# Patient Record
Sex: Male | Born: 1960 | Race: White | Hispanic: No | Smoking: Former smoker
Health system: Southern US, Community
[De-identification: ages and names within clinical notes are randomized; demographics above are authoritative.]

## PROBLEM LIST (undated history)

## (undated) DIAGNOSIS — I509 Heart failure, unspecified: Secondary | ICD-10-CM

## (undated) DIAGNOSIS — G459 Transient cerebral ischemic attack, unspecified: Secondary | ICD-10-CM

## (undated) DIAGNOSIS — I219 Acute myocardial infarction, unspecified: Secondary | ICD-10-CM

## (undated) DIAGNOSIS — G629 Polyneuropathy, unspecified: Secondary | ICD-10-CM

## (undated) DIAGNOSIS — J449 Chronic obstructive pulmonary disease, unspecified: Secondary | ICD-10-CM

## (undated) DIAGNOSIS — E1351 Other specified diabetes mellitus with diabetic peripheral angiopathy without gangrene: Secondary | ICD-10-CM

## (undated) DIAGNOSIS — G4733 Obstructive sleep apnea (adult) (pediatric): Secondary | ICD-10-CM

## (undated) DIAGNOSIS — R161 Splenomegaly, not elsewhere classified: Secondary | ICD-10-CM

## (undated) DIAGNOSIS — F329 Major depressive disorder, single episode, unspecified: Secondary | ICD-10-CM

## (undated) DIAGNOSIS — F32A Depression, unspecified: Secondary | ICD-10-CM

## (undated) DIAGNOSIS — M316 Other giant cell arteritis: Secondary | ICD-10-CM

## (undated) DIAGNOSIS — E1140 Type 2 diabetes mellitus with diabetic neuropathy, unspecified: Secondary | ICD-10-CM

## (undated) DIAGNOSIS — K7460 Unspecified cirrhosis of liver: Secondary | ICD-10-CM

## (undated) HISTORY — PX: AORTA - FEMORAL ARTERY BYPASS GRAFT: SUR173

## (undated) HISTORY — PX: CARDIAC CATHETERIZATION: SHX172

## (undated) SURGERY — Surgical Case
Anesthesia: *Unknown

---

## 1998-09-25 ENCOUNTER — Observation Stay: Admission: RE | Admit: 1998-09-25 | Discharge: 1998-09-26 | Payer: Self-pay | Admitting: *Deleted

## 2004-10-19 ENCOUNTER — Emergency Department: Payer: Self-pay | Admitting: Emergency Medicine

## 2005-07-06 ENCOUNTER — Other Ambulatory Visit: Payer: Self-pay

## 2005-07-06 ENCOUNTER — Inpatient Hospital Stay: Payer: Self-pay | Admitting: Internal Medicine

## 2005-10-26 ENCOUNTER — Encounter: Payer: Self-pay | Admitting: Internal Medicine

## 2005-11-10 ENCOUNTER — Encounter: Payer: Self-pay | Admitting: Internal Medicine

## 2006-01-26 ENCOUNTER — Emergency Department: Payer: Self-pay

## 2006-01-26 ENCOUNTER — Other Ambulatory Visit: Payer: Self-pay

## 2006-02-10 ENCOUNTER — Emergency Department: Payer: Self-pay | Admitting: Internal Medicine

## 2006-03-05 ENCOUNTER — Emergency Department: Payer: Self-pay | Admitting: Emergency Medicine

## 2006-03-18 ENCOUNTER — Ambulatory Visit: Payer: Self-pay | Admitting: Emergency Medicine

## 2006-09-15 ENCOUNTER — Emergency Department: Payer: Self-pay | Admitting: Emergency Medicine

## 2006-09-27 ENCOUNTER — Ambulatory Visit: Payer: Self-pay | Admitting: Family Medicine

## 2006-10-10 ENCOUNTER — Ambulatory Visit: Payer: Self-pay | Admitting: Family Medicine

## 2007-09-27 ENCOUNTER — Emergency Department: Payer: Self-pay | Admitting: Emergency Medicine

## 2007-11-05 ENCOUNTER — Other Ambulatory Visit: Payer: Self-pay

## 2007-11-06 ENCOUNTER — Inpatient Hospital Stay: Payer: Self-pay | Admitting: Internal Medicine

## 2008-03-27 ENCOUNTER — Ambulatory Visit: Payer: Self-pay | Admitting: Internal Medicine

## 2008-06-10 ENCOUNTER — Emergency Department: Payer: Self-pay

## 2008-10-14 ENCOUNTER — Emergency Department: Payer: Self-pay | Admitting: Emergency Medicine

## 2008-11-01 ENCOUNTER — Encounter: Payer: Self-pay | Admitting: Orthopedic Surgery

## 2008-11-10 ENCOUNTER — Encounter: Payer: Self-pay | Admitting: Orthopedic Surgery

## 2009-09-15 ENCOUNTER — Emergency Department: Payer: Self-pay | Admitting: Emergency Medicine

## 2009-10-17 ENCOUNTER — Ambulatory Visit: Payer: Self-pay | Admitting: Internal Medicine

## 2009-12-20 ENCOUNTER — Inpatient Hospital Stay: Payer: Self-pay | Admitting: Internal Medicine

## 2010-03-12 ENCOUNTER — Observation Stay: Payer: Self-pay | Admitting: Internal Medicine

## 2010-11-26 ENCOUNTER — Emergency Department: Payer: Self-pay | Admitting: Emergency Medicine

## 2010-12-02 ENCOUNTER — Emergency Department: Payer: Self-pay | Admitting: Emergency Medicine

## 2010-12-04 ENCOUNTER — Emergency Department: Payer: Self-pay | Admitting: Emergency Medicine

## 2010-12-25 ENCOUNTER — Emergency Department: Payer: Self-pay | Admitting: Unknown Physician Specialty

## 2011-01-30 ENCOUNTER — Emergency Department: Payer: Self-pay | Admitting: Emergency Medicine

## 2011-05-07 ENCOUNTER — Ambulatory Visit: Payer: Self-pay | Admitting: "Endocrinology

## 2011-05-21 ENCOUNTER — Emergency Department: Payer: Self-pay | Admitting: Emergency Medicine

## 2011-07-03 ENCOUNTER — Emergency Department: Payer: Self-pay | Admitting: Emergency Medicine

## 2011-08-12 ENCOUNTER — Observation Stay: Payer: Self-pay | Admitting: Internal Medicine

## 2011-08-12 LAB — CBC
HCT: 43.7 % (ref 40.0–52.0)
HGB: 15.3 g/dL (ref 13.0–18.0)
MCH: 32.8 pg (ref 26.0–34.0)
MCHC: 35 g/dL (ref 32.0–36.0)
MCV: 94 fL (ref 80–100)
RDW: 14.5 % (ref 11.5–14.5)

## 2011-08-12 LAB — CK TOTAL AND CKMB (NOT AT ARMC)
CK, Total: 44 U/L (ref 35–232)
CK-MB: <0.5 ng/mL — ABNORMAL LOW (ref 0.5–3.6)
CK-MB: <0.5 ng/mL — ABNORMAL LOW (ref 0.5–3.6)

## 2011-08-12 LAB — COMPREHENSIVE METABOLIC PANEL
Albumin: 4 g/dL (ref 3.4–5.0)
Alkaline Phosphatase: 84 U/L (ref 50–136)
Anion Gap: 10 (ref 7–16)
BUN: 20 mg/dL — ABNORMAL HIGH (ref 7–18)
Calcium, Total: 9.7 mg/dL (ref 8.5–10.1)
EGFR (Non-African Amer.): >60
Glucose: 289 mg/dL — ABNORMAL HIGH (ref 65–99)
Osmolality: 285 (ref 275–301)
Potassium: 4.1 mmol/L (ref 3.5–5.1)
SGOT(AST): 98 U/L — ABNORMAL HIGH (ref 15–37)
Sodium: 136 mmol/L (ref 136–145)
Total Protein: 8.3 g/dL — ABNORMAL HIGH (ref 6.4–8.2)

## 2011-08-12 LAB — URINALYSIS, COMPLETE
Bacteria: NONE SEEN
Bilirubin,UR: NEGATIVE
Blood: NEGATIVE
Glucose,UR: >=500 mg/dL (ref 0–75)
Hyaline Cast: 1
Ketone: NEGATIVE
Leukocyte Esterase: NEGATIVE
Specific Gravity: 1.028 (ref 1.003–1.030)
Squamous Epithelial: <1
WBC UR: 1 /HPF (ref 0–5)

## 2011-08-12 LAB — TROPONIN I: Troponin-I: <0.02 ng/mL

## 2011-08-13 LAB — LIPID PANEL
Cholesterol: 137 mg/dL (ref 0–200)
HDL Cholesterol: 21 mg/dL — ABNORMAL LOW (ref 40–60)
Triglycerides: 362 mg/dL — ABNORMAL HIGH (ref 0–200)

## 2011-08-13 LAB — CBC WITH DIFFERENTIAL/PLATELET
Basophil %: 0.7 %
Eosinophil #: 0.5 10*3/uL (ref 0.0–0.7)
Eosinophil %: 7.4 %
Lymphocyte %: 23.7 %
MCV: 95 fL (ref 80–100)
Monocyte %: 8.8 %
Neutrophil #: 4.2 10*3/uL (ref 1.4–6.5)
Neutrophil %: 59.4 %
RBC: 4.34 10*6/uL — ABNORMAL LOW (ref 4.40–5.90)
RDW: 14.8 % — ABNORMAL HIGH (ref 11.5–14.5)
WBC: 7 10*3/uL (ref 3.8–10.6)

## 2011-08-13 LAB — BASIC METABOLIC PANEL
Anion Gap: 10 (ref 7–16)
BUN: 21 mg/dL — ABNORMAL HIGH (ref 7–18)
Calcium, Total: 9 mg/dL (ref 8.5–10.1)
Chloride: 103 mmol/L (ref 98–107)
Creatinine: 1.27 mg/dL (ref 0.60–1.30)
Glucose: 142 mg/dL — ABNORMAL HIGH (ref 65–99)
Osmolality: 283 (ref 275–301)
Potassium: 4.1 mmol/L (ref 3.5–5.1)

## 2011-08-13 LAB — TROPONIN I: Troponin-I: <0.02 ng/mL

## 2011-08-13 LAB — MAGNESIUM: Magnesium: 1.8 mg/dL

## 2011-08-13 LAB — HEMOGLOBIN A1C: Hemoglobin A1C: 7.2 % — ABNORMAL HIGH (ref 4.2–6.3)

## 2011-11-06 ENCOUNTER — Emergency Department: Payer: Self-pay | Admitting: Emergency Medicine

## 2011-11-06 LAB — COMPREHENSIVE METABOLIC PANEL
Albumin: 3.9 g/dL (ref 3.4–5.0)
Alkaline Phosphatase: 81 U/L (ref 50–136)
Anion Gap: 13 (ref 7–16)
BUN: 28 mg/dL — ABNORMAL HIGH (ref 7–18)
Bilirubin,Total: 0.6 mg/dL (ref 0.2–1.0)
Chloride: 103 mmol/L (ref 98–107)
Co2: 22 mmol/L (ref 21–32)
Creatinine: 1.38 mg/dL — ABNORMAL HIGH (ref 0.60–1.30)
EGFR (African American): >60
Glucose: 246 mg/dL — ABNORMAL HIGH (ref 65–99)
Osmolality: 289 (ref 275–301)
SGOT(AST): 65 U/L — ABNORMAL HIGH (ref 15–37)
SGPT (ALT): 87 U/L — ABNORMAL HIGH
Sodium: 138 mmol/L (ref 136–145)

## 2011-11-06 LAB — CK TOTAL AND CKMB (NOT AT ARMC)
CK, Total: 31 U/L — ABNORMAL LOW (ref 35–232)
CK-MB: <0.5 ng/mL — ABNORMAL LOW (ref 0.5–3.6)

## 2011-11-06 LAB — CBC
HCT: 41.3 % (ref 40.0–52.0)
HGB: 14.2 g/dL (ref 13.0–18.0)
MCH: 32.7 pg (ref 26.0–34.0)
MCV: 95 fL (ref 80–100)
RBC: 4.34 10*6/uL — ABNORMAL LOW (ref 4.40–5.90)
RDW: 15 % — ABNORMAL HIGH (ref 11.5–14.5)
WBC: 4.7 10*3/uL (ref 3.8–10.6)

## 2011-11-06 LAB — TROPONIN I
Troponin-I: <0.02 ng/mL
Troponin-I: <0.02 ng/mL

## 2011-11-19 ENCOUNTER — Emergency Department: Payer: Self-pay | Admitting: Emergency Medicine

## 2011-11-19 LAB — BASIC METABOLIC PANEL
Anion Gap: 9 (ref 7–16)
BUN: 21 mg/dL — ABNORMAL HIGH (ref 7–18)
Chloride: 104 mmol/L (ref 98–107)
EGFR (African American): >60
EGFR (Non-African Amer.): >60
Glucose: 290 mg/dL — ABNORMAL HIGH (ref 65–99)
Osmolality: 289 (ref 275–301)
Potassium: 4.4 mmol/L (ref 3.5–5.1)
Sodium: 138 mmol/L (ref 136–145)

## 2011-11-19 LAB — CBC
HCT: 39.2 % — ABNORMAL LOW (ref 40.0–52.0)
HGB: 13.8 g/dL (ref 13.0–18.0)
MCH: 33.2 pg (ref 26.0–34.0)
MCHC: 35.3 g/dL (ref 32.0–36.0)
MCV: 94 fL (ref 80–100)
Platelet: 133 10*3/uL — ABNORMAL LOW (ref 150–440)
WBC: 6.8 10*3/uL (ref 3.8–10.6)

## 2011-11-19 LAB — TROPONIN I: Troponin-I: <0.02 ng/mL

## 2012-01-22 ENCOUNTER — Emergency Department: Payer: Self-pay | Admitting: Emergency Medicine

## 2012-01-22 LAB — BASIC METABOLIC PANEL
Anion Gap: 10 (ref 7–16)
BUN: 20 mg/dL — ABNORMAL HIGH (ref 7–18)
Calcium, Total: 9.5 mg/dL (ref 8.5–10.1)
Chloride: 103 mmol/L (ref 98–107)
Co2: 26 mmol/L (ref 21–32)
EGFR (African American): >60
EGFR (Non-African Amer.): >60
Glucose: 204 mg/dL — ABNORMAL HIGH (ref 65–99)
Osmolality: 286 (ref 275–301)
Sodium: 139 mmol/L (ref 136–145)

## 2012-01-22 LAB — CBC
HCT: 40.9 % (ref 40.0–52.0)
MCHC: 35.3 g/dL (ref 32.0–36.0)
MCV: 93 fL (ref 80–100)
Platelet: 147 10*3/uL — ABNORMAL LOW (ref 150–440)
RBC: 4.39 10*6/uL — ABNORMAL LOW (ref 4.40–5.90)
RDW: 14.7 % — ABNORMAL HIGH (ref 11.5–14.5)
WBC: 6.5 10*3/uL (ref 3.8–10.6)

## 2012-01-22 LAB — CK TOTAL AND CKMB (NOT AT ARMC): CK, Total: 41 U/L (ref 35–232)

## 2012-08-03 ENCOUNTER — Emergency Department: Payer: Self-pay | Admitting: Unknown Physician Specialty

## 2012-08-03 LAB — BASIC METABOLIC PANEL
Anion Gap: 11 (ref 7–16)
BUN: 23 mg/dL — ABNORMAL HIGH (ref 7–18)
Calcium, Total: 9.3 mg/dL (ref 8.5–10.1)
Co2: 23 mmol/L (ref 21–32)
Creatinine: 1.31 mg/dL — ABNORMAL HIGH (ref 0.60–1.30)
EGFR (African American): >60
EGFR (Non-African Amer.): >60
Osmolality: 286 (ref 275–301)
Sodium: 139 mmol/L (ref 136–145)

## 2012-08-03 LAB — CBC
HGB: 14.1 g/dL (ref 13.0–18.0)
MCH: 32.5 pg (ref 26.0–34.0)
MCV: 93 fL (ref 80–100)
Platelet: 136 10*3/uL — ABNORMAL LOW (ref 150–440)
RBC: 4.33 10*6/uL — ABNORMAL LOW (ref 4.40–5.90)
RDW: 14.9 % — ABNORMAL HIGH (ref 11.5–14.5)

## 2012-08-03 LAB — TROPONIN I: Troponin-I: <0.02 ng/mL

## 2012-08-03 LAB — CK TOTAL AND CKMB (NOT AT ARMC)
CK, Total: 58 U/L (ref 35–232)
CK-MB: <0.5 ng/mL — ABNORMAL LOW (ref 0.5–3.6)

## 2012-08-04 LAB — HEPATIC FUNCTION PANEL A (ARMC)
Albumin: 4.2 g/dL (ref 3.4–5.0)
Alkaline Phosphatase: 100 U/L (ref 50–136)
Bilirubin, Direct: 0.2 mg/dL (ref 0.00–0.20)
Bilirubin,Total: 0.8 mg/dL (ref 0.2–1.0)
SGPT (ALT): 74 U/L (ref 12–78)

## 2012-08-04 LAB — MAGNESIUM: Magnesium: 1.6 mg/dL — ABNORMAL LOW

## 2012-08-04 LAB — LIPASE, BLOOD: Lipase: 438 U/L — ABNORMAL HIGH (ref 73–393)

## 2013-04-12 ENCOUNTER — Emergency Department: Payer: Self-pay | Admitting: Emergency Medicine

## 2013-04-12 LAB — PROTIME-INR: Prothrombin Time: 13.5 secs (ref 11.5–14.7)

## 2013-04-12 LAB — TROPONIN I: Troponin-I: <0.02 ng/mL

## 2013-04-12 LAB — BASIC METABOLIC PANEL
BUN: 16 mg/dL (ref 7–18)
Chloride: 102 mmol/L (ref 98–107)
Co2: 24 mmol/L (ref 21–32)
EGFR (African American): >60
EGFR (Non-African Amer.): >60
Glucose: 293 mg/dL — ABNORMAL HIGH (ref 65–99)
Osmolality: 280 (ref 275–301)

## 2013-04-12 LAB — CBC
HCT: 41.7 % (ref 40.0–52.0)
HGB: 14.7 g/dL (ref 13.0–18.0)
MCH: 32.5 pg (ref 26.0–34.0)
MCHC: 35.2 g/dL (ref 32.0–36.0)
Platelet: 135 10*3/uL — ABNORMAL LOW (ref 150–440)
RBC: 4.51 10*6/uL (ref 4.40–5.90)
RDW: 14.9 % — ABNORMAL HIGH (ref 11.5–14.5)
WBC: 5.9 10*3/uL (ref 3.8–10.6)

## 2013-04-12 LAB — CK TOTAL AND CKMB (NOT AT ARMC)
CK, Total: 45 U/L (ref 35–232)
CK-MB: <0.5 ng/mL — ABNORMAL LOW (ref 0.5–3.6)

## 2013-04-12 LAB — PRO B NATRIURETIC PEPTIDE: B-Type Natriuretic Peptide: 64 pg/mL (ref 0–125)

## 2013-05-11 ENCOUNTER — Emergency Department: Payer: Self-pay | Admitting: Emergency Medicine

## 2013-05-11 LAB — COMPREHENSIVE METABOLIC PANEL
Alkaline Phosphatase: 143 U/L — ABNORMAL HIGH (ref 50–136)
BUN: 14 mg/dL (ref 7–18)
Bilirubin,Total: 1.2 mg/dL — ABNORMAL HIGH (ref 0.2–1.0)
Calcium, Total: 8.8 mg/dL (ref 8.5–10.1)
Co2: 24 mmol/L (ref 21–32)
EGFR (African American): >60
Glucose: 540 mg/dL (ref 65–99)
Potassium: 4 mmol/L (ref 3.5–5.1)
SGOT(AST): 65 U/L — ABNORMAL HIGH (ref 15–37)
SGPT (ALT): 96 U/L — ABNORMAL HIGH (ref 12–78)
Total Protein: 6.9 g/dL (ref 6.4–8.2)

## 2013-05-11 LAB — LIPASE, BLOOD: Lipase: 489 U/L — ABNORMAL HIGH (ref 73–393)

## 2013-05-11 LAB — CBC
HCT: 40 % (ref 40.0–52.0)
HGB: 14.5 g/dL (ref 13.0–18.0)
MCH: 33.6 pg (ref 26.0–34.0)
MCV: 93 fL (ref 80–100)
Platelet: 117 10*3/uL — ABNORMAL LOW (ref 150–440)
RBC: 4.31 10*6/uL — ABNORMAL LOW (ref 4.40–5.90)
RDW: 14.6 % — ABNORMAL HIGH (ref 11.5–14.5)

## 2013-05-11 LAB — URINALYSIS, COMPLETE
Blood: NEGATIVE
Glucose,UR: >=500 mg/dL (ref 0–75)
Ketone: NEGATIVE
Leukocyte Esterase: NEGATIVE
Protein: 30
RBC,UR: <1 /HPF (ref 0–5)
Squamous Epithelial: NONE SEEN
WBC UR: <1 /HPF (ref 0–5)

## 2013-07-23 ENCOUNTER — Emergency Department: Payer: Self-pay | Admitting: Emergency Medicine

## 2013-07-23 LAB — CBC
HCT: 41.7 % (ref 40.0–52.0)
HGB: 15 g/dL (ref 13.0–18.0)
MCH: 33.9 pg (ref 26.0–34.0)
MCHC: 36 g/dL (ref 32.0–36.0)
MCV: 94 fL (ref 80–100)
PLATELETS: 132 10*3/uL — AB (ref 150–440)
RBC: 4.43 10*6/uL (ref 4.40–5.90)
RDW: 15 % — ABNORMAL HIGH (ref 11.5–14.5)
WBC: 6.1 10*3/uL (ref 3.8–10.6)

## 2013-07-23 LAB — BASIC METABOLIC PANEL
Anion Gap: 4 — ABNORMAL LOW (ref 7–16)
BUN: 21 mg/dL — AB (ref 7–18)
Calcium, Total: 8.8 mg/dL (ref 8.5–10.1)
Chloride: 96 mmol/L — ABNORMAL LOW (ref 98–107)
Co2: 29 mmol/L (ref 21–32)
Creatinine: 1.3 mg/dL (ref 0.60–1.30)
EGFR (African American): >60
EGFR (Non-African Amer.): >60
Glucose: 444 mg/dL — ABNORMAL HIGH (ref 65–99)
OSMOLALITY: 281 (ref 275–301)
Potassium: 4.6 mmol/L (ref 3.5–5.1)
Sodium: 129 mmol/L — ABNORMAL LOW (ref 136–145)

## 2013-07-23 LAB — TROPONIN I

## 2013-10-02 LAB — BASIC METABOLIC PANEL
ANION GAP: 9 (ref 7–16)
BUN: 17 mg/dL (ref 7–18)
CHLORIDE: 98 mmol/L (ref 98–107)
CO2: 26 mmol/L (ref 21–32)
Calcium, Total: 9.1 mg/dL (ref 8.5–10.1)
Creatinine: 1.47 mg/dL — ABNORMAL HIGH (ref 0.60–1.30)
EGFR (African American): >60
EGFR (Non-African Amer.): 53 — ABNORMAL LOW
Glucose: 428 mg/dL — ABNORMAL HIGH (ref 65–99)
Osmolality: 286 (ref 275–301)
Potassium: 4.1 mmol/L (ref 3.5–5.1)
Sodium: 133 mmol/L — ABNORMAL LOW (ref 136–145)

## 2013-10-02 LAB — CBC
HCT: 43.2 % (ref 40.0–52.0)
HGB: 15 g/dL (ref 13.0–18.0)
MCH: 33.5 pg (ref 26.0–34.0)
MCHC: 34.7 g/dL (ref 32.0–36.0)
MCV: 96 fL (ref 80–100)
Platelet: 146 10*3/uL — ABNORMAL LOW (ref 150–440)
RBC: 4.48 10*6/uL (ref 4.40–5.90)
RDW: 15.8 % — ABNORMAL HIGH (ref 11.5–14.5)
WBC: 5.9 10*3/uL (ref 3.8–10.6)

## 2013-10-02 LAB — TROPONIN I: Troponin-I: <0.02 ng/mL

## 2013-10-03 ENCOUNTER — Observation Stay: Payer: Self-pay | Admitting: Internal Medicine

## 2013-10-03 LAB — CBC WITH DIFFERENTIAL/PLATELET
Basophil #: 0.1 10*3/uL (ref 0.0–0.1)
Basophil %: 0.8 %
Eosinophil #: 0.2 10*3/uL (ref 0.0–0.7)
Eosinophil %: 3.4 %
HCT: 41.1 % (ref 40.0–52.0)
HGB: 14.6 g/dL (ref 13.0–18.0)
Lymphocyte #: 1.7 10*3/uL (ref 1.0–3.6)
Lymphocyte %: 25.3 %
MCH: 33.8 pg (ref 26.0–34.0)
MCHC: 35.4 g/dL (ref 32.0–36.0)
MCV: 95 fL (ref 80–100)
MONO ABS: 0.5 x10 3/mm (ref 0.2–1.0)
MONOS PCT: 7.5 %
Neutrophil #: 4.2 10*3/uL (ref 1.4–6.5)
Neutrophil %: 63 %
PLATELETS: 141 10*3/uL — AB (ref 150–440)
RBC: 4.31 10*6/uL — AB (ref 4.40–5.90)
RDW: 15.4 % — AB (ref 11.5–14.5)
WBC: 6.6 10*3/uL (ref 3.8–10.6)

## 2013-10-03 LAB — CK-MB
CK-MB: <0.5 ng/mL — ABNORMAL LOW (ref 0.5–3.6)
CK-MB: <0.5 ng/mL — ABNORMAL LOW (ref 0.5–3.6)
CK-MB: <0.5 ng/mL — ABNORMAL LOW (ref 0.5–3.6)

## 2013-10-03 LAB — TSH: Thyroid Stimulating Horm: 1.24 u[IU]/mL

## 2013-10-03 LAB — MAGNESIUM
MAGNESIUM: 1.6 mg/dL — AB
Magnesium: 1.5 mg/dL — ABNORMAL LOW

## 2013-10-03 LAB — HEMOGLOBIN A1C: Hemoglobin A1C: 7.5 % — ABNORMAL HIGH (ref 4.2–6.3)

## 2013-10-03 LAB — TROPONIN I

## 2013-11-05 ENCOUNTER — Emergency Department: Payer: Self-pay | Admitting: Emergency Medicine

## 2013-11-05 LAB — SEDIMENTATION RATE: ERYTHROCYTE SED RATE: 45 mm/h — AB (ref 0–20)

## 2013-12-14 ENCOUNTER — Inpatient Hospital Stay: Payer: Self-pay | Admitting: Internal Medicine

## 2013-12-14 LAB — CBC WITH DIFFERENTIAL/PLATELET
BASOS ABS: 0.1 10*3/uL (ref 0.0–0.1)
Basophil %: 0.5 %
EOS ABS: 0.3 10*3/uL (ref 0.0–0.7)
EOS PCT: 2.9 %
HCT: 43.7 % (ref 40.0–52.0)
HGB: 15 g/dL (ref 13.0–18.0)
LYMPHS ABS: 1.7 10*3/uL (ref 1.0–3.6)
Lymphocyte %: 15.9 %
MCH: 33 pg (ref 26.0–34.0)
MCHC: 34.4 g/dL (ref 32.0–36.0)
MCV: 96 fL (ref 80–100)
Monocyte #: 0.5 x10 3/mm (ref 0.2–1.0)
Monocyte %: 5 %
Neutrophil #: 8.3 10*3/uL — ABNORMAL HIGH (ref 1.4–6.5)
Neutrophil %: 75.7 %
PLATELETS: 132 10*3/uL — AB (ref 150–440)
RBC: 4.56 10*6/uL (ref 4.40–5.90)
RDW: 16.1 % — ABNORMAL HIGH (ref 11.5–14.5)
WBC: 11 10*3/uL — ABNORMAL HIGH (ref 3.8–10.6)

## 2013-12-14 LAB — BASIC METABOLIC PANEL
ANION GAP: 6 — AB (ref 7–16)
BUN: 30 mg/dL — ABNORMAL HIGH (ref 7–18)
CO2: 31 mmol/L (ref 21–32)
CREATININE: 1.21 mg/dL (ref 0.60–1.30)
Calcium, Total: 9.6 mg/dL (ref 8.5–10.1)
Chloride: 105 mmol/L (ref 98–107)
EGFR (Non-African Amer.): >60
Glucose: 106 mg/dL — ABNORMAL HIGH (ref 65–99)
Osmolality: 290 (ref 275–301)
Potassium: 4 mmol/L (ref 3.5–5.1)
Sodium: 142 mmol/L (ref 136–145)

## 2013-12-14 LAB — TROPONIN I
Troponin-I: <0.02 ng/mL
Troponin-I: <0.02 ng/mL

## 2013-12-14 LAB — PRO B NATRIURETIC PEPTIDE: B-Type Natriuretic Peptide: 212 pg/mL — ABNORMAL HIGH (ref 0–125)

## 2013-12-14 LAB — MAGNESIUM: Magnesium: 1.8 mg/dL

## 2013-12-15 LAB — BASIC METABOLIC PANEL
Anion Gap: 8 (ref 7–16)
BUN: 27 mg/dL — ABNORMAL HIGH (ref 7–18)
CHLORIDE: 100 mmol/L (ref 98–107)
CO2: 27 mmol/L (ref 21–32)
Calcium, Total: 9.1 mg/dL (ref 8.5–10.1)
Creatinine: 1.1 mg/dL (ref 0.60–1.30)
EGFR (African American): >60
Glucose: 255 mg/dL — ABNORMAL HIGH (ref 65–99)
Osmolality: 284 (ref 275–301)
POTASSIUM: 4.4 mmol/L (ref 3.5–5.1)
SODIUM: 135 mmol/L — AB (ref 136–145)

## 2013-12-15 LAB — T4, FREE: Free Thyroxine: 0.95 ng/dL (ref 0.76–1.46)

## 2013-12-15 LAB — HEMOGLOBIN A1C: Hemoglobin A1C: 8.3 % — ABNORMAL HIGH (ref 4.2–6.3)

## 2013-12-15 LAB — TSH: Thyroid Stimulating Horm: 0.176 u[IU]/mL — ABNORMAL LOW

## 2013-12-19 LAB — CULTURE, BLOOD (SINGLE)

## 2014-01-10 HISTORY — PX: OTHER SURGICAL HISTORY: SHX169

## 2014-02-06 ENCOUNTER — Emergency Department: Payer: Self-pay | Admitting: Emergency Medicine

## 2014-02-06 LAB — PRO B NATRIURETIC PEPTIDE: B-Type Natriuretic Peptide: 634 pg/mL — ABNORMAL HIGH (ref 0–125)

## 2014-02-06 LAB — URINALYSIS, COMPLETE
BILIRUBIN, UR: NEGATIVE
Bacteria: NONE SEEN
Blood: NEGATIVE
Glucose,UR: NEGATIVE mg/dL (ref 0–75)
Ketone: NEGATIVE
Leukocyte Esterase: NEGATIVE
Nitrite: NEGATIVE
PH: 6 (ref 4.5–8.0)
RBC,UR: NONE SEEN /HPF (ref 0–5)
SQUAMOUS EPITHELIAL: NONE SEEN
Specific Gravity: 1.016 (ref 1.003–1.030)
WBC UR: <1 /HPF (ref 0–5)

## 2014-02-06 LAB — BASIC METABOLIC PANEL
Anion Gap: 10 (ref 7–16)
BUN: 28 mg/dL — ABNORMAL HIGH (ref 7–18)
CALCIUM: 9.1 mg/dL (ref 8.5–10.1)
CHLORIDE: 101 mmol/L (ref 98–107)
CO2: 26 mmol/L (ref 21–32)
Creatinine: 1.01 mg/dL (ref 0.60–1.30)
EGFR (Non-African Amer.): >60
Glucose: 78 mg/dL (ref 65–99)
Osmolality: 278 (ref 275–301)
POTASSIUM: 4.2 mmol/L (ref 3.5–5.1)
Sodium: 137 mmol/L (ref 136–145)

## 2014-02-06 LAB — CBC
HCT: 37.4 % — AB (ref 40.0–52.0)
HGB: 12.9 g/dL — ABNORMAL LOW (ref 13.0–18.0)
MCH: 33.5 pg (ref 26.0–34.0)
MCHC: 34.6 g/dL (ref 32.0–36.0)
MCV: 97 fL (ref 80–100)
Platelet: 136 10*3/uL — ABNORMAL LOW (ref 150–440)
RBC: 3.87 10*6/uL — ABNORMAL LOW (ref 4.40–5.90)
RDW: 17.2 % — AB (ref 11.5–14.5)
WBC: 10.8 10*3/uL — ABNORMAL HIGH (ref 3.8–10.6)

## 2014-02-06 LAB — TROPONIN I

## 2014-02-17 ENCOUNTER — Observation Stay: Payer: Self-pay | Admitting: Internal Medicine

## 2014-02-17 LAB — BASIC METABOLIC PANEL
Anion Gap: 11 (ref 7–16)
BUN: 28 mg/dL — AB (ref 7–18)
CHLORIDE: 105 mmol/L (ref 98–107)
Calcium, Total: 9.3 mg/dL (ref 8.5–10.1)
Co2: 24 mmol/L (ref 21–32)
Creatinine: 1.22 mg/dL (ref 0.60–1.30)
EGFR (Non-African Amer.): >60
Glucose: 137 mg/dL — ABNORMAL HIGH (ref 65–99)
OSMOLALITY: 287 (ref 275–301)
Potassium: 3.6 mmol/L (ref 3.5–5.1)
SODIUM: 140 mmol/L (ref 136–145)

## 2014-02-17 LAB — HEMOGLOBIN
HGB: 9.8 g/dL — ABNORMAL LOW (ref 13.0–18.0)
HGB: 10.2 g/dL — ABNORMAL LOW (ref 13.0–18.0)

## 2014-02-17 LAB — CBC
HCT: 31.6 % — ABNORMAL LOW (ref 40.0–52.0)
HGB: 10.4 g/dL — ABNORMAL LOW (ref 13.0–18.0)
MCH: 32.4 pg (ref 26.0–34.0)
MCHC: 33 g/dL (ref 32.0–36.0)
MCV: 98 fL (ref 80–100)
PLATELETS: 134 10*3/uL — AB (ref 150–440)
RBC: 3.22 10*6/uL — AB (ref 4.40–5.90)
RDW: 18.3 % — AB (ref 11.5–14.5)
WBC: 7.4 10*3/uL (ref 3.8–10.6)

## 2014-02-17 LAB — TROPONIN I: Troponin-I: <0.02 ng/mL

## 2014-02-18 LAB — CBC WITH DIFFERENTIAL/PLATELET
BASOS ABS: 0 10*3/uL (ref 0.0–0.1)
BASOS PCT: 0.1 %
Eosinophil #: 0 10*3/uL (ref 0.0–0.7)
Eosinophil %: 0.1 %
HCT: 30.8 % — AB (ref 40.0–52.0)
HGB: 10.5 g/dL — ABNORMAL LOW (ref 13.0–18.0)
LYMPHS ABS: 0.5 10*3/uL — AB (ref 1.0–3.6)
Lymphocyte %: 6.7 %
MCH: 33 pg (ref 26.0–34.0)
MCHC: 34.1 g/dL (ref 32.0–36.0)
MCV: 97 fL (ref 80–100)
Monocyte #: 0.3 x10 3/mm (ref 0.2–1.0)
Monocyte %: 4.8 %
NEUTROS PCT: 88.3 %
Neutrophil #: 6.3 10*3/uL (ref 1.4–6.5)
PLATELETS: 116 10*3/uL — AB (ref 150–440)
RBC: 3.18 10*6/uL — ABNORMAL LOW (ref 4.40–5.90)
RDW: 18.4 % — ABNORMAL HIGH (ref 11.5–14.5)
WBC: 7.1 10*3/uL (ref 3.8–10.6)

## 2014-02-18 LAB — BASIC METABOLIC PANEL
Anion Gap: 11 (ref 7–16)
BUN: 21 mg/dL — ABNORMAL HIGH (ref 7–18)
CALCIUM: 9.3 mg/dL (ref 8.5–10.1)
Chloride: 102 mmol/L (ref 98–107)
Co2: 27 mmol/L (ref 21–32)
Creatinine: 1.12 mg/dL (ref 0.60–1.30)
EGFR (African American): >60
Glucose: 135 mg/dL — ABNORMAL HIGH (ref 65–99)
OSMOLALITY: 284 (ref 275–301)
Potassium: 4.3 mmol/L (ref 3.5–5.1)
SODIUM: 140 mmol/L (ref 136–145)

## 2014-03-15 ENCOUNTER — Emergency Department: Payer: Self-pay | Admitting: Emergency Medicine

## 2014-03-15 LAB — BASIC METABOLIC PANEL
ANION GAP: 10 (ref 7–16)
BUN: 13 mg/dL (ref 7–18)
CALCIUM: 9 mg/dL (ref 8.5–10.1)
CO2: 23 mmol/L (ref 21–32)
CREATININE: 1.36 mg/dL — AB (ref 0.60–1.30)
Chloride: 105 mmol/L (ref 98–107)
EGFR (Non-African Amer.): 58 — ABNORMAL LOW
GLUCOSE: 269 mg/dL — AB (ref 65–99)
Osmolality: 285 (ref 275–301)
Potassium: 4.5 mmol/L (ref 3.5–5.1)
Sodium: 138 mmol/L (ref 136–145)

## 2014-03-15 LAB — CBC
HCT: 37.8 % — AB (ref 40.0–52.0)
HGB: 12.4 g/dL — ABNORMAL LOW (ref 13.0–18.0)
MCH: 31.2 pg (ref 26.0–34.0)
MCHC: 32.9 g/dL (ref 32.0–36.0)
MCV: 95 fL (ref 80–100)
PLATELETS: 166 10*3/uL (ref 150–440)
RBC: 3.98 10*6/uL — ABNORMAL LOW (ref 4.40–5.90)
RDW: 17.1 % — ABNORMAL HIGH (ref 11.5–14.5)
WBC: 9 10*3/uL (ref 3.8–10.6)

## 2014-03-15 LAB — PRO B NATRIURETIC PEPTIDE: B-Type Natriuretic Peptide: 530 pg/mL — ABNORMAL HIGH (ref 0–125)

## 2014-03-15 LAB — TROPONIN I: Troponin-I: 0.02 ng/mL

## 2014-04-21 ENCOUNTER — Emergency Department: Payer: Self-pay | Admitting: Emergency Medicine

## 2014-04-29 ENCOUNTER — Emergency Department: Payer: Self-pay | Admitting: Emergency Medicine

## 2014-04-29 LAB — CBC
HCT: 36.9 % — ABNORMAL LOW (ref 40.0–52.0)
HGB: 12.5 g/dL — AB (ref 13.0–18.0)
MCH: 31.1 pg (ref 26.0–34.0)
MCHC: 33.9 g/dL (ref 32.0–36.0)
MCV: 92 fL (ref 80–100)
PLATELETS: 163 10*3/uL (ref 150–440)
RBC: 4.01 10*6/uL — ABNORMAL LOW (ref 4.40–5.90)
RDW: 16 % — ABNORMAL HIGH (ref 11.5–14.5)
WBC: 11 10*3/uL — ABNORMAL HIGH (ref 3.8–10.6)

## 2014-04-29 LAB — COMPREHENSIVE METABOLIC PANEL
ALBUMIN: 3.3 g/dL — AB (ref 3.4–5.0)
ALK PHOS: 73 U/L
Anion Gap: 11 (ref 7–16)
BUN: 22 mg/dL — ABNORMAL HIGH (ref 7–18)
Bilirubin,Total: 1.2 mg/dL — ABNORMAL HIGH (ref 0.2–1.0)
Calcium, Total: 8.7 mg/dL (ref 8.5–10.1)
Chloride: 103 mmol/L (ref 98–107)
Co2: 24 mmol/L (ref 21–32)
Creatinine: 1.39 mg/dL — ABNORMAL HIGH (ref 0.60–1.30)
EGFR (Non-African Amer.): 56 — ABNORMAL LOW
Glucose: 299 mg/dL — ABNORMAL HIGH (ref 65–99)
Osmolality: 290 (ref 275–301)
Potassium: 4.1 mmol/L (ref 3.5–5.1)
SGOT(AST): 24 U/L (ref 15–37)
SGPT (ALT): 45 U/L
Sodium: 138 mmol/L (ref 136–145)
Total Protein: 6.7 g/dL (ref 6.4–8.2)

## 2014-04-29 LAB — TROPONIN I

## 2014-04-30 LAB — URINALYSIS, COMPLETE
BACTERIA: NONE SEEN
BILIRUBIN, UR: NEGATIVE
BLOOD: NEGATIVE
Glucose,UR: >=500 mg/dL (ref 0–75)
Ketone: NEGATIVE
Leukocyte Esterase: NEGATIVE
Nitrite: NEGATIVE
Ph: 6 (ref 4.5–8.0)
RBC,UR: NONE SEEN /HPF (ref 0–5)
SQUAMOUS EPITHELIAL: NONE SEEN
Specific Gravity: 1.016 (ref 1.003–1.030)

## 2014-04-30 LAB — ETHANOL: Ethanol: <3 mg/dL (ref 0–80)

## 2014-06-23 ENCOUNTER — Emergency Department: Payer: Self-pay | Admitting: Emergency Medicine

## 2014-06-23 LAB — BASIC METABOLIC PANEL
Anion Gap: 9 (ref 7–16)
BUN: 15 mg/dL (ref 7–18)
CALCIUM: 9.4 mg/dL (ref 8.5–10.1)
CHLORIDE: 103 mmol/L (ref 98–107)
Co2: 26 mmol/L (ref 21–32)
Creatinine: 1.31 mg/dL — ABNORMAL HIGH (ref 0.60–1.30)
EGFR (African American): >60
EGFR (Non-African Amer.): >60
Glucose: 350 mg/dL — ABNORMAL HIGH (ref 65–99)
OSMOLALITY: 290 (ref 275–301)
Potassium: 4.2 mmol/L (ref 3.5–5.1)
SODIUM: 138 mmol/L (ref 136–145)

## 2014-06-23 LAB — CBC WITH DIFFERENTIAL/PLATELET
Basophil #: 0.1 10*3/uL (ref 0.0–0.1)
Basophil %: 0.6 %
Eosinophil #: 0.2 10*3/uL (ref 0.0–0.7)
Eosinophil %: 1.8 %
HCT: 41.7 % (ref 40.0–52.0)
HGB: 13.9 g/dL (ref 13.0–18.0)
LYMPHS PCT: 12.7 %
Lymphocyte #: 1.2 10*3/uL (ref 1.0–3.6)
MCH: 31.6 pg (ref 26.0–34.0)
MCHC: 33.3 g/dL (ref 32.0–36.0)
MCV: 95 fL (ref 80–100)
MONO ABS: 0.6 x10 3/mm (ref 0.2–1.0)
MONOS PCT: 6.7 %
Neutrophil #: 7.3 10*3/uL — ABNORMAL HIGH (ref 1.4–6.5)
Neutrophil %: 78.2 %
Platelet: 167 10*3/uL (ref 150–440)
RBC: 4.39 10*6/uL — AB (ref 4.40–5.90)
RDW: 16.5 % — ABNORMAL HIGH (ref 11.5–14.5)
WBC: 9.3 10*3/uL (ref 3.8–10.6)

## 2014-06-23 LAB — URINALYSIS, COMPLETE
BACTERIA: NONE SEEN
Bilirubin,UR: NEGATIVE
Blood: NEGATIVE
LEUKOCYTE ESTERASE: NEGATIVE
Nitrite: NEGATIVE
Ph: 5 (ref 4.5–8.0)
Protein: 100
RBC,UR: <1 /HPF (ref 0–5)
Specific Gravity: 1.032 (ref 1.003–1.030)
Squamous Epithelial: NONE SEEN
WBC UR: <1 /HPF (ref 0–5)

## 2014-06-23 LAB — TROPONIN I: Troponin-I: <0.02 ng/mL

## 2014-09-20 ENCOUNTER — Emergency Department: Payer: Self-pay | Admitting: Emergency Medicine

## 2014-10-12 DIAGNOSIS — G459 Transient cerebral ischemic attack, unspecified: Secondary | ICD-10-CM

## 2014-10-12 HISTORY — DX: Transient cerebral ischemic attack, unspecified: G45.9

## 2014-11-01 ENCOUNTER — Emergency Department
Admit: 2014-11-01 | Disposition: A | Discharge status: Home / Self Care | Payer: Self-pay | Admitting: Emergency Medicine

## 2014-11-01 LAB — URINALYSIS, COMPLETE
BILIRUBIN, UR: NEGATIVE
BLOOD: NEGATIVE
Bacteria: NONE SEEN
KETONE: NEGATIVE
Leukocyte Esterase: NEGATIVE
Nitrite: NEGATIVE
Ph: 5 (ref 4.5–8.0)
SPECIFIC GRAVITY: 1.022 (ref 1.003–1.030)
SQUAMOUS EPITHELIAL: NONE SEEN

## 2014-11-01 LAB — CBC WITH DIFFERENTIAL/PLATELET
Basophil #: 0.1 10*3/uL (ref 0.0–0.1)
Basophil %: 0.5 %
EOS PCT: 2.1 %
Eosinophil #: 0.3 10*3/uL (ref 0.0–0.7)
HCT: 43.8 % (ref 40.0–52.0)
HGB: 14.9 g/dL (ref 13.0–18.0)
LYMPHS ABS: 1.4 10*3/uL (ref 1.0–3.6)
Lymphocyte %: 11.4 %
MCH: 31.3 pg (ref 26.0–34.0)
MCHC: 34.1 g/dL (ref 32.0–36.0)
MCV: 92 fL (ref 80–100)
MONOS PCT: 7 %
Monocyte #: 0.8 x10 3/mm (ref 0.2–1.0)
NEUTROS PCT: 79 %
Neutrophil #: 9.4 10*3/uL — ABNORMAL HIGH (ref 1.4–6.5)
Platelet: 206 10*3/uL (ref 150–440)
RBC: 4.76 10*6/uL (ref 4.40–5.90)
RDW: 15.4 % — ABNORMAL HIGH (ref 11.5–14.5)
WBC: 11.9 10*3/uL — ABNORMAL HIGH (ref 3.8–10.6)

## 2014-11-01 LAB — BASIC METABOLIC PANEL
ANION GAP: 11 (ref 7–16)
BUN: 14 mg/dL
CALCIUM: 9.6 mg/dL
Chloride: 100 mmol/L — ABNORMAL LOW
Co2: 26 mmol/L
Creatinine: 1.09 mg/dL
EGFR (African American): >60
EGFR (Non-African Amer.): >60
Glucose: 250 mg/dL — ABNORMAL HIGH
Potassium: 3.7 mmol/L
SODIUM: 137 mmol/L

## 2014-11-02 NOTE — Consult Note (Signed)
PATIENT NAME:  Larry Terrell, Larry Terrell MR#:  [id number] DATE OF BIRTH:  03/26/1961  DATE OF CONSULTATION:  04/12/2013  CONSULTING PHYSICIAN:  Kyro Joswick A. Posey Pronto, MD  PRIMARY CARE PHYSICIAN:  Dr. Lovie Macadamia, [facility], and the patient also has [facility] internal medicine.      CARDIOLOGY:  [facility] cardiology.   CHIEF COMPLAINT: Chest pain and left shoulder pain for 1 week.   Larry Terrell is a 54 year old obese Caucasian gentleman with history of CAD, status post stent a few times at [facility], most last recent one was about a year ago; hypertension, diabetes and obesity.   He comes to the Emergency Room after he started noticing chest pain, along with some left shoulder pain on and off for the past 1 week. He has been taking sublingual nitro along with all of his cardiac and diabetic meds. The patient states he did have some left shoulder constant pain. Came to the Emergency Room where he was evaluated for chest pain. He remained hemodynamically stable. His cardiac enzymes x 2 were negative. His EKG also was done, along with repeat EKG, which remained essentially negative for acute MI. The patient was then given IV morphine, which eased him off his pain, and he also received an additional dose of IV Toradol 30 mg  x 1. The patient is requesting to go home.   PAST MEDICAL HISTORY: 1.  Coronary artery disease, status post stent in the past.  2.  Cirrhosis of liver.  3.  Hypertension.  4.  Diabetes.  5.  Depression.  6.  CAD, status post stent in the past.  7.  Peripheral vascular disease with fem-pop bypass.   ALLERGIES: No known drug allergies.   HOME MEDICATIONS: 1.  Zoloft 100 mg b.i.d.  2.  Metoprolol 50 mg b.i.d.  3.  Hydrochlorothiazide 12.5 mg daily.  4.  Glipizide 5 mg daily.  5.  Gabapentin 300 mg b.i.d.  6.  Flovent 110 mcg per inhalation 1 puff b.i.d.  7.  Effient 10 mg daily.  8.  Doxazosin 4 mg daily.  9.  Crestor 5 mg daily at bedtime.  10.  Aspirin 81 mg daily.   FAMILY HISTORY: Positive for CAD in  mother.   SOCIAL HISTORY: He is an ex-smoker, on disability, and lives at home with his wife.   REVIEW OF SYSTEMS:  CONSTITUTIONAL: No fever, fatigue, weakness. Positive for left shoulder pain.  EYES: No blurred or double vision, redness or inflammation.  EARS, NOSE, THROAT: No tinnitus, ear pain, hearing loss or postnasal drip.  RESPIRATORY: No cough, wheeze, hemoptysis or COPD.  CARDIOVASCULAR: Positive for some chest pain, however, resolved at this time. No syncope, edema or arrhythmia.  GASTROINTESTINAL: No nausea, vomiting, diarrhea, abdominal pain. No GERD. GENITOURINARY:  No dysuria, hematuria, renal calculus or frequency.  ENDOCRINE: No polyuria, nocturia or thyroid problems.  HEMATOLOGY: No anemia, easy bruising or bleeding.  SKIN: No acne, rash or any lesions.  MUSCULOSKELETAL: Positive for shoulder pain. No swelling or gout.  NEUROLOGIC: No tremor, vertigo, CVA or TIA.  PSYCHIATRIC: Positive for depression. No anxiety, insomnia or bipolar disorder. All other systems reviewed and are negative.   PHYSICAL EXAMINATION: GENERAL: The patient is awake, alert, oriented x 3, not in acute distress.  VITAL SIGNS: Afebrile. Pulse is 84. Blood pressure is 163/79. Sats are 94% on room air.  HEENT: Atraumatic, normocephalic. Pupils: PERRLA. EOM intact. Oral mucosa is moist.  NECK: Supple. No JVD. No carotid bruit.  LUNGS: Clear to auscultation bilaterally. No  rales, rhonchi, respiratory distress or labored breathing.  HEART: Both the heart sounds are normal. Rate, rhythm is regular. PMI not lateralized. Chest nontender.  EXTREMITIES: Good pedal pulses, good femoral pulses. No lower extremity edema.  ABDOMEN: Soft, benign, nontender. No organomegaly. Positive bowel sounds.  NEUROLOGIC: Grossly intact cranial nerves II through XII. No motor or sensory deficit.  PSYCHIATRIC:  The patient is awake, alert, oriented x 3.   EKG shows normal sinus rhythm with Q waves in inferior lead, which  appears to be old.   Troponin x 2 is less than 0.02.   CHEST X-RAY: No acute cardiopulmonary abnormality.   CBC within normal limits with platelet count of 135.   Comprehensive metabolic panel within normal limits except glucose of 293 and sodium of 134. B-type natriuretic peptide is 64.   ASSESSMENT AND PLAN: A 54 year old Larry Terrell with history of coronary artery disease, hypertension, diabetes, comes in with:  1.  Chest pain on and off with left shoulder pain for 1 week. The patient has been taking sublingual nitro with intermittent relief. He is currently chest pain-free in the Emergency Room, however, does have some left shoulder discomfort, which is not relieved with nitroglycerin. I gave him morphine. Did help him relieve some pain. The patient is okay to try a dose of IV Toradol. Blood pressure has been stable. EKG x 2 no changes.  ER M.D. spoke with the patient's cardiologist at [facility]. He recommends to follow up as outpatient. The patient has been requesting to go home. We will prescribe him some p.o. Ultram which he will take a couple of times a day to see if that relieves his left shoulder pain. This could be DJD versus muscular. The patient is also asked to return to the Emergency Room if his symptoms worsen or continues to have severe chest pain. The patient and wife did voice understanding.  2.  Coronary artery disease status post stent in the past, done last year at [facility], 1 stent was placed.  3.  Hypertension. Continue home meds.  4.  Type 2 diabetes. Sugars are uncontrolled. The patient is recommended diet and exercise, along with p.o. meds.  5.  History of tobacco abuse in the past.  6.  Peripheral vascular disease. Continue home meds.  ER stay otherwise was stable. The patient is discharged to home.    TIME SPENT: 50 minutes.       ____________________________ Hart Rochester Posey Pronto, MD sap:dmm D: 04/12/2013 18:50:19 ET T: 04/12/2013 20:01:03 ET JOB#: [id number]  cc: Marzetta Lanza A. Posey Pronto,  MD, <Dictator> Ilda Basset MD ELECTRONICALLY SIGNED 05/05/2013 14:57

## 2014-11-03 NOTE — H&P (Signed)
PATIENT NAME:  Larry Terrell, Larry Terrell MR#:  [id number] DATE OF BIRTH:  1960-10-10  DATE OF ADMISSION:  02/17/2014  PRIMARY CARE PHYSICIAN: Dr. Boyd Kerbs at [facility].   CARDIOLOGIST: Dr. Saunders Revel at [facility].   CHIEF COMPLAINT: Leg pain.   HISTORY OF PRESENT ILLNESS: This is a 54 year old man who was just seen by Dr. Boyd Kerbs yesterday, found to be orthostatic. They cut back on his metoprolol. Today he got up, felt lightheaded, his knees collapse, and ended up on the ground. He does have a Holter monitor on him as outpatient, and his blood pressure has been dropping down when he has been getting up and walking. In the ER, he was found to have a hairline fracture of the fibula, and he was found to have a drop in hemoglobin. His hemoglobin a couple of months back was up at 15 in March, in July it was 12.9, and today 10.4. The patient was also found to be guaiac-positive in the ER. Hospitalist services were contacted for further evaluation for a drop in hemoglobin, suspected GI bleed.   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   PAST SURGICAL HISTORY: He had a MI with stents in the heart in July, bypass on the legs, cornea transplant.   ALLERGIES: No known drug allergies.   MEDICATIONS: Include albuterol CFC 2 puffs every 4 to 6 hours as needed for shortness of breath, aspirin 81 mg daily, atorvastatin 20 mg at bedtime, Celexa 40 mg daily, gabapentin 600 mg twice a day, losartan 25 mg 1/2 tablet daily, magnesium oxide 400 mg daily, metoprolol tartrate 25 mg twice a day, nitroglycerin 0.4 mg as needed for chest pain, 70/30 insulin 20 units subcutaneous injection twice a day, Protonix 40 mg daily, Effient 10 mg daily, prednisone 60 mg daily, recently cut back to 50, vitamin D3 at 1000 international units daily.   SOCIAL HISTORY: Quit smoking 10  years ago. No alcohol. No drug use. Is on disability.   FAMILY HISTORY: Father died of Alzheimer's and an MI. Mother died of stroke.   REVIEW OF SYSTEMS: CONSTITUTIONAL: Positive for weight gain, 8 pounds then lost 2 pounds. Positive for weakness on the left side. No fever, chills, or sweats.  EYES: Legally blind one eye.  EARS, NOSE, AND THROAT: Runny nose. Positive for a knot in the throat when swallowing.  CARDIOVASCULAR: No chest pain. No palpitations.  RESPIRATORY: Positive for shortness of breath with exertion. No cough. No sputum. No hemoptysis.  GASTROINTESTINAL: Positive for abdominal pain. No nausea. No vomiting. No diarrhea. No constipation. No bright red blood per rectum that is seen.  GENITOURINARY: No burning on urination or hematuria.  MUSCULOSKELETAL: Positive for low back pain.  INTEGUMENT: No rashes or eruptions.  NEUROLOGIC: No fainting or blackouts.  PSYCHIATRIC: No anxiety or depression.  HEMATOLOGIC: No anemia prior to this.   PHYSICAL EXAMINATION: VITAL SIGNS: On presentation to the Emergency Room, included a temperature of 97.9, pulse 94, respirations 20, blood pressure 135/75, pulse oximetry 96% on room air.  GENERAL: No respiratory distress.  EYES: Conjunctivae and lids normal. Pupils equal, round, and reactive to Mattila. Extraocular muscles intact. No nystagmus.  EARS, NOSE, MOUTH AND THROAT: Tympanic membranes: No erythema. Nasal mucosa: No erythema. Throat: No erythema, no exudate seen. Lips and gums: No lesions.  NECK: No JVD. No bruits. No lymphadenopathy. No thyromegaly. No thyroid nodules palpated.  RESPIRATORY: Lungs clear to auscultation. No use of accessory muscles to breathe. No rhonchi, rales, or wheeze heard.  CARDIOVASCULAR SYSTEM: S1, S2 normal. No gallops, rubs, or murmurs heard. Carotid upstroke 2+ bilaterally. No bruits.  EXTREMITIES: Dorsalis pedis pulses 1+ bilaterally, 2+ edema bilateral lower extremities.  ABDOMEN: Soft, slight tenderness in  the epigastric area. No organomegaly/splenomegaly. Normoactive bowel sounds. No masses felt.  LYMPHATIC: No lymph nodes in the neck.  MUSCULOSKELETAL: With 2+ edema. No clubbing. No cyanosis.  SKIN: No ulcers or lesions.  NEUROLOGIC: Cranial nerves II through XII grossly intact. Deep tendon reflexes 1+ bilateral lower extremities. Power 4/5 left extremity, 4+/5 left upper and lower extremity, 5/5 right upper and lower extremity.  PSYCHIATRIC: The patient is oriented to person, place, and time.   LABORATORY AND RADIOLOGICAL DATA: Hemoglobin 10.4, white blood cell count 7.4, platelet count 134. Glucose 137, BUN 28, creatinine 1.22, sodium 140, potassium 3.6, chloride 105, CO2 of 24, calcium 9.3. Troponin negative. Tibia and fibula on the right showed a nondisplaced fracture of the proximal fibular shaft. Chest x-ray showed stable cardiomegaly and chronic lung disease. EKG: Normal sinus rhythm, low voltage, Q waves inferiorly.   ASSESSMENT AND PLAN: 1.  Suspected gastrointestinal bleed with drop in hemoglobin and guaiac-positive stool. Will hold aspirin and Effient today. Likely, will have to continue tomorrow. Serial hemoglobins, will give IV Protonix stat and b.i.d. Case was discussed with Dr. Gustavo Lah to see the patient. Will put the patient on a full liquid diet at this point.  2.  Orthostatic hypotension and syncope. Will cut back on metoprolol. We will give it once a day starting tomorrow, Toprol-XL, and hold Lasix today.  3.  Recent myocardial infarction and stent. Will hold aspirin and Effient today. Watch with serial hemoglobins.  4.  Fibula fracture. Will get an orthopedic consultation.  5.  Weakness on the left side. His medical doctor thought he may have had a stroke. He is already on Effient and aspirin. I will get an ultrasound of the carotids and CT scan of the head.  6.  Temporal arteritis, on lower dose prednisone 50 mg.  7.  Diabetes. Will put on sliding scale since on liquid diet.   8.  History of congestive heart failure. No current signs now. We will hold off on IV fluids and hold off on the Lasix for right now.  9.  Nonalcoholic steatohepatitis.   TIME SPENT ON PATIENT CARE: 55 minutes.    ____________________________ Tana Conch. Leslye Peer, MD rjw:at D: 02/17/2014 15:12:37 ET T: 02/17/2014 15:35:03 ET JOB#: [id number]  cc: Tana Conch. Leslye Peer, MD, <Dictator> Dr. Saunders Revel Dr. Raelene Bott MD ELECTRONICALLY SIGNED 02/19/2014 11:49

## 2014-11-03 NOTE — H&P (Signed)
PATIENT NAME:  Larry Terrell, Larry Terrell MR#:  [id number] DATE OF BIRTH:  1960/08/25  DATE OF ADMISSION:  12/14/2013  PRIMARY CARE PHYSICIAN: Juluis Pitch, MD  REFERRING PHYSICIAN: Delman Kitten, MD  CHIEF COMPLAINT: Weakness while walking, fell today.   HISTORY OF PRESENT ILLNESS: This is a 54 year old Caucasian male with a history of temporal arteritis, OSA, CAD, hypertension, and diabetes who presented to the ED with the above chief complaint. The patient is alert, awake, and oriented, in no acute distress. The patient felt weak while walking this morning. He fell on his buttocks so he came to the ED for further evaluation. Actually, the patient has had a cough for the past 2 days with mild shortness of breath. The patient was diagnosed with bronchitis and sent back home. The patient was noted to have O2 saturation in the 80s, is being treated with oxygen, nebulizer and Solu-Medrol in the ED. The patient denies any fever or chills. Has mild headache and dizziness and generalized weakness. The patient denies any chest pain, palpitation, orthopnea, and nocturnal dyspnea. No leg edema. The patient appears to have OSA and is using 2 pillows to sleep. The patient is unable to lie flat.   PAST MEDICAL HISTORY:  1.  Temporal arteritis diagnosed 4 weeks ago, on prednisone 60 mg p.o. daily.  2.  OSA. 3.  CAD status post stent. 4.  Liver cirrhosis. 5.  Hypertension. 6.  Diabetes. 7.  Depression. 8.  PVD with femoral popliteal bypass.    SOCIAL HISTORY: No smoking or drinking or illicit drugs.   FAMILY HISTORY: Father has diabetes.   PAST SURGICAL HISTORY: Femoral popliteal bypass, cardiac stent and angioplasty.   ALLERGIES: No known drug allergies.  HOME MEDICATIONS:  1.  Zoloft 100 mg p.o. 2 tablets at bedtime. 2.  Tramadol 50 mg p.o. b.i.d.  3.  Rosuvastatin 5 mg p.o. daily.  4.  Prednisone 20 mg p.o. daily.  5.  Nitroglycerin 0.4 mg sublingual every 5 minutes p.r.n. for chest pain. 6.  Lopressor  100 mg p.o. b.i.d.  7.  Imdur 60 mg p.o. daily. 8.  Ibuprofen 600 mg every 8 hours p.r.n. 9.  Glipizide XL 10 mg p.o. daily. 10.  Gabapentin 300 mg 2 tabs b.i.d. 11.  Flovent HFA 110 mcg 2 puffs b.i.d.  12.  Cyclobenzaprine 10 mg p.o. t.i.d. p.r.n. for muscle spasm.  13.  Aspirin 81 mg p.o. daily. 14.  Albuterol CFC free 90 mcg inhalation 2 puffs every 4 to 6 hours p.r.n.   REVIEW OF SYSTEMS:  CONSTITUTIONAL: The patient denies any fever or chills, but has headache, dizziness and generalized weakness.  EYES: No double vision or blurry vision.  ENT: No postnasal drip, slurred speech or dysphagia.  CARDIOVASCULAR: No chest pain, palpitation, orthopnea, nocturnal dyspnea. No leg edema.  PULMONARY: Positive for cough, sputum, and shortness of breath, but no hemoptysis.  GASTROINTESTINAL: No abdominal pain, nausea, vomiting, diarrhea. No melena or bloody stool.  GENITOURINARY: No dysuria, hematuria, or incontinence.  SKIN: No rash or jaundice.  NEUROLOGY: No syncope, loss of consciousness, or seizure.  HEMATOLOGY: No easy bruising or bleeding.  ENDOCRINE: No polyuria, polydipsia, heat or cold intolerance.   PHYSICAL EXAMINATION: VITAL SIGNS: Blood pressure 140/65, pulse 78, O2 saturation 96% on oxygen.  GENERAL: The patient is alert, awake, and oriented, in no acute distress, obese.  HEENT: Pupils round, equal and reactive to Solinger and accommodation. Moist oral mucosa. Clear oropharynx.  NECK: Supple. No JVD or carotid bruit. No lymphadenopathy.  No thyromegaly.  CARDIOVASCULAR: S1, S2 regular rate and rhythm. No murmurs or gallops.  PULMONARY: Bilateral air entry. No wheezing but has some coarse breath sounds. No use of accessory muscles to breathe.  ABDOMEN: Soft, obese. Bowel sounds present. No distention or tenderness. No organomegaly. Bowel sounds present.  EXTREMITIES: No edema, clubbing or cyanosis. No calf tenderness. Bilateral pedal pulses present.  SKIN: No rash or jaundice.   NEUROLOGIC: Alert and oriented x3. No focal deficit. Power 5/5. Sensory intact.   DIAGNOSTIC DATA: Chest x-ray showed low volume chest congestion with mild cardiomegaly.  WBC 11, hemoglobin 15.3, platelets 132,000. Glucose 106, BUN 30, creatinine 1.21. Electrolytes are normal. Troponin less than 0.02. BNP 212.  EKG showed normal sinus rhythm at 75 beats per minute.   IMPRESSIONS: 1.  Chronic obstructive pulmonary disease exacerbation.  2.  Acute respiratory failure with hypoxia.  3.  Acute bronchitis.  4.  Dehydration.  5.  Hypertension.  6.  Diabetes.  7.  Obstructive sleep apnea.  8.  Obesity.  9.  Thrombocytopenia.  10.  Liver cirrhosis.  11.  Temporal arteritis.  PLAN OF TREATMENT: 1.  The patient will be admitted to medical floor. We will continue Solu-Medrol, DuoNeb, and Advair. Continue oxygen by nasal cannula.  2.  For dehydration, we will give normal saline. Follow BMP and magnesium level.  3.  Continue Lopressor and Imdur. 4.  For diabetes, we will start sliding scale but hold glipizide.   Discussed the patient's condition and plan of treatment with the patient and the patient's brother. The patient wants FULL code.   TIME SPENT: About 52 minutes.  ____________________________ Demetrios Loll, MD qc:sb D: 12/14/2013 13:40:18 ET T: 12/14/2013 14:04:34 ET JOB#: [id number]  cc: Demetrios Loll, MD, <Dictator> Demetrios Loll MD ELECTRONICALLY SIGNED 12/14/2013 16:05

## 2014-11-03 NOTE — Consult Note (Signed)
PATIENT NAME:  Larry Terrell, Larry Terrell MR#:  [id number] DATE OF BIRTH:  [birth year]  DATE OF CONSULTATION:  02/17/2014  REFERRING PHYSICIAN:   CONSULTING PHYSICIAN:  Lollie Sails, MD  REASON FOR CONSULTATION: Hemoccult-positive stool and drop of hemoglobin.   HISTORY OF PRESENT ILLNESS: Mr. Risdon is a 54 year old Caucasian male who came to the Emergency Room this morning after a fall at home. He fell at about 1:00 a.m. and he states that he went "straight down." He has been having shaking episodes at home since his myocardial infarction approximately 12/31/2013. He will become unsteady and then fall. When he fell at 1:00 a.m. this morning, he found he could not get back up. Subsequently, he was brought to the Emergency Room. He was found to have a fibular fracture of the right leg. He has that in an immobilizer currently. He was actually here at the hospital with an exacerbation of CHF about a week or so ago. He has a history of a myocardial infarction around 12/31/2013 and underwent placement of cardiac stent and angioplasty at [facility]. Subsequent to that, he was placed on Effient and aspirin combination. He states that currently he "feels okay." He states that he will occasionally get some nausea but this predates his myocardial infarction by at least several months. He does take a proton pump inhibitor daily over the past at least a year. He states that he does not have any vomiting with the nausea. He denies any heartburn or dysphagia. There has been no emesis or hematemesis. He states he does have some abdominal pain on the left side of the abdomen. This has been more so since his fall. He states that he has a bowel movement daily. He sees some small trace amount of blood on the toilet paper, this occurring "once in a blue moon." He has not seen any rectal bleeding or black bowel movements. He has no previous history of peptic ulcer disease. He did have a colonoscopy in about February or March 2015, that showing  several colon polyps that were removed at that time. He does not remember whether he was told there were any internal hemorrhoids.   GASTROINTESTINAL FAMILY HISTORY: Negative for colorectal cancer, liver disease, or ulcers. He has been hemodynamically stable. He has had no bowel movement since his admission.   PAST MEDICAL HISTORY:  1.  History of gastroesophageal reflux.  2.  History of diabetic neuropathy/nerve disorder.  3.  BPH.  4.  Chronic low back pain.  5.  Hypercholesterolemia and hypertriglyceridemia.  6.  History of temporal arteritis, currently taking prednisone.  7.  Obstructive sleep apnea. He does not use his CPAP at home as he should.  8.  Arthritis. 9.  CHF.  10.  Myocardial infarction and coronary artery stenting as above.  11.  History of cirrhosis/nonalcoholic fatty liver disease, followed at [facility] by Dr. Rayvon Char. He states that he is due to have re-evaluation/outpatient ultrasound for that soon. He has been followed for this problem at least a year at [facility].  12.  History of vascular disease with a femoropopliteal bypass several years ago.  13.  Diabetes mellitus.   OUTPATIENT MEDICATIONS: Include: Albuterol 90 mcg inhalation 2 puffs q. 4 to 6 hours as needed, 81 mg aspirin daily, atorvastatin 20 mg once a day, Citalopram 40 mg once a day, gabapentin 300 mg 2 capsules twice a day, losartan 25 mg 12.5 mg once a day, magnesium oxide 400 mg oral tablet once a day,  metoprolol 25 mg twice a day, nitroglycerin 0.4 mg sublingual tablet as directed p.r.n., Novolin 70/30 with 20 units subcutaneous twice a day, pantoprazole 40 mg once a day, prasugrel 10 mg once a day, prednisone 20 mg 3 tablets once a day, vitamin D3 at 1000 international units once a day.   ALLERGIES: He has no known drug allergies.   PHYSICAL EXAMINATION: VITAL SIGNS: Temperature is 97.9, pulse 82, respirations 16, blood pressure 129/83. He was not orthostatic on check. He is on 2 liters oxygen with  a pulse oximetry 95%.  GENERAL: He is an obese, 54 year old Caucasian male, no acute distress.  HEENT: Normocephalic, atraumatic.  EYES: Anicteric.  NOSE: Septum midline.  OROPHARYNX: No lesions.  NECK: No JVD.  HEART: Regular rate and rhythm.  LUNGS: Clear.  ABDOMEN: Soft, nontender, nondistended, obese. Bowel sounds are positive. He does show several bruises over the surface of the abdomen that he states is related to his insulin. However, he also shows several bruises over the lower back. These are bruises actually on both sides of his back, more so surface ecchymosis on the left side.  EXTREMITIES: He is able to move his left lower extremity without difficulty. The right lower extremity is in an immobilizer. The distal right leg does not show any evidence of ecchymosis. RECTAL: Anorectal exam deferred. He was apparently Hemoccult-positive in the ER.   LABORATORIES: Include the following: Early this morning he had a BUN of 28, glucose of 137, creatinine 1.22, sodium 140, potassium 3.6, chloride 105, bicarbonate 24, calcium 9.3. Troponin I x 1 less than 0.02. Hemogram showing a white count of 7.4, hemoglobin and hematocrit 10.4/31.6, platelet count 134. This was at 6:58 this morning. He did have a hemoglobin drawn at 1:15 this morning of 9.8. He has been typed and crossed. Again, he was not orthostatic. He had a CT scan of the head without contrast. This showed no acute intracranial abnormality. He had a chest PA and lateral film showing stable cardiomegaly and chronic lung disease. He had an image of the right lower leg showing a nondisplaced fracture of the proximal fibular shaft. He had carotid Dopplers for complaint of weakness on his left side, these showing atherosclerotic plaque in the carotid arteries bilaterally, right side greater than left. Estimated narrowing on the right internal carotid 50% to 69%, estimated on the left likely less than 50%, patent vertebrals, possible external carotid  arterial stenosis bilaterally.   ASSESSMENT: Hemoccult-positive stool and drop of hemoglobin as noted above. Baseline several weeks ago was 15 hemoglobin, this morning he was 10.4. Although he was Hemoccult positive on testing, he has not had melena. He has not had any gross rectal bleeding with the exception of a small flash on the toilet paper which he describes as "once in a blue moon." There has been no hematemesis. He did have a fall early this morning. There is not an overtly gross Damian Leavell sign, although he does show some bruising on his flanks bilaterally. There is no Cullen's sign. There does not appear to be any large amount of blood into the right leg. There is no distal ecchymosis. Clinically, the patient would be at high risk for stent closure/complication due to his recent cardiac stent. He is on both Effient, as well as mini dose aspirin. Anticoagulation would need to be stopped to do luminal evaluation. It is unlikely that he has developed problems in his stomach in terms of ulceration or gastritis, in that he has been on a  chronic/daily dose of proton pump inhibitor for quite some time. My concern would be more so alternative sources of blood loss such as retroperitoneal bleeding. He does have also a baseline problem of  nonalcoholic fatty liver disease/cirrhosis of the liver, as well as splenomegaly, which he is under the care of [facility] gastroenterology.   RECOMMENDATION: 1.  Serial hemoglobins.  2.  Increase the proton pump inhibitor to b.i.d.  3.  CT scan of the abdomen and pelvis, rule out retroperitoneal bleed.   Case discussed with Dr. Bobbye Charleston.    ____________________________ Lollie Sails, MD mus:at D: 02/17/2014 14:47:34 ET T: 02/17/2014 15:01:29 ET JOB#: [id number]  cc: Lollie Sails, MD, <Dictator> Lollie Sails MD ELECTRONICALLY SIGNED 03/15/2014 14:40

## 2014-11-03 NOTE — Discharge Summary (Signed)
PATIENT NAME:  Larry Terrell, Larry Terrell MR#:  [id number] DATE OF BIRTH:  [birth year]  DATE OF ADMISSION:  12/14/2013 DATE OF DISCHARGE:  12/15/2013  ADMISSION DIAGNOSIS: Acute respiratory failure from probable chronic obstructive pulmonary disease exacerbation.   DISCHARGE DIAGNOSES:  1.  Acute chronic obstructive pulmonary disease exacerbation from acute bronchitis with reactive airway disease.  2.  Acute bronchitis with reactive airway disease.  3.  Abnormal TSH with a normal T4, likely sick euthyroid.  4.  Recent diagnosis of temporal arteritis.  5.  Hypoxia.   CONSULTATIONS: None.   LABORATORIES AT DISCHARGE: Sodium 135, potassium 4.4, chloride 100, bicarb 27, BUN 27, creatinine 1.10, glucose 255.   HOSPITAL COURSE: A very pleasant 54 year old male with a history of diabetes and temporal arteritis who presented with acute respiratory failure. For detailed history, please refer to the H and P. 1.  Acute respiratory failure. Likely this is from acute bronchitis with reactive airway disease. I do not see in the patient's medical chart or from his history that he has a history of COPD. He was treated as outlined below.  2.  Acute bronchitis with reactive airway disease. The patient was placed on IV steroids, transitioned to p.o. steroids. He had some very mild rhonchi on discharge, but was not hypoxic and was speaking full sentences and doing well. He had a recent diagnosis of temporal arteritis, is already on p.o. steroids, which we will continue, and he can follow up with his PCP regarding tapering dose of this. He was also on azithromycin for his acute bronchitis.  3.  Recent diagnosis of temporal arteritis. The patient will continue prednisone. 4.  Abnormal TSH, but a normal T4, likely sick euthyroid. 5.  Hypoxia. Improved.  6.  Uncontrolled diabetes. The patient's A1c is 8.3. He needs close follow-up with his primary care physician and adjustment of his medications for his diabetes.   DISCHARGE  MEDICATIONS: 1.  Flovent 2 puffs b.i.d.  2.  Gabapentin 300 mg 2 tablets b.i.d.  3.  Albuterol 2 puffs q. 4 to 6 hours p.r.n.  4.  Ibuprofen 600 mg q. 8 hours p.r.n.  5.  Nitroglycerin sublingual p.r.n. chest pain.  6.  Aspirin 81 mg daily.  7.  Glipizide 10 mg daily.  8.  Imdur 60 mg daily.  9.  Metoprolol 100 mg b.i.d.  10.  Prednisone 60 mg daily.  11. Citalopram 40 mg daily.  12.  Tussionex 5 mL q. 12 hours.  13.  Atorvastatin 20 mg at bedtime.  14.  Pantoprazole 40 mg daily.  15.  Vitamin D3 1,000 international units daily.  16.  Azithromycin 500 mg daily for 5 days.   DISCHARGE DIET: Low sodium, ADA diet.   DISCHARGE ACTIVITY: As tolerated.   DISCHARGE FOLLOWUP: The patient will follow up with Dr. Lovie Macadamia, at [facility], in 1 week.   The patient is stable for discharge.   TIME 35 minutes  ____________________________ Shamila Lerch P. Benjie Karvonen, MD spm:sb D: 12/15/2013 11:43:09 ET T: 12/15/2013 12:08:47 ET JOB#: [id number]  cc: Rida Loudin P. Benjie Karvonen, MD, <Dictator> Donell Beers Keiji Melland MD ELECTRONICALLY SIGNED 12/15/2013 12:36

## 2014-11-03 NOTE — Consult Note (Signed)
Brief Consult Note: Diagnosis: Proximal right fibular fracture.   Patient was seen by consultant.   Recommend further assessment or treatment.   Orders entered.   Comments: 54 year old male had syncopal episode last night and injured the right leg.  Came to Emergency Room where exam and X-rays show a hairline fracture of proximal right fibula according to radiologist.  Very minimal if present at all.  He is very tender there.  Admitted for observation of hemoglobin level and gastrointestinal bleeding, recent myocardial infarction with stent.    Exam:  Alert and comfortable. circulation/sensation/motor function good right leg.  Very tender over upper fibula.  Skin intact.  minimal swelling.    X-rays:  as above  Rx:  continue knee immobilizer and ice        Physical Therapy for protected ambulation '      return to clinic 12-14 days for X-rays.  Electronic Signatures: Park Breed (MD)  (Signed 08-Aug-15 12:20)  Authored: Brief Consult Note   Last Updated: 08-Aug-15 12:20 by Park Breed (MD)

## 2014-11-03 NOTE — Consult Note (Signed)
Brief Consult Note: Diagnosis: drop of hemoglobin, heme positive stool in the setting of recent MI and anticoagulation for cardiac stent.   Patient was seen by consultant.   Consult note dictated.   Recommend further assessment or treatment.   Discussed with Attending MD.   Comments: Please see full GI consult [phone].  Patient presenting with heme positive stool and drop of hemoglobin after a fall with broken bone at home early this am.   No gross rectal bleeding hematemesis, melena to account for a 5 unit loss of blood.  Recommend serial hgb, ct abd and pelvis r/o retroperitoneal bleeding, increase ppi to bid.  Patietn is at high risk for cardiac complications with interruption of  anticoagulation needed for his recent cardiac stent, should there be the need to do sedated luminal evaluation.  Following.  discussed with Dr Earleen Newport.  Electronic Signatures: Loistine Simas (MD)  (Signed 08-Aug-15 14:54)  Authored: Brief Consult Note   Last Updated: 08-Aug-15 14:54 by Loistine Simas (MD)

## 2014-11-03 NOTE — H&P (Signed)
PATIENT NAME:  Larry Terrell, FLY MR#:  [id number] DATE OF BIRTH:  04-24-1961  DATE OF ADMISSION:  10/02/2013  REFERRING PHYSICIAN: Eula Listen, MD  PRIMARY CARE PHYSICIAN: Youlanda Roys. Lovie Macadamia, MD, [facility]. Also, patient has been followed at [facility].   CARDIOLOGY:  [facility]  CHIEF COMPLAINT: Asked by PCP at [facility] to come to the ED for uncontrolled blood pressure.   HISTORY OF PRESENT ILLNESS: This is a 54 year old obese male with known history of coronary artery disease, status post multiple stents at [facility], history of multiple admissions in the past for atypical chest pain, as well as history of hypertension, diabetes, and obesity. The patient was called by his PCP to come to the ED for  abnormal lab at the office today where he had controlled blood sugars. Upon presentation, patient's blood sugar was found to be elevated in the 400 range. As well, patient mentioned having some palpitation, as well as mentioned having chest pain, as well. When asked about his chest pain, reports his chest pain has been chronic going on for a month right now. He  has been admitted in the past for this chest pain with negative work-up as he reports. He reports some palpitations. At one point, patient was sinus tachycardic in the low 100s. The patient had negative troponin x 1. No EKG changes. He was given 324 mg of aspirin in the ED. Was given IV insulin in the ED. Hospitalists were requested to admit the patient for further evaluation and workup. As well, patient mentioned some mild shortness of breath. Reports his chest pain relieved by rest. The patient reports been compliant with his medications, his diet. He is only on glipizide.   PAST MEDICAL HISTORY:  1.  Coronary artery disease, status post stent in the past.  2.  Cirrhosis of the liver.  3.  Hypertension.  4.  Diabetes. 5.  Depression. 6.  Coronary artery disease. 7.  Peripheral vascular disease with femoropopliteal bypass.    ALLERGIES: No known drug allergies.   HOME MEDICATIONS:  2.  Metoprolol 50 mg oral 2 times a day.  3.  Hydrochlorothiazide 12.5 mg daily.  4.  Glipizide 5 mg daily.  5.  Gabapentin 300 mg oral 2 times a day.  6.  Flovent 110 mcg inhalational 1 puff b.i.d.  7.  Doxazosin 4 mg oral daily.  8.  Crestor 5 mg oral daily at bedtime.  9.  Aspirin 81 mg daily.   FAMILY HISTORY: Positive for coronary artery disease in his mother.   SOCIAL HISTORY: The patient used to smoke in the past. He is on disability. Lives at home.   REVIEW OF SYSTEMS:  CONSTITUTIONAL: No fever. No fatigue. No weakness. No weight gain. No weight loss.  EYES: Denies blurry vision, double vision, inflammation, or glaucoma.  ENT: Denies ear pain, hearing loss, epistaxis, or discharge.  RESPIRATORY: Denies cough, wheezing, hemoptysis, COPD.  CARDIOVASCULAR: Reports chest pain, but which appears to be chronic of pleuritic quality. Denies any edema, arrhythmia, syncope. Reports some palpitation.  GASTROINTESTINAL: Denies nausea, vomiting, diarrhea, abdominal pain, hematemesis, melena.  GENITOURINARY: Denies dysuria, hematuria, or renal colic.  ENDOCRINE: Denies polyuria, polydipsia, heat or cold intolerance.  HEMATOLOGY: Denies anemia, easy bruising, dizziness.  DERMATOLOGY: Denies acne, rash, or skin lesion.  MUSCULOSKELETAL: Denies any swelling, gout, arthritis, or cramps.  NEUROLOGIC: Denies CVA, TIA,  ataxia, vertigo, tremor. Denies anxiety or insomnia. Reports history of depression in the past. No substance. No alcohol abuse.  PHYSICAL EXAMINATION:  VITAL SIGNS: Temperature 97.4, pulse 103, respiratory rate 20, blood pressure 151/80, saturating 92% on room air.  GENERAL: An obese male who looks comfortable in no apparent distress.  HEENT: Head atraumatic, normocephalic. Pupils equal, reactive to [place]. Pink conjunctivae. Anicteric sclerae. Moist oral mucosa.  NECK: Supple. No thyromegaly. No JVD.  CHEST: Good air  entry bilaterally. No wheezing, rales, or rhonchi.  CARDIOVASCULAR: S1, S2 heard. No rubs, murmurs, or gallops. No reproducible chest pain on palpation.  ABDOMEN: Soft, nontender, nondistended. Bowel sounds present.  EXTREMITIES: No edema. No clubbing. No cyanosis. Pedal and radial pulses felt bilaterally.  PSYCHIATRIC: Appropriate affect. Awake, alert x 3. Intact judgment and insight.  NEUROLOGIC: Cranial nerves grossly intact. Motor 5 out of 5. No focal deficits.  SKIN: Normal skin turgor. Warm and dry.  LYMPHATIC: No cervical lymphadenopathy could be appreciated. No carotid bruits. MUSCULOSKELETAL: No joint effusion or erythema.   PERTINENT LABORATORY DATA: Glucose 428, BUN 17, creatinine 1.47, sodium 133, potassium 4.1, chloride 98, CO2 26. Troponin less than 0.02. White blood cells 5.9, hemoglobin 15, hematocrit 43.2, platelets 146,000.   EKG showing no change when compared to EKG done in January of 2015 with heart rate of 86.   IMAGING STUDIES: Chest X-Ray: No active cardiopulmonary disease, chronic mild interstitial prominence.   ASSESSMENT AND PLAN:  1.  Chest pain. Seems to be atypical. Patient has this chest pain going on for a few months with negative workup in the past, but given his multiple risk factors he will be admitted to telemetry unit. Will be monitored on telemetry. He was already given 324 mg of aspirin in the Emergency Department. Has no electrocardiogram changes . Already negative troponins x 1. Will cycle his cardiac enzymes. We will continue him on aspirin, metoprolol, p.r.n. nitroglycerin, and statin. 2.  Hypertension, acceptable. Continue with home medication. Hold hydrochlorothiazide until he is appropriately hydrated.  3.  Diabetes mellitus, uncontrolled. Will continue some glipizide. We will have him on insulin sliding scale, as well. We will give him some fluids, as well.  4.  Hyperlipidemia. Continue with statin.  5.  Acute renal failure, mild. Will hydrate.  Patient will repeat level in 24 hours. 6.  Deep vein thrombosis prophylaxis. Subcutaneous heparin. 7.  Gastrointestinal prophylaxis. On Protonix.   CODE STATUS: The patient is full code.   TOTAL TIME SPENT ON ADMISSION AND PATIENT CARE: Fifty minutes.   ____________________________ Albertine Patricia, MD dse:am D: 10/03/2013 00:03:55 ET T: 10/03/2013 00:58:56 ET JOB#: [id number]  cc: Albertine Patricia, MD, <Dictator> Erina Hamme Graciela Husbands MD ELECTRONICALLY SIGNED 10/05/2013 0:28

## 2014-11-03 NOTE — Discharge Summary (Signed)
PATIENT NAME:  Larry Terrell, RABURN MR#:  [id number] DATE OF BIRTH:  04/04/1961  DATE OF ADMISSION:  02/17/2014 DATE OF DISCHARGE:  02/18/2014  FINAL DIAGNOSES: 1.  Drop in hemoglobin with guaiac positive stool.  2.  Recent myocardial infarction with stent.  3.  Orthostatic hypotension.  4.  Fibular fracture.  5.  Diabetes.  6.  Nonalcoholic steatohepatitis.  7.  History of congestive heart failure, no signs on this day.  8.  Temporal arteritis.  9.  Left-sided weakness, possible recent transient ischemic attack with right carotid stenosis.   MEDICATIONS:  On discharge include:  1.  Albuterol CFC 2 puffs every 4 to 6 hours as needed for shortness of breath.  2.  Nitroglycerin 0.4 mg every 5 minutes as needed for chest pain.  3.  Aspirin 81 mg daily.  4.  Celexa 40 mg daily.  5.  Atorvastatin 20 mg at bedtime.  6.  Vitamin D 1000 international units daily.  7.  Gabapentin 300 mg 2 tablets twice a day.  8.  Magnesium oxide 400 mg once a day.  9.  Novolin 70/30 of 20 units subcutaneous injection twice a day.  10.  Effient 10 mg daily.  11.  Prednisone 50 mg daily.  12.  Metoprolol 25 mg extended release 1 tablet daily.  13.  Protonix 40 mg twice a day.  14.  Percocet 5/325 one tablet every 4 hours as needed for moderate pain.  15.  Ferrous sulfate 325 mg twice a day.   Stop taking losartan.   HOME HEALTH: Yes, physical therapy, nurse, nurse aide, help with medications, and strength, and assess for orthostatic hypotension.   DIET: Low sodium diet, carbohydrate-controlled diet, regular consistency.   ACTIVITY: As tolerated.   FOLLOW-UP: With Dr. Gustavo Lah, gastroenterology, if needed; Dr. Earnestine Leys, 1 to 2 weeks; Dr. Boyd Kerbs at [facility].   HOSPITAL COURSE: The patient was admitted 02/17/2014, as an observation; discharged 02/18/2014. Came in with leg pain and was found to have a fibular fracture but the reason for observation was suspected GI bleed with drop in hemoglobin from 15 grams  down to 10 grams with guaiac-positive stool, aspirin and Effient withheld and patient was given IV Protonix; a GI consultation was ordered. The patient was on a liquid diet.   LABORATORY AND RADIOLOGICAL DATA DURING THE HOSPITAL COURSE: Included a hemoglobin of 9.8, initially, then repeat 10.4; creatinine was 1.2; tibia and fibula on the right showed nondisplaced fracture involving the proximal fibular shaft; chest x-ray stable cardiomegaly and chronic lung disease. CT scan of the head showed no acute intracranial abnormality. Ultrasound of the carotids showed narrowing on the right internal carotid, 50% to 69%, on the left less than 50%. CT scan of the abdomen and pelvis to rule out retroperitoneal hematoma showed no evidence of acute abnormality, moderate splenomegaly. Repeat hemoglobin 10.2, another repeat hemoglobin 10.5.   HOSPITAL COURSE PER PROBLEM LIST:  1.  For the patient's drop in hemoglobin and guaiac stool; this drops over months; hemoglobin was stable though in this hospital course. I doubt this is a GI bleed. I think he does have guaiac stool. I did increase the proton pump inhibitor to twice a day, started ferrous sulfate to try to keep up with blood losses since the patient had a recent stent and MI, need to keep the patient on aspirin and Effient at this point. I do recommend a follow-up hemoglobin at follow-up appointment with PMD, and can follow up with Dr. Gustavo Lah if  needed if further drop in hemoglobin. Patient is hemodynamically stable.  2.  Recent MI and stent, restarted aspirin and Effient; I decreased the patient's metoprolol to Toprol XL 25 mg daily. I stopped the losartan.  3.  Orthostatic hypotension. Blood pressure at home was dropping down, and that is probably the reason why he fell and broke his fibula. I stopped the losartan, and decreased his metoprolol to  Toprol XL 25 mg daily.  4.  Fibular fracture. The patient was put in a leg brace, seen in consultation by Dr. Earnestine Leys, recommended a follow-up in 2 weeks. PT recommended home with home health which the patient already had, can resume.  5.  Diabetes. Was put on sliding scale while here, can go back on his usual schedule at home.  6.  NASH.  7.  History of CHF. No signs on this hospital stay. The patient takes Lasix only as needed at home.  8.  Temporal arteritis. He is on 50 mg of prednisone.  9.  Left-sided weakness, possible recent TIA.  He does have some right carotid stenosis, can get a CT angiogram of the carotids for further evaluation as outpatient.   TIME SPENT ON DISCHARGE:  Was 40 minutes.     ____________________________ Tana Conch. Leslye Peer, MD rjw:nt D: 02/18/2014 14:29:53 ET T: 02/18/2014 15:56:08 ET JOB#: [id number]  cc: Tana Conch. Leslye Peer, MD, <Dictator> Bryson Ha, MD Lollie Sails, MD    Marisue Brooklyn MD ELECTRONICALLY SIGNED 02/19/2014 11:51

## 2014-11-03 NOTE — Discharge Summary (Signed)
PATIENT NAME:  Larry Terrell, Larry Terrell MR#:  [id number] DATE OF BIRTH:  05/25/1961  DATE OF ADMISSION:  10/03/2013 DATE OF DISCHARGE:  10/03/2013  ADMITTING PHYSICIAN: Phillips Climes, MD DISCHARGING PHYSICIAN: Gladstone Lighter, MD PRIMARY CARE PHYSICIAN: At [facility].   DISCHARGE DIAGNOSES:  1.  Noncardiac pleuritic chest pain which resolved.  2.  Palpitations. Halter monitor to be placed by [facility] cardiology as an outpatient.  3.  Uncontrolled diabetes mellitus.  4.  Coronary artery disease, status post stents.  5.  Hypertension.  6.  Depression.  7.  Nonalcoholic liver cirrhosis.  8.  Peripheral vascular disease.   DISCHARGE HOME MEDICATIONS:  1.  Flovent 110 mcg inhaler 2 puffs twice a day.  2.  Gabapentin 600 mg p.o. b.i.d.  3.  Zoloft 200 mg p.o. at bedtime.  4.  Albuterol inhaler 2 puffs q.4-6 hours p.r.n. for wheezing.  5.  Ibuprofen 600 mg q.8 hours p.r.n. for pain.  6.  Tramadol 50 mg p.o. b.i.d. as needed for pain.  7.  Aspirin 81 mg p.o. daily.  8.  Glipizide 10 mg p.o. b.i.d.  9.  Metoprolol 50 mg p.o. b.i.d.  10.  Imdur 30 mg p.o. daily.  11.  Sublingual nitroglycerin 0.4 mg q.5 minutes p.r.n. for chest pain.  12.  Crestor 5 mg p.o. daily.   DISCHARGE DIET: Low-sodium and ADA 1800-calorie diet.   DISCHARGE ACTIVITY: As tolerated.    FOLLOWUP INSTRUCTIONS:  1.  Follow up with PCP in one week, give her diabetes management.  2.  Follow up with cardiologist at [facility] as priorly scheduled for Holter monitor.   LABORATORIES AND IMAGING STUDIES PRIOR TO DISCHARGE: Troponins x3 remained negative. HbA1c elevated at 7.5.   WBC 6.6, hemoglobin 14.6, hematocrit 41.1, platelet count 141. Magnesium 1.5.   Chest x-ray showing no acute cardiopulmonary disease, chronic mild interstitial prominence.   Sodium 133, potassium 4.1, chloride 98, bicarb 26, BUN 17, creatinine 1.47, glucose 428 and calcium of 9.1.   BRIEF HOSPITAL COURSE: Larry Terrell is a 54 year old male with past medical  history significant for coronary artery disease status post stents; hypertension; diabetes; nonalcoholic liver cirrhosis who presents to the hospital secondary to chest pain and also elevated blood sugars sent in by PCP.  1.  Noncardiac pleuritic chest pain. The patient denies any chest pain currently. He had actually palpitations for which he was in the process of getting a Holter monitor. He was monitored and was in normal sinus rhythm while here, and historyof CAD but troponins were negative, so he is advised to follow up with his cardiologist as an outpatient.  2.  Uncontrolled diabetes mellitus. Blood sugars in the 400-range as an outpatient. HbA1c  is only at 7.5. His glipizide is increased to 10 mg b.i.d. and he is advised to follow up with PCP next week.  3.  Hypertension. Actually on the lower side, so his metoprolol and Imdur are being continued. Imdur dose is reduced and Diovan hydrochlorothiazide is held at this time at discharge. He is supposed to follow up with PCP regarding blood pressure medication management as well.  4.  Coronary artery disease, status post previous stents, appears stable. Troponins negative. No active chest pain. Continue all cardiac medications.   His course has been otherwise uneventful in the hospital.   DISCHARGE CONDITION: Stable.   DISCHARGE DISPOSITION: Home.   TIME SPENT ON DISCHARGE: 40 minutes.   ____________________________ Gladstone Lighter, MD rk:np D: 10/03/2013 14:24:05 ET T: 10/03/2013 15:55:00 ET JOB#: [id number]  cc: Gladstone Lighter, MD, <Dictator> [facility] Gladstone Lighter MD ELECTRONICALLY SIGNED 10/05/2013 16:07

## 2014-11-04 NOTE — Consult Note (Signed)
Brief Consult Note: Diagnosis: 1. Chest pain in patient with CAD, will need to r/o ischemia verses musculoskeletal etiology.   Consult note dictated.   Comments: TNI neg. x 3 with no acute ST/T wave changes Stress test done this am-awaiting results Continue ASA, beta-blocker, statin, Diovan, nitro prn.  Electronic Signatures: Angelica Ran (MD)   (Signed 31-Jan-13 11:57)  Co-Signer: Brief Consult Note Gabriel Rung A (PA-C)   (Signed 31-Jan-13 09:30)  Authored: Brief Consult Note  Last Updated: 31-Jan-13 11:57 by Angelica Ran (MD)

## 2014-11-04 NOTE — Consult Note (Signed)
PATIENT NAME:  Larry Terrell, HEREFORD MR#:  [id number] DATE OF BIRTH:  [birth year]  DATE OF CONSULTATION:  08/13/2011  REFERRING PHYSICIAN:  Gladstone Lighter, MD  CONSULTING PHYSICIAN:  Merla Riches, PA-C   PRIMARY CARE PHYSICIAN: [facility]   HISTORY OF PRESENT ILLNESS: Larry Terrell is a 54 year old white male who looks older than his stated age who has a past medical history significant for coronary artery disease, hypertension, peripheral vascular disease, hyperlipidemia, diabetes mellitus, fatty liver disease, and splenomegaly. Larry Terrell was at home yesterday when he started to experience substernal and left-sided chest pain. Pain is described as a pulling type sensation that radiated into his left arm and he had some associated shortness of breath but no diaphoresis. He noticed the pain lasted about five minutes. He is normally able to exercise and walk his dog without any problems, however, over the last recent months has had some increased shortness of breath. He denies any wheezing and orthopnea but has some occasional nonproductive cough. He does complain of pain especially in the left leg that seems to be worse when he walks.   Upon further questioning, the patient states that he has been compliant with medications and has been seen at the [facility].   PAST MEDICAL HISTORY:  1. Coronary artery disease status post RCA and LAD stents. He had in-stent restenosis of the RCA stent and bare metal stent was placed in 2011.  2. Hypertension.  3. Peripheral vascular disease requiring bypass.  4. Hyperlipidemia.  5. Diabetes mellitus, non-insulin-dependent.  6. History of acute renal failure after cardiac catheterization in the past, which resolved with therapy.  7. Fatty liver disease with elevated transaminases.  8. Splenomegaly.   PAST SURGICAL HISTORY:  1. Left eye partial corneal replacement.  2. Vascular bypass surgery to the lower extremities (exact details are unknown).    ALLERGIES: No known drug allergies.   HOME MEDICATIONS:  1. Aspirin 325 mg 1 tablet p.o. daily.  2. Diovan/HCTZ 160/12 .5 one tablet p.o. daily.  3. Glipizide 5 mg p.o. daily.  4. Metformin 1000 mg p.o. twice daily.  5. Metoprolol 50 mg p.o. twice daily.  6. Zoloft 100 mg p.o. daily.  7. Pravachol 20 mg p.o. daily.   SOCIAL HISTORY: The patient lives at home with his wife. He is a former smoker and stopped smoking more than 10 years ago. He rarely drinks any alcohol. Denies any street drugs.   FAMILY HISTORY: Mom with coronary artery disease and CVA in her 14's.   REVIEW OF SYSTEMS: The patient has fatigue, decrease in vision in the left eye, shortness of breath, coughing, chest pain as mentioned in history of present illness, pain in the left leg.   PHYSICAL EXAMINATION:   GENERAL: This is an overweight male who is not in any acute distress. He is alert and oriented x3.   VITAL SIGNS: Temperature 97.7 degrees Fahrenheit, heart rate 74, respiratory rate 20, blood pressure 123/89, oxygen saturation 94% on room air.   HEENT: Head atraumatic, normocephalic.   EYES: Pupils round, equal, bilaterally reactive to [place]arkin. There is no scleral icterus. Conjunctivae are pale pink. Ears and nose are normal to external inspection.   MOUTH: Moist mucous membranes. Good dentition.   NECK: Neck is supple. Trachea is midline. Thyroid is smooth and mobile.   LUNGS: Clear to auscultation bilaterally both anteriorly and posteriorly. No adventitious breath sounds are appreciated. No use of accessory muscle use.   CARDIOVASCULAR: The patient has regular  rate and rhythm. No murmurs, rubs, or gallops appreciated. He has some mild chest wall tenderness to palpation.   ABDOMEN: Obese. Bowel sounds are present in all four quadrants. He has some mild left lower quadrant tenderness on exam. Due to obesity, difficult to evaluate for hepatosplenomegaly.   EXTREMITIES: No cyanosis, clubbing, or edema.    ANCILLARY DATA: EKG with normal sinus rhythm, heart rate of 77. Chest x-ray no acute cardiopulmonary disease. Left shoulder x-ray no acute bony abnormality.   Glucose 142, BUN 21, creatinine 1.27, sodium 139, potassium 4.1, chloride 103, estimated GFR is greater than 60. LDL 44, VLDL 72, HDL 21, total cholesterol 137. Calcium 9.0. Magnesium 1.8. Hemoglobin A1c 7.2. Total protein 8.3. Total bilirubin 0.8. Alkaline phosphatase 84. AST 98. ALT 114. Total CK 45. CK-MB is less than 0.5. Troponin-I is less than 0.02. White blood cell count 7.0, hemoglobin 13.9, hematocrit 41.3, platelet count 124,000.   ASSESSMENT/PLAN:  1. Chest pain in a patient with history of coronary artery disease. The patient had some short-lived substernal left-sided chest pain radiating to the left arm with increased shortness of breath. There are no acute ST or T wave changes on EKG and troponin-I has been negative. The patient is currently chest pain free. Will continue the patient on aspirin, metoprolol, Diovan, statin, and nitro as needed. Stress Myoview has been ordered and further recommendations and treatment will depend on results.  2. Hypertension, controlled. Continue current medications.  3. Diabetes mellitus. Continue glipizide, metformin, and sliding scale insulin per the hospitalist.   Larry Terrell's case was discussed with Dr. Neoma Laming who agreed with the evaluation and management of the patient as mentioned above. Thank you very much for this consultation and allowing [place] to participate in this patient's care.   ____________________________ Merla Riches, PA-C for Neoma Laming, MD mam:drc D: 08/13/2011 09:24:07 ET T: 08/13/2011 10:18:03 ET JOB#: [id number]  cc: Merla Riches, PA-C, <Dictator>, [facility] Twylia [facility]ka [facility] PA ELECTRONICALLY SIGNED 08/13/2011 13:19

## 2014-11-04 NOTE — Discharge Summary (Signed)
PATIENT NAME:  Larry Terrell, Larry Terrell MR#:  [id number] DATE OF BIRTH:  [birth year]  DATE OF ADMISSION:  08/12/2011 DATE OF DISCHARGE:  08/13/2011  ADMITTING PHYSICIAN: Gladstone Lighter, MD  DISCHARGING PHYSICIAN: Deanne Coffer, MD  PRIMARY CARE PHYSICIAN: [facility].  ADMITTING DIAGNOSIS: Chest pain.   DISCHARGE DIAGNOSES:  1. Chest pain, most likely pleuritic nature. 2. Hypertension. 3. Diabetes. 4. Fatty liver disease.  5. Body aches. 6. Possible viral illness.   CONSULTANT:  Neoma Laming, MD.  TESTS DURING HOSPITALIZATION: Chest x-ray on 08/12/2011: No acute cardiopulmonary disease.   X-ray of left shoulder, on 08/12/2011, showed no acute bony abnormality of the left shoulder.  Myoview stress test, on 08/13/2011, showed ejection fraction of 73%. No perfusion defect. No evidence of ischemia. Normal ventricular systolic function.   HOSPITAL COURSE: Initial history and physical were done by Dr. Gladstone Lighter. Please refer to her note dated 08/12/2011 for complete details. In brief, this is a 54 year old white male with past medical history significant for coronary artery disease status post LAD and RCA stents in 2000 followed by in-stent restenosis who presented with chest pain. The patient was admitted to the hospitalist service. Chest pain was thought to be pleuritic in nature and not coronary syndrome.  1. For his chest pain, he was followed on telemetry. He had three sets of cardiac enzymes. He was continued on aspirin, metoprolol, Diovan, and nitroglycerin. He was seen by Dr. Neoma Laming who recommended Myoview. He had no troponin leak and he was recommended for discharge home. His chest pain is felt to be pleuritic in nature. 2. Hypertension: He was continued on home medications. His blood pressure is fairly well controlled.  3. Diabetes: A1c was found to be 7.2. He was continued on sliding scale insulin, glipizide, and metformin. 4. Fatty liver disease: The patient is to  followup with [facility] next month. His LFTs have improved since previous one. 5. Body aches, sore throat: This was most likely viral illness. His Influenza test was negative.  6. GI and DVT prophylaxis: He was maintained with Lovenox and aspirin.  CODE STATUS: FULL CODE.   PHYSICAL EXAMINATION:   VITALS: Temperature 97.4, heart rate 76, respiration 20, blood pressure 141/81, and saturating 98% on room air.   LUNGS: Clear to auscultation.   CARDIOVASCULAR: Regular rate and rhythm.   ABDOMEN: Benign.   DISCHARGE MEDICATIONS:  1. Aspirin 325 mg once daily. 2. Diovan/hydrochlorothiazide 160/12.5 mg one tablet daily.  3. Sertraline 100 mg daily.  4. Pravastatin 20 mg daily at bedtime. 5. Glipizide 5 mg daily. 6. Metformin 1000 mg twice a day. 7. Metoprolol 50 mg three times daily. 8. Percocet 5/325 mg one tablet p.o. every six hours p.r.n. pain, #30 tablets. 9. Nitroglycerin 0.4 mg sublingual every five minutes x3 p.r.n. chest pain, #30.   HOME OXYGEN: None.   DIET: Low sodium, ADA, low fat diet.   ACTIVITY: As tolerated.   DISCHARGE FOLLOWUP: The patient is to see the [facility] in 7 to 10 days.   TOTAL TIME SPENT ON DISCHARGE: 40 minutes.  ____________________________ Judeth Horn Royden Purl, MD aaf:slb D: 08/14/2011 11:59:00 ET T: 08/16/2011 12:57:18 ET JOB#: [id number]  cc: Mike Craze A. Royden Purl, MD, <Dictator> [facility] Dionisio David, MD Mike Craze Cassell Clement MD ELECTRONICALLY SIGNED 08/17/2011 11:21

## 2014-11-04 NOTE — H&P (Signed)
PATIENT NAME:  Larry Terrell, PINGREE MR#:  [id number] DATE OF BIRTH:  December 30, 1960  DATE OF ADMISSION:  08/12/2011  ADMITTING PHYSICIAN: Gladstone Lighter, MD   PRIMARY MD: [facility]    CHIEF COMPLAINT: Chest pain.   HISTORY OF PRESENT ILLNESS: Mr. Larry Terrell is a 54 year old male with past medical history significant for coronary artery disease status post prior LAD and RCA stents in the year 2000 followed by an in-stent stenosis of RCA in June 2011 requiring stent at the time, hypertension, diabetes, and peripheral vascular disease who comes to the hospital complaining of chest pain. The patient has had 2 to 3 ER visits in the last six months for chest pain and his last admission in August 2011 was secondary to chest pain probably from costochondritis from discharge summary. He says he has pleuritic chest pain once in a while, but over the last couple of days he has been having a heavy pain in his chest, about 6/10 in intensity associated with diaphoresis, nausea, and difficulty taking deep breaths. Denies any fevers or chills but has a nonproductive cough over the last couple of days too. He states his intermittent chest pain from costochondritis comes and goes and has been persistent over the last couple of days too. Since he was not getting any better, he was worried and came to the ER. His last cardiac catheterization was from June 2011 showing in-stent stenosis of the RCA and non-drug-eluting bare metal stent was placed.   PAST MEDICAL HISTORY:  1. Coronary artery disease status post RCA and LAD stents, most recent was bare metal stent in mid RCA for an in-stent stenosis.  2. Hypertension.  3. Peripheral vascular disease requiring bypass surgery to leg.  4. Hyperlipidemia.  5. Diabetes mellitus, non-insulin-dependent.  6. History of acute renal failure after cardiac catheterization, improved.  7. Fatty liver disease with elevated transaminases. 8. Splenomegaly.  PAST SURGICAL HISTORY:  1. Left  eye partial corneal replacement.  2. Bypass surgery to his lower extremities.  3. Cardiac stents and also lower extremity stent placement.   ALLERGIES TO MEDICATIONS: No known drug allergies.   CURRENT MEDICATIONS AT HOME:  1. Aspirin 325 mg p.o. daily.  2. Diovan/HCTZ 160 mg/12.5 mg 1 tablet p.o. daily.  3. Glipizide 5 mg p.o. daily.  4. Metformin 1000 mg p.o. b.i.d.  5. Metoprolol 50 mg p.o. b.i.d.  6. Zoloft 100 mg p.o. daily.  7. Pravachol 20 mg p.o. daily.    SOCIAL HISTORY: Lives at home with wife. Stopped smoking more than 10 years ago. Drinks occasional alcohol. He states he has had two beers in the last seven months now.   FAMILY HISTORY: Mom with CAD and stroke in 56's. Dad with Alzheimer's dementia.  REVIEW OF SYSTEMS: CONSTITUTIONAL: No fever. Positive for fatigue. 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 No glaucoma. ENT: No tinnitus, ear pain, hearing loss, epistaxis. Positive for sore throat. RESPIRATORY: Positive for cough. No wheeze, hemoptysis, or COPD. CARDIOVASCULAR: Positive for chest pain. No orthopnea, edema, arrhythmia, palpitations, or syncope. GI: No nausea, vomiting, abdominal pain, hematemesis, or melena. GU: No dysuria, hematuria, renal calculus, frequency, or incontinence. ENDOCRINE: No polyuria, nocturia, thyroid problems, heat or cold intolerance. HEMATOLOGY: No anemia, easy bruising or bleeding. SKIN: No acne, rash, or lesions. MUSCULOSKELETAL: Positive for low back pain and also left shoulder pain. NEUROLOGIC: No numbness, weakness, CVA, TIA, or seizures. PSYCHOLOGICAL: The patient is awake, alert, oriented x3.   PHYSICAL EXAMINATION:   VITAL  SIGNS: Temperature afebrile, pulse 79, respirations 18, blood pressure 130/78, pulse oximetry 98% on room air.   GENERAL: Heavily built, well nourished male lying in bed not in any acute distress.   HEENT: Normocephalic, atraumatic. Pupils equal, round, reacting to [place]. Anicteric sclerae.  Extraocular movements intact. Oropharynx clear without erythema, mass, or exudates.   NECK: Supple. No thyromegaly, JVD, or carotid bruits. No lymphadenopathy.   LUNGS: Clear to auscultation bilaterally. No wheeze or crackles. No use of accessory muscles for breathing.   CARDIOVASCULAR: S1, S2 regular rate and rhythm. No murmurs, rubs, or gallops. Mild chest wall tenderness.   ABDOMEN: Obese, soft, nontender, nondistended. Unable to palpate liver or spleen enlargement. Normal bowel sounds.   EXTREMITIES: No pedal edema. No clubbing or cyanosis. 2+ dorsalis pedis pulses palpable bilaterally.   SKIN: No acne, rash, or lesions.   LYMPH: No cervical lymphadenopathy.   NEUROLOGIC: Cranial nerves intact. No focal motor or sensory deficits.   PSYCHOLOGICAL: The patient is awake, alert, oriented x3.   LABORATORY, DIAGNOSTIC, AND RADIOLOGICAL DATA: WBC 8.0, hemoglobin 15.3, hematocrit 43.7, platelet count 165, sodium 136, potassium 4.1, chloride 100, bicarb 26, BUN 20, creatinine 1.27, glucose 289, calcium 9.7, ALT 114, AST 98, albumin 4.0, alkaline phosphatase 84, total bilirubin 0.8. Troponin less than 0.02. CK 54. CK-MB less than 0.5. Urinalysis negative for any infection. EKG showing normal sinus rhythm, heart rate of 77.   ASSESSMENT AND PLAN: This is a 54 year old male with history of coronary artery disease status post LAD and RCA stents, hypertension, fatty liver, and diabetes who comes to the hospital for chest pain. 1. Atypical chest pain, could be unstable angina versus musculoskeletal pain. He has history of costochondritis and pleuritic chest pain though he describes this pain as different from them. Will admit him under observation with his high risk and cardiac history. Get a Myoview in the morning. Recycle cardiac enzymes and get a Cardiology consultation by Dr. Neoma Laming. Continue aspirin, metoprolol, Diovan, statin, and nitro p.r.n.  2. Hypertension. Continue home medications.   3. Diabetes mellitus. Check HbA1c. Continue glipizide, metformin, and sliding scale insulin.  4. Fatty liver disease. His transaminases are much improved when compared to the past. No hepatitis profile here done but he has an appointment at [facility] for his liver disease so will avoid unnecessary testing at this time.  5. Generalized body aches, left shoulder pain, sore throat. Will get flu test, probably from viral illness. Left shoulder x-ray, possibly arthritis.  6. GI and DVT prophylaxis. Protonix and Lovenox.   CODE STATUS: FULL CODE.   TIME SPENT ON ADMISSION: 50 minutes.   ____________________________ Gladstone Lighter, MD rk:drc D: 08/12/2011 19:12:28 ET T: 08/13/2011 06:34:52 ET JOB#: [id number]  cc: Gladstone Lighter, MD, <Dictator> [facility] Gladstone Lighter MD ELECTRONICALLY SIGNED 08/18/2011 13:39

## 2014-11-06 ENCOUNTER — Observation Stay
Admit: 2014-11-06 | Disposition: A | Discharge status: Home / Self Care | Payer: Self-pay | Attending: Internal Medicine | Admitting: Internal Medicine

## 2014-11-06 DIAGNOSIS — I639 Cerebral infarction, unspecified: Secondary | ICD-10-CM | POA: Diagnosis not present

## 2014-11-06 LAB — CBC
HCT: 39.3 % — AB (ref 40.0–52.0)
HGB: 13.6 g/dL (ref 13.0–18.0)
MCH: 31.4 pg (ref 26.0–34.0)
MCHC: 34.6 g/dL (ref 32.0–36.0)
MCV: 91 fL (ref 80–100)
Platelet: 188 10*3/uL (ref 150–440)
RBC: 4.33 10*6/uL — ABNORMAL LOW (ref 4.40–5.90)
RDW: 14.6 % — ABNORMAL HIGH (ref 11.5–14.5)
WBC: 6.8 10*3/uL (ref 3.8–10.6)

## 2014-11-06 LAB — COMPREHENSIVE METABOLIC PANEL
ALK PHOS: 97 U/L
ALT: 39 U/L
AST: 36 U/L
Albumin: 3.9 g/dL
Anion Gap: 8 (ref 7–16)
BILIRUBIN TOTAL: 1.3 mg/dL — AB
BUN: 20 mg/dL
CO2: 25 mmol/L
Calcium, Total: 9.4 mg/dL
Chloride: 104 mmol/L
Creatinine: 1.04 mg/dL
EGFR (African American): >60
EGFR (Non-African Amer.): >60
GLUCOSE: 214 mg/dL — AB
Potassium: 3.5 mmol/L
SODIUM: 137 mmol/L
Total Protein: 7.2 g/dL

## 2014-11-06 LAB — TROPONIN I: Troponin-I: <0.03 ng/mL

## 2014-11-06 LAB — URINALYSIS, COMPLETE
BLOOD: NEGATIVE
Bacteria: NONE SEEN
Bilirubin,UR: NEGATIVE
Glucose,UR: NEGATIVE mg/dL (ref 0–75)
Ketone: NEGATIVE
LEUKOCYTE ESTERASE: NEGATIVE
NITRITE: NEGATIVE
Ph: 6 (ref 4.5–8.0)
Protein: 100
RBC,UR: NONE SEEN /HPF (ref 0–5)
Specific Gravity: 1.008 (ref 1.003–1.030)
Squamous Epithelial: NONE SEEN

## 2014-11-06 LAB — CK TOTAL AND CKMB (NOT AT ARMC)
CK, Total: 21 U/L — ABNORMAL LOW
CK-MB: 0.9 ng/mL

## 2014-11-06 LAB — PROTIME-INR
INR: 1
Prothrombin Time: 13.4 secs

## 2014-11-07 LAB — LIPID PANEL
CHOLESTEROL: 172 mg/dL
HDL Cholesterol: 25 mg/dL — ABNORMAL LOW
LDL CHOLESTEROL, CALC: 87 mg/dL
Triglycerides: 298 mg/dL — ABNORMAL HIGH
VLDL Cholesterol, Calc: 60 mg/dL — ABNORMAL HIGH

## 2014-11-11 NOTE — H&P (Signed)
PATIENT NAME:  Larry Terrell, Larry Terrell MR#:  [id number] DATE OF BIRTH:  1960/11/01  DATE OF ADMISSION:  11/06/2014  PRIMARY CARE PHYSICIAN: [facility]   REQUESTING PHYSICIAN: Dr. Boyd Kerbs. Cardiology, Dr. Saunders Revel at [facility]. Dr. Harvest Dark.   CHIEF COMPLAINT: Slurred speech and weakness.   HISTORY OF PRESENT ILLNESS: The patient is a 54 year old male with a known history of heart failure, coronary artery disease, diabetes. He is being admitted for TIA. The patient started in sudden onset of slurred/garbled speech with weakness around 12:30 this afternoon. He was also having difficulty walking and could not balance himself. He actually fell yesterday. Did not hit his head, but did have some left chest wall contusion. He was able to manage overnight and this morning, but this afternoon, again, he started having ataxia and this garbled speech for which he decided to come to the Emergency Department. He was not able to stand up. While in the ED, his speech is almost quite back to baseline. Especially now that I am seeing him he is saying that he is almost 95% to 99% back to normal. He is being admitted for further evaluation and management.   PAST MEDICAL HISTORY:  1.  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  2.  Congestive heart failure.  3.  Coronary artery disease.  4.  Diabetes.  5.  Temporal arteritis.  6.  NASH.  7.  Enlarged spleen.  8.  Swelling of the lower extremities.  9.  Peripheral vascular disease.   PAST SURGICAL HISTORY: MI with stents in the heart in July. Bypass in the legs. Coronary transplant.   ALLERGIES: No known drug allergies.   SOCIAL HISTORY: Quit smoking 10 years ago. No alcohol. No drug use. He is on disability.   FAMILY HISTORY: Father died of Alzheimer's, also had MI. Mother died of stroke.   MEDICATIONS AT HOME:  1.  Aspirin 81 mg p.o. daily.  2.  Atorvastatin 20 mg p.o. at bedtime.  3.  Citalopram 40 mg p.o. daily.  4.  Colace 100 mg p.o. b.i.d. 5.  Flovent 2  puffs inhaled twice a day.  6.  Flurbiprofen ophthalmic drops to each eye every 30 minutes.  7.  Gabapentin 300 mg 2 capsules p.o. b.i.d.  8.  Magnesium oxide 400 mg p.o. daily.  9.  Metoprolol 50 mg p.o. b.i.d.  10.   Insulin Novolin 70/30 with 22 units every morning and 20 units every evening.  11.   Polymyxin/trimethoprim ophthalmic solution one drop to each affected eye every 3 hours.  12.   Prednisone 5 mg p.o. daily.  13.   Tramadol 50 mg 2 tablets p.o. q. 6 hours as needed.  14.   Tylenol 500 mg 2 tablets p.o. every 6 hours.  15.   Zinc 50 mg once daily.   REVIEW OF SYSTEMS:  CONSTITUTIONAL: No fever, fatigue, weakness.  EYES: No blurred or double vision.  ENT: No tinnitus or ear pain.  RESPIRATORY: No cough, wheeze, hemoptysis.  CARDIOVASCULAR: No chest pain, orthopnea, or edema.  GASTROINTESTINAL: No nausea, vomiting, diarrhea.  GENITOURINARY: No dysuria, hematuria.  ENDOCRINE: No polyuria or nocturia.  HEMATOLOGY: No anemia or easy bruising.  SKIN: No obvious rash, lesion or ulcer.  MUSCULOSKELETAL: No arthritis or muscle cramps.  NEUROLOGICAL: Positive for some slurred speech/garbled speech earlier in the day, which is resolved. He also has some balance issues. No numbness.  PSYCHIATRIC: No anxiety or depression.   PHYSICAL EXAMINATION:  VITAL SIGNS: Temperature 97.7,  heart rate 95 per minute, respirations 22 per minute, blood pressure 142/89. He is saturating 100% room air.  GENERAL: The patient is a 54 year old male lying in the bed comfortably without any acute distress.  EYES: Pupils equal, round, and reactive to [place] and accommodation. No scleral icterus. Extraocular muscles intact.  HENT: Head atraumatic, normocephalic. Oropharynx and nasopharynx clear.  NECK: Supple. No jugular venous distention. No thyromegaly. No tenderness. LUNGS: Clear to auscultation bilaterally. No wheezing, rales, rhonchi or crepitation.  CARDIOVASCULAR: S1, S2 normal. No murmur, rales.   ABDOMEN: Soft, nontender, nondistended. Bowel sounds present. No organomegaly, mass.  EXTREMITIES: No pedal edema., cyanosis or clubbing.  NEUROLOGICAL: Cranial II  through XII intact. Muscle strength 5/5 in extremities. Sensation intact.  PSYCHIATRIC: The patient is alert and oriented x 3.  SKIN: No obvious rash, lesion or ulcer.  MUSCULOSKELETAL: No joint effusion or tenderness.   LABORATORY PANEL: Normal BMP. Normal liver function tests. Normal CBC. Normal first set of troponins. Normal coagulation panel. Negative UA.   CT scan of the head without contrast in the Emergency Department showed no intracranial hemorrhage, prominent vascular calcification, exophthalmos.    EKG showed no acute ST-T changes.   IMPRESSION AND PLAN: 1.  Transient ischemic attack with symptoms of slurred speech, back to his baseline. He is still not comfortable walking. Will get physical and occupational therapy consultation, obtain an MRI of the brain, carotid Dopplers, 2-D echocardiogram and depending on his therapist evaluation, he may need rehabilitation placement, although I doubt it.  2.  Temporal arteritis. Will continue his prednisone at home dose.  3.  Diabetes. Will continue his home dose of insulin and adjust as needed. Add sliding scale insulin.  4.  Hypertension. We will continue home medication.   CODE STATUS: FULL CODE.   TOTAL TIME TAKING CARE OF THIS PATIENT: 45 minutes.    ____________________________ Lucina Mellow. Manuella Ghazi, MD vss:at D: 11/06/2014 15:59:00 ET T: 11/06/2014 16:22:29 ET JOB#: [id number]  cc: Alaja Goldinger S. Manuella Ghazi, MD, <Dictator> Lucina Mellow [facility] MD ELECTRONICALLY SIGNED 11/08/2014 11:18

## 2014-11-11 NOTE — Discharge Summary (Signed)
PATIENT NAME:  Larry Terrell, Larry Terrell MR#:  [id number] DATE OF BIRTH:  August 28, 1960  DATE OF ADMISSION:  11/06/2014 DATE OF DISCHARGE:  11/07/2014  REASON FOR ADMISSION:  For a detailed note, please see the history and physical done on admission by Dr. Manuella Ghazi.     DIAGNOSES AT DISCHARGE: Transient ischemic attack, diabetes, diabetic neuropathy,   hypertension, history of temporal arteritis.   DIET:  Patient is being discharged on a low-sodium, low-fat, carb-controlled diet.   ACTIVITY:  As tolerated.   FOLLOWUP:  With his primary care physician in [place] in the next 1-2 weeks.   DISCHARGE MEDICATIONS:  Aspirin 81 mg daily; Celexa 40 mg daily; atorvastatin 20 mg at bedtime; gabapentin 300 mg 2 tabs b.i.d.; magnesium oxide 400 mg daily; Novolin 70/30, 22 units in the morning, 20 units in the evening; tramadol 50 mg 2 tabs every 6 hours as needed; metoprolol tartrate 50 mg b.i.d.; prednisone 5 mg daily; zinc 50 mg daily; Flovent 2 puffs b.i.d.; Tylenol 500 mg 2 tabs q. 6 hours as needed; Colace 100 mg b.i.d.; polymyxin/trimethoprim ophthalmic solution 1 drop to affected eye every 3 hours; flurbiprofen ophthalmic solution 0.03% 1 drop to each affected eye every 30 minutes as needed.   HOSPITAL COURSE:  This is a 54 year old male with medical problems as mentioned above, presented to the hospital with slurred speech and suspected to have a possible TIA.   1. Slurred speech.  The patient's acute neurological symptoms were thought to be secondary to a TIA/CVA.  The patient was therefore admitted to the hospital under observation.  Had extensive workup including a CT head and MRI brain, a carotid duplex, and an echocardiogram; all of which did not show any acute abnormalities.  His slurred speech has now resolved.  He has no focal neurological symptoms.  He is therefore being discharged on a baby aspirin and statin as stated.  He was not eligible for any rehabilitation services as he ambulated well and did not  have any residual deficits.  2. History of temporal arteritis.  The patient is on prednisone.  He will continue to follow up with his rheumatologist as an outpatient.  3. Hypertension.  The patient remained hemodynamically stable.  He will continue his metoprolol.  4. Diabetes.  The patient was maintained on his insulin 70/30.  He will continue that.  5. Diabetic neuropathy.  The patient was maintained on his Neurontin.  He will continue that.  6. Depression.  The patient was maintained on his Celexa, and he will resume that upon discharge.   CODE STATUS:  The patient is a full code.   TIME SPENT ON DISCHARGE:  Thirty-five minutes.    ____________________________ Belia Heman. Verdell Carmine, MD vjs:kc D: 11/07/2014 16:08:31 ET T: 11/07/2014 22:41:04 ET JOB#: [id number]  cc: Belia Heman. Verdell Carmine, MD, <Dictator> Primary Care Physician in [place] SIGNED 11/09/2014 14:42

## 2015-04-08 ENCOUNTER — Emergency Department
Admission: EM | Admit: 2015-04-08 | Discharge: 2015-04-08 | Disposition: A | Discharge status: Home / Self Care | Payer: Medicare Other | Attending: Emergency Medicine | Admitting: Emergency Medicine

## 2015-04-08 ENCOUNTER — Emergency Department: Payer: Medicare Other

## 2015-04-08 ENCOUNTER — Encounter: Payer: Self-pay | Admitting: Emergency Medicine

## 2015-04-08 DIAGNOSIS — Z87891 Personal history of nicotine dependence: Secondary | ICD-10-CM | POA: Diagnosis not present

## 2015-04-08 DIAGNOSIS — G629 Polyneuropathy, unspecified: Secondary | ICD-10-CM

## 2015-04-08 DIAGNOSIS — M79606 Pain in leg, unspecified: Secondary | ICD-10-CM | POA: Diagnosis present

## 2015-04-08 DIAGNOSIS — G8929 Other chronic pain: Secondary | ICD-10-CM | POA: Insufficient documentation

## 2015-04-08 DIAGNOSIS — E1140 Type 2 diabetes mellitus with diabetic neuropathy, unspecified: Secondary | ICD-10-CM | POA: Diagnosis not present

## 2015-04-08 HISTORY — DX: Acute myocardial infarction, unspecified: I21.9

## 2015-04-08 HISTORY — DX: Chronic obstructive pulmonary disease, unspecified: J44.9

## 2015-04-08 HISTORY — DX: Polyneuropathy, unspecified: G62.9

## 2015-04-08 HISTORY — DX: Heart failure, unspecified: I50.9

## 2015-04-08 LAB — CBC WITH DIFFERENTIAL/PLATELET
BASOS ABS: 0.1 10*3/uL (ref 0–0.1)
Basophils Relative: 1 %
Eosinophils Absolute: 0.5 10*3/uL (ref 0–0.7)
Eosinophils Relative: 6 %
HCT: 44.6 % (ref 40.0–52.0)
HEMOGLOBIN: 15.5 g/dL (ref 13.0–18.0)
Lymphocytes Relative: 17 %
Lymphs Abs: 1.4 10*3/uL (ref 1.0–3.6)
MCH: 32 pg (ref 26.0–34.0)
MCHC: 34.8 g/dL (ref 32.0–36.0)
MCV: 92.1 fL (ref 80.0–100.0)
Monocytes Absolute: 0.6 10*3/uL (ref 0.2–1.0)
Monocytes Relative: 7 %
Neutro Abs: 5.9 10*3/uL (ref 1.4–6.5)
Neutrophils Relative %: 69 %
PLATELETS: 167 10*3/uL (ref 150–440)
RBC: 4.84 MIL/uL (ref 4.40–5.90)
RDW: 14.9 % — ABNORMAL HIGH (ref 11.5–14.5)
WBC: 8.6 10*3/uL (ref 3.8–10.6)

## 2015-04-08 LAB — COMPREHENSIVE METABOLIC PANEL
ALK PHOS: 97 U/L (ref 38–126)
ALT: 36 U/L (ref 17–63)
AST: 36 U/L (ref 15–41)
Albumin: 4.5 g/dL (ref 3.5–5.0)
Anion gap: 9 (ref 5–15)
BUN: 21 mg/dL — ABNORMAL HIGH (ref 6–20)
CALCIUM: 9.8 mg/dL (ref 8.9–10.3)
CO2: 25 mmol/L (ref 22–32)
CREATININE: 1.13 mg/dL (ref 0.61–1.24)
Chloride: 103 mmol/L (ref 101–111)
GFR calc non Af Amer: >60 mL/min (ref >60)
Glucose, Bld: 202 mg/dL — ABNORMAL HIGH (ref 65–99)
Potassium: 3.8 mmol/L (ref 3.5–5.1)
SODIUM: 137 mmol/L (ref 135–145)
Total Bilirubin: 0.9 mg/dL (ref 0.3–1.2)
Total Protein: 7.9 g/dL (ref 6.5–8.1)

## 2015-04-08 MED ORDER — TRAMADOL HCL 50 MG PO TABS
50.0000 mg | ORAL_TABLET | ORAL | Status: AC
  Administered 2015-04-08: 50 mg via ORAL
  Filled 2015-04-08: qty 1

## 2015-04-08 MED ORDER — TRAMADOL HCL 50 MG PO TABS: 50.0000 mg | ORAL_TABLET | Freq: Four times a day (QID) | ORAL | Status: DC | PRN

## 2015-04-08 NOTE — ED Provider Notes (Signed)
[facility] Emergency Department Provider Note  ____________________________________________  Time seen: 12:15 PM  I have reviewed the triage vital signs and the nursing notes.   HISTORY  Chief Complaint Leg Pain    HPI Larry Terrell is a 54 y.o. male who complains of chronic pain and bilateral lower extremities. He has a history of neuropathy due to diabetes and has chronic burning pain in both hands legs and feet. His doctor recently increased his gabapentin dose to 300 mg 3 times a day, but he has inadequate relief with that. He is limited in the medications he can take at home due to diabetes history of GI bleed and liver disease, so he is only been able to take regular doses of Tylenol to help with his pain in addition to the gabapentin. He reports that with the recent weather change has is been raining the last 2 days, his pain in the legs is worse. No swelling. No recent travel trauma hospitalizations or surgeries. No history of DVT or PE.     Past Medical History  Diagnosis Date  . Diabetes mellitus without complication   . Heart attack   . CHF (congestive heart failure)   . Neuropathy   . COPD (chronic obstructive pulmonary disease)      There are no active problems to display for this patient.    Past Surgical History  Procedure Laterality Date  . Cardiac stents    . Cardiac catheterization       Current Outpatient Rx  Name  Route  Sig  Dispense  Refill  . traMADol (ULTRAM) 50 MG tablet   Oral   Take 1 tablet (50 mg total) by mouth every 6 (six) hours as needed.   8 tablet   0    Gabapentin  Allergies Ace inhibitors   No family history on file.  Social History Social History  Substance Use Topics  . Smoking status: Former Research scientist (life sciences)  . Smokeless tobacco: Not on file  . Alcohol Use: No    Review of Systems  Constitutional:   No fever or chills. No weight changes Eyes:   No blurry vision or double vision.  ENT:   No  sore throat. Cardiovascular:   No chest pain. Respiratory:   No dyspnea or cough. Gastrointestinal:   Negative for abdominal pain, vomiting and diarrhea.  No BRBPR or melena. Genitourinary:   Negative for dysuria, urinary retention, bloody urine, or difficulty urinating. Musculoskeletal:   Negative for back pain. No joint swelling or pain. Chronic neuropathic pain in both hands and lower legs Skin:   Negative for rash. Neurological:   Negative for headaches, focal weakness or numbness. Psychiatric:  No anxiety or depression.   Endocrine:  No hot/cold intolerance, changes in energy, or sleep difficulty.  10-point ROS otherwise negative.  ____________________________________________   PHYSICAL EXAM:  VITAL SIGNS: ED Triage Vitals  Enc Vitals Group     BP 04/08/15 0916 153/82 mmHg     Pulse Rate 04/08/15 0916 83     Resp 04/08/15 0916 20     Temp 04/08/15 0916 97.9 F (36.6 C)     Temp Source 04/08/15 0916 Oral     SpO2 04/08/15 0916 100 %     Weight 04/08/15 0916 216 lb (97.977 kg)     Height 04/08/15 0916 5' 7"  (1.702 m)     Head Cir --      Peak Flow --      Pain Score 04/08/15 0917  8     Pain Loc --      Pain Edu? --      Excl. in [place]? --      Constitutional:   Alert and oriented. Well appearing and in no distress. Eyes:   No scleral icterus. No conjunctival pallor. PERRL. EOMI ENT   Head:   Normocephalic and atraumatic.   Nose:   No congestion/rhinnorhea. No septal hematoma   Mouth/Throat:   MMM, no pharyngeal erythema. No peritonsillar mass. No uvula shift.   Neck:   No stridor. No SubQ emphysema. No meningismus. Hematological/Lymphatic/Immunilogical:   No cervical lymphadenopathy. Cardiovascular:   RRR. Normal and symmetric distal pulses are present in all extremities. No murmurs, rubs, or gallops. Respiratory:   Normal respiratory effort without tachypnea nor retractions. Breath sounds are clear and equal bilaterally. No  wheezes/rales/rhonchi. Gastrointestinal:   Soft and nontender. No distention. There is no CVA tenderness.  No rebound, rigidity, or guarding. Genitourinary:   deferred Musculoskeletal:   Mild diffuse tenderness in both hands feet and calves. There is no swelling or edema, no erythema or warmth or other discoloration. normal range of motion in all extremities. No joint effusions.    No edema. Neurologic:   Normal speech and language.  CN 2-10 normal. Motor grossly intact. No pronator drift.  Normal gait. No gross focal neurologic deficits are appreciated.  Skin:    Skin is warm, dry and intact. No rash noted.  No petechiae, purpura, or bullae. Psychiatric:   Mood and affect are normal. Speech and behavior are normal. Patient exhibits appropriate insight and judgment.  ____________________________________________    LABS (pertinent positives/negatives) (all labs ordered are listed, but only abnormal results are displayed) Labs Reviewed  CBC WITH DIFFERENTIAL/PLATELET - Abnormal; Notable for the following:    RDW 14.9 (*)    All other components within normal limits  COMPREHENSIVE METABOLIC PANEL - Abnormal; Notable for the following:    Glucose, Bld 202 (*)    BUN 21 (*)    All other components within normal limits   ____________________________________________   EKG    ____________________________________________    RADIOLOGY  Chest x-ray unremarkable  ____________________________________________   PROCEDURES   ____________________________________________   INITIAL IMPRESSION / ASSESSMENT AND PLAN / ED COURSE  Pertinent labs & imaging results that were available during my care of the patient were reviewed by me and considered in my medical decision making (see chart for details).  Patient presents with acute exacerbation of chronic neuropathic pain. No other changes. Low suspicion for soft tissue infections such as cellulitis abscess or necrotizing fasciitis. No  evidence of osteomyelitis fracture or dislocation. Very low suspicion for DVT or vascular occlusion. We'll check labs and chest x-ray, give tramadol for the acute pain, and also recommend a trial of turmeric and yellow mustard. Additionally, the patient reports that he can follow up with his doctors at [place] with endocrinology through a urgent appointment.  ----------------------------------------- 1:52 PM on 04/08/2015 -----------------------------------------  Labs unremarkable. Chest x-ray unremarkable. Hemodynamically stable, symptoms controlled, will follow up with primary care or endocrinology tomorrow.   ____________________________________________   FINAL CLINICAL IMPRESSION(S) / ED DIAGNOSES  Final diagnoses:  Neuropathy      Carrie Mew, MD 04/08/15 1352

## 2015-04-08 NOTE — ED Notes (Signed)
Pt reports pain in his legs at night. Reports can't sleep due to the constant feeling. Pt reports seen here for the same and was told it may be restless leg but has not followed up. Pt reports was given a medication last time that helped.

## 2015-04-08 NOTE — Discharge Instructions (Signed)
Diabetic Neuropathy Diabetic neuropathy is a nerve disease or nerve damage that is caused by diabetes mellitus. About half of all people with diabetes mellitus have some form of nerve damage. Nerve damage is more common in those who have had diabetes mellitus for many years and who generally have not had good control of their blood sugar (glucose) level. Diabetic neuropathy is a common complication of diabetes mellitus. There are three more common types of diabetic neuropathy and a fourth type that is less common and less understood:   Peripheral neuropathy--This is the most common type of diabetic neuropathy. It causes damage to the nerves of the feet and legs first and then eventually the hands and arms.The damage affects the ability to sense touch.  Autonomic neuropathy--This type causes damage to the autonomic nervous system, which controls the following functions:  Heartbeat.  Body temperature.  Blood pressure.  Urination.  Digestion.  Sweating.  Sexual function.  Focal neuropathy--Focal neuropathy can be painful and unpredictable and occurs most often in older adults with diabetes mellitus. It involves a specific nerve or one area and often comes on suddenly. It usually does not cause long-term problems.  Radiculoplexus neuropathy-- Sometimes called lumbosacral radiculoplexus neuropathy, radiculoplexus neuropathy affects the nerves of the thighs, hips, buttocks, or legs. It is more common in people with type 2 diabetes mellitus and in older men. It is characterized by debilitating pain, weakness, and atrophy, usually in the thigh muscles. CAUSES  The cause of peripheral, autonomic, and focal neuropathies is diabetes mellitus that is uncontrolled and high glucose levels. The cause of radiculoplexus neuropathy is unknown. However, it is thought to be caused by inflammation related to uncontrolled glucose levels. SIGNS AND SYMPTOMS  Peripheral Neuropathy Peripheral neuropathy develops  slowly over time. When the nerves of the feet and legs no longer work there may be:   Burning, stabbing, or aching pain in the legs or feet.  Inability to feel pressure or pain in your feet. This can lead to:  Thick calluses over pressure areas.  Pressure sores.  Ulcers.  Foot deformities.  Reduced ability to feel temperature changes.  Muscle weakness. Autonomic Neuropathy The symptoms of autonomic neuropathy vary depending on which nerves are affected. Symptoms may include:  Problems with digestion, such as:  Feeling sick to your stomach (nausea).  Vomiting.  Bloating.  Constipation.  Diarrhea.  Abdominal pain.  Difficulty with urination. This occurs if you lose your ability to sense when your bladder is full. Problems include:  Urine leakage (incontinence).  Inability to empty your bladder completely (retention).  Rapid or irregular heartbeat (palpitations).  Blood pressure drops when you stand up (orthostatic hypotension). When you stand up you may feel:  Dizzy.  Weak.  Faint.  In men, inability to attain and maintain an erection.  In women, vaginal dryness and problems with decreased sexual desire and arousal.  Problems with body temperature regulation.  Increased or decreased sweating. Focal Neuropathy  Abnormal eye movements or abnormal alignment of both eyes.  Weakness in the wrist.  Foot drop. This results in an inability to lift the foot properly and abnormal walking or foot movement.  Paralysis on one side of your face (Bell palsy).  Chest or abdominal pain. Radiculoplexus Neuropathy  Sudden, severe pain in your hip, thigh, or buttocks.  Weakness and wasting of thigh muscles.  Difficulty rising from a seated position.  Abdominal swelling.  Unexplained weight loss (usually more than 10 lb [4.5 kg]). DIAGNOSIS  Peripheral Neuropathy Your senses may  be tested. Sensory function testing can be done with:  A Keep touch using a  monofilament.  A vibration with tuning fork.  A sharp sensation with a pin prick. Other tests that can help diagnose neuropathy are:  Nerve conduction velocity. This test checks the transmission of an electrical current through a nerve.  Electromyography. This shows how muscles respond to electrical signals transmitted by nearby nerves.  Quantitative sensory testing. This is used to assess how your nerves respond to vibrations and changes in temperature. Autonomic Neuropathy Diagnosis is often based on reported symptoms. Tell your health care provider if you experience:   Dizziness.   Constipation.   Diarrhea.   Inappropriate urination or inability to urinate.   Inability to get or maintain an erection.  Tests that may be done include:   Electrocardiography or Holter monitor. These are tests that can help show problems with the heart rate or heart rhythm.   An X-ray exam may be done. Focal Neuropathy Diagnosis is made based on your symptoms and what your health care provider finds during your exam. Other tests may be done. They may include:  Nerve conduction velocities. This checks the transmission of electrical current through a nerve.  Electromyography. This shows how muscles respond to electrical signals transmitted by nearby nerves.  Quantitative sensory testing. This test is used to assess how your nerves respond to vibration and changes in temperature. Radiculoplexus Neuropathy  Often the first thing is to eliminate any other issue or problems that might be the cause, as there is no stick test for diagnosis.  X-ray exam of your spine and lumbar region.  Spinal tap to rule out cancer.  MRI to rule out other lesions. TREATMENT  Once nerve damage occurs, it cannot be reversed. The goal of treatment is to keep the disease or nerve damage from getting worse and affecting more nerve fibers. Controlling your blood glucose level is the key. Most people with  radiculoplexus neuropathy see at least a partial improvement over time. You will need to keep your blood glucose and HbA1c levels in the target range determined by your health care provider. Things that help control blood glucose levels include:   Blood glucose monitoring.   Meal planning.   Physical activity.   Diabetes medicine.  Over time, maintaining lower blood glucose levels helps lessen symptoms. Sometimes, prescription pain medicine is needed. HOME CARE INSTRUCTIONS:  Do not smoke.  Keep your blood glucose level in the range that you and your health care provider have determined acceptable for you.  Keep your blood pressure level in the range that you and your health care provider have determined acceptable for you.  Eat a well-balanced diet.  Be active every day.  Check your feet every day. SEEK MEDICAL CARE IF:   You have burning, stabbing, or aching pain in the legs or feet.  You are unable to feel pressure or pain in your feet.  You develop problems with digestion such as:  Nausea.  Vomiting.  Bloating.  Constipation.  Diarrhea.  Abdominal pain.  You have difficulty with urination, such as:  Incontinence.  Retention.  You have palpitations.  You develop orthostatic hypotension. When you stand up you may feel:  Dizzy.  Weak.  Faint.  You cannot attain and maintain an erection (in men).  You have vaginal dryness and problems with decreased sexual desire and arousal (in women).  You have severe pain in your thighs, legs, or buttocks.  You have unexplained weight loss.  Document Released: 09/07/2001 Document Revised: 04/19/2013 Document Reviewed: 12/08/2012 [facility] Patient Information 2015 [place], [place]. This information is not intended to replace advice given to you by your health care provider. Make sure you discuss any questions you have with your health care provider.

## 2015-05-14 ENCOUNTER — Emergency Department
Admission: EM | Admit: 2015-05-14 | Discharge: 2015-05-14 | Disposition: A | Discharge status: Home / Self Care | Payer: Medicare Other | Attending: Emergency Medicine | Admitting: Emergency Medicine

## 2015-05-14 DIAGNOSIS — Z87891 Personal history of nicotine dependence: Secondary | ICD-10-CM | POA: Insufficient documentation

## 2015-05-14 DIAGNOSIS — E1142 Type 2 diabetes mellitus with diabetic polyneuropathy: Secondary | ICD-10-CM | POA: Diagnosis not present

## 2015-05-14 DIAGNOSIS — M79642 Pain in left hand: Secondary | ICD-10-CM | POA: Insufficient documentation

## 2015-05-14 DIAGNOSIS — M79641 Pain in right hand: Secondary | ICD-10-CM | POA: Diagnosis not present

## 2015-05-14 DIAGNOSIS — M79643 Pain in unspecified hand: Secondary | ICD-10-CM

## 2015-05-14 MED ORDER — TRAMADOL HCL 50 MG PO TABS: 50.0000 mg | ORAL_TABLET | Freq: Four times a day (QID) | ORAL | Status: DC | PRN

## 2015-05-14 MED ORDER — TRAMADOL HCL 50 MG PO TABS
50.0000 mg | ORAL_TABLET | Freq: Once | ORAL | Status: AC
  Administered 2015-05-14: 50 mg via ORAL
  Filled 2015-05-14: qty 1

## 2015-05-14 MED ORDER — IBUPROFEN 600 MG PO TABS
600.0000 mg | ORAL_TABLET | Freq: Once | ORAL | Status: AC
  Administered 2015-05-14: 600 mg via ORAL
  Filled 2015-05-14: qty 1

## 2015-05-14 NOTE — ED Provider Notes (Signed)
[name] Emergency Department Provider Note  ____________________________________________  Time seen: Approximately 1:05 AM  I have reviewed the triage vital signs and the nursing notes.   HISTORY  Chief Complaint Hand Pain    HPI Larry Terrell is a 54 y.o. male with a history of diabetic neuropathy who comes into the hospital with pain in his bilateral hands consistent with his neuropathy. The patient reports that he has been taking gabapentin but it has not been helping. The patient's physician had given him some tramadol which had been helping but he ran out of pills a few days ago. The patient reports that he is unable to see his doctor until thursday because he does not have the money to do so. The patient rates his pain as a 9/10 in intensity. He was rolling around in bed and unable to get comfortable so he decided to come in for treatment. The patient has neuropathy in his hands and in his feet. The pain is shooting down his bilateral hands on the left hands.    Past Medical History  Diagnosis Date  . Diabetes mellitus without complication   . Heart attack   . CHF (congestive heart failure)   . Neuropathy   . COPD (chronic obstructive pulmonary disease)     There are no active problems to display for this patient.   Past Surgical History  Procedure Laterality Date  . Cardiac stents    . Cardiac catheterization      Current Outpatient Rx  Name  Route  Sig  Dispense  Refill  . traMADol (ULTRAM) 50 MG tablet   Oral   Take 1 tablet (50 mg total) by mouth every 6 (six) hours as needed.   8 tablet   0   . traMADol (ULTRAM) 50 MG tablet   Oral   Take 1 tablet (50 mg total) by mouth every 6 (six) hours as needed.   12 tablet   0     Allergies Ace inhibitors  No family history on file.  Social History Social History  Substance Use Topics  . Smoking status: Former Research scientist (life sciences)  . Smokeless tobacco: Not on file  . Alcohol Use: No     Review of Systems Constitutional: No fever/chills Eyes: No visual changes. ENT: No sore throat. Cardiovascular: Denies chest pain. Respiratory: Denies shortness of breath. Gastrointestinal: No abdominal pain.  No nausea, no vomiting.  No diarrhea.  No constipation. Genitourinary: Negative for dysuria. Musculoskeletal: bilateral hand pain  Skin: Negative for rash. Neurological: Negative for headaches, focal weakness or numbness.  10-point ROS otherwise negative.  ____________________________________________   PHYSICAL EXAM:  VITAL SIGNS: ED Triage Vitals  Enc Vitals Group     BP 05/14/15 0018 185/76 mmHg     Pulse Rate 05/14/15 0018 79     Resp 05/14/15 0018 18     Temp 05/14/15 0018 97.6 F (36.4 C)     Temp Source 05/14/15 0018 Oral     SpO2 05/14/15 0018 97 %     Weight 05/14/15 0018 216 lb (97.977 kg)     Height 05/14/15 0018 5' 7"  (1.702 m)     Head Cir --      Peak Flow --      Pain Score 05/14/15 0018 9     Pain Loc --      Pain Edu? --      Excl. in [place]? --     Constitutional: Alert and oriented. Well appearing and in  mild distress. Eyes: Conjunctivae are normal. PERRL. EOMI. Head: Atraumatic. Nose: No congestion/rhinnorhea. Mouth/Throat: Mucous membranes are moist.  Oropharynx non-erythematous. Cardiovascular: Normal rate, regular rhythm. Grossly normal heart sounds.  Good peripheral circulation. Respiratory: Normal respiratory effort.  No retractions. Lungs CTAB. Gastrointestinal: Soft and nontender. No distention. Positive bowel sounds Musculoskeletal: No lower extremity tenderness nor edema.   Neurologic:  Normal speech and language. No gross focal neurologic deficits are appreciated.  Skin:  Skin is warm, dry and intact.  Psychiatric: Mood and affect are normal. Speech and behavior are normal.  ____________________________________________   LABS (all labs ordered are listed, but only abnormal results are displayed)  Labs Reviewed - No data to  display ____________________________________________  EKG  none ____________________________________________  RADIOLOGY  none ____________________________________________   PROCEDURES  Procedure(s) performed: None  Critical Care performed: No  ____________________________________________   INITIAL IMPRESSION / ASSESSMENT AND PLAN / ED COURSE  Pertinent labs & imaging results that were available during my care of the patient were reviewed by me and considered in my medical decision making (see chart for details).  This is a 54 year old male who comes in today with some bilateral hand pain. The patient reports that she's had diabetic neuropathy for a long time and this feels very similar to his diabetic neuropathy. I did give the patient a dose of ibuprofen as well as tramadol. The patient has chronic pain and reports that this is very similar to his chronic pain. The patient was watching television in no acute distress I will discharge him to home and have him follow-up with his primary care physician. The patient has no further complaints at this time will be discharged home. ____________________________________________   FINAL CLINICAL IMPRESSION(S) / ED DIAGNOSES  Final diagnoses:  Diabetic polyneuropathy associated with type 2 diabetes mellitus ([place])  Pain of hand, unspecified laterality      Loney Hering, MD 05/14/15 [phone]

## 2015-05-14 NOTE — ED Notes (Signed)
Reviewed d/c instructions and prescriptions with pt.  Pt verbalized understanding.

## 2015-05-14 NOTE — Discharge Instructions (Signed)
Diabetic Neuropathy Diabetic neuropathy is a nerve disease or nerve damage that is caused by diabetes mellitus. About half of all people with diabetes mellitus have some form of nerve damage. Nerve damage is more common in those who have had diabetes mellitus for many years and who generally have not had good control of their blood sugar (glucose) level. Diabetic neuropathy is a common complication of diabetes mellitus. There are three common types of diabetic neuropathy and a fourth type that is less common and less understood:   Peripheral neuropathy--This is the most common type of diabetic neuropathy. It causes damage to the nerves of the feet and legs first and then eventually the hands and arms.The damage affects the ability to sense touch.  Autonomic neuropathy--This type causes damage to the autonomic nervous system, which controls the following functions:  Heartbeat.  Body temperature.  Blood pressure.  Urination.  Digestion.  Sweating.  Sexual function.  Focal neuropathy--Focal neuropathy can be painful and unpredictable and occurs most often in older adults with diabetes mellitus. It involves a specific nerve or one area and often comes on suddenly. It usually does not cause long-term problems.  Radiculoplexus neuropathy-- Sometimes called lumbosacral radiculoplexus neuropathy, radiculoplexus neuropathy affects the nerves of the thighs, hips, buttocks, or legs. It is more common in people with type 2 diabetes mellitus and in older men. It is characterized by debilitating pain, weakness, and atrophy, usually in the thigh muscles. CAUSES  The cause of peripheral, autonomic, and focal neuropathies is diabetes mellitus that is uncontrolled and high glucose levels. The cause of radiculoplexus neuropathy is unknown. However, it is thought to be caused by inflammation related to uncontrolled glucose levels. SIGNS AND SYMPTOMS  Peripheral Neuropathy Peripheral neuropathy develops  slowly over time. When the nerves of the feet and legs no longer work there may be:   Burning, stabbing, or aching pain in the legs or feet.  Inability to feel pressure or pain in your feet. This can lead to:  Thick calluses over pressure areas.  Pressure sores.  Ulcers.  Foot deformities.  Reduced ability to feel temperature changes.  Muscle weakness. Autonomic Neuropathy The symptoms of autonomic neuropathy vary depending on which nerves are affected. Symptoms may include:  Problems with digestion, such as:  Feeling sick to your stomach (nausea).  Vomiting.  Bloating.  Constipation.  Diarrhea.  Abdominal pain.  Difficulty with urination. This occurs if you lose your ability to sense when your bladder is full. Problems include:  Urine leakage (incontinence).  Inability to empty your bladder completely (retention).  Rapid or irregular heartbeat (palpitations).  Blood pressure drops when you stand up (orthostatic hypotension). When you stand up you may feel:  Dizzy.  Weak.  Faint.  In men, inability to attain and maintain an erection.  In women, vaginal dryness and problems with decreased sexual desire and arousal.  Problems with body temperature regulation.  Increased or decreased sweating. Focal Neuropathy  Abnormal eye movements or abnormal alignment of both eyes.  Weakness in the wrist.  Foot drop. This results in an inability to lift the foot properly and abnormal walking or foot movement.  Paralysis on one side of your face (Bell palsy).  Chest or abdominal pain. Radiculoplexus Neuropathy  Sudden, severe pain in your hip, thigh, or buttocks.  Weakness and wasting of thigh muscles.  Difficulty rising from a seated position.  Abdominal swelling.  Unexplained weight loss (usually more than 10 lb [4.5 kg]). DIAGNOSIS  Peripheral Neuropathy Your senses may be  tested. Sensory function testing can be done with:  A Barney touch using a  monofilament.  A vibration with tuning fork.  A sharp sensation with a pin prick. Other tests that can help diagnose neuropathy are:  Nerve conduction velocity. This test checks the transmission of an electrical current through a nerve.  Electromyography. This shows how muscles respond to electrical signals transmitted by nearby nerves.  Quantitative sensory testing. This is used to assess how your nerves respond to vibrations and changes in temperature. Autonomic Neuropathy Diagnosis is often based on reported symptoms. Tell your health care provider if you experience:   Dizziness.   Constipation.   Diarrhea.   Inappropriate urination or inability to urinate.   Inability to get or maintain an erection.  Tests that may be done include:   Electrocardiography or Holter monitor. These are tests that can help show problems with the heart rate or heart rhythm.   An X-ray exam may be done. Focal Neuropathy Diagnosis is made based on your symptoms and what your health care provider finds during your exam. Other tests may be done. They may include:  Nerve conduction velocities. This checks the transmission of electrical current through a nerve.  Electromyography. This shows how muscles respond to electrical signals transmitted by nearby nerves.  Quantitative sensory testing. This test is used to assess how your nerves respond to vibration and changes in temperature. Radiculoplexus Neuropathy  Often the first thing is to eliminate any other issue or problems that might be the cause, as there is no stick test for diagnosis.  X-ray exam of your spine and lumbar region.  Spinal tap to rule out cancer.  MRI to rule out other lesions. TREATMENT  Once nerve damage occurs, it cannot be reversed. The goal of treatment is to keep the disease or nerve damage from getting worse and affecting more nerve fibers. Controlling your blood glucose level is the key. Most people with  radiculoplexus neuropathy see at least a partial improvement over time. You will need to keep your blood glucose and HbA1c levels in the target range determined by your health care provider. Things that help control blood glucose levels include:   Blood glucose monitoring.   Meal planning.   Physical activity.   Diabetes medicine.  Over time, maintaining lower blood glucose levels helps lessen symptoms. Sometimes, prescription pain medicine is needed. HOME CARE INSTRUCTIONS:  Do not smoke.  Keep your blood glucose level in the range that you and your health care provider have determined acceptable for you.  Keep your blood pressure level in the range that you and your health care provider have determined acceptable for you.  Eat a well-balanced diet.  Be physically active every day. Include strength training and balance exercises.  Protect your feet.  Check your feet every day for sores, cuts, blisters, or signs of infection.  Wear padded socks and supportive shoes. Use orthotic inserts, if necessary.  Regularly check the insides of your shoes for worn spots. Make sure there are no rocks or other items inside your shoes before you put them on. SEEK MEDICAL CARE IF:   You have burning, stabbing, or aching pain in the legs or feet.  You are unable to feel pressure or pain in your feet.  You develop problems with digestion such as:  Nausea.  Vomiting.  Bloating.  Constipation.  Diarrhea.  Abdominal pain.  You have difficulty with urination, such as:  Incontinence.  Retention.  You have palpitations.  You  develop orthostatic hypotension. When you stand up you may feel:  Dizzy.  Weak.  Faint.  You cannot attain and maintain an erection (in men).  You have vaginal dryness and problems with decreased sexual desire and arousal (in women).  You have severe pain in your thighs, legs, or buttocks.  You have unexplained weight loss.   This information  is not intended to replace advice given to you by your health care provider. Make sure you discuss any questions you have with your health care provider.   Document Released: 09/07/2001 Document Revised: 07/20/2014 Document Reviewed: 12/08/2012 Elsevier Interactive Patient Education 2016 [employer]  Peripheral Neuropathy Peripheral neuropathy is a type of nerve damage. It affects nerves that carry signals between the spinal cord and other parts of the body. These are called peripheral nerves. With peripheral neuropathy, one nerve or a group of nerves may be damaged.  CAUSES  Many things can damage peripheral nerves. For some people with peripheral neuropathy, the cause is unknown. Some causes include:  Diabetes. This is the most common cause of peripheral neuropathy.  Injury to a nerve.  Pressure or stress on a nerve that lasts a long time.  Too little vitamin B. Alcoholism can lead to this.  Infections.  Autoimmune diseases, such as multiple sclerosis and systemic lupus erythematosus.  Inherited nerve diseases.  Some medicines, such as cancer drugs.  Toxic substances, such as lead and mercury.  Too little blood flowing to the legs.  Kidney disease.  Thyroid disease. SIGNS AND SYMPTOMS  Different people have different symptoms. The symptoms you have will depend on which of your nerves is damaged. Common symptoms include:  Loss of feeling (numbness) in the feet and hands.  Tingling in the feet and hands.  Pain that burns.  Very sensitive skin.  Weakness.  Not being able to move a part of the body (paralysis).  Muscle twitching.  Clumsiness or poor coordination.  Loss of balance.  Not being able to control your bladder.  Feeling dizzy.  Sexual problems. DIAGNOSIS  Peripheral neuropathy is a symptom, not a disease. Finding the cause of peripheral neuropathy can be hard. To figure that out, your health care provider will take a medical history and do a  physical exam. A neurological exam will also be done. This involves checking things affected by your brain, spinal cord, and nerves (nervous system). For example, your health care provider will check your reflexes, how you move, and what you can feel.  Other types of tests may also be ordered, such as:  Blood tests.  A test of the fluid in your spinal cord.  Imaging tests, such as CT scans or an MRI.  Electromyography (EMG). This test checks the nerves that control muscles.  Nerve conduction velocity tests. These tests check how fast messages pass through your nerves.  Nerve biopsy. A small piece of nerve is removed. It is then checked under a microscope. TREATMENT   Medicine is often used to treat peripheral neuropathy. Medicines may include:  Pain-relieving medicines. Prescription or over-the-counter medicine may be suggested.  Antiseizure medicine. This may be used for pain.  Antidepressants. These also may help ease pain from neuropathy.  Lidocaine. This is a numbing medicine. You might wear a patch or be given a shot.  Mexiletine. This medicine is typically used to help control irregular heart rhythms.  Surgery. Surgery may be needed to relieve pressure on a nerve or to destroy a nerve that is causing pain.  Physical therapy  to help movement.  Assistive devices to help movement. HOME CARE INSTRUCTIONS   Only take over-the-counter or prescription medicines as directed by your health care provider. Follow the instructions carefully for any given medicines. Do not take any other medicines without first getting approval from your health care provider.  If you have diabetes, work closely with your health care provider to keep your blood sugar under control.  If you have numbness in your feet:  Check every day for signs of injury or infection. Watch for redness, warmth, and swelling.  Wear padded socks and comfortable shoes. These help protect your feet.  Do not do things  that put pressure on your damaged nerve.  Do not smoke. Smoking keeps blood from getting to damaged nerves.  Avoid or limit alcohol. Too much alcohol can cause a lack of B vitamins. These vitamins are needed for healthy nerves.  Develop a good support system. Coping with peripheral neuropathy can be stressful. Talk to a mental health specialist or join a support group if you are struggling.  Follow up with your health care provider as directed. SEEK MEDICAL CARE IF:   You have new signs or symptoms of peripheral neuropathy.  You are struggling emotionally from dealing with peripheral neuropathy.  You have a fever. SEEK IMMEDIATE MEDICAL CARE IF:   You have an injury or infection that is not healing.  You feel very dizzy or begin vomiting.  You have chest pain.  You have trouble breathing.   This information is not intended to replace advice given to you by your health care provider. Make sure you discuss any questions you have with your health care provider.   Document Released: 06/19/2002 Document Revised: 03/11/2011 Document Reviewed: 03/06/2013 Elsevier Interactive Patient Education [employer].

## 2015-05-14 NOTE — ED Notes (Addendum)
Pt reports chronic pain along dorsal aspect of both hands and feet.  Pt reports this pain to be the same as what he normally experiences.  Pt rates pain at 9 out of 10.  Pt denies any injuries to hands or feet.   Pt report history of CHF, and MI X 2.  Pt reports he had the two MIs on two consecutive days approximately 3 years ago; he was treated at [facility].  Pt reports he takes lasix 79m daily.

## 2015-05-14 NOTE — ED Notes (Signed)
Patient ambulatory to triage with steady gait, without difficulty or distress noted; pt reports pain to hands and feet since yesterday; st hx of same with neuropathy but unable to get appointment with his PCP since Thursday; took 3 ibuprofen at 6pm without relief

## 2015-07-09 ENCOUNTER — Encounter (HOSPITAL_COMMUNITY): Payer: Self-pay | Admitting: Emergency Medicine

## 2015-07-09 DIAGNOSIS — E872 Acidosis: Secondary | ICD-10-CM | POA: Diagnosis present

## 2015-07-09 DIAGNOSIS — K660 Peritoneal adhesions (postprocedural) (postinfection): Secondary | ICD-10-CM | POA: Diagnosis present

## 2015-07-09 DIAGNOSIS — Z888 Allergy status to other drugs, medicaments and biological substances status: Secondary | ICD-10-CM

## 2015-07-09 DIAGNOSIS — Z8673 Personal history of transient ischemic attack (TIA), and cerebral infarction without residual deficits: Secondary | ICD-10-CM

## 2015-07-09 DIAGNOSIS — I5032 Chronic diastolic (congestive) heart failure: Secondary | ICD-10-CM | POA: Diagnosis present

## 2015-07-09 DIAGNOSIS — K7581 Nonalcoholic steatohepatitis (NASH): Secondary | ICD-10-CM | POA: Diagnosis present

## 2015-07-09 DIAGNOSIS — I110 Hypertensive heart disease with heart failure: Secondary | ICD-10-CM | POA: Diagnosis present

## 2015-07-09 DIAGNOSIS — E669 Obesity, unspecified: Secondary | ICD-10-CM | POA: Diagnosis present

## 2015-07-09 DIAGNOSIS — M316 Other giant cell arteritis: Secondary | ICD-10-CM | POA: Diagnosis present

## 2015-07-09 DIAGNOSIS — J449 Chronic obstructive pulmonary disease, unspecified: Secondary | ICD-10-CM | POA: Diagnosis present

## 2015-07-09 DIAGNOSIS — F329 Major depressive disorder, single episode, unspecified: Secondary | ICD-10-CM | POA: Diagnosis present

## 2015-07-09 DIAGNOSIS — J9601 Acute respiratory failure with hypoxia: Secondary | ICD-10-CM | POA: Diagnosis not present

## 2015-07-09 DIAGNOSIS — Z955 Presence of coronary angioplasty implant and graft: Secondary | ICD-10-CM

## 2015-07-09 DIAGNOSIS — E1140 Type 2 diabetes mellitus with diabetic neuropathy, unspecified: Secondary | ICD-10-CM | POA: Diagnosis present

## 2015-07-09 DIAGNOSIS — G4733 Obstructive sleep apnea (adult) (pediatric): Secondary | ICD-10-CM | POA: Diagnosis present

## 2015-07-09 DIAGNOSIS — Z6831 Body mass index (BMI) 31.0-31.9, adult: Secondary | ICD-10-CM

## 2015-07-09 DIAGNOSIS — K7460 Unspecified cirrhosis of liver: Secondary | ICD-10-CM | POA: Diagnosis present

## 2015-07-09 DIAGNOSIS — Z823 Family history of stroke: Secondary | ICD-10-CM

## 2015-07-09 DIAGNOSIS — E876 Hypokalemia: Secondary | ICD-10-CM | POA: Diagnosis not present

## 2015-07-09 DIAGNOSIS — K3580 Unspecified acute appendicitis: Secondary | ICD-10-CM | POA: Diagnosis not present

## 2015-07-09 DIAGNOSIS — Z8249 Family history of ischemic heart disease and other diseases of the circulatory system: Secondary | ICD-10-CM

## 2015-07-09 DIAGNOSIS — I2510 Atherosclerotic heart disease of native coronary artery without angina pectoris: Secondary | ICD-10-CM | POA: Diagnosis present

## 2015-07-09 DIAGNOSIS — K219 Gastro-esophageal reflux disease without esophagitis: Secondary | ICD-10-CM | POA: Diagnosis present

## 2015-07-09 DIAGNOSIS — Z87891 Personal history of nicotine dependence: Secondary | ICD-10-CM

## 2015-07-09 DIAGNOSIS — E1152 Type 2 diabetes mellitus with diabetic peripheral angiopathy with gangrene: Secondary | ICD-10-CM | POA: Diagnosis present

## 2015-07-09 DIAGNOSIS — E1165 Type 2 diabetes mellitus with hyperglycemia: Secondary | ICD-10-CM | POA: Diagnosis present

## 2015-07-09 DIAGNOSIS — Z794 Long term (current) use of insulin: Secondary | ICD-10-CM

## 2015-07-09 DIAGNOSIS — Z820 Family history of epilepsy and other diseases of the nervous system: Secondary | ICD-10-CM

## 2015-07-09 DIAGNOSIS — I252 Old myocardial infarction: Secondary | ICD-10-CM

## 2015-07-09 NOTE — ED Notes (Signed)
Pt. reports right lateral abdominal pain for 4 days with mild nausea , no emesis or diarrhea , denies fever or chills.

## 2015-07-09 NOTE — ED Notes (Signed)
Pt is aware of high blood pressure. States he forgot to take his blood pressure meds today.

## 2015-07-10 ENCOUNTER — Encounter (HOSPITAL_COMMUNITY): Admission: EM | Disposition: A | Discharge status: Home / Self Care | Payer: Self-pay | Source: Home / Self Care

## 2015-07-10 ENCOUNTER — Inpatient Hospital Stay (HOSPITAL_COMMUNITY): Payer: Medicare Other

## 2015-07-10 ENCOUNTER — Encounter (HOSPITAL_COMMUNITY): Payer: Self-pay

## 2015-07-10 ENCOUNTER — Emergency Department (HOSPITAL_COMMUNITY): Payer: Medicare Other

## 2015-07-10 ENCOUNTER — Observation Stay (HOSPITAL_COMMUNITY): Payer: Medicare Other | Admitting: Anesthesiology

## 2015-07-10 ENCOUNTER — Inpatient Hospital Stay (HOSPITAL_COMMUNITY)
Admission: EM | Admit: 2015-07-10 | Discharge: 2015-07-14 | DRG: 341 | Disposition: A | Discharge status: Home / Self Care | Payer: Medicare Other | Attending: General Surgery | Admitting: General Surgery

## 2015-07-10 DIAGNOSIS — I110 Hypertensive heart disease with heart failure: Secondary | ICD-10-CM | POA: Diagnosis present

## 2015-07-10 DIAGNOSIS — J9601 Acute respiratory failure with hypoxia: Secondary | ICD-10-CM | POA: Insufficient documentation

## 2015-07-10 DIAGNOSIS — E876 Hypokalemia: Secondary | ICD-10-CM | POA: Diagnosis not present

## 2015-07-10 DIAGNOSIS — J449 Chronic obstructive pulmonary disease, unspecified: Secondary | ICD-10-CM | POA: Diagnosis present

## 2015-07-10 DIAGNOSIS — Z87891 Personal history of nicotine dependence: Secondary | ICD-10-CM | POA: Diagnosis not present

## 2015-07-10 DIAGNOSIS — J9621 Acute and chronic respiratory failure with hypoxia: Secondary | ICD-10-CM | POA: Insufficient documentation

## 2015-07-10 DIAGNOSIS — Z01818 Encounter for other preprocedural examination: Secondary | ICD-10-CM

## 2015-07-10 DIAGNOSIS — E669 Obesity, unspecified: Secondary | ICD-10-CM | POA: Diagnosis present

## 2015-07-10 DIAGNOSIS — G4733 Obstructive sleep apnea (adult) (pediatric): Secondary | ICD-10-CM | POA: Diagnosis present

## 2015-07-10 DIAGNOSIS — I5032 Chronic diastolic (congestive) heart failure: Secondary | ICD-10-CM | POA: Insufficient documentation

## 2015-07-10 DIAGNOSIS — Z9049 Acquired absence of other specified parts of digestive tract: Secondary | ICD-10-CM

## 2015-07-10 DIAGNOSIS — M316 Other giant cell arteritis: Secondary | ICD-10-CM | POA: Diagnosis present

## 2015-07-10 DIAGNOSIS — Z823 Family history of stroke: Secondary | ICD-10-CM | POA: Diagnosis not present

## 2015-07-10 DIAGNOSIS — Z888 Allergy status to other drugs, medicaments and biological substances status: Secondary | ICD-10-CM | POA: Diagnosis not present

## 2015-07-10 DIAGNOSIS — E872 Acidosis, unspecified: Secondary | ICD-10-CM | POA: Insufficient documentation

## 2015-07-10 DIAGNOSIS — Z794 Long term (current) use of insulin: Secondary | ICD-10-CM | POA: Diagnosis not present

## 2015-07-10 DIAGNOSIS — K660 Peritoneal adhesions (postprocedural) (postinfection): Secondary | ICD-10-CM | POA: Diagnosis present

## 2015-07-10 DIAGNOSIS — K3589 Other acute appendicitis: Secondary | ICD-10-CM

## 2015-07-10 DIAGNOSIS — I2510 Atherosclerotic heart disease of native coronary artery without angina pectoris: Secondary | ICD-10-CM | POA: Diagnosis present

## 2015-07-10 DIAGNOSIS — K3580 Unspecified acute appendicitis: Secondary | ICD-10-CM | POA: Diagnosis present

## 2015-07-10 DIAGNOSIS — E118 Type 2 diabetes mellitus with unspecified complications: Secondary | ICD-10-CM | POA: Diagnosis not present

## 2015-07-10 DIAGNOSIS — K219 Gastro-esophageal reflux disease without esophagitis: Secondary | ICD-10-CM | POA: Diagnosis present

## 2015-07-10 DIAGNOSIS — F329 Major depressive disorder, single episode, unspecified: Secondary | ICD-10-CM | POA: Diagnosis present

## 2015-07-10 DIAGNOSIS — Z6831 Body mass index (BMI) 31.0-31.9, adult: Secondary | ICD-10-CM | POA: Diagnosis not present

## 2015-07-10 DIAGNOSIS — Z820 Family history of epilepsy and other diseases of the nervous system: Secondary | ICD-10-CM | POA: Diagnosis not present

## 2015-07-10 DIAGNOSIS — I252 Old myocardial infarction: Secondary | ICD-10-CM | POA: Diagnosis not present

## 2015-07-10 DIAGNOSIS — J9622 Acute and chronic respiratory failure with hypercapnia: Secondary | ICD-10-CM | POA: Insufficient documentation

## 2015-07-10 DIAGNOSIS — R0602 Shortness of breath: Secondary | ICD-10-CM

## 2015-07-10 DIAGNOSIS — E1140 Type 2 diabetes mellitus with diabetic neuropathy, unspecified: Secondary | ICD-10-CM | POA: Diagnosis present

## 2015-07-10 DIAGNOSIS — Z8673 Personal history of transient ischemic attack (TIA), and cerebral infarction without residual deficits: Secondary | ICD-10-CM | POA: Diagnosis not present

## 2015-07-10 DIAGNOSIS — Z955 Presence of coronary angioplasty implant and graft: Secondary | ICD-10-CM | POA: Diagnosis not present

## 2015-07-10 DIAGNOSIS — E1152 Type 2 diabetes mellitus with diabetic peripheral angiopathy with gangrene: Secondary | ICD-10-CM | POA: Diagnosis present

## 2015-07-10 DIAGNOSIS — E1165 Type 2 diabetes mellitus with hyperglycemia: Secondary | ICD-10-CM | POA: Diagnosis present

## 2015-07-10 DIAGNOSIS — K7581 Nonalcoholic steatohepatitis (NASH): Secondary | ICD-10-CM | POA: Diagnosis present

## 2015-07-10 DIAGNOSIS — K7460 Unspecified cirrhosis of liver: Secondary | ICD-10-CM | POA: Diagnosis present

## 2015-07-10 DIAGNOSIS — Z8249 Family history of ischemic heart disease and other diseases of the circulatory system: Secondary | ICD-10-CM | POA: Diagnosis not present

## 2015-07-10 DIAGNOSIS — J811 Chronic pulmonary edema: Secondary | ICD-10-CM

## 2015-07-10 HISTORY — DX: Obstructive sleep apnea (adult) (pediatric): G47.33

## 2015-07-10 HISTORY — DX: Other giant cell arteritis: M31.6

## 2015-07-10 HISTORY — DX: Depression, unspecified: F32.A

## 2015-07-10 HISTORY — DX: Transient cerebral ischemic attack, unspecified: G45.9

## 2015-07-10 HISTORY — DX: Major depressive disorder, single episode, unspecified: F32.9

## 2015-07-10 HISTORY — PX: LAPAROSCOPIC APPENDECTOMY: SHX408

## 2015-07-10 HISTORY — DX: Type 2 diabetes mellitus with diabetic neuropathy, unspecified: E11.40

## 2015-07-10 HISTORY — DX: Other specified diabetes mellitus with diabetic peripheral angiopathy without gangrene: E13.51

## 2015-07-10 HISTORY — DX: Splenomegaly, not elsewhere classified: R16.1

## 2015-07-10 HISTORY — DX: Unspecified cirrhosis of liver: K74.60

## 2015-07-10 LAB — BLOOD GAS, ARTERIAL
ACID-BASE DEFICIT: 2.3 mmol/L — AB (ref 0.0–2.0)
Acid-base deficit: 2.8 mmol/L — ABNORMAL HIGH (ref 0.0–2.0)
BICARBONATE: 22.3 meq/L (ref 20.0–24.0)
Bicarbonate: 22.9 mEq/L (ref 20.0–24.0)
DELIVERY SYSTEMS: POSITIVE
Drawn by: 331761
Drawn by: 27022
EXPIRATORY PAP: 18
FIO2: 1
LHR: 16 {breaths}/min
MECHVT: 560 mL
O2 Content: 15 L/min
O2 SAT: 99.9 %
O2 Saturation: 87 %
PATIENT TEMPERATURE: 98.6
PCO2 ART: 46.1 mmHg — AB (ref 35.0–45.0)
PEEP/CPAP: 10 cmH2O
PH ART: 7.332 — AB (ref 7.350–7.450)
PH ART: 7.317 — AB (ref 7.350–7.450)
PO2 ART: 55.3 mmHg — AB (ref 80.0–100.0)
PO2 ART: 253 mmHg — AB (ref 80.0–100.0)
Patient temperature: 97.4
TCO2: 23.6 mmol/L (ref 0–100)
TCO2: 24.3 mmol/L (ref 0–100)
pCO2 arterial: 42.8 mmHg (ref 35.0–45.0)

## 2015-07-10 LAB — COMPREHENSIVE METABOLIC PANEL
ALBUMIN: 4.1 g/dL (ref 3.5–5.0)
ALK PHOS: 105 U/L (ref 38–126)
ALT: 47 U/L (ref 17–63)
ANION GAP: 14 (ref 5–15)
AST: 31 U/L (ref 15–41)
BUN: 19 mg/dL (ref 6–20)
CALCIUM: 10 mg/dL (ref 8.9–10.3)
CO2: 23 mmol/L (ref 22–32)
CREATININE: 1.19 mg/dL (ref 0.61–1.24)
Chloride: 98 mmol/L — ABNORMAL LOW (ref 101–111)
Glucose, Bld: 311 mg/dL — ABNORMAL HIGH (ref 65–99)
Potassium: 4.2 mmol/L (ref 3.5–5.1)
SODIUM: 135 mmol/L (ref 135–145)
TOTAL PROTEIN: 7.6 g/dL (ref 6.5–8.1)
Total Bilirubin: 1.6 mg/dL — ABNORMAL HIGH (ref 0.3–1.2)

## 2015-07-10 LAB — GLUCOSE, CAPILLARY
GLUCOSE-CAPILLARY: 208 mg/dL — AB (ref 65–99)
GLUCOSE-CAPILLARY: 275 mg/dL — AB (ref 65–99)
Glucose-Capillary: 222 mg/dL — ABNORMAL HIGH (ref 65–99)
Glucose-Capillary: 273 mg/dL — ABNORMAL HIGH (ref 65–99)
Glucose-Capillary: 292 mg/dL — ABNORMAL HIGH (ref 65–99)

## 2015-07-10 LAB — URINE MICROSCOPIC-ADD ON
RBC / HPF: NONE SEEN RBC/hpf (ref 0–5)
WBC UA: NONE SEEN WBC/hpf (ref 0–5)

## 2015-07-10 LAB — PROTIME-INR
INR: 1.18 (ref 0.00–1.49)
Prothrombin Time: 15.1 seconds (ref 11.6–15.2)

## 2015-07-10 LAB — CBC
HCT: 45 % (ref 39.0–52.0)
Hemoglobin: 16.1 g/dL (ref 13.0–17.0)
MCH: 32.7 pg (ref 26.0–34.0)
MCHC: 35.8 g/dL (ref 30.0–36.0)
MCV: 91.5 fL (ref 78.0–100.0)
Platelets: 167 10*3/uL (ref 150–400)
RBC: 4.92 MIL/uL (ref 4.22–5.81)
RDW: 14.9 % (ref 11.5–15.5)
WBC: 12.1 10*3/uL — AB (ref 4.0–10.5)

## 2015-07-10 LAB — I-STAT CG4 LACTIC ACID, ED
Lactic Acid, Venous: 2.18 mmol/L (ref 0.5–2.0)
Lactic Acid, Venous: 2.16 mmol/L (ref 0.5–2.0)

## 2015-07-10 LAB — URINALYSIS, ROUTINE W REFLEX MICROSCOPIC
Bilirubin Urine: NEGATIVE
Glucose, UA: >1000 mg/dL — AB
Ketones, ur: 15 mg/dL — AB
Leukocytes, UA: NEGATIVE
NITRITE: NEGATIVE
Specific Gravity, Urine: 1.033 — ABNORMAL HIGH (ref 1.005–1.030)
pH: 5 (ref 5.0–8.0)

## 2015-07-10 LAB — PROCALCITONIN: Procalcitonin: 0.25 ng/mL

## 2015-07-10 LAB — MRSA PCR SCREENING: MRSA by PCR: NEGATIVE

## 2015-07-10 LAB — LACTIC ACID, PLASMA: Lactic Acid, Venous: 1.7 mmol/L (ref 0.5–2.0)

## 2015-07-10 LAB — LIPASE, BLOOD: Lipase: 55 U/L — ABNORMAL HIGH (ref 11–51)

## 2015-07-10 LAB — BRAIN NATRIURETIC PEPTIDE: B Natriuretic Peptide: 436.5 pg/mL — ABNORMAL HIGH (ref 0.0–100.0)

## 2015-07-10 SURGERY — APPENDECTOMY, LAPAROSCOPIC
Anesthesia: General | Site: Abdomen

## 2015-07-10 MED ORDER — HYDRALAZINE HCL 20 MG/ML IJ SOLN: 10.0000 mg | INTRAMUSCULAR | Status: DC | PRN

## 2015-07-10 MED ORDER — SODIUM CHLORIDE 0.9 % IV SOLN
Freq: Once | INTRAVENOUS | Status: AC
  Administered 2015-07-10: 03:00:00 via INTRAVENOUS

## 2015-07-10 MED ORDER — ONDANSETRON HCL 4 MG/2ML IJ SOLN
INTRAMUSCULAR | Status: AC
  Filled 2015-07-10: qty 2

## 2015-07-10 MED ORDER — METOPROLOL TARTRATE 1 MG/ML IV SOLN: 5.0000 mg | Freq: Three times a day (TID) | INTRAVENOUS | Status: DC

## 2015-07-10 MED ORDER — KCL IN DEXTROSE-NACL 20-5-0.45 MEQ/L-%-% IV SOLN: INTRAVENOUS | Status: DC

## 2015-07-10 MED ORDER — METHOCARBAMOL 500 MG PO TABS: 500.0000 mg | ORAL_TABLET | Freq: Four times a day (QID) | ORAL | Status: DC | PRN

## 2015-07-10 MED ORDER — PROPOFOL 10 MG/ML IV BOLUS
INTRAVENOUS | Status: AC
  Filled 2015-07-10: qty 20

## 2015-07-10 MED ORDER — DOCUSATE SODIUM 100 MG PO CAPS: 100.0000 mg | ORAL_CAPSULE | Freq: Two times a day (BID) | ORAL | Status: DC

## 2015-07-10 MED ORDER — METOPROLOL TARTRATE 50 MG PO TABS
ORAL_TABLET | ORAL | Status: AC
Start: 2015-07-10 — End: 2015-07-10
  Administered 2015-07-10: 50 mg via ORAL
  Filled 2015-07-10: qty 1

## 2015-07-10 MED ORDER — ACETAMINOPHEN 325 MG PO TABS: 650.0000 mg | ORAL_TABLET | Freq: Four times a day (QID) | ORAL | Status: DC | PRN

## 2015-07-10 MED ORDER — CHLORHEXIDINE GLUCONATE 0.12% ORAL RINSE (MEDLINE KIT)
15.0000 mL | Freq: Two times a day (BID) | OROMUCOSAL | Status: DC
  Administered 2015-07-10 – 2015-07-11 (×2): 15 mL via OROMUCOSAL

## 2015-07-10 MED ORDER — FENTANYL CITRATE (PF) 100 MCG/2ML IJ SOLN
25.0000 ug | INTRAMUSCULAR | Status: DC | PRN
  Administered 2015-07-10 – 2015-07-11 (×3): 50 ug via INTRAVENOUS
  Administered 2015-07-11: 25 ug via INTRAVENOUS
  Administered 2015-07-11 (×2): 50 ug via INTRAVENOUS
  Filled 2015-07-10 (×5): qty 2

## 2015-07-10 MED ORDER — GLYCOPYRROLATE 0.2 MG/ML IJ SOLN
INTRAMUSCULAR | Status: DC | PRN
  Administered 2015-07-10: 0.4 mg via INTRAVENOUS

## 2015-07-10 MED ORDER — SODIUM CHLORIDE 0.9 % IV BOLUS (SEPSIS)
1000.0000 mL | Freq: Once | INTRAVENOUS | Status: AC
  Administered 2015-07-10: 1000 mL via INTRAVENOUS

## 2015-07-10 MED ORDER — FUROSEMIDE 10 MG/ML IJ SOLN
INTRAMUSCULAR | Status: AC
  Administered 2015-07-10: 20 mg via INTRAVENOUS
  Filled 2015-07-10: qty 4

## 2015-07-10 MED ORDER — ONDANSETRON HCL 4 MG/2ML IJ SOLN
4.0000 mg | Freq: Once | INTRAMUSCULAR | Status: AC
  Administered 2015-07-10: 4 mg via INTRAVENOUS
  Filled 2015-07-10: qty 2

## 2015-07-10 MED ORDER — SUGAMMADEX SODIUM 200 MG/2ML IV SOLN
INTRAVENOUS | Status: DC | PRN
  Administered 2015-07-10: 200 mg via INTRAVENOUS

## 2015-07-10 MED ORDER — METOPROLOL TARTRATE 50 MG PO TABS
50.0000 mg | ORAL_TABLET | Freq: Once | ORAL | Status: AC
  Administered 2015-07-10: 50 mg via ORAL

## 2015-07-10 MED ORDER — FENTANYL CITRATE (PF) 100 MCG/2ML IJ SOLN
50.0000 ug | Freq: Once | INTRAMUSCULAR | Status: AC
  Administered 2015-07-10: 50 ug via INTRAVENOUS

## 2015-07-10 MED ORDER — OXYCODONE HCL 5 MG PO TABS: 5.0000 mg | ORAL_TABLET | ORAL | Status: DC | PRN

## 2015-07-10 MED ORDER — METRONIDAZOLE IN NACL 5-0.79 MG/ML-% IV SOLN
500.0000 mg | Freq: Once | INTRAVENOUS | Status: AC
  Administered 2015-07-10: 500 mg via INTRAVENOUS
  Filled 2015-07-10: qty 100

## 2015-07-10 MED ORDER — ONDANSETRON 4 MG PO TBDP: 4.0000 mg | ORAL_TABLET | Freq: Four times a day (QID) | ORAL | Status: DC | PRN

## 2015-07-10 MED ORDER — SUCCINYLCHOLINE CHLORIDE 20 MG/ML IJ SOLN
INTRAMUSCULAR | Status: DC | PRN
  Administered 2015-07-10: 100 mg via INTRAVENOUS
  Administered 2015-07-10: 140 mg via INTRAVENOUS

## 2015-07-10 MED ORDER — MIDAZOLAM HCL 5 MG/5ML IJ SOLN
INTRAMUSCULAR | Status: DC | PRN
  Administered 2015-07-10: 2 mg via INTRAVENOUS

## 2015-07-10 MED ORDER — PROPOFOL 10 MG/ML IV BOLUS
INTRAVENOUS | Status: DC | PRN
  Administered 2015-07-10: 170 mg via INTRAVENOUS
  Administered 2015-07-10: 100 mg via INTRAVENOUS

## 2015-07-10 MED ORDER — METRONIDAZOLE IN NACL 5-0.79 MG/ML-% IV SOLN
500.0000 mg | Freq: Three times a day (TID) | INTRAVENOUS | Status: DC
  Administered 2015-07-10: 500 mg via INTRAVENOUS
  Filled 2015-07-10: qty 100

## 2015-07-10 MED ORDER — OXYCODONE HCL 5 MG PO TABS: 5.0000 mg | ORAL_TABLET | Freq: Once | ORAL | Status: DC | PRN

## 2015-07-10 MED ORDER — SODIUM CHLORIDE 0.9 % IV SOLN
INTRAVENOUS | Status: DC
  Administered 2015-07-10 – 2015-07-13 (×2): via INTRAVENOUS

## 2015-07-10 MED ORDER — FENTANYL CITRATE (PF) 100 MCG/2ML IJ SOLN
INTRAMUSCULAR | Status: AC
  Filled 2015-07-10: qty 2

## 2015-07-10 MED ORDER — INSULIN ASPART 100 UNIT/ML ~~LOC~~ SOLN: 0.0000 [IU] | Freq: Three times a day (TID) | SUBCUTANEOUS | Status: DC

## 2015-07-10 MED ORDER — ONDANSETRON HCL 4 MG/2ML IJ SOLN
INTRAMUSCULAR | Status: DC | PRN
  Administered 2015-07-10: 4 mg via INTRAVENOUS

## 2015-07-10 MED ORDER — INSULIN ASPART 100 UNIT/ML ~~LOC~~ SOLN
0.0000 [IU] | SUBCUTANEOUS | Status: DC
  Administered 2015-07-10: 5 [IU] via SUBCUTANEOUS
  Administered 2015-07-10: 3 [IU] via SUBCUTANEOUS
  Administered 2015-07-10: 5 [IU] via SUBCUTANEOUS
  Administered 2015-07-11 (×2): 3 [IU] via SUBCUTANEOUS

## 2015-07-10 MED ORDER — FUROSEMIDE 20 MG PO TABS: 20.0000 mg | ORAL_TABLET | Freq: Every day | ORAL | Status: DC

## 2015-07-10 MED ORDER — LIDOCAINE HCL (CARDIAC) 20 MG/ML IV SOLN
INTRAVENOUS | Status: DC | PRN
  Administered 2015-07-10: 60 mg via INTRAVENOUS

## 2015-07-10 MED ORDER — ANTISEPTIC ORAL RINSE SOLUTION (CORINZ)
7.0000 mL | Freq: Four times a day (QID) | OROMUCOSAL | Status: DC
  Administered 2015-07-10 – 2015-07-11 (×2): 7 mL via OROMUCOSAL

## 2015-07-10 MED ORDER — NEOSTIGMINE METHYLSULFATE 10 MG/10ML IV SOLN
INTRAVENOUS | Status: DC | PRN
  Administered 2015-07-10: 3 mg via INTRAVENOUS

## 2015-07-10 MED ORDER — DIPHENHYDRAMINE HCL 25 MG PO CAPS: 25.0000 mg | ORAL_CAPSULE | Freq: Four times a day (QID) | ORAL | Status: DC | PRN

## 2015-07-10 MED ORDER — SUGAMMADEX SODIUM 200 MG/2ML IV SOLN
INTRAVENOUS | Status: AC
  Filled 2015-07-10: qty 2

## 2015-07-10 MED ORDER — ROCURONIUM BROMIDE 50 MG/5ML IV SOLN
INTRAVENOUS | Status: AC
  Filled 2015-07-10: qty 1

## 2015-07-10 MED ORDER — PROMETHAZINE HCL 25 MG/ML IJ SOLN: 6.2500 mg | INTRAMUSCULAR | Status: DC | PRN

## 2015-07-10 MED ORDER — ROCURONIUM BROMIDE 100 MG/10ML IV SOLN
INTRAVENOUS | Status: DC | PRN
  Administered 2015-07-10: 30 mg via INTRAVENOUS

## 2015-07-10 MED ORDER — METOPROLOL TARTRATE 50 MG PO TABS
50.0000 mg | ORAL_TABLET | Freq: Two times a day (BID) | ORAL | Status: DC
  Filled 2015-07-10 (×2): qty 1

## 2015-07-10 MED ORDER — FUROSEMIDE 10 MG/ML IJ SOLN
20.0000 mg | Freq: Once | INTRAMUSCULAR | Status: AC
  Administered 2015-07-10: 20 mg via INTRAVENOUS

## 2015-07-10 MED ORDER — HYDROMORPHONE HCL 1 MG/ML IJ SOLN
0.2500 mg | INTRAMUSCULAR | Status: DC | PRN
  Administered 2015-07-10 (×2): 0.5 mg via INTRAVENOUS

## 2015-07-10 MED ORDER — LIDOCAINE HCL (CARDIAC) 20 MG/ML IV SOLN
INTRAVENOUS | Status: AC
  Filled 2015-07-10: qty 5

## 2015-07-10 MED ORDER — CITALOPRAM HYDROBROMIDE 20 MG PO TABS
40.0000 mg | ORAL_TABLET | Freq: Every day | ORAL | Status: DC
  Administered 2015-07-12 – 2015-07-14 (×3): 40 mg via ORAL
  Filled 2015-07-10: qty 2
  Filled 2015-07-10 (×2): qty 1
  Filled 2015-07-10: qty 2
  Filled 2015-07-10: qty 1

## 2015-07-10 MED ORDER — FENTANYL CITRATE (PF) 250 MCG/5ML IJ SOLN
INTRAMUSCULAR | Status: DC | PRN
  Administered 2015-07-10: 50 ug via INTRAVENOUS
  Administered 2015-07-10: 150 ug via INTRAVENOUS

## 2015-07-10 MED ORDER — PROPOFOL 1000 MG/100ML IV EMUL
0.0000 ug/kg/min | INTRAVENOUS | Status: DC
  Administered 2015-07-10: 50 ug/kg/min via INTRAVENOUS
  Administered 2015-07-10: 45 ug/kg/min via INTRAVENOUS
  Administered 2015-07-10 – 2015-07-11 (×2): 50 ug/kg/min via INTRAVENOUS
  Administered 2015-07-11 (×2): 45 ug/kg/min via INTRAVENOUS
  Filled 2015-07-10 (×5): qty 100

## 2015-07-10 MED ORDER — IOHEXOL 300 MG/ML  SOLN
100.0000 mL | Freq: Once | INTRAMUSCULAR | Status: AC | PRN
  Administered 2015-07-10: 100 mL via INTRAVENOUS

## 2015-07-10 MED ORDER — BISACODYL 10 MG RE SUPP: 10.0000 mg | Freq: Every day | RECTAL | Status: DC | PRN

## 2015-07-10 MED ORDER — NITROGLYCERIN 0.4 MG SL SUBL: 0.4000 mg | SUBLINGUAL_TABLET | SUBLINGUAL | Status: DC | PRN

## 2015-07-10 MED ORDER — MIDAZOLAM HCL 2 MG/2ML IJ SOLN
INTRAMUSCULAR | Status: AC
Start: 2015-07-10 — End: 2015-07-10
  Filled 2015-07-10: qty 2

## 2015-07-10 MED ORDER — DOCUSATE SODIUM 50 MG/5ML PO LIQD: 100.0000 mg | Freq: Two times a day (BID) | ORAL | Status: DC | PRN

## 2015-07-10 MED ORDER — METRONIDAZOLE IN NACL 5-0.79 MG/ML-% IV SOLN
500.0000 mg | Freq: Three times a day (TID) | INTRAVENOUS | Status: DC
  Administered 2015-07-11 – 2015-07-13 (×7): 500 mg via INTRAVENOUS
  Filled 2015-07-10 (×8): qty 100

## 2015-07-10 MED ORDER — ONDANSETRON HCL 4 MG/2ML IJ SOLN: 4.0000 mg | Freq: Four times a day (QID) | INTRAMUSCULAR | Status: DC | PRN

## 2015-07-10 MED ORDER — DEXTROSE 5 % IV SOLN
2.0000 g | Freq: Once | INTRAVENOUS | Status: AC
  Administered 2015-07-10: 2 g via INTRAVENOUS
  Filled 2015-07-10: qty 2

## 2015-07-10 MED ORDER — PANTOPRAZOLE SODIUM 40 MG IV SOLR
40.0000 mg | Freq: Every day | INTRAVENOUS | Status: DC
  Administered 2015-07-10 – 2015-07-12 (×3): 40 mg via INTRAVENOUS
  Filled 2015-07-10 (×3): qty 40

## 2015-07-10 MED ORDER — LACTATED RINGERS IV SOLN
INTRAVENOUS | Status: DC
  Administered 2015-07-10: 10:00:00 via INTRAVENOUS

## 2015-07-10 MED ORDER — METRONIDAZOLE IN NACL 5-0.79 MG/ML-% IV SOLN: 500.0000 mg | Freq: Three times a day (TID) | INTRAVENOUS | Status: DC

## 2015-07-10 MED ORDER — FUROSEMIDE 10 MG/ML IJ SOLN
INTRAMUSCULAR | Status: DC | PRN
  Administered 2015-07-10: 10 mg via INTRAMUSCULAR

## 2015-07-10 MED ORDER — HYDROMORPHONE HCL 1 MG/ML IJ SOLN
1.0000 mg | Freq: Once | INTRAMUSCULAR | Status: AC
  Administered 2015-07-10: 1 mg via INTRAVENOUS
  Filled 2015-07-10: qty 1

## 2015-07-10 MED ORDER — GABAPENTIN 300 MG PO CAPS: 900.0000 mg | ORAL_CAPSULE | Freq: Three times a day (TID) | ORAL | Status: DC

## 2015-07-10 MED ORDER — NEOSTIGMINE METHYLSULFATE 10 MG/10ML IV SOLN
INTRAVENOUS | Status: AC
  Filled 2015-07-10: qty 1

## 2015-07-10 MED ORDER — BUPIVACAINE-EPINEPHRINE (PF) 0.25% -1:200000 IJ SOLN
INTRAMUSCULAR | Status: AC
  Filled 2015-07-10: qty 30

## 2015-07-10 MED ORDER — ANTISEPTIC ORAL RINSE SOLUTION (CORINZ)
7.0000 mL | Freq: Four times a day (QID) | OROMUCOSAL | Status: DC
Start: 2015-07-11 — End: 2015-07-10

## 2015-07-10 MED ORDER — BUPIVACAINE-EPINEPHRINE 0.25% -1:200000 IJ SOLN
INTRAMUSCULAR | Status: DC | PRN
  Administered 2015-07-10: 30 mL

## 2015-07-10 MED ORDER — MORPHINE SULFATE (PF) 2 MG/ML IV SOLN: 1.0000 mg | INTRAVENOUS | Status: DC | PRN

## 2015-07-10 MED ORDER — PHENYLEPHRINE HCL 10 MG/ML IJ SOLN
INTRAMUSCULAR | Status: DC | PRN
  Administered 2015-07-10 (×3): 80 ug via INTRAVENOUS

## 2015-07-10 MED ORDER — FENTANYL CITRATE (PF) 250 MCG/5ML IJ SOLN
INTRAMUSCULAR | Status: AC
  Filled 2015-07-10: qty 5

## 2015-07-10 MED ORDER — SIMETHICONE 80 MG PO CHEW: 40.0000 mg | CHEWABLE_TABLET | Freq: Four times a day (QID) | ORAL | Status: DC | PRN

## 2015-07-10 MED ORDER — CHLORHEXIDINE GLUCONATE 0.12% ORAL RINSE (MEDLINE KIT): 15.0000 mL | Freq: Two times a day (BID) | OROMUCOSAL | Status: DC

## 2015-07-10 MED ORDER — SODIUM CHLORIDE 0.9 % IR SOLN
Status: DC | PRN
  Administered 2015-07-10: 1000 mL

## 2015-07-10 MED ORDER — FUROSEMIDE 10 MG/ML IJ SOLN
INTRAMUSCULAR | Status: AC
  Filled 2015-07-10: qty 2

## 2015-07-10 MED ORDER — GLYCOPYRROLATE 0.2 MG/ML IJ SOLN
INTRAMUSCULAR | Status: AC
  Filled 2015-07-10: qty 2

## 2015-07-10 MED ORDER — CEFTRIAXONE SODIUM 2 G IJ SOLR
2.0000 g | INTRAMUSCULAR | Status: DC
  Administered 2015-07-11 – 2015-07-14 (×4): 2 g via INTRAVENOUS
  Filled 2015-07-10 (×5): qty 2

## 2015-07-10 MED ORDER — INSULIN ASPART 100 UNIT/ML ~~LOC~~ SOLN: 0.0000 [IU] | SUBCUTANEOUS | Status: DC

## 2015-07-10 MED ORDER — DEXTROSE 5 % IV SOLN: 2.0000 g | INTRAVENOUS | Status: DC

## 2015-07-10 MED ORDER — INSULIN ASPART 100 UNIT/ML ~~LOC~~ SOLN: 0.0000 [IU] | Freq: Every day | SUBCUTANEOUS | Status: DC

## 2015-07-10 MED ORDER — HYDROMORPHONE HCL 1 MG/ML IJ SOLN
INTRAMUSCULAR | Status: AC
  Administered 2015-07-10: 0.5 mg via INTRAVENOUS
  Filled 2015-07-10: qty 1

## 2015-07-10 MED ORDER — OXYCODONE HCL 5 MG/5ML PO SOLN: 5.0000 mg | Freq: Once | ORAL | Status: DC | PRN

## 2015-07-10 MED ORDER — ACETAMINOPHEN 650 MG RE SUPP: 650.0000 mg | Freq: Four times a day (QID) | RECTAL | Status: DC | PRN

## 2015-07-10 MED ORDER — 0.9 % SODIUM CHLORIDE (POUR BTL) OPTIME
TOPICAL | Status: DC | PRN
  Administered 2015-07-10: 1000 mL

## 2015-07-10 MED ORDER — DIPHENHYDRAMINE HCL 50 MG/ML IJ SOLN: 25.0000 mg | Freq: Four times a day (QID) | INTRAMUSCULAR | Status: DC | PRN

## 2015-07-10 SURGICAL SUPPLY — 43 items
APPLIER CLIP ROT 10 11.4 M/L (STAPLE) ×4
CANISTER SUCTION 2500CC (MISCELLANEOUS) ×4 IMPLANT
CHLORAPREP W/TINT 26ML (MISCELLANEOUS) ×4 IMPLANT
CLIP APPLIE ROT 10 11.4 M/L (STAPLE) ×2 IMPLANT
CLOSURE WOUND 1/2 X4 (GAUZE/BANDAGES/DRESSINGS) ×1
COVER SURGICAL LIGHT HANDLE (MISCELLANEOUS) ×4 IMPLANT
CUTTER FLEX LINEAR 45M (STAPLE) ×4 IMPLANT
DEVICE TROCAR PUNCTURE CLOSURE (ENDOMECHANICALS) ×4 IMPLANT
ELECT REM PT RETURN 9FT ADLT (ELECTROSURGICAL) ×4
ELECTRODE REM PT RTRN 9FT ADLT (ELECTROSURGICAL) ×2 IMPLANT
GAUZE SPONGE 4X4 12PLY STRL (GAUZE/BANDAGES/DRESSINGS) ×4 IMPLANT
GLOVE BIO SURGEON STRL SZ7 (GLOVE) ×8 IMPLANT
GLOVE BIOGEL PI IND STRL 7.0 (GLOVE) ×2 IMPLANT
GLOVE BIOGEL PI IND STRL 7.5 (GLOVE) ×2 IMPLANT
GLOVE BIOGEL PI INDICATOR 7.0 (GLOVE) ×2
GLOVE BIOGEL PI INDICATOR 7.5 (GLOVE) ×2
GLOVE SURG SS PI 6.5 STRL IVOR (GLOVE) ×4 IMPLANT
GOWN STRL REUS W/ TWL LRG LVL3 (GOWN DISPOSABLE) ×6 IMPLANT
GOWN STRL REUS W/TWL LRG LVL3 (GOWN DISPOSABLE) ×6
GOWN SURG REUSE MED LVL3 (GOWN DISPOSABLE) ×4 IMPLANT
KIT BASIN OR (CUSTOM PROCEDURE TRAY) ×4 IMPLANT
KIT ROOM TURNOVER OR (KITS) ×4 IMPLANT
LIQUID BAND (GAUZE/BANDAGES/DRESSINGS) ×4 IMPLANT
NS IRRIG 1000ML POUR BTL (IV SOLUTION) ×4 IMPLANT
PAD ARMBOARD 7.5X6 YLW CONV (MISCELLANEOUS) ×8 IMPLANT
POUCH RETRIEVAL ECOSAC 10 (ENDOMECHANICALS) ×2 IMPLANT
POUCH RETRIEVAL ECOSAC 10MM (ENDOMECHANICALS) ×2
RELOAD 45 VASCULAR/THIN (ENDOMECHANICALS) ×4 IMPLANT
RELOAD STAPLE TA45 3.5 REG BLU (ENDOMECHANICALS) ×8 IMPLANT
SCALPEL HARMONIC ACE (MISCELLANEOUS) ×4 IMPLANT
SET IRRIG TUBING LAPAROSCOPIC (IRRIGATION / IRRIGATOR) ×4 IMPLANT
SLEEVE ENDOPATH XCEL 5M (ENDOMECHANICALS) ×4 IMPLANT
SPECIMEN JAR SMALL (MISCELLANEOUS) ×4 IMPLANT
STRIP CLOSURE SKIN 1/2X4 (GAUZE/BANDAGES/DRESSINGS) ×3 IMPLANT
SUT MNCRL AB 4-0 PS2 18 (SUTURE) ×4 IMPLANT
SUT VICRYL 0 UR6 27IN ABS (SUTURE) ×8 IMPLANT
TOWEL OR 17X24 6PK STRL BLUE (TOWEL DISPOSABLE) ×4 IMPLANT
TOWEL OR 17X26 10 PK STRL BLUE (TOWEL DISPOSABLE) ×4 IMPLANT
TRAY FOLEY CATH 16FR SILVER (SET/KITS/TRAYS/PACK) ×4 IMPLANT
TRAY LAPAROSCOPIC MC (CUSTOM PROCEDURE TRAY) ×4 IMPLANT
TROCAR XCEL BLUNT TIP 100MML (ENDOMECHANICALS) ×4 IMPLANT
TROCAR XCEL NON-BLD 5MMX100MML (ENDOMECHANICALS) ×4 IMPLANT
TUBING INSUFFLATION (TUBING) ×4 IMPLANT

## 2015-07-10 NOTE — Consult Note (Addendum)
PULMONARY / CRITICAL CARE MEDICINE   Name: Larry Terrell MRN: [id number] DOB: Jul 18, 1960    ADMISSION DATE:  07/10/2015 CONSULTATION DATE:  07/10/2015  REFERRING MD:  Rolm Bookbinder, M.D. / Surgery Service  CHIEF COMPLAINT:  Acute Hypoxic Respiratory Failure/Medical Management  STUDIES:  TTE (02/07/14 @ [facility]):  LV normal in size w/ EF 55-60%. Diastolic dysfunction. LA dilated. RA appeared normal in size. RV normal in size & function. No pericardial effusion. CT Abd/Pelvis W/ Contrast 12/28:  Diffuse hepatic steatosis. Gallbladder unremarkable w/ diffuse wall thickening. Splenomegaly to 17cm greatest length. Sigmoid diverticulosis without inflammation. Mild prostate enlargement. Enlarged & inflamed appendix without fluid collection or abscess. Aortobifemoral bypass noted. Port CXR 12/28:  Personally reviewed by me. Low lung volumes patchy alveolar opacities with more confluent opacity in right mid-lung zone. No obvious pleural effusion but suggestion of fluid in fissure on right. Port CXR 12/28:  Personally reviewed by me. Low lung volumes. Patchy bilateral alveolar opacities persists as well as more confluent opacity in right lower lung zone. ETT appears to be about 2.5cm from carina.  CULTURES: Blood x2 12/28>>>  ANTIBIOTICS: Rocephin 12/28>>> Flagyl 12/28>>>  SIGNIFICANT EVENTS: 12/28 - Admit s/p Lap Appendectomy with Respiratory Failure & Intubation  LINES/TUBES: OETT 7.5 12/28>>> Foley Catheter 12/28>>> PIV x1>>>  HISTORY OF PRESENT ILLNESS:  History obtained from Dr. Donne Hazel as well as the patient's electronic medical record as he is currently intubated and unable to provide history. Patient with known history of multiple medical problems presented with a three-day history of abdominal pain progressively worsening. Patient also endorses nausea without vomiting. He denied any fever or chills. The patient denied any chest pain or shortness of breath. Patient had been taking  tramadol as an outpatient but this did not relieve his pain. On presentation to the emergency department he was found to have acute appendicitis and was taken to the operating room for laparoscopic appendectomy. This was completed successfully and the patient was subsequently extubated. He was hypoxic per records before leaving the OR and was given Lasix 52m IV x1 and placed on CPAP at 18cm H2O. However he developed worsening acute hypoxic respiratory failure requiring repeat intubation in the PACU by anesthesia.  PAST MEDICAL HISTORY :  Past Medical History  Diagnosis Date  . Heart attack ([place]   . CHF (congestive heart failure) (HCC)     [id number] 55-60%  . Neuropathy ([facility]   . COPD (chronic obstructive pulmonary disease) ([facility]   . Diabetes mellitus with neuropathy ([place]   . Temporal giant cell arteritis ([place]   . Depression   . OSA (obstructive sleep apnea)   . Peripheral vascular disease due to secondary diabetes mellitus ([place]   . Cirrhosis ([place]     NASH per records  . TIA (transient ischemic attack) April 2016  . Splenomegaly     PAST SURGICAL HISTORY: Past Surgical History  Procedure Laterality Date  . Cardiac stents  July 2015   . Cardiac catheterization    . Aorta - femoral artery bypass graft Bilateral     Allergies  Allergen Reactions  . Ace Inhibitors Other (See Comments)    Pt reports ace inhibitors make him cough    No current facility-administered medications on file prior to encounter.   Current Outpatient Prescriptions on File Prior to Encounter  Medication Sig  . traMADol (ULTRAM) 50 MG tablet Take 1 tablet (50 mg total) by mouth every 6 (six) hours as needed. (Patient taking differently:  Take 50 mg by mouth every 6 (six) hours as needed for moderate pain. )    FAMILY HISTORY:  Family History  Problem Relation Age of Onset  . Heart attack Mother 47  . Stroke Mother   . Heart attack Father   . Alzheimer's disease Father      SOCIAL HISTORY: Social History  Substance Use Topics  . Smoking status: Former Research scientist (life sciences)  . Smokeless tobacco: None     Comment: Quit [id number] per records  . Alcohol Use: No    REVIEW OF SYSTEMS:  Unable to obtain review of systems given intubated status.  SUBJECTIVE:   VITAL SIGNS: BP 90/58 mmHg  Pulse 83  Temp(Src) 97.7 F (36.5 C) (Oral)  Resp 16  Ht 5' 9"  (1.753 m)  Wt 216 lb (97.977 kg)  BMI 31.88 kg/m2  SpO2 98%  HEMODYNAMICS:    VENTILATOR SETTINGS: Vent Mode:  [-] PRVC FiO2 (%):  [100 %] 100 % Set Rate:  [18 bmp] 18 bmp Vt Set:  [560 mL] 560 mL PEEP:  [10 cmH20] 10 cmH20 Plateau Pressure:  [23 cmH20] 23 cmH20  INTAKE / OUTPUT:    PHYSICAL EXAMINATION: General:  Sedated. No distress. No family at bedside.  Integument:  Warm & dry. No rash on exposed skin.  Lymphatics:  No appreciated cervical or supraclavicular lymphadenoapthy. HEENT:  No scleral injection or icterus. Endotracheal tube in place. Cardiovascular:  Regular rate. No edema. No appreciable JVD given body habitus.  Pulmonary:  Coarse breath sounds bilaterally. Symmetric chest wall rise on ventilator. Abdomen: Soft. Protuberant. Musculoskeletal:  No joint deformity or effusion appreciated. Neurological:  Sedated. No meningismus.  Psychiatric:  Unable to assess given intubation & sedation.  LABS:  BMET  Recent Labs Lab 07/09/15 2354  NA 135  K 4.2  CL 98*  CO2 23  BUN 19  CREATININE 1.19  GLUCOSE 311*    Electrolytes  Recent Labs Lab 07/09/15 2354  CALCIUM 10.0    CBC  Recent Labs Lab 07/09/15 2354  WBC 12.1*  HGB 16.1  HCT 45.0  PLT 167    Coag's  Recent Labs Lab 07/10/15 0937  INR 1.18    Sepsis Markers  Recent Labs Lab 07/10/15 0253 07/10/15 0527 07/10/15 0800  LATICACIDVEN 2.18* 2.16* 1.7    ABG  Recent Labs Lab 07/10/15 1422  PHART 7.332*  PCO2ART 42.8  PO2ART 55.3*    Liver Enzymes  Recent Labs Lab 07/09/15 2354  AST 31  ALT 47   ALKPHOS 105  BILITOT 1.6*  ALBUMIN 4.1    Cardiac Enzymes No results for input(s): TROPONINI, PROBNP in the last 168 hours.  Glucose  Recent Labs Lab 07/10/15 0940 07/10/15 1239  GLUCAP 208* 275*    Imaging Ct Abdomen Pelvis W Contrast  07/10/2015  CLINICAL DATA:  54 year old male with right lower quadrant abdominal pain EXAM: CT ABDOMEN AND PELVIS WITH CONTRAST TECHNIQUE: Multidetector CT imaging of the abdomen and pelvis was performed using the standard protocol following bolus administration of intravenous contrast. CONTRAST:  180m OMNIPAQUE IOHEXOL 300 MG/ML  SOLN COMPARISON:  CT dated 04/29/2014 FINDINGS: The visualized lung bases are clear. There is coronary vascular calcification. No intra-abdominal free air or free fluid. Apparent diffuse hepatic steatosis. The gallbladder, pancreas appear unremarkable. Stable Splenomegaly measuring 17 cm in greatest length. The adrenal glands, kidneys, visualized ureters appear unremarkable. The urinary bladder is only partially distended. There is apparent diffuse thickening of the bladder wall which may be partly related to underdistention. Cystitis  is not excluded. Correlation with urinalysis recommended. Caps there is mild enlargement of the prostate gland measuring 5.2 cm in transverse axial dimension. There is sigmoid diverticulosis without active inflammation. There is no evidence of bowel obstruction. The appendix is enlarged and inflamed. He is located in the right lower quadrant inferior and medial to the cecum. No drainable fluid collection/abscess identified. 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 An aortobifemoral bypass graft is noted and appears patent. Caps there is atherosclerotic calcifications of the origins of the celiac axis, SMA. This vessels however remain patent. No portal venous gas identified. The splenic vein is patent. There is no  adenopathy. Midline vertical anterior pelvic wall incisional scar. Degenerative changes of the spine. No acute fracture. IMPRESSION: Acute appendicitis.  No abscess. Electronically Signed   By: Anner Crete M.D.   On: 07/10/2015 02:52   Dg Chest Port 1 View  07/10/2015  CLINICAL DATA:  Status post intubation EXAM: PORTABLE CHEST - 1 VIEW COMPARISON:  07/10/2015 FINDINGS: Cardiac shadow is stable. Pulmonary edema is again identified. An endotracheal tube is been placed at 2.3 cm above the carina. No focal confluent infiltrate is seen. The overall inspiratory effort is poor. IMPRESSION: Stable pulmonary edema. Poor inspiratory effort. Endotracheal tube in satisfactory position. Electronically Signed   By: Inez Catalina M.D.   On: 07/10/2015 15:27   Dg Chest Port 1 View  07/10/2015  CLINICAL DATA:  Pulmonary edema.  Acute appendicitis. EXAM: PORTABLE CHEST 1 VIEW COMPARISON:  04/08/2015 FINDINGS: The patient has developed diffuse interstitial accentuation consistent with pulmonary edema. Heart size and pulmonary vascularity are normal. No acute osseous abnormality. IMPRESSION: Diffuse slight interstitial pulmonary edema. Electronically Signed   By: Lorriane Shire M.D.   On: 07/10/2015 13:11    ASSESSMENT / PLAN:  PULMONARY A: Acute Hypoxic Respiratory Failure - Secondary to Pulmonary Edema & Possible Aspiration on CXR H/O COPD H/O OSA  P:   Full Ventilator Support SBT and Sedation Vacation in AM  INFECTIOUS A:   Sepsis  Acute Appendicitis - S/P Lap Appy 12/28  P:   Blood cultures pending Day #1 Rocephin & Azithromycin Checking Procalcitonin per algorithm Tracheal Aspirate Culture  CARDIOVASCULAR A:  H/O Diastolic CHF based on [facility] TTE 2015 H/O CAD s/p MI - S/P Stent July 2015 per records H/O PAD - S/P Aorto-Bifemoral Bypass H/O HTN  P:  Monitor in telemetry Checking TTE Strick I/O Intermittent Lasix IV for diuresis  GASTROINTESTINAL A:   Acute Appendicitis - S/P Lap  Appy 12/28 by Rolm Bookbinder Liver Cirrhosis - NASH H/O Splenomegaly  P:   Management per Surgery Service NPO Protonix IV daily  RENAL A:   No acute issues.  P:   Trending UOP with foley Monitoring renal function daily with BUN/Creatinine Repeat Electrolyte panel tomorrow morning  HEMATOLOGIC A:   Leukocytosis - Likely due to sepsis from acute appendicitis.  P:  Trending cell counts daily with CBC SCDs  ENDOCRINE A:   DM Type 2 - BG uncontrolled.    P:   Accu-Checks q4hr while NPO Low dose SSI per algorithm Checking Hgb A1c  NEUROLOGIC A:   Sedation on Ventilator H/O Diabetic Neuropathy H/O Depression  P:   RASS goal: 0 to -1 Propofol gtt Fentanyl IV prn   FAMILY  - Updates:  No family at bedside. RN reports Dr. Donne Hazel already updated patient's family.  - Inter-disciplinary family meet or Palliative Care meeting due  by:  07/17/15   DISCUSSION:  54 year old male with prior history of multiple medical problems including COPD, OSA, CAD, PAD, & CHF. Underwent laparoscopic appendectomy today by general surgery. Patient was extubated in the operating room with hypoxia upon extubation and ultimately required reintubation in the postanesthesia care unit. Continuing broad-spectrum antibiotics with Flagyl & Rocephin. Checking hemoglobin A1c. Monitoring volume status status post Lasix 1. May require additional doses of Lasix.  I have spent a total of 43 minutes of critical care time today caring for the patient and reviewing the patient's electronic medical record.  Sonia Baller Ashok Cordia, M.D. [facility] Pulmonary & Critical Care Pager:  [phone] After 3pm or if no response, call [phone] 07/10/2015, 3:53 PM

## 2015-07-10 NOTE — Op Note (Signed)
Preoperative diagnosis: acute appendicitis Postoperative diagnosis: same as above Procedure: laparoscopic appendectomy Surgeon: Dr Serita Grammes Anesthesia: general EBL: minimal Drains none Specimen appendix to pathology Complications: none Sponge count correct at completion Disposition to recovery stable  Indications: This is a 4 yom with rlq pain and ct with appendicitis. We discussed laparoscopic appendectomy.  Procedure: After informed consent was obtained the patient was taken to the operating room. He was already given antibiotics. Sequential compression devices were on his legs. He was placed under general anesthesia without complication. His abdomen was prepped and draped in the standard sterile surgical fashion. A surgical timeout was then performed. A foley catheter was placed.   I infiltrated marcaine below the umbilicus. I made an incision and then entered the fascia sharply. I then entered the peritoneum bluntly. There were some omental adhesions from prior surgery that were taken down bluntly.  I placed a 0 vicryl pursestring suture and inserted a hasson trocar.I then inserted 2 further 5 mm trocars in the suprapubic region and the right upper abdomen abdomen. His appendix was gangrenous and there was a lot of inflammation present. I rolled the right colon up and was able to free the appendix from the sidewall.  There was no abscess and it did not appear perforated I saw the TI into the right colon. I then dissected it free from the cecum. I divided the mesoappendix with the harmonic scalpel. I then divided the appendix with the gia stapler including a small cuff of cecum as teh base was very inflamed.  I then placed this in a bag and removed it from the abdomen.  I then obtained hemostasis and irrigated. I inspected the area near the umbilical incision and there was no evidence of entry injury. I then removed the umbilical trocar and closed with 0 vicryl and the endoclose  device after tying down the pursestring.  I then desufflated the abdomen and removed all my remaining trocars. I then closed these with 4-0 Monocryl and Dermabond. He tolerated this well was extubated and transferred to the recovery room in stable condition

## 2015-07-10 NOTE — Progress Notes (Signed)
Pt placed on 18cmH20 per patient statement of home cpap settings. Pt wearing full face mask as worn at home. 15lpm 02 titrated into mask. Vitals as noted RN is aware. X-ray is being done at this time.

## 2015-07-10 NOTE — ED Notes (Signed)
Patient transported to CT 

## 2015-07-10 NOTE — Anesthesia Preprocedure Evaluation (Signed)
Anesthesia Evaluation  Patient identified by MRN, date of birth, ID band Patient awake    Reviewed: Allergy & Precautions, NPO status , Patient's Chart, lab work & pertinent test results  Airway Mallampati: II  TM Distance: >3 FB Neck ROM: Full    Dental  (+) Dental Advisory Given   Pulmonary sleep apnea , COPD, former smoker,    breath sounds clear to auscultation       Cardiovascular + Past MI, + Cardiac Stents, + Peripheral Vascular Disease and +CHF   Rhythm:Regular Rate:Normal  01/2014. EF 55%   Neuro/Psych negative neurological ROS     GI/Hepatic negative GI ROS, (+) Cirrhosis  (NASH- LFT's nl)      ,   Endo/Other  diabetes, Type 2, Insulin Dependent  Renal/GU Renal InsufficiencyRenal disease     Musculoskeletal   Abdominal   Peds  Hematology negative hematology ROS (+)   Anesthesia Other Findings   Reproductive/Obstetrics                                 Lab Results  Component Value Date   WBC 12.1* 07/09/2015   HGB 16.1 07/09/2015   HCT 45.0 07/09/2015   MCV 91.5 07/09/2015   PLT 167 07/09/2015   Lab Results  Component Value Date   CREATININE 1.19 07/09/2015   BUN 19 07/09/2015   NA 135 07/09/2015   K 4.2 07/09/2015   CL 98* 07/09/2015   CO2 23 07/09/2015   Lab Results  Component Value Date   INR 1.18 07/10/2015   INR 1.0 11/06/2014   INR 1.0 05/11/2013    Anesthesia Physical Anesthesia Plan  ASA: III  Anesthesia Plan: General   Post-op Pain Management:    Induction: Intravenous  Airway Management Planned: Oral ETT  Additional Equipment:   Intra-op Plan:   Post-operative Plan: Extubation in OR  Informed Consent: I have reviewed the patients History and Physical, chart, labs and discussed the procedure including the risks, benefits and alternatives for the proposed anesthesia with the patient or authorized representative who has indicated his/her  understanding and acceptance.   Dental advisory given  Plan Discussed with: CRNA  Anesthesia Plan Comments:         Anesthesia Quick Evaluation

## 2015-07-10 NOTE — ED Provider Notes (Signed)
CSN: [id number]     Arrival date & time 07/09/15  2316 History   By signing my name below, I, Larry Terrell, attest that this documentation has been prepared under the direction and in the presence of Harvel Quale, MD.  Electronically Signed: Forrestine Terrell, ED Scribe. 07/10/2015. 2:17 AM.   Chief Complaint  Patient presents with  . Abdominal Pain   The history is provided by the patient. No language interpreter was used.    HPI Comments: Larry Terrell is a 54 y.o. male with a PMHx of DM and CHF who presents to the Emergency Department complaining of constant, ongoing R sided abdominal pain x 4 days. Discomfort is exacerbated with deep palpation and with movement. No alleviating factors at this time. Pt also reports mild nausea. No OTC medications or home remedies attempted prior to arrival. No recent fever, vomiting, or diarrhea. No recent long distance travel. Pt denies any recent antibiotic use.  PCP: No primary care provider on file.    Past Medical History  Diagnosis Date  . Heart attack ([place])   . CHF (congestive heart failure) ([place])   . Neuropathy ([place])   . COPD (chronic obstructive pulmonary disease) ([place])   . Diabetes mellitus with neuropathy ([place])   . 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 Depression   . OSA (obstructive sleep apnea)   . Peripheral vascular disease due to secondary diabetes mellitus ([place])   . Cirrhosis [facility])    Past Surgical History  Procedure Laterality Date  . Cardiac stents    . Cardiac catheterization     No family history on file. Social History  Substance Use Topics  . Smoking status: Former Research scientist (life sciences)  . Smokeless tobacco: None  . Alcohol Use: No    Review of Systems  Constitutional: Positive for appetite change. Negative for fever, chills and fatigue.  HENT: Negative for congestion, rhinorrhea and sinus pressure.   Eyes: Negative for pain and redness.  Respiratory: Negative for cough, chest tightness and shortness of breath.    Cardiovascular: Negative for chest pain and palpitations.  Gastrointestinal: Positive for abdominal pain. Negative for nausea, vomiting, diarrhea and constipation.  Genitourinary: Negative for dysuria, urgency and hematuria.  Musculoskeletal: Negative for myalgias and back pain.  Skin: Negative for rash.  Neurological: Negative for dizziness, weakness, [place]-headedness and headaches.  Hematological: Does not bruise/bleed easily.    A complete 10 system review of systems was obtained and all systems are negative except as noted in the HPI and PMH.    Allergies  Ace inhibitors  Home Medications   Prior to Admission medications   Medication Sig Start Date End Date Taking? Authorizing Provider  atorvastatin (LIPITOR) 20 MG tablet Take 20 mg by mouth daily.   Yes Historical Provider, MD  citalopram (CELEXA) 40 MG tablet Take 40 mg by mouth daily.   Yes Historical Provider, MD  furosemide (LASIX) 20 MG tablet Take 20 mg by mouth daily.   Yes Historical Provider, MD  gabapentin (NEURONTIN) 300 MG capsule Take 900 mg by mouth 3 (three) times daily.   Yes Historical Provider, MD  hydrochlorothiazide (HYDRODIURIL) 25 MG tablet Take 25 mg by mouth daily as needed (only when taking tramadol).   Yes Historical Provider, MD  insulin NPH-regular Human (NOVOLIN 70/30) (70-30) 100 UNIT/ML injection Inject 20-24 Units into the skin 2 (two) times daily. Take 24units in the morning and 20units in the evening.   Yes Historical Provider, MD  metoprolol (LOPRESSOR) 50 MG tablet  Take 50 mg by mouth 2 (two) times daily.   Yes Historical Provider, MD  nitroGLYCERIN (NITROSTAT) 0.4 MG SL tablet Place 0.4 mg under the tongue every 5 (five) minutes as needed for chest pain.   Yes Historical Provider, MD  prednisoLONE acetate (PRED FORTE) 1 % ophthalmic suspension Place 1 drop into the left eye 4 (four) times daily.   Yes Historical Provider, MD  traMADol (ULTRAM) 50 MG tablet Take 1 tablet (50 mg total) by mouth  every 6 (six) hours as needed. Patient taking differently: Take 50 mg by mouth every 6 (six) hours as needed for moderate pain.  05/14/15  Yes Loney Hering, MD   Triage Vitals: BP 137/81 mmHg  Pulse 89  Temp(Src) 97.7 F (36.5 C) (Oral)  Ht 5' 9"  (1.753 m)  Wt 216 lb (97.977 kg)  BMI 31.88 kg/m2  SpO2 87%   Physical Exam  Constitutional: He is oriented to person, place, and time. He appears well-developed and well-nourished. No distress.  HENT:  Head: Normocephalic and atraumatic.  Right Ear: External ear normal.  Left Ear: External ear normal.  Mouth/Throat: Oropharynx is clear and moist. No oropharyngeal exudate.  Eyes: EOM are normal. Pupils are equal, round, and reactive to [place].  Neck: Normal range of motion. Neck supple.  Cardiovascular: Normal rate, regular rhythm, normal heart sounds and intact distal pulses.   No murmur heard. Pulmonary/Chest: Effort normal. No respiratory distress. He has no wheezes. He has no rales.  Abdominal: Soft. He exhibits no distension. There is tenderness (most significant in the right lower quadrant, pain exacerbated by movement of his leg).  Musculoskeletal: He exhibits no edema.  Neurological: He is alert and oriented to person, place, and time.  Skin: Skin is warm and dry. No rash noted. He is not diaphoretic.  Vitals reviewed.   ED Course  Procedures (including critical care time)  DIAGNOSTIC STUDIES: Oxygen Saturation is 96% on RA, adequate by my interpretation.    COORDINATION OF CARE: 2:12 AM- Will give fluids and Dilaudid. Will order CT abdomen pelvis with contrast, Lipase, CMP, CBC, and urinalysis. Discussed treatment plan with pt at bedside and pt agreed to plan.     Labs Review Labs Reviewed  LIPASE, BLOOD - Abnormal; Notable for the following:    Lipase 55 (*)    All other components within normal limits  COMPREHENSIVE METABOLIC PANEL - Abnormal; Notable for the following:    Chloride 98 (*)    Glucose, Bld 311 (*)     Total Bilirubin 1.6 (*)    All other components within normal limits  CBC - Abnormal; Notable for the following:    WBC 12.1 (*)    All other components within normal limits  URINALYSIS, ROUTINE W REFLEX MICROSCOPIC (NOT AT [facility]) - Abnormal; Notable for the following:    Specific Gravity, Urine 1.033 (*)    Glucose, UA >1000 (*)    Hgb urine dipstick TRACE (*)    Ketones, ur 15 (*)    Protein, ur >300 (*)    All other components within normal limits  URINE MICROSCOPIC-ADD ON - Abnormal; Notable for the following:    Squamous Epithelial / LPF 0-5 (*)    Bacteria, UA RARE (*)    All other components within normal limits  I-STAT CG4 LACTIC ACID, ED - Abnormal; Notable for the following:    Lactic Acid, Venous 2.18 (*)    All other components within normal limits  I-STAT CG4 LACTIC ACID, ED -  Abnormal; Notable for the following:    Lactic Acid, Venous 2.16 (*)    All other components within normal limits  CULTURE, BLOOD (ROUTINE X 2)  CULTURE, BLOOD (ROUTINE X 2)  LACTIC ACID, PLASMA  LACTIC ACID, PLASMA    Imaging Review Ct Abdomen Pelvis W Contrast  07/10/2015  CLINICAL DATA:  54 year old male with right lower quadrant abdominal pain EXAM: CT ABDOMEN AND PELVIS WITH CONTRAST TECHNIQUE: Multidetector CT imaging of the abdomen and pelvis was performed using the standard protocol following bolus administration of intravenous contrast. CONTRAST:  147m OMNIPAQUE IOHEXOL 300 MG/ML  SOLN COMPARISON:  CT dated 04/29/2014 FINDINGS: The visualized lung bases are clear. There is coronary vascular calcification. No intra-abdominal free air or free fluid. Apparent diffuse hepatic steatosis. The gallbladder, pancreas appear unremarkable. Stable Splenomegaly measuring 17 cm in greatest length. The adrenal glands, kidneys, visualized ureters appear unremarkable. The urinary bladder is only partially distended. There is apparent diffuse thickening of the bladder wall which may be partly related to  underdistention. Cystitis is not excluded. Correlation with urinalysis recommended. Caps there is mild enlargement of the prostate gland measuring 5.2 cm in transverse axial dimension. There is sigmoid diverticulosis without active inflammation. There is no evidence of bowel obstruction. The appendix is enlarged and inflamed. He is located in the right lower quadrant inferior and medial to the cecum. No drainable fluid collection/abscess identified. 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 An aortobifemoral bypass graft is noted and appears patent. Caps there is atherosclerotic calcifications of the origins of the celiac axis, SMA. This vessels however remain patent. No portal venous gas identified. The splenic vein is patent. There is no adenopathy. Midline vertical anterior pelvic wall incisional scar. Degenerative changes of the spine. No acute fracture. IMPRESSION: Acute appendicitis.  No abscess. Electronically Signed   By: AAnner CreteM.D.   On: 07/10/2015 02:52   I have personally reviewed and evaluated these images and lab results as part of my medical decision-making.   EKG Interpretation None      MDM  Patient was seen and evaluated in stable condition. Patient afebrile. Mild leukocytosis. CT consistent with acute appendicitis. Patient was given IV fluids and pain medicine. Case was discussed with Dr. BBarry Dieneson call for general surgery who said that they would come and evaluate and take care of the patient. Patient was admitted under the care of general surgery for definitive treatment. Final diagnoses:  Acute appendicitis, unspecified acute appendicitis type    1. Acute appendicitis  I personally performed the services described in this documentation, which was scribed in my presence. The recorded information has been reviewed and is accurate.  EHarvel Quale MD 07/10/15 [phone]

## 2015-07-10 NOTE — ED Notes (Signed)
Phlebotomy at the bedside  

## 2015-07-10 NOTE — Anesthesia Procedure Notes (Addendum)
Procedure Name: Intubation Date/Time: 07/10/2015 11:14 AM Performed by: Julian Reil Pre-anesthesia Checklist: Patient identified, Emergency Drugs available, Suction available and Patient being monitored Patient Re-evaluated:Patient Re-evaluated prior to inductionOxygen Delivery Method: Circle system utilized Preoxygenation: Pre-oxygenation with 100% oxygen Intubation Type: IV induction Ventilation: Mask ventilation without difficulty and Oral airway inserted - appropriate to patient size Laryngoscope Size: Mac and 4 Grade View: Grade I Tube type: Oral Tube size: 7.5 mm Number of attempts: 1 Airway Equipment and Method: Stylet Placement Confirmation: ETT inserted through vocal cords under direct vision,  positive ETCO2 and breath sounds checked- equal and bilateral Secured at: 22 cm Tube secured with: Tape Dental Injury: Teeth and Oropharynx as per pre-operative assessment

## 2015-07-10 NOTE — ED Notes (Signed)
Blood cultures drawn before ABX started.

## 2015-07-10 NOTE — Interval H&P Note (Signed)
History and Physical Interval Note:  07/10/2015 9:37 AM  Larry Terrell  has presented today for surgery, with the diagnosis of Appedicitis  The various methods of treatment have been discussed with the patient and family. After consideration of risks, benefits and other options for treatment, the patient has consented to  Procedure(s): APPENDECTOMY LAPAROSCOPIC (N/A) APPENDECTOMY (N/A) as a surgical intervention .  The patient's history has been reviewed, patient examined, no change in status, stable for surgery.  I have reviewed the patient's chart and labs.  Questions were answered to the patient's satisfaction.    Discussed lap appy possibly open given prior surgery. Risks discussed.  Needs ekg  Lynita Groseclose

## 2015-07-10 NOTE — Progress Notes (Signed)
Patient ID: Larry Terrell, male   DOB: 1961/01/08, 54 y.o.   MRN: [id number] Alert and responsive, cxr with pulm edema, has received several doses lasix with good response, not maintaining sats on cpap right now, po2 55 on abg, may very well need intubation and will contact ccm for assistance in icu care

## 2015-07-10 NOTE — Progress Notes (Signed)
Pt. Was transported to 2S01 without any complications.

## 2015-07-10 NOTE — Anesthesia Postprocedure Evaluation (Addendum)
Anesthesia Post Note  Patient: Larry Terrell  Procedure(s) Performed: Procedure(s) (LRB): APPENDECTOMY LAPAROSCOPIC (N/A)  Patient location during evaluation: PACU Anesthesia Type: General Level of consciousness: awake and alert and patient cooperative Pain management: pain level controlled Vital Signs Assessment: vitals unstable Respiratory status: patient re-intubated and respiratory function unstable Cardiovascular status: stable Postop Assessment: no signs of nausea or vomiting Anesthetic complications: yes Anesthetic complication details: required intubationComments: See chart, pulmonary edema with hypoxia despite intervention, reintubation in controlled setting and transfer to the ICU    Last Vitals:  Filed Vitals:   07/10/15 1430 07/10/15 1503  BP: 132/84 161/86  Pulse:  82  Temp:    Resp:  18    Last Pain:  Filed Vitals:   07/10/15 1505  PainSc: 6                  Reiner Loewen

## 2015-07-10 NOTE — Transfer of Care (Signed)
Immediate Anesthesia Transfer of Care Note  Patient: Larry Terrell  Procedure(s) Performed: Procedure(s): APPENDECTOMY LAPAROSCOPIC (N/A)  Patient Location: PACU  Anesthesia Type:General  Level of Consciousness: awake, alert , oriented and patient cooperative  Airway & Oxygen Therapy: Patient Spontanous Breathing and Patient connected to face mask oxygen  Post-op Assessment: Report given to RN, Post -op Vital signs reviewed and stable and Patient moving all extremities  Post vital signs: Reviewed and stable  Last Vitals:  Filed Vitals:   07/10/15 0830 07/10/15 0900  BP: 156/71 137/81  Pulse: 96 89  Temp:      Complications: No apparent anesthesia complications

## 2015-07-10 NOTE — H&P (Signed)
Larry Terrell is an 54 y.o. male.   Chief Complaint: abdominal pain HPI:  Patient is a 54 year old male who started having abdominal pain approximately 3 days prior to presentation to the hospital. He states that it continued to worsen over time. He has had nausea but no vomiting. He denies any dysuria or change in bowel habits. He has not had fevers or chills.  He is a diabetic with a history of peripheral vascular disease and a history of an MI.  He denies any chest pain or shortness of breath. He has not had any difficulty breathing. He has tramadol and tried tramadol for his abdominal pain. This helped very little bit, but not significantly. He states that the pain was much worse when he would move. Once the pain became unbearable, he came to the emergency department.  Past Medical History  Diagnosis Date  . Diabetes mellitus without complication ([place])   . Heart attack ([place])   . CHF (congestive heart failure) ([place])   . Neuropathy ([place])   . COPD (chronic obstructive pulmonary disease) ([facility])   Peripheral vascular disease Temporal arteritis Depression CAD History of perianal abscess OSA Cirrhosis Carpal tunnel syndrome   Past Surgical History  Procedure Laterality Date  . Cardiac stents    . Cardiac catheterization    Open abdominal surgery for "bypass surgery to legs"  (sounds like ischemic leg), at least had fem pop Corneal transplant Carpal tunnel release.  FH HTN mother CAD and dementia - father  Social History:  reports that he has quit smoking. He does not have any smokeless tobacco history on file. He reports that he does not drink alcohol or use illicit drugs.  Allergies:  Allergies  Allergen Reactions  . Ace Inhibitors Other (See Comments)    Pt reports ace inhibitors make him cough    Medications Medications Outpatient Medications (10) Hospital Medications (17) Clinic-Administered Medications (0)   New medications from outside sources are available for  reconciliation   atorvastatin (LIPITOR) 20 MG tablet    citalopram (CELEXA) 40 MG tablet    furosemide (LASIX) 20 MG tablet    gabapentin (NEURONTIN) 300 MG capsule    hydrochlorothiazide (HYDRODIURIL) 25 MG tablet    insulin NPH-regular Human (NOVOLIN 70/30) (70-30) 100 UNIT/ML injection    metoprolol (LOPRESSOR) 50 MG tablet    nitroGLYCERIN (NITROSTAT) 0.4 MG SL tablet    prednisoLONE acetate (PRED FORTE) 1 % ophthalmic suspension    traMADol (ULTRAM) 50 MG tablet          Results for orders placed or performed during the hospital encounter of 07/10/15 (from the past 48 hour(s))  Urinalysis, Routine w reflex microscopic (not at [facility])     Status: Abnormal   Collection Time: 07/09/15 11:40 PM  Result Value Ref Range   Color, Urine YELLOW YELLOW   APPearance CLEAR CLEAR   Specific Gravity, Urine 1.033 (H) 1.005 - 1.030   pH 5.0 5.0 - 8.0   Glucose, UA >1000 (A) NEGATIVE mg/dL   Hgb urine dipstick TRACE (A) NEGATIVE   Bilirubin Urine NEGATIVE NEGATIVE   Ketones, ur 15 (A) NEGATIVE mg/dL   Protein, ur >300 (A) NEGATIVE mg/dL   Nitrite NEGATIVE NEGATIVE   Leukocytes, UA NEGATIVE NEGATIVE  Urine microscopic-add on     Status: Abnormal   Collection Time: 07/09/15 11:40 PM  Result Value Ref Range   Squamous Epithelial / LPF 0-5 (A) NONE SEEN   WBC, UA NONE SEEN 0 - 5 WBC/hpf  RBC / HPF NONE SEEN 0 - 5 RBC/hpf   Bacteria, UA RARE (A) NONE SEEN  Lipase, blood     Status: Abnormal   Collection Time: 07/09/15 11:54 PM  Result Value Ref Range   Lipase 55 (H) 11 - 51 U/L  Comprehensive metabolic panel     Status: Abnormal   Collection Time: 07/09/15 11:54 PM  Result Value Ref Range   Sodium 135 135 - 145 mmol/L   Potassium 4.2 3.5 - 5.1 mmol/L   Chloride 98 (L) 101 - 111 mmol/L   CO2 23 22 - 32 mmol/L   Glucose, Bld 311 (H) 65 - 99 mg/dL   BUN 19 6 - 20 mg/dL   Creatinine, Ser 1.19 0.61 - 1.24 mg/dL   Calcium 10.0 8.9 - 10.3 mg/dL   Total Protein 7.6 6.5 - 8.1 g/dL    Albumin 4.1 3.5 - 5.0 g/dL   AST 31 15 - 41 U/L   ALT 47 17 - 63 U/L   Alkaline Phosphatase 105 38 - 126 U/L   Total Bilirubin 1.6 (H) 0.3 - 1.2 mg/dL   GFR calc non Af Amer >60 >60 mL/min   GFR calc Af Amer >60 >60 mL/min    Comment: (NOTE) The eGFR has been calculated using the CKD EPI equation. This calculation has not been validated in all clinical situations. eGFR's persistently <60 mL/min signify possible Chronic Kidney Disease.    Anion gap 14 5 - 15  CBC     Status: Abnormal   Collection Time: 07/09/15 11:54 PM  Result Value Ref Range   WBC 12.1 (H) 4.0 - 10.5 K/uL   RBC 4.92 4.22 - 5.81 MIL/uL   Hemoglobin 16.1 13.0 - 17.0 g/dL   HCT 45.0 39.0 - 52.0 %   MCV 91.5 78.0 - 100.0 fL   MCH 32.7 26.0 - 34.0 pg   MCHC 35.8 30.0 - 36.0 g/dL   RDW 14.9 11.5 - 15.5 %   Platelets 167 150 - 400 K/uL  I-Stat CG4 Lactic Acid, ED     Status: Abnormal   Collection Time: 07/10/15  2:53 AM  Result Value Ref Range   Lactic Acid, Venous 2.18 (HH) 0.5 - 2.0 mmol/L   Comment NOTIFIED PHYSICIAN   I-Stat CG4 Lactic Acid, ED     Status: Abnormal   Collection Time: 07/10/15  5:27 AM  Result Value Ref Range   Lactic Acid, Venous 2.16 (HH) 0.5 - 2.0 mmol/L   Comment NOTIFIED PHYSICIAN    Ct Abdomen Pelvis W Contrast  07/10/2015  CLINICAL DATA:  54 year old male with right lower quadrant abdominal pain EXAM: CT ABDOMEN AND PELVIS WITH CONTRAST TECHNIQUE: Multidetector CT imaging of the abdomen and pelvis was performed using the standard protocol following bolus administration of intravenous contrast. CONTRAST:  170m OMNIPAQUE IOHEXOL 300 MG/ML  SOLN COMPARISON:  CT dated 04/29/2014 FINDINGS: The visualized lung bases are clear. There is coronary vascular calcification. No intra-abdominal free air or free fluid. Apparent diffuse hepatic steatosis. The gallbladder, pancreas appear unremarkable. Stable Splenomegaly measuring 17 cm in greatest length. The adrenal glands, kidneys, visualized ureters  appear unremarkable. The urinary bladder is only partially distended. There is apparent diffuse thickening of the bladder wall which may be partly related to underdistention. Cystitis is not excluded. Correlation with urinalysis recommended. Caps there is mild enlargement of the prostate gland measuring 5.2 cm in transverse axial dimension. There is sigmoid diverticulosis without active inflammation. There is no evidence of bowel obstruction. The appendix  is enlarged and inflamed. He is located in the right lower quadrant inferior and medial to the cecum. No drainable fluid collection/abscess identified. 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 An aortobifemoral bypass graft is noted and appears patent. Caps there is atherosclerotic calcifications of the origins of the celiac axis, SMA. This vessels however remain patent. No portal venous gas identified. The splenic vein is patent. There is no adenopathy. Midline vertical anterior pelvic wall incisional scar. Degenerative changes of the spine. No acute fracture. IMPRESSION: Acute appendicitis.  No abscess. Electronically Signed   By: Anner Crete M.D.   On: 07/10/2015 02:52    Review of Systems  All other systems reviewed and are negative.   Blood pressure 157/89, pulse 52, temperature 97.7 F (36.5 C), temperature source Oral, height _0  (1.753 m), weight 97.977 kg (216 lb), SpO2 95 %. Physical Exam  Constitutional: He is oriented to person, place, and time. He appears well-developed and well-nourished. No distress.  HENT:  Head: Normocephalic and atraumatic.  Right Ear: External ear normal.  Left Ear: External ear normal.  Eyes: Conjunctivae are normal. Pupils are equal, round, and reactive to [place]. Right eye exhibits no discharge. Left eye exhibits no discharge. No scleral icterus.  Neck: Neck supple. No tracheal deviation present. No thyromegaly present.    Cardiovascular: Normal rate, regular rhythm and intact distal pulses.   Respiratory: Effort normal. No respiratory distress. He exhibits no tenderness.  GI: Soft. He exhibits distension (mildly). He exhibits no mass. There is tenderness (RLQ). There is guarding (voluntary guarding). There is no rebound.  Neurological: He is alert and oriented to person, place, and time. Coordination normal.  Skin: Skin is warm and dry. No rash noted. He is not diaphoretic. No erythema. No pallor.  Psychiatric: He has a normal mood and affect. His behavior is normal. Judgment and thought content normal.     Assessment/Plan Acute appendicitis NPO IV Fluid IV antibiotics  To OR for laparoscopic appendectomy Appendectomy was described to the patient.  The incisions and surgical technique were explained.  The patient was advised that some of the hair on the abdomen would be clipped, and that a foley catheter would be placed.  I advised the patient of the risks of surgery including, but not limited to, bleeding, infection, damage to other structures, risk of an open operation, risk of abscess, and risk of blood clot.  The recovery was also described to the patient.  He was advised that he will have lifting restrictions for 2 weeks.    Uncontrolled diabetes with complications of neuropathy, PVD, CAD.  - Add sliding scale insulin.  Will need medicine consult.  Glucose will need to be lower for surgery.  CAD - Get EKG.  Given lack of symptoms post stent, will likely not need cards clearance.    Teana Lindahl 07/10/2015, 8:26 AM

## 2015-07-10 NOTE — OR Nursing (Signed)
Pt arrived to pacu from OR at 1234.  Tachypneic, on simple face mask, sats 83% with mild respiratory distress.  Pt denies SOB.  When asked if he was getting his breath okay he nodded yes.  Color is pink, HR 80s, bp 140s/80s.  RR in the low 20s.  Minimal accessory muscle use.  Lasix 57m IV given prior to leaving OR.  Pt. With diffuse rales and diminshed BS bilaterally.  Pt. Denies pain.  Pt. Is alert enough to answer questions and is oriented to person, place and situation.  Order received to initiate CPAP per resp therapy.  Cpap initiated at 1255 at 18 cm H20.  After CPAP initiated, Dr. MErmalene Postinnotified of CXR, and current patient condition.  Dr. MErmalene Postinand Dr. GLissa Hoardat bedside.  Lasix 20 mg IV ordered and given.

## 2015-07-10 NOTE — Consult Note (Signed)
[facility] Medical Consultation  Calder Oblinger Rodriquez UUV:[id number] DOB: 09-15-1960 DOA: 07/10/2015 PCP: No primary care provider on file.   Requesting physician: Dr Barry Dienes - Surgery Date of consultation: 07/10/15 Reason for consultation: DM/Glucose mgt  Impression/Recommendations Active Problems:   Acute appendicitis   Appendicitis: Pt being treated by general surgery adn scheduled for surgery at 11:30. Lactic acid down trending and now nml at 1.54 - per primary team  Diabetes: compliant w/ 70/30 regimen. Home glucose avg 200. No insulin this am. Glucose downtrending after IVF. No insulin at this time as pt is scheduled for surgery in less than 2 hrs and glucose at 200.  - A1c - SSI - resume 70/30 when taking PO  Peripheral neuropathy: at baseline - continue neurontin once taking PO postop.   CHF: review of [facility] records show pt w/ diastolic dysfunction w/ EF 55-60%. No evidence of acute decompensation - resume Lasix in am    I will followup again this evening. Please contact me if I can be of assistance in the meanwhile or with any other chronic medical conditions. Thank you for this consultation.   Chief Complaint: Abd pain  HPI:  Abd pain. Ongoing for 3 days. RLQ. Getting worse. Nausea w/o vomiting. Little to no PO for past day. Tramadol w/ minimal improvement. Worse w/ certain movements.   Review of Systems:  Deneis SOB, CP, change in bowel habits, fevers, chills, palpitations, LOC, HA, neck stiffness, dysuria, back/flank pain.   Per HPI with all other pertinent systems negative.    Past Medical History  Diagnosis Date  . Heart attack ([place])   . CHF (congestive heart failure) ([place])   . Neuropathy ([place])   . COPD (chronic obstructive pulmonary disease) ([place])   . Diabetes mellitus with neuropathy ([place])   . 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 Depression   . OSA (obstructive sleep apnea)   . Peripheral vascular disease due to secondary diabetes mellitus ([place])    . Cirrhosis [facility]    Past Surgical History  Procedure Laterality Date  . Cardiac stents    . Cardiac catheterization     Social History:  reports that he has quit smoking. He does not have any smokeless tobacco history on file. He reports that he does not drink alcohol or use illicit drugs.  Allergies  Allergen Reactions  . Ace Inhibitors Other (See Comments)    Pt reports ace inhibitors make him cough   No family history on file.  Prior to Admission medications   Medication Sig Start Date End Date Taking? Authorizing Provider  atorvastatin (LIPITOR) 20 MG tablet Take 20 mg by mouth daily.   Yes Historical Provider, MD  citalopram (CELEXA) 40 MG tablet Take 40 mg by mouth daily.   Yes Historical Provider, MD  furosemide (LASIX) 20 MG tablet Take 20 mg by mouth daily.   Yes Historical Provider, MD  gabapentin (NEURONTIN) 300 MG capsule Take 900 mg by mouth 3 (three) times daily.   Yes Historical Provider, MD  hydrochlorothiazide (HYDRODIURIL) 25 MG tablet Take 25 mg by mouth daily as needed (only when taking tramadol).   Yes Historical Provider, MD  insulin NPH-regular Human (NOVOLIN 70/30) (70-30) 100 UNIT/ML injection Inject 20-24 Units into the skin 2 (two) times daily. Take 24units in the morning and 20units in the evening.   Yes Historical Provider, MD  metoprolol (LOPRESSOR) 50 MG tablet Take 50 mg by mouth 2 (two) times daily.   Yes Historical Provider, MD  nitroGLYCERIN (  NITROSTAT) 0.4 MG SL tablet Place 0.4 mg under the tongue every 5 (five) minutes as needed for chest pain.   Yes Historical Provider, MD  prednisoLONE acetate (PRED FORTE) 1 % ophthalmic suspension Place 1 drop into the left eye 4 (four) times daily.   Yes Historical Provider, MD  traMADol (ULTRAM) 50 MG tablet Take 1 tablet (50 mg total) by mouth every 6 (six) hours as needed. Patient taking differently: Take 50 mg by mouth every 6 (six) hours as needed for moderate pain.  05/14/15  Yes Loney Hering, MD    Physical Exam: Blood pressure 137/81, pulse 89, temperature 97.7 F (36.5 C), temperature source Oral, height 5' 9"  (1.753 m), weight 97.977 kg (216 lb), SpO2 87 %. Filed Vitals:   07/10/15 0830 07/10/15 0900  BP: 156/71 137/81  Pulse: 96 89  Temp:       General:  NAD, WNWD  Eyes: EOMI, PERRL  ENT: dry mm  Neck: FROM, no thyromegaly  Cardiovascular: RRR, no m/r/g, 2+ pulses, no LE edema  Respiratory: CTAB, nml effort. On RA  Abdomen: NABS, nondistended, point tener to mild palpation at McBurny's point  Skin: no rash/ecchymosis  Musculoskeletal: strength symmetrical, no effusions  Psychiatric: nml affect and mood. Answers questions appropriately  Neurologic: CN2-12 grossly intact, moves all extremities in coordinated fashion  Labs on Admission:  Basic Metabolic Panel:  Recent Labs Lab 07/09/15 2354  NA 135  K 4.2  CL 98*  CO2 23  GLUCOSE 311*  BUN 19  CREATININE 1.19  CALCIUM 10.0   Liver Function Tests:  Recent Labs Lab 07/09/15 2354  AST 31  ALT 47  ALKPHOS 105  BILITOT 1.6*  PROT 7.6  ALBUMIN 4.1    Recent Labs Lab 07/09/15 2354  LIPASE 55*   No results for input(s): AMMONIA in the last 168 hours. CBC:  Recent Labs Lab 07/09/15 2354  WBC 12.1*  HGB 16.1  HCT 45.0  MCV 91.5  PLT 167   Cardiac Enzymes: No results for input(s): CKTOTAL, CKMB, CKMBINDEX, TROPONINI in the last 168 hours. BNP: Invalid input(s): POCBNP CBG: No results for input(s): GLUCAP in the last 168 hours.  Radiological Exams on Admission: Ct Abdomen Pelvis W Contrast  07/10/2015  CLINICAL DATA:  54 year old male with right lower quadrant abdominal pain EXAM: CT ABDOMEN AND PELVIS WITH CONTRAST TECHNIQUE: Multidetector CT imaging of the abdomen and pelvis was performed using the standard protocol following bolus administration of intravenous contrast. CONTRAST:  163m OMNIPAQUE IOHEXOL 300 MG/ML  SOLN COMPARISON:  CT dated 04/29/2014 FINDINGS: The visualized  lung bases are clear. There is coronary vascular calcification. No intra-abdominal free air or free fluid. Apparent diffuse hepatic steatosis. The gallbladder, pancreas appear unremarkable. Stable Splenomegaly measuring 17 cm in greatest length. The adrenal glands, kidneys, visualized ureters appear unremarkable. The urinary bladder is only partially distended. There is apparent diffuse thickening of the bladder wall which may be partly related to underdistention. Cystitis is not excluded. Correlation with urinalysis recommended. Caps there is mild enlargement of the prostate gland measuring 5.2 cm in transverse axial dimension. There is sigmoid diverticulosis without active inflammation. There is no evidence of bowel obstruction. The appendix is enlarged and inflamed. He is located in the right lower quadrant inferior and medial to the cecum. No drainable fluid collection/abscess identified. 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 An aortobifemoral bypass graft is noted and appears patent. Caps there is atherosclerotic  calcifications of the origins of the celiac axis, SMA. This vessels however remain patent. No portal venous gas identified. The splenic vein is patent. There is no adenopathy. Midline vertical anterior pelvic wall incisional scar. Degenerative changes of the spine. No acute fracture. IMPRESSION: Acute appendicitis.  No abscess. Electronically Signed   By: Anner Crete M.D.   On: 07/10/2015 02:52     Zayden Hahne J [facility]   If 7PM-7AM, please contact night-coverage [email or web address] Password [email or web address] 07/10/2015, 9:20 AM

## 2015-07-11 ENCOUNTER — Encounter (HOSPITAL_COMMUNITY): Payer: Self-pay | Admitting: General Surgery

## 2015-07-11 ENCOUNTER — Inpatient Hospital Stay (HOSPITAL_COMMUNITY): Payer: Medicare Other

## 2015-07-11 DIAGNOSIS — E872 Acidosis: Secondary | ICD-10-CM

## 2015-07-11 DIAGNOSIS — K3580 Unspecified acute appendicitis: Principal | ICD-10-CM

## 2015-07-11 LAB — CBC WITH DIFFERENTIAL/PLATELET
BASOS ABS: 0 10*3/uL (ref 0.0–0.1)
Basophils Relative: 0 %
EOS PCT: 3 %
Eosinophils Absolute: 0.3 10*3/uL (ref 0.0–0.7)
HEMATOCRIT: 39.8 % (ref 39.0–52.0)
Hemoglobin: 13.8 g/dL (ref 13.0–17.0)
LYMPHS ABS: 1 10*3/uL (ref 0.7–4.0)
LYMPHS PCT: 11 %
MCH: 31.9 pg (ref 26.0–34.0)
MCHC: 34.7 g/dL (ref 30.0–36.0)
MCV: 92.1 fL (ref 78.0–100.0)
MONOS PCT: 9 %
Monocytes Absolute: 0.8 10*3/uL (ref 0.1–1.0)
NEUTROS ABS: 6.9 10*3/uL (ref 1.7–7.7)
Neutrophils Relative %: 77 %
PLATELETS: UNDETERMINED 10*3/uL (ref 150–400)
RBC: 4.32 MIL/uL (ref 4.22–5.81)
RDW: 15.3 % (ref 11.5–15.5)
WBC: 9 10*3/uL (ref 4.0–10.5)

## 2015-07-11 LAB — BLOOD GAS, ARTERIAL
ACID-BASE DEFICIT: 0.7 mmol/L (ref 0.0–2.0)
BICARBONATE: 23.7 meq/L (ref 20.0–24.0)
Drawn by: 437071
FIO2: 0.6
MECHVT: 560 mL
O2 SAT: 99 %
PATIENT TEMPERATURE: 99.4
PEEP: 8 cmH2O
PO2 ART: 128 mmHg — AB (ref 80.0–100.0)
RATE: 16 resp/min
TCO2: 24.9 mmol/L (ref 0–100)
pCO2 arterial: 41.4 mmHg (ref 35.0–45.0)
pH, Arterial: 7.378 (ref 7.350–7.450)

## 2015-07-11 LAB — GLUCOSE, CAPILLARY
GLUCOSE-CAPILLARY: 173 mg/dL — AB (ref 65–99)
GLUCOSE-CAPILLARY: 203 mg/dL — AB (ref 65–99)
GLUCOSE-CAPILLARY: 215 mg/dL — AB (ref 65–99)
GLUCOSE-CAPILLARY: 243 mg/dL — AB (ref 65–99)
GLUCOSE-CAPILLARY: 257 mg/dL — AB (ref 65–99)
Glucose-Capillary: 225 mg/dL — ABNORMAL HIGH (ref 65–99)

## 2015-07-11 LAB — RENAL FUNCTION PANEL
ANION GAP: 8 (ref 5–15)
Albumin: 3.1 g/dL — ABNORMAL LOW (ref 3.5–5.0)
BUN: 18 mg/dL (ref 6–20)
CHLORIDE: 105 mmol/L (ref 101–111)
CO2: 24 mmol/L (ref 22–32)
Calcium: 8.6 mg/dL — ABNORMAL LOW (ref 8.9–10.3)
Creatinine, Ser: 1.23 mg/dL (ref 0.61–1.24)
GFR calc Af Amer: >60 mL/min (ref >60)
GFR calc non Af Amer: >60 mL/min (ref >60)
GLUCOSE: 196 mg/dL — AB (ref 65–99)
POTASSIUM: 3.9 mmol/L (ref 3.5–5.1)
Phosphorus: 3.5 mg/dL (ref 2.5–4.6)
Sodium: 137 mmol/L (ref 135–145)

## 2015-07-11 LAB — PROCALCITONIN: Procalcitonin: 0.27 ng/mL

## 2015-07-11 LAB — MAGNESIUM: Magnesium: 1.7 mg/dL (ref 1.7–2.4)

## 2015-07-11 LAB — HEMOGLOBIN A1C
HEMOGLOBIN A1C: 7.1 % — AB (ref 4.8–5.6)
Mean Plasma Glucose: 157 mg/dL

## 2015-07-11 LAB — TRIGLYCERIDES: Triglycerides: 385 mg/dL — ABNORMAL HIGH (ref <150)

## 2015-07-11 LAB — TROPONIN I

## 2015-07-11 MED ORDER — FENTANYL CITRATE (PF) 100 MCG/2ML IJ SOLN
12.5000 ug | INTRAMUSCULAR | Status: DC | PRN
  Administered 2015-07-11: 25 ug via INTRAVENOUS
  Filled 2015-07-11: qty 2

## 2015-07-11 MED ORDER — HYDRALAZINE HCL 20 MG/ML IJ SOLN: 10.0000 mg | INTRAMUSCULAR | Status: DC | PRN

## 2015-07-11 MED ORDER — INSULIN ASPART 100 UNIT/ML ~~LOC~~ SOLN
0.0000 [IU] | SUBCUTANEOUS | Status: DC
  Administered 2015-07-11: 5 [IU] via SUBCUTANEOUS
  Administered 2015-07-11: 8 [IU] via SUBCUTANEOUS
  Administered 2015-07-11 – 2015-07-12 (×2): 5 [IU] via SUBCUTANEOUS
  Administered 2015-07-12 (×5): 3 [IU] via SUBCUTANEOUS
  Administered 2015-07-12: 5 [IU] via SUBCUTANEOUS
  Administered 2015-07-13: 3 [IU] via SUBCUTANEOUS
  Administered 2015-07-13: 8 [IU] via SUBCUTANEOUS
  Administered 2015-07-13: 3 [IU] via SUBCUTANEOUS
  Administered 2015-07-13: 2 [IU] via SUBCUTANEOUS

## 2015-07-11 MED ORDER — FUROSEMIDE 10 MG/ML IJ SOLN
40.0000 mg | Freq: Four times a day (QID) | INTRAMUSCULAR | Status: AC
  Administered 2015-07-11 (×2): 40 mg via INTRAVENOUS
  Filled 2015-07-11 (×2): qty 4

## 2015-07-11 MED ORDER — INSULIN GLARGINE 100 UNIT/ML ~~LOC~~ SOLN
20.0000 [IU] | Freq: Every day | SUBCUTANEOUS | Status: DC
  Administered 2015-07-11 – 2015-07-13 (×3): 20 [IU] via SUBCUTANEOUS
  Filled 2015-07-11 (×4): qty 0.2

## 2015-07-11 MED ORDER — METOPROLOL TARTRATE 1 MG/ML IV SOLN
5.0000 mg | Freq: Four times a day (QID) | INTRAVENOUS | Status: DC
  Administered 2015-07-11 – 2015-07-12 (×4): 5 mg via INTRAVENOUS
  Filled 2015-07-11 (×4): qty 5

## 2015-07-11 MED ORDER — ENOXAPARIN SODIUM 40 MG/0.4ML ~~LOC~~ SOLN
40.0000 mg | SUBCUTANEOUS | Status: DC
  Administered 2015-07-11 – 2015-07-14 (×4): 40 mg via SUBCUTANEOUS
  Filled 2015-07-11 (×4): qty 0.4

## 2015-07-11 MED ORDER — FENTANYL CITRATE (PF) 100 MCG/2ML IJ SOLN
50.0000 ug | INTRAMUSCULAR | Status: DC | PRN
  Administered 2015-07-11 (×2): 75 ug via INTRAVENOUS
  Administered 2015-07-11: 50 ug via INTRAVENOUS
  Administered 2015-07-12: 75 ug via INTRAVENOUS
  Administered 2015-07-12 (×2): 50 ug via INTRAVENOUS
  Filled 2015-07-11 (×7): qty 2

## 2015-07-11 NOTE — Plan of Care (Signed)
Problem: Activity: Goal: Ability to tolerate increased activity will improve Outcome: Not Progressing Pt remains on bedrest will intubated Goal: Mobility will improve Outcome: Not Progressing See previous  Problem: Bowel/Gastric: Goal: Gastrointestinal status for postoperative course will improve Outcome: Not Progressing Minimal bowel sounds at this time.  Problem: Education: Goal: Will regain or maintain usual level of consciousness Outcome: Not Progressing Pt sedated on vent

## 2015-07-11 NOTE — Progress Notes (Signed)
PULMONARY / CRITICAL CARE MEDICINE   Name: Larry Terrell MRN: [id number] DOB: August 22, 1960    ADMISSION DATE:  07/10/2015 CONSULTATION DATE:  07/10/2015  REFERRING MD:  Rolm Bookbinder, M.D. / Surgery Service  CHIEF COMPLAINT:  Acute Hypoxic Respiratory Failure/Medical Management  STUDIES:  TTE (02/07/14 @ [facility]):  LV normal in size w/ EF 55-60%. Diastolic dysfunction. LA dilated. RA appeared normal in size. RV normal in size & function. No pericardial effusion. CT Abd/Pelvis W/ Contrast 12/28:  Diffuse hepatic steatosis. Gallbladder unremarkable w/ diffuse wall thickening. Splenomegaly to 17cm greatest length. Sigmoid diverticulosis without inflammation. Mild prostate enlargement. Enlarged & inflamed appendix without fluid collection or abscess. Aortobifemoral bypass noted.   CULTURES: Blood x2 12/28>>>  ANTIBIOTICS: Rocephin 12/28>>> Flagyl 12/28>>>  SIGNIFICANT EVENTS: 12/28 - Admit s/p Lap Appendectomy with Respiratory Failure & Intubation  LINES/TUBES: OETT 7.5 12/28>>>12/29 Foley Catheter 12/28>>> PIV x1>>>  SUBJECTIVE: resting comfortably on pressure support ventilation, glucose elevated, had lap appy yesterday  VITAL SIGNS: BP 134/73 mmHg  Pulse 95  Temp(Src) 101 F (38.3 C) (Axillary)  Resp 15  Ht 5' 9"  (1.753 m)  Wt 97.977 kg (216 lb)  BMI 31.88 kg/m2  SpO2 96%  HEMODYNAMICS:    VENTILATOR SETTINGS: Vent Mode:  [-] PSV;CPAP FiO2 (%):  [40 %-100 %] 40 % Set Rate:  [16 bmp-18 bmp] 16 bmp Vt Set:  [560 mL] 560 mL PEEP:  [5 cmH20-10 cmH20] 5 cmH20 Plateau Pressure:  [10 cmH20-23 cmH20] 20 cmH20  INTAKE / OUTPUT: I/O last 3 completed shifts: In: 7035 [I.V.:2009; IV Piggyback:3250] Out: 2325 [Urine:2005; Emesis/NG output:300; Blood:20]  PHYSICAL EXAMINATION: General:  Awake on vent HENT: NCAT ETT in place PULM: CTA B CV: RRR, no mgr GI: BS infrequent, surgical scar OK, no drainage or redness MSK: normal bulk and tone Derm: no rash or skin  breakdown Neuro: Awake on vent, follows commands  LABS:  BMET  Recent Labs Lab 07/09/15 2354 07/11/15 0520  NA 135 137  K 4.2 3.9  CL 98* 105  CO2 23 24  BUN 19 18  CREATININE 1.19 1.23  GLUCOSE 311* 196*    Electrolytes  Recent Labs Lab 07/09/15 2354 07/11/15 0520  CALCIUM 10.0 8.6*  MG  --  1.7  PHOS  --  3.5    CBC  Recent Labs Lab 07/09/15 2354 07/11/15 0520  WBC 12.1* 9.0  HGB 16.1 13.8  HCT 45.0 39.8  PLT 167 PLATELET CLUMPS NOTED ON SMEAR, UNABLE TO ESTIMATE    Coag's  Recent Labs Lab 07/10/15 0937  INR 1.18    Sepsis Markers  Recent Labs Lab 07/10/15 0253 07/10/15 0527 07/10/15 0800 07/10/15 1305 07/11/15 0520  LATICACIDVEN 2.18* 2.16* 1.7  --   --   PROCALCITON  --   --   --  0.25 0.27    ABG  Recent Labs Lab 07/10/15 1422 07/10/15 1840 07/11/15 0350  PHART 7.332* 7.317* 7.378  PCO2ART 42.8 46.1* 41.4  PO2ART 55.3* 253* 128*    Liver Enzymes  Recent Labs Lab 07/09/15 2354 07/11/15 0520  AST 31  --   ALT 47  --   ALKPHOS 105  --   BILITOT 1.6*  --   ALBUMIN 4.1 3.1*    Cardiac Enzymes No results for input(s): TROPONINI, PROBNP in the last 168 hours.  Glucose  Recent Labs Lab 07/10/15 1239 07/10/15 1622 07/10/15 1916 07/10/15 2334 07/11/15 0323 07/11/15 0736  GLUCAP 275* 292* 273* 222* 215* 225*    Imaging  12/29  CXR images personally reviewed> diffuse mild pulmonary edema, ETT in place  ASSESSMENT / PLAN:  PULMONARY A: Acute Hypoxic Respiratory Failure - improved H/O COPD H/O OSA Acute pulmonary edema > due to volume resuscitation, doubt decompensated CHF  P:   Extubate O2 as needed for O2 > 90%  INFECTIOUS A:   Sepsis  Acute Appendicitis - S/P Lap Appy 12/28  P:   F/u cultures Day #2 Rocephin & Flagyl   CARDIOVASCULAR A:  H/O Diastolic CHF based on [facility] TTE 2015 H/O CAD s/p MI - S/P Stent July 2015 per records H/O PAD - S/P Aorto-Bifemoral Bypass H/O HTN  P:  Monitor in  telemetry Change metoprolol to IV until taking PO F/U TTE Check 12 lead EKG Add prn hydralazine Lasix again today Strick I/O   GASTROINTESTINAL A:   Acute Appendicitis - S/P Lap Appy 12/28 by Rolm Bookbinder Liver Cirrhosis - NASH H/O Splenomegaly  P:   Management per Surgery Service Diet per surgery Protonix IV daily  RENAL A:   No acute issues  P:   Monitor BMET and UOP Replace electrolytes as needed   HEMATOLOGIC A:   No acute issues  P:  Monitor for bleeding  ENDOCRINE A:   DM Type 2 - BG uncontrolled    P:   Accu-Checks q4hr while NPO SSI per algorithm F/u A1c Add glargine  NEUROLOGIC A:   Sedation on Ventilator > resolved Post op pain H/O Diabetic Neuropathy H/O Depression  P:   D/c sedation Fentanyl IV prn pain   FAMILY  - Updates:  No family at bedside.   - Inter-disciplinary family meet or Palliative Care meeting due by:  07/17/15  My cc time 29 minutes  Roselie Awkward, MD [place] [facility] Pager: [phone] Cell: [phone] After 3pm or if no response, call [phone]

## 2015-07-11 NOTE — Progress Notes (Signed)
  Echocardiogram 2D Echocardiogram has been performed.  Donata Clay 07/11/2015, 9:45 AM

## 2015-07-11 NOTE — Progress Notes (Signed)
Utilization Review Completed.Deepa Barthel T12/29/2016  

## 2015-07-11 NOTE — Procedures (Signed)
Extubation Procedure Note  Patient Details:   Name: Larry Terrell DOB: June 06, 1961 MRN: [id number]   Airway Documentation:     Evaluation  O2 sats: stable throughout Complications: No apparent complications Patient did tolerate procedure well. Bilateral Breath Sounds: Rhonchi   Yes  Placed n 4l/min [name] IS instructed and 1250 ml's achieved.  Revonda Standard 07/11/2015, 10:57 AM

## 2015-07-11 NOTE — Progress Notes (Signed)
eLink Physician-Brief Progress Note Patient Name: Larry Terrell DOB: [birth year] MRN: [id number]   Date of Service  07/11/2015  HPI/Events of Note  Pain - not controlled with 25-50 mcg Fentanyl IV Q 1 hour.   eICU Interventions  Will increase Fentanyl to 50 - 100 mcg Fentanyl IV Q 1 hour.      Intervention Category Intermediate Interventions: Pain - evaluation and management  Sommer,Steven Eugene 07/11/2015, 4:06 PM

## 2015-07-11 NOTE — Progress Notes (Signed)
Inpatient Diabetes Program Recommendations  AACE/ADA: New Consensus Statement on Inpatient Glycemic Control (2015)  Target Ranges:  Prepandial:   less than 140 mg/dL      Peak postprandial:   less than 180 mg/dL (1-2 hours)      Critically ill patients:  140 - 180 mg/dL   Results for SAMARION, EHLE (MRN [id number]) as of 07/11/2015 10:47  Ref. Range 07/10/2015 09:40 07/10/2015 12:39 07/10/2015 16:22 07/10/2015 19:16 07/10/2015 23:34 07/11/2015 03:23 07/11/2015 07:36  Glucose-Capillary Latest Ref Range: 65-99 mg/dL 208 (H) 275 (H) 292 (H) 273 (H) 222 (H) 215 (H) 225 (H)   Review of Glycemic Control  Diabetes history: DM 2 Outpatient Diabetes medications: 70/30 24 units QAM, 20 units QPM Current orders for Inpatient glycemic control: Novolog Sensitive Q4hrs  Inpatient Diabetes Program Recommendations:  Insulin - Basal: Glucose consistently in the 200's. Patient takes 70/30 at home with basal insulin component (total amount of basal per home dose 30.8 units/total amount of short acting 13.2 units). Please consider starting Lantus 20 units Q24hrs.  Thanks,  Tama Headings RN, MSN, [facility] Inpatient Diabetes Coordinator Team Pager [phone] (8a-5p)

## 2015-07-11 NOTE — Progress Notes (Signed)
Patient ID: Larry Terrell, male   DOB: 11/12/1960, 54 y.o.   MRN: [id number]     [address] [place], [place] [address]    Phone: [phone] FAX: [phone]     Subjective: CBGs high 200+,  T max 100.5.  BP stable.  Awake on the vent Diuresed with lasix. WBC normalized.  Lactic acid 1.7.   Objective:  Vital signs:  Filed Vitals:   07/11/15 0400 07/11/15 0500 07/11/15 0600 07/11/15 0700  BP: 105/67 102/72 117/73 151/81  Pulse: 94 95 92 95  Temp:      TempSrc:      Resp: 15 15 16 20   Height:      Weight:      SpO2: 97% 98% 100% 100%       Intake/Output   Yesterday:  12/28 0701 - 12/29 0700 In: 9528 [I.V.:2009; IV Piggyback:3250] Out: 2325 [Urine:2005; Emesis/NG output:300; Blood:20] This shift: I/O last 3 completed shifts: In: 4132 [I.V.:2009; IV Piggyback:3250] Out: 2325 [Urine:2005; Emesis/NG output:300; Blood:20]    Physical Exam: General: Pt awake on the vent following commands.  Chest: cta.  CV:  Pulses intact.  Regular rhythm Abdomen: +Bs, abdomen is soft, incisions with steri strips, no erythema or drainage.  Ext:  SCDs BLE.  No mjr edema.  No cyanosis Skin: No petechiae / purpura   Problem List:   Active Problems:   Acute appendicitis   Appendicitis, acute   Diabetes mellitus with complication (HCC)   Chronic congestive heart failure with left ventricular diastolic dysfunction (HCC)   Lactic acidosis   S/P laparoscopic appendectomy   Acute respiratory failure with hypoxia ([place])    Results:   Labs: Results for orders placed or performed during the hospital encounter of 07/10/15 (from the past 48 hour(s))  Urinalysis, Routine w reflex microscopic (not at [facility])     Status: Abnormal   Collection Time: 07/09/15 11:40 PM  Result Value Ref Range   Color, Urine YELLOW YELLOW   APPearance CLEAR CLEAR   Specific Gravity, Urine 1.033 (H) 1.005 - 1.030   pH 5.0 5.0 - 8.0    Glucose, UA >1000 (A) NEGATIVE mg/dL   Hgb urine dipstick TRACE (A) NEGATIVE   Bilirubin Urine NEGATIVE NEGATIVE   Ketones, ur 15 (A) NEGATIVE mg/dL   Protein, ur >300 (A) NEGATIVE mg/dL   Nitrite NEGATIVE NEGATIVE   Leukocytes, UA NEGATIVE NEGATIVE  Urine microscopic-add on     Status: Abnormal   Collection Time: 07/09/15 11:40 PM  Result Value Ref Range   Squamous Epithelial / LPF 0-5 (A) NONE SEEN   WBC, UA NONE SEEN 0 - 5 WBC/hpf   RBC / HPF NONE SEEN 0 - 5 RBC/hpf   Bacteria, UA RARE (A) NONE SEEN  Lipase, blood     Status: Abnormal   Collection Time: 07/09/15 11:54 PM  Result Value Ref Range   Lipase 55 (H) 11 - 51 U/L  Comprehensive metabolic panel     Status: Abnormal   Collection Time: 07/09/15 11:54 PM  Result Value Ref Range   Sodium 135 135 - 145 mmol/L   Potassium 4.2 3.5 - 5.1 mmol/L   Chloride 98 (L) 101 - 111 mmol/L   CO2 23 22 - 32 mmol/L   Glucose, Bld 311 (H) 65 - 99 mg/dL   BUN 19 6 - 20 mg/dL   Creatinine, Ser 1.19 0.61 - 1.24 mg/dL   Calcium 10.0  8.9 - 10.3 mg/dL   Total Protein 7.6 6.5 - 8.1 g/dL   Albumin 4.1 3.5 - 5.0 g/dL   AST 31 15 - 41 U/L   ALT 47 17 - 63 U/L   Alkaline Phosphatase 105 38 - 126 U/L   Total Bilirubin 1.6 (H) 0.3 - 1.2 mg/dL   GFR calc non Af Amer >60 >60 mL/min   GFR calc Af Amer >60 >60 mL/min    Comment: (NOTE) The eGFR has been calculated using the CKD EPI equation. This calculation has not been validated in all clinical situations. eGFR's persistently <60 mL/min signify possible Chronic Kidney Disease.    Anion gap 14 5 - 15  CBC     Status: Abnormal   Collection Time: 07/09/15 11:54 PM  Result Value Ref Range   WBC 12.1 (H) 4.0 - 10.5 K/uL   RBC 4.92 4.22 - 5.81 MIL/uL   Hemoglobin 16.1 13.0 - 17.0 g/dL   HCT 45.0 39.0 - 52.0 %   MCV 91.5 78.0 - 100.0 fL   MCH 32.7 26.0 - 34.0 pg   MCHC 35.8 30.0 - 36.0 g/dL   RDW 14.9 11.5 - 15.5 %   Platelets 167 150 - 400 K/uL  I-Stat CG4 Lactic Acid, ED     Status: Abnormal    Collection Time: 07/10/15  2:53 AM  Result Value Ref Range   Lactic Acid, Venous 2.18 (HH) 0.5 - 2.0 mmol/L   Comment NOTIFIED PHYSICIAN   I-Stat CG4 Lactic Acid, ED     Status: Abnormal   Collection Time: 07/10/15  5:27 AM  Result Value Ref Range   Lactic Acid, Venous 2.16 (HH) 0.5 - 2.0 mmol/L   Comment NOTIFIED PHYSICIAN   Lactic acid, plasma     Status: None   Collection Time: 07/10/15  8:00 AM  Result Value Ref Range   Lactic Acid, Venous 1.7 0.5 - 2.0 mmol/L  Protime-INR     Status: None   Collection Time: 07/10/15  9:37 AM  Result Value Ref Range   Prothrombin Time 15.1 11.6 - 15.2 seconds   INR 1.18 0.00 - 1.49  Glucose, capillary     Status: Abnormal   Collection Time: 07/10/15  9:40 AM  Result Value Ref Range   Glucose-Capillary 208 (H) 65 - 99 mg/dL  Hemoglobin A1c     Status: Abnormal   Collection Time: 07/10/15 10:04 AM  Result Value Ref Range   Hgb A1c MFr Bld 7.1 (H) 4.8 - 5.6 %    Comment: (NOTE)         Pre-diabetes: 5.7 - 6.4         Diabetes: >6.4         Glycemic control for adults with diabetes: <7.0    Mean Plasma Glucose 157 mg/dL    Comment: (NOTE) Performed At: [facility] [address] [place], [place] [id number] Lindon Romp MD [id number]   Glucose, capillary     Status: Abnormal   Collection Time: 07/10/15 12:39 PM  Result Value Ref Range   Glucose-Capillary 275 (H) 65 - 99 mg/dL   Comment 1 Notify RN    Comment 2 Document in Chart   Brain natriuretic peptide     Status: Abnormal   Collection Time: 07/10/15  1:05 PM  Result Value Ref Range   B Natriuretic Peptide 436.5 (H) 0.0 - 100.0 pg/mL  Procalcitonin - Baseline     Status: None   Collection Time: 07/10/15  1:05 PM  Result Value Ref Range   Procalcitonin 0.25 ng/mL    Comment:        Interpretation: PCT (Procalcitonin) <= 0.5 ng/mL: Systemic infection (sepsis) is not likely. Local bacterial infection is possible. (NOTE)         ICU PCT Algorithm                Non ICU PCT Algorithm    ----------------------------     ------------------------------         PCT < 0.25 ng/mL                 PCT < 0.1 ng/mL     Stopping of antibiotics            Stopping of antibiotics       strongly encouraged.               strongly encouraged.    ----------------------------     ------------------------------       PCT level decrease by               PCT < 0.25 ng/mL       >= 80% from peak PCT       OR PCT 0.25 - 0.5 ng/mL          Stopping of antibiotics                                             encouraged.     Stopping of antibiotics           encouraged.    ----------------------------     ------------------------------       PCT level decrease by              PCT >= 0.25 ng/mL       < 80% from peak PCT        AND PCT >= 0.5 ng/mL            Continuin g antibiotics                                              encouraged.       Continuing antibiotics            encouraged.    ----------------------------     ------------------------------     PCT level increase compared          PCT > 0.5 ng/mL         with peak PCT AND          PCT >= 0.5 ng/mL             Escalation of antibiotics                                          strongly encouraged.      Escalation of antibiotics        strongly encouraged.   Blood gas, arterial     Status: Abnormal   Collection Time: 07/10/15  2:22 PM  Result Value Ref Range   O2 Content 15.0 L/min   Delivery systems CONTINUOUS POSITIVE AIRWAY PRESSURE    Expiratory  PAP 18.0     Comment: CORRECTED ON 12/28 AT 1448: PREVIOUSLY REPORTED AS 15.0   pH, Arterial 7.332 (L) 7.350 - 7.450   pCO2 arterial 42.8 35.0 - 45.0 mmHg   pO2, Arterial 55.3 (L) 80.0 - 100.0 mmHg   Bicarbonate 22.3 20.0 - 24.0 mEq/L   TCO2 23.6 0 - 100 mmol/L   Acid-base deficit 2.8 (H) 0.0 - 2.0 mmol/L   O2 Saturation 87.0 %   Patient temperature 97.4    Collection site LEFT RADIAL    Drawn by [id number]    Sample type ARTERIAL DRAW    Allens test  (pass/fail) PASS PASS  MRSA PCR Screening     Status: None   Collection Time: 07/10/15  4:20 PM  Result Value Ref Range   MRSA by PCR NEGATIVE NEGATIVE    Comment:        The GeneXpert MRSA Assay (FDA approved for NASAL specimens only), is one component of a comprehensive MRSA colonization surveillance program. It is not intended to diagnose MRSA infection nor to guide or monitor treatment for MRSA infections.   Glucose, capillary     Status: Abnormal   Collection Time: 07/10/15  4:22 PM  Result Value Ref Range   Glucose-Capillary 292 (H) 65 - 99 mg/dL   Comment 1 Capillary Specimen    Comment 2 Notify RN   Blood gas, arterial     Status: Abnormal   Collection Time: 07/10/15  6:40 PM  Result Value Ref Range   FIO2 1.00    Delivery systems VENTILATOR    Mode PRESSURE REGULATED VOLUME CONTROL    VT 560.0 mL   LHR 16.0 resp/min   Peep/cpap 10.0 cm H20   pH, Arterial 7.317 (L) 7.350 - 7.450   pCO2 arterial 46.1 (H) 35.0 - 45.0 mmHg   pO2, Arterial 253 (H) 80.0 - 100.0 mmHg   Bicarbonate 22.9 20.0 - 24.0 mEq/L   TCO2 24.3 0 - 100 mmol/L   Acid-base deficit 2.3 (H) 0.0 - 2.0 mmol/L   O2 Saturation 99.9 %   Patient temperature 98.6    Collection site LEFT RADIAL    Drawn by [id number]    Sample type ARTERIAL DRAW    Allens test (pass/fail) PASS PASS  Glucose, capillary     Status: Abnormal   Collection Time: 07/10/15  7:16 PM  Result Value Ref Range   Glucose-Capillary 273 (H) 65 - 99 mg/dL   Comment 1 Capillary Specimen    Comment 2 Notify RN    Comment 3 Document in Chart   Glucose, capillary     Status: Abnormal   Collection Time: 07/10/15 11:34 PM  Result Value Ref Range   Glucose-Capillary 222 (H) 65 - 99 mg/dL   Comment 1 Capillary Specimen    Comment 2 Notify RN    Comment 3 Document in Chart   Triglycerides     Status: Abnormal   Collection Time: 07/11/15  3:21 AM  Result Value Ref Range   Triglycerides 385 (H) <150 mg/dL  Glucose, capillary     Status:  Abnormal   Collection Time: 07/11/15  3:23 AM  Result Value Ref Range   Glucose-Capillary 215 (H) 65 - 99 mg/dL   Comment 1 Capillary Specimen    Comment 2 Notify RN    Comment 3 Document in Chart   Blood gas, arterial     Status: Abnormal   Collection Time: 07/11/15  3:50 AM  Result Value Ref Range   FIO2 0.60  Delivery systems VENTILATOR    Mode PRESSURE REGULATED VOLUME CONTROL    VT 560 mL   LHR 16 resp/min   Peep/cpap 8.0 cm H20   pH, Arterial 7.378 7.350 - 7.450   pCO2 arterial 41.4 35.0 - 45.0 mmHg   pO2, Arterial 128 (H) 80.0 - 100.0 mmHg   Bicarbonate 23.7 20.0 - 24.0 mEq/L   TCO2 24.9 0 - 100 mmol/L   Acid-base deficit 0.7 0.0 - 2.0 mmol/L   O2 Saturation 99.0 %   Patient temperature 99.4    Collection site RIGHT BRACHIAL    Drawn by [id number]    Sample type ARTERIAL DRAW    Allens test (pass/fail) PASS PASS  Renal function panel     Status: Abnormal   Collection Time: 07/11/15  5:20 AM  Result Value Ref Range   Sodium 137 135 - 145 mmol/L   Potassium 3.9 3.5 - 5.1 mmol/L   Chloride 105 101 - 111 mmol/L   CO2 24 22 - 32 mmol/L   Glucose, Bld 196 (H) 65 - 99 mg/dL   BUN 18 6 - 20 mg/dL   Creatinine, Ser 1.23 0.61 - 1.24 mg/dL   Calcium 8.6 (L) 8.9 - 10.3 mg/dL   Phosphorus 3.5 2.5 - 4.6 mg/dL   Albumin 3.1 (L) 3.5 - 5.0 g/dL   GFR calc non Af Amer >60 >60 mL/min   GFR calc Af Amer >60 >60 mL/min    Comment: (NOTE) The eGFR has been calculated using the CKD EPI equation. This calculation has not been validated in all clinical situations. eGFR's persistently <60 mL/min signify possible Chronic Kidney Disease.    Anion gap 8 5 - 15  CBC with Differential/Platelet     Status: None   Collection Time: 07/11/15  5:20 AM  Result Value Ref Range   WBC 9.0 4.0 - 10.5 K/uL    Comment: WHITE COUNT CONFIRMED ON SMEAR   RBC 4.32 4.22 - 5.81 MIL/uL   Hemoglobin 13.8 13.0 - 17.0 g/dL    Comment: REPEATED TO VERIFY   HCT 39.8 39.0 - 52.0 %   MCV 92.1 78.0 - 100.0 fL    MCH 31.9 26.0 - 34.0 pg   MCHC 34.7 30.0 - 36.0 g/dL   RDW 15.3 11.5 - 15.5 %   Platelets PLATELET CLUMPS NOTED ON SMEAR, UNABLE TO ESTIMATE 150 - 400 K/uL   Neutrophils Relative % 77 %   Lymphocytes Relative 11 %   Monocytes Relative 9 %   Eosinophils Relative 3 %   Basophils Relative 0 %   Neutro Abs 6.9 1.7 - 7.7 K/uL   Lymphs Abs 1.0 0.7 - 4.0 K/uL   Monocytes Absolute 0.8 0.1 - 1.0 K/uL   Eosinophils Absolute 0.3 0.0 - 0.7 K/uL   Basophils Absolute 0.0 0.0 - 0.1 K/uL  Magnesium     Status: None   Collection Time: 07/11/15  5:20 AM  Result Value Ref Range   Magnesium 1.7 1.7 - 2.4 mg/dL  Procalcitonin     Status: None   Collection Time: 07/11/15  5:20 AM  Result Value Ref Range   Procalcitonin 0.27 ng/mL    Comment:        Interpretation: PCT (Procalcitonin) <= 0.5 ng/mL: Systemic infection (sepsis) is not likely. Local bacterial infection is possible. (NOTE)         ICU PCT Algorithm               Non ICU PCT Algorithm    ----------------------------     ------------------------------  PCT < 0.25 ng/mL                 PCT < 0.1 ng/mL     Stopping of antibiotics            Stopping of antibiotics       strongly encouraged.               strongly encouraged.    ----------------------------     ------------------------------       PCT level decrease by               PCT < 0.25 ng/mL       >= 80% from peak PCT       OR PCT 0.25 - 0.5 ng/mL          Stopping of antibiotics                                             encouraged.     Stopping of antibiotics           encouraged.    ----------------------------     ------------------------------       PCT level decrease by              PCT >= 0.25 ng/mL       < 80% from peak PCT        AND PCT >= 0.5 ng/mL            Continuin g antibiotics                                              encouraged.       Continuing antibiotics            encouraged.    ----------------------------      ------------------------------     PCT level increase compared          PCT > 0.5 ng/mL         with peak PCT AND          PCT >= 0.5 ng/mL             Escalation of antibiotics                                          strongly encouraged.      Escalation of antibiotics        strongly encouraged.     Imaging / Studies: Ct Abdomen Pelvis W Contrast  07/10/2015  CLINICAL DATA:  54 year old male with right lower quadrant abdominal pain EXAM: CT ABDOMEN AND PELVIS WITH CONTRAST TECHNIQUE: Multidetector CT imaging of the abdomen and pelvis was performed using the standard protocol following bolus administration of intravenous contrast. CONTRAST:  158m OMNIPAQUE IOHEXOL 300 MG/ML  SOLN COMPARISON:  CT dated 04/29/2014 FINDINGS: The visualized lung bases are clear. There is coronary vascular calcification. No intra-abdominal free air or free fluid. Apparent diffuse hepatic steatosis. The gallbladder, pancreas appear unremarkable. Stable Splenomegaly measuring 17 cm in greatest length. The adrenal glands, kidneys, visualized ureters appear unremarkable. The urinary bladder is only partially distended. There is apparent diffuse thickening of the  bladder wall which may be partly related to underdistention. Cystitis is not excluded. Correlation with urinalysis recommended. Caps there is mild enlargement of the prostate gland measuring 5.2 cm in transverse axial dimension. There is sigmoid diverticulosis without active inflammation. There is no evidence of bowel obstruction. The appendix is enlarged and inflamed. He is located in the right lower quadrant inferior and medial to the cecum. No drainable fluid collection/abscess identified. 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 An aortobifemoral bypass graft is noted and appears patent. Caps there is atherosclerotic calcifications of the origins of the celiac axis, SMA. This  vessels however remain patent. No portal venous gas identified. The splenic vein is patent. There is no adenopathy. Midline vertical anterior pelvic wall incisional scar. Degenerative changes of the spine. No acute fracture. IMPRESSION: Acute appendicitis.  No abscess. Electronically Signed   By: Anner Crete M.D.   On: 07/10/2015 02:52   Dg Chest Port 1 View  07/10/2015  CLINICAL DATA:  Status post intubation EXAM: PORTABLE CHEST - 1 VIEW COMPARISON:  07/10/2015 FINDINGS: Cardiac shadow is stable. Pulmonary edema is again identified. An endotracheal tube is been placed at 2.3 cm above the carina. No focal confluent infiltrate is seen. The overall inspiratory effort is poor. IMPRESSION: Stable pulmonary edema. Poor inspiratory effort. Endotracheal tube in satisfactory position. Electronically Signed   By: Inez Catalina M.D.   On: 07/10/2015 15:27   Dg Chest Port 1 View  07/10/2015  CLINICAL DATA:  Pulmonary edema.  Acute appendicitis. EXAM: PORTABLE CHEST 1 VIEW COMPARISON:  04/08/2015 FINDINGS: The patient has developed diffuse interstitial accentuation consistent with pulmonary edema. Heart size and pulmonary vascularity are normal. No acute osseous abnormality. IMPRESSION: Diffuse slight interstitial pulmonary edema. Electronically Signed   By: Lorriane Shire M.D.   On: 07/10/2015 13:11    Medications / Allergies:  Scheduled Meds: . antiseptic oral rinse  7 mL Mouth Rinse QID  . cefTRIAXone (ROCEPHIN)  IV  2 g Intravenous Q24H  . chlorhexidine gluconate  15 mL Mouth Rinse BID  . citalopram  40 mg Oral Daily  . insulin aspart  0-9 Units Subcutaneous 6 times per day  . metoprolol  50 mg Oral BID  . metronidazole  500 mg Intravenous Q8H  . pantoprazole (PROTONIX) IV  40 mg Intravenous QHS   Continuous Infusions: . sodium chloride 10 mL/hr at 07/11/15 0038  . propofol (DIPRIVAN) infusion 50 mcg/kg/min (07/11/15 0658)   PRN Meds:.fentaNYL (SUBLIMAZE) injection,  nitroGLYCERIN  Antibiotics: Anti-infectives    Start     Dose/Rate Route Frequency Ordered Stop   07/11/15 0800  cefTRIAXone (ROCEPHIN) 2 g in dextrose 5 % 50 mL IVPB     2 g 100 mL/hr over 30 Minutes Intravenous Every 24 hours 07/10/15 1615     07/11/15 0400  metroNIDAZOLE (FLAGYL) IVPB 500 mg     500 mg 100 mL/hr over 60 Minutes Intravenous Every 8 hours 07/10/15 1907     07/10/15 1630  metroNIDAZOLE (FLAGYL) IVPB 500 mg  Status:  Discontinued     500 mg 100 mL/hr over 60 Minutes Intravenous Every 8 hours 07/10/15 1615 07/10/15 1907   07/10/15 0830  [MAR Hold]  cefTRIAXone (ROCEPHIN) 2 g in dextrose 5 % 50 mL IVPB  Status:  Discontinued     (MAR Hold since 07/10/15 0931)   2 g 100 mL/hr over 30 Minutes Intravenous Every 24 hours 07/10/15 0826 07/10/15 1615  07/10/15 0830  [MAR Hold]  metroNIDAZOLE (FLAGYL) IVPB 500 mg  Status:  Discontinued     (MAR Hold since 07/10/15 0931)   500 mg 100 mL/hr over 60 Minutes Intravenous Every 8 hours 07/10/15 0826 07/10/15 1615   07/10/15 0745  cefTRIAXone (ROCEPHIN) 2 g in dextrose 5 % 50 mL IVPB     2 g 100 mL/hr over 30 Minutes Intravenous  Once 07/10/15 0737 07/10/15 0838   07/10/15 0745  metroNIDAZOLE (FLAGYL) IVPB 500 mg     500 mg 100 mL/hr over 60 Minutes Intravenous  Once 07/10/15 6979 07/10/15 4801        Assessment/Plan Acute appendicitis POD#1 laparoscopic appendectomy---Dr. Donne Hazel -stable, watch for ileus, continue antibiotics Acute respiratory failure-pulm edema v aspiration.  Wean to extubate per CCM.  Appreciate management  ID-recephin/flagyl 1/5D VTE prophylaxis-SCD, add lovenox DM II-CBGs/SSI CAD-hx CHF, CAD, MI s/p stent, aorto-bifem bypass, HTN FEN-NPO, clears once extubated  Dispo-ICU   Erby Pian, ANP-BC [facility] Pager [phone](7A-4:30P) For consults and floor pages call [phone](7A-4:30P)  07/11/2015 7:39 AM

## 2015-07-12 LAB — CBC WITH DIFFERENTIAL/PLATELET
BASOS ABS: 0 10*3/uL (ref 0.0–0.1)
BASOS PCT: 0 %
EOS ABS: 0.5 10*3/uL (ref 0.0–0.7)
EOS PCT: 7 %
HCT: 38.1 % — ABNORMAL LOW (ref 39.0–52.0)
Hemoglobin: 13.2 g/dL (ref 13.0–17.0)
LYMPHS PCT: 16 %
Lymphs Abs: 1.1 10*3/uL (ref 0.7–4.0)
MCH: 31.8 pg (ref 26.0–34.0)
MCHC: 34.6 g/dL (ref 30.0–36.0)
MCV: 91.8 fL (ref 78.0–100.0)
Monocytes Absolute: 0.7 10*3/uL (ref 0.1–1.0)
Monocytes Relative: 10 %
Neutro Abs: 4.6 10*3/uL (ref 1.7–7.7)
Neutrophils Relative %: 67 %
PLATELETS: 128 10*3/uL — AB (ref 150–400)
RBC: 4.15 MIL/uL — AB (ref 4.22–5.81)
RDW: 14.8 % (ref 11.5–15.5)
WBC: 7 10*3/uL (ref 4.0–10.5)

## 2015-07-12 LAB — GLUCOSE, CAPILLARY
GLUCOSE-CAPILLARY: 190 mg/dL — AB (ref 65–99)
GLUCOSE-CAPILLARY: 176 mg/dL — AB (ref 65–99)
GLUCOSE-CAPILLARY: 207 mg/dL — AB (ref 65–99)
GLUCOSE-CAPILLARY: 182 mg/dL — AB (ref 65–99)
GLUCOSE-CAPILLARY: 152 mg/dL — AB (ref 65–99)
Glucose-Capillary: 212 mg/dL — ABNORMAL HIGH (ref 65–99)

## 2015-07-12 LAB — BASIC METABOLIC PANEL
ANION GAP: 10 (ref 5–15)
BUN: 14 mg/dL (ref 6–20)
CALCIUM: 9 mg/dL (ref 8.9–10.3)
CO2: 30 mmol/L (ref 22–32)
Chloride: 100 mmol/L — ABNORMAL LOW (ref 101–111)
Creatinine, Ser: 1.15 mg/dL (ref 0.61–1.24)
GFR calc Af Amer: >60 mL/min (ref >60)
GFR calc non Af Amer: >60 mL/min (ref >60)
GLUCOSE: 174 mg/dL — AB (ref 65–99)
POTASSIUM: 3.4 mmol/L — AB (ref 3.5–5.1)
Sodium: 140 mmol/L (ref 135–145)

## 2015-07-12 MED ORDER — FUROSEMIDE 20 MG PO TABS
20.0000 mg | ORAL_TABLET | Freq: Every day | ORAL | Status: DC
  Administered 2015-07-12 – 2015-07-14 (×3): 20 mg via ORAL
  Filled 2015-07-12 (×3): qty 1

## 2015-07-12 MED ORDER — TRAMADOL HCL 50 MG PO TABS
50.0000 mg | ORAL_TABLET | Freq: Four times a day (QID) | ORAL | Status: DC | PRN
  Administered 2015-07-12 – 2015-07-14 (×3): 50 mg via ORAL
  Filled 2015-07-12 (×3): qty 1

## 2015-07-12 MED ORDER — PREDNISOLONE ACETATE 1 % OP SUSP
1.0000 [drp] | Freq: Four times a day (QID) | OPHTHALMIC | Status: DC
  Administered 2015-07-12 – 2015-07-14 (×9): 1 [drp] via OPHTHALMIC
  Filled 2015-07-12: qty 5
  Filled 2015-07-12: qty 1

## 2015-07-12 MED ORDER — POTASSIUM CHLORIDE CRYS ER 20 MEQ PO TBCR
40.0000 meq | EXTENDED_RELEASE_TABLET | Freq: Once | ORAL | Status: AC
  Administered 2015-07-12: 40 meq via ORAL
  Filled 2015-07-12: qty 2

## 2015-07-12 MED ORDER — OXYCODONE-ACETAMINOPHEN 5-325 MG PO TABS
1.0000 | ORAL_TABLET | ORAL | Status: DC | PRN
  Administered 2015-07-12 – 2015-07-14 (×6): 2 via ORAL
  Filled 2015-07-12 (×6): qty 2

## 2015-07-12 MED ORDER — GABAPENTIN 300 MG PO CAPS
900.0000 mg | ORAL_CAPSULE | Freq: Three times a day (TID) | ORAL | Status: DC
  Administered 2015-07-12 – 2015-07-14 (×7): 900 mg via ORAL
  Filled 2015-07-12 (×7): qty 3

## 2015-07-12 MED ORDER — METOPROLOL TARTRATE 50 MG PO TABS
50.0000 mg | ORAL_TABLET | Freq: Two times a day (BID) | ORAL | Status: DC
  Administered 2015-07-12 – 2015-07-14 (×5): 50 mg via ORAL
  Filled 2015-07-12 (×5): qty 1

## 2015-07-12 MED ORDER — MORPHINE SULFATE (PF) 2 MG/ML IV SOLN
2.0000 mg | INTRAVENOUS | Status: DC | PRN
Start: 2015-07-12 — End: 2015-07-14

## 2015-07-12 MED ORDER — HYDROCHLOROTHIAZIDE 25 MG PO TABS: 25.0000 mg | ORAL_TABLET | Freq: Every day | ORAL | Status: DC | PRN

## 2015-07-12 NOTE — Progress Notes (Signed)
Patient ID: Larry Terrell, male   DOB: 11-24-1960, 54 y.o.   MRN: [id number]     [address] [place], [place] [address]    Phone: [phone] FAX: [phone]     Subjective: Sitting up in a chair.  Walked in hallway.   VSS.  Afebrile. On [place].  Using IS. Passing flatus.  Tolerating clears.  Sore.   Objective:  Vital signs:  Filed Vitals:   07/12/15 0500 07/12/15 0600 07/12/15 0700 07/12/15 0731  BP: 148/73 153/74 126/70   Pulse: 83 81 79   Temp:    98.7 F (37.1 C)  TempSrc:    Oral  Resp: 17 16 10    Height:      Weight:  92.08 kg (203 lb)    SpO2: 95% 97% 96%        Intake/Output   Yesterday:  12/29 0701 - 12/30 0700 In: 1317.4 [P.O.:890; I.V.:77.4; IV Piggyback:350] Out: 6644 [Urine:3225; Emesis/NG output:150] This shift:  Total I/O In: 32 [IV Piggyback:50] Out: -    Physical Exam: General: Pt awake on the vent following commands.  Chest: cta.  CV: Pulses intact. Regular rhythm Abdomen: +Bs, abdomen is soft, incisions with steri strips, no erythema or drainage.  Ext: SCDs BLE. No mjr edema. No cyanosis Skin: No petechiae / purpura  Problem List:   Active Problems:   Acute appendicitis   Appendicitis, acute   Diabetes mellitus with complication (HCC)   Chronic congestive heart failure with left ventricular diastolic dysfunction (HCC)   Lactic acidosis   S/P laparoscopic appendectomy   Acute respiratory failure with hypoxia ([place])    Results:   Labs: Results for orders placed or performed during the hospital encounter of 07/10/15 (from the past 48 hour(s))  Protime-INR     Status: None   Collection Time: 07/10/15  9:37 AM  Result Value Ref Range   Prothrombin Time 15.1 11.6 - 15.2 seconds   INR 1.18 0.00 - 1.49  Glucose, capillary     Status: Abnormal   Collection Time: 07/10/15  9:40 AM  Result Value Ref Range   Glucose-Capillary 208 (H) 65 - 99 mg/dL  Hemoglobin A1c      Status: Abnormal   Collection Time: 07/10/15 10:04 AM  Result Value Ref Range   Hgb A1c MFr Bld 7.1 (H) 4.8 - 5.6 %    Comment: (NOTE)         Pre-diabetes: 5.7 - 6.4         Diabetes: >6.4         Glycemic control for adults with diabetes: <7.0    Mean Plasma Glucose 157 mg/dL    Comment: (NOTE) Performed At: [facility] [place], [place] [id number] Lindon Romp MD GL:[id number]   Glucose, capillary     Status: Abnormal   Collection Time: 07/10/15 12:39 PM  Result Value Ref Range   Glucose-Capillary 275 (H) 65 - 99 mg/dL   Comment 1 Notify RN    Comment 2 Document in Chart   Brain natriuretic peptide     Status: Abnormal   Collection Time: 07/10/15  1:05 PM  Result Value Ref Range   B Natriuretic Peptide 436.5 (H) 0.0 - 100.0 pg/mL  Procalcitonin - Baseline     Status: None   Collection Time: 07/10/15  1:05 PM  Result Value Ref Range   Procalcitonin 0.25 ng/mL    Comment:  Interpretation: PCT (Procalcitonin) <= 0.5 ng/mL: Systemic infection (sepsis) is not likely. Local bacterial infection is possible. (NOTE)         ICU PCT Algorithm               Non ICU PCT Algorithm    ----------------------------     ------------------------------         PCT < 0.25 ng/mL                 PCT < 0.1 ng/mL     Stopping of antibiotics            Stopping of antibiotics       strongly encouraged.               strongly encouraged.    ----------------------------     ------------------------------       PCT level decrease by               PCT < 0.25 ng/mL       >= 80% from peak PCT       OR PCT 0.25 - 0.5 ng/mL          Stopping of antibiotics                                             encouraged.     Stopping of antibiotics           encouraged.    ----------------------------     ------------------------------       PCT level decrease by              PCT >= 0.25 ng/mL       < 80% from peak PCT        AND PCT >= 0.5 ng/mL            Continuin g  antibiotics                                              encouraged.       Continuing antibiotics            encouraged.    ----------------------------     ------------------------------     PCT level increase compared          PCT > 0.5 ng/mL         with peak PCT AND          PCT >= 0.5 ng/mL             Escalation of antibiotics                                          strongly encouraged.      Escalation of antibiotics        strongly encouraged.   Blood gas, arterial     Status: Abnormal   Collection Time: 07/10/15  2:22 PM  Result Value Ref Range   O2 Content 15.0 L/min   Delivery systems CONTINUOUS POSITIVE AIRWAY PRESSURE    Expiratory PAP 18.0     Comment: CORRECTED ON 12/28 AT 1448: PREVIOUSLY REPORTED AS 15.0   pH, Arterial  7.332 (L) 7.350 - 7.450   pCO2 arterial 42.8 35.0 - 45.0 mmHg   pO2, Arterial 55.3 (L) 80.0 - 100.0 mmHg   Bicarbonate 22.3 20.0 - 24.0 mEq/L   TCO2 23.6 0 - 100 mmol/L   Acid-base deficit 2.8 (H) 0.0 - 2.0 mmol/L   O2 Saturation 87.0 %   Patient temperature 97.4    Collection site LEFT RADIAL    Drawn by [id number]    Sample type ARTERIAL DRAW    Allens test (pass/fail) PASS PASS  MRSA PCR Screening     Status: None   Collection Time: 07/10/15  4:20 PM  Result Value Ref Range   MRSA by PCR NEGATIVE NEGATIVE    Comment:        The GeneXpert MRSA Assay (FDA approved for NASAL specimens only), is one component of a comprehensive MRSA colonization surveillance program. It is not intended to diagnose MRSA infection nor to guide or monitor treatment for MRSA infections.   Glucose, capillary     Status: Abnormal   Collection Time: 07/10/15  4:22 PM  Result Value Ref Range   Glucose-Capillary 292 (H) 65 - 99 mg/dL   Comment 1 Capillary Specimen    Comment 2 Notify RN   Blood gas, arterial     Status: Abnormal   Collection Time: 07/10/15  6:40 PM  Result Value Ref Range   FIO2 1.00    Delivery systems VENTILATOR    Mode PRESSURE REGULATED  VOLUME CONTROL    VT 560.0 mL   LHR 16.0 resp/min   Peep/cpap 10.0 cm H20   pH, Arterial 7.317 (L) 7.350 - 7.450   pCO2 arterial 46.1 (H) 35.0 - 45.0 mmHg   pO2, Arterial 253 (H) 80.0 - 100.0 mmHg   Bicarbonate 22.9 20.0 - 24.0 mEq/L   TCO2 24.3 0 - 100 mmol/L   Acid-base deficit 2.3 (H) 0.0 - 2.0 mmol/L   O2 Saturation 99.9 %   Patient temperature 98.6    Collection site LEFT RADIAL    Drawn by [id number]    Sample type ARTERIAL DRAW    Allens test (pass/fail) PASS PASS  Glucose, capillary     Status: Abnormal   Collection Time: 07/10/15  7:16 PM  Result Value Ref Range   Glucose-Capillary 273 (H) 65 - 99 mg/dL   Comment 1 Capillary Specimen    Comment 2 Notify RN    Comment 3 Document in Chart   Glucose, capillary     Status: Abnormal   Collection Time: 07/10/15 11:34 PM  Result Value Ref Range   Glucose-Capillary 222 (H) 65 - 99 mg/dL   Comment 1 Capillary Specimen    Comment 2 Notify RN    Comment 3 Document in Chart   Triglycerides     Status: Abnormal   Collection Time: 07/11/15  3:21 AM  Result Value Ref Range   Triglycerides 385 (H) <150 mg/dL  Glucose, capillary     Status: Abnormal   Collection Time: 07/11/15  3:23 AM  Result Value Ref Range   Glucose-Capillary 215 (H) 65 - 99 mg/dL   Comment 1 Capillary Specimen    Comment 2 Notify RN    Comment 3 Document in Chart   Blood gas, arterial     Status: Abnormal   Collection Time: 07/11/15  3:50 AM  Result Value Ref Range   FIO2 0.60    Delivery systems VENTILATOR    Mode PRESSURE REGULATED VOLUME CONTROL    VT 560 mL  LHR 16 resp/min   Peep/cpap 8.0 cm H20   pH, Arterial 7.378 7.350 - 7.450   pCO2 arterial 41.4 35.0 - 45.0 mmHg   pO2, Arterial 128 (H) 80.0 - 100.0 mmHg   Bicarbonate 23.7 20.0 - 24.0 mEq/L   TCO2 24.9 0 - 100 mmol/L   Acid-base deficit 0.7 0.0 - 2.0 mmol/L   O2 Saturation 99.0 %   Patient temperature 99.4    Collection site RIGHT BRACHIAL    Drawn by [id number]    Sample type ARTERIAL DRAW     Allens test (pass/fail) PASS PASS  Renal function panel     Status: Abnormal   Collection Time: 07/11/15  5:20 AM  Result Value Ref Range   Sodium 137 135 - 145 mmol/L   Potassium 3.9 3.5 - 5.1 mmol/L   Chloride 105 101 - 111 mmol/L   CO2 24 22 - 32 mmol/L   Glucose, Bld 196 (H) 65 - 99 mg/dL   BUN 18 6 - 20 mg/dL   Creatinine, Ser 1.23 0.61 - 1.24 mg/dL   Calcium 8.6 (L) 8.9 - 10.3 mg/dL   Phosphorus 3.5 2.5 - 4.6 mg/dL   Albumin 3.1 (L) 3.5 - 5.0 g/dL   GFR calc non Af Amer >60 >60 mL/min   GFR calc Af Amer >60 >60 mL/min    Comment: (NOTE) The eGFR has been calculated using the CKD EPI equation. This calculation has not been validated in all clinical situations. eGFR's persistently <60 mL/min signify possible Chronic Kidney Disease.    Anion gap 8 5 - 15  CBC with Differential/Platelet     Status: None   Collection Time: 07/11/15  5:20 AM  Result Value Ref Range   WBC 9.0 4.0 - 10.5 K/uL    Comment: WHITE COUNT CONFIRMED ON SMEAR   RBC 4.32 4.22 - 5.81 MIL/uL   Hemoglobin 13.8 13.0 - 17.0 g/dL    Comment: REPEATED TO VERIFY   HCT 39.8 39.0 - 52.0 %   MCV 92.1 78.0 - 100.0 fL   MCH 31.9 26.0 - 34.0 pg   MCHC 34.7 30.0 - 36.0 g/dL   RDW 15.3 11.5 - 15.5 %   Platelets PLATELET CLUMPS NOTED ON SMEAR, UNABLE TO ESTIMATE 150 - 400 K/uL   Neutrophils Relative % 77 %   Lymphocytes Relative 11 %   Monocytes Relative 9 %   Eosinophils Relative 3 %   Basophils Relative 0 %   Neutro Abs 6.9 1.7 - 7.7 K/uL   Lymphs Abs 1.0 0.7 - 4.0 K/uL   Monocytes Absolute 0.8 0.1 - 1.0 K/uL   Eosinophils Absolute 0.3 0.0 - 0.7 K/uL   Basophils Absolute 0.0 0.0 - 0.1 K/uL  Magnesium     Status: None   Collection Time: 07/11/15  5:20 AM  Result Value Ref Range   Magnesium 1.7 1.7 - 2.4 mg/dL  Procalcitonin     Status: None   Collection Time: 07/11/15  5:20 AM  Result Value Ref Range   Procalcitonin 0.27 ng/mL    Comment:        Interpretation: PCT (Procalcitonin) <= 0.5  ng/mL: Systemic infection (sepsis) is not likely. Local bacterial infection is possible. (NOTE)         ICU PCT Algorithm               Non ICU PCT Algorithm    ----------------------------     ------------------------------         PCT < 0.25 ng/mL  PCT < 0.1 ng/mL     Stopping of antibiotics            Stopping of antibiotics       strongly encouraged.               strongly encouraged.    ----------------------------     ------------------------------       PCT level decrease by               PCT < 0.25 ng/mL       >= 80% from peak PCT       OR PCT 0.25 - 0.5 ng/mL          Stopping of antibiotics                                             encouraged.     Stopping of antibiotics           encouraged.    ----------------------------     ------------------------------       PCT level decrease by              PCT >= 0.25 ng/mL       < 80% from peak PCT        AND PCT >= 0.5 ng/mL            Continuin g antibiotics                                              encouraged.       Continuing antibiotics            encouraged.    ----------------------------     ------------------------------     PCT level increase compared          PCT > 0.5 ng/mL         with peak PCT AND          PCT >= 0.5 ng/mL             Escalation of antibiotics                                          strongly encouraged.      Escalation of antibiotics        strongly encouraged.   Glucose, capillary     Status: Abnormal   Collection Time: 07/11/15  7:36 AM  Result Value Ref Range   Glucose-Capillary 225 (H) 65 - 99 mg/dL  Troponin I     Status: None   Collection Time: 07/11/15 11:40 AM  Result Value Ref Range   Troponin I <0.03 <0.031 ng/mL    Comment:        NO INDICATION OF MYOCARDIAL INJURY.   Glucose, capillary     Status: Abnormal   Collection Time: 07/11/15 12:22 PM  Result Value Ref Range   Glucose-Capillary 243 (H) 65 - 99 mg/dL   Comment 1 Notify RN   Glucose, capillary      Status: Abnormal   Collection Time: 07/11/15  3:44 PM  Result Value Ref Range   Glucose-Capillary 257 (H) 65 - 99 mg/dL  Comment 1 Notify RN   Glucose, capillary     Status: Abnormal   Collection Time: 07/11/15  7:05 PM  Result Value Ref Range   Glucose-Capillary 203 (H) 65 - 99 mg/dL   Comment 1 Capillary Specimen   Glucose, capillary     Status: Abnormal   Collection Time: 07/11/15 11:39 PM  Result Value Ref Range   Glucose-Capillary 173 (H) 65 - 99 mg/dL   Comment 1 Capillary Specimen    Comment 2 Notify RN   Basic metabolic panel     Status: Abnormal   Collection Time: 07/12/15  2:51 AM  Result Value Ref Range   Sodium 140 135 - 145 mmol/L   Potassium 3.4 (L) 3.5 - 5.1 mmol/L   Chloride 100 (L) 101 - 111 mmol/L   CO2 30 22 - 32 mmol/L   Glucose, Bld 174 (H) 65 - 99 mg/dL   BUN 14 6 - 20 mg/dL   Creatinine, Ser 1.15 0.61 - 1.24 mg/dL   Calcium 9.0 8.9 - 10.3 mg/dL   GFR calc non Af Amer >60 >60 mL/min   GFR calc Af Amer >60 >60 mL/min    Comment: (NOTE) The eGFR has been calculated using the CKD EPI equation. This calculation has not been validated in all clinical situations. eGFR's persistently <60 mL/min signify possible Chronic Kidney Disease.    Anion gap 10 5 - 15  CBC with Differential/Platelet     Status: Abnormal   Collection Time: 07/12/15  2:51 AM  Result Value Ref Range   WBC 7.0 4.0 - 10.5 K/uL   RBC 4.15 (L) 4.22 - 5.81 MIL/uL   Hemoglobin 13.2 13.0 - 17.0 g/dL   HCT 38.1 (L) 39.0 - 52.0 %   MCV 91.8 78.0 - 100.0 fL   MCH 31.8 26.0 - 34.0 pg   MCHC 34.6 30.0 - 36.0 g/dL   RDW 14.8 11.5 - 15.5 %   Platelets 128 (L) 150 - 400 K/uL   Neutrophils Relative % 67 %   Neutro Abs 4.6 1.7 - 7.7 K/uL   Lymphocytes Relative 16 %   Lymphs Abs 1.1 0.7 - 4.0 K/uL   Monocytes Relative 10 %   Monocytes Absolute 0.7 0.1 - 1.0 K/uL   Eosinophils Relative 7 %   Eosinophils Absolute 0.5 0.0 - 0.7 K/uL   Basophils Relative 0 %   Basophils Absolute 0.0 0.0 - 0.1  K/uL  Glucose, capillary     Status: Abnormal   Collection Time: 07/12/15  4:19 AM  Result Value Ref Range   Glucose-Capillary 190 (H) 65 - 99 mg/dL  Glucose, capillary     Status: Abnormal   Collection Time: 07/12/15  7:29 AM  Result Value Ref Range   Glucose-Capillary 176 (H) 65 - 99 mg/dL   Comment 1 Capillary Specimen    Comment 2 Notify RN     Imaging / Studies: Dg Chest Port 1 View  07/10/2015  CLINICAL DATA:  Status post intubation EXAM: PORTABLE CHEST - 1 VIEW COMPARISON:  07/10/2015 FINDINGS: Cardiac shadow is stable. Pulmonary edema is again identified. An endotracheal tube is been placed at 2.3 cm above the carina. No focal confluent infiltrate is seen. The overall inspiratory effort is poor. IMPRESSION: Stable pulmonary edema. Poor inspiratory effort. Endotracheal tube in satisfactory position. Electronically Signed   By: Inez Catalina M.D.   On: 07/10/2015 15:27   Dg Chest Port 1 View  07/10/2015  CLINICAL DATA:  Pulmonary edema.  Acute appendicitis. EXAM:  PORTABLE CHEST 1 VIEW COMPARISON:  04/08/2015 FINDINGS: The patient has developed diffuse interstitial accentuation consistent with pulmonary edema. Heart size and pulmonary vascularity are normal. No acute osseous abnormality. IMPRESSION: Diffuse slight interstitial pulmonary edema. Electronically Signed   By: Lorriane Shire M.D.   On: 07/10/2015 13:11    Medications / Allergies:  Scheduled Meds: . cefTRIAXone (ROCEPHIN)  IV  2 g Intravenous Q24H  . citalopram  40 mg Oral Daily  . enoxaparin (LOVENOX) injection  40 mg Subcutaneous Q24H  . insulin aspart  0-15 Units Subcutaneous 6 times per day  . insulin glargine  20 Units Subcutaneous Daily  . metoprolol  5 mg Intravenous 4 times per day  . metronidazole  500 mg Intravenous Q8H  . pantoprazole (PROTONIX) IV  40 mg Intravenous QHS  . potassium chloride  40 mEq Oral Once   Continuous Infusions: . sodium chloride 10 mL/hr at 07/11/15 0038   PRN Meds:.fentaNYL  (SUBLIMAZE) injection, fentaNYL (SUBLIMAZE) injection, hydrALAZINE, nitroGLYCERIN, oxyCODONE-acetaminophen  Antibiotics: Anti-infectives    Start     Dose/Rate Route Frequency Ordered Stop   07/11/15 0800  cefTRIAXone (ROCEPHIN) 2 g in dextrose 5 % 50 mL IVPB     2 g 100 mL/hr over 30 Minutes Intravenous Every 24 hours 07/10/15 1615     07/11/15 0400  metroNIDAZOLE (FLAGYL) IVPB 500 mg     500 mg 100 mL/hr over 60 Minutes Intravenous Every 8 hours 07/10/15 1907     07/10/15 1630  metroNIDAZOLE (FLAGYL) IVPB 500 mg  Status:  Discontinued     500 mg 100 mL/hr over 60 Minutes Intravenous Every 8 hours 07/10/15 1615 07/10/15 1907   07/10/15 0830  [MAR Hold]  cefTRIAXone (ROCEPHIN) 2 g in dextrose 5 % 50 mL IVPB  Status:  Discontinued     (MAR Hold since 07/10/15 0931)   2 g 100 mL/hr over 30 Minutes Intravenous Every 24 hours 07/10/15 0826 07/10/15 1615   07/10/15 0830  [MAR Hold]  metroNIDAZOLE (FLAGYL) IVPB 500 mg  Status:  Discontinued     (MAR Hold since 07/10/15 0931)   500 mg 100 mL/hr over 60 Minutes Intravenous Every 8 hours 07/10/15 0826 07/10/15 1615   07/10/15 0745  cefTRIAXone (ROCEPHIN) 2 g in dextrose 5 % 50 mL IVPB     2 g 100 mL/hr over 30 Minutes Intravenous  Once 07/10/15 0737 07/10/15 0838   07/10/15 0745  metroNIDAZOLE (FLAGYL) IVPB 500 mg     500 mg 100 mL/hr over 60 Minutes Intravenous  Once 07/10/15 2694 07/10/15 8546         Assessment/Plan Acute appendicitis POD#2 laparoscopic appendectomy---Dr. Donne Hazel -stable, advance diet, mobilize  Acute respiratory failure-extubated 12/29.  Pulling 1745m on IS. ID-recephin/flagyl 2/5D VTE prophylaxis-SCD, add lovenox DM II-CBGs/SSI CAD-hx CHF, CAD, MI s/p stent, aorto-bifem bypass, HTN FEN-fulls, add PO pain meds.  Hypokalemia-464m kcl now, AM labs.  Dispo-floor if okay with CCM   [facility] Pager [phone] consults and floor pages call  [phone](7A-4:30P)  07/12/2015 8:39 AM

## 2015-07-12 NOTE — Discharge Instructions (Signed)
LAPAROSCOPIC SURGERY: POST OP INSTRUCTIONS  1. DIET: Follow a [place] bland diet the first 24 hours after arrival home, such as soup, liquids, crackers, etc.  Be sure to include lots of fluids daily.  Avoid fast food or heavy meals as your are more likely to get nauseated.  Eat a low fat the next few days after surgery.   2. Take your usually prescribed home medications unless otherwise directed. 3. PAIN CONTROL: a. Pain is best controlled by a usual combination of three different methods TOGETHER: i. Ice/Heat ii. Over the counter pain medication iii. Prescription pain medication b. Most patients will experience some swelling and bruising around the incisions.  Ice packs or heating pads (30-60 minutes up to 6 times a day) will help. Use ice for the first few days to help decrease swelling and bruising, then switch to heat to help relax tight/sore spots and speed recovery.  Some people prefer to use ice alone, heat alone, alternating between ice & heat.  Experiment to what works for you.  Swelling and bruising can take several weeks to resolve.   c. It is helpful to take an over-the-counter pain medication regularly for the first few weeks.  Choose one of the following that works best for you: i. Naproxen (Aleve, etc)  Two 227m tabs twice a day ii. Ibuprofen (Advil, etc) Three 2043mtabs four times a day (every meal & bedtime) iii. Acetaminophen (Tylenol, etc) 500-65057mour times a day (every meal & bedtime) d. A  prescription for pain medication (such as oxycodone, hydrocodone, etc) should be given to you upon discharge.  Take your pain medication as prescribed.  i. If you are having problems/concerns with the prescription medicine (does not control pain, nausea, vomiting, rash, itching, etc), please call us [phone] see if we need to switch you to a different pain medicine that will work better for you and/or control your side effect better. ii. If you need a refill on your pain medication,  please contact your pharmacy.  They will contact our office to request authorization. Prescriptions will not be filled after 5 pm or on week-ends. 4. Avoid getting constipated.  Between the surgery and the pain medications, it is common to experience some constipation.  Increasing fluid intake and taking a fiber supplement (such as Metamucil, Citrucel, FiberCon, MiraLax, etc) 1-2 times a day regularly will usually help prevent this problem from occurring.  A mild laxative (prune juice, Milk of Magnesia, MiraLax, etc) should be taken according to package directions if there are no bowel movements after 48 hours.   5. Watch out for diarrhea.  If you have many loose bowel movements, simplify your diet to bland foods & liquids for a few days.  Stop any stool softeners and decrease your fiber supplement.  Switching to mild anti-diarrheal medications (Kayopectate, Pepto Bismol) can help.  If this worsens or does not improve, please call us.[place]. Wash / shower every day.  You may shower over the dressings as they are waterproof.  Continue to shower over incision(s) after the dressing is off. 7. Remove your waterproof bandages 5 days after surgery.  You may leave the incision open to air.  You may replace a dressing/Band-Aid to cover the incision for comfort if you wish.  8. ACTIVITIES as tolerated:   a. You may resume regular ([place]) daily activities beginning the next day--such as daily self-care, walking, climbing stairs--gradually increasing activities as tolerated.  If you can walk 30 minutes without difficulty, it  is safe to try more intense activity such as jogging, treadmill, bicycling, low-impact aerobics, swimming, etc. b. Save the most intensive and strenuous activity for last such as sit-ups, heavy lifting, contact sports, etc  Refrain from any heavy lifting or straining until you are off narcotics for pain control.   c. DO NOT PUSH THROUGH PAIN.  Let pain be your guide: If it hurts to do something, don't  do it.  Pain is your body warning you to avoid that activity for another week until the pain goes down. d. You may drive when you are no longer taking prescription pain medication, you can comfortably wear a seatbelt, and you can safely maneuver your car and apply brakes. e. Dennis Bast may have sexual intercourse when it is comfortable.  9. FOLLOW UP in our office a. Please call [facility] at [phone] to set up an appointment to see your surgeon in the office for a follow-up appointment approximately 2-3 weeks after your surgery. b. Make sure that you call for this appointment the day you arrive home to insure a convenient appointment time. 10. IF YOU HAVE DISABILITY OR FAMILY LEAVE FORMS, BRING THEM TO THE OFFICE FOR PROCESSING.  DO NOT GIVE THEM TO YOUR DOCTOR.   WHEN TO CALL us [phone]: 1. Poor pain control 2. Reactions / problems with new medications (rash/itching, nausea, etc)  3. Fever over 101.5 F (38.5 C) 4. Inability to urinate 5. Nausea and/or vomiting 6. Worsening swelling or bruising 7. Continued bleeding from incision. 8. Increased pain, redness, or drainage from the incision   The clinic staff is available to answer your questions during regular business hours (8:30am-5pm).  Please dont hesitate to call and ask to speak to one of our nurses for clinical concerns.   If you have a medical emergency, go to the nearest emergency room or call 911.  A surgeon from [facility] is always on call at the [facility], [place], [place], [place], [place]  [address] ? MAIN: [phone] ? TOLL FREE: [phone] ?  FAX [phone] [email or web address]

## 2015-07-12 NOTE — Plan of Care (Signed)
Problem: Activity: Goal: Ability to tolerate increased activity will improve Outcome: Progressing Pt OOB today Goal: Mobility will improve Outcome: Progressing See previous  Problem: Bowel/Gastric: Goal: Gastrointestinal status for postoperative course will improve Outcome: Progressing Audible bowel sounds. Pt reports passing gas.  Problem: Nutrition: Goal: Ability to attain and maintain optimal nutritional status will improve Outcome: Progressing Pt on clear liquid diet per [facility]  Problem: Pain Management: Goal: Pain level will decrease Outcome: Not Progressing Pt requiring frequent PRN pain medication.

## 2015-07-12 NOTE — Consult Note (Addendum)
Consult note  Requesting physician Dr. Rolm Bookbinder  Reason for consult Management of diabetes mellitus     HPI: 54 year old male who  has a past medical history of Heart attack ([place]); CHF (congestive heart failure) ([place]); Neuropathy ([place]); COPD (chronic obstructive pulmonary disease) ([place]); Diabetes mellitus with neuropathy ([place]); Temporal giant cell arteritis ([place]); Depression; OSA (obstructive sleep apnea); Peripheral vascular disease due to secondary diabetes mellitus ([place]); Cirrhosis ([place]); TIA (transient ischemic attack) (April 2016); and Splenomegaly. Patient presented to the hospital with abdominal pain found to have appendicitis and underwent laparoscopic appendectomy on 07/10/2015. He has a history of diabetes mellitus and takes Novolin 70/30, 24 units in the morning and 20 units in the evening. Patient's blood sugar has been consistently elevated around 200s in the hospital. [facility] service consulted for management of diabetes mellitus. Patient currently is postop, he also had developed acute respiratory failure from volume overload, and currently extubated. He denies any complaints at this time. No chest pain or shortness of breath. No nausea vomiting or diarrhea. Patient started on full liquid diet.  Allergies:   Allergies  Allergen Reactions  . Ace Inhibitors Other (See Comments)    Pt reports ace inhibitors make him cough      Past Medical History  Diagnosis Date  . Heart attack ([place])   . CHF (congestive heart failure) (HCC)     diastolic (echo at [facility] 0737) EF 55-60%  . Neuropathy ([place])   . COPD (chronic obstructive pulmonary disease) ([place])   . Diabetes mellitus with neuropathy ([place])   . Temporal giant cell arteritis ([place])   . Depression   . OSA (obstructive sleep apnea)   . Peripheral vascular disease due to secondary diabetes mellitus ([place])   . Cirrhosis ([place])     NASH per records  . TIA (transient ischemic attack) April 2016  .  Splenomegaly     Past Surgical History  Procedure Laterality Date  . Cardiac stents  July 2015   . Cardiac catheterization    . 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 Laparoscopic appendectomy N/A 07/10/2015    Procedure: APPENDECTOMY LAPAROSCOPIC;  Surgeon: Rolm Bookbinder, MD;  Location: [facility];  Service: General;  Laterality: N/A;    Prior to Admission medications   Medication Sig Start Date End Date Taking? Authorizing Provider  atorvastatin (LIPITOR) 20 MG tablet Take 20 mg by mouth daily.   Yes Historical Provider, MD  citalopram (CELEXA) 40 MG tablet Take 40 mg by mouth daily.   Yes Historical Provider, MD  furosemide (LASIX) 20 MG tablet Take 20 mg by mouth daily.   Yes Historical Provider, MD  gabapentin (NEURONTIN) 300 MG capsule Take 900 mg by mouth 3 (three) times daily.   Yes Historical Provider, MD  hydrochlorothiazide (HYDRODIURIL) 25 MG tablet Take 25 mg by mouth daily as needed (only when taking tramadol).   Yes Historical Provider, MD  insulin NPH-regular Human (NOVOLIN 70/30) (70-30) 100 UNIT/ML injection Inject 20-24 Units into the skin 2 (two) times daily. Take 24units in the morning and 20units in the evening.   Yes Historical Provider, MD  metoprolol (LOPRESSOR) 50 MG tablet Take 50 mg by mouth 2 (two) times daily.   Yes Historical Provider, MD  nitroGLYCERIN (NITROSTAT) 0.4 MG SL tablet Place 0.4 mg under the tongue every 5 (five) minutes as needed for chest pain.   Yes Historical Provider, MD  prednisoLONE acetate (PRED FORTE) 1 % ophthalmic suspension Place 1 drop into the  left eye 4 (four) times daily.   Yes Historical Provider, MD  traMADol (ULTRAM) 50 MG tablet Take 1 tablet (50 mg total) by mouth every 6 (six) hours as needed. Patient taking differently: Take 50 mg by mouth every 6 (six) hours as needed for moderate pain.  05/14/15  Yes Loney Hering, MD    Social History:  reports that he has quit smoking. He does not have any smokeless  tobacco history on file. He reports that he does not drink alcohol or use illicit drugs.  Family History  Problem Relation Age of Onset  . Heart attack Mother 31  . Stroke Mother   . Heart attack Father   . Alzheimer's disease Father     Danley Danker Weights   07/09/15 2328 07/12/15 0600  Weight: 97.977 kg (216 lb) 92.08 kg (203 lb)    All the positives are listed in BOLD  Review of Systems:  HEENT: Headache, blurred vision, runny nose, sore throat Neck: Hypothyroidism, hyperthyroidism,,lymphadenopathy Chest : Shortness of breath, history of COPD, Asthma Heart : Chest pain, history of coronary arterey disease GI:  Nausea, vomiting, diarrhea, constipation, GERD, abdominal pain GU: Dysuria, urgency, frequency of urination, hematuria Neuro: Stroke, seizures, syncope Psych: Depression, anxiety, hallucinations   Physical Exam: Blood pressure 137/70, pulse 84, temperature 97.8 F (36.6 C), temperature source Oral, resp. rate 16, height 5' 9"  (1.753 m), weight 92.08 kg (203 lb), SpO2 91 %. Constitutional:   Patient is a well-developed and well-nourished male* in no acute distress and cooperative with exam. Head: Normocephalic and atraumatic Mouth: Mucus membranes moist Eyes: PERRL, EOMI, conjunctivae normal Neck: Supple, No Thyromegaly Cardiovascular: RRR, S1 normal, S2 normal Pulmonary/Chest: CTAB, no wheezes, rales, or rhonchi Abdominal: Soft. Non-tender, non-distended, bowel sounds are normal, no masses, organomegaly, or guarding present.  Neurological: A&O x3, Strength is normal and symmetric bilaterally, cranial nerve II-XII are grossly intact, no focal motor deficit, sensory intact to Shivley touch bilaterally.  Extremities : No Cyanosis, Clubbing or Edema  Labs on Admission:  Basic Metabolic Panel:  Recent Labs Lab 07/09/15 2354 07/11/15 0520 07/12/15 0251  NA 135 137 140  K 4.2 3.9 3.4*  CL 98* 105 100*  CO2 23 24 30   GLUCOSE 311* 196* 174*  BUN 19 18 14   CREATININE  1.19 1.23 1.15  CALCIUM 10.0 8.6* 9.0  MG  --  1.7  --   PHOS  --  3.5  --    Liver Function Tests:  Recent Labs Lab 07/09/15 2354 07/11/15 0520  AST 31  --   ALT 47  --   ALKPHOS 105  --   BILITOT 1.6*  --   PROT 7.6  --   ALBUMIN 4.1 3.1*    Recent Labs Lab 07/09/15 2354  LIPASE 55*   No results for input(s): AMMONIA in the last 168 hours. CBC:  Recent Labs Lab 07/09/15 2354 07/11/15 0520 07/12/15 0251  WBC 12.1* 9.0 7.0  NEUTROABS  --  6.9 4.6  HGB 16.1 13.8 13.2  HCT 45.0 39.8 38.1*  MCV 91.5 92.1 91.8  PLT 167 PLATELET CLUMPS NOTED ON SMEAR, UNABLE TO ESTIMATE 128*   Cardiac Enzymes:  Recent Labs Lab 07/11/15 1140  TROPONINI <0.03    BNP (last 3 results)  Recent Labs  07/10/15 1305  BNP 436.5*     CBG:  Recent Labs Lab 07/11/15 1544 07/11/15 1905 07/11/15 2339 07/12/15 0419 07/12/15 0729  GLUCAP 257* 203* 173* 190* 176*    Radiological Exams on  Admission: Dg Chest Port 1 View  07/10/2015  CLINICAL DATA:  Status post intubation EXAM: PORTABLE CHEST - 1 VIEW COMPARISON:  07/10/2015 FINDINGS: Cardiac shadow is stable. Pulmonary edema is again identified. An endotracheal tube is been placed at 2.3 cm above the carina. No focal confluent infiltrate is seen. The overall inspiratory effort is poor. IMPRESSION: Stable pulmonary edema. Poor inspiratory effort. Endotracheal tube in satisfactory position. Electronically Signed   By: Inez Catalina M.D.   On: 07/10/2015 15:27   Dg Chest Port 1 View  07/10/2015  CLINICAL DATA:  Pulmonary edema.  Acute appendicitis. EXAM: PORTABLE CHEST 1 VIEW COMPARISON:  04/08/2015 FINDINGS: The patient has developed diffuse interstitial accentuation consistent with pulmonary edema. Heart size and pulmonary vascularity are normal. No acute osseous abnormality. IMPRESSION: Diffuse slight interstitial pulmonary edema. Electronically Signed   By: Lorriane Shire M.D.   On: 07/10/2015 13:11        Assessment/Plan Active Problems:   Acute appendicitis   Appendicitis, acute   Diabetes mellitus with complication (HCC)   Chronic congestive heart failure with left ventricular diastolic dysfunction (HCC)   Lactic acidosis   S/P laparoscopic appendectomy   Acute respiratory failure with hypoxia (HCC)  Diabetes mellitus Patient has been started on sliding-scale insulin with NovoLog, along with Lantus 20 units subcutaneous  Daily. Will continue with the same regimen, and follow blood glucoses in the hospital. Once patient is stable for discharge he can be started back on his home regimen off Novolin 70/30, 24 units in a.m. and 20 units in p.m.  Status post laparoscopic appendectomy Followed by general surgery.  Acute respiratory failure, resolved Patient is status post extubation, no shortness of breath at this time.   [facility] Pager: [phone] 07/12/2015, 10:31 AM  If 7PM-7AM, please contact night-coverage  [email or web address]  Password [email or web address]

## 2015-07-12 NOTE — Evaluation (Signed)
Physical Therapy Evaluation Patient Details Name: Larry Terrell MRN: [id number] DOB: 12/15/1960 Today's Date: 07/12/2015   History of Present Illness  54 year old male who  has a past medical history of Heart attack ([place]); CHF (congestive heart failure) ([place]); Neuropathy ([place]); COPD (chronic obstructive pulmonary disease) ([place]); Diabetes mellitus with neuropathy ([place]); Temporal giant cell arteritis ([place]); Depression; OSA (obstructive sleep apnea); Peripheral vascular disease due to secondary diabetes mellitus ([place]); Cirrhosis ([place]); TIA (transient ischemic attack) (April 2016); and Splenomegaly.Patient presented to the hospital with abdominal pain found to have appendicitis and underwent laparoscopic appendectomy on 07/10/2015.  Clinical Impression  Pt admitted with above and tolerating mobility well for first time up and walking. Pt does have 24/7 assist for another week but will have to be safe mod I function for safe transition home since he'll be home alone during the day. He does report he may have to other family that can help him out as well.    Follow Up Recommendations Home health PT;Supervision/Assistance - 24 hour (may progress well enough to not need HHPT)    Equipment Recommendations  Rolling walker with 5" wheels (if patient doesnt have one)    Recommendations for Other Services       Precautions / Restrictions Precautions Precautions: Fall Restrictions Weight Bearing Restrictions: No      Mobility  Bed Mobility               General bed mobility comments: pt up in chair upon PT arrival, discussed log roll technique  Transfers Overall transfer level: Needs assistance Equipment used: Rolling walker (2 wheeled) Transfers: Sit to/from Stand Sit to Stand: Min assist         General transfer comment: v/c's to push  up from arm rests  Ambulation/Gait Ambulation/Gait assistance: Min guard Ambulation Distance (Feet): 175 Feet Assistive device: Rolling walker  (2 wheeled) Gait Pattern/deviations: Step-through pattern;Decreased stride length;Wide base of support Gait velocity: decreased Gait velocity interpretation: Below normal speed for age/gender General Gait Details: decreased step height, mild SOB, SpO2 at 97% on RA. 2 standing rest breaks  Stairs            Wheelchair Mobility    Modified Rankin (Stroke Patients Only)       Balance Overall balance assessment: Needs assistance         Standing balance support: Bilateral upper extremity supported Standing balance-Leahy Scale: Poor                               Pertinent Vitals/Pain Pain Assessment: 0-10 Pain Score: 5  Pain Location: penis from removal of catheter    Home Living Family/patient expects to be discharged to:: Private residence Living Arrangements: Spouse/significant other Available Help at Discharge: Family;Available 24 hours/day (for another week) Type of Home: House Home Access: Stairs to enter Entrance Stairs-Rails: Right Entrance Stairs-Number of Steps: 3 Home Layout: One level Home Equipment: Walker - 2 wheels;Grab bars - tub/shower;Grab bars - toilet (unsure)      Prior Function Level of Independence: Independent               Hand Dominance   Dominant Hand: Right    Extremity/Trunk Assessment   Upper Extremity Assessment: Overall WFL for tasks assessed           Lower Extremity Assessment: Generalized weakness      Cervical / Trunk Assessment:  (lap chole)  Communication   Communication: Expressive difficulties  Cognition Arousal/Alertness: Awake/alert Behavior During Therapy: WFL for tasks assessed/performed Overall Cognitive Status: Within Functional Limits for tasks assessed                      General Comments      Exercises        Assessment/Plan    PT Assessment Patient needs continued PT services  PT Diagnosis Difficulty walking;Acute pain   PT Problem List Decreased  strength;Decreased range of motion;Decreased balance;Decreased activity tolerance;Decreased mobility  PT Treatment Interventions DME instruction;Gait training;Stair training;Functional mobility training;Therapeutic activities;Therapeutic exercise   PT Goals (Current goals can be found in the Care Plan section) Acute Rehab PT Goals Patient Stated Goal: home soon PT Goal Formulation: With patient Time For Goal Achievement: 07/19/15 Potential to Achieve Goals: Good    Frequency Min 3X/week   Barriers to discharge        Co-evaluation               End of Session Equipment Utilized During Treatment: Gait belt Activity Tolerance: Patient tolerated treatment well Patient left: in chair;with call bell/phone within reach;with nursing/sitter in room Nurse Communication: Mobility status         Time: 9914-4458 PT Time Calculation (min) (ACUTE ONLY): 19 min   Charges:   PT Evaluation $Initial PT Evaluation Tier I: 1 Procedure     PT G CodesKingsley Callander 07/12/2015, 1:56 PM   Kittie Plater, PT, DPT Pager #: [phone] Office #: [phone]

## 2015-07-12 NOTE — Plan of Care (Signed)
Problem: Activity: Goal: Ability to tolerate increased activity will improve Outcome: Progressing Ambulated in hall 100 ft

## 2015-07-13 DIAGNOSIS — Z9049 Acquired absence of other specified parts of digestive tract: Secondary | ICD-10-CM

## 2015-07-13 DIAGNOSIS — I10 Essential (primary) hypertension: Secondary | ICD-10-CM

## 2015-07-13 DIAGNOSIS — K219 Gastro-esophageal reflux disease without esophagitis: Secondary | ICD-10-CM

## 2015-07-13 LAB — CBC
HEMATOCRIT: 36.6 % — AB (ref 39.0–52.0)
HEMOGLOBIN: 12.5 g/dL — AB (ref 13.0–17.0)
MCH: 31.3 pg (ref 26.0–34.0)
MCHC: 34.2 g/dL (ref 30.0–36.0)
MCV: 91.5 fL (ref 78.0–100.0)
Platelets: 126 10*3/uL — ABNORMAL LOW (ref 150–400)
RBC: 4 MIL/uL — ABNORMAL LOW (ref 4.22–5.81)
RDW: 14.7 % (ref 11.5–15.5)
WBC: 5 10*3/uL (ref 4.0–10.5)

## 2015-07-13 LAB — GLUCOSE, CAPILLARY
GLUCOSE-CAPILLARY: 123 mg/dL — AB (ref 65–99)
GLUCOSE-CAPILLARY: 165 mg/dL — AB (ref 65–99)
Glucose-Capillary: 280 mg/dL — ABNORMAL HIGH (ref 65–99)
Glucose-Capillary: 190 mg/dL — ABNORMAL HIGH (ref 65–99)
Glucose-Capillary: 216 mg/dL — ABNORMAL HIGH (ref 65–99)

## 2015-07-13 LAB — BASIC METABOLIC PANEL
ANION GAP: 8 (ref 5–15)
BUN: 14 mg/dL (ref 6–20)
CHLORIDE: 101 mmol/L (ref 101–111)
CO2: 29 mmol/L (ref 22–32)
Calcium: 9 mg/dL (ref 8.9–10.3)
Creatinine, Ser: 1.05 mg/dL (ref 0.61–1.24)
GFR calc non Af Amer: >60 mL/min (ref >60)
Glucose, Bld: 146 mg/dL — ABNORMAL HIGH (ref 65–99)
POTASSIUM: 3.4 mmol/L — AB (ref 3.5–5.1)
Sodium: 138 mmol/L (ref 135–145)

## 2015-07-13 MED ORDER — INSULIN ASPART 100 UNIT/ML ~~LOC~~ SOLN
0.0000 [IU] | Freq: Three times a day (TID) | SUBCUTANEOUS | Status: DC
  Administered 2015-07-14: 3 [IU] via SUBCUTANEOUS

## 2015-07-13 MED ORDER — METRONIDAZOLE 500 MG PO TABS
500.0000 mg | ORAL_TABLET | Freq: Three times a day (TID) | ORAL | Status: DC
  Administered 2015-07-13 – 2015-07-14 (×3): 500 mg via ORAL
  Filled 2015-07-13 (×3): qty 1

## 2015-07-13 MED ORDER — INSULIN GLARGINE 100 UNIT/ML ~~LOC~~ SOLN
22.0000 [IU] | Freq: Every day | SUBCUTANEOUS | Status: DC
  Administered 2015-07-14: 22 [IU] via SUBCUTANEOUS
  Filled 2015-07-13: qty 0.22

## 2015-07-13 MED ORDER — POTASSIUM CHLORIDE CRYS ER 20 MEQ PO TBCR
40.0000 meq | EXTENDED_RELEASE_TABLET | Freq: Two times a day (BID) | ORAL | Status: AC
  Administered 2015-07-13 (×2): 40 meq via ORAL
  Filled 2015-07-13 (×2): qty 2

## 2015-07-13 MED ORDER — PANTOPRAZOLE SODIUM 40 MG PO TBEC
40.0000 mg | DELAYED_RELEASE_TABLET | Freq: Every day | ORAL | Status: DC
  Administered 2015-07-13: 40 mg via ORAL
  Filled 2015-07-13: qty 1

## 2015-07-13 NOTE — Progress Notes (Signed)
[facility] PROGRESS NOTE  Larry Terrell WUJ:[id number] DOB: [birth year] DOA: 07/10/2015 PCP: No primary care provider on file.  Assessment/Plan: 1-s/p laparoscopic appendectomy  -management per surgery -positive BS on exam and tolerating diet -no BM yet  2-VDRF/acute resp failure: -denies SOB -extubated on 12/29 and since then doing ok -continue lung toiletry and IS  3-HTN: -BP stable -continue current antihypertensive regimen  4-diabetes mellitus type 2: with long term insulin -much better now -will adjust lantus to 22 units daily and continue SSI moderate -at discharge resume home regimen of 70/30  5-depression: continue celexa  6-diabetic neuropathy: continue Neurontin  7-GERD: continue PPI  Code Status: Full Family Communication: no family at bedside  Disposition Plan: Lantus 22 units subcutaneous daily, along with SSI moderate. Once patient is stable for discharge he can be started back on his home regimen off Novolin 70/30, 24 units in a.m. and 20 units in p.m.; follow up with PCP for further adjustments. OK for discharge when ready from [facility] standpoint. Will sign off now; please call with questions.  Procedures: APPENDECTOMY LAPAROSCOPIC  Antibiotics:  None   HPI/Subjective: Afebrile, reports no BM; no nausea or vomiting. CBG's much better controlled.  Objective: Filed Vitals:   07/13/15 0444 07/13/15 0542  BP: 135/65 116/74  Pulse: 68 71  Temp: 97.9 F (36.6 C) 97.9 F (36.6 C)  Resp: 18 17    Intake/Output Summary (Last 24 hours) at 07/13/15 1132 Last data filed at 07/13/15 1050  Gross per 24 hour  Intake 1551.83 ml  Output   1900 ml  Net -348.17 ml   Filed Weights   07/09/15 2328 07/12/15 0600  Weight: 97.977 kg (216 lb) 92.08 kg (203 lb)    Exam:   General:  No CP, no SOB, feeling better and tolerating diet; afebrile  Cardiovascular: S1 and S2, no rubs or gallops  Respiratory: CTA bilaterally  Abdomen: NT, soft, positive  BS  Musculoskeletal: no edema or cyanosis   Data Reviewed: Basic Metabolic Panel:  Recent Labs Lab 07/09/15 2354 07/11/15 0520 07/12/15 0251 07/13/15 0510  NA 135 137 140 138  K 4.2 3.9 3.4* 3.4*  CL 98* 105 100* 101  CO2 23 24 30 29   GLUCOSE 311* 196* 174* 146*  BUN 19 18 14 14   CREATININE 1.19 1.23 1.15 1.05  CALCIUM 10.0 8.6* 9.0 9.0  MG  --  1.7  --   --   PHOS  --  3.5  --   --    Liver Function Tests:  Recent Labs Lab 07/09/15 2354 07/11/15 0520  AST 31  --   ALT 47  --   ALKPHOS 105  --   BILITOT 1.6*  --   PROT 7.6  --   ALBUMIN 4.1 3.1*    Recent Labs Lab 07/09/15 2354  LIPASE 55*   CBC:  Recent Labs Lab 07/09/15 2354 07/11/15 0520 07/12/15 0251 07/13/15 0510  WBC 12.1* 9.0 7.0 5.0  NEUTROABS  --  6.9 4.6  --   HGB 16.1 13.8 13.2 12.5*  HCT 45.0 39.8 38.1* 36.6*  MCV 91.5 92.1 91.8 91.5  PLT 167 PLATELET CLUMPS NOTED ON SMEAR, UNABLE TO ESTIMATE 128* 126*   Cardiac Enzymes:  Recent Labs Lab 07/11/15 1140  TROPONINI <0.03   BNP (last 3 results)  Recent Labs  07/10/15 1305  BNP 436.5*   CBG:  Recent Labs Lab 07/12/15 1634 07/12/15 1929 07/12/15 2334 07/13/15 0340 07/13/15 0732  GLUCAP 182* 152* 212* 123* 165*  Recent Results (from the past 240 hour(s))  Blood Culture (routine x 2)     Status: None (Preliminary result)   Collection Time: 07/10/15  8:00 AM  Result Value Ref Range Status   Specimen Description BLOOD LEFT HAND  Final   Special Requests   Final    BOTTLES DRAWN AEROBIC AND ANAEROBIC 5CC ANA 10CC AER   Culture NO GROWTH 3 DAYS  Final   Report Status PENDING  Incomplete  Blood Culture (routine x 2)     Status: None (Preliminary result)   Collection Time: 07/10/15  8:10 AM  Result Value Ref Range Status   Specimen Description BLOOD RIGHT HAND  Final   Special Requests BOTTLES DRAWN AEROBIC AND ANAEROBIC 5CC  Final   Culture NO GROWTH 3 DAYS  Final   Report Status PENDING  Incomplete  MRSA PCR  Screening     Status: None   Collection Time: 07/10/15  4:20 PM  Result Value Ref Range Status   MRSA by PCR NEGATIVE NEGATIVE Final    Comment:        The GeneXpert MRSA Assay (FDA approved for NASAL specimens only), is one component of a comprehensive MRSA colonization surveillance program. It is not intended to diagnose MRSA infection nor to guide or monitor treatment for MRSA infections.      Studies: No results found.  Scheduled Meds: . cefTRIAXone (ROCEPHIN)  IV  2 g Intravenous Q24H  . citalopram  40 mg Oral Daily  . enoxaparin (LOVENOX) injection  40 mg Subcutaneous Q24H  . furosemide  20 mg Oral Daily  . gabapentin  900 mg Oral TID  . insulin aspart  0-15 Units Subcutaneous 6 times per day  . insulin glargine  20 Units Subcutaneous Daily  . metoprolol  50 mg Oral BID  . metroNIDAZOLE  500 mg Oral 3 times per day  . pantoprazole  40 mg Oral Q1200  . potassium chloride  40 mEq Oral BID  . prednisoLONE acetate  1 drop Left Eye QID   Continuous Infusions: . sodium chloride 10 mL/hr at 07/11/15 0038    Active Problems:   Acute appendicitis   Appendicitis, acute   Diabetes mellitus with complication (HCC)   Chronic congestive heart failure with left ventricular diastolic dysfunction (HCC)   Lactic acidosis   S/P laparoscopic appendectomy   Acute respiratory failure with hypoxia ([place])    Time spent: 30 minutes    Barton Dubois  [facility] Pager [phone]. If 7PM-7AM, please contact night-coverage at [email or web address], password [facility] 07/13/2015, 11:32 AM  LOS: 3 days

## 2015-07-13 NOTE — Progress Notes (Signed)
3 Days Post-Op  Subjective: Feeling better.  Tolerating liquid diet.  No nausea.  Passing flatus but no stool.  Voiding without difficulty.  Ambulating. Transferred to floor yesterday excised now being followed by family medicine, which is greatly appreciated Afebrile.  Heart rate 71. Potassium 3.4.  Creatinine 1.5.  WBC 5000.  Hemoglobin 12.5.  Glucose 146.  Objective: Vital signs in last 24 hours: Temp:  [97.7 F (36.5 C)-98.5 F (36.9 C)] 97.9 F (36.6 C) (12/31 0542) Pulse Rate:  [68-84] 71 (12/31 0542) Resp:  [16-18] 17 (12/31 0542) BP: (116-143)/(65-74) 116/74 mmHg (12/31 0542) SpO2:  [91 %-97 %] 96 % (12/31 0542) Last BM Date: 07/09/15  Intake/Output from previous day: 12/30 0701 - 12/31 0700 In: 1149.8 [P.O.:940; I.V.:59.8; IV Piggyback:150] Out: 1550 [OVZCH:8850] Intake/Output this shift: Total I/O In: 222 [P.O.:222] Out: -   General appearance: Alert.  Pleasant.  No distress.  Cooperative Resp: clear to auscultation bilaterally GI: Abdomen soft.  Active bowel sounds.  Incisions look good.  Lab Results:  Results for orders placed or performed during the hospital encounter of 07/10/15 (from the past 24 hour(s))  Glucose, capillary     Status: Abnormal   Collection Time: 07/12/15 12:29 PM  Result Value Ref Range   Glucose-Capillary 207 (H) 65 - 99 mg/dL  Glucose, capillary     Status: Abnormal   Collection Time: 07/12/15  4:34 PM  Result Value Ref Range   Glucose-Capillary 182 (H) 65 - 99 mg/dL  Glucose, capillary     Status: Abnormal   Collection Time: 07/12/15  7:29 PM  Result Value Ref Range   Glucose-Capillary 152 (H) 65 - 99 mg/dL  Glucose, capillary     Status: Abnormal   Collection Time: 07/12/15 11:34 PM  Result Value Ref Range   Glucose-Capillary 212 (H) 65 - 99 mg/dL   Comment 1 Notify RN   Glucose, capillary     Status: Abnormal   Collection Time: 07/13/15  3:40 AM  Result Value Ref Range   Glucose-Capillary 123 (H) 65 - 99 mg/dL   Comment 1  Notify RN   CBC     Status: Abnormal   Collection Time: 07/13/15  5:10 AM  Result Value Ref Range   WBC 5.0 4.0 - 10.5 K/uL   RBC 4.00 (L) 4.22 - 5.81 MIL/uL   Hemoglobin 12.5 (L) 13.0 - 17.0 g/dL   HCT 36.6 (L) 39.0 - 52.0 %   MCV 91.5 78.0 - 100.0 fL   MCH 31.3 26.0 - 34.0 pg   MCHC 34.2 30.0 - 36.0 g/dL   RDW 14.7 11.5 - 15.5 %   Platelets 126 (L) 150 - 400 K/uL  Basic metabolic panel     Status: Abnormal   Collection Time: 07/13/15  5:10 AM  Result Value Ref Range   Sodium 138 135 - 145 mmol/L   Potassium 3.4 (L) 3.5 - 5.1 mmol/L   Chloride 101 101 - 111 mmol/L   CO2 29 22 - 32 mmol/L   Glucose, Bld 146 (H) 65 - 99 mg/dL   BUN 14 6 - 20 mg/dL   Creatinine, Ser 1.05 0.61 - 1.24 mg/dL   Calcium 9.0 8.9 - 10.3 mg/dL   GFR calc non Af Amer >60 >60 mL/min   GFR calc Af Amer >60 >60 mL/min   Anion gap 8 5 - 15  Glucose, capillary     Status: Abnormal   Collection Time: 07/13/15  7:32 AM  Result Value Ref Range  Glucose-Capillary 165 (H) 65 - 99 mg/dL     Studies/Results: No results found.  . cefTRIAXone (ROCEPHIN)  IV  2 g Intravenous Q24H  . citalopram  40 mg Oral Daily  . enoxaparin (LOVENOX) injection  40 mg Subcutaneous Q24H  . furosemide  20 mg Oral Daily  . gabapentin  900 mg Oral TID  . insulin aspart  0-15 Units Subcutaneous 6 times per day  . insulin glargine  20 Units Subcutaneous Daily  . metoprolol  50 mg Oral BID  . metronidazole  500 mg Intravenous Q8H  . pantoprazole (PROTONIX) IV  40 mg Intravenous QHS  . potassium chloride  40 mEq Oral BID  . prednisoLONE acetate  1 drop Left Eye QID     Assessment/Plan: s/p Procedure(s): APPENDECTOMY LAPAROSCOPIC Acute appendicitis POD#3 laparoscopic appendectomy---Dr. Donne Hazel -stable, advance diet, mobilize should be ready for discharge from surgery standpoint tomorrow if okay with family medicine service Acute respiratory failure-extubated 12/29. Pulling 1730m on IS.  Doing well ID-recephin/flagyl  2/5D VTE prophylaxis-SCD, add lovenox DM II-CBGs/SSI--adequately controlled CAD-hx CHF, CAD, MI s/p stent, aorto-bifem bypass, HTN FEN-fulls, add PO pain meds. Hypokalemia-465m kdur bid today AM labs.  Dispo-home tomorrow, hopefully   @PROBHOSP @  LOS: 3 days    Larry Terrell M 07/13/2015  . .prob

## 2015-07-14 LAB — CBC
HCT: 36.9 % — ABNORMAL LOW (ref 39.0–52.0)
Hemoglobin: 13 g/dL (ref 13.0–17.0)
MCH: 32 pg (ref 26.0–34.0)
MCHC: 35.2 g/dL (ref 30.0–36.0)
MCV: 90.9 fL (ref 78.0–100.0)
Platelets: 136 10*3/uL — ABNORMAL LOW (ref 150–400)
RBC: 4.06 MIL/uL — ABNORMAL LOW (ref 4.22–5.81)
RDW: 14.6 % (ref 11.5–15.5)
WBC: 5.6 10*3/uL (ref 4.0–10.5)

## 2015-07-14 LAB — BASIC METABOLIC PANEL
Anion gap: 10 (ref 5–15)
BUN: 14 mg/dL (ref 6–20)
CALCIUM: 9.6 mg/dL (ref 8.9–10.3)
CO2: 28 mmol/L (ref 22–32)
Chloride: 102 mmol/L (ref 101–111)
Creatinine, Ser: 1.23 mg/dL (ref 0.61–1.24)
GFR calc Af Amer: >60 mL/min (ref >60)
GLUCOSE: 184 mg/dL — AB (ref 65–99)
Potassium: 4.3 mmol/L (ref 3.5–5.1)
Sodium: 140 mmol/L (ref 135–145)

## 2015-07-14 LAB — GLUCOSE, CAPILLARY: Glucose-Capillary: 174 mg/dL — ABNORMAL HIGH (ref 65–99)

## 2015-07-14 MED ORDER — OXYCODONE-ACETAMINOPHEN 5-325 MG PO TABS: 1.0000 | ORAL_TABLET | ORAL | Status: DC | PRN

## 2015-07-14 NOTE — Discharge Summary (Signed)
Patient ID: Larry Terrell [id number] 55 y.o. [birth year]  Admit date: 07/10/2015  Discharge date and time: 07/14/2015  Admitting Physician: Serita Grammes  Discharge Physician: Adin Hector  Admission Diagnoses: Acute appendicitis, unspecified acute appendicitis type [K35.80]  Discharge Diagnoses: Acute appendicitis, nonruptured                                         Postoperative respiratory in sufficiency.  Required reintubation for 24 hours.  Resolved                                         Hypertension                                         Diabetes mellitus, type II, insulin-dependent                                         Depression                                         Diabetic neuropathy                                         GERD                                         Obesity  Operations: Procedure(s): APPENDECTOMY LAPAROSCOPIC  Admission Condition: fair  Discharged Condition: good  Indication for Admission: This is a 55 year old gentleman with the above medical comorbidities.  He presented with abdominal pain for 3 days which had been getting worse.  He was nauseated but did not vomit.  No fever or chills.  Pain got worse and he came to the emergency department.  On exam he was found to have abdominal distention, mild, right lower quadrant tenderness with guarding.  No other gross abnormalities were noted on physical exam.  CT scan showed acute appendicitis but no evidence of abscess or perforation.  Hospital Course: Following emergent department evaluation, the patient was resuscitated with IV fluids, pain medication and antibiotics.  He was taken to the operating room by Dr. [facility] and underwent laparoscopic appendectomy for acute appendicitis without perforation or abscess.  The surgery went fairly well.  Postoperatively he had to be reintubated for 24 hours due to respiratory insufficiency.  Critical care medicine was involved in his care.  On the next day  he did well was extubated.  He was transferred to the floor.  He progressed in his diet and activities. On the day of discharge he was tolerating a regular carb modified diet and had had a bowel movements.  He had no wound problems.  His abdomen was somewhat obese but not distended and the wounds looked good. He was given a prescription for Percocet for pain and told to continue  all his usual medications including his 70/30 insulin regimen at home.  It was not felt that he needed any further antibiotics.  He has appointment see his primary care physician in [place] next week.  We've asked him to come back and see Dr. Donne [facility] in 3 weeks.  Diet and activities were discussed.       Consults: Critical care medicine for ventilator care.  [facility] hospitalist for management of his multiple medical problems  Significant Diagnostic Studies: Lab work, imaging studies, and surgical pathology   Treatments: Surgery for laparoscopic appendectomy, ventilator care.    Disposition: Home  Patient Instructions:    Medication List    TAKE these medications        atorvastatin 20 MG tablet  Commonly known as:  LIPITOR  Take 20 mg by mouth daily.     citalopram 40 MG tablet  Commonly known as:  CELEXA  Take 40 mg by mouth daily.     furosemide 20 MG tablet  Commonly known as:  LASIX  Take 20 mg by mouth daily.     gabapentin 300 MG capsule  Commonly known as:  NEURONTIN  Take 900 mg by mouth 3 (three) times daily.     hydrochlorothiazide 25 MG tablet  Commonly known as:  HYDRODIURIL  Take 25 mg by mouth daily as needed (only when taking tramadol).     insulin NPH-regular Human (70-30) 100 UNIT/ML injection  Commonly known as:  NOVOLIN 70/30  Inject 20-24 Units into the skin 2 (two) times daily. Take 24units in the morning and 20units in the evening.     metoprolol 50 MG tablet  Commonly known as:  LOPRESSOR  Take 50 mg by mouth 2 (two) times daily.     nitroGLYCERIN 0.4 MG SL tablet   Commonly known as:  NITROSTAT  Place 0.4 mg under the tongue every 5 (five) minutes as needed for chest pain.     oxyCODONE-acetaminophen 5-325 MG tablet  Commonly known as:  PERCOCET/ROXICET  Take 1-2 tablets by mouth every 4 (four) hours as needed for moderate pain or severe pain.     prednisoLONE acetate 1 % ophthalmic suspension  Commonly known as:  PRED FORTE  Place 1 drop into the left eye 4 (four) times daily.     traMADol 50 MG tablet  Commonly known as:  ULTRAM  Take 1 tablet (50 mg total) by mouth every 6 (six) hours as needed.        Activity: no heavy lifting for 2 weeks Diet: diabetic diet Wound Care: none needed  Follow-up:  WithDr. Donne [facility]  in 3 weeks.    Signed: Edsel Petrin. Dalbert Batman, M.D., FACS General and minimally invasive surgery Breast and Colorectal Surgery  07/14/2015, 9:43 AM

## 2015-07-14 NOTE — Progress Notes (Signed)
Discharge instructions reviewed with pt and prescription given.  Pt verbalized understanding and had no questions.  Pt discharged in stable condition via wheelchair with family.  Larry Terrell

## 2015-07-15 LAB — CULTURE, BLOOD (ROUTINE X 2)
Culture: NO GROWTH
Culture: NO GROWTH

## 2015-11-29 ENCOUNTER — Encounter: Payer: Self-pay | Admitting: Emergency Medicine

## 2015-11-29 ENCOUNTER — Emergency Department: Payer: Medicare Other

## 2015-11-29 ENCOUNTER — Emergency Department
Admission: EM | Admit: 2015-11-29 | Discharge: 2015-11-29 | Disposition: A | Discharge status: Home / Self Care | Payer: Medicare Other | Attending: Emergency Medicine | Admitting: Emergency Medicine

## 2015-11-29 DIAGNOSIS — Z8673 Personal history of transient ischemic attack (TIA), and cerebral infarction without residual deficits: Secondary | ICD-10-CM | POA: Diagnosis not present

## 2015-11-29 DIAGNOSIS — J449 Chronic obstructive pulmonary disease, unspecified: Secondary | ICD-10-CM | POA: Insufficient documentation

## 2015-11-29 DIAGNOSIS — Z794 Long term (current) use of insulin: Secondary | ICD-10-CM | POA: Insufficient documentation

## 2015-11-29 DIAGNOSIS — Z87891 Personal history of nicotine dependence: Secondary | ICD-10-CM | POA: Insufficient documentation

## 2015-11-29 DIAGNOSIS — Z79899 Other long term (current) drug therapy: Secondary | ICD-10-CM | POA: Diagnosis not present

## 2015-11-29 DIAGNOSIS — E1159 Type 2 diabetes mellitus with other circulatory complications: Secondary | ICD-10-CM | POA: Insufficient documentation

## 2015-11-29 DIAGNOSIS — E1140 Type 2 diabetes mellitus with diabetic neuropathy, unspecified: Secondary | ICD-10-CM | POA: Diagnosis not present

## 2015-11-29 DIAGNOSIS — F329 Major depressive disorder, single episode, unspecified: Secondary | ICD-10-CM | POA: Insufficient documentation

## 2015-11-29 DIAGNOSIS — R0789 Other chest pain: Secondary | ICD-10-CM | POA: Insufficient documentation

## 2015-11-29 DIAGNOSIS — I501 Left ventricular failure: Secondary | ICD-10-CM | POA: Diagnosis not present

## 2015-11-29 DIAGNOSIS — R079 Chest pain, unspecified: Secondary | ICD-10-CM

## 2015-11-29 LAB — COMPREHENSIVE METABOLIC PANEL
ALBUMIN: 4.4 g/dL (ref 3.5–5.0)
ALK PHOS: 91 U/L (ref 38–126)
ALT: 86 U/L — AB (ref 17–63)
AST: 80 U/L — AB (ref 15–41)
Anion gap: 8 (ref 5–15)
BUN: 19 mg/dL (ref 6–20)
CALCIUM: 9.5 mg/dL (ref 8.9–10.3)
CHLORIDE: 101 mmol/L (ref 101–111)
CO2: 27 mmol/L (ref 22–32)
CREATININE: 1.21 mg/dL (ref 0.61–1.24)
GFR calc non Af Amer: >60 mL/min (ref >60)
GLUCOSE: 304 mg/dL — AB (ref 65–99)
Potassium: 4.5 mmol/L (ref 3.5–5.1)
SODIUM: 136 mmol/L (ref 135–145)
Total Bilirubin: 1.6 mg/dL — ABNORMAL HIGH (ref 0.3–1.2)
Total Protein: 7.4 g/dL (ref 6.5–8.1)

## 2015-11-29 LAB — TROPONIN I: Troponin I: <0.03 ng/mL (ref <0.031)

## 2015-11-29 LAB — CBC WITH DIFFERENTIAL/PLATELET
BASOS ABS: 0.1 10*3/uL (ref 0–0.1)
EOS ABS: 0.4 10*3/uL (ref 0–0.7)
HCT: 45.7 % (ref 40.0–52.0)
HEMOGLOBIN: 15.8 g/dL (ref 13.0–18.0)
LYMPHS ABS: 1.4 10*3/uL (ref 1.0–3.6)
Lymphocytes Relative: 18 %
MCH: 31.9 pg (ref 26.0–34.0)
MCHC: 34.6 g/dL (ref 32.0–36.0)
MCV: 92 fL (ref 80.0–100.0)
Monocytes Absolute: 0.5 10*3/uL (ref 0.2–1.0)
Monocytes Relative: 7 %
Neutro Abs: 5.5 10*3/uL (ref 1.4–6.5)
PLATELETS: 173 10*3/uL (ref 150–440)
RBC: 4.97 MIL/uL (ref 4.40–5.90)
RDW: 15 % — ABNORMAL HIGH (ref 11.5–14.5)
WBC: 8 10*3/uL (ref 3.8–10.6)

## 2015-11-29 MED ORDER — TRAMADOL HCL 50 MG PO TABS
ORAL_TABLET | ORAL | Status: AC
  Filled 2015-11-29: qty 2

## 2015-11-29 MED ORDER — TRAMADOL HCL 50 MG PO TABS
100.0000 mg | ORAL_TABLET | Freq: Once | ORAL | Status: AC
  Administered 2015-11-29: 100 mg via ORAL

## 2015-11-29 NOTE — Discharge Instructions (Signed)
You have been seen in the emergency department today for chest pain. Your workup has shown normal results. As we discussed please follow-up with your primary care physician in the next 1-2 days for recheck. Return to the emergency department for any further chest pain, trouble breathing, or any other symptom personally concerning to yourself.  Nonspecific Chest Pain It is often hard to find the cause of chest pain. There is always a chance that your pain could be related to something serious, such as a heart attack or a blood clot in your lungs. Chest pain can also be caused by conditions that are not life-threatening. If you have chest pain, it is very important to follow up with your doctor.  HOME CARE  If you were prescribed an antibiotic medicine, finish it all even if you start to feel better.  Avoid any activities that cause chest pain.  Do not use any tobacco products, including cigarettes, chewing tobacco, or electronic cigarettes. If you need help quitting, ask your doctor.  Do not drink alcohol.  Take medicines only as told by your doctor.  Keep all follow-up visits as told by your doctor. This is important. This includes any further testing if your chest pain does not go away.  Your doctor may tell you to keep your head raised (elevated) while you sleep.  Make lifestyle changes as told by your doctor. These may include:  Getting regular exercise. Ask your doctor to suggest some activities that are safe for you.  Eating a heart-healthy diet. Your doctor or a diet specialist (dietitian) can help you to learn healthy eating options.  Maintaining a healthy weight.  Managing diabetes, if necessary.  Reducing stress. GET HELP IF:  Your chest pain does not go away, even after treatment.  You have a rash with blisters on your chest.  You have a fever. GET HELP RIGHT AWAY IF:  Your chest pain is worse.  You have an increasing cough, or you cough up blood.  You have  severe belly (abdominal) pain.  You feel extremely weak.  You pass out (faint).  You have chills.  You have sudden, unexplained chest discomfort.  You have sudden, unexplained discomfort in your arms, back, neck, or jaw.  You have shortness of breath at any time.  You suddenly start to sweat, or your skin gets clammy.  You feel nauseous.  You vomit.  You suddenly feel [place]-headed or dizzy.  Your heart begins to beat quickly, or it feels like it is skipping beats. These symptoms may be an emergency. Do not wait to see if the symptoms will go away. Get medical help right away. Call your local emergency services (911 in the [place] Do not drive yourself to the hospital.   This information is not intended to replace advice given to you by your health care provider. Make sure you discuss any questions you have with your health care provider.   Document Released: 12/16/2007 Document Revised: 07/20/2014 Document Reviewed: 02/02/2014 Elsevier Interactive Patient Education [employer].

## 2015-11-29 NOTE — ED Notes (Signed)
Reports chest pain, cough and cold sweats.  Skin w/d with good color at this time.

## 2015-11-29 NOTE — ED Provider Notes (Signed)
[facility] Emergency Department Provider Note  Time seen: 5:17 PM  I have reviewed the triage vital signs and the nursing notes.   HISTORY  Chief Complaint Chest Pain and Cough    HPI Larry Ediel Terrell is a 55 y.o. male with a past medical history of CAD, MI, CHF, diabetes, hypertension, hyperlipidemia who presents to the emergency department with 3 days of chest discomfort. According to the patient for the past 3 days he states he has been having intermittent chest discomfort which feels like a tightness sensation to the center of his chest. Denies any shortness of breath, does state occasional nausea but denies any vomiting or diaphoresis.Patient states he has had chills in the last few days and occasional cough as well. Denies fever or sputum production. He states his symptoms are rather mild but he is leaving town tomorrow and wanted to make sure everything was okay before he left. Currently states a mild chest tightness sensation.     Past Medical History  Diagnosis Date  . Heart attack ([place])   . CHF (congestive heart failure) (HCC)     diastolic (echo at [facility] 5374) EF 55-60%  . Neuropathy ([place])   . COPD (chronic obstructive pulmonary disease) ([place])   . Diabetes mellitus with neuropathy ([place])   . Temporal giant cell arteritis ([place])   . Depression   . OSA (obstructive sleep apnea)   . Peripheral vascular disease due to secondary diabetes mellitus ([place])   . Cirrhosis ([place])     NASH per records  . TIA (transient ischemic attack) April 2016  . Splenomegaly     Patient Active Problem List   Diagnosis Date Noted  . Acute appendicitis 07/10/2015  . S/P laparoscopic appendectomy 07/10/2015  . Appendicitis, acute   . Diabetes mellitus with complication ([place])   . Chronic congestive heart failure with left ventricular diastolic dysfunction ([place])   . Lactic acidosis   . Acute respiratory failure with hypoxia [facility])     Past Surgical History  Procedure  Laterality Date  . Cardiac stents  July 2015   . Cardiac catheterization    . 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 Laparoscopic appendectomy N/A 07/10/2015    Procedure: APPENDECTOMY LAPAROSCOPIC;  Surgeon: Rolm Bookbinder, MD;  Location: [facility];  Service: General;  Laterality: N/A;    Current Outpatient Rx  Name  Route  Sig  Dispense  Refill  . atorvastatin (LIPITOR) 20 MG tablet   Oral   Take 20 mg by mouth daily.         . citalopram (CELEXA) 40 MG tablet   Oral   Take 40 mg by mouth daily.         . furosemide (LASIX) 20 MG tablet   Oral   Take 20 mg by mouth daily.         Marland Kitchen gabapentin (NEURONTIN) 300 MG capsule   Oral   Take 900 mg by mouth 3 (three) times daily.         . hydrochlorothiazide (HYDRODIURIL) 25 MG tablet   Oral   Take 25 mg by mouth daily as needed (only when taking tramadol).         . insulin NPH-regular Human (NOVOLIN 70/30) (70-30) 100 UNIT/ML injection   Subcutaneous   Inject 20-24 Units into the skin 2 (two) times daily. Take 24units in the morning and 20units in the evening.         . metoprolol (LOPRESSOR) 50  MG tablet   Oral   Take 50 mg by mouth 2 (two) times daily.         . nitroGLYCERIN (NITROSTAT) 0.4 MG SL tablet   Sublingual   Place 0.4 mg under the tongue every 5 (five) minutes as needed for chest pain.         Marland Kitchen oxyCODONE-acetaminophen (PERCOCET/ROXICET) 5-325 MG tablet   Oral   Take 1-2 tablets by mouth every 4 (four) hours as needed for moderate pain or severe pain.   30 tablet   0   . prednisoLONE acetate (PRED FORTE) 1 % ophthalmic suspension   Left Eye   Place 1 drop into the left eye 4 (four) times daily.         . traMADol (ULTRAM) 50 MG tablet   Oral   Take 1 tablet (50 mg total) by mouth every 6 (six) hours as needed. Patient taking differently: Take 50 mg by mouth every 6 (six) hours as needed for moderate pain.    12 tablet   0     Allergies Ace inhibitors  Family History   Problem Relation Age of Onset  . Heart attack Mother 35  . Stroke Mother   . Heart attack Father   . Alzheimer's disease Father     Social History Social History  Substance Use Topics  . Smoking status: Former Research scientist (life sciences)  . Smokeless tobacco: None     Comment: Quit 20016 per records  . Alcohol Use: No    Review of Systems Constitutional: Negative for fever. Cardiovascular: Central chest pain/tightness. Respiratory: Negative for shortness of breath. Positive for cough. Gastrointestinal: Negative for abdominal pain. Positive for nausea. Negative for vomiting or diarrhea. Musculoskeletal: Negative for back pain. Neurological: Negative for headache 10-point ROS otherwise negative.  ____________________________________________   PHYSICAL EXAM:  VITAL SIGNS: ED Triage Vitals  Enc Vitals Group     BP --      Pulse --      Resp --      Temp --      Temp src --      SpO2 --      Weight --      Height --      Head Cir --      Peak Flow --      Pain Score 11/29/15 1609 4     Pain Loc --      Pain Edu? --      Excl. in [place]? --     Constitutional: Alert and oriented. Well appearing and in no distress. Eyes: Normal exam ENT   Head: Normocephalic and atraumatic   Mouth/Throat: Mucous membranes are moist. Cardiovascular: Normal rate, regular rhythm. No murmur. Slight central chest tenderness to palpation. Respiratory: Normal respiratory effort without tachypnea nor retractions. Breath sounds are clear  Gastrointestinal: Soft and nontender. No distention.   Musculoskeletal: Nontender with normal range of motion in all extremities. No lower extremity tenderness or edema. Neurologic:  Normal speech and language. No gross focal neurologic deficits Skin:  Skin is warm, dry. There is a small 2 x 2 centimeter rash in the patient center of his chest with clear demarcation lines most consistent with a fungal infection. Psychiatric: Mood and affect are normal.    ____________________________________________    EKG  EKG reviewed and interpreted by myself shows normal sinus rhythm at 85 bpm, narrow QRS, left axis deviation, normal intervals, nonspecific but no concerning ST changes.  ____________________________________________    RADIOLOGY  Chest  x-ray shows no acute disease  ultrasound negative ____________________________________________    INITIAL IMPRESSION / ASSESSMENT AND PLAN / ED COURSE  Pertinent labs & imaging results that were available during my care of the patient were reviewed by me and considered in my medical decision making (see chart for details).  Patient presents to the emergency department with chest tightness and cough for the past 3 days along with chills. Overall the patient appears very well, no distress. Clear lung sounds on exam. Patient does have a small rash to the Center for chest which is most consistent with a fungal infection. Troponin is negative. EKG is within normal limits. The patient states a history of liver disease, has mildly elevated LFTs but no right upper quadrant tenderness. Given his mildly elevated LFTs which chest symptoms would proceed with a right upper quadrant ultrasound to rule out biliary disease. We will also obtain a repeat troponin, and a chest x-ray.  Chest x-ray shows no acute disease. Ultrasound shows no acute findings. Second troponin is negative. Patient continues to appear well, no symptoms at this time. We'll discharge home with primary care follow-up. I discussed strict chest pain return precautions.  ____________________________________________   FINAL CLINICAL IMPRESSION(S) / ED DIAGNOSES  Chest pain   Harvest [facility], MD 11/29/15 [phone]

## 2016-01-14 ENCOUNTER — Emergency Department
Admission: EM | Admit: 2016-01-14 | Discharge: 2016-01-14 | Disposition: A | Discharge status: Home / Self Care | Payer: Medicare Other | Attending: Emergency Medicine | Admitting: Emergency Medicine

## 2016-01-14 ENCOUNTER — Encounter: Payer: Self-pay | Admitting: Emergency Medicine

## 2016-01-14 DIAGNOSIS — I5032 Chronic diastolic (congestive) heart failure: Secondary | ICD-10-CM | POA: Diagnosis not present

## 2016-01-14 DIAGNOSIS — Z87891 Personal history of nicotine dependence: Secondary | ICD-10-CM | POA: Insufficient documentation

## 2016-01-14 DIAGNOSIS — J449 Chronic obstructive pulmonary disease, unspecified: Secondary | ICD-10-CM | POA: Insufficient documentation

## 2016-01-14 DIAGNOSIS — Z794 Long term (current) use of insulin: Secondary | ICD-10-CM | POA: Diagnosis not present

## 2016-01-14 DIAGNOSIS — Z8674 Personal history of sudden cardiac arrest: Secondary | ICD-10-CM | POA: Insufficient documentation

## 2016-01-14 DIAGNOSIS — F329 Major depressive disorder, single episode, unspecified: Secondary | ICD-10-CM | POA: Diagnosis not present

## 2016-01-14 DIAGNOSIS — E1151 Type 2 diabetes mellitus with diabetic peripheral angiopathy without gangrene: Secondary | ICD-10-CM | POA: Diagnosis not present

## 2016-01-14 DIAGNOSIS — Z79899 Other long term (current) drug therapy: Secondary | ICD-10-CM | POA: Diagnosis not present

## 2016-01-14 DIAGNOSIS — Z8673 Personal history of transient ischemic attack (TIA), and cerebral infarction without residual deficits: Secondary | ICD-10-CM | POA: Insufficient documentation

## 2016-01-14 DIAGNOSIS — M545 Low back pain, unspecified: Secondary | ICD-10-CM

## 2016-01-14 DIAGNOSIS — Z955 Presence of coronary angioplasty implant and graft: Secondary | ICD-10-CM | POA: Insufficient documentation

## 2016-01-14 DIAGNOSIS — Z951 Presence of aortocoronary bypass graft: Secondary | ICD-10-CM | POA: Diagnosis not present

## 2016-01-14 MED ORDER — OXYCODONE-ACETAMINOPHEN 5-325 MG PO TABS: 1.0000 | ORAL_TABLET | Freq: Four times a day (QID) | ORAL | Status: DC | PRN

## 2016-01-14 MED ORDER — DEXAMETHASONE SODIUM PHOSPHATE 10 MG/ML IJ SOLN
10.0000 mg | Freq: Once | INTRAMUSCULAR | Status: AC
Start: 2016-01-14 — End: 2016-01-14
  Administered 2016-01-14: 10 mg via INTRAMUSCULAR
  Filled 2016-01-14: qty 1

## 2016-01-14 MED ORDER — OXYCODONE-ACETAMINOPHEN 5-325 MG PO TABS
2.0000 | ORAL_TABLET | Freq: Once | ORAL | Status: AC
  Administered 2016-01-14: 2 via ORAL
  Filled 2016-01-14: qty 2

## 2016-01-14 NOTE — Discharge Instructions (Signed)

## 2016-01-14 NOTE — ED Provider Notes (Addendum)
[facility] Emergency Department Provider Note  Time seen: 12:20 PM  I have reviewed the triage vital signs and the nursing notes.   HISTORY  Chief Complaint Back Pain    HPI Larry Terrell is a 55 y.o. male with a past medical history of CHF, MI, PVD, liver disease, TIA, presents the emergency department with back pain. According to the patient he has chronic back issues but is not thrown his back on a long time. States he bent over yesterday to pick something up and upon standing back up felt a pop in his back and since then has been having significant pain to his lower back. States the pain is much worse with movement. Denies any abdominal pain. Denies any bladder or bowel incontinence. Denies any focal weakness or numbness of either leg. Describes the back pain is moderate when resting, severe if moving twisting or bending.     Past Medical History  Diagnosis Date  . Heart attack ([place])   . CHF (congestive heart failure) (HCC)     diastolic (echo at [facility] 7544) EF 55-60%  . Neuropathy ([place])   . COPD (chronic obstructive pulmonary disease) ([place])   . Diabetes mellitus with neuropathy ([place])   . Temporal giant cell arteritis ([place])   . Depression   . OSA (obstructive sleep apnea)   . Peripheral vascular disease due to secondary diabetes mellitus ([place])   . Cirrhosis ([place])     NASH per records  . TIA (transient ischemic attack) April 2016  . Splenomegaly     Patient Active Problem List   Diagnosis Date Noted  . Acute appendicitis 07/10/2015  . S/P laparoscopic appendectomy 07/10/2015  . Appendicitis, acute   . Diabetes mellitus with complication ([place])   . Chronic congestive heart failure with left ventricular diastolic dysfunction ([place])   . Lactic acidosis   . Acute respiratory failure with hypoxia [facility])     Past Surgical History  Procedure Laterality Date  . Cardiac stents  July 2015   . Cardiac catheterization    . Aorta - femoral artery bypass  graft Bilateral   . Laparoscopic appendectomy N/A 07/10/2015    Procedure: APPENDECTOMY LAPAROSCOPIC;  Surgeon: Rolm Bookbinder, MD;  Location: [facility];  Service: General;  Laterality: N/A;    Current Outpatient Rx  Name  Route  Sig  Dispense  Refill  . atorvastatin (LIPITOR) 20 MG tablet   Oral   Take 20 mg by mouth daily.         . citalopram (CELEXA) 40 MG tablet   Oral   Take 40 mg by mouth daily.         . furosemide (LASIX) 20 MG tablet   Oral   Take 20 mg by mouth daily.         Marland Kitchen gabapentin (NEURONTIN) 300 MG capsule   Oral   Take 900 mg by mouth 3 (three) times daily.         . hydrochlorothiazide (HYDRODIURIL) 25 MG tablet   Oral   Take 25 mg by mouth daily as needed (only when taking tramadol).         . insulin NPH-regular Human (NOVOLIN 70/30) (70-30) 100 UNIT/ML injection   Subcutaneous   Inject 20-24 Units into the skin 2 (two) times daily. Take 24units in the morning and 20units in the evening.         . metoprolol (LOPRESSOR) 50 MG tablet   Oral   Take 50 mg by  mouth 2 (two) times daily.         . nitroGLYCERIN (NITROSTAT) 0.4 MG SL tablet   Sublingual   Place 0.4 mg under the tongue every 5 (five) minutes as needed for chest pain.         Marland Kitchen oxyCODONE-acetaminophen (PERCOCET/ROXICET) 5-325 MG tablet   Oral   Take 1-2 tablets by mouth every 4 (four) hours as needed for moderate pain or severe pain.   30 tablet   0   . prednisoLONE acetate (PRED FORTE) 1 % ophthalmic suspension   Left Eye   Place 1 drop into the left eye 4 (four) times daily.         . traMADol (ULTRAM) 50 MG tablet   Oral   Take 1 tablet (50 mg total) by mouth every 6 (six) hours as needed. Patient taking differently: Take 50 mg by mouth every 6 (six) hours as needed for moderate pain.    12 tablet   0     Allergies Ace inhibitors  Family History  Problem Relation Age of Onset  . Heart attack Mother 47  . Stroke Mother   . Heart attack Father   .  Alzheimer's disease Father     Social History Social History  Substance Use Topics  . Smoking status: Former Research scientist (life sciences)  . Smokeless tobacco: None     Comment: Quit 20016 per records  . Alcohol Use: No    Review of Systems Constitutional: Negative for fever. Cardiovascular: Negative for chest pain. Respiratory: Negative for shortness of breath. Gastrointestinal: Negative for abdominal pain Musculoskeletal: Positive for lower back pain Skin: Negative for rash. Neurological: Negative for headache 10-point ROS otherwise negative.  ____________________________________________   PHYSICAL EXAM:  VITAL SIGNS: ED Triage Vitals  Enc Vitals Group     BP 01/14/16 1201 156/105 mmHg     Pulse Rate 01/14/16 1201 86     Resp 01/14/16 1201 20     Temp 01/14/16 1201 97.8 F (36.6 C)     Temp Source 01/14/16 1201 Oral     SpO2 01/14/16 1201 98 %     Weight 01/14/16 1201 216 lb (97.977 kg)     Height 01/14/16 1201 5' 7"  (1.702 m)     Head Cir --      Peak Flow --      Pain Score 01/14/16 1202 10     Pain Loc --      Pain Edu? --      Excl. in [place]? --     Constitutional: Alert and oriented. Well appearing and in no distress. Eyes: Normal exam ENT   Head: Normocephalic and atraumatic.   Mouth/Throat: Mucous membranes are moist. Cardiovascular: Normal rate, regular rhythm. No murmur Respiratory: Normal respiratory effort without tachypnea nor retractions. Breath sounds are clear  Gastrointestinal: Soft and nontender. No distention.   Musculoskeletal: Nontender with normal range of motion in all extremities. Moderate lumbar tenderness to palpation across the mid to lower lumbar spine and left paraspinal muscles. No deformity. No ecchymosis. Neurologic:  Normal speech and language. No gross focal neurologic deficits Skin:  Skin is warm, dry and intact.  Psychiatric: Mood and affect are normal.  ____________________________________________   INITIAL IMPRESSION / ASSESSMENT AND  PLAN / ED COURSE  Pertinent labs & imaging results that were available during my care of the patient were reviewed by me and considered in my medical decision making (see chart for details).  The patient presents the emergency department with acute onset  of lower back pain beginning yesterday. Patient states he stood up abruptly felt a pop and since then has been experiencing significant lower back pain. Denies any weakness or numbness. Denies any incontinence. Denies any fever. Patient does have tenderness to palpation of the mid to lower L-spine and left paraspinal muscles. States the pain is much worse with movement. Patient denies any traumatic event. I do not feel imaging would add much value at this point. We will dose dexamethasone 1, discharged with a short course of Percocet. The patient's controlled substance record has been reviewed by myself.  The patient does also state that both of his feet have been hurting for many months, worse with ambulating. He has been referred to a vascular surgeon, denies any acute worsening of his pain. His extremities are warm, neurovascularly intact with normal cap refill.  ____________________________________________   FINAL CLINICAL IMPRESSION(S) / ED DIAGNOSES  Low back pain   Harvest [facility], MD 01/14/16 1223  Harvest [facility], MD 01/14/16 1224

## 2016-01-14 NOTE — ED Notes (Signed)
Pt presents with back pain since yesterday. States it "popped" yesterday and the back "went out" for the first time in a long time.

## 2016-08-18 ENCOUNTER — Encounter: Payer: Medicare Other | Admitting: Physical Therapy

## 2016-08-25 ENCOUNTER — Encounter: Payer: Medicare Other | Admitting: Physical Therapy

## 2016-09-11 ENCOUNTER — Ambulatory Visit: Payer: Medicare Other | Attending: Psychiatry | Admitting: Physical Therapy

## 2016-09-11 DIAGNOSIS — R262 Difficulty in walking, not elsewhere classified: Secondary | ICD-10-CM | POA: Diagnosis present

## 2016-09-11 DIAGNOSIS — M25511 Pain in right shoulder: Secondary | ICD-10-CM | POA: Diagnosis not present

## 2016-09-11 DIAGNOSIS — M6281 Muscle weakness (generalized): Secondary | ICD-10-CM | POA: Insufficient documentation

## 2016-09-11 DIAGNOSIS — G8929 Other chronic pain: Secondary | ICD-10-CM | POA: Diagnosis present

## 2016-09-11 NOTE — Patient Instructions (Addendum)
BP - 146/91  HR - 66   L WNL for AROM and MMT from shoulder to grip strength  R - IR MMT 3+/5 and pain limiting (mild positive belly press test)   R - AROM flexion - 128 degrees, Abduction - 87 degrees   Cervical screen - R side flexion painful in neck, not with L  TX provided  Manual exam - hypomobile and tender from T4 superior to C1   Low Rows x 15 with Red band  Mid rows x 15 with red band   ER PROM - roughly 5 degrees initially (firm end feel as in soft tissue restriction x 1 minute of stretching   HEP - mid rows, low rows, seated thoracic extension.

## 2016-09-11 NOTE — Therapy (Signed)
[facility] [address], [place], [address] Phone: [phone]   Fax:  [phone]  Physical Therapy Evaluation  Patient Details  Name: Larry Terrell MRN: [id number] Date of Birth: 08/13/1960 No Data Recorded  Encounter Date: 09/11/2016      PT End of Session - 09/11/16 1207    Visit Number 1   Number of Visits 25   Date for PT Re-Evaluation 12/04/16   PT Start Time 1107   PT Stop Time 1200   PT Time Calculation (min) 53 min   Activity Tolerance Patient tolerated treatment well;Patient limited by pain   Behavior During Therapy [facility] for tasks assessed/performed      Past Medical History:  Diagnosis Date  . CHF (congestive heart failure) (HCC)    diastolic (echo at [facility] 0370) EF 55-60%  . Cirrhosis ([place])    NASH per records  . COPD (chronic obstructive pulmonary disease) ([place])   . Depression   . Diabetes mellitus with neuropathy ([place])   . Heart attack   . Neuropathy ([place])   . OSA (obstructive sleep apnea)   . Peripheral vascular disease due to secondary diabetes mellitus ([place])   . Splenomegaly   . Temporal giant cell arteritis ([name])   . TIA (transient ischemic attack) April 2016    Past Surgical History:  Procedure Laterality Date  . AORTA - FEMORAL ARTERY BYPASS GRAFT Bilateral   . CARDIAC CATHETERIZATION    . cardiac stents  July 2015   . LAPAROSCOPIC APPENDECTOMY N/A 07/10/2015   Procedure: APPENDECTOMY LAPAROSCOPIC;  Surgeon: Rolm Bookbinder, MD;  Location: [facility];  Service: General;  Laterality: N/A;    There were no vitals filed for this visit.       Subjective Assessment - 09/11/16 1113    Subjective Patient reports he began having difficulty lifting his R arm starting roughly 8 months ago. 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 He is R handed. He reports his main complaint is that it is hard to lift, and it hurts. He  always has some pain, but can have it get to a "10" with certain movements. He does get numbness/tingling bilaterally in hands, he attributes to diabetes. Denies any recent falls, but has a history of episodic ataxia. He is aware of when he will be experiencing an episode, however since he is not as active these are not as frequent.    Limitations Lifting;House hold activities   Diagnostic tests No imaging to date    Patient Stated Goals Would like to be able to lift his arm better, work on his balance.    Currently in Pain? Yes   Pain Score --  He is aware of shoulder pain   Pain Location Shoulder   Pain Orientation Right   Pain Descriptors / Indicators Aching   Pain Type Chronic pain   Pain Onset More than a month ago   Pain Frequency Intermittent      BP - 146/91  HR - 66   L WNL for AROM and MMT from shoulder to grip strength  R - IR MMT 3+/5 and pain limiting (mild positive belly press test)   R - AROM flexion - 128 degrees, Abduction - 87 degrees   Cervical screen - R side flexion painful in neck, not with L -- L cervical rotation mild increase in shoulder pain, full AROM, no pain  with R cervical rotation.   TX provided  Manual exam - hypomobile and tender from T4 superior to C1 -- no decrease in tenderness with repeated bouts of grade I-II mobilizations   Low Rows x 15 with Red band (cuing for scapular retraction)  Mid rows x 15 with red band   ER PROM - roughly 5 degrees initially (firm end feel as in soft tissue restriction x 1 minute of stretching   Soft tissue mobilization performed to R biceps/deltoid area with multiple tender/trigger points identified. Patient reported some relief of symptoms after completion.   Patient able to flex shoulder to 158 degrees at the conclusion of the session before the onset of pain   HEP - mid rows, low rows, seated thoracic extension.                           PT Education - 09/11/16 1206    Education  provided Yes   Education Details HEP, role of RTC, likely source of discomfort (decreased ER PROM) and transition from shoulder to balance as appropriate.   Person(s) Educated Patient   Methods Explanation;Demonstration;Handout   Comprehension Verbalized understanding;Returned demonstration             PT Long Term Goals - 09/11/16 1243      PT LONG TERM GOAL #1   Title Patient will demonstrate at least 160 degrees of active shoulder flexion ROM to perform household chores/ADLs.    Baseline 128 degrees    Time 12   Period Weeks   Status New     PT LONG TERM GOAL #2   Title Patient will demonstrate FGA score of greater than 26/30 to demonstrate improved balance and decreased falls risk.    Baseline Did not perform    Time 12   Period Weeks   Status New     PT LONG TERM GOAL #3   Title Patient will report QuickDash score of less than 35% to demonstrate improved tolerance for ADLs.    Baseline 43.2%   Time 12   Period Weeks   Status New               Plan - 09/11/16 1239    Clinical Impression Statement Patient reports extensive history of medical complications and co-morbidities, as well as chronic R shoulder pain. He demonstrates significant limitation in flexion and abduction AROM, ER PROM likely contributing to his complaints of pain. He demonstrates significant increase in AROM before onset of pain in this session with soft tissue mobilization and activation of posterior cuff/periscapular musculature. Patient also demonstrates significant hypomobility of his thoracic spine, limiting his OH mobility as well. Patient would benefit from skilled PT services to address his RUE pain, and his reports of difficulty with balance due to episodic ataxia.    Rehab Potential Good   PT Frequency 2x / week   PT Duration 12 weeks   PT Treatment/Interventions Aquatic Therapy;Moist Heat;Cryotherapy;Therapeutic exercise;Therapeutic activities;Balance training;Manual  techniques;Neuromuscular re-education;Stair training;Gait training;Taping;Dry needling   PT Next Visit Plan ER PROM, RTC strengthening    PT Home Exercise Plan Low rows, thoracic extensions over chair, mid rows   Consulted and Agree with Plan of Care Patient      Patient will benefit from skilled therapeutic intervention in order to improve the following deficits and impairments:  Abnormal gait, Pain, Impaired UE functional use, Decreased strength, Decreased balance, Difficulty walking  Visit Diagnosis: Chronic right shoulder pain - Plan: PT  plan of care cert/re-cert  Difficulty in walking, not elsewhere classified - Plan: PT plan of care cert/re-cert  Muscle weakness (generalized) - Plan: PT plan of care cert/re-cert      G-Codes - 04/04/29 1246    Functional Assessment Tool Used (Outpatient Only) AROM, Quick Dash    Functional Limitation Carrying, moving and handling objects   Carrying, Moving and Handling Objects Current Status (Q7622) At least 20 percent but less than 40 percent impaired, limited or restricted   Carrying, Moving and Handling Objects Goal Status (Q3335) At least 1 percent but less than 20 percent impaired, limited or restricted       Problem List Patient Active Problem List   Diagnosis Date Noted  . Acute appendicitis 07/10/2015  . S/P laparoscopic appendectomy 07/10/2015  . Appendicitis, acute   . Diabetes mellitus with complication ([place])   . Chronic congestive heart failure with left ventricular diastolic dysfunction ([place])   . Lactic acidosis   . Acute respiratory failure with hypoxia ([place])    Royce Macadamia PT, DPT, CSCS    09/11/2016, 12:49 PM  [facility] [address] [place], [address] Phone: [phone]   Fax:  [phone]  Name: Larry Terrell MRN: [id number] Date of Birth: 12-23-1960

## 2016-09-14 ENCOUNTER — Ambulatory Visit: Payer: Medicare Other | Admitting: Physical Therapy

## 2016-09-14 DIAGNOSIS — M25511 Pain in right shoulder: Secondary | ICD-10-CM | POA: Diagnosis not present

## 2016-09-14 DIAGNOSIS — G8929 Other chronic pain: Secondary | ICD-10-CM

## 2016-09-14 DIAGNOSIS — R262 Difficulty in walking, not elsewhere classified: Secondary | ICD-10-CM

## 2016-09-14 DIAGNOSIS — M6281 Muscle weakness (generalized): Secondary | ICD-10-CM

## 2016-09-14 NOTE — Therapy (Signed)
[facility] [address] [place], [address] Phone: [phone]   Fax:  [phone]  Physical Therapy Treatment  Patient Details  Name: Larry Terrell MRN: [id number] Date of Birth: 11-20-1960 No Data Recorded  Encounter Date: 09/14/2016      PT End of Session - 09/14/16 0930    Visit Number 2   Number of Visits 25   Date for PT Re-Evaluation 12/04/16   PT Start Time 0823   PT Stop Time 0847   PT Time Calculation (min) 24 min   Activity Tolerance Patient tolerated treatment well   Behavior During Therapy [facility] for tasks assessed/performed      Past Medical History:  Diagnosis Date  . CHF (congestive heart failure) (HCC)    diastolic (echo at [facility] 6962) EF 55-60%  . Cirrhosis ([place])    NASH per records  . COPD (chronic obstructive pulmonary disease) ([place])   . Depression   . Diabetes mellitus with neuropathy ([place])   . Heart attack   . Neuropathy ([place])   . OSA (obstructive sleep apnea)   . Peripheral vascular disease due to secondary diabetes mellitus ([place])   . Splenomegaly   . Temporal giant cell arteritis ([place])   . TIA (transient ischemic attack) April 2016    Past Surgical History:  Procedure Laterality Date  . AORTA - FEMORAL ARTERY BYPASS GRAFT Bilateral   . CARDIAC CATHETERIZATION    . cardiac stents  July 2015   . LAPAROSCOPIC APPENDECTOMY N/A 07/10/2015   Procedure: APPENDECTOMY LAPAROSCOPIC;  Surgeon: Rolm Bookbinder, MD;  Location: [facility];  Service: General;  Laterality: N/A;    There were no vitals filed for this visit.      Subjective Assessment - 09/14/16 0823    Subjective Patient reports his shoulder was a little more sore over the weekend than it has been. He reports his shoulder is feeling pretty good at the moment.    Limitations Lifting;House hold activities   Diagnostic tests No imaging to date    Patient Stated Goals Would like to be able to lift his arm better, work on  his balance.    Currently in Pain? No/denies      AROM - flexion - 132 degrees  PROM - ER ROM - 10 minutes to tolerance (painful, limited to roughly 60 degrees, but after completion able to flex to roughly 140 degrees before soreness onset in the biceps).   Soft tissue mobilization to R biceps muscle belly/lateral triceps muscle belly, up through deltoid (middle) muscle belly) patient reported relief of pain noted, noted to increase AROM after to 150-155 degrees, though this continued to be painful at the end of the range.                            PT Education - 09/14/16 0929    Education provided Yes   Education Details Improvement in ROM up to this point, plan of care (ROM/decreased pain then strengthening).    Person(s) Educated Patient   Methods Explanation;Demonstration   Comprehension Verbalized understanding;Returned demonstration             PT Long Term Goals - 09/11/16 1243      PT LONG TERM GOAL #1   Title Patient will demonstrate at least 160 degrees of active shoulder flexion ROM to perform household chores/ADLs.    Baseline 128 degrees    Time 12  Period Weeks   Status New     PT LONG TERM GOAL #2   Title Patient will demonstrate FGA score of greater than 26/30 to demonstrate improved balance and decreased falls risk.    Baseline Did not perform    Time 12   Period Weeks   Status New     PT LONG TERM GOAL #3   Title Patient will report QuickDash score of less than 35% to demonstrate improved tolerance for ADLs.    Baseline 43.2%   Time 12   Period Weeks   Status New               Plan - 09/14/16 0930    Clinical Impression Statement Patient demonstrates improved AROM in this session up to 148 degrees of forward elevation. He continues to have pain in the biceps muscle, and decreased PROM into ER, though improving with both. Discussed plan of care including transitioning from pain/ROM to strengthening when both are WNL.     Rehab Potential Good   PT Frequency 2x / week   PT Duration 12 weeks   PT Treatment/Interventions Aquatic Therapy;Moist Heat;Cryotherapy;Therapeutic exercise;Therapeutic activities;Balance training;Manual techniques;Neuromuscular re-education;Stair training;Gait training;Taping;Dry needling   PT Next Visit Plan ER PROM, RTC strengthening    PT Home Exercise Plan Low rows, thoracic extensions over chair, mid rows   Consulted and Agree with Plan of Care Patient      Patient will benefit from skilled therapeutic intervention in order to improve the following deficits and impairments:  Abnormal gait, Pain, Impaired UE functional use, Decreased strength, Decreased balance, Difficulty walking  Visit Diagnosis: Chronic right shoulder pain  Difficulty in walking, not elsewhere classified  Muscle weakness (generalized)     Problem List Patient Active Problem List   Diagnosis Date Noted  . Acute appendicitis 07/10/2015  . S/P laparoscopic appendectomy 07/10/2015  . Appendicitis, acute   . Diabetes mellitus with complication ([place])   . Chronic congestive heart failure with left ventricular diastolic dysfunction ([place])   . Lactic acidosis   . Acute respiratory failure with hypoxia ([place])    Royce Macadamia PT, DPT, CSCS    09/14/2016, 9:33 AM  [facility] [address], [place], [address] Phone: [phone]   Fax:  [phone]  Name: Larry Terrell MRN: [id number] Date of Birth: 10-06-1960

## 2016-09-14 NOTE — Patient Instructions (Addendum)
AROM - flexion - 132 degrees  PROM - ER ROM - 10 minutes to tolerance (painful, limited to roughly 60 degrees, but after completion able to flex to roughly 140 degrees before soreness onset in the biceps).   Soft tissue mobilization

## 2016-09-16 ENCOUNTER — Ambulatory Visit: Payer: Medicare Other | Admitting: Physical Therapy

## 2016-09-21 ENCOUNTER — Ambulatory Visit: Payer: Medicare Other | Admitting: Physical Therapy

## 2016-09-21 DIAGNOSIS — M6281 Muscle weakness (generalized): Secondary | ICD-10-CM

## 2016-09-21 DIAGNOSIS — M25511 Pain in right shoulder: Secondary | ICD-10-CM | POA: Diagnosis not present

## 2016-09-21 DIAGNOSIS — R262 Difficulty in walking, not elsewhere classified: Secondary | ICD-10-CM

## 2016-09-21 DIAGNOSIS — G8929 Other chronic pain: Secondary | ICD-10-CM

## 2016-09-21 NOTE — Patient Instructions (Addendum)
AROM - LUE - flexion to 160 RUE flexion to 135 degrees (pain with eccentric portion)   PROM into ER multiple bouts performed as patient was quite limited (5-10 degrees max) with oscillations.   Soft tissue mobilization   UE Ranger x 10 for 3 sets   Seated flexion to 167 degrees (with minimal pain during)  On RUE   Seated rows 25# x 10, 35#x  12, 45#

## 2016-09-21 NOTE — Therapy (Signed)
[facility] [address] [place], [address] Phone: [phone]   Fax:  [phone]  Physical Therapy Treatment  Patient Details  Name: Larry Terrell MRN: [id number] Date of Birth: 1961-03-26 No Data Recorded  Encounter Date: 09/21/2016      PT End of Session - 09/21/16 1346    Visit Number 3   Number of Visits 25   Date for PT Re-Evaluation 12/04/16   PT Start Time 1115   PT Stop Time 1156   PT Time Calculation (min) 41 min   Activity Tolerance Patient tolerated treatment well   Behavior During Therapy [facility] for tasks assessed/performed      Past Medical History:  Diagnosis Date  . CHF (congestive heart failure) (HCC)    diastolic (echo at [facility] 3016) EF 55-60%  . Cirrhosis ([place])    NASH per records  . COPD (chronic obstructive pulmonary disease) ([place])   . Depression   . Diabetes mellitus with neuropathy ([place])   . Heart attack   . Neuropathy ([place])   . OSA (obstructive sleep apnea)   . Peripheral vascular disease due to secondary diabetes mellitus ([place])   . Splenomegaly   . Temporal giant cell arteritis ([place])   . TIA (transient ischemic attack) April 2016    Past Surgical History:  Procedure Laterality Date  . AORTA - FEMORAL ARTERY BYPASS GRAFT Bilateral   . CARDIAC CATHETERIZATION    . cardiac stents  July 2015   . LAPAROSCOPIC APPENDECTOMY N/A 07/10/2015   Procedure: APPENDECTOMY LAPAROSCOPIC;  Surgeon: Rolm Bookbinder, MD;  Location: [facility];  Service: General;  Laterality: N/A;    There were no vitals filed for this visit.      Subjective Assessment - 09/21/16 1120    Subjective Patient got his appointment times mixed up for last session. He states the pain he is experiencing is roughly unchanged, but he is able to move his arm around much better.    Limitations Lifting;House hold activities   Diagnostic tests No imaging to date    Patient Stated Goals Would like to be able to lift  his arm better, work on his balance.    Currently in Pain? Yes   Pain Score --  Mild to moderate pain in R biceps with OH flexion   Pain Location Arm   Pain Orientation Right   Pain Descriptors / Indicators Aching   Pain Type Chronic pain   Pain Onset More than a month ago   Pain Frequency Intermittent      AROM - LUE - flexion to 160 RUE flexion to 135 degrees (pain with eccentric portion)   PROM into ER multiple bouts performed as patient was quite limited (5-10 degrees max) with oscillations.   Soft tissue mobilization to R biceps area, which was well tolerated. Patient reports decreased pain with OH flexion after completion.  UE Ranger x 10 for 3 sets -- through ~ 150-160 degrees with no increase in pain.   Seated flexion to 167 degrees (with minimal pain during)  On RUE   Seated rows 25# x 10, 35#x  12, 45# -- no pain reported                           PT Education - 09/21/16 1345    Education provided Yes   Education Details Continued improvement in RUE ROM, progression to strengthening then balance activities.  Person(s) Educated Patient   Methods Explanation;Demonstration   Comprehension Returned demonstration;Verbalized understanding             PT Long Term Goals - 09/11/16 1243      PT LONG TERM GOAL #1   Title Patient will demonstrate at least 160 degrees of active shoulder flexion ROM to perform household chores/ADLs.    Baseline 128 degrees    Time 12   Period Weeks   Status New     PT LONG TERM GOAL #2   Title Patient will demonstrate FGA score of greater than 26/30 to demonstrate improved balance and decreased falls risk.    Baseline Did not perform    Time 12   Period Weeks   Status New     PT LONG TERM GOAL #3   Title Patient will report QuickDash score of less than 35% to demonstrate improved tolerance for ADLs.    Baseline 43.2%   Time 12   Period Weeks   Status New               Plan - 09/21/16 1346     Clinical Impression Statement Patient continues to improve with AROM, reporting less pain during overhead flexion in this session. He is tolerating progression of strengthening and ROM, reporting decreased pain at upper ranges after stretching and manual treatment. His ER PROM continues to be quite limited.    Rehab Potential Good   PT Frequency 2x / week   PT Duration 12 weeks   PT Treatment/Interventions Aquatic Therapy;Moist Heat;Cryotherapy;Therapeutic exercise;Therapeutic activities;Balance training;Manual techniques;Neuromuscular re-education;Stair training;Gait training;Taping;Dry needling   PT Next Visit Plan ER PROM, RTC strengthening    PT Home Exercise Plan Low rows, thoracic extensions over chair, mid rows   Consulted and Agree with Plan of Care Patient      Patient will benefit from skilled therapeutic intervention in order to improve the following deficits and impairments:  Abnormal gait, Pain, Impaired UE functional use, Decreased strength, Decreased balance, Difficulty walking  Visit Diagnosis: Chronic right shoulder pain  Difficulty in walking, not elsewhere classified  Muscle weakness (generalized)     Problem List Patient Active Problem List   Diagnosis Date Noted  . Acute appendicitis 07/10/2015  . S/P laparoscopic appendectomy 07/10/2015  . Appendicitis, acute   . Diabetes mellitus with complication ([place])   . Chronic congestive heart failure with left ventricular diastolic dysfunction ([place])   . Lactic acidosis   . Acute respiratory failure with hypoxia ([place])    Royce Macadamia PT, DPT, CSCS    09/21/2016, 1:49 PM  [facility] [address] [place], [address] Phone: [phone]   Fax:  [phone]  Name: Donelle Baba MRN: [id number] Date of Birth: [birth year]

## 2016-09-24 ENCOUNTER — Ambulatory Visit: Payer: Medicare Other | Admitting: Physical Therapy

## 2016-09-28 ENCOUNTER — Ambulatory Visit: Payer: Medicare Other | Admitting: Physical Therapy

## 2016-09-28 DIAGNOSIS — M25511 Pain in right shoulder: Secondary | ICD-10-CM | POA: Diagnosis not present

## 2016-09-28 DIAGNOSIS — M6281 Muscle weakness (generalized): Secondary | ICD-10-CM

## 2016-09-28 DIAGNOSIS — R262 Difficulty in walking, not elsewhere classified: Secondary | ICD-10-CM

## 2016-09-28 DIAGNOSIS — G8929 Other chronic pain: Secondary | ICD-10-CM

## 2016-09-28 NOTE — Therapy (Signed)
[facility] [address] [place], [address] Phone: [phone]   Fax:  [phone]  Physical Therapy Treatment  Patient Details  Name: Larry Terrell MRN: [id number] Date of Birth: Dec 02, 1960 No Data Recorded  Encounter Date: 09/28/2016      PT End of Session - 09/28/16 1122    Visit Number 4   Number of Visits 25   Date for PT Re-Evaluation 12/04/16   PT Start Time 1123   PT Stop Time 1202   PT Time Calculation (min) 39 min   Activity Tolerance Patient tolerated treatment well   Behavior During Therapy [facility] for tasks assessed/performed      Past Medical History:  Diagnosis Date  . CHF (congestive heart failure) (HCC)    diastolic (echo at [facility] 0539) EF 55-60%  . Cirrhosis ([place])    NASH per records  . COPD (chronic obstructive pulmonary disease) ([place])   . Depression   . Diabetes mellitus with neuropathy ([place])   . Heart attack   . Neuropathy ([place])   . OSA (obstructive sleep apnea)   . Peripheral vascular disease due to secondary diabetes mellitus ([place])   . Splenomegaly   . Temporal giant cell arteritis ([place])   . TIA (transient ischemic attack) April 2016    Past Surgical History:  Procedure Laterality Date  . AORTA - FEMORAL ARTERY BYPASS GRAFT Bilateral   . CARDIAC CATHETERIZATION    . cardiac stents  July 2015   . LAPAROSCOPIC APPENDECTOMY N/A 07/10/2015   Procedure: APPENDECTOMY LAPAROSCOPIC;  Surgeon: Rolm Bookbinder, MD;  Location: [facility];  Service: General;  Laterality: N/A;    There were no vitals filed for this visit.      Subjective Assessment - 09/28/16 1122    Subjective Patient reports both of his shoulders have been feeling pretty good, they were a bit sore this morning, he thinks from the way he slept on them.    Limitations Lifting;House hold activities   Diagnostic tests No imaging to date    Patient Stated Goals Would like to be able to lift his arm better, work on his  balance.    Currently in Pain? Yes   Pain Location Arm   Pain Orientation Right   Pain Descriptors / Indicators Aching   Pain Type Chronic pain   Pain Onset More than a month ago   Pain Frequency Intermittent      PROM into ER  (to 149 degrees afterwards, not as sore) - x 10 minutes through oscillations bringing RUE further into ER as tolerated (initially guarded and painful, but relieving after several minutes of oscillations)  AROM of RUE - to 138 degrees (pain reported at the end range of flexion)   Sidelying shoulder ERs - 2# x 12 for 3 sets -- cuing for keeping elbow on side  Standing ER with yellow t-band x 12 for 2 sets (cuing to keep elbow at side)   UE Ranger x 20 repetitions - through roughly 150-160 degrees of motion, initially painful, with cuing to use bodyweight to move through range pain decreased.   Wall walks (up to 160 degrees, but sore after) x8 repetitions (initially painful, but after several he reports this felt good/relieving)                            PT Education - 09/28/16 1122    Education provided Yes   Education  Details Will continue to progress strength through his improving ROM for symptom relief.    Person(s) Educated Patient   Methods Explanation;Demonstration   Comprehension Verbalized understanding;Returned demonstration             PT Long Term Goals - 09/11/16 1243      PT LONG TERM GOAL #1   Title Patient will demonstrate at least 160 degrees of active shoulder flexion ROM to perform household chores/ADLs.    Baseline 128 degrees    Time 12   Period Weeks   Status New     PT LONG TERM GOAL #2   Title Patient will demonstrate FGA score of greater than 26/30 to demonstrate improved balance and decreased falls risk.    Baseline Did not perform    Time 12   Period Weeks   Status New     PT LONG TERM GOAL #3   Title Patient will report QuickDash score of less than 35% to demonstrate improved tolerance for  ADLs.    Baseline 43.2%   Time 12   Period Weeks   Status New               Plan - 09/28/16 1122    Clinical Impression Statement Patient continues to improve with ER ROM and forward elevation with decreasing levels of pain rreported. He would benefit from additional ER strengthening and ROM specific strengthening as he demonstrates poor activation/isolation of ERs in activities performed in this session.    Rehab Potential Good   PT Frequency 2x / week   PT Duration 12 weeks   PT Treatment/Interventions Aquatic Therapy;Moist Heat;Cryotherapy;Therapeutic exercise;Therapeutic activities;Balance training;Manual techniques;Neuromuscular re-education;Stair training;Gait training;Taping;Dry needling   PT Next Visit Plan ER PROM, RTC strengthening    PT Home Exercise Plan Low rows, thoracic extensions over chair, mid rows   Consulted and Agree with Plan of Care Patient      Patient will benefit from skilled therapeutic intervention in order to improve the following deficits and impairments:  Abnormal gait, Pain, Impaired UE functional use, Decreased strength, Decreased balance, Difficulty walking  Visit Diagnosis: Chronic right shoulder pain  Difficulty in walking, not elsewhere classified  Muscle weakness (generalized)     Problem List Patient Active Problem List   Diagnosis Date Noted  . Acute appendicitis 07/10/2015  . S/P laparoscopic appendectomy 07/10/2015  . Appendicitis, acute   . Diabetes mellitus with complication ([place])   . Chronic congestive heart failure with left ventricular diastolic dysfunction ([place])   . Lactic acidosis   . Acute respiratory failure with hypoxia ([place])    Royce Macadamia PT, DPT, CSCS    09/28/2016, 12:33 PM  [facility] [address] [place], [address] Phone: [phone]   Fax:  [phone]  Name: Larry Terrell MRN: [id number] Date of Birth: May 31, 1961

## 2016-09-28 NOTE — Patient Instructions (Addendum)
PROM into ER  (to 149 degrees afterwards, not as sore)   AROM of RUE - to 138 degrees (pain reported at the end range of flexion)   Sidelying shoulder ERs - 2# x 12 for 3 sets   Standing ER with yellow t-band x 12 for 2 sets (cuing to keep elbow at side)   UE Ranger x 20 repetitions   Wall walks (up to 160 degrees, but sore after)

## 2016-09-30 ENCOUNTER — Encounter: Payer: Medicare Other | Admitting: Physical Therapy

## 2016-10-01 ENCOUNTER — Ambulatory Visit: Payer: Medicare Other | Admitting: Physical Therapy

## 2016-10-06 ENCOUNTER — Ambulatory Visit: Payer: Medicare Other | Admitting: Physical Therapy

## 2016-10-06 DIAGNOSIS — M25511 Pain in right shoulder: Secondary | ICD-10-CM

## 2016-10-06 DIAGNOSIS — R262 Difficulty in walking, not elsewhere classified: Secondary | ICD-10-CM

## 2016-10-06 DIAGNOSIS — G8929 Other chronic pain: Secondary | ICD-10-CM

## 2016-10-06 DIAGNOSIS — M6281 Muscle weakness (generalized): Secondary | ICD-10-CM

## 2016-10-06 NOTE — Patient Instructions (Signed)
PROM - ER initially limited, with prolonged exposure got to 55 degrees of passive ER with soft tissue end feel  Ranger x 15 for 2 sets initially in scapular plane   RUE flexion to 161, L flexion to 171   OMEGA single arm pull down 3x10 @ 5# with R arm only   Seated rows on OMEGA at 15# x 10 , 25# x 8,   Low trap banded raises on wall with foam roller 2 sets x10  Unilateral suitcase carry around clinic with 7#, 10# Dbs

## 2016-10-07 NOTE — Therapy (Signed)
[facility] [address] [place], [address] Phone: [phone]   Fax:  [phone]  Physical Therapy Treatment  Patient Details  Name: Larry Terrell MRN: [id number] Date of Birth: 1960/10/07 No Data Recorded  Encounter Date: 10/06/2016      PT End of Session - 10/06/16 1359    Visit Number 5   Number of Visits 25   Date for PT Re-Evaluation 12/04/16   PT Start Time 2482   PT Stop Time 1435   PT Time Calculation (min) 39 min   Activity Tolerance Patient tolerated treatment well   Behavior During Therapy [facility] for tasks assessed/performed      Past Medical History:  Diagnosis Date  . CHF (congestive heart failure) (HCC)    diastolic (echo at [facility] 5003) EF 55-60%  . Cirrhosis ([place])    NASH per records  . COPD (chronic obstructive pulmonary disease) ([place])   . Depression   . Diabetes mellitus with neuropathy ([place])   . Heart attack   . Neuropathy ([place])   . OSA (obstructive sleep apnea)   . Peripheral vascular disease due to secondary diabetes mellitus ([place])   . Splenomegaly   . Temporal giant cell arteritis ([place])   . TIA (transient ischemic attack) April 2016    Past Surgical History:  Procedure Laterality Date  . AORTA - FEMORAL ARTERY BYPASS GRAFT Bilateral   . CARDIAC CATHETERIZATION    . cardiac stents  July 2015   . LAPAROSCOPIC APPENDECTOMY N/A 07/10/2015   Procedure: APPENDECTOMY LAPAROSCOPIC;  Surgeon: Rolm Bookbinder, MD;  Location: [facility];  Service: General;  Laterality: N/A;    There were no vitals filed for this visit.      Subjective Assessment - 10/06/16 1357    Subjective Patient reports his R shoulder continues to bother him at night, it wakes him up. His L shoulder is feeling fine, occasionally is painful.    Limitations Lifting;House hold activities   Diagnostic tests No imaging to date    Patient Stated Goals Would like to be able to lift his arm better, work on his  balance.    Currently in Pain? Yes   Pain Location Arm   Pain Orientation Right   Pain Descriptors / Indicators Aching   Pain Type Chronic pain   Pain Onset More than a month ago   Pain Frequency Intermittent      PROM - ER initially limited, with prolonged exposure got to 55 degrees of passive ER with soft tissue end feel  Ranger x 15 for 2 sets initially in scapular plane with slight adduction to return to flexion, no pain elicited, excellent ROM through full available range.   RUE flexion to 161, L flexion to 171 -- continues to have mild pain at end range R flexion, no pain with L flexion.   OMEGA single arm pull down 3x10 @ 5# with R arm only   Seated rows on OMEGA at 15# x 10 , 25# x 8 for 2 sets, well tolerated with appropriate mechanics.   Low trap banded raises on wall with foam roller 2 sets x10  Unilateral suitcase carry around clinic with 7#, 10# Dbs (~704 feet, no pain elicited).                            PT Education - 10/06/16 1414    Education provided Yes   Education Details ROM  is progressing nicely, will begin to work on strength then focus on balance deficits.    Person(s) Educated Patient   Methods Explanation;Demonstration;Handout   Comprehension Verbalized understanding;Returned demonstration             PT Long Term Goals - 09/11/16 1243      PT LONG TERM GOAL #1   Title Patient will demonstrate at least 160 degrees of active shoulder flexion ROM to perform household chores/ADLs.    Baseline 128 degrees    Time 12   Period Weeks   Status New     PT LONG TERM GOAL #2   Title Patient will demonstrate FGA score of greater than 26/30 to demonstrate improved balance and decreased falls risk.    Baseline Did not perform    Time 12   Period Weeks   Status New     PT LONG TERM GOAL #3   Title Patient will report QuickDash score of less than 35% to demonstrate improved tolerance for ADLs.    Baseline 43.2%   Time 12    Period Weeks   Status New               Plan - 10/06/16 1414    Clinical Impression Statement Patient reports pain is now not as frequent, though still present. He does have limitations in flexion and ER total ROM still though much improved from baseline. Will need to continue to focus on strengthening through new range of motion, then will begin to focus on balance deficits which are more chronic.    Rehab Potential Good   PT Frequency 2x / week   PT Duration 12 weeks   PT Treatment/Interventions Aquatic Therapy;Moist Heat;Cryotherapy;Therapeutic exercise;Therapeutic activities;Balance training;Manual techniques;Neuromuscular re-education;Stair training;Gait training;Taping;Dry needling   PT Next Visit Plan ER PROM, RTC strengthening    PT Home Exercise Plan Low rows, thoracic extensions over chair, mid rows   Consulted and Agree with Plan of Care Patient      Patient will benefit from skilled therapeutic intervention in order to improve the following deficits and impairments:  Abnormal gait, Pain, Impaired UE functional use, Decreased strength, Decreased balance, Difficulty walking  Visit Diagnosis: Difficulty in walking, not elsewhere classified  Chronic right shoulder pain  Muscle weakness (generalized)     Problem List Patient Active Problem List   Diagnosis Date Noted  . Acute appendicitis 07/10/2015  . S/P laparoscopic appendectomy 07/10/2015  . Appendicitis, acute   . Diabetes mellitus with complication ([place])   . Chronic congestive heart failure with left ventricular diastolic dysfunction ([place])   . Lactic acidosis   . Acute respiratory failure with hypoxia ([place])     Royce Macadamia PT, DPT, CSCS    10/07/2016, 9:55 AM  [facility] [address], [place], [address] Phone: [phone]   Fax:  [phone]  Name: Larry Terrell MRN: [id number] Date of Birth: 12-Feb-1961

## 2016-10-08 ENCOUNTER — Ambulatory Visit: Payer: Medicare Other | Admitting: Physical Therapy

## 2016-10-08 DIAGNOSIS — M25511 Pain in right shoulder: Secondary | ICD-10-CM | POA: Diagnosis not present

## 2016-10-08 DIAGNOSIS — R262 Difficulty in walking, not elsewhere classified: Secondary | ICD-10-CM

## 2016-10-08 DIAGNOSIS — M6281 Muscle weakness (generalized): Secondary | ICD-10-CM

## 2016-10-08 DIAGNOSIS — G8929 Other chronic pain: Secondary | ICD-10-CM

## 2016-10-08 NOTE — Patient Instructions (Addendum)
ER PROM - quite limited initially, but to roughly 45 degrees again with firm soft tissue end feel   AROM flexion on R arm to 171 degrees   ER at side on cable column with 5# and PT assisting in direction/technique x 12 for 3 sets   5x sit to stand without hands - 13.5 seconds

## 2016-10-08 NOTE — Therapy (Signed)
[facility] [address], [place], [address] Phone: [phone]   Fax:  [phone]  Physical Therapy Treatment  Patient Details  Name: Larry Terrell MRN: [id number] Date of Birth: 1961-06-19 No Data Recorded  Encounter Date: 10/08/2016      PT End of Session - 10/08/16 1129    Visit Number 6   Number of Visits 25   Date for PT Re-Evaluation 12/04/16   PT Start Time 1115   PT Stop Time 1155   PT Time Calculation (min) 40 min   Activity Tolerance Patient tolerated treatment well   Behavior During Therapy [facility] for tasks assessed/performed      Past Medical History:  Diagnosis Date  . CHF (congestive heart failure) (HCC)    diastolic (echo at [facility] 1740) EF 55-60%  . Cirrhosis ([place])    NASH per records  . COPD (chronic obstructive pulmonary disease) ([place])   . Depression   . Diabetes mellitus with neuropathy ([place])   . Heart attack   . Neuropathy ([place])   . OSA (obstructive sleep apnea)   . Peripheral vascular disease due to secondary diabetes mellitus ([place])   . Splenomegaly   . Temporal giant cell arteritis ([place])   . TIA (transient ischemic attack) April 2016    Past Surgical History:  Procedure Laterality Date  . AORTA - FEMORAL ARTERY BYPASS GRAFT Bilateral   . CARDIAC CATHETERIZATION    . cardiac stents  July 2015   . LAPAROSCOPIC APPENDECTOMY N/A 07/10/2015   Procedure: APPENDECTOMY LAPAROSCOPIC;  Surgeon: Rolm Bookbinder, MD;  Location: [facility];  Service: General;  Laterality: N/A;    There were no vitals filed for this visit.      Subjective Assessment - 10/08/16 1113    Subjective Patient reports some soreness in his R shoulder, but much better than before therapy. He inquired about PT for balance for him as well and when that would be addressed.    Limitations Lifting;House hold activities   Diagnostic tests No imaging to date    Patient Stated Goals Would like to be able to lift his  arm better, work on his balance.    Currently in Pain? No/denies      ER PROM - quite limited initially, but to roughly 45 degrees again with firm soft tissue end feel   AROM flexion on R arm to 171 degrees   ER at side on cable column with 5# and PT assisting in direction/technique x 12 for 3 sets   5x sit to stand without hands - 13.5 seconds   50mwalk 13.05 seconds. Notable for scuffing feet while ambulating, no dorsiflexion noted or significant hip extension in gait. Provided calf stretching at the wall as part of HEP and instructed patient on how to complete as his calf cramps and scuffing of feet appears related to lack of dorsiflexion.   Functional gait assessment  1- 3 2 -3 3 - 2 4- 2 5 - 3 6 - 3 7 - 1 8 - 1 9 - 3 10- 2 Total - 23/30   BP - 167/69   O2- 98%                           PT Education - 10/08/16 1129    Education provided Yes   Education Details Provided introductory balance HEP with calf stretching included.    Person(s) Educated Patient  Methods Explanation;Demonstration   Comprehension Verbalized understanding;Returned demonstration             PT Long Term Goals - 09/11/16 1243      PT LONG TERM GOAL #1   Title Patient will demonstrate at least 160 degrees of active shoulder flexion ROM to perform household chores/ADLs.    Baseline 128 degrees    Time 12   Period Weeks   Status New     PT LONG TERM GOAL #2   Title Patient will demonstrate FGA score of greater than 26/30 to demonstrate improved balance and decreased falls risk.    Baseline Did not perform    Time 12   Period Weeks   Status New     PT LONG TERM GOAL #3   Title Patient will report QuickDash score of less than 35% to demonstrate improved tolerance for ADLs.    Baseline 43.2%   Time 12   Period Weeks   Status New               Plan - 10/08/16 1129    Clinical Impression Statement Patient has made significant progress with RUE pain  control and ROM, which into flexion is now WNL. He does demonstrate increased balance deficits (13+ second 63mwalk, 23/30 on FGA) indicative of moderate falls risk. He would continue to benefit from ER ROM increase and concomittant strengtening with focus on LE strengthening and functional balance activities in follow up sessions.    Rehab Potential Good   PT Frequency 2x / week   PT Duration 12 weeks   PT Treatment/Interventions Aquatic Therapy;Moist Heat;Cryotherapy;Therapeutic exercise;Therapeutic activities;Balance training;Manual techniques;Neuromuscular re-education;Stair training;Gait training;Taping;Dry needling   PT Next Visit Plan ER PROM, RTC strengthening    PT Home Exercise Plan PROM ER, calf stretching, hip abductions on compliant surface.    Consulted and Agree with Plan of Care Patient      Patient will benefit from skilled therapeutic intervention in order to improve the following deficits and impairments:  Abnormal gait, Pain, Impaired UE functional use, Decreased strength, Decreased balance, Difficulty walking  Visit Diagnosis: Difficulty in walking, not elsewhere classified  Chronic right shoulder pain  Muscle weakness (generalized)     Problem List Patient Active Problem List   Diagnosis Date Noted  . Acute appendicitis 07/10/2015  . S/P laparoscopic appendectomy 07/10/2015  . Appendicitis, acute   . Diabetes mellitus with complication ([place]   . Chronic congestive heart failure with left ventricular diastolic dysfunction ([place]   . Lactic acidosis   . Acute respiratory failure with hypoxia ([place]    PRoyce MacadamiaPT, DPT, CSCS    10/08/2016, 12:40 PM  [facility] [address] [place] [address]Phone: [phone]  Fax:  [phone] Name: TAlcario TinkeyMRN: [id number] of Birth: 410-23-1962

## 2016-10-12 ENCOUNTER — Ambulatory Visit: Payer: Medicare Other | Attending: Psychiatry | Admitting: Physical Therapy

## 2016-10-12 DIAGNOSIS — M6281 Muscle weakness (generalized): Secondary | ICD-10-CM | POA: Diagnosis present

## 2016-10-12 DIAGNOSIS — M25511 Pain in right shoulder: Secondary | ICD-10-CM | POA: Insufficient documentation

## 2016-10-12 DIAGNOSIS — G8929 Other chronic pain: Secondary | ICD-10-CM | POA: Insufficient documentation

## 2016-10-12 DIAGNOSIS — M545 Low back pain: Secondary | ICD-10-CM | POA: Insufficient documentation

## 2016-10-12 DIAGNOSIS — R262 Difficulty in walking, not elsewhere classified: Secondary | ICD-10-CM | POA: Diagnosis not present

## 2016-10-12 NOTE — Therapy (Signed)
[place] [facility] [address] [place], [address] Phone: [phone]   Fax:  [phone]  Physical Therapy Treatment  Patient Details  Name: Larry Terrell MRN: [id number] Date of Birth: 1960/10/21 No Data Recorded  Encounter Date: 10/12/2016      PT End of Session - 10/12/16 1426    Visit Number 7   Number of Visits 25   Date for PT Re-Evaluation 12/04/16   PT Start Time 1351   PT Stop Time 1415   PT Time Calculation (min) 24 min   Activity Tolerance Patient tolerated treatment well   Behavior During Therapy [facility] for tasks assessed/performed      Past Medical History:  Diagnosis Date  . CHF (congestive heart failure) (HCC)    diastolic (echo at [facility] 1700) EF 55-60%  . Cirrhosis ([place])    NASH per records  . COPD (chronic obstructive pulmonary disease) ([place])   . Depression   . Diabetes mellitus with neuropathy ([place])   . Heart attack   . Neuropathy ([place])   . OSA (obstructive sleep apnea)   . Peripheral vascular disease due to secondary diabetes mellitus ([place])   . Splenomegaly   . Temporal giant cell arteritis ([place])   . TIA (transient ischemic attack) April 2016    Past Surgical History:  Procedure Laterality Date  . AORTA - FEMORAL ARTERY BYPASS GRAFT Bilateral   . CARDIAC CATHETERIZATION    . cardiac stents  July 2015   . LAPAROSCOPIC APPENDECTOMY N/A 07/10/2015   Procedure: APPENDECTOMY LAPAROSCOPIC;  Surgeon: Rolm Bookbinder, MD;  Location: [place];  Service: General;  Laterality: N/A;    There were no vitals filed for this visit.      Subjective Assessment - 10/12/16 1411    Subjective Patient reports his shoulder has been feeling better, no falls over the weekend. Reports his shoulder only hurts now with certain movements.    Limitations Lifting;House hold activities   Diagnostic tests No imaging to date    Patient Stated Goals Would like to be able to lift his arm better, work on his  balance.    Currently in Pain? No/denies      PROM - ER up to 50-60 degrees before stiffness/mild pain reported -- performed x 5 minutes of passive low load long duration stretching -- patient reports tolerable though some increase in pain.   AROM flexion - 161 degrees with mild to no pain   Push ups to TM x 2 sets x 10 repetitions (no increase in pain, cued to bring hands in closer to reduce abduction of shoulders)  D1 flexion with Red t-band -- 2 sets x 12 repetitions (felt a stretch/activation in posterior musculature of shoulder, no increase in pain).   Ankle DF mobilizations x 30" bouts x 2 to end range bilaterally -- no pain reported.   Patient able to flex to roughly 170 degrees to complete session with minimal increase in pain.                            PT Education - 10/12/16 1426    Education provided Yes   Education Details Will continue to progress with strengthening next week.    Person(s) Educated Patient   Methods Explanation;Demonstration   Comprehension Verbalized understanding;Returned demonstration             PT Long Term Goals - 09/11/16 1243  PT LONG TERM GOAL #1   Title Patient will demonstrate at least 160 degrees of active shoulder flexion ROM to perform household chores/ADLs.    Baseline 128 degrees    Time 12   Period Weeks   Status New     PT LONG TERM GOAL #2   Title Patient will demonstrate FGA score of greater than 26/30 to demonstrate improved balance and decreased falls risk.    Baseline Did not perform    Time 12   Period Weeks   Status New     PT LONG TERM GOAL #3   Title Patient will report QuickDash score of less than 35% to demonstrate improved tolerance for ADLs.    Baseline 43.2%   Time 12   Period Weeks   Status New               Plan - 10-26-16 1426    Clinical Impression Statement Patient continues to progress nicely with AROM of his shoulder, with resting shoulder pain having  decreased as well. He does continue to have observable weakness in lower and middle trapezius as noted by some superior migration of humerus in forward elevation (likely from upper trap over-activation). He would continue to benefit from skilled PT services to address deficits to allow for increased use of RUE.    Rehab Potential Good   PT Frequency 2x / week   PT Duration 12 weeks   PT Treatment/Interventions Aquatic Therapy;Moist Heat;Cryotherapy;Therapeutic exercise;Therapeutic activities;Balance training;Manual techniques;Neuromuscular re-education;Stair training;Gait training;Taping;Dry needling   PT Next Visit Plan ER PROM, RTC strengthening    PT Home Exercise Plan PROM ER, calf stretching, hip abductions on compliant surface.    Consulted and Agree with Plan of Care Patient      Patient will benefit from skilled therapeutic intervention in order to improve the following deficits and impairments:  Abnormal gait, Pain, Impaired UE functional use, Decreased strength, Decreased balance, Difficulty walking  Visit Diagnosis: Difficulty in walking, not elsewhere classified  Chronic right shoulder pain  Muscle weakness (generalized)       G-Codes - 10/26/16 1426    Functional Assessment Tool Used (Outpatient Only) AROM, Quick Dash    Functional Limitation Carrying, moving and handling objects   Carrying, Moving and Handling Objects Current Status [phone]) At least 1 percent but less than 20 percent impaired, limited or restricted   Carrying, Moving and Handling Objects Goal Status (O1157) At least 1 percent but less than 20 percent impaired, limited or restricted      Problem List Patient Active Problem List   Diagnosis Date Noted  . Acute appendicitis 07/10/2015  . S/P laparoscopic appendectomy 07/10/2015  . Appendicitis, acute   . Diabetes mellitus with complication ([place])   . Chronic congestive heart failure with left ventricular diastolic dysfunction ([place])   . Lactic acidosis    . Acute respiratory failure with hypoxia ([place])    Royce Macadamia PT, DPT, CSCS    10-26-2016, 2:29 PM  [place] [facility] [address], [place], [address] Phone: [phone]   Fax:  [phone]  Name: Larry Terrell MRN: [id number] Date of Birth: 1961/07/05

## 2016-10-12 NOTE — Patient Instructions (Addendum)
PROM - ER up to 50-60 degrees before stiffness/mild pain reported   AROM flexion - 161 degrees with mild to no pain   Push ups to TM x 2 sets   D1 flexion with Red t-band   Ankle DF mobilizations.

## 2016-10-15 ENCOUNTER — Ambulatory Visit: Payer: Medicare Other | Admitting: Physical Therapy

## 2016-10-20 ENCOUNTER — Ambulatory Visit: Payer: Medicare Other | Admitting: Physical Therapy

## 2016-10-20 DIAGNOSIS — M25511 Pain in right shoulder: Secondary | ICD-10-CM

## 2016-10-20 DIAGNOSIS — R262 Difficulty in walking, not elsewhere classified: Secondary | ICD-10-CM | POA: Diagnosis not present

## 2016-10-20 DIAGNOSIS — G8929 Other chronic pain: Secondary | ICD-10-CM

## 2016-10-20 DIAGNOSIS — M6281 Muscle weakness (generalized): Secondary | ICD-10-CM

## 2016-10-20 NOTE — Patient Instructions (Addendum)
AROM - to 160 degrees before it became painful   PROM ER x 5 minutes (initially limited to 40 degrees or so, progressed to roughly 60 degrees)   Scaption - with 2# DB x 12 for 1 set, progressed to 4# DB x 10 repetitions, 6# x 8 repetitions   Shoulder press 1# x10 repetitions for 2 sets (3# DB x 10-- appropriate ROM just fatiguing for him) .   Discussed with patient about low back pain, he reports he has been having it off and on for a while. If he walks too much (5 minutes) he gets low back pain (just above the belt). Reports the pain is on spine. Denies any numbness/tingling associated with this. Denies any pain going into his legs. Reports he will sit down and it resolves. Occasionally he feels his back "pop" in which case he has trouble standing up straight.    TRX Rows -- observed UT x 12, x 10   Standing ER with towel roll between humerus and ribcage x 10 for 2 sets @ 5#   Standing wall slides x 5 for 2 sets with red t-band   Supine bridge test x 8 repetitions (felt appropriately in gluteals, not lower back)

## 2016-10-20 NOTE — Therapy (Signed)
[place] [facility] [address] [place], [address] Phone: [phone]   Fax:  [phone]  Physical Therapy Treatment  Patient Details  Name: Larry Terrell MRN: [id number] Date of Birth: [birth year] No Data Recorded  Encounter Date: 10/20/2016      PT End of Session - 10/20/16 1423    Visit Number 8   Number of Visits 25   Date for PT Re-Evaluation 12/04/16   PT Start Time 8675   PT Stop Time 1430   PT Time Calculation (min) 42 min   Activity Tolerance Patient tolerated treatment well   Behavior During Therapy [facility] for tasks assessed/performed      Past Medical History:  Diagnosis Date  . CHF (congestive heart failure) (HCC)    diastolic (echo at [facility] 4492) EF 55-60%  . Cirrhosis ([place])    NASH per records  . COPD (chronic obstructive pulmonary disease) ([place])   . Depression   . Diabetes mellitus with neuropathy ([place])   . Heart attack   . Neuropathy ([place])   . OSA (obstructive sleep apnea)   . Peripheral vascular disease due to secondary diabetes mellitus ([place])   . Splenomegaly   . Temporal giant cell arteritis ([place])   . TIA (transient ischemic attack) April 2016    Past Surgical History:  Procedure Laterality Date  . AORTA - FEMORAL ARTERY BYPASS GRAFT Bilateral   . CARDIAC CATHETERIZATION    . cardiac stents  July 2015   . LAPAROSCOPIC APPENDECTOMY N/A 07/10/2015   Procedure: APPENDECTOMY LAPAROSCOPIC;  Surgeon: Rolm Bookbinder, MD;  Location: [place];  Service: General;  Laterality: N/A;    There were no vitals filed for this visit.      Subjective Assessment - 10/20/16 1355    Subjective Patient reports he has noticed he can now have his hand over his head while walking the dogs (which he could not do prior to therapy).    Limitations Lifting;House hold activities   Diagnostic tests No imaging to date    Patient Stated Goals Would like to be able to lift his arm better, work on his balance.     Currently in Pain? Other (Comment)  OH flexion causes lateral shoulder pain      AROM - to 160 degrees before it became painful   PROM ER x 5 minutes (initially limited to 40 degrees or so, progressed to roughly 60 degrees)   Scaption - with 2# DB x 12 for 1 set, progressed to 4# DB x 10 repetitions, 6# x 8 repetitions   Shoulder press 1# x10 repetitions for 2 sets (3# DB x 10-- appropriate ROM just fatiguing for him) .   Discussed with patient about low back pain, he reports he has been having it off and on for a while. If he walks too much (5 minutes) he gets low back pain (just above the belt). Reports the pain is on spine. Denies any numbness/tingling associated with this. Denies any pain going into his legs. Reports he will sit down and it resolves. Occasionally he feels his back "pop" in which case he has trouble standing up straight.    TRX Rows -- observed UT substitution on RUE (provided manual and verbal cuing to reduce) x 12, x 10   Standing ER with towel roll between humerus and ribcage x 10 for 2 sets @ 5#   Standing wall slides x 5 for 2 sets with red t-band   Supine  bridge test x 8 repetitions (felt appropriately in gluteals, not lower back)                             PT Education - 10/20/16 1422    Education provided Yes   Education Details Will begin to address low back pain referral as shoulder strengthening program is more developed.    Person(s) Educated Patient   Methods Demonstration;Explanation   Comprehension Verbalized understanding;Returned demonstration             PT Long Term Goals - 09/11/16 1243      PT LONG TERM GOAL #1   Title Patient will demonstrate at least 160 degrees of active shoulder flexion ROM to perform household chores/ADLs.    Baseline 128 degrees    Time 12   Period Weeks   Status New     PT LONG TERM GOAL #2   Title Patient will demonstrate FGA score of greater than 26/30 to demonstrate improved  balance and decreased falls risk.    Baseline Did not perform    Time 12   Period Weeks   Status New     PT LONG TERM GOAL #3   Title Patient will report QuickDash score of less than 35% to demonstrate improved tolerance for ADLs.    Baseline 43.2%   Time 12   Period Weeks   Status New               Plan - 10/20/16 1430    Clinical Impression Statement Patient continues to demonstrate appropriate flexion ROM, though limitation in ER PROM is likely still contributing to his symptoms. He tolerated all loading quite well, and appears to have strength and ROM deficits leading to his continued complaints of shoulder pain. He is reporting improved function in RUE, appears to be having pain less consistently. Will begin to address referral for back pain and balance in subsequent sessions.    Rehab Potential Good   PT Frequency 2x / week   PT Duration 12 weeks   PT Treatment/Interventions Aquatic Therapy;Moist Heat;Cryotherapy;Therapeutic exercise;Therapeutic activities;Balance training;Manual techniques;Neuromuscular re-education;Stair training;Gait training;Taping;Dry needling   PT Next Visit Plan ER PROM, RTC strengthening    PT Home Exercise Plan PROM ER, calf stretching, hip abductions on compliant surface.    Consulted and Agree with Plan of Care Patient      Patient will benefit from skilled therapeutic intervention in order to improve the following deficits and impairments:  Abnormal gait, Pain, Impaired UE functional use, Decreased strength, Decreased balance, Difficulty walking  Visit Diagnosis: Difficulty in walking, not elsewhere classified  Muscle weakness (generalized)  Chronic right shoulder pain     Problem List Patient Active Problem List   Diagnosis Date Noted  . Acute appendicitis 07/10/2015  . S/P laparoscopic appendectomy 07/10/2015  . Appendicitis, acute   . Diabetes mellitus with complication ([place])   . Chronic congestive heart failure with left  ventricular diastolic dysfunction ([place])   . Lactic acidosis   . Acute respiratory failure with hypoxia ([place])    Royce Macadamia PT, DPT, CSCS    10/20/2016, 3:11 PM  [place] [facility] [address] [place], [address] Phone: [phone]   Fax:  [phone]  Name: Saahil Herbster MRN: [id number] Date of Birth: 1960/08/20

## 2016-10-22 ENCOUNTER — Ambulatory Visit: Payer: Medicare Other | Admitting: Physical Therapy

## 2016-10-22 DIAGNOSIS — R262 Difficulty in walking, not elsewhere classified: Secondary | ICD-10-CM

## 2016-10-22 DIAGNOSIS — M6281 Muscle weakness (generalized): Secondary | ICD-10-CM

## 2016-10-22 DIAGNOSIS — G8929 Other chronic pain: Secondary | ICD-10-CM

## 2016-10-22 DIAGNOSIS — M25511 Pain in right shoulder: Secondary | ICD-10-CM

## 2016-10-22 NOTE — Therapy (Signed)
[place] [facility] [address] [place], [address] Phone: [phone]   Fax:  [phone]  Physical Therapy Treatment  Patient Details  Name: Larry Terrell MRN: [id number] Date of Birth: Sep 06, 1960 No Data Recorded  Encounter Date: 10/22/2016      PT End of Session - 10/22/16 1514    Visit Number 9   Number of Visits 25   Date for PT Re-Evaluation 12/04/16   PT Start Time 1062   PT Stop Time 1505   PT Time Calculation (min) 50 min   Activity Tolerance Patient tolerated treatment well   Behavior During Therapy [facility] for tasks assessed/performed      Past Medical History:  Diagnosis Date  . CHF (congestive heart failure) (HCC)    diastolic (echo at [facility] 6948) EF 55-60%  . Cirrhosis ([place])    NASH per records  . COPD (chronic obstructive pulmonary disease) ([place])   . Depression   . Diabetes mellitus with neuropathy ([place])   . Heart attack   . Neuropathy ([place])   . OSA (obstructive sleep apnea)   . Peripheral vascular disease due to secondary diabetes mellitus ([place])   . Splenomegaly   . Temporal giant cell arteritis ([place])   . TIA (transient ischemic attack) April 2016    Past Surgical History:  Procedure Laterality Date  . AORTA - FEMORAL ARTERY BYPASS GRAFT Bilateral   . CARDIAC CATHETERIZATION    . cardiac stents  July 2015   . LAPAROSCOPIC APPENDECTOMY N/A 07/10/2015   Procedure: APPENDECTOMY LAPAROSCOPIC;  Surgeon: Rolm Bookbinder, MD;  Location: [place];  Service: General;  Laterality: N/A;    There were no vitals filed for this visit.      Subjective Assessment - 10/22/16 1423    Subjective Patient reports he continues to notice improvement in his shoulder, though still has pain in it with certain movements (particularly when it is at the top of his ROM). Reports no pain at rest.    Limitations Lifting;House hold activities   Diagnostic tests No imaging to date    Patient Stated Goals Would  like to be able to lift his arm better, work on his balance.    Currently in Pain? No/denies      Lumbar screen R side flexion painful  L side flexion - not painful Flexion mild pain, none with extension  Rotations- not painful   Slump test- negative bilaterally  SLR- negative bilaterally   Hip IR - minimal but no pain Hip ER - WNL possibly up to 60 degrees, felt relieving  Supine bridge -- fatigued in thighs, mild pain/reproduction of symptoms in her lumbar spine.  HS- negative, but limited ROM ~ 30 degrees from vertical   PROM ER to 65 degrees   CPAs to Lumbar spine pain most at L3/L4 -- performed grade II mobilizations x 30" x 3 repetitions at each painful segment Deadlifts with 20# -- pain reproduced in his lumbar spine initially, likely due to excessive trunkal flexion, used video feedback and cuing to instruct patient in increasing hip hinging/flexion and decreasing lumbar flexion which reduced his symptoms in his lumbar spine.  Single arm pulldowns x 10 repetitions at 5# (10# was too intense)-- 2 sets x 8 repetitions with no increase in discomfort.                            PT Education - 10/22/16 1514  Education provided Yes   Education Details Midwife, and will begin to focus on shoulder strengthening and lifting mechanics.    Person(s) Educated Patient   Methods Explanation;Demonstration   Comprehension Verbalized understanding;Returned demonstration             PT Long Term Goals - 09/11/16 1243      PT LONG TERM GOAL #1   Title Patient will demonstrate at least 160 degrees of active shoulder flexion ROM to perform household chores/ADLs.    Baseline 128 degrees    Time 12   Period Weeks   Status New     PT LONG TERM GOAL #2   Title Patient will demonstrate FGA score of greater than 26/30 to demonstrate improved balance and decreased falls risk.    Baseline Did not perform    Time 12   Period Weeks   Status New      PT LONG TERM GOAL #3   Title Patient will report QuickDash score of less than 35% to demonstrate improved tolerance for ADLs.    Baseline 43.2%   Time 12   Period Weeks   Status New               Plan - 10/22/16 1515    Clinical Impression Statement Patient continues to improve with ER PROM and subsequently with pain reported during overhead activities. He was screened for his low back pain referral on this date, pain was reproduced with mobilizations at L3/L4 and initially with deadlift set up due to excessive lumbar flexion. Patient demonstrates decreased strength in LEs as well as endurance which are likely contributing to his reports of low back pain.    Rehab Potential Good   PT Frequency 2x / week   PT Duration 12 weeks   PT Treatment/Interventions Aquatic Therapy;Moist Heat;Cryotherapy;Therapeutic exercise;Therapeutic activities;Balance training;Manual techniques;Neuromuscular re-education;Stair training;Gait training;Taping;Dry needling   PT Next Visit Plan ER PROM, RTC strengthening    PT Home Exercise Plan PROM ER, calf stretching, hip abductions on compliant surface.    Consulted and Agree with Plan of Care Patient      Patient will benefit from skilled therapeutic intervention in order to improve the following deficits and impairments:  Abnormal gait, Pain, Impaired UE functional use, Decreased strength, Decreased balance, Difficulty walking  Visit Diagnosis: Difficulty in walking, not elsewhere classified  Muscle weakness (generalized)  Chronic right shoulder pain     Problem List Patient Active Problem List   Diagnosis Date Noted  . Acute appendicitis 07/10/2015  . S/P laparoscopic appendectomy 07/10/2015  . Appendicitis, acute   . Diabetes mellitus with complication ([place])   . Chronic congestive heart failure with left ventricular diastolic dysfunction ([place])   . Lactic acidosis   . Acute respiratory failure with hypoxia ([place])    Royce Macadamia PT,  DPT, CSCS    10/22/2016, 5:12 PM  [place] [facility] [address], [place], [address] Phone: [phone]   Fax:  [phone]  Name: Zamari Vea MRN: [id number] Date of Birth: 1961/02/14

## 2016-10-22 NOTE — Patient Instructions (Addendum)
Lumbar screen R side flexion painful  L side flexion - not painful Flexion mild pain, none with extension  Rotations- not painful   Slump test- negative bilaterally  SLR- negative bilaterally   Hip IR - minimal but no pain Hip ER - WNL possibly up to 60 degrees, felt relieving  Supine bridge  HS- negative, but limited ROM ~ 30 degrees from vertical   PROM ER to 65 degrees   CPAs to Lumbar spine pain most at L3/L4  Deadlifts with 20# Single arm pulldowns x 10 repetitions at 5# (10# was too intense)

## 2016-10-28 ENCOUNTER — Ambulatory Visit: Payer: Medicare Other | Admitting: Physical Therapy

## 2016-11-04 ENCOUNTER — Ambulatory Visit: Payer: Medicare Other | Admitting: Physical Therapy

## 2016-11-04 DIAGNOSIS — R262 Difficulty in walking, not elsewhere classified: Secondary | ICD-10-CM

## 2016-11-04 DIAGNOSIS — G8929 Other chronic pain: Secondary | ICD-10-CM

## 2016-11-04 DIAGNOSIS — M25511 Pain in right shoulder: Secondary | ICD-10-CM

## 2016-11-04 DIAGNOSIS — M545 Low back pain: Secondary | ICD-10-CM

## 2016-11-04 DIAGNOSIS — M6281 Muscle weakness (generalized): Secondary | ICD-10-CM

## 2016-11-04 NOTE — Therapy (Signed)
[facility] [address] [place], [address] Phone: [phone]   Fax:  [phone]  Physical Therapy Treatment  Patient Details  Name: Larry Terrell MRN: [id number] Date of Birth: 06-18-1961 No Data Recorded  Encounter Date: 11/04/2016      PT End of Session - 11/04/16 0954    Visit Number 10   Number of Visits 25   Date for PT Re-Evaluation 12/16/16   PT Start Time 0950   PT Stop Time 1030   PT Time Calculation (min) 40 min   Activity Tolerance Patient tolerated treatment well   Behavior During Therapy [facility] for tasks assessed/performed      Past Medical History:  Diagnosis Date  . CHF (congestive heart failure) (HCC)    diastolic (echo at [facility] 9485) EF 55-60%  . Cirrhosis ([place])    NASH per records  . COPD (chronic obstructive pulmonary disease) ([place])   . Depression   . Diabetes mellitus with neuropathy ([place])   . Heart attack   . Neuropathy ([place])   . OSA (obstructive sleep apnea)   . Peripheral vascular disease due to secondary diabetes mellitus ([place])   . Splenomegaly   . Temporal giant cell arteritis ([place])   . TIA (transient ischemic attack) April 2016    Past Surgical History:  Procedure Laterality Date  . AORTA - FEMORAL ARTERY BYPASS GRAFT Bilateral   . CARDIAC CATHETERIZATION    . cardiac stents  July 2015   . LAPAROSCOPIC APPENDECTOMY N/A 07/10/2015   Procedure: APPENDECTOMY LAPAROSCOPIC;  Surgeon: Rolm Bookbinder, MD;  Location: [facility];  Service: General;  Laterality: N/A;    There were no vitals filed for this visit.      Subjective Assessment - 11/04/16 0951    Subjective Patient reports he had a toothache and was not able to be at last week's session. He reports his shoulder is feeling much better, but his back has been off and on. Reports it bothers him the most when he first wakes up and with prolonged walking.    Limitations Lifting;House hold activities   Diagnostic  tests No imaging to date    Patient Stated Goals Would like to be able to lift his arm better, work on his balance.    Currently in Pain? Other (Comment)  Reports he can feel his R shoulder, but wouldn't call it a "hurt"       Quick Dash - 18.2%  AROM initially in flexion - 132 degrees   PROM ER ROM initially limited to 45-50 degrees, with stretching/overpressure he is able to get to roughly 55 degrees.   AROM in flexion to 156 degrees   Extension - felt good in standing.   No pain with rotations   Flexion felt discomfort (mild) on the way down and the way up.   Standing hip machine x 10 bilaterally for 2 sets x 10 (felt mild pain initially in side, which subsided, otherwise appropriate)   BOSU Squats (blue side down) -- difficulty with spreading his feet apart to hip width, but able to complete x 10 repetitions   BOSU lateral step ups (Blue side up) - 2 sets x 10 repetitions   1/2 kneeling hip flexor stretch x 10 per side for 3" holds (felt appropriately)                             PT Education - 11/04/16  35    Education provided Yes   Education Details Updated home exercise program, emphasized the need for increasing ER ROM.    Person(s) Educated Patient   Methods Explanation;Demonstration;Handout   Comprehension Verbalized understanding;Returned demonstration             PT Long Term Goals - 11/04/16 0954      PT LONG TERM GOAL #1   Title Patient will demonstrate at least 160 degrees of active shoulder flexion ROM to perform household chores/ADLs.    Baseline 128 degrees  -- 156 degrees on 4/25   Time 12   Period Weeks   Status On-going     PT LONG TERM GOAL #2   Title Patient will demonstrate FGA score of greater than 26/30 to demonstrate improved balance and decreased falls risk.    Baseline Did not perform    Time 12   Period Weeks   Status Achieved     PT LONG TERM GOAL #3   Title Patient will report QuickDash score of less  than 35% to demonstrate improved tolerance for ADLs.    Baseline 43.2% -- 18.2%   Time 12   Period Weeks   Status Achieved     PT LONG TERM GOAL #4   Title Patient will report no more than 1/10 back pain for at least 3 consecutive days to demonstrate improved tolerance for ADLs.    Time 6   Period Weeks   Status New               Plan - 11/04/16 3086    Clinical Impression Statement Patient initially demonstrates decreased ER PROM and AROM into flexion, with ER PROM stretching he demonstrates improved flexion ROM. He tolerates loading of LEs well this date, though does report mild hip pain while performing. He would benefit from ROM increase on R hip in particular as well as global LE strengthening/proprioceptive work. Patient reports significant increase in function and reduction in pain in RUE.    Rehab Potential Good   PT Frequency 2x / week   PT Duration 12 weeks   PT Treatment/Interventions Aquatic Therapy;Moist Heat;Cryotherapy;Therapeutic exercise;Therapeutic activities;Balance training;Manual techniques;Neuromuscular re-education;Stair training;Gait training;Taping;Dry needling   PT Next Visit Plan ER PROM, RTC strengthening    PT Home Exercise Plan PROM ER, calf stretching, hip abductions on compliant surface.    Consulted and Agree with Plan of Care Patient      Patient will benefit from skilled therapeutic intervention in order to improve the following deficits and impairments:  Abnormal gait, Pain, Impaired UE functional use, Decreased strength, Decreased balance, Difficulty walking  Visit Diagnosis: Difficulty in walking, not elsewhere classified - Plan: PT plan of care cert/re-cert  Muscle weakness (generalized) - Plan: PT plan of care cert/re-cert  Chronic right shoulder pain - Plan: PT plan of care cert/re-cert  Chronic midline low back pain without sciatica - Plan: PT plan of care cert/re-cert     Problem List Patient Active Problem List   Diagnosis  Date Noted  . Acute appendicitis 07/10/2015  . S/P laparoscopic appendectomy 07/10/2015  . Appendicitis, acute   . Diabetes mellitus with complication ([place])   . Chronic congestive heart failure with left ventricular diastolic dysfunction ([place])   . Lactic acidosis   . Acute respiratory failure with hypoxia ([place])    Royce Macadamia PT, DPT, CSCS    11/04/2016, 10:52 AM  [facility] [address] [place], [address] Phone:  [phone]   Fax:  [phone]  Name: Larry Terrell MRN: [id number] Date of Birth: 1961-07-02

## 2016-11-04 NOTE — Patient Instructions (Addendum)
Quick Dash - 18.2%  AROM initially in flexion - 132 degrees   PROM ER ROM   AROM in flexion to 156 degrees   Extension - felt good  No pain with rotations   Flexion felt discomfort (mild) on the way down and the way up.   Standing hip machine x 10 bilaterally for 2 sets x 10 (felt mild pain initially in side, which subsided, otherwise appropriate)   BOSU Squats (blue side down)   BOSU lateral step ups (Blue side up)

## 2016-11-11 ENCOUNTER — Ambulatory Visit: Payer: Medicare Other | Attending: Psychiatry | Admitting: Physical Therapy

## 2016-11-11 DIAGNOSIS — G8929 Other chronic pain: Secondary | ICD-10-CM | POA: Diagnosis present

## 2016-11-11 DIAGNOSIS — M6281 Muscle weakness (generalized): Secondary | ICD-10-CM | POA: Insufficient documentation

## 2016-11-11 DIAGNOSIS — M545 Low back pain: Secondary | ICD-10-CM | POA: Diagnosis present

## 2016-11-11 DIAGNOSIS — M25511 Pain in right shoulder: Secondary | ICD-10-CM | POA: Diagnosis present

## 2016-11-11 DIAGNOSIS — R262 Difficulty in walking, not elsewhere classified: Secondary | ICD-10-CM | POA: Insufficient documentation

## 2016-11-11 NOTE — Therapy (Signed)
[name] [address], [place], [address] Phone: [phone]   Fax:  [phone]  Physical Therapy Treatment  Patient Details  Name: Larry Terrell MRN: [id number] Date of Birth: 01-04-1961 No Data Recorded  Encounter Date: 11/11/2016      PT End of Session - 11/11/16 1057    Visit Number 11   Number of Visits 25   Date for PT Re-Evaluation 12/16/16   PT Start Time 1039   PT Stop Time 1117   PT Time Calculation (min) 38 min   Activity Tolerance Patient tolerated treatment well   Behavior During Therapy [facility] for tasks assessed/performed      Past Medical History:  Diagnosis Date  . CHF (congestive heart failure) (HCC)    diastolic (echo at [facility] 2836) EF 55-60%  . Cirrhosis ([place])    NASH per records  . COPD (chronic obstructive pulmonary disease) ([place])   . Depression   . Diabetes mellitus with neuropathy ([place])   . Heart attack   . Neuropathy ([place])   . OSA (obstructive sleep apnea)   . Peripheral vascular disease due to secondary diabetes mellitus ([place])   . Splenomegaly   . Temporal giant cell arteritis ([place])   . TIA (transient ischemic attack) April 2016    Past Surgical History:  Procedure Laterality Date  . AORTA - FEMORAL ARTERY BYPASS GRAFT Bilateral   . CARDIAC CATHETERIZATION    . cardiac stents  July 2015   . LAPAROSCOPIC APPENDECTOMY N/A 07/10/2015   Procedure: APPENDECTOMY LAPAROSCOPIC;  Surgeon: Rolm Bookbinder, MD;  Location: [facility];  Service: General;  Laterality: N/A;    There were no vitals filed for this visit.      Subjective Assessment - 11/11/16 1040    Subjective Patient reports his shoulder has been feeling better ( he has been able to lift it over his head). He reports his back "comes and goes" but is largely tied to standing for too long.    Limitations Lifting;House hold activities   Diagnostic tests No imaging to date    Patient Stated Goals Would like to be  able to lift his arm better, work on his balance.    Currently in Pain? Other (Comment)      BP - 152/69 HR 61  TRX squats x 12 -- 15 on second set   Standing hip abduction -55# x12 repetitions for 2 sets   Single leg bridging x 5 per side for 2 sets   Performed passive ER ROM to initially 5 degrees, increased to 50 degrees passively after several minutes   Shoulder flexion to 153 degrees (mild pain over lateral and posterior shoulder)   Side stepping with green t-band at ankles x 14 for 2 sets bilaterally (felt pain/fatigue in his calves)                            PT Education - 11/11/16 1048    Education provided Yes   Education Details Will begin to focus more on lumbar pain, needs to continue passive ER stretching.    Person(s) Educated Patient   Methods Explanation;Demonstration   Comprehension Verbalized understanding;Returned demonstration             PT Long Term Goals - 11/04/16 0954      PT LONG TERM GOAL #1   Title Patient will demonstrate at least 160 degrees of active shoulder flexion ROM  to perform household chores/ADLs.    Baseline 128 degrees  -- 156 degrees on 4/25   Time 12   Period Weeks   Status On-going     PT LONG TERM GOAL #2   Title Patient will demonstrate FGA score of greater than 26/30 to demonstrate improved balance and decreased falls risk.    Baseline Did not perform    Time 12   Period Weeks   Status Achieved     PT LONG TERM GOAL #3   Title Patient will report QuickDash score of less than 35% to demonstrate improved tolerance for ADLs.    Baseline 43.2% -- 18.2%   Time 12   Period Weeks   Status Achieved     PT LONG TERM GOAL #4   Title Patient will report no more than 1/10 back pain for at least 3 consecutive days to demonstrate improved tolerance for ADLs.    Time 6   Period Weeks   Status New               Plan - 11/11/16 1057    Clinical Impression Statement Patient reports significant  improvement in RUE symptoms, though still limited with flexion and ER combination. His passive ER continues to be limited, but improves throughout the session. He is doing well with progressive resistance training, targeting major LE musculature. Patient will be progressed with strengthening as indicated.    Rehab Potential Good   PT Frequency 2x / week   PT Duration 12 weeks   PT Treatment/Interventions Aquatic Therapy;Moist Heat;Cryotherapy;Therapeutic exercise;Therapeutic activities;Balance training;Manual techniques;Neuromuscular re-education;Stair training;Gait training;Taping;Dry needling   PT Next Visit Plan ER PROM, RTC strengthening    PT Home Exercise Plan PROM ER, calf stretching, hip abductions on compliant surface.    Consulted and Agree with Plan of Care Patient      Patient will benefit from skilled therapeutic intervention in order to improve the following deficits and impairments:  Abnormal gait, Pain, Impaired UE functional use, Decreased strength, Decreased balance, Difficulty walking  Visit Diagnosis: Difficulty in walking, not elsewhere classified  Muscle weakness (generalized)  Chronic right shoulder pain  Chronic midline low back pain without sciatica     Problem List Patient Active Problem List   Diagnosis Date Noted  . Acute appendicitis 07/10/2015  . S/P laparoscopic appendectomy 07/10/2015  . Appendicitis, acute   . Diabetes mellitus with complication ([place])   . Chronic congestive heart failure with left ventricular diastolic dysfunction ([place])   . Lactic acidosis   . Acute respiratory failure with hypoxia ([place])    Royce Macadamia PT, DPT, CSCS    11/11/2016, 3:05 PM  [facility] [address], [place], [address] Phone: [phone]   Fax:  [phone]  Name: Larry Terrell MRN: [id number] Date of Birth: 1961/02/05

## 2016-11-11 NOTE — Patient Instructions (Addendum)
BP - 152/69 HR 61  TRX squats x 12 -- 15 on second set   Standing hip abduction -55# x12 repetitions for 2 sets   Single leg bridging x 5 per side for 2 sets   Performed passive ER ROM to initially 5 degrees, increased to 50 degrees passively after several minutes   Shoulder flexion to 153 degrees (mild pain over lateral and posterior shoulder)   Side stepping with green t-band at ankles x 14 for 2 sets bilaterally (felt pain/fatigue in his calves)

## 2016-11-17 ENCOUNTER — Ambulatory Visit: Payer: Medicare Other | Admitting: Physical Therapy

## 2016-11-17 ENCOUNTER — Encounter: Payer: Self-pay | Admitting: Physical Therapy

## 2016-11-17 DIAGNOSIS — M25511 Pain in right shoulder: Secondary | ICD-10-CM

## 2016-11-17 DIAGNOSIS — R262 Difficulty in walking, not elsewhere classified: Secondary | ICD-10-CM

## 2016-11-17 DIAGNOSIS — M545 Low back pain, unspecified: Secondary | ICD-10-CM

## 2016-11-17 DIAGNOSIS — G8929 Other chronic pain: Secondary | ICD-10-CM

## 2016-11-17 DIAGNOSIS — M6281 Muscle weakness (generalized): Secondary | ICD-10-CM

## 2016-11-17 NOTE — Therapy (Signed)
[facility] [address] [place], [address] Phone: [phone]   Fax:  [phone]  Physical Therapy Treatment  Patient Details  Name: Larry Terrell MRN: [id number] Date of Birth: 08-Jul-1961 No Data Recorded  Encounter Date: 11/17/2016      PT End of Session - 11/17/16 1027    Visit Number 12   Number of Visits 25   Date for PT Re-Evaluation 12/16/16   PT Start Time 1027   PT Stop Time 1114   PT Time Calculation (min) 47 min   Activity Tolerance Patient tolerated treatment well   Behavior During Therapy [facility] for tasks assessed/performed      Past Medical History:  Diagnosis Date  . CHF (congestive heart failure) (HCC)    diastolic (echo at [facility] 9767) EF 55-60%  . Cirrhosis ([place])    NASH per records  . COPD (chronic obstructive pulmonary disease) ([place])   . Depression   . Diabetes mellitus with neuropathy ([place])   . Heart attack ([place])   . Neuropathy   . OSA (obstructive sleep apnea)   . Peripheral vascular disease due to secondary diabetes mellitus ([place])   . Splenomegaly   . Temporal giant cell arteritis ([place])   . TIA (transient ischemic attack) April 2016    Past Surgical History:  Procedure Laterality Date  . AORTA - FEMORAL ARTERY BYPASS GRAFT Bilateral   . CARDIAC CATHETERIZATION    . cardiac stents  July 2015   . LAPAROSCOPIC APPENDECTOMY N/A 07/10/2015   Procedure: APPENDECTOMY LAPAROSCOPIC;  Surgeon: Rolm Bookbinder, MD;  Location: [facility];  Service: General;  Laterality: N/A;    There were no vitals filed for this visit.      Subjective Assessment - 11/17/16 1030    Subjective Pt reports no new changes and denies pain.  He reports he has been completing his HEP without any questions or concerns.  Still has some pain when reaching RUE overhead.   Limitations Lifting;House hold activities   Diagnostic tests No imaging to date    Patient Stated Goals Would like to be able to lift his  arm better, work on his balance.    Currently in Pain? No/denies   Multiple Pain Sites No       Therapeutic Exercise:   Passive stretching R shoulder ER with pt in supine 6x30 seconds  R shoulder ER MET with 5 second holds x10  R shoulder ER with YTB x10 at 90 deg abduction and 2x10 120 deg abduction  Paloff press 10# 1x10 and 15# 1x10  Total gym BLE squats at level 20 x10 and at level 16 x15  TRX squats x11 (pt fatigues and was unable to reach 15 reps) and again x10 to fatigue. He requires a seated rest break after each set.  BLE leg press 55# x13 and again x15. Challenging for pt.  Forward lunge on BOSU x15 each LE with UE support. Cues to decrease support through UE for improved control.  Deadlift with 10# weight with demonstration and cues for correct technique             PT Education - 11/17/16 1027    Education provided Yes   Education Details Exercise technique; clinical reasoning behind interventions   Person(s) Educated Patient   Methods Explanation;Demonstration   Comprehension Verbalized understanding;Returned demonstration;Verbal cues required;Need further instruction             PT Long Term Goals - 11/04/16 3419  PT LONG TERM GOAL #1   Title Patient will demonstrate at least 160 degrees of active shoulder flexion ROM to perform household chores/ADLs.    Baseline 128 degrees  -- 156 degrees on 4/25   Time 12   Period Weeks   Status On-going     PT LONG TERM GOAL #2   Title Patient will demonstrate FGA score of greater than 26/30 to demonstrate improved balance and decreased falls risk.    Baseline Did not perform    Time 12   Period Weeks   Status Achieved     PT LONG TERM GOAL #3   Title Patient will report QuickDash score of less than 35% to demonstrate improved tolerance for ADLs.    Baseline 43.2% -- 18.2%   Time 12   Period Weeks   Status Achieved     PT LONG TERM GOAL #4   Title Patient will report no more than 1/10 back pain  for at least 3 consecutive days to demonstrate improved tolerance for ADLs.    Time 6   Period Weeks   Status New               Plan - 11/17/16 1054    Clinical Impression Statement Pt tolerated R shoulder ER stretching and MET with improved ROM following.  He was able to tolerate R shoulder ER strengthening at 90 deg Abd but with increased difficulty at 120 deg Abd.  He fatigues when performing BLE strengthening exercises and requires seated rest breaks.  He will benefit from skilled PT interventions for improved strength, ROM, posture, and endurance.   Rehab Potential Good   PT Frequency 2x / week   PT Duration 12 weeks   PT Treatment/Interventions Aquatic Therapy;Moist Heat;Cryotherapy;Therapeutic exercise;Therapeutic activities;Balance training;Manual techniques;Neuromuscular re-education;Stair training;Gait training;Taping;Dry needling   PT Next Visit Plan ER PROM, RTC strengthening    PT Home Exercise Plan PROM ER, calf stretching, hip abductions on compliant surface.    Consulted and Agree with Plan of Care Patient      Patient will benefit from skilled therapeutic intervention in order to improve the following deficits and impairments:  Abnormal gait, Pain, Impaired UE functional use, Decreased strength, Decreased balance, Difficulty walking  Visit Diagnosis: Difficulty in walking, not elsewhere classified  Muscle weakness (generalized)  Chronic right shoulder pain  Chronic midline low back pain without sciatica     Problem List Patient Active Problem List   Diagnosis Date Noted  . Acute appendicitis 07/10/2015  . S/P laparoscopic appendectomy 07/10/2015  . Appendicitis, acute   . Diabetes mellitus with complication ([place])   . Chronic congestive heart failure with left ventricular diastolic dysfunction ([place])   . Lactic acidosis   . Acute respiratory failure with hypoxia ([place])     Collie Siad PT, DPT 11/17/2016, 11:14 AM  [facility] [address] [place], [address] Phone: [phone]   Fax:  [phone]  Name: Larry Terrell MRN: [id number] Date of Birth: 07-Sep-1960

## 2016-11-19 ENCOUNTER — Ambulatory Visit: Payer: Medicare Other | Admitting: Physical Therapy

## 2016-11-24 ENCOUNTER — Ambulatory Visit: Payer: Medicare Other | Admitting: Physical Therapy

## 2016-11-26 ENCOUNTER — Ambulatory Visit: Payer: Medicare Other | Admitting: Physical Therapy

## 2016-12-01 ENCOUNTER — Ambulatory Visit: Payer: Medicare Other | Admitting: Physical Therapy

## 2016-12-03 ENCOUNTER — Ambulatory Visit: Payer: Medicare Other | Admitting: Physical Therapy

## 2016-12-08 ENCOUNTER — Encounter: Payer: Medicare Other | Admitting: Physical Therapy

## 2016-12-10 ENCOUNTER — Encounter: Payer: Medicare Other | Admitting: Physical Therapy

## 2017-08-06 ENCOUNTER — Encounter: Payer: Self-pay | Admitting: Emergency Medicine

## 2017-08-06 ENCOUNTER — Other Ambulatory Visit: Payer: Self-pay

## 2017-08-06 ENCOUNTER — Inpatient Hospital Stay
Admission: EM | Admit: 2017-08-06 | Discharge: 2017-08-10 | DRG: 247 | Disposition: A | Discharge status: Home / Self Care | Payer: Medicare Other | Attending: Internal Medicine | Admitting: Internal Medicine

## 2017-08-06 ENCOUNTER — Emergency Department: Payer: Medicare Other

## 2017-08-06 DIAGNOSIS — Z8673 Personal history of transient ischemic attack (TIA), and cerebral infarction without residual deficits: Secondary | ICD-10-CM | POA: Diagnosis not present

## 2017-08-06 DIAGNOSIS — Y831 Surgical operation with implant of artificial internal device as the cause of abnormal reaction of the patient, or of later complication, without mention of misadventure at the time of the procedure: Secondary | ICD-10-CM | POA: Diagnosis present

## 2017-08-06 DIAGNOSIS — Z823 Family history of stroke: Secondary | ICD-10-CM | POA: Diagnosis not present

## 2017-08-06 DIAGNOSIS — Z794 Long term (current) use of insulin: Secondary | ICD-10-CM | POA: Diagnosis not present

## 2017-08-06 DIAGNOSIS — Z888 Allergy status to other drugs, medicaments and biological substances status: Secondary | ICD-10-CM | POA: Diagnosis not present

## 2017-08-06 DIAGNOSIS — Z8249 Family history of ischemic heart disease and other diseases of the circulatory system: Secondary | ICD-10-CM | POA: Diagnosis not present

## 2017-08-06 DIAGNOSIS — Z87891 Personal history of nicotine dependence: Secondary | ICD-10-CM | POA: Diagnosis not present

## 2017-08-06 DIAGNOSIS — Z66 Do not resuscitate: Secondary | ICD-10-CM | POA: Diagnosis present

## 2017-08-06 DIAGNOSIS — E1151 Type 2 diabetes mellitus with diabetic peripheral angiopathy without gangrene: Secondary | ICD-10-CM | POA: Diagnosis present

## 2017-08-06 DIAGNOSIS — I5032 Chronic diastolic (congestive) heart failure: Secondary | ICD-10-CM | POA: Diagnosis present

## 2017-08-06 DIAGNOSIS — M316 Other giant cell arteritis: Secondary | ICD-10-CM | POA: Diagnosis present

## 2017-08-06 DIAGNOSIS — J449 Chronic obstructive pulmonary disease, unspecified: Secondary | ICD-10-CM | POA: Diagnosis present

## 2017-08-06 DIAGNOSIS — E785 Hyperlipidemia, unspecified: Secondary | ICD-10-CM | POA: Diagnosis present

## 2017-08-06 DIAGNOSIS — I25110 Atherosclerotic heart disease of native coronary artery with unstable angina pectoris: Secondary | ICD-10-CM | POA: Diagnosis present

## 2017-08-06 DIAGNOSIS — Z9119 Patient's noncompliance with other medical treatment and regimen: Secondary | ICD-10-CM

## 2017-08-06 DIAGNOSIS — Z820 Family history of epilepsy and other diseases of the nervous system: Secondary | ICD-10-CM | POA: Diagnosis not present

## 2017-08-06 DIAGNOSIS — T82855A Stenosis of coronary artery stent, initial encounter: Secondary | ICD-10-CM | POA: Diagnosis present

## 2017-08-06 DIAGNOSIS — R0602 Shortness of breath: Secondary | ICD-10-CM

## 2017-08-06 DIAGNOSIS — F329 Major depressive disorder, single episode, unspecified: Secondary | ICD-10-CM | POA: Diagnosis present

## 2017-08-06 DIAGNOSIS — R778 Other specified abnormalities of plasma proteins: Secondary | ICD-10-CM

## 2017-08-06 DIAGNOSIS — G4733 Obstructive sleep apnea (adult) (pediatric): Secondary | ICD-10-CM | POA: Diagnosis present

## 2017-08-06 DIAGNOSIS — E1140 Type 2 diabetes mellitus with diabetic neuropathy, unspecified: Secondary | ICD-10-CM | POA: Diagnosis present

## 2017-08-06 DIAGNOSIS — I110 Hypertensive heart disease with heart failure: Secondary | ICD-10-CM | POA: Diagnosis present

## 2017-08-06 DIAGNOSIS — R7989 Other specified abnormal findings of blood chemistry: Secondary | ICD-10-CM

## 2017-08-06 DIAGNOSIS — I340 Nonrheumatic mitral (valve) insufficiency: Secondary | ICD-10-CM | POA: Diagnosis not present

## 2017-08-06 DIAGNOSIS — R161 Splenomegaly, not elsewhere classified: Secondary | ICD-10-CM | POA: Diagnosis present

## 2017-08-06 DIAGNOSIS — I214 Non-ST elevation (NSTEMI) myocardial infarction: Principal | ICD-10-CM | POA: Diagnosis present

## 2017-08-06 DIAGNOSIS — I200 Unstable angina: Secondary | ICD-10-CM

## 2017-08-06 DIAGNOSIS — I10 Essential (primary) hypertension: Secondary | ICD-10-CM | POA: Diagnosis not present

## 2017-08-06 DIAGNOSIS — K7581 Nonalcoholic steatohepatitis (NASH): Secondary | ICD-10-CM | POA: Diagnosis present

## 2017-08-06 DIAGNOSIS — I252 Old myocardial infarction: Secondary | ICD-10-CM | POA: Diagnosis not present

## 2017-08-06 LAB — LIPID PANEL
CHOL/HDL RATIO: 2.6 ratio
Cholesterol: 70 mg/dL (ref 0–200)
HDL: 27 mg/dL — ABNORMAL LOW (ref >40)
LDL CALC: 2 mg/dL (ref 0–99)
Triglycerides: 205 mg/dL — ABNORMAL HIGH (ref <150)
VLDL: 41 mg/dL — AB (ref 0–40)

## 2017-08-06 LAB — CBC
HCT: 42.3 % (ref 40.0–52.0)
HEMOGLOBIN: 14.7 g/dL (ref 13.0–18.0)
MCH: 32.8 pg (ref 26.0–34.0)
MCHC: 34.8 g/dL (ref 32.0–36.0)
MCV: 94.3 fL (ref 80.0–100.0)
PLATELETS: 143 10*3/uL — AB (ref 150–440)
RBC: 4.49 MIL/uL (ref 4.40–5.90)
RDW: 14.7 % — ABNORMAL HIGH (ref 11.5–14.5)
WBC: 6.3 10*3/uL (ref 3.8–10.6)

## 2017-08-06 LAB — BRAIN NATRIURETIC PEPTIDE: B NATRIURETIC PEPTIDE 5: 107 pg/mL — AB (ref 0.0–100.0)

## 2017-08-06 LAB — BASIC METABOLIC PANEL
ANION GAP: 6 (ref 5–15)
BUN: 15 mg/dL (ref 6–20)
CALCIUM: 9.6 mg/dL (ref 8.9–10.3)
CO2: 30 mmol/L (ref 22–32)
CREATININE: 1.07 mg/dL (ref 0.61–1.24)
Chloride: 103 mmol/L (ref 101–111)
Glucose, Bld: 199 mg/dL — ABNORMAL HIGH (ref 65–99)
Potassium: 4.8 mmol/L (ref 3.5–5.1)
SODIUM: 139 mmol/L (ref 135–145)

## 2017-08-06 LAB — TROPONIN I
TROPONIN I: 0.15 ng/mL — AB (ref <0.03)
Troponin I: 0.15 ng/mL (ref <0.03)

## 2017-08-06 LAB — GLUCOSE, CAPILLARY
GLUCOSE-CAPILLARY: 223 mg/dL — AB (ref 65–99)
Glucose-Capillary: 131 mg/dL — ABNORMAL HIGH (ref 65–99)
Glucose-Capillary: 294 mg/dL — ABNORMAL HIGH (ref 65–99)

## 2017-08-06 LAB — APTT: aPTT: 35 seconds (ref 24–36)

## 2017-08-06 LAB — PROTIME-INR
INR: 1.02
PROTHROMBIN TIME: 13.3 s (ref 11.4–15.2)

## 2017-08-06 LAB — FIBRIN DERIVATIVES D-DIMER (ARMC ONLY): FIBRIN DERIVATIVES D-DIMER (ARMC): 804.33 ng{FEU}/mL — AB (ref 0.00–499.00)

## 2017-08-06 MED ORDER — PREDNISOLONE ACETATE 1 % OP SUSP
1.0000 [drp] | Freq: Four times a day (QID) | OPHTHALMIC | Status: DC
  Administered 2017-08-06 – 2017-08-10 (×11): 1 [drp] via OPHTHALMIC
  Filled 2017-08-06: qty 1

## 2017-08-06 MED ORDER — INSULIN ASPART 100 UNIT/ML ~~LOC~~ SOLN
0.0000 [IU] | Freq: Every day | SUBCUTANEOUS | Status: DC
Start: 2017-08-06 — End: 2017-08-10
  Administered 2017-08-06: 3 [IU] via SUBCUTANEOUS
  Administered 2017-08-08: 2 [IU] via SUBCUTANEOUS
  Administered 2017-08-09: 4 [IU] via SUBCUTANEOUS
  Filled 2017-08-06 (×3): qty 1

## 2017-08-06 MED ORDER — VENLAFAXINE HCL ER 75 MG PO CP24
150.0000 mg | ORAL_CAPSULE | Freq: Every day | ORAL | Status: DC
  Administered 2017-08-07 – 2017-08-10 (×3): 150 mg via ORAL
  Filled 2017-08-06 (×3): qty 2

## 2017-08-06 MED ORDER — GABAPENTIN 300 MG PO CAPS
900.0000 mg | ORAL_CAPSULE | Freq: Three times a day (TID) | ORAL | Status: DC
  Administered 2017-08-06 – 2017-08-10 (×10): 900 mg via ORAL
  Filled 2017-08-06 (×11): qty 3

## 2017-08-06 MED ORDER — ONDANSETRON HCL 4 MG/2ML IJ SOLN: 4.0000 mg | Freq: Four times a day (QID) | INTRAMUSCULAR | Status: DC | PRN

## 2017-08-06 MED ORDER — HEPARIN BOLUS VIA INFUSION
4000.0000 [IU] | Freq: Once | INTRAVENOUS | Status: AC
  Administered 2017-08-06: 4000 [IU] via INTRAVENOUS
  Filled 2017-08-06: qty 4000

## 2017-08-06 MED ORDER — FUROSEMIDE 20 MG PO TABS
20.0000 mg | ORAL_TABLET | Freq: Every day | ORAL | Status: DC
  Administered 2017-08-06 – 2017-08-10 (×5): 20 mg via ORAL
  Filled 2017-08-06 (×6): qty 1

## 2017-08-06 MED ORDER — NITROGLYCERIN 0.4 MG SL SUBL: 0.4000 mg | SUBLINGUAL_TABLET | SUBLINGUAL | Status: DC | PRN

## 2017-08-06 MED ORDER — INSULIN ASPART 100 UNIT/ML ~~LOC~~ SOLN
0.0000 [IU] | Freq: Three times a day (TID) | SUBCUTANEOUS | Status: DC
  Administered 2017-08-06: 1 [IU] via SUBCUTANEOUS
  Administered 2017-08-07 – 2017-08-08 (×3): 2 [IU] via SUBCUTANEOUS
  Administered 2017-08-08: 1 [IU] via SUBCUTANEOUS
  Administered 2017-08-08: 2 [IU] via SUBCUTANEOUS
  Administered 2017-08-09: 1 [IU] via SUBCUTANEOUS
  Administered 2017-08-09: 5 [IU] via SUBCUTANEOUS
  Filled 2017-08-06 (×8): qty 1

## 2017-08-06 MED ORDER — TRAMADOL HCL 50 MG PO TABS
50.0000 mg | ORAL_TABLET | Freq: Four times a day (QID) | ORAL | Status: DC | PRN
  Administered 2017-08-07 – 2017-08-09 (×4): 50 mg via ORAL
  Filled 2017-08-06 (×4): qty 1

## 2017-08-06 MED ORDER — ONDANSETRON HCL 4 MG PO TABS: 4.0000 mg | ORAL_TABLET | Freq: Four times a day (QID) | ORAL | Status: DC | PRN

## 2017-08-06 MED ORDER — HYDROCHLOROTHIAZIDE 25 MG PO TABS: 25.0000 mg | ORAL_TABLET | Freq: Every day | ORAL | Status: DC | PRN

## 2017-08-06 MED ORDER — CITALOPRAM HYDROBROMIDE 20 MG PO TABS
40.0000 mg | ORAL_TABLET | Freq: Every day | ORAL | Status: DC
  Administered 2017-08-06 – 2017-08-10 (×5): 40 mg via ORAL
  Filled 2017-08-06 (×5): qty 2

## 2017-08-06 MED ORDER — METOPROLOL TARTRATE 50 MG PO TABS
50.0000 mg | ORAL_TABLET | Freq: Two times a day (BID) | ORAL | Status: DC
  Administered 2017-08-06 – 2017-08-10 (×8): 50 mg via ORAL
  Filled 2017-08-06 (×8): qty 1

## 2017-08-06 MED ORDER — ATORVASTATIN CALCIUM 20 MG PO TABS
20.0000 mg | ORAL_TABLET | Freq: Every day | ORAL | Status: DC
  Administered 2017-08-06 – 2017-08-09 (×4): 20 mg via ORAL
  Filled 2017-08-06 (×4): qty 1

## 2017-08-06 MED ORDER — NITROGLYCERIN 2 % TD OINT
0.5000 [in_us] | TOPICAL_OINTMENT | Freq: Four times a day (QID) | TRANSDERMAL | Status: DC
  Administered 2017-08-06 – 2017-08-10 (×13): 0.5 [in_us] via TOPICAL
  Filled 2017-08-06 (×15): qty 1

## 2017-08-06 MED ORDER — OXYCODONE-ACETAMINOPHEN 5-325 MG PO TABS
1.0000 | ORAL_TABLET | Freq: Four times a day (QID) | ORAL | Status: DC | PRN
  Administered 2017-08-06 – 2017-08-09 (×8): 1 via ORAL
  Filled 2017-08-06 (×8): qty 1

## 2017-08-06 MED ORDER — ASPIRIN 81 MG PO CHEW
81.0000 mg | CHEWABLE_TABLET | Freq: Every day | ORAL | Status: DC
  Administered 2017-08-07 – 2017-08-10 (×3): 81 mg via ORAL
  Filled 2017-08-06 (×3): qty 1

## 2017-08-06 MED ORDER — POLYETHYLENE GLYCOL 3350 17 G PO PACK: 17.0000 g | PACK | Freq: Every day | ORAL | Status: DC | PRN

## 2017-08-06 MED ORDER — ACETAMINOPHEN 325 MG PO TABS: 650.0000 mg | ORAL_TABLET | Freq: Four times a day (QID) | ORAL | Status: DC | PRN

## 2017-08-06 MED ORDER — IOPAMIDOL (ISOVUE-370) INJECTION 76%
75.0000 mL | Freq: Once | INTRAVENOUS | Status: AC | PRN
  Administered 2017-08-06: 75 mL via INTRAVENOUS

## 2017-08-06 MED ORDER — ACETAMINOPHEN 650 MG RE SUPP: 650.0000 mg | Freq: Four times a day (QID) | RECTAL | Status: DC | PRN

## 2017-08-06 MED ORDER — ASPIRIN EC 325 MG PO TBEC
325.0000 mg | DELAYED_RELEASE_TABLET | Freq: Once | ORAL | Status: AC
  Administered 2017-08-06: 325 mg via ORAL
  Filled 2017-08-06: qty 1

## 2017-08-06 MED ORDER — INSULIN ASPART PROT & ASPART (70-30 MIX) 100 UNIT/ML ~~LOC~~ SUSP
10.0000 [IU] | Freq: Two times a day (BID) | SUBCUTANEOUS | Status: DC
  Administered 2017-08-07 – 2017-08-10 (×6): 10 [IU] via SUBCUTANEOUS
  Filled 2017-08-06 (×7): qty 1

## 2017-08-06 MED ORDER — HEPARIN (PORCINE) IN NACL 100-0.45 UNIT/ML-% IJ SOLN
1450.0000 [IU]/h | INTRAMUSCULAR | Status: DC
  Administered 2017-08-06: 1000 [IU]/h via INTRAVENOUS
  Administered 2017-08-07: 1150 [IU]/h via INTRAVENOUS
  Administered 2017-08-08 (×2): 1300 [IU]/h via INTRAVENOUS
  Filled 2017-08-06 (×4): qty 250

## 2017-08-06 NOTE — H&P (Addendum)
[facility] NAME: Larry Terrell    MR#:  [id number]  DATE OF BIRTH:  [birth year]  DATE OF ADMISSION:  08/06/2017  PRIMARY CARE PHYSICIAN: dolan-soto, diane   REQUESTING/REFERRING PHYSICIAN: dr Kerman Passey  CHIEF COMPLAINT:   SOB HISTORY OF PRESENT ILLNESS:  Larry Terrell  is a 57 y.o. male with a known history of chronic diastolic heart failure with preserved ejection fraction, COPD, OSA not compliant with CPAP, diabetes and CAD who presents with shortness of breath. Patient reports over the past several days he has had on and off shortness of breath associated with some chest discomfort. He also said for several days he has had left-sided pain around his ribs. He denies nausea, vomiting, diaphoresis, lower extremity edema, PND orthopnea. He denies wheezing, fever or chills. CT of the chest was negative for pulmonary emboli, pneumonia or CHF. First set of troponins are elevated. He has been started on heparin by the ED physician.  PAST MEDICAL HISTORY:   Past Medical History:  Diagnosis Date  . CHF (congestive heart failure) (HCC)    diastolic (echo at [facility] 4132) EF 55-60%  . Cirrhosis ([place])    NASH per records  . COPD (chronic obstructive pulmonary disease) ([place])   . Depression   . Diabetes mellitus with neuropathy ([place])   . Heart attack ([place])   . Neuropathy   . OSA (obstructive sleep apnea)   . Peripheral vascular disease due to secondary diabetes mellitus ([place])   . Splenomegaly   . Temporal giant cell arteritis ([place])   . TIA (transient ischemic attack) April 2016    PAST SURGICAL HISTORY:   Past Surgical History:  Procedure Laterality Date  . AORTA - FEMORAL ARTERY BYPASS GRAFT Bilateral   . CARDIAC CATHETERIZATION    . cardiac stents  July 2015   . LAPAROSCOPIC APPENDECTOMY N/A 07/10/2015   Procedure: APPENDECTOMY LAPAROSCOPIC;  Surgeon: Rolm Bookbinder, MD;  Location: [facility];  Service: General;  Laterality: N/A;     SOCIAL HISTORY:   Social History   Tobacco Use  . Smoking status: Former Research scientist (life sciences)  . Smokeless tobacco: Never Used  . Tobacco comment: Quit 20016 per records  Substance Use Topics  . Alcohol use: No    Alcohol/week: 0.0 oz    FAMILY HISTORY:   Family History  Problem Relation Age of Onset  . Heart attack Mother 74  . Stroke Mother   . Heart attack Father   . Alzheimer's disease Father     DRUG ALLERGIES:   Allergies  Allergen Reactions  . Ace Inhibitors Other (See Comments)    Pt reports ace inhibitors make him cough    REVIEW OF SYSTEMS:   Review of Systems  Constitutional: Negative.  Negative for chills, fever and malaise/fatigue.  HENT: Negative.  Negative for ear discharge, ear pain, hearing loss, nosebleeds and sore throat.   Eyes: Negative.  Negative for blurred vision and pain.  Respiratory: Positive for shortness of breath. Negative for cough, hemoptysis and wheezing.   Cardiovascular: Negative for chest pain, palpitations and leg swelling.       Discomfort in his chest  Gastrointestinal: Negative.  Negative for abdominal pain, blood in stool, diarrhea, nausea and vomiting.  Genitourinary: Negative.  Negative for dysuria.  Musculoskeletal: Negative.  Negative for back pain.  Skin: Negative.   Neurological: Negative for dizziness, tremors, speech change, focal weakness, seizures and headaches.  Endo/Heme/Allergies: Negative.  Does not bruise/bleed easily.  Psychiatric/Behavioral: Negative.  Negative for depression, hallucinations and suicidal ideas.    MEDICATIONS AT HOME:   Prior to Admission medications   Medication Sig Start Date End Date Taking? Authorizing Provider  atorvastatin (LIPITOR) 40 MG tablet Take 40 mg by mouth daily.    Yes [provider]  metFORMIN (GLUCOPHAGE) 500 MG tablet Take 500 mg by mouth 2 (two) times daily with a meal.   Yes [provider]  metoprolol (LOPRESSOR) 50 MG tablet Take 50 mg by mouth 2 (two)  times daily.   Yes [provider]  nitroGLYCERIN (NITROSTAT) 0.4 MG SL tablet Place 0.4 mg under the tongue every 5 (five) minutes as needed for chest pain.   Yes [provider]  traMADol (ULTRAM) 50 MG tablet Take 1 tablet (50 mg total) by mouth every 6 (six) hours as needed. Patient taking differently: Take 50 mg by mouth every 6 (six) hours as needed for moderate pain.  05/14/15  Yes Loney Hering, MD  venlafaxine XR (EFFEXOR-XR) 150 MG 24 hr capsule Take 150 mg by mouth daily with breakfast.   Yes [provider]  insulin NPH-regular Human (NOVOLIN 70/30) (70-30) 100 UNIT/ML injection Inject 20-24 Units into the skin 2 (two) times daily. Take 24units in the morning and 20units in the evening.    [provider]  oxyCODONE-acetaminophen (ROXICET) 5-325 MG tablet Take 1 tablet by mouth every 6 (six) hours as needed for moderate pain or severe pain. Patient not taking: Reported on 08/06/2017 01/14/16   Harvest Dark, MD      VITAL SIGNS:  Blood pressure (!) 151/73, pulse 76, temperature (!) 97.4 F (36.3 C), temperature source Oral, resp. rate 13, height 5' 7"  (1.702 m), weight 90.7 kg (200 lb), SpO2 92 %.  PHYSICAL EXAMINATION:   Physical Exam  Constitutional: He is oriented to person, place, and time and well-developed, well-nourished, and in no distress. No distress.  HENT:  Head: Normocephalic.  Eyes: No scleral icterus.  Neck: Normal range of motion. Neck supple. No JVD present. No tracheal deviation present.  Cardiovascular: Normal rate, regular rhythm and normal heart sounds. Exam reveals no gallop and no friction rub.  No murmur heard. Pulmonary/Chest: Effort normal and breath sounds normal. No respiratory distress. He has no wheezes. He has no rales. He exhibits no tenderness.  Abdominal: Soft. Bowel sounds are normal. He exhibits no distension and no mass. There is no tenderness. There is no rebound and no guarding.  Musculoskeletal:  Normal range of motion. He exhibits no edema.  Neurological: He is alert and oriented to person, place, and time.  Skin: Skin is warm. No rash noted. No erythema.  Psychiatric: Affect and judgment normal.      LABORATORY PANEL:   CBC Recent Labs  Lab 08/06/17 1358  WBC 6.3  HGB 14.7  HCT 42.3  PLT 143*   ------------------------------------------------------------------------------------------------------------------  Chemistries  Recent Labs  Lab 08/06/17 1358  NA 139  K 4.8  CL 103  CO2 30  GLUCOSE 199*  BUN 15  CREATININE 1.07  CALCIUM 9.6   ------------------------------------------------------------------------------------------------------------------  Cardiac Enzymes Recent Labs  Lab 08/06/17 1358  TROPONINI 0.15*   ------------------------------------------------------------------------------------------------------------------  RADIOLOGY:  Dg Chest 2 View  Result Date: 08/06/2017 CLINICAL DATA:  Cough and shortness of breath EXAM: CHEST  2 VIEW COMPARISON:  Nov 29, 2015 FINDINGS: There is interstitial thickening bilaterally. There is no edema or consolidation. Heart is mildly enlarged with pulmonary vascularity within normal limits. There is aortic atherosclerosis. No evident bone  lesions. IMPRESSION: Chronic interstitial thickening, stable. No edema or consolidation. Stable cardiac prominence. There is aortic atherosclerosis. No evident adenopathy. Aortic Atherosclerosis (ICD10-I70.0). Electronically Signed   By: Lowella Grip III M.D.   On: 08/06/2017 14:49   Ct Angio Chest Pe W And/or Wo Contrast  Result Date: 08/06/2017 CLINICAL DATA:  Cough and shortness of breath for 2-3 days. EXAM: CT ANGIOGRAPHY CHEST WITH CONTRAST TECHNIQUE: Multidetector CT imaging of the chest was performed using the standard protocol during bolus administration of intravenous contrast. Multiplanar CT image reconstructions and MIPs were obtained to evaluate the vascular  anatomy. CONTRAST:  8m ISOVUE-370 IOPAMIDOL (ISOVUE-370) INJECTION 76% COMPARISON:  Chest radiograph 08/06/2017, body CT 04/29/2014 FINDINGS: Cardiovascular: Satisfactory opacification of the pulmonary arteries to the segmental level. No evidence of pulmonary embolism. Normal heart size. No pericardial effusion. Calcific atherosclerotic disease of the coronary arteries. Mediastinum/Nodes: Enlarged anterior mediastinal lymph node at right pretracheal station measures 1.6 cm in short axis. There is also right hilar lymphadenopathy measuring 1.5 cm in short axis. Lungs/Pleura: Perifissural soft tissue right middle lobe nodule has increased to 10 mm, from prior measurement of 5 mm in 2015. Image 39/89, sequence 6. There is also a 4 mm soft tissue nodule in the right upper lobe, image 19/89, sequence 6. A string of tiny perifissural nodules are seen along the horizontal fissure, best seen on coronal images, image 38/95, sequence 7. There is a 2-3 mm soft tissue nodule in the right middle lobe, image 44/89, sequence 6. Mild ground-glass opacity airspace consolidation in the lingula with subtle ground-glass nodule measuring 8 mm, best seen on coronal images, image 49/95, sequence 7. Upper Abdomen: No acute abnormality. Musculoskeletal: No chest wall abnormality. No acute osseous findings. 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 Review of the MIP images confirms the above findings. IMPRESSION: No evidence of pulmonary embolus. 10 mm right middle lobe perifissural soft tissue nodule, which has doubled when compared to 2015. Primary lung malignancy versus enlarging intraparenchymal lymph node is of consideration. Several other smaller soft tissue nodules throughout the right lung, including a string of tiny perifissural nodules along the horizontal fissure. Consider one of the following in 3 months for both low-risk and high-risk individuals: (a) repeat chest CT, (b) follow-up PET-CT, or  (c) tissue sampling. This recommendation follows the consensus statement: Guidelines for Management of Incidental Pulmonary Nodules Detected on CT Images: From the Fleischner Society 2017; Radiology 2017; 284:228-243. Mild right-sided anterior mediastinal and right perihilar lymphadenopathy, which may be malignant or reactive. Mild ground-glass attenuation airspace opacities in the lingula, likely infectious/inflammatory. Hypoventilatory changes in bilateral lower lobes. Mild calcific atherosclerotic disease of the coronary arteries. Aortic Atherosclerosis (ICD10-I70.0). Electronically Signed   By: DFidela SalisburyM.D.   On: 08/06/2017 16:14    EKG:   Normal sinus rhythm with Q waves in the anterior leads  IMPRESSION AND PLAN:   57year old male with history of CAD, diabetes, OSA not compliant with CPAP, COPD who presents with shortness of breath and chest discomfort.  1. Non-ST elevation MI: I suspect that shortness of breath and chest discomfort are cardiac related. CT chest did not show evidence of pulmonary by, pneumonia or CHF. Continue aspirin, statin and metoprolol Nitroglycerin patch ordered Check lipid panel chmg cardiology consultation Echocardiogram ordered Follow telemetry and troponins   2. Diabetes: Continue NPH with sliding scale and ADA diet Check hemoglobin A1c 3. Hyperlipidemia: Continue Lipitor Check lipid panel  4. OSA: Patient is not compliant with CPAP and does  not want to wear CPAP while in the hospital  5. COPD without signs of exacerbation  6. Depression: Continue Effexor  7. Chronic diastolic heart failure: Patient does not appear to be in exacerbation at this time  All the records are reviewed and case discussed with ED provider. Management plans discussed with the patient and he is in agreement  CODE STATUS: DO NOT RESUSCITATE  TOTAL TIME TAKING CARE OF THIS PATIENT: 40 minutes.    Concha Sudol M.D on 08/06/2017 at 4:55 PM  Between 7am to 6pm -  Pager - [phone]  After 6pm go to [email or web address] - password [email or web address] [facility]  Office  [phone]  CC: Primary care physician; dolan-soto, diane

## 2017-08-06 NOTE — Consult Note (Signed)
ANTICOAGULATION CONSULT NOTE - Initial Consult  Pharmacy Consult for heparin drip Indication: chest pain/ACS  Allergies  Allergen Reactions  . Ace Inhibitors Other (See Comments)    Pt reports ace inhibitors make him cough    Patient Measurements: Height: 5' 7"  (170.2 cm) Weight: 200 lb (90.7 kg) IBW/kg (Calculated) : 66.1 Heparin Dosing Weight: 85.1kg  Vital Signs: Temp: 97.4 F (36.3 C) (01/25 1350) Temp Source: Oral (01/25 1350) BP: 151/73 (01/25 1600) Pulse Rate: 76 (01/25 1600)  Labs: Recent Labs    08/06/17 1358  HGB 14.7  HCT 42.3  PLT 143*  CREATININE 1.07  TROPONINI 0.15*    Estimated Creatinine Clearance: 80.8 mL/min (by C-G formula based on SCr of 1.07 mg/dL).   Medical History: Past Medical History:  Diagnosis Date  . CHF (congestive heart failure) (HCC)    diastolic (echo at [facility] 1610) EF 55-60%  . Cirrhosis ([place])    NASH per records  . COPD (chronic obstructive pulmonary disease) ([place])   . Depression   . Diabetes mellitus with neuropathy ([place])   . Heart attack ([place])   . Neuropathy   . OSA (obstructive sleep apnea)   . Peripheral vascular disease due to secondary diabetes mellitus ([place])   . Splenomegaly   . 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 TIA (transient ischemic attack) April 2016    Medications:  Scheduled:  . heparin  4,000 Units Intravenous Once  . nitroGLYCERIN  0.5 inch Topical Q6H    Assessment: Patient is a 57 year old male who presents with SOB, chest discomfort. CT neg for PE but positive for mass/node. Pt has elevated troponin. Pharmacy consulted to dose heparin drip. Cardiology has been consulted. CBC relatively WNL. Baseline INR and APTT ordered as add on.  Goal of Therapy:  Heparin level 0.3-0.7 units/ml Monitor platelets by anticoagulation protocol: Yes   Plan:  Give 4000 units bolus x 1 Start heparin infusion at 1000 units/hr Check anti-Xa level in 6 hours and daily while on heparin Continue to monitor H&H  and platelets  Antuan Limes D Mayara Paulson, Pharm.D, BCPS Clinical Pharmacist 08/06/2017,5:00 PM

## 2017-08-06 NOTE — Progress Notes (Signed)
°   08/06/17 1730  Clinical Encounter Type  Visited With Other (Comment)  Visit Type Initial  Referral From Nurse  Consult/Referral To Chaplain  Spiritual Encounters  Spiritual Needs Other (Comment)   PT declined AD.

## 2017-08-06 NOTE — ED Notes (Signed)
Pt transported to CT ?

## 2017-08-06 NOTE — ED Triage Notes (Signed)
First Nurse Note:  C/O cough and SOB x 2-3 days.    Patient is AAOx3.  Skin warm and dry. NAD

## 2017-08-06 NOTE — ED Triage Notes (Signed)
Pt c/o dry cough and shortness of breath for about 3-4 days, states shortness of breath worse over the past hour.  Pt also states he has had chest pressure intermittently, denies any chest pain at this time.   Pt able to speak in full sentences without running out of breath

## 2017-08-06 NOTE — Progress Notes (Signed)
Family Meeting Note  Advance Directive:no  Today a meeting took place with the Patient.  The following clinical team members were present during this meeting:MD  The following were discussed:Patient's diagnosis: CAD /cardiac stents NSTEMi OSA not compliant CHD chronic diasolic Diabetes , Patient's progosis: Unable to determine and Goals for treatment: DNR  Additional follow-up to be provided: chaplain consult for advance directives  Time spent during discussion:16 minutes  Larry Walkowski, MD

## 2017-08-06 NOTE — ED Notes (Signed)
Troponin .73, Dr. Jacqualine Code notified.

## 2017-08-06 NOTE — ED Provider Notes (Signed)
[facility] Emergency Department Provider Note   ____________________________________________   First MD Initiated Contact with Patient 08/06/17 1408     (approximate)  I have reviewed the triage vital signs and the nursing notes.   HISTORY  Chief Complaint Shortness of Breath    HPI Larry Terrell is a 57 y.o. male reports he has been feeling short of breath for about 3 days, but yesterday his symptoms seem to worsen.  He was seated at home, got up to do something and felt like his heart was pounding hard.  Since that time he had a persistent feeling of slight shortness of breath.  No leg swelling.  Reports he sleeps on his side normally, has not had a problem up on pillows.  Does not change with laying flat.  No wheezing.  No cough.  No productive.  No fevers no chills.  Denies any chest pain.  Reports he just feels mildly short of breath and is worse when he tries to walk around and do things like cleaning today.  Past Medical History:  Diagnosis Date  . CHF (congestive heart failure) (HCC)    diastolic (echo at [facility] 5885) EF 55-60%  . Cirrhosis ([place])    NASH per records  . COPD (chronic obstructive pulmonary disease) ([place])   . Depression   . Diabetes mellitus with neuropathy ([place])   . Heart attack ([place])   . Neuropathy   . OSA (obstructive sleep apnea)   . Peripheral vascular disease due to secondary diabetes mellitus ([place])   . Splenomegaly   . 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 TIA (transient ischemic attack) April 2016    Patient Active Problem List   Diagnosis Date Noted  . Acute appendicitis 07/10/2015  . S/P laparoscopic appendectomy 07/10/2015  . Appendicitis, acute   . Diabetes mellitus with complication ([place])   . Chronic congestive heart failure with left ventricular diastolic dysfunction ([place])   . Lactic acidosis   . Acute respiratory failure with hypoxia [facility])     Past Surgical History:  Procedure Laterality Date  .  AORTA - FEMORAL ARTERY BYPASS GRAFT Bilateral   . CARDIAC CATHETERIZATION    . cardiac stents  July 2015   . LAPAROSCOPIC APPENDECTOMY N/A 07/10/2015   Procedure: APPENDECTOMY LAPAROSCOPIC;  Surgeon: Rolm Bookbinder, MD;  Location: [place];  Service: General;  Laterality: N/A;    Prior to Admission medications   Medication Sig Start Date End Date Taking? Authorizing Provider  atorvastatin (LIPITOR) 20 MG tablet Take 20 mg by mouth daily.    [provider]  citalopram (CELEXA) 40 MG tablet Take 40 mg by mouth daily.    [provider]  furosemide (LASIX) 20 MG tablet Take 20 mg by mouth daily.    [provider]  gabapentin (NEURONTIN) 300 MG capsule Take 900 mg by mouth 3 (three) times daily.    [provider]  hydrochlorothiazide (HYDRODIURIL) 25 MG tablet Take 25 mg by mouth daily as needed (only when taking tramadol).    [provider]  insulin NPH-regular Human (NOVOLIN 70/30) (70-30) 100 UNIT/ML injection Inject 20-24 Units into the skin 2 (two) times daily. Take 24units in the morning and 20units in the evening.    [provider]  metoprolol (LOPRESSOR) 50 MG tablet Take 50 mg by mouth 2 (two) times daily.    [provider]  nitroGLYCERIN (NITROSTAT) 0.4 MG SL tablet Place 0.4 mg under the tongue every 5 (five)  minutes as needed for chest pain.    [provider]  oxyCODONE-acetaminophen (ROXICET) 5-325 MG tablet Take 1 tablet by mouth every 6 (six) hours as needed for moderate pain or severe pain. 01/14/16   Harvest Dark, MD  prednisoLONE acetate (PRED FORTE) 1 % ophthalmic suspension Place 1 drop into the left eye 4 (four) times daily.    [provider]  traMADol (ULTRAM) 50 MG tablet Take 1 tablet (50 mg total) by mouth every 6 (six) hours as needed. Patient taking differently: Take 50 mg by mouth every 6 (six) hours as needed for moderate pain.  05/14/15   Loney Hering, MD     Allergies Ace inhibitors  Family History  Problem Relation Age of Onset  . Heart attack Mother 21  . Stroke Mother   . Heart attack Father   . Alzheimer's disease Father     Social History Social History   Tobacco Use  . Smoking status: Former Research scientist (life sciences)  . Smokeless tobacco: Never Used  . Tobacco comment: Quit 20016 per records  Substance Use Topics  . Alcohol use: No    Alcohol/week: 0.0 oz  . Drug use: No    Review of Systems Constitutional: No fever/chills Eyes: No visual changes. ENT: No sore throat. Cardiovascular: Denies chest pain. Respiratory: See HPI gastrointestinal: No abdominal pain.  No nausea, no vomiting.  No diarrhea.  No constipation. Genitourinary: Negative for dysuria. Musculoskeletal: Negative for back pain. Skin: Negative for rash. Neurological: Negative for headaches, focal weakness or numbness.    ____________________________________________   PHYSICAL EXAM:  VITAL SIGNS: ED Triage Vitals  Enc Vitals Group     BP 08/06/17 1350 (!) 163/76     Pulse Rate 08/06/17 1350 76     Resp 08/06/17 1350 18     Temp 08/06/17 1350 (!) 97.4 F (36.3 C)     Temp Source 08/06/17 1350 Oral     SpO2 08/06/17 1350 94 %     Weight 08/06/17 1350 200 lb (90.7 kg)     Height 08/06/17 1350 5' 7"  (1.702 m)     Head Circumference --      Peak Flow --      Pain Score 08/06/17 1357 0     Pain Loc --      Pain Edu? --      Excl. in [place]? --     Constitutional: Alert and oriented. Well appearing and in no acute distress. Eyes: Conjunctivae are normal. Head: Atraumatic. Nose: No congestion/rhinnorhea. Mouth/Throat: Mucous membranes are moist. Neck: No stridor.   Cardiovascular: Normal rate, regular rhythm. Grossly normal heart sounds.  Good peripheral circulation. Respiratory: Normal respiratory effort.  No retractions. Lungs CTAB.  Does not appear dyspneic. Gastrointestinal: Soft and nontender. No distention. Musculoskeletal: No lower extremity  tenderness nor edema.  No peripheral edema. Neurologic:  Normal speech and language. No gross focal neurologic deficits are appreciated.  Skin:  Skin is warm, dry and intact. No rash noted. Psychiatric: Mood and affect are normal. Speech and behavior are normal.  ____________________________________________   LABS (all labs ordered are listed, but only abnormal results are displayed)  Labs Reviewed  BASIC METABOLIC PANEL - Abnormal; Notable for the following components:      Result Value   Glucose, Bld 199 (*)    All other components within normal limits  CBC - Abnormal; Notable for the following components:   RDW 14.7 (*)    Platelets 143 (*)    All other components  within normal limits  TROPONIN I - Abnormal; Notable for the following components:   Troponin I 0.15 (*)    All other components within normal limits  FIBRIN DERIVATIVES D-DIMER ([facility] ONLY) - Abnormal; Notable for the following components:   Fibrin derivatives D-dimer [facility]) 804.33 (*)    All other components within normal limits  BRAIN NATRIURETIC PEPTIDE   ____________________________________________  EKG  Reviewed and interpreted by me at 1400 Heart rate 75 QRS 80 QTC 4 400 Normal sinus rhythm, probable old inferior infarct.  No evidence of acute ischemia noted no ST elevation.  No change since Nov 29, 2015 is noted ____________________________________________  RADIOLOGY    Chest x-ray reviewed, interstitial thickening is stable.  Reviewed by me.  No evidence of acute disease noted ____________________________________________   PROCEDURES  Procedure(s) performed: None  Procedures  Critical Care performed: No  ____________________________________________   INITIAL IMPRESSION / ASSESSMENT AND PLAN / ED COURSE  Pertinent labs & imaging results that were available during my care of the patient were reviewed by me and considered in my medical decision making (see chart for details).  Patient  presents for evaluation of dyspnea.  Denies any other seemingly associated symptoms does have a history of COPD, congestive heart failure but no evidence of peripheral edema.  No hypoxia speaking full and clear sentences.  No chest pain, however his first troponin is markedly elevated at 0.15 from his baseline.  Etiology somewhat unclear, the fact that he denies any chest pain his EKG looks stable from previous argues against an acute coronary syndrome, I doubt this is the case given his dyspnea raises suspicion for PE, or other acute intrapulmonary process.  I have ordered a CT of the chest, fully anticipate admission.  ----------------------------------------- 3:31 PM on 08/06/2017 -----------------------------------------  Patient reports no chest pain.  Awaiting CT angiogram.  Patient aware and agreeable with plan for admission.  Currently reports just a [place] feeling of dyspnea.  Does not appear dyspneic.  Clinical Course as of Aug 06 1554  Fri Aug 06, 2017  1544 Fibrin derivatives D-dimer [facility]): (!) 804.33 [KP]    Clinical Course User Index [KP] Harvest Dark, MD    Ongoing care assigned to Dr. Kerman Passey.  Follow-up on CT angiogram, anticipate admission thereafter. ____________________________________________   FINAL CLINICAL IMPRESSION(S) / ED DIAGNOSES  Final diagnoses:  Elevated troponin  Shortness of breath      NEW MEDICATIONS STARTED DURING THIS VISIT:  New Prescriptions   No medications on file     Note:  This document was prepared using Dragon voice recognition software and may include unintentional dictation errors.     Delman Kitten, MD 08/06/17 1556

## 2017-08-06 NOTE — ED Provider Notes (Signed)
-----------------------------------------   4:44 PM on 08/06/2017 -----------------------------------------  Patient CT has resulted negative for PE but positive for enlarging chest mass/node.  Discussed this with the patient the need to follow-up for bronchoscopy or PET/CT.  Patient agreeable.  Given elevated troponin with shortness of breath patient will be admitted to the hospital.  He now states he is having some left-sided chest discomfort as well.  We will placed on a heparin drip and admit to the hospitalist service for further treatment.  Patient agreeable to plan of care.   Harvest Dark, MD 08/06/17 [phone]

## 2017-08-07 ENCOUNTER — Inpatient Hospital Stay (HOSPITAL_COMMUNITY)
Admit: 2017-08-07 | Discharge: 2017-08-07 | Disposition: A | Discharge status: Home / Self Care | Payer: Medicare Other | Attending: Internal Medicine | Admitting: Internal Medicine

## 2017-08-07 DIAGNOSIS — I214 Non-ST elevation (NSTEMI) myocardial infarction: Secondary | ICD-10-CM

## 2017-08-07 DIAGNOSIS — I340 Nonrheumatic mitral (valve) insufficiency: Secondary | ICD-10-CM

## 2017-08-07 LAB — BASIC METABOLIC PANEL
Anion gap: 9 (ref 5–15)
BUN: 16 mg/dL (ref 6–20)
CHLORIDE: 105 mmol/L (ref 101–111)
CO2: 25 mmol/L (ref 22–32)
CREATININE: 1.01 mg/dL (ref 0.61–1.24)
Calcium: 9.2 mg/dL (ref 8.9–10.3)
GFR calc Af Amer: >60 mL/min (ref >60)
GFR calc non Af Amer: >60 mL/min (ref >60)
Glucose, Bld: 170 mg/dL — ABNORMAL HIGH (ref 65–99)
Potassium: 4.3 mmol/L (ref 3.5–5.1)
SODIUM: 139 mmol/L (ref 135–145)

## 2017-08-07 LAB — ECHOCARDIOGRAM COMPLETE
Area-P 1/2: 3.01 cm2
E decel time: 250 msec
EERAT: 9.9
FS: 23 % — AB (ref 28–44)
Height: 67 in
IVS/LV PW RATIO, ED: 1.51
LA diam end sys: 51 mm
LA diam index: 2.44 cm/m2
LA vol A4C: 48.3 ml
LASIZE: 51 mm
LV E/e' medial: 9.9
LV E/e'average: 9.9
LV e' LATERAL: 10.2 cm/s
Lateral S' vel: 9.57 cm/s
MV Dec: 250
MVPG: 4 mmHg
MVPKAVEL: 60.1 m/s
MVPKEVEL: 101 m/s
MVSPHT: 73 ms
PW: 11.5 mm — AB (ref 0.6–1.1)
RV TAPSE: 22.4 mm
TDI e' lateral: 10.2
TDI e' medial: 4.3
WEIGHTICAEL: 3177.6 [oz_av]

## 2017-08-07 LAB — CBC
HCT: 42.1 % (ref 40.0–52.0)
Hemoglobin: 14.7 g/dL (ref 13.0–18.0)
MCH: 33.2 pg (ref 26.0–34.0)
MCHC: 35 g/dL (ref 32.0–36.0)
MCV: 94.9 fL (ref 80.0–100.0)
PLATELETS: 128 10*3/uL — AB (ref 150–440)
RBC: 4.44 MIL/uL (ref 4.40–5.90)
RDW: 15 % — AB (ref 11.5–14.5)
WBC: 6.9 10*3/uL (ref 3.8–10.6)

## 2017-08-07 LAB — GLUCOSE, CAPILLARY
GLUCOSE-CAPILLARY: 181 mg/dL — AB (ref 65–99)
Glucose-Capillary: 185 mg/dL — ABNORMAL HIGH (ref 65–99)
Glucose-Capillary: 113 mg/dL — ABNORMAL HIGH (ref 65–99)
Glucose-Capillary: 167 mg/dL — ABNORMAL HIGH (ref 65–99)

## 2017-08-07 LAB — HEPARIN LEVEL (UNFRACTIONATED)
Heparin Unfractionated: 0.21 IU/mL — ABNORMAL LOW (ref 0.30–0.70)
Heparin Unfractionated: 0.22 IU/mL — ABNORMAL LOW (ref 0.30–0.70)
Heparin Unfractionated: 0.34 IU/mL (ref 0.30–0.70)

## 2017-08-07 LAB — HEMOGLOBIN A1C
Hgb A1c MFr Bld: 5.5 % (ref 4.8–5.6)
Mean Plasma Glucose: 111.15 mg/dL

## 2017-08-07 LAB — TROPONIN I
TROPONIN I: 0.12 ng/mL — AB (ref <0.03)
TROPONIN I: 0.13 ng/mL — AB (ref <0.03)

## 2017-08-07 MED ORDER — PERFLUTREN LIPID MICROSPHERE
1.0000 mL | INTRAVENOUS | Status: AC | PRN
  Administered 2017-08-07: 6 mL via INTRAVENOUS
  Filled 2017-08-07: qty 10

## 2017-08-07 MED ORDER — HEPARIN BOLUS VIA INFUSION
1300.0000 [IU] | Freq: Once | INTRAVENOUS | Status: AC
  Administered 2017-08-07: 1300 [IU] via INTRAVENOUS
  Filled 2017-08-07: qty 1300

## 2017-08-07 NOTE — Progress Notes (Signed)
Pt started complaining of "beats" in his chest. He denies CP and SOB at rest. VSS: BP (!) 145/70 (BP Location: Left Arm)   Pulse 71   Temp 98.3 F (36.8 C) (Oral)   Resp 16   Ht 5' 7"  (1.702 m)   Wt 90.1 kg (198 lb 9.6 oz)   SpO2 96%   BMI 31.11 kg/m . Central tele called and they stated that the pt is having frequent PACs. Dr. Jerelyn Charles called, no new orders received.

## 2017-08-07 NOTE — Consult Note (Signed)
Cardiology Consultation:   Patient ID: Larry Terrell; [id number]; 04-08-1961   Admit date: 08/06/2017 Date of Consult: 08/07/2017  Primary Care Provider: Patient, No Pcp Per Primary Cardiologist: new , [place] Primary Electrophysiologist:     Patient Profile:   Larry Terrell is a 57 y.o. male with a hx of known chronic chronic diastolic congestive heart failure, COPD and obstructive sleep apnea, diabetes mellitus, coronary artery disease  who is being seen today for the evaluation of shortness of breath and chest tightness at the request of Dr. Posey Pronto.  History of Present Illness:   Larry Terrell is a 57 year old gentleman with a history of chronic diastolic congestive heart failure.  COPD.  Obstructive sleep apnea, diabetes mellitus and coronary artery disease.  He presents with several days of chest tightness and shortness of breath.  He actually presented to the emergency room to get a breathing treatment.  CT angiogram of the chest was negative for pulmonary emboli.  Initial troponins are minimally elevated.  He is been started on heparin.  He is feeling a bit better this morning.  History of heart attacks in the past and status post stenting at [facility] .   History of heart attacks in the past and is status post stenting at [facility].  His presenting symptoms prior to that episode was arm pain.  Past Medical History:  Diagnosis Date  . CHF (congestive heart failure) (HCC)    diastolic (echo at [facility] 4098) EF 55-60%  . Cirrhosis ([place])    NASH per records  . COPD (chronic obstructive pulmonary disease) ([place])   . Depression   . Diabetes mellitus with neuropathy ([place])   . Heart attack ([place])   . Neuropathy   . OSA (obstructive sleep apnea)   . Peripheral vascular disease due to secondary diabetes mellitus ([place])   . Splenomegaly   . Temporal giant cell arteritis ([place])   . TIA (transient ischemic attack) April 2016    Past Surgical History:  Procedure Laterality Date    . AORTA - FEMORAL ARTERY BYPASS GRAFT Bilateral   . CARDIAC CATHETERIZATION    . cardiac stents  July 2015   . LAPAROSCOPIC APPENDECTOMY N/A 07/10/2015   Procedure: APPENDECTOMY LAPAROSCOPIC;  Surgeon: Rolm Bookbinder, MD;  Location: [place];  Service: General;  Laterality: N/A;     Home Medications:  Prior to Admission medications   Medication Sig Start Date End Date Taking? Authorizing Provider  atorvastatin (LIPITOR) 40 MG tablet Take 40 mg by mouth daily.    Yes [provider]  metFORMIN (GLUCOPHAGE) 500 MG tablet Take 500 mg by mouth 2 (two) times daily with a meal.   Yes [provider]  metoprolol (LOPRESSOR) 50 MG tablet Take 50 mg by mouth 2 (two) times daily.   Yes [provider]  nitroGLYCERIN (NITROSTAT) 0.4 MG SL tablet Place 0.4 mg under the tongue every 5 (five) minutes as needed for chest pain.   Yes [provider]  traMADol (ULTRAM) 50 MG tablet Take 1 tablet (50 mg total) by mouth every 6 (six) hours as needed. Patient taking differently: Take 50 mg by mouth every 6 (six) hours as needed for moderate pain.  05/14/15  Yes Loney Hering, MD  venlafaxine XR (EFFEXOR-XR) 150 MG 24 hr capsule Take 150 mg by mouth daily with breakfast.   Yes [provider]  insulin NPH-regular Human (NOVOLIN 70/30) (70-30) 100 UNIT/ML injection Inject 20-24 Units into the skin 2 (two) times  daily. Take 24units in the morning and 20units in the evening.    [provider]  oxyCODONE-acetaminophen (ROXICET) 5-325 MG tablet Take 1 tablet by mouth every 6 (six) hours as needed for moderate pain or severe pain. Patient not taking: Reported on 08/06/2017 01/14/16   Harvest Dark, MD    Inpatient Medications: Scheduled Meds: . aspirin  81 mg Oral Daily  . atorvastatin  20 mg Oral Daily  . citalopram  40 mg Oral Daily  . furosemide  20 mg Oral Daily  . gabapentin  900 mg Oral TID  . insulin aspart  0-5 Units Subcutaneous QHS  .  insulin aspart  0-9 Units Subcutaneous TID WC  . insulin aspart protamine- aspart  10 Units Subcutaneous BID WC  . metoprolol tartrate  50 mg Oral BID  . nitroGLYCERIN  0.5 inch Topical Q6H  . prednisoLONE acetate  1 drop Left Eye QID  . venlafaxine XR  150 mg Oral Q breakfast   Continuous Infusions: . heparin 1,150 Units/hr (08/07/17 0505)   PRN Meds: acetaminophen **OR** acetaminophen, hydrochlorothiazide, nitroGLYCERIN, ondansetron **OR** ondansetron (ZOFRAN) IV, oxyCODONE-acetaminophen, polyethylene glycol, traMADol  Allergies:    Allergies  Allergen Reactions  . Ace Inhibitors Other (See Comments)    Pt reports ace inhibitors make him cough    Social History:   Social History   Socioeconomic History  . Marital status: Legally Separated    Spouse name: Not on file  . Number of children: Not on file  . Years of education: Not on file  . Highest education level: Not on file  Social Needs  . Financial resource strain: Not on file  . Food insecurity - worry: Not on file  . Food insecurity - inability: Not on file  . Transportation needs - medical: Not on file  . Transportation needs - non-medical: Not on file  Occupational History  . Not on file  Tobacco Use  . Smoking status: Former Research scientist (life sciences)  . Smokeless tobacco: Never Used  . Tobacco comment: Quit 20016 per records  Substance and Sexual Activity  . Alcohol use: No    Alcohol/week: 0.0 oz  . Drug use: No  . Sexual activity: Not on file  Other Topics Concern  . Not on file  Social History Narrative  . Not on file    Family History:    Family History  Problem Relation Age of Onset  . Heart attack Mother 97  . Stroke Mother   . Heart attack Father   . Alzheimer's disease Father      ROS:  Please see the history of present illness.   All other ROS reviewed and negative.     Physical Exam/Data:   Vitals:   08/06/17 1807 08/06/17 2032 08/07/17 0354 08/07/17 0737  BP: (!) 166/72 (!) 154/82 140/79 (!)  141/76  Pulse: 68 83 64 62  Resp: 18 18 17 18   Temp: 97.7 F (36.5 C) 97.9 F (36.6 C) (!) 97.5 F (36.4 C) 97.6 F (36.4 C)  TempSrc: Oral Oral Oral Oral  SpO2: 99% 95% 97% 100%  Weight:   198 lb 9.6 oz (90.1 kg)   Height:        Intake/Output Summary (Last 24 hours) at 08/07/2017 1223 Last data filed at 08/07/2017 1100 Gross per 24 hour  Intake 14 ml  Output 2525 ml  Net -2511 ml   Filed Weights   08/06/17 1350 08/07/17 0354  Weight: 200 lb (90.7 kg) 198 lb 9.6 oz (90.1 kg)  Body mass index is 31.11 kg/m.  General:   mildly obese middle-aged gentleman, no acute distress  middle-aged gentleman, no acute distress   No thryomegaly Vascular: No carotid bruits; FA pulses 2+ bilaterally without bruits  Cardiac:  normal S1, S2; RR Lungs:  clear to auscultation bilaterally, no wheezing, rhonchi or rales  Abd: soft, nontender, no hepatomegaly  Ext: no edema Musculoskeletal:  No deformities, BUE and BLE strength normal and equal Skin: warm and dry  Neuro:  CNs 2-12 intact, no focal abnormalities noted Psych:  Normal affect   EKG:  The EKG was personally reviewed and demonstrates:   NSR .   Old Inf. MI, Old ant. MI  Telemetry:  Telemetry was personally reviewed and demonstrates:  NSR   Relevant CV Studies:   Laboratory Data:  Chemistry Recent Labs  Lab 08/06/17 1358 08/07/17 0610  NA 139 139  K 4.8 4.3  CL 103 105  CO2 30 25  GLUCOSE 199* 170*  BUN 15 16  CREATININE 1.07 1.01  CALCIUM 9.6 9.2  GFRNONAA >60 >60  GFRAA >60 >60  ANIONGAP 6 9    No results for input(s): PROT, ALBUMIN, AST, ALT, ALKPHOS, BILITOT in the last 168 hours. Hematology Recent Labs  Lab 08/06/17 1358 08/07/17 0610  WBC 6.3 6.9  RBC 4.49 4.44  HGB 14.7 14.7  HCT 42.3 42.1  MCV 94.3 94.9  MCH 32.8 33.2  MCHC 34.8 35.0  RDW 14.7* 15.0*  PLT 143* 128*   Cardiac Enzymes Recent Labs  Lab 08/06/17 1358 08/06/17 1825 08/06/17 2345 08/07/17 0610  TROPONINI 0.15* 0.15* 0.12*  0.13*   No results for input(s): TROPIPOC in the last 168 hours.  BNP Recent Labs  Lab 08/06/17 1358  BNP 107.0*    DDimer No results for input(s): DDIMER in the last 168 hours.  Radiology/Studies:  Dg Chest 2 View  Result Date: 08/06/2017 CLINICAL DATA:  Cough and shortness of breath EXAM: CHEST  2 VIEW COMPARISON:  Nov 29, 2015 FINDINGS: There is interstitial thickening bilaterally. There is no edema or consolidation. Heart is mildly enlarged with pulmonary vascularity within normal limits. There is aortic atherosclerosis. No evident bone lesions. IMPRESSION: Chronic interstitial thickening, stable. No edema or consolidation. Stable cardiac prominence. There is aortic atherosclerosis. No evident adenopathy. Aortic Atherosclerosis (ICD10-I70.0). Electronically Signed   By: Lowella Grip III M.D.   On: 08/06/2017 14:49   Ct Angio Chest Pe W And/or Wo Contrast  Result Date: 08/06/2017 CLINICAL DATA:  Cough and shortness of breath for 2-3 days. EXAM: CT ANGIOGRAPHY CHEST WITH CONTRAST TECHNIQUE: Multidetector CT imaging of the chest was performed using the standard protocol during bolus administration of intravenous contrast. Multiplanar CT image reconstructions and MIPs were obtained to evaluate the vascular anatomy. CONTRAST:  66m ISOVUE-370 IOPAMIDOL (ISOVUE-370) INJECTION 76% COMPARISON:  Chest radiograph 08/06/2017, body CT 04/29/2014 FINDINGS: Cardiovascular: Satisfactory opacification of the pulmonary arteries to the segmental level. No evidence of pulmonary embolism. Normal heart size. No pericardial effusion. Calcific atherosclerotic disease of the coronary arteries. Mediastinum/Nodes: Enlarged anterior mediastinal lymph node at right pretracheal station measures 1.6 cm in short axis. There is also right hilar lymphadenopathy measuring 1.5 cm in short axis. Lungs/Pleura: Perifissural soft tissue right middle lobe nodule has increased to 10 mm, from prior measurement of 5 mm in 2015.  Image 39/89, sequence 6. There is also a 4 mm soft tissue nodule in the right upper lobe, image 19/89, sequence 6. A string of tiny perifissural nodules are seen along  the horizontal fissure, best seen on coronal images, image 38/95, sequence 7. There is a 2-3 mm soft tissue nodule in the right middle lobe, image 44/89, sequence 6. Mild ground-glass opacity airspace consolidation in the lingula with subtle ground-glass nodule measuring 8 mm, best seen on coronal images, image 49/95, sequence 7. Upper Abdomen: No acute abnormality. Musculoskeletal: No chest wall abnormality. No acute osseous findings. 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 Review of the MIP images confirms the above findings. IMPRESSION: No evidence of pulmonary embolus. 10 mm right middle lobe perifissural soft tissue nodule, which has doubled when compared to 2015. Primary lung malignancy versus enlarging intraparenchymal lymph node is of consideration. Several other smaller soft tissue nodules throughout the right lung, including a string of tiny perifissural nodules along the horizontal fissure. Consider one of the following in 3 months for both low-risk and high-risk individuals: (a) repeat chest CT, (b) follow-up PET-CT, or (c) tissue sampling. This recommendation follows the consensus statement: Guidelines for Management of Incidental Pulmonary Nodules Detected on CT Images: From the Fleischner Society 2017; Radiology 2017; 284:228-243. Mild right-sided anterior mediastinal and right perihilar lymphadenopathy, which may be malignant or reactive. Mild ground-glass attenuation airspace opacities in the lingula, likely infectious/inflammatory. Hypoventilatory changes in bilateral lower lobes. Mild calcific atherosclerotic disease of the coronary arteries. Aortic Atherosclerosis (ICD10-I70.0). Electronically Signed   By: Fidela Salisbury M.D.   On: 08/06/2017 16:14    Assessment and Plan:   1. NSTEMI :     Patient has a history of known coronary artery disease.  He presented with increasing shortness of breath and was found to have positive troponin levels.  He is feeling quite a bit better now.    His last troponin level is 0.13.  His EKG shows an old inferior wall myocardial infarction and an old anterior wall myocardial infarction.  There is no acute ST or T wave changes.  Given his symptoms and the fact that he has known coronary artery disease, I think that he needs a heart catheterization on Monday prior to discharge.  We will keep him on heparin for now.  2.  Chronic diastolic congestive heart failure: The echocardiogram reveals a left ventricular ejection fraction of 55-60%.  He has grade 2 diastolic dysfunction.   3.  Hyperlipidemia: Continue atorvastatin.  His last cholesterol level was 70 with an LDL of 2.  His HDL is 27.  His triglyceride level is 205.  We discussed improving his diet.  4.  Hypertension: His blood pressure remains fairly stable.  Continue current medications.    For questions or updates, please contact [place] Please consult [email or web address] for contact info under Cardiology/STEMI.   Signed, Mertie Moores, MD  08/07/2017 12:23 PM

## 2017-08-07 NOTE — Progress Notes (Signed)
[facility] [place] NAME: Larry Terrell    MR#:  [id number]  DATE OF BIRTH:  01/30/1961  SUBJECTIVE:  Came in with shortness of breath and some chest discomfort. He was hungry and just ate his lunch REVIEW OF SYSTEMS:   Review of Systems  Constitutional: Negative for chills, fever and weight loss.  HENT: Negative for ear discharge, ear pain and nosebleeds.   Eyes: Negative for blurred vision, pain and discharge.  Respiratory: Negative for sputum production, shortness of breath, wheezing and stridor.   Cardiovascular: Negative for chest pain, palpitations, orthopnea and PND.  Gastrointestinal: Negative for abdominal pain, diarrhea, nausea and vomiting.  Genitourinary: Negative for frequency and urgency.  Musculoskeletal: Negative for back pain and joint pain.  Neurological: Positive for weakness. Negative for sensory change, speech change and focal weakness.  Psychiatric/Behavioral: Negative for depression and hallucinations. The patient is not nervous/anxious.    Tolerating Diet:yes Tolerating PT: not needed  DRUG ALLERGIES:   Allergies  Allergen Reactions  . Ace Inhibitors Other (See Comments)    Pt reports ace inhibitors make him cough    VITALS:  Blood pressure (!) 141/76, pulse 62, temperature 97.6 F (36.4 C), temperature source Oral, resp. rate 18, height 5' 7"  (1.702 m), weight 90.1 kg (198 lb 9.6 oz), SpO2 100 %.  PHYSICAL EXAMINATION:   Physical Exam  GENERAL:  57 y.o.-year-old patient lying in the bed with no acute distress.  EYES: Pupils equal, round, reactive to [place] and accommodation. No scleral icterus. Extraocular muscles intact.  HEENT: Head atraumatic, normocephalic. Oropharynx and nasopharynx clear.  NECK:  Supple, no jugular venous distention. No thyroid enlargement, no tenderness.  LUNGS: Normal breath sounds bilaterally, no wheezing, rales, rhonchi. No use of accessory muscles of respiration.   CARDIOVASCULAR: S1, S2 normal. No murmurs, rubs, or gallops.  ABDOMEN: Soft, nontender, nondistended. Bowel sounds present. No organomegaly or mass.  EXTREMITIES: No cyanosis, clubbing or edema b/l.    NEUROLOGIC: Cranial nerves II through XII are intact. No focal Motor or sensory deficits b/l.   PSYCHIATRIC:  patient is alert and oriented x 3.  SKIN: No obvious rash, lesion, or ulcer.   LABORATORY PANEL:  CBC Recent Labs  Lab 08/07/17 0610  WBC 6.9  HGB 14.7  HCT 42.1  PLT 128*    Chemistries  Recent Labs  Lab 08/07/17 0610  NA 139  K 4.3  CL 105  CO2 25  GLUCOSE 170*  BUN 16  CREATININE 1.01  CALCIUM 9.2   Cardiac Enzymes Recent Labs  Lab 08/07/17 0610  TROPONINI 0.13*   RADIOLOGY:  Dg Chest 2 View  Result Date: 08/06/2017 CLINICAL DATA:  Cough and shortness of breath EXAM: CHEST  2 VIEW COMPARISON:  Nov 29, 2015 FINDINGS: There is interstitial thickening bilaterally. There is no edema or consolidation. Heart is mildly enlarged with pulmonary vascularity within normal limits. There is aortic atherosclerosis. No evident bone lesions. IMPRESSION: Chronic interstitial thickening, stable. No edema or consolidation. Stable cardiac prominence. There is aortic atherosclerosis. No evident adenopathy. Aortic Atherosclerosis (ICD10-I70.0). Electronically Signed   By: Lowella Grip III M.D.   On: 08/06/2017 14:49   Ct Angio Chest Pe W And/or Wo Contrast  Result Date: 08/06/2017 CLINICAL DATA:  Cough and shortness of breath for 2-3 days. EXAM: CT ANGIOGRAPHY CHEST WITH CONTRAST TECHNIQUE: Multidetector CT imaging of the chest was performed using the standard protocol during bolus administration of intravenous contrast. Multiplanar CT image reconstructions and  MIPs were obtained to evaluate the vascular anatomy. CONTRAST:  81m ISOVUE-370 IOPAMIDOL (ISOVUE-370) INJECTION 76% COMPARISON:  Chest radiograph 08/06/2017, body CT 04/29/2014 FINDINGS: Cardiovascular: Satisfactory  opacification of the pulmonary arteries to the segmental level. No evidence of pulmonary embolism. Normal heart size. No pericardial effusion. Calcific atherosclerotic disease of the coronary arteries. Mediastinum/Nodes: Enlarged anterior mediastinal lymph node at right pretracheal station measures 1.6 cm in short axis. There is also right hilar lymphadenopathy measuring 1.5 cm in short axis. Lungs/Pleura: Perifissural soft tissue right middle lobe nodule has increased to 10 mm, from prior measurement of 5 mm in 2015. Image 39/89, sequence 6. There is also a 4 mm soft tissue nodule in the right upper lobe, image 19/89, sequence 6. A string of tiny perifissural nodules are seen along the horizontal fissure, best seen on coronal images, image 38/95, sequence 7. There is a 2-3 mm soft tissue nodule in the right middle lobe, image 44/89, sequence 6. Mild ground-glass opacity airspace consolidation in the lingula with subtle ground-glass nodule measuring 8 mm, best seen on coronal images, image 49/95, sequence 7. Upper Abdomen: No acute abnormality. Musculoskeletal: No chest wall abnormality. No acute osseous findings. 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 Review of the MIP images confirms the above findings. IMPRESSION: No evidence of pulmonary embolus. 10 mm right middle lobe perifissural soft tissue nodule, which has doubled when compared to 2015. Primary lung malignancy versus enlarging intraparenchymal lymph node is of consideration. Several other smaller soft tissue nodules throughout the right lung, including a string of tiny perifissural nodules along the horizontal fissure. Consider one of the following in 3 months for both low-risk and high-risk individuals: (a) repeat chest CT, (b) follow-up PET-CT, or (c) tissue sampling. This recommendation follows the consensus statement: Guidelines for Management of Incidental Pulmonary Nodules Detected on CT Images: From the  Fleischner Society 2017; Radiology 2017; 284:228-243. Mild right-sided anterior mediastinal and right perihilar lymphadenopathy, which may be malignant or reactive. Mild ground-glass attenuation airspace opacities in the lingula, likely infectious/inflammatory. Hypoventilatory changes in bilateral lower lobes. Mild calcific atherosclerotic disease of the coronary arteries. Aortic Atherosclerosis (ICD10-I70.0). Electronically Signed   By: DFidela SalisburyM.D.   On: 08/06/2017 16:14   ASSESSMENT AND PLAN:   TDoyt Castellana is a 57y.o. male with a known history of chronic diastolic heart failure with preserved ejection fraction, COPD, OSA not compliant with CPAP, diabetes and CAD who presents with shortness of breath. Patient reports over the past several days he has had on and off shortness of breath associated with some chest discomfort  1. Non-ST elevation MCH:ENIDPOEthat shortness of breath and chest discomfort are cardiac related. CT chest did not show evidence of pulmonary by, pneumonia or CHF. Continue aspirin, statin and metoprolol Nitroglycerin patch ordered chmg cardiology consultation with Dr nKristopher Oppenheimnoted--recommends cath on Monday -Patient has history of CAD status [facility] 2. Diabetes: Continue NPH with sliding scale and ADA diet  3. Hyperlipidemia: Continue Lipitor  4. OSA: Patient is not compliant with CPAP and does not want to wear CPAP while in the hospital  5. COPD without signs of exacerbation  6. Depression: Continue Effexor  7. Chronic diastolic heart failure: Patient does not appear to be in exacerbation at this time  Discussed with patient and wife Case discussed with Care Management/Social Worker. Management plans discussed with the patient, family and they are in agreement.  CODE STATUS: full  DVT Prophylaxis: lovenox  TOTAL TIME TAKING CARE OF THIS PATIENT: *30* minutes.  >50% time spent on counselling  and coordination of care  POSSIBLE D/C IN *1-2 DAYS, DEPENDING ON CLINICAL CONDITION.  Note: This dictation was prepared with Dragon dictation along with smaller phrase technology. Any transcriptional errors that result from this process are unintentional.  Fritzi Mandes M.D on 08/07/2017 at 2:19 PM  Between 7am to 6pm - Pager - [phone]  After 6pm go to [email or web address] - password [email or web address]  [facility]  Office  [phone]  CC: Primary care physician; Patient, No Pcp PerPatient ID: Larry Terrell, male   DOB: 06-Nov-1960, 57 y.o.   MRN: [id number]

## 2017-08-07 NOTE — Consult Note (Signed)
ANTICOAGULATION CONSULT NOTE - Initial Consult  Pharmacy Consult for heparin drip Indication: chest pain/ACS  Allergies  Allergen Reactions  . Ace Inhibitors Other (See Comments)    Pt reports ace inhibitors make him cough    Patient Measurements: Height: 5' 7"  (170.2 cm) Weight: 200 lb (90.7 kg) IBW/kg (Calculated) : 66.1 Heparin Dosing Weight: 85.1kg  Vital Signs: Temp: 97.9 F (36.6 C) (01/25 2032) Temp Source: Oral (01/25 2032) BP: 154/82 (01/25 2032) Pulse Rate: 83 (01/25 2032)  Labs: Recent Labs    08/06/17 1358 08/06/17 1417 08/06/17 1825 08/06/17 2345  HGB 14.7  --   --   --   HCT 42.3  --   --   --   PLT 143*  --   --   --   APTT  --  35  --   --   LABPROT  --  13.3  --   --   INR  --  1.02  --   --   HEPARINUNFRC  --   --   --  0.22*  CREATININE 1.07  --   --   --   TROPONINI 0.15*  --  0.15* 0.12*    Estimated Creatinine Clearance: 80.8 mL/min (by C-G formula based on SCr of 1.07 mg/dL).   Medical History: Past Medical History:  Diagnosis Date  . CHF (congestive heart failure) (HCC)    diastolic (echo at [facility] 5681) EF 55-60%  . Cirrhosis ([place])    NASH per records  . COPD (chronic obstructive pulmonary disease) ([place])   . Depression   . Diabetes mellitus with neuropathy ([place])   . Heart attack ([place])   . Neuropathy   . OSA (obstructive sleep apnea)   . Peripheral vascular disease due to secondary diabetes mellitus ([place])   . Splenomegaly   . Temporal giant cell arteritis ([place])   . TIA (transient ischemic attack) April 2016    Medications:  Scheduled:  . aspirin  81 mg Oral Daily  . atorvastatin  20 mg Oral Daily  . citalopram  40 mg Oral Daily  . furosemide  20 mg Oral Daily  . gabapentin  900 mg Oral TID  . heparin  1,300 Units Intravenous Once  . insulin aspart  0-5 Units Subcutaneous QHS  . insulin aspart  0-9 Units Subcutaneous TID WC  . insulin aspart protamine- aspart  10 Units Subcutaneous BID WC  . metoprolol tartrate  50 mg Oral  BID  . nitroGLYCERIN  0.5 inch Topical Q6H  . prednisoLONE acetate  1 drop Left Eye QID  . venlafaxine XR  150 mg Oral Q breakfast    Assessment: Patient is a 57 year old male who presents with SOB, chest discomfort. CT neg for PE but positive for mass/node. Pt has elevated troponin. Pharmacy consulted to dose heparin drip. Cardiology has been consulted. CBC relatively WNL. Baseline INR and APTT ordered as add on.  Goal of Therapy:  Heparin level 0.3-0.7 units/ml Monitor platelets by anticoagulation protocol: Yes   Plan:  Give 4000 units bolus x 1 Start heparin infusion at 1000 units/hr Check anti-Xa level in 6 hours and daily while on heparin Continue to monitor H&H and platelets   1/25 2345 HL 0.22. 1300 unit bolus and increase rate to 150 units/hr. Recheck in 6 hours.  Sim Boast, PharmD, BCPS  08/07/17 1:50 AM

## 2017-08-07 NOTE — Consult Note (Signed)
[place] for heparin drip Indication: chest pain/ACS  Allergies  Allergen Reactions  . Ace Inhibitors Other (See Comments)    Pt reports ace inhibitors make him cough    Patient Measurements: Height: 5' 7"  (170.2 cm) Weight: 198 lb 9.6 oz (90.1 kg) IBW/kg (Calculated) : 66.1 Heparin Dosing Weight: 85.1kg  Vital Signs: Temp: 97.6 F (36.4 C) (01/26 0737) Temp Source: Oral (01/26 0737) BP: 142/76 (01/26 1700) Pulse Rate: 67 (01/26 1700)  Labs: Recent Labs    08/06/17 1358 08/06/17 1417 08/06/17 1825 08/06/17 2345 08/07/17 0610 08/07/17 0933 08/07/17 1630  HGB 14.7  --   --   --  14.7  --   --   HCT 42.3  --   --   --  42.1  --   --   PLT 143*  --   --   --  128*  --   --   APTT  --  35  --   --   --   --   --   LABPROT  --  13.3  --   --   --   --   --   INR  --  1.02  --   --   --   --   --   HEPARINUNFRC  --   --   --  0.22*  --  0.34 0.21*  CREATININE 1.07  --   --   --  1.01  --   --   TROPONINI 0.15*  --  0.15* 0.12* 0.13*  --   --     Estimated Creatinine Clearance: 85.4 mL/min (by C-G formula based on SCr of 1.01 mg/dL).   Assessment: Patient is a 57 year old male who presents with SOB, chest discomfort. CT neg for PE but positive for mass/node. Pt has elevated troponin. Pharmacy consulted to dose heparin drip. Cardiology has been consulted. CBC relatively WNL. Baseline INR and APTT ordered as add on.  Goal of Therapy:  Heparin level 0.3-0.7 units/ml Monitor platelets by anticoagulation protocol: Yes   Plan:  Give 4000 units bolus x 1 Start heparin infusion at 1000 units/hr Check anti-Xa level in 6 hours and daily while on heparin Continue to monitor H&H and platelets   1/25 2345 HL 0.22. 1300 unit bolus and increase rate to 150 units/hr. Recheck in 6 hours.  1/26 0933 HL 0.34, within goal range. Will continue heparin drip at current rate and recheck in 6h to confirm. CBC in AM.  1/26 1630 HL subtherapeutic  x 1. 1300 units IV x 1 bolus and increase rate to 1300 units/hr. Will recheck HL in 6 hours.  Aveon Colquhoun A. Jordan Hawks, PharmD, BCPS Clinical Pharmacist 08/07/2017 5:30 PM

## 2017-08-07 NOTE — Plan of Care (Signed)
  Progressing Education: Knowledge of General Education information will improve 08/07/2017 2052 - Progressing by Tad Moore, RN Health Behavior/Discharge Planning: Ability to manage health-related needs will improve 08/07/2017 2052 - Progressing by Tad Moore, RN Clinical Measurements: Ability to maintain clinical measurements within normal limits will improve 08/07/2017 2052 - Progressing by Tad Moore, RN Will remain free from infection 08/07/2017 2052 - Progressing by Tad Moore, RN Respiratory complications will improve 08/07/2017 2052 - Progressing by Tad Moore, RN Activity: Risk for activity intolerance will decrease 08/07/2017 2052 - Progressing by Tad Moore, RN Nutrition: Adequate nutrition will be maintained 08/07/2017 2052 - Progressing by Tad Moore, RN Coping: Level of anxiety will decrease 08/07/2017 2052 - Progressing by Tad Moore, RN Elimination: Will not experience complications related to bowel motility 08/07/2017 2052 - Progressing by Tad Moore, RN Will not experience complications related to urinary retention 08/07/2017 2052 - Progressing by Tad Moore, RN Pain Managment: General experience of comfort will improve 08/07/2017 2052 - Progressing by Tad Moore, RN Safety: Ability to remain free from injury will improve 08/07/2017 2052 - Progressing by Tad Moore, RN Skin Integrity: Risk for impaired skin integrity will decrease 08/07/2017 2052 - Progressing by Tad Moore, RN

## 2017-08-07 NOTE — Consult Note (Signed)
[facility] for heparin drip Indication: chest pain/ACS  Allergies  Allergen Reactions  . Ace Inhibitors Other (See Comments)    Pt reports ace inhibitors make him cough    Patient Measurements: Height: 5' 7"  (170.2 cm) Weight: 198 lb 9.6 oz (90.1 kg) IBW/kg (Calculated) : 66.1 Heparin Dosing Weight: 85.1kg  Vital Signs: Temp: 97.6 F (36.4 C) (01/26 0737) Temp Source: Oral (01/26 0737) BP: 141/76 (01/26 0737) Pulse Rate: 62 (01/26 0737)  Labs: Recent Labs    08/06/17 1358 08/06/17 1417 08/06/17 1825 08/06/17 2345 08/07/17 0610 08/07/17 0933  HGB 14.7  --   --   --  14.7  --   HCT 42.3  --   --   --  42.1  --   PLT 143*  --   --   --  128*  --   APTT  --  35  --   --   --   --   LABPROT  --  13.3  --   --   --   --   INR  --  1.02  --   --   --   --   HEPARINUNFRC  --   --   --  0.22*  --  0.34  CREATININE 1.07  --   --   --  1.01  --   TROPONINI 0.15*  --  0.15* 0.12* 0.13*  --     Estimated Creatinine Clearance: 85.4 mL/min (by C-G formula based on SCr of 1.01 mg/dL).   Assessment: Patient is a 57 year old male who presents with SOB, chest discomfort. CT neg for PE but positive for mass/node. Pt has elevated troponin. Pharmacy consulted to dose heparin drip. Cardiology has been consulted. CBC relatively WNL. Baseline INR and APTT ordered as add on.  Goal of Therapy:  Heparin level 0.3-0.7 units/ml Monitor platelets by anticoagulation protocol: Yes   Plan:  Give 4000 units bolus x 1 Start heparin infusion at 1000 units/hr Check anti-Xa level in 6 hours and daily while on heparin Continue to monitor H&H and platelets   1/25 2345 HL 0.22. 1300 unit bolus and increase rate to 150 units/hr. Recheck in 6 hours.  1/26 0933 HL 0.34, within goal range. Will continue heparin drip at current rate and recheck in 6h to confirm. CBC in AM.  Rayna Sexton, PharmD, BCPS Clinical Pharmacist 08/07/2017 10:21 AM

## 2017-08-07 NOTE — Plan of Care (Addendum)
  Progressing Education: Knowledge of General Education information will improve 08/07/2017 0053 - Progressing by Tad Moore, RN Health Behavior/Discharge Planning: Ability to manage health-related needs will improve 08/07/2017 0053 - Progressing by Tad Moore, RN Clinical Measurements: Ability to maintain clinical measurements within normal limits will improve 08/07/2017 0053 - Progressing by Tad Moore, RN Will remain free from infection 08/07/2017 0053 - Progressing by Tad Moore, RN Respiratory complications will improve 08/07/2017 0053 - Progressing by Tad Moore, RN Activity: Risk for activity intolerance will decrease 08/07/2017 0053 - Progressing by Tad Moore, RN Nutrition: Adequate nutrition will be maintained 08/07/2017 0053 - Progressing by Tad Moore, RN Coping: Level of anxiety will decrease 08/07/2017 0053 - Progressing by Tad Moore, RN Elimination: Will not experience complications related to bowel motility 08/07/2017 0053 - Progressing by Tad Moore, RN Will not experience complications related to urinary retention 08/07/2017 0053 - Progressing by Tad Moore, RN Pain Managment: General experience of comfort will improve 08/07/2017 0053 - Progressing by Tad Moore, RN Safety: Ability to remain free from injury will improve 08/07/2017 0053 - Progressing by Tad Moore, RN Skin Integrity: Risk for impaired skin integrity will decrease 08/07/2017 0053 - Progressing by Tad Moore, RN

## 2017-08-07 NOTE — Plan of Care (Signed)
  Progressing Education: Knowledge of General Education information will improve 08/07/2017 1142 - Progressing by Rolley Sims, RN Health Behavior/Discharge Planning: Ability to manage health-related needs will improve 08/07/2017 1142 - Progressing by Rolley Sims, RN Clinical Measurements: Will remain free from infection 08/07/2017 1142 - Progressing by Rolley Sims, RN Respiratory complications will improve 08/07/2017 1142 - Progressing by Rolley Sims, RN

## 2017-08-07 NOTE — Progress Notes (Signed)
Patient's CBG=223 mg/dL and he didn't have any sliding scale. Dr. Jannifer Franklin notified and received an new order for sliding scale. Will administer and continue to monitor.

## 2017-08-08 DIAGNOSIS — I5032 Chronic diastolic (congestive) heart failure: Secondary | ICD-10-CM

## 2017-08-08 DIAGNOSIS — I10 Essential (primary) hypertension: Secondary | ICD-10-CM

## 2017-08-08 LAB — HEPARIN LEVEL (UNFRACTIONATED)
Heparin Unfractionated: 0.37 IU/mL (ref 0.30–0.70)
Heparin Unfractionated: 0.38 IU/mL (ref 0.30–0.70)

## 2017-08-08 LAB — CBC
HEMATOCRIT: 39.8 % — AB (ref 40.0–52.0)
HEMOGLOBIN: 14.3 g/dL (ref 13.0–18.0)
MCH: 33.3 pg (ref 26.0–34.0)
MCHC: 35.8 g/dL (ref 32.0–36.0)
MCV: 93 fL (ref 80.0–100.0)
Platelets: 155 10*3/uL (ref 150–440)
RBC: 4.28 MIL/uL — AB (ref 4.40–5.90)
RDW: 15.3 % — ABNORMAL HIGH (ref 11.5–14.5)
WBC: 8.2 10*3/uL (ref 3.8–10.6)

## 2017-08-08 LAB — GLUCOSE, CAPILLARY
GLUCOSE-CAPILLARY: 152 mg/dL — AB (ref 65–99)
Glucose-Capillary: 194 mg/dL — ABNORMAL HIGH (ref 65–99)
Glucose-Capillary: 212 mg/dL — ABNORMAL HIGH (ref 65–99)

## 2017-08-08 LAB — HIV ANTIBODY (ROUTINE TESTING W REFLEX): HIV SCREEN 4TH GENERATION: NONREACTIVE

## 2017-08-08 MED ORDER — SODIUM CHLORIDE 0.9% FLUSH
3.0000 mL | Freq: Two times a day (BID) | INTRAVENOUS | Status: DC
  Administered 2017-08-08: 3 mL via INTRAVENOUS

## 2017-08-08 MED ORDER — SODIUM CHLORIDE 0.9 % WEIGHT BASED INFUSION
1.0000 mL/kg/h | INTRAVENOUS | Status: DC
  Administered 2017-08-09: 1 mL/kg/h via INTRAVENOUS

## 2017-08-08 MED ORDER — SODIUM CHLORIDE 0.9 % WEIGHT BASED INFUSION
3.0000 mL/kg/h | INTRAVENOUS | Status: DC
  Administered 2017-08-09: 3 mL/kg/h via INTRAVENOUS

## 2017-08-08 MED ORDER — ASPIRIN 81 MG PO CHEW
81.0000 mg | CHEWABLE_TABLET | ORAL | Status: AC
  Administered 2017-08-09: 81 mg via ORAL
  Filled 2017-08-08: qty 1

## 2017-08-08 MED ORDER — SODIUM CHLORIDE 0.9% FLUSH: 3.0000 mL | INTRAVENOUS | Status: DC | PRN

## 2017-08-08 MED ORDER — SODIUM CHLORIDE 0.9 % IV SOLN: 250.0000 mL | INTRAVENOUS | Status: DC | PRN

## 2017-08-08 NOTE — Plan of Care (Signed)
  Progressing Education: Knowledge of General Education information will improve 08/08/2017 2231 - Progressing by Tad Moore, RN Health Behavior/Discharge Planning: Ability to manage health-related needs will improve 08/08/2017 2231 - Progressing by Tad Moore, RN Clinical Measurements: Ability to maintain clinical measurements within normal limits will improve 08/08/2017 2231 - Progressing by Tad Moore, RN Will remain free from infection 08/08/2017 2231 - Progressing by Tad Moore, RN Diagnostic test results will improve 08/08/2017 2231 - Progressing by Tad Moore, RN Respiratory complications will improve 08/08/2017 2231 - Progressing by Tad Moore, RN Cardiovascular complication will be avoided 08/08/2017 2231 - Progressing by Tad Moore, RN Activity: Risk for activity intolerance will decrease 08/08/2017 2231 - Progressing by Tad Moore, RN Nutrition: Adequate nutrition will be maintained 08/08/2017 2231 - Progressing by Tad Moore, RN Coping: Level of anxiety will decrease 08/08/2017 2231 - Progressing by Tad Moore, RN Elimination: Will not experience complications related to bowel motility 08/08/2017 2231 - Progressing by Tad Moore, RN Will not experience complications related to urinary retention 08/08/2017 2231 - Progressing by Tad Moore, RN Pain Managment: General experience of comfort will improve 08/08/2017 2231 - Progressing by Tad Moore, RN Safety: Ability to remain free from injury will improve 08/08/2017 2231 - Progressing by Tad Moore, RN Skin Integrity: Risk for impaired skin integrity will decrease 08/08/2017 2231 - Progressing by Tad Moore, RN

## 2017-08-08 NOTE — Progress Notes (Signed)
[facility] NAME: Larry Terrell    MR#:  [id number]  DATE OF BIRTH:  1960-07-30  SUBJECTIVE:  Came in with shortness of breath and some chest discomfort.  No new complaints today REVIEW OF SYSTEMS:   Review of Systems  Constitutional: Negative for chills, fever and weight loss.  HENT: Negative for ear discharge, ear pain and nosebleeds.   Eyes: Negative for blurred vision, pain and discharge.  Respiratory: Negative for sputum production, shortness of breath, wheezing and stridor.   Cardiovascular: Negative for chest pain, palpitations, orthopnea and PND.  Gastrointestinal: Negative for abdominal pain, diarrhea, nausea and vomiting.  Genitourinary: Negative for frequency and urgency.  Musculoskeletal: Negative for back pain and joint pain.  Neurological: Positive for weakness. Negative for sensory change, speech change and focal weakness.  Psychiatric/Behavioral: Negative for depression and hallucinations. The patient is not nervous/anxious.    Tolerating Diet:yes Tolerating PT: not needed  DRUG ALLERGIES:   Allergies  Allergen Reactions  . Ace Inhibitors Other (See Comments)    Pt reports ace inhibitors make him cough    VITALS:  Blood pressure 139/65, pulse 62, temperature 97.7 F (36.5 C), temperature source Oral, resp. rate 18, height 5' 7"  (1.702 m), weight 90.9 kg (200 lb 6.4 oz), SpO2 94 %.  PHYSICAL EXAMINATION:   Physical Exam  GENERAL:  57 y.o.-year-old patient lying in the bed with no acute distress.  EYES: Pupils equal, round, reactive to Terrio and accommodation. No scleral icterus. Extraocular muscles intact.  HEENT: Head atraumatic, normocephalic. Oropharynx and nasopharynx clear.  NECK:  Supple, no jugular venous distention. No thyroid enlargement, no tenderness.  LUNGS: Normal breath sounds bilaterally, no wheezing, rales, rhonchi. No use of accessory muscles of respiration.  CARDIOVASCULAR: S1, S2  normal. No murmurs, rubs, or gallops.  ABDOMEN: Soft, nontender, nondistended. Bowel sounds present. No organomegaly or mass.  EXTREMITIES: No cyanosis, clubbing or edema b/l.    NEUROLOGIC: Cranial nerves II through XII are intact. No focal Motor or sensory deficits b/l.   PSYCHIATRIC:  patient is alert and oriented x 3.  SKIN: No obvious rash, lesion, or ulcer.   LABORATORY PANEL:  CBC Recent Labs  Lab 08/08/17 0000  WBC 8.2  HGB 14.3  HCT 39.8*  PLT 155    Chemistries  Recent Labs  Lab 08/07/17 0610  NA 139  K 4.3  CL 105  CO2 25  GLUCOSE 170*  BUN 16  CREATININE 1.01  CALCIUM 9.2   Cardiac Enzymes Recent Labs  Lab 08/07/17 0610  TROPONINI 0.13*   RADIOLOGY:  Dg Chest 2 View  Result Date: 08/06/2017 CLINICAL DATA:  Cough and shortness of breath EXAM: CHEST  2 VIEW COMPARISON:  Nov 29, 2015 FINDINGS: There is interstitial thickening bilaterally. There is no edema or consolidation. Heart is mildly enlarged with pulmonary vascularity within normal limits. There is aortic atherosclerosis. No evident bone lesions. IMPRESSION: Chronic interstitial thickening, stable. No edema or consolidation. Stable cardiac prominence. There is aortic atherosclerosis. No evident adenopathy. Aortic Atherosclerosis (ICD10-I70.0). Electronically Signed   By: Lowella Grip III M.D.   On: 08/06/2017 14:49   Ct Angio Chest Pe W And/or Wo Contrast  Result Date: 08/06/2017 CLINICAL DATA:  Cough and shortness of breath for 2-3 days. EXAM: CT ANGIOGRAPHY CHEST WITH CONTRAST TECHNIQUE: Multidetector CT imaging of the chest was performed using the standard protocol during bolus administration of intravenous contrast. Multiplanar CT image reconstructions and MIPs were obtained to  evaluate the vascular anatomy. CONTRAST:  75m ISOVUE-370 IOPAMIDOL (ISOVUE-370) INJECTION 76% COMPARISON:  Chest radiograph 08/06/2017, body CT 04/29/2014 FINDINGS: Cardiovascular: Satisfactory opacification of the  pulmonary arteries to the segmental level. No evidence of pulmonary embolism. Normal heart size. No pericardial effusion. Calcific atherosclerotic disease of the coronary arteries. Mediastinum/Nodes: Enlarged anterior mediastinal lymph node at right pretracheal station measures 1.6 cm in short axis. There is also right hilar lymphadenopathy measuring 1.5 cm in short axis. Lungs/Pleura: Perifissural soft tissue right middle lobe nodule has increased to 10 mm, from prior measurement of 5 mm in 2015. Image 39/89, sequence 6. There is also a 4 mm soft tissue nodule in the right upper lobe, image 19/89, sequence 6. A string of tiny perifissural nodules are seen along the horizontal fissure, best seen on coronal images, image 38/95, sequence 7. There is a 2-3 mm soft tissue nodule in the right middle lobe, image 44/89, sequence 6. Mild ground-glass opacity airspace consolidation in the lingula with subtle ground-glass nodule measuring 8 mm, best seen on coronal images, image 49/95, sequence 7. Upper Abdomen: No acute abnormality. Musculoskeletal: No chest wall abnormality. No acute osseous findings. 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 Review of the MIP images confirms the above findings. IMPRESSION: No evidence of pulmonary embolus. 10 mm right middle lobe perifissural soft tissue nodule, which has doubled when compared to 2015. Primary lung malignancy versus enlarging intraparenchymal lymph node is of consideration. Several other smaller soft tissue nodules throughout the right lung, including a string of tiny perifissural nodules along the horizontal fissure. Consider one of the following in 3 months for both low-risk and high-risk individuals: (a) repeat chest CT, (b) follow-up PET-CT, or (c) tissue sampling. This recommendation follows the consensus statement: Guidelines for Management of Incidental Pulmonary Nodules Detected on CT Images: From the Fleischner Society 2017;  Radiology 2017; 284:228-243. Mild right-sided anterior mediastinal and right perihilar lymphadenopathy, which may be malignant or reactive. Mild ground-glass attenuation airspace opacities in the lingula, likely infectious/inflammatory. Hypoventilatory changes in bilateral lower lobes. Mild calcific atherosclerotic disease of the coronary arteries. Aortic Atherosclerosis (ICD10-I70.0). Electronically Signed   By: DFidela SalisburyM.D.   On: 08/06/2017 16:14   ASSESSMENT AND PLAN:   TDonna Silverman is a 57y.o. male with a known history of chronic diastolic heart failure with preserved ejection fraction, COPD, OSA not compliant with CPAP, diabetes and CAD who presents with shortness of breath. Patient reports over the past several days he has had on and off shortness of breath associated with some chest discomfort  1. Non-ST elevation MNF:AOZHYQMthat shortness of breath and chest discomfort are cardiac related. CT chest did not show evidence of pulmonary by, pneumonia or CHF. Continue aspirin, statin and metoprolol Nitroglycerin patch ordered chmg cardiology consultation with Dr nKristopher Oppenheimnoted--recommends cath on Monday -Patient has history of CAD status post [facility] 2. Diabetes: Continue NPH with sliding scale and ADA diet  3. Hyperlipidemia: Continue Lipitor  4. OSA: Patient is not compliant with CPAP and does not want to wear CPAP while in the hospital  5. COPD without signs of exacerbation  6. Depression: Continue Effexor  7. Chronic diastolic heart failure: Patient does not appear to be in exacerbation at this time  Discussed with patient and wife Case discussed with Care Management/Social Worker. Management plans discussed with the patient, family and they are in agreement.  CODE STATUS: full  DVT Prophylaxis: lovenox  TOTAL TIME TAKING CARE OF THIS PATIENT: *30* minutes.  >50% time spent on counselling and coordination of  care  POSSIBLE D/C IN *1-2 DAYS, DEPENDING ON CLINICAL CONDITION.  Note: This dictation was prepared with Dragon dictation along with smaller phrase technology. Any transcriptional errors that result from this process are unintentional.  Fritzi Mandes M.D on 08/08/2017 at 1:01 PM  Between 7am to 6pm - Pager - [phone]  After 6pm go to [email or web address] - password [email or web address]  [facility]  Office  [phone]  CC: Primary care physician; Patient, No Pcp PerPatient ID: Larry Terrell, male   DOB: [birth year], 59 y.o.   MRN: [id number]

## 2017-08-08 NOTE — Progress Notes (Signed)
Progress Note  Patient Name: Larry Terrell Date of Encounter: 08/08/2017  Primary Cardiologist: [facility],  Subjective   Larry Terrell is a 57 year old gentleman with known history of coronary artery disease, chronic diastolic congestive heart failure, COPD, obstructive sleep apnea who was admitted with worsening shortness of breath and positive troponin levels.  He is been on heparin drip.  He is feeling quite a bit better.  Inpatient Medications    Scheduled Meds: . aspirin  81 mg Oral Daily  . atorvastatin  20 mg Oral Daily  . citalopram  40 mg Oral Daily  . furosemide  20 mg Oral Daily  . gabapentin  900 mg Oral TID  . insulin aspart  0-5 Units Subcutaneous QHS  . insulin aspart  0-9 Units Subcutaneous TID WC  . insulin aspart protamine- aspart  10 Units Subcutaneous BID WC  . metoprolol tartrate  50 mg Oral BID  . nitroGLYCERIN  0.5 inch Topical Q6H  . prednisoLONE acetate  1 drop Left Eye QID  . venlafaxine XR  150 mg Oral Q breakfast   Continuous Infusions: . heparin 1,300 Units/hr (08/08/17 0142)   PRN Meds: acetaminophen **OR** acetaminophen, hydrochlorothiazide, nitroGLYCERIN, ondansetron **OR** ondansetron (ZOFRAN) IV, oxyCODONE-acetaminophen, polyethylene glycol, traMADol   Vital Signs    Vitals:   08/07/17 1854 08/07/17 2300 08/08/17 0505 08/08/17 0745  BP: (!) 145/70 128/67 (!) 117/93 139/65  Pulse: 71 66 70 62  Resp: 16 18 18 18   Temp: 98.3 F (36.8 C)  97.7 F (36.5 C)   TempSrc: Oral  Oral   SpO2: 96% 95% 92% 94%  Weight:   200 lb 6.4 oz (90.9 kg)   Height:        Intake/Output Summary (Last 24 hours) at 08/08/2017 1037 Last data filed at 08/08/2017 0933 Gross per 24 hour  Intake 1120.25 ml  Output 2175 ml  Net -1054.75 ml   Filed Weights   08/06/17 1350 08/07/17 0354 08/08/17 0505  Weight: 200 lb (90.7 kg) 198 lb 9.6 oz (90.1 kg) 200 lb 6.4 oz (90.9 kg)    Telemetry     NSR  - Personally Reviewed  ECG     NSR  - Personally  Reviewed  Physical Exam   GEN: No acute distress.  Middle age man, NAD  Neck: No JVD Cardiac: RRR, no murmurs, rubs, or gallops.  Respiratory: Clear to auscultation bilaterally. GI: Soft, nontender, non-distended  MS: No edema; No deformity. Neuro:  Nonfocal  Psych: Normal affect   Labs    Chemistry Recent Labs  Lab 08/06/17 1358 08/07/17 0610  NA 139 139  K 4.8 4.3  CL 103 105  CO2 30 25  GLUCOSE 199* 170*  BUN 15 16  CREATININE 1.07 1.01  CALCIUM 9.6 9.2  GFRNONAA >60 >60  GFRAA >60 >60  ANIONGAP 6 9     Hematology Recent Labs  Lab 08/06/17 1358 08/07/17 0610 08/08/17 0000  WBC 6.3 6.9 8.2  RBC 4.49 4.44 4.28*  HGB 14.7 14.7 14.3  HCT 42.3 42.1 39.8*  MCV 94.3 94.9 93.0  MCH 32.8 33.2 33.3  MCHC 34.8 35.0 35.8  RDW 14.7* 15.0* 15.3*  PLT 143* 128* 155    Cardiac Enzymes Recent Labs  Lab 08/06/17 1358 08/06/17 1825 08/06/17 2345 08/07/17 0610  TROPONINI 0.15* 0.15* 0.12* 0.13*   No results for input(s): TROPIPOC in the last 168 hours.   BNP Recent Labs  Lab 08/06/17 1358  BNP 107.0*  DDimer No results for input(s): DDIMER in the last 168 hours.   Radiology    Dg Chest 2 View  Result Date: 08/06/2017 CLINICAL DATA:  Cough and shortness of breath EXAM: CHEST  2 VIEW COMPARISON:  Nov 29, 2015 FINDINGS: There is interstitial thickening bilaterally. There is no edema or consolidation. Heart is mildly enlarged with pulmonary vascularity within normal limits. There is aortic atherosclerosis. No evident bone lesions. IMPRESSION: Chronic interstitial thickening, stable. No edema or consolidation. Stable cardiac prominence. There is aortic atherosclerosis. No evident adenopathy. Aortic Atherosclerosis (ICD10-I70.0). Electronically Signed   By: Lowella Grip III M.D.   On: 08/06/2017 14:49   Ct Angio Chest Pe W And/or Wo Contrast  Result Date: 08/06/2017 CLINICAL DATA:  Cough and shortness of breath for 2-3 days. EXAM: CT ANGIOGRAPHY CHEST  WITH CONTRAST TECHNIQUE: Multidetector CT imaging of the chest was performed using the standard protocol during bolus administration of intravenous contrast. Multiplanar CT image reconstructions and MIPs were obtained to evaluate the vascular anatomy. CONTRAST:  102m ISOVUE-370 IOPAMIDOL (ISOVUE-370) INJECTION 76% COMPARISON:  Chest radiograph 08/06/2017, body CT 04/29/2014 FINDINGS: Cardiovascular: Satisfactory opacification of the pulmonary arteries to the segmental level. No evidence of pulmonary embolism. Normal heart size. No pericardial effusion. Calcific atherosclerotic disease of the coronary arteries. Mediastinum/Nodes: Enlarged anterior mediastinal lymph node at right pretracheal station measures 1.6 cm in short axis. There is also right hilar lymphadenopathy measuring 1.5 cm in short axis. Lungs/Pleura: Perifissural soft tissue right middle lobe nodule has increased to 10 mm, from prior measurement of 5 mm in 2015. Image 39/89, sequence 6. There is also a 4 mm soft tissue nodule in the right upper lobe, image 19/89, sequence 6. A string of tiny perifissural nodules are seen along the horizontal fissure, best seen on coronal images, image 38/95, sequence 7. There is a 2-3 mm soft tissue nodule in the right middle lobe, image 44/89, sequence 6. Mild Terrell-glass opacity airspace consolidation in the lingula with subtle Terrell-glass nodule measuring 8 mm, best seen on coronal images, image 49/95, sequence 7. Upper Abdomen: No acute abnormality. Musculoskeletal: No chest wall abnormality. No acute osseous findings. 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 Review of the MIP images confirms the above findings. IMPRESSION: No evidence of pulmonary embolus. 10 mm right middle lobe perifissural soft tissue nodule, which has doubled when compared to 2015. Primary lung malignancy versus enlarging intraparenchymal lymph node is of consideration. Several other smaller soft tissue  nodules throughout the right lung, including a string of tiny perifissural nodules along the horizontal fissure. Consider one of the following in 3 months for both low-risk and high-risk individuals: (a) repeat chest CT, (b) follow-up PET-CT, or (c) tissue sampling. This recommendation follows the consensus statement: Guidelines for Management of Incidental Pulmonary Nodules Detected on CT Images: From the Fleischner Society 2017; Radiology 2017; 284:228-243. Mild right-sided anterior mediastinal and right perihilar lymphadenopathy, which may be malignant or reactive. Mild Terrell-glass attenuation airspace opacities in the lingula, likely infectious/inflammatory. Hypoventilatory changes in bilateral lower lobes. Mild calcific atherosclerotic disease of the coronary arteries. Aortic Atherosclerosis (ICD10-I70.0). Electronically Signed   By: DFidela SalisburyM.D.   On: 08/06/2017 16:14    Cardiac Studies     Patient Profile     57y.o. male with a known history of coronary artery disease, chronic diastolic congestive heart failure who was admitted with increasing shortness of breath and chest pain and positive troponin levels.  Assessment & Plan    1.  Non-ST segment elevation myocardial infarction: Larry Terrell presents with shortness of breath and chest discomfort and has minimal troponin elevations.  He has known coronary artery disease.  I think that we need to proceed with heart catheterization.  We discussed the risks, benefits, options of heart catheterization.  He understands and agrees to proceed.  2.  Chronic diastolic congestive heart failure.  He seems to be fairly stable at this point.  3.  Hyperlipidemia: Continue atorvastatin  For questions or updates, please contact [place] Please consult [email or web address] for contact info under Cardiology/STEMI.      Signed, Mertie Moores, MD  08/08/2017, 10:37 AM

## 2017-08-08 NOTE — Consult Note (Signed)
[place] for heparin drip Indication: chest pain/ACS  Allergies  Allergen Reactions  . Ace Inhibitors Other (See Comments)    Pt reports ace inhibitors make him cough    Patient Measurements: Height: 5' 7"  (170.2 cm) Weight: 200 lb 6.4 oz (90.9 kg) IBW/kg (Calculated) : 66.1 Heparin Dosing Weight: 85.1kg  Vital Signs: Temp: 97.7 F (36.5 C) (01/27 0505) Temp Source: Oral (01/27 0505) BP: 117/93 (01/27 0505) Pulse Rate: 70 (01/27 0505)  Labs: Recent Labs    08/06/17 1358 08/06/17 1417 08/06/17 1825 08/06/17 2345 08/07/17 0610  08/07/17 1630 08/08/17 0000 08/08/17 0647  HGB 14.7  --   --   --  14.7  --   --  14.3  --   HCT 42.3  --   --   --  42.1  --   --  39.8*  --   PLT 143*  --   --   --  128*  --   --  155  --   APTT  --  35  --   --   --   --   --   --   --   LABPROT  --  13.3  --   --   --   --   --   --   --   INR  --  1.02  --   --   --   --   --   --   --   HEPARINUNFRC  --   --   --  0.22*  --    < > 0.21* 0.37 0.38  CREATININE 1.07  --   --   --  1.01  --   --   --   --   TROPONINI 0.15*  --  0.15* 0.12* 0.13*  --   --   --   --    < > = values in this interval not displayed.    Estimated Creatinine Clearance: 85.7 mL/min (by C-G formula based on SCr of 1.01 mg/dL).   Assessment: Patient is a 57 year old male who presents with SOB, chest discomfort. CT neg for PE but positive for mass/node. Pt has elevated troponin. Pharmacy consulted to dose heparin drip. Cardiology has been consulted. CBC relatively WNL. Baseline INR and APTT ordered as add on.  Goal of Therapy:  Heparin level 0.3-0.7 units/ml Monitor platelets by anticoagulation protocol: Yes   Plan:  Give 4000 units bolus x 1 Start heparin infusion at 1000 units/hr Check anti-Xa level in 6 hours and daily while on heparin Continue to monitor H&H and platelets   1/25 2345 HL 0.22. 1300 unit bolus and increase rate to 150 units/hr. Recheck in 6  hours.  1/26 0933 HL 0.34, within goal range. Will continue heparin drip at current rate and recheck in 6h to confirm. CBC in AM.  1/26 1630 HL subtherapeutic x 1. 1300 units IV x 1 bolus and increase rate to 1300 units/hr. Will recheck HL in 6 hours.  1/27 0000 heparin level 0.37. Continue current regimen. Recheck in 6 hours to confirm.  1/27 0650 heparin level 0.38, within goal range. Continue current drip rate. Recheck in AM. Hgb stable, plt count improved. CBC in AM  Rayna Sexton, PharmD, BCPS Clinical Pharmacist 08/08/2017 7:41 AM

## 2017-08-08 NOTE — Consult Note (Signed)
[place] for heparin drip Indication: chest pain/ACS  Allergies  Allergen Reactions  . Ace Inhibitors Other (See Comments)    Pt reports ace inhibitors make him cough    Patient Measurements: Height: 5' 7"  (170.2 cm) Weight: 198 lb 9.6 oz (90.1 kg) IBW/kg (Calculated) : 66.1 Heparin Dosing Weight: 85.1kg  Vital Signs: Temp: 98.3 F (36.8 C) (01/26 1854) Temp Source: Oral (01/26 1854) BP: 128/67 (01/26 2300) Pulse Rate: 66 (01/26 2300)  Labs: Recent Labs    08/06/17 1358 08/06/17 1417 08/06/17 1825  08/06/17 2345 08/07/17 0610 08/07/17 0933 08/07/17 1630 08/08/17 0000  HGB 14.7  --   --   --   --  14.7  --   --  14.3  HCT 42.3  --   --   --   --  42.1  --   --  39.8*  PLT 143*  --   --   --   --  128*  --   --  155  APTT  --  35  --   --   --   --   --   --   --   LABPROT  --  13.3  --   --   --   --   --   --   --   INR  --  1.02  --   --   --   --   --   --   --   HEPARINUNFRC  --   --   --    < > 0.22*  --  0.34 0.21* 0.37  CREATININE 1.07  --   --   --   --  1.01  --   --   --   TROPONINI 0.15*  --  0.15*  --  0.12* 0.13*  --   --   --    < > = values in this interval not displayed.    Estimated Creatinine Clearance: 85.4 mL/min (by C-G formula based on SCr of 1.01 mg/dL).   Assessment: Patient is a 56 year old male who presents with SOB, chest discomfort. CT neg for PE but positive for mass/node. Pt has elevated troponin. Pharmacy consulted to dose heparin drip. Cardiology has been consulted. CBC relatively WNL. Baseline INR and APTT ordered as add on.  Goal of Therapy:  Heparin level 0.3-0.7 units/ml Monitor platelets by anticoagulation protocol: Yes   Plan:  Give 4000 units bolus x 1 Start heparin infusion at 1000 units/hr Check anti-Xa level in 6 hours and daily while on heparin Continue to monitor H&H and platelets   1/25 2345 HL 0.22. 1300 unit bolus and increase rate to 150 units/hr. Recheck in 6  hours.  1/26 0933 HL 0.34, within goal range. Will continue heparin drip at current rate and recheck in 6h to confirm. CBC in AM.  1/26 1630 HL subtherapeutic x 1. 1300 units IV x 1 bolus and increase rate to 1300 units/hr. Will recheck HL in 6 hours.  1/27 0000 heparin level 0.37. Continue current regimen. Recheck in 6 hours to confirm.  Sim Boast, PharmD, BCPS  08/08/17 12:54 AM

## 2017-08-09 ENCOUNTER — Encounter
Admission: EM | Disposition: A | Discharge status: Home / Self Care | Payer: Self-pay | Source: Home / Self Care | Attending: Internal Medicine

## 2017-08-09 HISTORY — PX: CORONARY STENT INTERVENTION: CATH118234

## 2017-08-09 HISTORY — PX: LEFT HEART CATH AND CORONARY ANGIOGRAPHY: CATH118249

## 2017-08-09 LAB — GLUCOSE, CAPILLARY
GLUCOSE-CAPILLARY: 143 mg/dL — AB (ref 65–99)
GLUCOSE-CAPILLARY: 340 mg/dL — AB (ref 65–99)
Glucose-Capillary: 181 mg/dL — ABNORMAL HIGH (ref 65–99)
Glucose-Capillary: 139 mg/dL — ABNORMAL HIGH (ref 65–99)
Glucose-Capillary: 282 mg/dL — ABNORMAL HIGH (ref 65–99)

## 2017-08-09 LAB — CBC
HEMATOCRIT: 41.2 % (ref 40.0–52.0)
HEMOGLOBIN: 14.7 g/dL (ref 13.0–18.0)
MCH: 33.4 pg (ref 26.0–34.0)
MCHC: 35.6 g/dL (ref 32.0–36.0)
MCV: 94.1 fL (ref 80.0–100.0)
Platelets: 128 10*3/uL — ABNORMAL LOW (ref 150–440)
RBC: 4.38 MIL/uL — AB (ref 4.40–5.90)
RDW: 15.3 % — ABNORMAL HIGH (ref 11.5–14.5)
WBC: 6.4 10*3/uL (ref 3.8–10.6)

## 2017-08-09 LAB — CREATININE, SERUM
CREATININE: 1.16 mg/dL (ref 0.61–1.24)
GFR calc non Af Amer: >60 mL/min (ref >60)

## 2017-08-09 LAB — POCT ACTIVATED CLOTTING TIME: ACTIVATED CLOTTING TIME: 307 s

## 2017-08-09 LAB — HEPARIN LEVEL (UNFRACTIONATED)
HEPARIN UNFRACTIONATED: 0.25 [IU]/mL — AB (ref 0.30–0.70)
Heparin Unfractionated: 0.44 IU/mL (ref 0.30–0.70)

## 2017-08-09 SURGERY — LEFT HEART CATH AND CORONARY ANGIOGRAPHY
Anesthesia: Moderate Sedation

## 2017-08-09 MED ORDER — HEPARIN BOLUS VIA INFUSION
1300.0000 [IU] | Freq: Once | INTRAVENOUS | Status: AC
  Administered 2017-08-09: 1300 [IU] via INTRAVENOUS
  Filled 2017-08-09: qty 1300

## 2017-08-09 MED ORDER — CLOPIDOGREL BISULFATE 75 MG PO TABS
75.0000 mg | ORAL_TABLET | Freq: Every day | ORAL | Status: DC
  Administered 2017-08-10: 75 mg via ORAL
  Filled 2017-08-09: qty 1

## 2017-08-09 MED ORDER — VERAPAMIL HCL 2.5 MG/ML IV SOLN
INTRAVENOUS | Status: AC
  Filled 2017-08-09: qty 2

## 2017-08-09 MED ORDER — ENOXAPARIN SODIUM 40 MG/0.4ML ~~LOC~~ SOLN
40.0000 mg | SUBCUTANEOUS | Status: DC
  Administered 2017-08-10: 40 mg via SUBCUTANEOUS
  Filled 2017-08-09: qty 0.4

## 2017-08-09 MED ORDER — SODIUM CHLORIDE 0.9% FLUSH
3.0000 mL | Freq: Two times a day (BID) | INTRAVENOUS | Status: DC
  Administered 2017-08-10: 3 mL via INTRAVENOUS

## 2017-08-09 MED ORDER — IOPAMIDOL (ISOVUE-300) INJECTION 61%
INTRAVENOUS | Status: DC | PRN
  Administered 2017-08-09: 160 mL via INTRAVENOUS

## 2017-08-09 MED ORDER — OXYCODONE-ACETAMINOPHEN 5-325 MG PO TABS
1.0000 | ORAL_TABLET | Freq: Four times a day (QID) | ORAL | Status: DC | PRN
  Administered 2017-08-09 – 2017-08-10 (×2): 1 via ORAL
  Filled 2017-08-09 (×2): qty 1

## 2017-08-09 MED ORDER — HEPARIN SODIUM (PORCINE) 1000 UNIT/ML IJ SOLN
INTRAMUSCULAR | Status: AC
  Filled 2017-08-09: qty 1

## 2017-08-09 MED ORDER — FENTANYL CITRATE (PF) 100 MCG/2ML IJ SOLN
INTRAMUSCULAR | Status: AC
  Filled 2017-08-09: qty 2

## 2017-08-09 MED ORDER — CLOPIDOGREL BISULFATE 75 MG PO TABS
ORAL_TABLET | ORAL | Status: DC | PRN
  Administered 2017-08-09: 600 mg via ORAL

## 2017-08-09 MED ORDER — FENTANYL CITRATE (PF) 100 MCG/2ML IJ SOLN
INTRAMUSCULAR | Status: DC | PRN
  Administered 2017-08-09: 50 ug via INTRAVENOUS

## 2017-08-09 MED ORDER — MIDAZOLAM HCL 2 MG/2ML IJ SOLN
INTRAMUSCULAR | Status: DC | PRN
  Administered 2017-08-09: 1 mg via INTRAVENOUS

## 2017-08-09 MED ORDER — ZOLPIDEM TARTRATE 5 MG PO TABS
5.0000 mg | ORAL_TABLET | Freq: Every evening | ORAL | Status: DC | PRN
  Administered 2017-08-09: 5 mg via ORAL
  Filled 2017-08-09: qty 1

## 2017-08-09 MED ORDER — ATORVASTATIN CALCIUM 20 MG PO TABS
40.0000 mg | ORAL_TABLET | Freq: Every day | ORAL | Status: DC
  Administered 2017-08-10: 40 mg via ORAL
  Filled 2017-08-09: qty 2

## 2017-08-09 MED ORDER — CLOPIDOGREL BISULFATE 75 MG PO TABS
ORAL_TABLET | ORAL | Status: AC
  Filled 2017-08-09: qty 8

## 2017-08-09 MED ORDER — SODIUM CHLORIDE 0.9% FLUSH: 3.0000 mL | INTRAVENOUS | Status: DC | PRN

## 2017-08-09 MED ORDER — MIDAZOLAM HCL 2 MG/2ML IJ SOLN
INTRAMUSCULAR | Status: AC
  Filled 2017-08-09: qty 2

## 2017-08-09 MED ORDER — SODIUM CHLORIDE 0.9 % IV SOLN: INTRAVENOUS | Status: AC

## 2017-08-09 MED ORDER — HEPARIN (PORCINE) IN NACL 2-0.9 UNIT/ML-% IJ SOLN
INTRAMUSCULAR | Status: AC
  Filled 2017-08-09: qty 500

## 2017-08-09 MED ORDER — VERAPAMIL HCL 2.5 MG/ML IV SOLN
INTRAVENOUS | Status: DC | PRN
  Administered 2017-08-09: 2.5 mg via INTRA_ARTERIAL

## 2017-08-09 MED ORDER — ASPIRIN 81 MG PO CHEW
CHEWABLE_TABLET | ORAL | Status: DC | PRN
  Administered 2017-08-09: 243 mg via ORAL

## 2017-08-09 MED ORDER — NITROGLYCERIN 5 MG/ML IV SOLN
INTRAVENOUS | Status: AC
  Filled 2017-08-09: qty 10

## 2017-08-09 MED ORDER — SODIUM CHLORIDE 0.9 % IV SOLN: 250.0000 mL | INTRAVENOUS | Status: DC | PRN

## 2017-08-09 MED ORDER — HEPARIN SODIUM (PORCINE) 1000 UNIT/ML IJ SOLN
INTRAMUSCULAR | Status: DC | PRN
  Administered 2017-08-09: 4000 [IU] via INTRAVENOUS
  Administered 2017-08-09: 5000 [IU] via INTRAVENOUS

## 2017-08-09 MED ORDER — ASPIRIN 81 MG PO CHEW
CHEWABLE_TABLET | ORAL | Status: AC
  Filled 2017-08-09: qty 3

## 2017-08-09 MED ORDER — NITROGLYCERIN 1 MG/10 ML FOR IR/CATH LAB
INTRA_ARTERIAL | Status: DC | PRN
  Administered 2017-08-09: 200 ug via INTRACORONARY

## 2017-08-09 SURGICAL SUPPLY — 16 items
BALLN MINITREK RX 2.0X20 (BALLOONS) ×3
BALLN ~~LOC~~ TREK RX 2.5X20 (BALLOONS) ×3
BALLOON MINITREK RX 2.0X20 (BALLOONS) ×1 IMPLANT
BALLOON ~~LOC~~ TREK RX 2.5X20 (BALLOONS) ×1 IMPLANT
CATH 5F 110X4 TIG (CATHETERS) ×3 IMPLANT
CATH INFINITI 5FR JL4 (CATHETERS) ×3 IMPLANT
CATH VISTA GUIDE 6FR XB3 (CATHETERS) ×3 IMPLANT
CATH VISTA GUIDE 6FR XB3.5 (CATHETERS) ×3 IMPLANT
DEVICE INFLAT 30 PLUS (MISCELLANEOUS) ×3 IMPLANT
DEVICE RAD TR BAND REGULAR (VASCULAR PRODUCTS) ×3 IMPLANT
GLIDESHEATH SLEND SS 6F .021 (SHEATH) ×3 IMPLANT
KIT MANI 3VAL PERCEP (MISCELLANEOUS) ×3 IMPLANT
PACK CARDIAC CATH (CUSTOM PROCEDURE TRAY) ×3 IMPLANT
STENT RESOLUTE ONYX 2.25X38 (Permanent Stent) ×3 IMPLANT
WIRE ROSEN-J .035X260CM (WIRE) ×3 IMPLANT
WIRE RUNTHROUGH .014X180CM (WIRE) ×3 IMPLANT

## 2017-08-09 NOTE — Consult Note (Signed)
[place] for heparin drip Indication: chest pain/ACS  Allergies  Allergen Reactions  . Ace Inhibitors Other (See Comments)    Pt reports ace inhibitors make him cough    Patient Measurements: Height: 5' 7"  (170.2 cm) Weight: 201 lb 3.2 oz (91.3 kg) IBW/kg (Calculated) : 66.1 Heparin Dosing Weight: 85.1kg  Vital Signs: Temp: 98.6 F (37 C) (01/28 0502) Temp Source: Oral (01/28 0502) BP: 128/60 (01/28 0502) Pulse Rate: 63 (01/28 0502)  Labs: Recent Labs    08/06/17 1358 08/06/17 1417 08/06/17 1825 08/06/17 2345 08/07/17 0610  08/08/17 0000 08/08/17 0647 08/09/17 0433  HGB 14.7  --   --   --  14.7  --  14.3  --  14.7  HCT 42.3  --   --   --  42.1  --  39.8*  --  41.2  PLT 143*  --   --   --  128*  --  155  --  128*  APTT  --  35  --   --   --   --   --   --   --   LABPROT  --  13.3  --   --   --   --   --   --   --   INR  --  1.02  --   --   --   --   --   --   --   HEPARINUNFRC  --   --   --  0.22*  --    < > 0.37 0.38 0.25*  CREATININE 1.07  --   --   --  1.01  --   --   --   --   TROPONINI 0.15*  --  0.15* 0.12* 0.13*  --   --   --   --    < > = values in this interval not displayed.    Estimated Creatinine Clearance: 85.9 mL/min (by C-G formula based on SCr of 1.01 mg/dL).   Assessment: Patient is a 57 year old male who presents with SOB, chest discomfort. CT neg for PE but positive for mass/node. Pt has elevated troponin. Pharmacy consulted to dose heparin drip. Cardiology has been consulted. CBC relatively WNL. Baseline INR and APTT ordered as add on.  Goal of Therapy:  Heparin level 0.3-0.7 units/ml Monitor platelets by anticoagulation protocol: Yes   Plan:  Give 4000 units bolus x 1 Start heparin infusion at 1000 units/hr Check anti-Xa level in 6 hours and daily while on heparin Continue to monitor H&H and platelets   1/25 2345 HL 0.22. 1300 unit bolus and increase rate to 150 units/hr. Recheck in 6  hours.  1/26 0933 HL 0.34, within goal range. Will continue heparin drip at current rate and recheck in 6h to confirm. CBC in AM.  1/26 1630 HL subtherapeutic x 1. 1300 units IV x 1 bolus and increase rate to 1300 units/hr. Will recheck HL in 6 hours.  1/27 0000 heparin level 0.37. Continue current regimen. Recheck in 6 hours to confirm.  1/27 0650 heparin level 0.38, within goal range. Continue current drip rate. Recheck in AM. Hgb stable, plt count improved. CBC in AM  1/28 AM heparin level 0.25. 1300 unit bolus and increase rate to 1450 units/hr. Recheck heparin level in 6 hours.  Sim Boast, PharmD, BCPS  08/09/17 5:50 AM

## 2017-08-09 NOTE — Progress Notes (Signed)
Report given to [facility], [employer].

## 2017-08-09 NOTE — Progress Notes (Signed)
Progress Note  Patient Name: Larry Terrell Date of Encounter: 08/09/2017  Primary Cardiologist: [facility]  Subjective   Mild chest tightness this AM, "but nothing that [he] would usually worry about."  No dyspnea.  Scheduled for cath this AM.  Inpatient Medications    Scheduled Meds: . aspirin  81 mg Oral Daily  . atorvastatin  20 mg Oral Daily  . citalopram  40 mg Oral Daily  . furosemide  20 mg Oral Daily  . gabapentin  900 mg Oral TID  . insulin aspart  0-5 Units Subcutaneous QHS  . insulin aspart  0-9 Units Subcutaneous TID WC  . insulin aspart protamine- aspart  10 Units Subcutaneous BID WC  . metoprolol tartrate  50 mg Oral BID  . nitroGLYCERIN  0.5 inch Topical Q6H  . prednisoLONE acetate  1 drop Left Eye QID  . sodium chloride flush  3 mL Intravenous Q12H  . venlafaxine XR  150 mg Oral Q breakfast   Continuous Infusions: . sodium chloride    . sodium chloride 1 mL/kg/hr (08/09/17 0715)  . heparin 1,450 Units/hr (08/09/17 0618)   PRN Meds: sodium chloride, acetaminophen **OR** acetaminophen, hydrochlorothiazide, nitroGLYCERIN, ondansetron **OR** ondansetron (ZOFRAN) IV, oxyCODONE-acetaminophen, polyethylene glycol, sodium chloride flush, traMADol   Vital Signs    Vitals:   08/08/17 0745 08/08/17 1811 08/08/17 1957 08/09/17 0502  BP: 139/65 (!) 145/58 115/68 128/60  Pulse: 62 80 72 63  Resp: 18 18 18 18   Temp:   97.7 F (36.5 C) 98.6 F (37 C)  TempSrc:   Oral Oral  SpO2: 94% 99% 100% 99%  Weight:    201 lb 3.2 oz (91.3 kg)  Height:        Intake/Output Summary (Last 24 hours) at 08/09/2017 0802 Last data filed at 08/09/2017 0520 Gross per 24 hour  Intake 1080 ml  Output 4100 ml  Net -3020 ml   Filed Weights   08/07/17 0354 08/08/17 0505 08/09/17 0502  Weight: 198 lb 9.6 oz (90.1 kg) 200 lb 6.4 oz (90.9 kg) 201 lb 3.2 oz (91.3 kg)    Physical Exam   GEN: Well nourished, well developed, in no acute distress.  HEENT: Grossly normal.    Neck: Supple, no JVD, carotid bruits, or masses. Cardiac: RRR, no murmurs, rubs, or gallops. No clubbing, cyanosis, edema.  Radials/DP/PT 2+ and equal bilaterally.  Respiratory:  Respirations regular and unlabored, clear to auscultation bilaterally. GI: Soft, nontender, nondistended, BS + x 4. MS: no deformity or atrophy. Skin: warm and dry, no rash. Neuro:  Strength and sensation are intact. Psych: AAOx3.  Normal affect.  Labs    Chemistry Recent Labs  Lab 08/06/17 1358 08/07/17 0610  NA 139 139  K 4.8 4.3  CL 103 105  CO2 30 25  GLUCOSE 199* 170*  BUN 15 16  CREATININE 1.07 1.01  CALCIUM 9.6 9.2  GFRNONAA >60 >60  GFRAA >60 >60  ANIONGAP 6 9     Hematology Recent Labs  Lab 08/07/17 0610 08/08/17 0000 08/09/17 0433  WBC 6.9 8.2 6.4  RBC 4.44 4.28* 4.38*  HGB 14.7 14.3 14.7  HCT 42.1 39.8* 41.2  MCV 94.9 93.0 94.1  MCH 33.2 33.3 33.4  MCHC 35.0 35.8 35.6  RDW 15.0* 15.3* 15.3*  PLT 128* 155 128*    Cardiac Enzymes Recent Labs  Lab 08/06/17 1358 08/06/17 1825 08/06/17 2345 08/07/17 0610  TROPONINI 0.15* 0.15* 0.12* 0.13*     BNP Recent Labs  Lab  08/06/17 1358  BNP 107.0*     Radiology    No results found.  Telemetry    RSR/sinus brady - Personally Reviewed  Cardiac Studies   2D Echocardiogram 1.26.2018  Study Conclusions  - Left ventricle: The cavity size was normal. There was moderate   concentric hypertrophy. Systolic function was normal. The   estimated ejection fraction was in the range of 55% to 60%. Wall   motion was normal; there were no regional wall motion   abnormalities. Features are consistent with a pseudonormal left   ventricular filling pattern, with concomitant abnormal relaxation   and increased filling pressure (grade 2 diastolic dysfunction). - Mitral valve: There was mild regurgitation. - Left atrium: The atrium was moderately dilated. - Right ventricle: Systolic function was normal.  Patient Profile      57 y.o. male with a hx of known chronic chronic diastolic congestive heart failure, COPD and obstructive sleep apnea, diabetes mellitus, coronary artery disease, admitted 1/15 with c/p; dyspnea, and NSTEMI.  Assessment & Plan    1. NSTEMI/CAD: pt w/ prior h/o CAD s/p remote RCA stenting x 2 and LCX stenting with subsequent NSTEMI in 12/2013 in the setting of mid RCA ISR req PTCA (no stent) and then PCI/Xience DES to the LCX  OM1 in 02/2016.  Of note** interventional team unable to gain access via R radial and unable to advance wire up R femoral in 02/2016. L radial used for case @ that time.  He presented to [facility] with dyspnea and chest tightness and had mild trop elev over the weekend.  CTA chest neg for PE.  Echo w/ nl EF, no rwma.  He continues to have mild chest tightness.  Scheduled for cath this am.  The patient understands that risks include but are not limited to stroke (1 in 1000), death (1 in 1000), kidney failure [usually temporary] (1 in 500), bleeding (1 in 200), allergic reaction [possibly serious] (1 in 200), and agrees to proceed. Cont asa, statin,  blocker, heparin, ntp.  2.  Chronic diastolic CHF:  Echo w/ nl EF, gr2 DD.  Euvolemic.  HR/BP stable. Cont  blocker.  Could consider adding spiro (reduced hosp in [facility]).  3.  Essential HTN:  Stable.  Follow.  4. HL:  TC 70, LDL 2 - ? Accuracy. Cont statin.  5. DMII:  Sliding scale insulin per IM.  a1c 5.5.  Signed, Murray Hodgkins, NP  08/09/2017, 8:02 AM    For questions or updates, please contact   Please consult [email or web address] for contact info under Cardiology/STEMI.

## 2017-08-09 NOTE — Progress Notes (Signed)
[facility] [place] NAME: Larry Terrell    MR#:  [id number]  DATE OF BIRTH:  September 23, 1960  SUBJECTIVE:  Came in with shortness of breath and some chest discomfort.  No new complaints today REVIEW OF SYSTEMS:   Review of Systems  Constitutional: Negative for chills, fever and weight loss.  HENT: Negative for ear discharge, ear pain and nosebleeds.   Eyes: Negative for blurred vision, pain and discharge.  Respiratory: Negative for sputum production, shortness of breath, wheezing and stridor.   Cardiovascular: Negative for chest pain, palpitations, orthopnea and PND.  Gastrointestinal: Negative for abdominal pain, diarrhea, nausea and vomiting.  Genitourinary: Negative for frequency and urgency.  Musculoskeletal: Negative for back pain and joint pain.  Neurological: Positive for weakness. Negative for sensory change, speech change and focal weakness.  Psychiatric/Behavioral: Negative for depression and hallucinations. The patient is not nervous/anxious.    Tolerating Diet:yes Tolerating PT: not needed  DRUG ALLERGIES:   Allergies  Allergen Reactions  . Ace Inhibitors Other (See Comments)    Pt reports ace inhibitors make him cough    VITALS:  Blood pressure 116/67, pulse (!) 59, temperature 97.6 F (36.4 C), temperature source Oral, resp. rate 20, height 5' 7"  (1.702 m), weight 91.2 kg (201 lb), SpO2 96 %.  PHYSICAL EXAMINATION:   Physical Exam  GENERAL:  57 y.o.-year-old patient lying in the bed with no acute distress.  EYES: Pupils equal, round, reactive to [place] and accommodation. No scleral icterus. Extraocular muscles intact.  HEENT: Head atraumatic, normocephalic. Oropharynx and nasopharynx clear.  NECK:  Supple, no jugular venous distention. No thyroid enlargement, no tenderness.  LUNGS: Normal breath sounds bilaterally, no wheezing, rales, rhonchi. No use of accessory muscles of respiration.  CARDIOVASCULAR: S1, S2  normal. No murmurs, rubs, or gallops.  ABDOMEN: Soft, nontender, nondistended. Bowel sounds present. No organomegaly or mass.  EXTREMITIES: No cyanosis, clubbing or edema b/l.    NEUROLOGIC: Cranial nerves II through XII are intact. No focal Motor or sensory deficits b/l.   PSYCHIATRIC:  patient is alert and oriented x 3.  SKIN: No obvious rash, lesion, or ulcer.   LABORATORY PANEL:  CBC Recent Labs  Lab 08/09/17 0433  WBC 6.4  HGB 14.7  HCT 41.2  PLT 128*    Chemistries  Recent Labs  Lab 08/07/17 0610  NA 139  K 4.3  CL 105  CO2 25  GLUCOSE 170*  BUN 16  CREATININE 1.01  CALCIUM 9.2   Cardiac Enzymes Recent Labs  Lab 08/07/17 0610  TROPONINI 0.13*   RADIOLOGY:  No results found. ASSESSMENT AND PLAN:   Larry Terrell  is a 57 y.o. male with a known history of chronic diastolic heart failure with preserved ejection fraction, COPD, OSA not compliant with CPAP, diabetes and CAD who presents with shortness of breath. Patient reports over the past several days he has had on and off shortness of breath associated with some chest discomfort  1. Non-ST elevation NL:ZJQBHAL that shortness of breath and chest discomfort are cardiac related. CT chest did not show evidence of pulmonary by, pneumonia or CHF. Continue aspirin, statin and metoprolol Nitroglycerin patch ordered chmg cardiology consultation with Dr Kristopher Oppenheim noted--recommends cath today. Dr Fletcher Anon to perform -Patient has history of CAD status post stent placement in proximal LAD in August 2017 at [facility]  2. Diabetes: Continue NPH with sliding scale and ADA diet  3. Hyperlipidemia: Continue Lipitor  4. OSA: Patient is not compliant  with CPAP and does not want to wear CPAP while in the hospital  5. COPD without signs of exacerbation  6. Depression: Continue Effexor  7. Chronic diastolic heart failure: Patient does not appear to be in exacerbation at this time  Discussed with patient and wife Case discussed  with Care Management/Social Worker. Management plans discussed with the patient, family and they are in agreement.  CODE STATUS: full  DVT Prophylaxis: lovenox  TOTAL TIME TAKING CARE OF THIS PATIENT: *30* minutes.  >50% time spent on counselling and coordination of care  POSSIBLE D/C IN *1-2 DAYS, DEPENDING ON CLINICAL CONDITION.  Note: This dictation was prepared with Dragon dictation along with smaller phrase technology. Any transcriptional errors that result from this process are unintentional.  Fritzi Mandes M.D on 08/09/2017 at 3:00 PM  Between 7am to 6pm - Pager - [phone]  After 6pm go to [email or web address] - password [email or web address]  [facility]  Office  [phone]  CC: Primary care physician; Patient, No Pcp PerPatient ID: Larry Terrell, male   DOB: 18-Jun-1961, 57 y.o.   MRN: [id number]

## 2017-08-09 NOTE — Progress Notes (Signed)
Notify Dr. Posey Pronto about patient had stent placement/ intervention at cardiac cath. Patient will stay overnight.

## 2017-08-09 NOTE — Consult Note (Signed)
[place] for heparin drip Indication: chest pain/ACS  Allergies  Allergen Reactions  . Ace Inhibitors Other (See Comments)    Pt reports ace inhibitors make him cough    Patient Measurements: Height: 5' 7"  (170.2 cm) Weight: 201 lb 3.2 oz (91.3 kg) IBW/kg (Calculated) : 66.1 Heparin Dosing Weight: 85.1kg  Vital Signs: Temp: 97.5 F (36.4 C) (01/28 0826) Temp Source: Oral (01/28 0826) BP: 156/72 (01/28 0826) Pulse Rate: 62 (01/28 0826)  Labs: Recent Labs    08/06/17 1358 08/06/17 1417 08/06/17 1825 08/06/17 2345 08/07/17 0610  08/08/17 0000 08/08/17 0647 08/09/17 0433 08/09/17 1213  HGB 14.7  --   --   --  14.7  --  14.3  --  14.7  --   HCT 42.3  --   --   --  42.1  --  39.8*  --  41.2  --   PLT 143*  --   --   --  128*  --  155  --  128*  --   APTT  --  35  --   --   --   --   --   --   --   --   LABPROT  --  13.3  --   --   --   --   --   --   --   --   INR  --  1.02  --   --   --   --   --   --   --   --   HEPARINUNFRC  --   --   --  0.22*  --    < > 0.37 0.38 0.25* 0.44  CREATININE 1.07  --   --   --  1.01  --   --   --   --   --   TROPONINI 0.15*  --  0.15* 0.12* 0.13*  --   --   --   --   --    < > = values in this interval not displayed.    Estimated Creatinine Clearance: 85.9 mL/min (by C-G formula based on SCr of 1.01 mg/dL).   Assessment: Patient is a 57 year old male who presents with SOB, chest discomfort. CT neg for PE but positive for mass/node. Pt has elevated troponin. Pharmacy consulted to dose heparin drip. Cardiology has been consulted. CBC relatively WNL. Baseline INR and APTT ordered as add on.  Goal of Therapy:  Heparin level 0.3-0.7 units/ml Monitor platelets by anticoagulation protocol: Yes   Plan:  Give 4000 units bolus x 1 Start heparin infusion at 1000 units/hr Check anti-Xa level in 6 hours and daily while on heparin Continue to monitor H&H and platelets   1/25 2345 HL 0.22. 1300 unit  bolus and increase rate to 150 units/hr. Recheck in 6 hours.  1/26 0933 HL 0.34, within goal range. Will continue heparin drip at current rate and recheck in 6h to confirm. CBC in AM.  1/26 1630 HL subtherapeutic x 1. 1300 units IV x 1 bolus and increase rate to 1300 units/hr. Will recheck HL in 6 hours.  1/27 0000 heparin level 0.37. Continue current regimen. Recheck in 6 hours to confirm.  1/27 0650 heparin level 0.38, within goal range. Continue current drip rate. Recheck in AM. Hgb stable, plt count improved. CBC in AM  1/28 AM heparin level 0.25. 1300 unit bolus and increase rate to 1450 units/hr. Recheck heparin level in 6  hours.  1/28 1215 HL 0.44, therapeutic. Continue at current rate. Recheck HL in 6h and CBC in AM. Follow up after cath.  Rayna Sexton, PharmD, BCPS Clinical Pharmacist 08/09/2017 12:50 PM

## 2017-08-10 ENCOUNTER — Telehealth: Payer: Self-pay | Admitting: Cardiovascular Disease

## 2017-08-10 ENCOUNTER — Encounter: Payer: Self-pay | Admitting: Cardiovascular Disease

## 2017-08-10 LAB — GLUCOSE, CAPILLARY
Glucose-Capillary: 108 mg/dL — ABNORMAL HIGH (ref 65–99)
Glucose-Capillary: 93 mg/dL (ref 65–99)

## 2017-08-10 LAB — CBC
HCT: 39.5 % — ABNORMAL LOW (ref 40.0–52.0)
HEMOGLOBIN: 13.8 g/dL (ref 13.0–18.0)
MCH: 33.5 pg (ref 26.0–34.0)
MCHC: 34.9 g/dL (ref 32.0–36.0)
MCV: 96 fL (ref 80.0–100.0)
Platelets: 123 10*3/uL — ABNORMAL LOW (ref 150–440)
RBC: 4.11 MIL/uL — AB (ref 4.40–5.90)
RDW: 15.5 % — ABNORMAL HIGH (ref 11.5–14.5)
WBC: 6.8 10*3/uL (ref 3.8–10.6)

## 2017-08-10 LAB — BASIC METABOLIC PANEL WITH GFR
Anion gap: 8 (ref 5–15)
BUN: 21 mg/dL — ABNORMAL HIGH (ref 6–20)
CO2: 23 mmol/L (ref 22–32)
Calcium: 8.5 mg/dL — ABNORMAL LOW (ref 8.9–10.3)
Chloride: 107 mmol/L (ref 101–111)
Creatinine, Ser: 1.11 mg/dL (ref 0.61–1.24)
GFR calc Af Amer: >60 mL/min
GFR calc non Af Amer: >60 mL/min
Glucose, Bld: 98 mg/dL (ref 65–99)
Potassium: 4.2 mmol/L (ref 3.5–5.1)
Sodium: 138 mmol/L (ref 135–145)

## 2017-08-10 MED ORDER — ASPIRIN 81 MG PO CHEW: 81.0000 mg | CHEWABLE_TABLET | Freq: Every day | ORAL | 1 refills | Status: DC

## 2017-08-10 MED ORDER — SODIUM CHLORIDE 0.9 % IV SOLN
INTRAVENOUS | Status: DC
  Administered 2017-08-10: 06:00:00 via INTRAVENOUS

## 2017-08-10 MED ORDER — CLOPIDOGREL BISULFATE 75 MG PO TABS: 75.0000 mg | ORAL_TABLET | Freq: Every day | ORAL | 2 refills | Status: DC

## 2017-08-10 NOTE — Progress Notes (Signed)
Discharged to home with a friend.  Will stop and pick up meds on the way home.  Education regard cath site care.  He had many questions about what he can and cant do.

## 2017-08-10 NOTE — Progress Notes (Addendum)
Larry Terrell a31 y.o.malewith a known history of chronic diastolic heart failure with preserved ejection fraction, COPD, OSA not compliant with CPAP, diabetes and CAD who presented to the ED with shortness of breath. Patient reported over the past several days he has had on and off shortness of breath associated with some chest discomfort. Patient admitted with dx of NSTEMI.  Note:  CT chest did not show evidence of pulmonary by, pneumonia or CHF.  Patent on aspirin, plavix,statin and metoprolol, prn nitro -pt is allergic to ACE -s/p cardiac cath by DR Arida 1. Significant underlying two-vessel coronary artery disease. Patent RCA stents with moderate mid in-stent restenosis. Severe subocclusive in-stent restenosis in the left circumflex. 2. Normal LV systolic function and left ventricular end-diastolic pressure. 3. Successful balloon angioplasty and drug-eluting stent placement to the left circumflex to cover from the distal to the proximal area with one long stent. -Patient has history of CAD status post stent placement in proximal LAD in August 2017 at [facility]  Patient has hx of DM (NPH Insulin with Sliding Scale, Metformin, and Diet), HLD (on Lipitor), OSA (non-complaint with CPAP), COPD, Depression (on Effexor), chronic diastolic HF - does not appear to be in exacerbation currently per Dr. Posey Pronto.    Patient sitting up on side of bed watching TV.    "Heart Attack Bouncing Back" booklet given and reviewed with patient.  Patient was unaware that he had actually had a heart attack.   Discussed the definition of CAD. Reviewed the location CAD and where stents were placed. Patient has stent card. Explained the purpose of the stent card. Instructed patient to keep stent card in her wallet.  ? Discussed modifiable risk factors including controlling blood pressure, cholesterol, and blood sugar; following heart healthy diet; maintaining healthy weight; exercise; and smoking cessation, if applicable.   Note: Patient is a former smoker.  ? Discussed cardiac medications including rationale for taking, mechanisms of action, and side effects. Stressed the importance of taking medications as prescribed.  ? Discussed emergency plan for heart attack symptoms. Patient verbalized understanding of need to call 911 and not to drive herself to ER if having cardiac symptoms / chest pain.  ? Heart healthy diet of low sodium, low fat, low cholesterol heart healthy diet/ carb modified discussed. Information on diet provided. ? Exercise - Benefits of exercise discussed.  Patient reported to this RN that he has been working out at [employer] and has lost 20 pounds.  Explained to patient he  had been referred to Cardiac Rehab. Overview of the program provided. Patient informed this RN that he participated in Cardiac Rehab approximately 10 years ago. Patient has Medicare A, B, and D.  Explained to patient that Medicare B covers 80%.  Encouraged patient to check with Medicare to see what if any out of pocket expenses he would have with Cardiac Rehab. Patient scheduled to for Cardiac Rehab orientation on August 19, 2017 at 11:30.  Orientation packet provided to patient with instructions to complete prior to orientation appointment.    Psychosocial - Patient shared with this RN that his wife left him in October 2018 and he has had a hard time dealing with this.  Patient is on Effexor and reports seeing a counselor on a regular basis.  He also reports having a girlfriend.    Smoking Cessation - Patient is a former smoker.    ? Roanna Epley, RN, BSN, [facility] Cardiovascular and Pulmonary Nurse Navigator ?

## 2017-08-10 NOTE — Progress Notes (Signed)
Progress Note  Patient Name: Larry Terrell Date of Encounter: 08/10/2017  Primary Cardiologist: [facility]  Subjective   Status post [facility] 1/28 with PCI/DES to the LCx as detailed below. No further SOB. Never with chest pain. Has ambulated in the room without issues. Post cath labs stable. Tolerating ASA/Plavix. Echo as below.   Inpatient Medications    Scheduled Meds: . aspirin  81 mg Oral Daily  . atorvastatin  40 mg Oral Daily  . citalopram  40 mg Oral Daily  . clopidogrel  75 mg Oral Q breakfast  . enoxaparin (LOVENOX) injection  40 mg Subcutaneous Q24H  . furosemide  20 mg Oral Daily  . gabapentin  900 mg Oral TID  . insulin aspart  0-5 Units Subcutaneous QHS  . insulin aspart  0-9 Units Subcutaneous TID WC  . insulin aspart protamine- aspart  10 Units Subcutaneous BID WC  . metoprolol tartrate  50 mg Oral BID  . nitroGLYCERIN  0.5 inch Topical Q6H  . prednisoLONE acetate  1 drop Left Eye QID  . sodium chloride flush  3 mL Intravenous Q12H  . venlafaxine XR  150 mg Oral Q breakfast   Continuous Infusions: . sodium chloride    . sodium chloride 75 mL/hr at 08/10/17 0538   PRN Meds: sodium chloride, acetaminophen **OR** acetaminophen, hydrochlorothiazide, nitroGLYCERIN, ondansetron **OR** ondansetron (ZOFRAN) IV, oxyCODONE-acetaminophen, polyethylene glycol, sodium chloride flush, traMADol, zolpidem   Vital Signs    Vitals:   08/09/17 1954 08/09/17 2300 08/10/17 0425 08/10/17 0800  BP: (!) 143/47 (!) 142/52 (!) 141/78 (!) 143/77  Pulse: 74  65 75  Resp: _0 Temp: 97.7 F (36.5 C)  97.9 F (36.6 C) 97.6 F (36.4 C)  TempSrc: Oral  Oral Oral  SpO2: 98%  96% 100%  Weight:   205 lb 9.6 oz (93.3 kg)   Height:        Intake/Output Summary (Last 24 hours) at 08/10/2017 0957 Last data filed at 08/10/2017 0800 Gross per 24 hour  Intake 27.5 ml  Output 2375 ml  Net -2347.5 ml   Filed Weights   08/09/17 0502 08/09/17 1344 08/10/17 0425  Weight: 201 lb 3.2 oz  (91.3 kg) 201 lb (91.2 kg) 205 lb 9.6 oz (93.3 kg)    Telemetry    NSR - Personally Reviewed  ECG    n/a - Personally Reviewed  Physical Exam   GEN: No acute distress.   Neck: No JVD. Cardiac: RRR, no murmurs, rubs, or gallops. Radial cardiac cath site without bleeding, bruising, swelling, erythema, or TTP. Radial pulse 2+.  Respiratory: Clear to auscultation bilaterally.  GI: Soft, nontender, non-distended.   MS: No edema; No deformity. Neuro:  Alert and oriented x 3; Nonfocal.  Psych: Normal affect.  Labs    Chemistry Recent Labs  Lab 08/06/17 1358 08/07/17 0610 08/09/17 0433 08/10/17 0435  NA 139 139  --  138  K 4.8 4.3  --  4.2  CL 103 105  --  107  CO2 30 25  --  23  GLUCOSE 199* 170*  --  98  BUN 15 16  --  21*  CREATININE 1.07 1.01 1.16 1.11  CALCIUM 9.6 9.2  --  8.5*  GFRNONAA >60 >60 >60 >60  GFRAA >60 >60 >60 >60  ANIONGAP 6 9  --  8     Hematology Recent Labs  Lab 08/08/17 0000 08/09/17 0433 08/10/17 0435  WBC 8.2 6.4 6.8  RBC 4.28*  4.38* 4.11*  HGB 14.3 14.7 13.8  HCT 39.8* 41.2 39.5*  MCV 93.0 94.1 96.0  MCH 33.3 33.4 33.5  MCHC 35.8 35.6 34.9  RDW 15.3* 15.3* 15.5*  PLT 155 128* 123*    Cardiac Enzymes Recent Labs  Lab 08/06/17 1358 08/06/17 1825 08/06/17 2345 08/07/17 0610  TROPONINI 0.15* 0.15* 0.12* 0.13*   No results for input(s): TROPIPOC in the last 168 hours.   BNP Recent Labs  Lab 08/06/17 1358  BNP 107.0*     DDimer No results for input(s): DDIMER in the last 168 hours.   Radiology    No results found.  Cardiac Studies   LHC 08/09/2017: Coronary Findings   Diagnostic  Dominance: Right  Left Main  Vessel is angiographically normal.  Left Anterior Descending  The vessel exhibits minimal luminal irregularities.  First Diagonal Branch  The vessel exhibits minimal luminal irregularities.  First Septal Branch  Ramus Intermedius  There is mild diffuse disease throughout the vessel.  Left Circumflex    Prox Cx lesion 70% stenosed  Prox Cx lesion is 70% stenosed. The lesion was previously treated.  Mid Cx to Dist Cx lesion 99% stenosed  Mid Cx to Dist Cx lesion is 99% stenosed. The lesion was previously treated.  First Obtuse Marginal Branch  Vessel is small in size.  Right Coronary Artery  Mid RCA lesion 40% stenosed  Mid RCA lesion is 40% stenosed. The lesion was previously treated.  Mid RCA to Dist RCA lesion 10% stenosed  Mid RCA to Dist RCA lesion is 10% stenosed. The lesion was previously treated.  Inferior Septal  There is mild disease in the vessel.  Right Posterior Atrioventricular Branch  Vessel is small in size.  First Right Posterolateral  The vessel exhibits minimal luminal irregularities.  Second Right Posterolateral  The vessel exhibits minimal luminal irregularities.  Intervention   Prox Cx lesion  Stent (Also treats lesions: Mid Cx to Dist Cx)  Lesion length: 36 mm. Lesion crossed with guidewire using a WIRE RUNTHROUGH .[id number]. Pre-stent angioplasty was performed using a BALLOON MINITREK RX 2.0X20. A drug-eluting stent was successfully placed using a STENT RESOLUTE ONYX [id number]. Maximum pressure: 10 atm. Inflation time: 20 sec. Post-stent angioplasty was performed using a BALLOON Gordon TREK RX 2.5X20. Maximum pressure: 14 atm. Inflation time: 20 sec.  Post-Intervention Lesion Assessment  There is no residual stenosis post intervention.  Mid Cx to Dist Cx lesion  Stent (Also treats lesions: Prox Cx)  Lesion length: 36 mm. Lesion crossed with guidewire using a WIRE RUNTHROUGH .[id number]. Pre-stent angioplasty was performed using a BALLOON MINITREK RX 2.0X20. A drug-eluting stent was successfully placed using a STENT RESOLUTE ONYX [id number]. Maximum pressure: 10 atm. Inflation time: 20 sec. Post-stent angioplasty was performed using a BALLOON [place] TREK RX 2.5X20. Maximum pressure: 14 atm. Inflation time: 20 sec.  Post-Intervention Lesion Assessment  The intervention was  successful. Pre-interventional TIMI flow is 3. Post-intervention TIMI flow is 3. Treated lesion length: 36 mm. No complications occurred at this lesion.  There is no residual stenosis post intervention.  Wall Motion              Left Heart   Left Ventricle The left ventricular size is normal. The left ventricular systolic function is normal. LV end diastolic pressure is normal. The left ventricular ejection fraction is 55-65% by visual estimate. No regional wall motion abnormalities.  Coronary Diagrams   Diagnostic Diagram       Post-Intervention Diagram  Conclusion     The left ventricular systolic function is normal.  LV end diastolic pressure is normal.  The left ventricular ejection fraction is 55-65% by visual estimate.  Mid RCA lesion is 40% stenosed.  Mid RCA to Dist RCA lesion is 10% stenosed.  Mid Cx to Dist Cx lesion is 99% stenosed.  Prox Cx lesion is 70% stenosed.  A drug-eluting stent was successfully placed using a STENT RESOLUTE ONYX [id number].  Post intervention, there is a 0% residual stenosis.  Post intervention, there is a 0% residual stenosis.   1. Significant underlying two-vessel coronary artery disease.  Patent RCA stents with moderate mid in-stent restenosis.  Severe subocclusive in-stent restenosis in the left circumflex. 2.  Normal LV systolic function and left ventricular end-diastolic pressure. 3.  Successful balloon angioplasty and drug-eluting stent placement to the left circumflex to cover from the distal to the proximal area with one long stent.  Recommendations: Dual antiplatelet therapy indefinitely if tolerated given multiple stents.  Aggressive treatment of risk factors. Discharge home tomorrow if no complications.     TTE 08/07/2017: Study Conclusions  - Left ventricle: The cavity size was normal. There was moderate   concentric hypertrophy. Systolic function was normal. The   estimated ejection fraction was in  the range of 55% to 60%. Wall   motion was normal; there were no regional wall motion   abnormalities. Features are consistent with a pseudonormal left   ventricular filling pattern, with concomitant abnormal relaxation   and increased filling pressure (grade 2 diastolic dysfunction). - Mitral valve: There was mild regurgitation. - Left atrium: The atrium was moderately dilated. - Right ventricle: Systolic function was normal.  Patient Profile     57 y.o. male with history of chronic chronic diastolic congestive heart failure, COPD and obstructive sleep apnea, diabetes mellitus, coronary artery disease, admitted 1/25 with c/p; dyspnea, and NSTEMI.  Assessment & Plan    1. NSTEMI/CAD: pt w/ prior h/o CAD s/p remote RCA stenting x 2 and LCX stenting with subsequent NSTEMI in 12/2013 in the setting of mid RCA ISR req PTCA (no stent) and then PCI/Xience DES to the LCX  OM1 in 02/2016.  Of note interventional team unable to gain access via R radial and unable to advance wire up R femoral in 02/2016. L radial used for case @ that time.  He presented to [facility] with dyspnea and chest tightness and had mild trop elev over the weekend with a peak of 0.15.  CTA chest neg for PE.  Echo w/ nl EF, no rwma.  He continued to have mild chest tightness on rounds on 1/28.  Underwent LHC 1/28 that showed severe two-vessel CAD as detailed above. Status post PCI/DES to the LCx as above. Continue DAPT with ASA and Plavix for at least the next 12 months, possible indefinitely.  Post-cath instructions discussed. Cardiac rehab.   2.  Chronic diastolic CHF:  Echo w/ nl EF, gr2 DD.  Euvolemic.  HR/BP stable. Cont ? blocker.  Could consider adding spiro as outpatient (reduced hosp in [place]).  3.  Essential HTN:  Stable.  Follow.  4. HL:  TC 70, LDL 2 - ? Accuracy. Recommend recheck as outpatient.  Cont statin.  5. DMII:  Sliding scale insulin per IM.  a1c 5.5.    For questions or updates, please contact Anton Please consult [email or web address] for contact info under Cardiology/STEMI.    Signed, Christell Faith, PA-C [place] Pager: [phone] 08/10/2017, 9:57 AM

## 2017-08-10 NOTE — Plan of Care (Signed)
  Progressing Education: Knowledge of General Education information will improve 08/10/2017 0240 - Progressing by Marylouise Stacks, RN Health Behavior/Discharge Planning: Ability to manage health-related needs will improve 08/10/2017 0240 - Progressing by Marylouise Stacks, RN Clinical Measurements: Ability to maintain clinical measurements within normal limits will improve 08/10/2017 0240 - Progressing by Marylouise Stacks, RN Will remain free from infection 08/10/2017 0240 - Progressing by Marylouise Stacks, RN Diagnostic test results will improve 08/10/2017 0240 - Progressing by Marylouise Stacks, RN Respiratory complications will improve 08/10/2017 0240 - Progressing by Marylouise Stacks, RN Cardiovascular complication will be avoided 08/10/2017 0240 - Progressing by Marylouise Stacks, RN Activity: Risk for activity intolerance will decrease 08/10/2017 0240 - Progressing by Marylouise Stacks, RN Nutrition: Adequate nutrition will be maintained 08/10/2017 0240 - Progressing by Marylouise Stacks, RN Coping: Level of anxiety will decrease 08/10/2017 0240 - Progressing by Marylouise Stacks, RN Elimination: Will not experience complications related to bowel motility 08/10/2017 0240 - Progressing by Marylouise Stacks, RN Will not experience complications related to urinary retention 08/10/2017 0240 - Progressing by Marylouise Stacks, RN Pain Managment: General experience of comfort will improve 08/10/2017 0240 - Progressing by Marylouise Stacks, RN Safety: Ability to remain free from injury will improve 08/10/2017 0240 - Progressing by Marylouise Stacks, RN Skin Integrity: Risk for impaired skin integrity will decrease 08/10/2017 0240 - Progressing by Marylouise Stacks, RN Education: Understanding of CV disease, CV risk reduction, and recovery process will improve 08/10/2017 0240 - Progressing by Marylouise Stacks, RN Activity: Ability to return to baseline  activity level will improve 08/10/2017 0240 - Progressing by Marylouise Stacks, RN Cardiovascular: Ability to achieve and maintain adequate cardiovascular perfusion will improve 08/10/2017 0240 - Progressing by Marylouise Stacks, RN Vascular access site(s) Level 0-1 will be maintained 08/10/2017 0240 - Progressing by Marylouise Stacks, RN Health Behavior/Discharge Planning: Ability to safely manage health-related needs after discharge will improve 08/10/2017 0240 - Progressing by Marylouise Stacks, RN

## 2017-08-10 NOTE — Progress Notes (Signed)
I have reviewed the student's assessment and agree with her findings.

## 2017-08-10 NOTE — Telephone Encounter (Signed)
Patient currently admitted at this time.

## 2017-08-10 NOTE — Discharge Summary (Signed)
[place] at [place] NAME: Larry Terrell    MR#:  [id number]  DATE OF BIRTH:  January 03, 1961  DATE OF ADMISSION:  08/06/2017 ADMITTING PHYSICIAN: Bettey Costa, MD  DATE OF DISCHARGE: 08/10/2017  PRIMARY CARE PHYSICIAN: Patient, No Pcp Per    ADMISSION DIAGNOSIS:  Shortness of breath [R06.02] Unstable angina ([place]) [I20.0] Elevated troponin [R74.8]  DISCHARGE DIAGNOSIS:  NSTEMI   SECONDARY DIAGNOSIS:   Past Medical History:  Diagnosis Date  . CHF (congestive heart failure) (HCC)    diastolic (echo at [facility] 8016) EF 55-60%  . Cirrhosis ([place])    NASH per records  . COPD (chronic obstructive pulmonary disease) ([place])   . Depression   . Diabetes mellitus with neuropathy ([place])   . Heart attack ([place])   . Neuropathy   . OSA (obstructive sleep apnea)   . Peripheral vascular disease due to secondary diabetes mellitus ([place])   . Splenomegaly   . Temporal giant cell arteritis ([place])   . TIA (transient ischemic attack) April 2016    HOSPITAL COURSE:   Larry Terrell a85 y.o.malewith a known history of chronic diastolic heart failure with preserved ejection fraction, COPD, OSA not compliant with CPAP, diabetes and CAD who presents with shortness of breath. Patient reports over the past several days he has had on and off shortness of breath associated with some chest discomfort  1. Non-ST elevation PV:VZSMOLM that shortness of breath and chest discomfort are cardiac related. -CT chest did not show evidence of pulmonary by, pneumonia or CHF. -Continue aspirin, plavix,statin and metoprolol, prn nitro -pt is allergic to ACE -s/p cardiac cath by DR Arida 1. Significant underlying two-vessel coronary artery disease.  Patent RCA stents with moderate mid in-stent restenosis.  Severe subocclusive in-stent restenosis in the left circumflex. 2.  Normal LV systolic function and left ventricular end-diastolic pressure. 3.  Successful balloon angioplasty  and drug-eluting stent placement to the left circumflex to cover from the distal to the proximal area with one long stent. -Patient has history of CAD status post stent placement in proximal LAD in August 2017 at [facility]  2. Diabetes: Continue NPH with sliding scale and ADA diet Resume metformin  3. Hyperlipidemia: Continue Lipitor  4. OSA: Patient is not compliant with CPAP and does not want to wear CPAP while in the hospital  5. COPD without signs of exacerbation  6. Depression: Continue Effexor  7. Chronic diastolic heart failure: Patient does not appear to be in exacerbation at this time  Overall stable for d/c home   CONSULTS OBTAINED:  Treatment Team:  Larey Dresser, MD [place] Hampshire, MD Nahser, Wonda Cheng, MD  DRUG ALLERGIES:   Allergies  Allergen Reactions  . Ace Inhibitors Other (See Comments)    Pt reports ace inhibitors make him cough    DISCHARGE MEDICATIONS:   Allergies as of 08/10/2017      Reactions   Ace Inhibitors Other (See Comments)   Pt reports ace inhibitors make him cough      Medication List    TAKE these medications   aspirin 81 MG chewable tablet Chew 1 tablet (81 mg total) by mouth daily. Start taking on:  08/11/2017   atorvastatin 40 MG tablet Commonly known as:  LIPITOR Take 40 mg by mouth daily.   clopidogrel 75 MG tablet Commonly known as:  PLAVIX Take 1 tablet (75 mg total) by mouth daily with breakfast. Start taking on:  08/11/2017   insulin NPH-regular Human (  70-30) 100 UNIT/ML injection Commonly known as:  NOVOLIN 70/30 Inject 20-24 Units into the skin 2 (two) times daily. Take 24units in the morning and 20units in the evening.   metFORMIN 500 MG tablet Commonly known as:  GLUCOPHAGE Take 500 mg by mouth 2 (two) times daily with a meal.   metoprolol tartrate 50 MG tablet Commonly known as:  LOPRESSOR Take 50 mg by mouth 2 (two) times daily.   nitroGLYCERIN 0.4 MG SL tablet Commonly known as:  NITROSTAT Place  0.4 mg under the tongue every 5 (five) minutes as needed for chest pain.   oxyCODONE-acetaminophen 5-325 MG tablet Commonly known as:  ROXICET Take 1 tablet by mouth every 6 (six) hours as needed for moderate pain or severe pain.   traMADol 50 MG tablet Commonly known as:  ULTRAM Take 1 tablet (50 mg total) by mouth every 6 (six) hours as needed. What changed:  reasons to take this   venlafaxine XR 150 MG 24 hr capsule Commonly known as:  EFFEXOR-XR Take 150 mg by mouth daily with breakfast.       If you experience worsening of your admission symptoms, develop shortness of breath, life threatening emergency, suicidal or homicidal thoughts you must seek medical attention immediately by calling 911 or calling your MD immediately  if symptoms less severe.  You Must read complete instructions/literature along with all the possible adverse reactions/side effects for all the Medicines you take and that have been prescribed to you. Take any new Medicines after you have completely understood and accept all the possible adverse reactions/side effects.   Please note  You were cared for by a hospitalist during your hospital stay. If you have any questions about your discharge medications or the care you received while you were in the hospital after you are discharged, you can call the unit and asked to speak with the hospitalist on call if the hospitalist that took care of you is not available. Once you are discharged, your primary care physician will handle any further medical issues. Please note that NO REFILLS for any discharge medications will be authorized once you are discharged, as it is imperative that you return to your primary care physician (or establish a relationship with a primary care physician if you do not have one) for your aftercare needs so that they can reassess your need for medications and monitor your lab values. Today   SUBJECTIVE   No new complaints  VITAL SIGNS:  Blood  pressure (!) 143/77, pulse 75, temperature 97.6 F (36.4 C), temperature source Oral, resp. rate 18, height 5' 7"  (1.702 m), weight 93.3 kg (205 lb 9.6 oz), SpO2 100 %.  I/O:    Intake/Output Summary (Last 24 hours) at 08/10/2017 1009 Last data filed at 08/10/2017 1000 Gross per 24 hour  Intake 27.5 ml  Output 2595 ml  Net -2567.5 ml    PHYSICAL EXAMINATION:  GENERAL:  57 y.o.-year-old patient lying in the bed with no acute distress.  EYES: Pupils equal, round, reactive to [place] and accommodation. No scleral icterus. Extraocular muscles intact.  HEENT: Head atraumatic, normocephalic. Oropharynx and nasopharynx clear.  NECK:  Supple, no jugular venous distention. No thyroid enlargement, no tenderness.  LUNGS: Normal breath sounds bilaterally, no wheezing, rales,rhonchi or crepitation. No use of accessory muscles of respiration.  CARDIOVASCULAR: S1, S2 normal. No murmurs, rubs, or gallops.  ABDOMEN: Soft, non-tender, non-distended. Bowel sounds present. No organomegaly or mass.  EXTREMITIES: No pedal edema, cyanosis, or clubbing.  NEUROLOGIC:  Cranial nerves II through XII are intact. Muscle strength 5/5 in all extremities. Sensation intact. Gait not checked.  PSYCHIATRIC: The patient is alert and oriented x 3.  SKIN: No obvious rash, lesion, or ulcer.   DATA REVIEW:   CBC  Recent Labs  Lab 08/10/17 0435  WBC 6.8  HGB 13.8  HCT 39.5*  PLT 123*    Chemistries  Recent Labs  Lab 08/10/17 0435  NA 138  K 4.2  CL 107  CO2 23  GLUCOSE 98  BUN 21*  CREATININE 1.11  CALCIUM 8.5*    Microbiology Results   No results found for this or any previous visit (from the past 240 hour(s)).  RADIOLOGY:  No results found.   Management plans discussed with the patient, family and they are in agreement.  CODE STATUS:     Code Status Orders  (From admission, onward)        Start     Ordered   08/06/17 1802  Full code  Continuous     08/06/17 1801    Code Status History     Date Active Date Inactive Code Status Order ID Comments User Context   07/10/2015 08:26 07/14/2015 14:15 Full Code [id number]  Stark Klein, MD ED      TOTAL TIME TAKING CARE OF THIS PATIENT: 40 minutes.    Fritzi Mandes M.D on 08/10/2017 at 10:09 AM  Between 7am to 6pm - Pager - [phone] After 6pm go to [email or web address] - password [email or web address] [facility]  Office  [phone]  CC: Primary care physician; Patient, No Pcp Per

## 2017-08-10 NOTE — Plan of Care (Signed)
Able to Care for cath site and manage meds at home.

## 2017-08-10 NOTE — Care Management Important Message (Signed)
Important Message  Patient Details  Name: Larry Terrell MRN: [id number] Date of Birth: 1961/02/06   Medicare Important Message Given:  Yes  Signed IM notice given   Katrina Stack, RN 08/10/2017, 10:30 AM

## 2017-08-10 NOTE — Telephone Encounter (Signed)
TCM....  Patient is being discharged later today   They saw Dr Fletcher Anon  They are scheduled to see 08/20/17   They were seen for N stemi  They need to be seen within 1 week    Please call

## 2017-08-12 NOTE — Telephone Encounter (Signed)
Attempted to reach patient. Phone rang several times with no answer and no answering machine picked up.

## 2017-08-12 NOTE — Telephone Encounter (Signed)
No answer, no voicemail.

## 2017-08-16 NOTE — Telephone Encounter (Signed)
Attempted to reach patient. Phone rang several times with no answer and no answering machine picked up.  Third TCM attempt. Will close encounter.

## 2017-08-19 ENCOUNTER — Encounter: Payer: Medicare Other | Attending: Cardiovascular Disease

## 2017-08-20 ENCOUNTER — Ambulatory Visit: Payer: Medicare Other | Admitting: Cardiovascular Disease

## 2017-08-23 ENCOUNTER — Encounter: Payer: Self-pay | Admitting: Cardiovascular Disease

## 2017-09-19 ENCOUNTER — Emergency Department
Admission: EM | Admit: 2017-09-19 | Discharge: 2017-09-19 | Disposition: A | Discharge status: Home / Self Care | Payer: Medicare Other | Attending: Emergency Medicine | Admitting: Emergency Medicine

## 2017-09-19 DIAGNOSIS — I110 Hypertensive heart disease with heart failure: Secondary | ICD-10-CM | POA: Diagnosis not present

## 2017-09-19 DIAGNOSIS — J449 Chronic obstructive pulmonary disease, unspecified: Secondary | ICD-10-CM | POA: Insufficient documentation

## 2017-09-19 DIAGNOSIS — M79671 Pain in right foot: Secondary | ICD-10-CM | POA: Insufficient documentation

## 2017-09-19 DIAGNOSIS — I5032 Chronic diastolic (congestive) heart failure: Secondary | ICD-10-CM | POA: Insufficient documentation

## 2017-09-19 DIAGNOSIS — E1140 Type 2 diabetes mellitus with diabetic neuropathy, unspecified: Secondary | ICD-10-CM | POA: Diagnosis not present

## 2017-09-19 DIAGNOSIS — Z87891 Personal history of nicotine dependence: Secondary | ICD-10-CM | POA: Diagnosis not present

## 2017-09-19 DIAGNOSIS — Z794 Long term (current) use of insulin: Secondary | ICD-10-CM | POA: Diagnosis not present

## 2017-09-19 DIAGNOSIS — M79672 Pain in left foot: Secondary | ICD-10-CM | POA: Diagnosis not present

## 2017-09-19 DIAGNOSIS — Z7982 Long term (current) use of aspirin: Secondary | ICD-10-CM | POA: Insufficient documentation

## 2017-09-19 DIAGNOSIS — Z7902 Long term (current) use of antithrombotics/antiplatelets: Secondary | ICD-10-CM | POA: Insufficient documentation

## 2017-09-19 LAB — GLUCOSE, CAPILLARY: Glucose-Capillary: 274 mg/dL — ABNORMAL HIGH (ref 65–99)

## 2017-09-19 MED ORDER — OXYCODONE-ACETAMINOPHEN 5-325 MG PO TABS
1.5000 | ORAL_TABLET | Freq: Once | ORAL | Status: AC
  Administered 2017-09-19: 1.5 via ORAL
  Filled 2017-09-19: qty 2

## 2017-09-19 NOTE — ED Provider Notes (Signed)
[facility] Emergency Department Provider Note  ____________________________________________  Time seen: Approximately 10:37 PM  I have reviewed the triage vital signs and the nursing notes.   HISTORY  Chief Complaint Foot Pain    HPI Larry Terrell is a 57 y.o. male that presents to the emergency department for pain control of bilateral foot pain.  Patient takes oxycodone daily for pain but ran out of medication.  He does not see his doctor until Tuesday and would like a refill of his oxycodone.  Pain has not changed in character and is only worse because he does not have his pain medication.  He states that when he was in the hospital 1 month ago, he was taking oxycodone, tramadol, gabapentin and had no pain.  He states that this worked really well and would like to consider this regimen again.  No trauma.  No wounds, erythema, swelling.  Past Medical History:  Diagnosis Date  . CHF (congestive heart failure) (HCC)    diastolic (echo at [facility] 5176) EF 55-60%  . Cirrhosis ([place])    NASH per records  . COPD (chronic obstructive pulmonary disease) ([place])   . Depression   . Diabetes mellitus with neuropathy ([place])   . Heart attack ([place])   . Neuropathy   . OSA (obstructive sleep apnea)   . Peripheral vascular disease due to secondary diabetes mellitus ([place])   . Splenomegaly   . Temporal giant cell arteritis ([place])   . TIA (transient ischemic attack) April 2016    Patient Active Problem List   Diagnosis Date Noted  . NSTEMI (non-ST elevated myocardial infarction) ([place]) 08/06/2017  . Acute appendicitis 07/10/2015  . S/P laparoscopic appendectomy 07/10/2015  . Appendicitis, acute   . Diabetes mellitus with complication ([place])   . Chronic congestive heart failure with left ventricular diastolic dysfunction ([place])   . Lactic acidosis   . Acute respiratory failure with hypoxia [facility])     Past Surgical History:  Procedure Laterality Date  . AORTA - FEMORAL  ARTERY BYPASS GRAFT Bilateral   . CARDIAC CATHETERIZATION    . cardiac stents  July 2015   . CORONARY STENT INTERVENTION N/A 08/09/2017   Procedure: CORONARY STENT INTERVENTION;  Surgeon: Wellington Hampshire, MD;  Location: [place] CV LAB;  Service: Cardiovascular;  Laterality: N/A;  . LAPAROSCOPIC APPENDECTOMY N/A 07/10/2015   Procedure: APPENDECTOMY LAPAROSCOPIC;  Surgeon: Rolm Bookbinder, MD;  Location: [place];  Service: General;  Laterality: N/A;  . LEFT HEART CATH AND CORONARY ANGIOGRAPHY N/A 08/09/2017   Procedure: LEFT HEART CATH AND CORONARY ANGIOGRAPHY;  Surgeon: Wellington Hampshire, MD;  Location: [place] CV LAB;  Service: Cardiovascular;  Laterality: N/A;    Prior to Admission medications   Medication Sig Start Date End Date Taking? Authorizing Provider  aspirin 81 MG chewable tablet Chew 1 tablet (81 mg total) by mouth daily. 08/11/17   Fritzi Mandes, MD  atorvastatin (LIPITOR) 40 MG tablet Take 40 mg by mouth daily.     [provider]  clopidogrel (PLAVIX) 75 MG tablet Take 1 tablet (75 mg total) by mouth daily with breakfast. 08/11/17   Fritzi Mandes, MD  insulin NPH-regular Human (NOVOLIN 70/30) (70-30) 100 UNIT/ML injection Inject 20-24 Units into the skin 2 (two) times daily. Take 24units in the morning and 20units in the evening.    [provider]  metFORMIN (GLUCOPHAGE) 500 MG tablet Take 500 mg by mouth 2 (two) times daily with a meal.    [provider]  metoprolol (LOPRESSOR) 50 MG tablet Take 50 mg by mouth 2 (two) times daily.    [provider]  nitroGLYCERIN (NITROSTAT) 0.4 MG SL tablet Place 0.4 mg under the tongue every 5 (five) minutes as needed for chest pain.    [provider]  oxyCODONE-acetaminophen (ROXICET) 5-325 MG tablet Take 1 tablet by mouth every 6 (six) hours as needed for moderate pain or severe pain. Patient not taking: Reported on 08/06/2017 01/14/16   Harvest Dark, MD  traMADol (ULTRAM) 50 MG tablet  Take 1 tablet (50 mg total) by mouth every 6 (six) hours as needed. Patient taking differently: Take 50 mg by mouth every 6 (six) hours as needed for moderate pain.  05/14/15   Loney Hering, MD  venlafaxine XR (EFFEXOR-XR) 150 MG 24 hr capsule Take 150 mg by mouth daily with breakfast.    [provider]    Allergies Ace inhibitors  Family History  Problem Relation Age of Onset  . Heart attack Mother 57  . Stroke Mother   . Heart attack Father   . Alzheimer's disease Father     Social History Social History   Tobacco Use  . Smoking status: Former Research scientist (life sciences)  . Smokeless tobacco: Never Used  . Tobacco comment: Quit 20016 per records  Substance Use Topics  . Alcohol use: No    Alcohol/week: 0.0 oz  . Drug use: No     Review of Systems  Constitutional: No fever/chills Respiratory: No SOB. Gastrointestinal:  No nausea, no vomiting.  Musculoskeletal: Positive for foot pain. Skin: Negative for rash, abrasions, lacerations, ecchymosis.   ____________________________________________   PHYSICAL EXAM:  VITAL SIGNS: ED Triage Vitals [09/19/17 2055]  Enc Vitals Group     BP (!) 163/86     Pulse Rate 85     Resp 17     Temp 97.9 F (36.6 C)     Temp Source Oral     SpO2 98 %     Weight 205 lb (93 kg)     Height      Head Circumference      Peak Flow      Pain Score 8     Pain Loc      Pain Edu?      Excl. in [place]?      Constitutional: Alert and oriented. Well appearing and in no acute distress. Eyes: Conjunctivae are normal. PERRL. EOMI. Head: Atraumatic. ENT:      Ears:      Nose: No congestion/rhinnorhea.      Mouth/Throat: Mucous membranes are moist.  Neck: No stridor.   Cardiovascular: Normal rate, regular rhythm.  Good peripheral circulation. Respiratory: Normal respiratory effort without tachypnea or retractions. Lungs CTAB. Good air entry to the bases with no decreased or absent breath sounds. Musculoskeletal: Full range of motion to all  extremities. No gross deformities appreciated.  No tenderness to palpation of bilateral feet.  No erythema.  No swelling.  No wounds. Neurologic:  Normal speech and language. No gross focal neurologic deficits are appreciated.  Skin:  Skin is warm, dry and intact. No rash noted.   ____________________________________________   LABS (all labs ordered are listed, but only abnormal results are displayed)  Labs Reviewed  GLUCOSE, CAPILLARY - Abnormal; Notable for the following components:      Result Value   Glucose-Capillary 274 (*)    All other components within normal limits   ____________________________________________  EKG   ____________________________________________  RADIOLOGY  No results found.  ____________________________________________    PROCEDURES  Procedure(s) performed:    Procedures    Medications  oxyCODONE-acetaminophen (PERCOCET/ROXICET) 5-325 MG per tablet 1.5 tablet (1.5 tablets Oral Given 09/19/17 2228)     ____________________________________________   INITIAL IMPRESSION / ASSESSMENT AND PLAN / ED COURSE  Pertinent labs & imaging results that were available during my care of the patient were reviewed by me and considered in my medical decision making (see chart for details).  Review of the [place] CSRS was performed in accordance of the [place] prior to dispensing any controlled drugs.   Patient presented to the emergency department for pain control of bilateral foot pain.  He takes oxycodone daily but is out of medication.  We discussed that I could give him a dose of medication here in the ED but he would need to call his PCP in the morning for early prescription.  Patient is to follow up with PCP as directed. Patient is given ED precautions to return to the ED for any worsening or new symptoms.     ____________________________________________  FINAL CLINICAL IMPRESSION(S) / ED DIAGNOSES  Final diagnoses:  Pain in both feet       NEW MEDICATIONS STARTED DURING THIS VISIT:  ED Discharge Orders    None          This chart was dictated using voice recognition software/Dragon. Despite best efforts to proofread, errors can occur which can change the meaning. Any change was purely unintentional.    Laban Emperor, PA-C 09/19/17 [place], [place], MD 09/23/17 1459

## 2017-09-19 NOTE — ED Notes (Signed)
Pt. Verbalizes understanding of d/c instructions, medications, and follow-up. VS stable.  Pt. In NAD at time of d/c and denies further concerns regarding this visit. Pt. Stable at the time of departure from the unit, departing unit by the safest and most appropriate manner per that pt condition and limitations with all belongings accounted for. Pt advised to return to the ED at any time for emergent concerns, or for new/worsening symptoms.

## 2017-09-19 NOTE — ED Triage Notes (Signed)
Patient c/o increased diabetic foot pain. Patient c/o redness/pain to great toe left foot. Patient reports hx of diabetes, and blood clots to bilateral upper legs.

## 2017-09-19 NOTE — ED Notes (Signed)
Patient states his "diabetic feet are hurting" and he has an appointment with his doctor on Tuesday but can't wait that long.

## 2017-09-19 NOTE — ED Notes (Signed)
Pt reports taking gabapentin and oxycodone for diabetic nerve pain in his feet, but "I think she needs to increase it because it hurts now and I have to walk with the walker"

## 2017-09-19 NOTE — ED Notes (Signed)
No orders at this time per MD Archie Balboa

## 2017-11-14 ENCOUNTER — Encounter: Payer: Self-pay | Admitting: Emergency Medicine

## 2017-11-14 ENCOUNTER — Emergency Department: Payer: Medicare Other

## 2017-11-14 ENCOUNTER — Emergency Department
Admission: EM | Admit: 2017-11-14 | Discharge: 2017-11-14 | Disposition: A | Discharge status: Home / Self Care | Payer: Medicare Other | Attending: Emergency Medicine | Admitting: Emergency Medicine

## 2017-11-14 ENCOUNTER — Other Ambulatory Visit: Payer: Self-pay

## 2017-11-14 DIAGNOSIS — J449 Chronic obstructive pulmonary disease, unspecified: Secondary | ICD-10-CM | POA: Insufficient documentation

## 2017-11-14 DIAGNOSIS — E1140 Type 2 diabetes mellitus with diabetic neuropathy, unspecified: Secondary | ICD-10-CM | POA: Insufficient documentation

## 2017-11-14 DIAGNOSIS — Z8673 Personal history of transient ischemic attack (TIA), and cerebral infarction without residual deficits: Secondary | ICD-10-CM | POA: Insufficient documentation

## 2017-11-14 DIAGNOSIS — M79602 Pain in left arm: Secondary | ICD-10-CM | POA: Insufficient documentation

## 2017-11-14 DIAGNOSIS — R079 Chest pain, unspecified: Secondary | ICD-10-CM | POA: Insufficient documentation

## 2017-11-14 DIAGNOSIS — Z794 Long term (current) use of insulin: Secondary | ICD-10-CM | POA: Insufficient documentation

## 2017-11-14 DIAGNOSIS — I252 Old myocardial infarction: Secondary | ICD-10-CM | POA: Diagnosis not present

## 2017-11-14 DIAGNOSIS — Z7902 Long term (current) use of antithrombotics/antiplatelets: Secondary | ICD-10-CM | POA: Insufficient documentation

## 2017-11-14 DIAGNOSIS — I5032 Chronic diastolic (congestive) heart failure: Secondary | ICD-10-CM | POA: Diagnosis not present

## 2017-11-14 DIAGNOSIS — Z87891 Personal history of nicotine dependence: Secondary | ICD-10-CM | POA: Insufficient documentation

## 2017-11-14 DIAGNOSIS — Z79899 Other long term (current) drug therapy: Secondary | ICD-10-CM | POA: Diagnosis not present

## 2017-11-14 DIAGNOSIS — R110 Nausea: Secondary | ICD-10-CM | POA: Diagnosis not present

## 2017-11-14 DIAGNOSIS — Z7982 Long term (current) use of aspirin: Secondary | ICD-10-CM | POA: Diagnosis not present

## 2017-11-14 LAB — BASIC METABOLIC PANEL
ANION GAP: 9 (ref 5–15)
BUN: 14 mg/dL (ref 6–20)
CALCIUM: 9.5 mg/dL (ref 8.9–10.3)
CO2: 27 mmol/L (ref 22–32)
Chloride: 101 mmol/L (ref 101–111)
Creatinine, Ser: 1.04 mg/dL (ref 0.61–1.24)
Glucose, Bld: 155 mg/dL — ABNORMAL HIGH (ref 65–99)
Potassium: 4.3 mmol/L (ref 3.5–5.1)
Sodium: 137 mmol/L (ref 135–145)

## 2017-11-14 LAB — TROPONIN I

## 2017-11-14 LAB — CBC
HCT: 40.9 % (ref 40.0–52.0)
HEMOGLOBIN: 14.5 g/dL (ref 13.0–18.0)
MCH: 33.3 pg (ref 26.0–34.0)
MCHC: 35.4 g/dL (ref 32.0–36.0)
MCV: 94.2 fL (ref 80.0–100.0)
PLATELETS: 165 10*3/uL (ref 150–440)
RBC: 4.34 MIL/uL — AB (ref 4.40–5.90)
RDW: 15.3 % — ABNORMAL HIGH (ref 11.5–14.5)
WBC: 6.6 10*3/uL (ref 3.8–10.6)

## 2017-11-14 MED ORDER — ONDANSETRON 4 MG PO TBDP: 4.0000 mg | ORAL_TABLET | Freq: Three times a day (TID) | ORAL | 0 refills | Status: DC | PRN

## 2017-11-14 MED ORDER — ONDANSETRON HCL 4 MG/2ML IJ SOLN
4.0000 mg | Freq: Once | INTRAMUSCULAR | Status: AC
  Administered 2017-11-14: 4 mg via INTRAVENOUS
  Filled 2017-11-14: qty 2

## 2017-11-14 NOTE — ED Provider Notes (Signed)
[facility] Emergency Department Provider Note       Time seen: ----------------------------------------- 7:41 PM on 11/14/2017 -----------------------------------------   I have reviewed the triage vital signs and the nursing notes.  HISTORY   Chief Complaint Chest Pain    HPI Larry Terrell is a 57 y.o. male with a history of CHF, cirrhosis, depression, COPD, diabetes, sleep apnea, TIA who presents to the ED for left arm pain since yesterday along with some chest pain.  Patient took nitroglycerin this morning and helped some of the pain but it then returned.  He denies shortness of breath but has had nausea for the past week.  Patient states symptoms feel similar to previous MI.  Past Medical History:  Diagnosis Date  . CHF (congestive heart failure) (HCC)    diastolic (echo at [facility] 9357) EF 55-60%  . Cirrhosis ([place])    NASH per records  . COPD (chronic obstructive pulmonary disease) ([place])   . Depression   . Diabetes mellitus with neuropathy ([place])   . Heart attack ([place])   . Neuropathy   . OSA (obstructive sleep apnea)   . Peripheral vascular disease due to secondary diabetes mellitus ([place])   . Splenomegaly   . Temporal giant cell arteritis ([place])   . TIA (transient ischemic attack) April 2016    Patient Active Problem List   Diagnosis Date Noted  . NSTEMI (non-ST elevated myocardial infarction) ([place]) 08/06/2017  . Acute appendicitis 07/10/2015  . S/P laparoscopic appendectomy 07/10/2015  . Appendicitis, acute   . Diabetes mellitus with complication ([place])   . Chronic congestive heart failure with left ventricular diastolic dysfunction ([place])   . Lactic acidosis   . Acute respiratory failure with hypoxia [facility])     Past Surgical History:  Procedure Laterality Date  . AORTA - FEMORAL ARTERY BYPASS GRAFT Bilateral   . CARDIAC CATHETERIZATION    . cardiac stents  July 2015   . CORONARY STENT INTERVENTION N/A 08/09/2017   Procedure: CORONARY  STENT INTERVENTION;  Surgeon: Wellington Hampshire, MD;  Location: [place] CV LAB;  Service: Cardiovascular;  Laterality: N/A;  . LAPAROSCOPIC APPENDECTOMY N/A 07/10/2015   Procedure: APPENDECTOMY LAPAROSCOPIC;  Surgeon: Rolm Bookbinder, MD;  Location: [place];  Service: General;  Laterality: N/A;  . LEFT HEART CATH AND CORONARY ANGIOGRAPHY N/A 08/09/2017   Procedure: LEFT HEART CATH AND CORONARY ANGIOGRAPHY;  Surgeon: Wellington Hampshire, MD;  Location: [place] CV LAB;  Service: Cardiovascular;  Laterality: N/A;    Allergies Ace inhibitors  Social History Social History   Tobacco Use  . Smoking status: Former Research scientist (life sciences)  . Smokeless tobacco: Never Used  . Tobacco comment: Quit 20016 per records  Substance Use Topics  . Alcohol use: No    Alcohol/week: 0.0 oz  . Drug use: No   Review of Systems Constitutional: Negative for fever. Cardiovascular: Positive for chest pain Respiratory: Negative for shortness of breath. Gastrointestinal: Negative for abdominal pain, vomiting and diarrhea. Musculoskeletal: Positive for arm pain Skin: Negative for rash. Neurological: Negative for headaches, focal weakness or numbness.  All systems negative/normal/unremarkable except as stated in the HPI  ____________________________________________   PHYSICAL EXAM:  VITAL SIGNS: ED Triage Vitals  Enc Vitals Group     BP 11/14/17 1754 (!) 189/82     Pulse Rate 11/14/17 1754 62     Resp 11/14/17 1754 20     Temp 11/14/17 1754 98 F (36.7 C)     Temp Source 11/14/17 1754 Oral  SpO2 11/14/17 1754 100 %     Weight 11/14/17 1752 200 lb (90.7 kg)     Height 11/14/17 1752 5' 7"  (1.702 m)     Head Circumference --      Peak Flow --      Pain Score 11/14/17 1751 4     Pain Loc --      Pain Edu? --      Excl. in [place]? --    Constitutional: Alert and oriented. Well appearing and in no distress. Eyes: Conjunctivae are normal. Normal extraocular movements. ENT   Head: Normocephalic and  atraumatic.   Nose: No congestion/rhinnorhea.   Mouth/Throat: Mucous membranes are moist.   Neck: No stridor. Cardiovascular: Normal rate, regular rhythm. No murmurs, rubs, or gallops. Respiratory: Normal respiratory effort without tachypnea nor retractions. Breath sounds are clear and equal bilaterally. No wheezes/rales/rhonchi. Gastrointestinal: Soft and nontender. Normal bowel sounds Musculoskeletal: Nontender with normal range of motion in extremities. No lower extremity tenderness nor edema. Neurologic:  Normal speech and language. No gross focal neurologic deficits are appreciated.  Skin:  Skin is warm, dry and intact. No rash noted. Psychiatric: Mood and affect are normal. Speech and behavior are normal.  ____________________________________________  EKG: Interpreted by me.  Sinus rhythm with first-degree AV block, left anterior fascicular block, inferior and/or anterior infarct age-indeterminate. EKG is unchanged from prior ____________________________________________  ED COURSE:  As part of my medical decision making, I reviewed the following data within the [place] History obtained from family if available, nursing notes, old chart and ekg, as well as notes from prior ED visits. Patient presented for chest pain, we will assess with labs and imaging as indicated at this time.   Procedures ____________________________________________   LABS (pertinent positives/negatives)  Labs Reviewed  BASIC METABOLIC PANEL - Abnormal; Notable for the following components:      Result Value   Glucose, Bld 155 (*)    All other components within normal limits  CBC - Abnormal; Notable for the following components:   RBC 4.34 (*)    RDW 15.3 (*)    All other components within normal limits  TROPONIN I  TROPONIN I    RADIOLOGY Images were viewed by me  Chest x-ray does not reveal any acute  process  ____________________________________________  DIFFERENTIAL DIAGNOSIS   Unstable angina, MI, PE, pneumothorax, musculoskeletal pain, GERD  FINAL ASSESSMENT AND PLAN  Arm pain, nausea   Plan: The patient had presented for essentially left arm pain and nausea. Patient's labs were unremarkable including repeat troponin. Patient's imaging did not reveal any acute process.  Patient will be referred to for close outpatient follow-up with his cardiologist.   Laurence Aly, MD   Note: This note was generated in part or whole with voice recognition software. Voice recognition is usually quite accurate but there are transcription errors that can and very often do occur. I apologize for any typographical errors that were not detected and corrected.     Earleen Newport, MD 11/14/17 [phone]

## 2017-11-14 NOTE — ED Triage Notes (Signed)
Here for left arm pain since yesterday along with some chest pain.  Took NTG this morning and helped some but returned. Denies [facility] but has had nausea over last week.  Hx MI.  Feels similar to heart attack.

## 2017-11-14 NOTE — ED Notes (Signed)
Redrew green to and sent to lab, they said the last label was distorted and patient name was unreadable.

## 2017-11-14 NOTE — ED Notes (Signed)
ED Provider at bedside. 

## 2017-12-26 ENCOUNTER — Emergency Department
Admission: EM | Admit: 2017-12-26 | Discharge: 2017-12-26 | Disposition: A | Discharge status: Home / Self Care | Payer: Medicare Other | Attending: Emergency Medicine | Admitting: Emergency Medicine

## 2017-12-26 ENCOUNTER — Other Ambulatory Visit: Payer: Self-pay

## 2017-12-26 DIAGNOSIS — R220 Localized swelling, mass and lump, head: Secondary | ICD-10-CM | POA: Insufficient documentation

## 2017-12-26 DIAGNOSIS — Z87891 Personal history of nicotine dependence: Secondary | ICD-10-CM | POA: Insufficient documentation

## 2017-12-26 DIAGNOSIS — E1140 Type 2 diabetes mellitus with diabetic neuropathy, unspecified: Secondary | ICD-10-CM | POA: Insufficient documentation

## 2017-12-26 DIAGNOSIS — J449 Chronic obstructive pulmonary disease, unspecified: Secondary | ICD-10-CM | POA: Insufficient documentation

## 2017-12-26 DIAGNOSIS — I110 Hypertensive heart disease with heart failure: Secondary | ICD-10-CM | POA: Diagnosis not present

## 2017-12-26 DIAGNOSIS — I509 Heart failure, unspecified: Secondary | ICD-10-CM | POA: Insufficient documentation

## 2017-12-26 DIAGNOSIS — Z79899 Other long term (current) drug therapy: Secondary | ICD-10-CM | POA: Diagnosis not present

## 2017-12-26 DIAGNOSIS — Z7902 Long term (current) use of antithrombotics/antiplatelets: Secondary | ICD-10-CM | POA: Insufficient documentation

## 2017-12-26 DIAGNOSIS — T7840XA Allergy, unspecified, initial encounter: Secondary | ICD-10-CM

## 2017-12-26 DIAGNOSIS — Z7982 Long term (current) use of aspirin: Secondary | ICD-10-CM | POA: Insufficient documentation

## 2017-12-26 MED ORDER — EPINEPHRINE 0.3 MG/0.3ML IJ SOAJ: 0.3000 mg | Freq: Once | INTRAMUSCULAR | 0 refills | Status: AC

## 2017-12-26 MED ORDER — PREDNISONE 20 MG PO TABS
40.0000 mg | ORAL_TABLET | Freq: Once | ORAL | Status: AC
  Administered 2017-12-26: 40 mg via ORAL
  Filled 2017-12-26: qty 4

## 2017-12-26 MED ORDER — PREDNISONE 20 MG PO TABS: ORAL_TABLET | ORAL | 0 refills | Status: DC

## 2017-12-26 MED ORDER — DIPHENHYDRAMINE HCL 25 MG PO CAPS
50.0000 mg | ORAL_CAPSULE | Freq: Once | ORAL | Status: AC
  Administered 2017-12-26: 50 mg via ORAL
  Filled 2017-12-26: qty 2

## 2017-12-26 MED ORDER — FAMOTIDINE 20 MG PO TABS: 20.0000 mg | ORAL_TABLET | Freq: Two times a day (BID) | ORAL | 0 refills | Status: AC

## 2017-12-26 MED ORDER — FAMOTIDINE 20 MG PO TABS
20.0000 mg | ORAL_TABLET | Freq: Once | ORAL | Status: AC
  Administered 2017-12-26: 20 mg via ORAL
  Filled 2017-12-26: qty 1

## 2017-12-26 NOTE — Discharge Instructions (Addendum)
1. Take the following medicines for the next 4 days: Prednisone 79m daily Pepcid 210mtwice daily 2. Take Benadryl as needed for itching. 3. Use Epi-Pen in case of acute, life-threatening allergic reaction. 4. Return to the ER for worsening symptoms, persistent vomiting, difficulty breathing or other concerns.

## 2017-12-26 NOTE — ED Provider Notes (Signed)
[facility] Emergency Department Provider Note   ____________________________________________   First MD Initiated Contact with Patient 12/26/17 [phone]     (approximate)  I have reviewed the triage vital signs and the nursing notes.   HISTORY  Chief Complaint Oral Swelling    HPI Larry Terrell is a 57 y.o. male who presents to the ED from home with a chief complaint of lower lip swelling.  Patient reports around 12:30 AM he was still awake and felt like his left lower lip was swollen.  Denies tongue swelling, hoarse voice or shortness of breath.  Wanted to be evaluated before he laid down to go to sleep tonight.  Reports taking gabapentin for neuropathy and was started on increased dose which she took for the first time today.  Denies other new foods, exposures or medicines.  Denies fever, chills, chest pain, shortness of breath, abdominal pain, nausea or vomiting.  Denies recent travel or trauma.  Does not take ACE inhibitor.   Past Medical History:  Diagnosis Date  . CHF (congestive heart failure) (HCC)    diastolic (echo at [facility] 5366) EF 55-60%  . Cirrhosis ([place])    NASH per records  . COPD (chronic obstructive pulmonary disease) ([place])   . Depression   . Diabetes mellitus with neuropathy ([place])   . Heart attack ([place])   . Neuropathy   . OSA (obstructive sleep apnea)   . Peripheral vascular disease due to secondary diabetes mellitus ([place])   . Splenomegaly   . Temporal giant cell arteritis ([place])   . TIA (transient ischemic attack) April 2016    Patient Active Problem List   Diagnosis Date Noted  . NSTEMI (non-ST elevated myocardial infarction) ([place]) 08/06/2017  . Acute appendicitis 07/10/2015  . S/P laparoscopic appendectomy 07/10/2015  . Appendicitis, acute   . Diabetes mellitus with complication ([place])   . Chronic congestive heart failure with left ventricular diastolic dysfunction ([place])   . Lactic acidosis   . Acute respiratory failure with  hypoxia [facility])     Past Surgical History:  Procedure Laterality Date  . AORTA - FEMORAL ARTERY BYPASS GRAFT Bilateral   . CARDIAC CATHETERIZATION    . cardiac stents  July 2015   . CORONARY STENT INTERVENTION N/A 08/09/2017   Procedure: CORONARY STENT INTERVENTION;  Surgeon: Wellington Hampshire, MD;  Location: [place] [facility];  Service: Cardiovascular;  Laterality: N/A;  . LAPAROSCOPIC APPENDECTOMY N/A 07/10/2015   Procedure: APPENDECTOMY LAPAROSCOPIC;  Surgeon: Rolm Bookbinder, MD;  Location: [place];  Service: General;  Laterality: N/A;  . LEFT HEART CATH AND CORONARY ANGIOGRAPHY N/A 08/09/2017   Procedure: LEFT HEART CATH AND CORONARY ANGIOGRAPHY;  Surgeon: Wellington Hampshire, MD;  Location: [place] [facility];  Service: Cardiovascular;  Laterality: N/A;    Prior to Admission medications   Medication Sig Start Date End Date Taking? Authorizing Provider  aspirin 81 MG chewable tablet Chew 1 tablet (81 mg total) by mouth daily. 08/11/17   Fritzi Mandes, MD  atorvastatin (LIPITOR) 40 MG tablet Take 40 mg by mouth daily.     [provider]  clopidogrel (PLAVIX) 75 MG tablet Take 1 tablet (75 mg total) by mouth daily with breakfast. 08/11/17   Fritzi Mandes, MD  EPINEPHrine 0.3 mg/0.3 mL IJ SOAJ injection Inject 0.3 mLs (0.3 mg total) into the muscle once for 1 dose. 12/26/17 12/26/17  Paulette Blanch, MD  famotidine (PEPCID) 20 MG tablet Take 1 tablet (20 mg total) by mouth  2 (two) times daily. 12/26/17   Paulette Blanch, MD  insulin NPH-regular Human (NOVOLIN 70/30) (70-30) 100 UNIT/ML injection Inject 20-24 Units into the skin 2 (two) times daily. Take 24units in the morning and 20units in the evening.    [provider]  metFORMIN (GLUCOPHAGE) 500 MG tablet Take 500 mg by mouth 2 (two) times daily with a meal.    [provider]  metoprolol (LOPRESSOR) 50 MG tablet Take 50 mg by mouth 2 (two) times daily.    [provider]  nitroGLYCERIN (NITROSTAT) 0.4 MG SL  tablet Place 0.4 mg under the tongue every 5 (five) minutes as needed for chest pain.    [provider]  ondansetron (ZOFRAN ODT) 4 MG disintegrating tablet Take 1 tablet (4 mg total) by mouth every 8 (eight) hours as needed for nausea or vomiting. 11/14/17   Earleen Newport, MD  oxyCODONE-acetaminophen (ROXICET) 5-325 MG tablet Take 1 tablet by mouth every 6 (six) hours as needed for moderate pain or severe pain. Patient not taking: Reported on 08/06/2017 01/14/16   Harvest Dark, MD  predniSONE (DELTASONE) 20 MG tablet 2 tablets daily x 4 days 12/26/17   Paulette Blanch, MD  traMADol (ULTRAM) 50 MG tablet Take 1 tablet (50 mg total) by mouth every 6 (six) hours as needed. Patient taking differently: Take 50 mg by mouth every 6 (six) hours as needed for moderate pain.  05/14/15   Loney Hering, MD  venlafaxine XR (EFFEXOR-XR) 150 MG 24 hr capsule Take 150 mg by mouth daily with breakfast.    [provider]    Allergies Ace inhibitors  Family History  Problem Relation Age of Onset  . Heart attack Mother 25  . Stroke Mother   . Heart attack Father   . Alzheimer's disease Father     Social History Social History   Tobacco Use  . Smoking status: Former Research scientist (life sciences)  . Smokeless tobacco: Never Used  . Tobacco comment: Quit 20016 per records  Substance Use Topics  . Alcohol use: No    Alcohol/week: 0.0 oz  . Drug use: No    Review of Systems  Constitutional: No fever/chills Eyes: No visual changes. ENT: Positive for lower lip swelling.  No sore throat. Cardiovascular: Denies chest pain. Respiratory: Denies shortness of breath. Gastrointestinal: No abdominal pain.  No nausea, no vomiting.  No diarrhea.  No constipation. Genitourinary: Negative for dysuria. Musculoskeletal: Negative for back pain. Skin: Negative for rash. Neurological: Negative for headaches, focal weakness or numbness.   ____________________________________________   PHYSICAL  EXAM:  VITAL SIGNS: ED Triage Vitals  Enc Vitals Group     BP 12/26/17 0141 (!) 215/83     Pulse Rate 12/26/17 0141 76     Resp 12/26/17 0141 16     Temp 12/26/17 0141 98 F (36.7 C)     Temp Source 12/26/17 0141 Axillary     SpO2 12/26/17 0141 100 %     Weight 12/26/17 0143 206 lb (93.4 kg)     Height 12/26/17 0143 5' 7"  (1.702 m)     Head Circumference --      Peak Flow --      Pain Score 12/26/17 0142 2     Pain Loc --      Pain Edu? --      Excl. in [place]? --     Constitutional: Alert and oriented. Well appearing and in no acute distress. Eyes: Conjunctivae are normal. PERRL. EOMI. Head:  Atraumatic. Nose: No congestion/rhinnorhea. Mouth/Throat: Mucous membranes are moist.  No appreciable swelling to lips.  Tongue is not swollen.  There is no facial swelling.  There is no hoarse or muffled voice.  There is no drooling.  Patient is tolerating his secretions well.. Neck: No stridor.  No neck mass or swelling. Cardiovascular: Normal rate, regular rhythm. Grossly normal heart sounds.  Good peripheral circulation. Respiratory: Normal respiratory effort.  No retractions. Lungs CTAB. Gastrointestinal: Soft and nontender. No distention. No abdominal bruits. No CVA tenderness. Musculoskeletal: No lower extremity tenderness nor edema.  No joint effusions. Neurologic:  Normal speech and language. No gross focal neurologic deficits are appreciated. No gait instability. Skin:  Skin is warm, dry and intact. No rash noted. Psychiatric: Mood and affect are normal. Speech and behavior are normal.  ____________________________________________   LABS (all labs ordered are listed, but only abnormal results are displayed)  Labs Reviewed - No data to display ____________________________________________  EKG  None ____________________________________________  RADIOLOGY  ED MD interpretation: None  Official radiology report(s): No results  found.  ____________________________________________   PROCEDURES  Procedure(s) performed: None  Procedures  Critical Care performed: No  ____________________________________________   INITIAL IMPRESSION / ASSESSMENT AND PLAN / ED COURSE  As part of my medical decision making, I reviewed the following data within the [place] History obtained from family, Nursing notes reviewed and incorporated, Old chart reviewed and Notes from prior ED visits   57 year old male who presents for a sensation of lower lip swelling.  Took an increased dosage of gabapentin today.  There is no appreciable lip swelling on my examination.  Patient's brother is at bedside and also notes no appreciable lip swelling.  For this reason, I will not treat with epinephrine.  However, will treat with Benadryl, prednisone and Pepcid.  Will treat with slightly lower dose of prednisone given that patient is a diabetic.  Encouraged patient to go back to his lower dose of gabapentin until he can reach his doctor.  Strict return precautions given.  Patient verbalizes understanding agrees with plan of care.      ____________________________________________   FINAL CLINICAL IMPRESSION(S) / ED DIAGNOSES  Final diagnoses:  Allergic reaction to drug, initial encounter  Lip swelling     ED Discharge Orders        Ordered    famotidine (PEPCID) 20 MG tablet  2 times daily     12/26/17 0247    predniSONE (DELTASONE) 20 MG tablet     12/26/17 0247    EPINEPHrine 0.3 mg/0.3 mL IJ SOAJ injection   Once     12/26/17 0247       Note:  This document was prepared using Dragon voice recognition software and may include unintentional dictation errors.    Paulette Blanch, MD 12/26/17 [phone]

## 2017-12-26 NOTE — ED Notes (Signed)
Pt reports tonight around 1230 am he felt like his lower lip was swelling. Denies shortness of breath. Reports increased dosage of his gabapentin today.

## 2017-12-26 NOTE — ED Triage Notes (Signed)
Patient reports feels like his lips are swelling.  Patient with no obvious swelling noted, no respiratory distress noted, patient is able to control own secretions.

## 2018-03-23 ENCOUNTER — Emergency Department
Admission: EM | Admit: 2018-03-23 | Discharge: 2018-03-24 | Disposition: A | Discharge status: Home / Self Care | Payer: Medicare Other | Attending: Student in an Organized Health Care Education/Training Program | Admitting: Student in an Organized Health Care Education/Training Program

## 2018-03-23 ENCOUNTER — Encounter: Payer: Self-pay | Admitting: Emergency Medicine

## 2018-03-23 ENCOUNTER — Emergency Department: Payer: Medicare Other

## 2018-03-23 DIAGNOSIS — J449 Chronic obstructive pulmonary disease, unspecified: Secondary | ICD-10-CM | POA: Insufficient documentation

## 2018-03-23 DIAGNOSIS — Z79899 Other long term (current) drug therapy: Secondary | ICD-10-CM | POA: Diagnosis not present

## 2018-03-23 DIAGNOSIS — Z8673 Personal history of transient ischemic attack (TIA), and cerebral infarction without residual deficits: Secondary | ICD-10-CM | POA: Diagnosis not present

## 2018-03-23 DIAGNOSIS — I252 Old myocardial infarction: Secondary | ICD-10-CM | POA: Diagnosis not present

## 2018-03-23 DIAGNOSIS — I5030 Unspecified diastolic (congestive) heart failure: Secondary | ICD-10-CM | POA: Insufficient documentation

## 2018-03-23 DIAGNOSIS — Z7902 Long term (current) use of antithrombotics/antiplatelets: Secondary | ICD-10-CM | POA: Diagnosis not present

## 2018-03-23 DIAGNOSIS — R55 Syncope and collapse: Secondary | ICD-10-CM

## 2018-03-23 DIAGNOSIS — M545 Low back pain, unspecified: Secondary | ICD-10-CM

## 2018-03-23 DIAGNOSIS — Z794 Long term (current) use of insulin: Secondary | ICD-10-CM | POA: Insufficient documentation

## 2018-03-23 DIAGNOSIS — Z87891 Personal history of nicotine dependence: Secondary | ICD-10-CM | POA: Insufficient documentation

## 2018-03-23 DIAGNOSIS — Z7982 Long term (current) use of aspirin: Secondary | ICD-10-CM | POA: Diagnosis not present

## 2018-03-23 DIAGNOSIS — F329 Major depressive disorder, single episode, unspecified: Secondary | ICD-10-CM | POA: Diagnosis not present

## 2018-03-23 DIAGNOSIS — R531 Weakness: Secondary | ICD-10-CM | POA: Diagnosis present

## 2018-03-23 DIAGNOSIS — E119 Type 2 diabetes mellitus without complications: Secondary | ICD-10-CM | POA: Insufficient documentation

## 2018-03-23 LAB — CBC
HCT: 41.9 % (ref 40.0–52.0)
Hemoglobin: 14.8 g/dL (ref 13.0–18.0)
MCH: 33.4 pg (ref 26.0–34.0)
MCHC: 35.4 g/dL (ref 32.0–36.0)
MCV: 94.3 fL (ref 80.0–100.0)
Platelets: 125 10*3/uL — ABNORMAL LOW (ref 150–440)
RBC: 4.45 MIL/uL (ref 4.40–5.90)
RDW: 15.6 % — AB (ref 11.5–14.5)
WBC: 7.8 10*3/uL (ref 3.8–10.6)

## 2018-03-23 LAB — BASIC METABOLIC PANEL
ANION GAP: 9 (ref 5–15)
BUN: 18 mg/dL (ref 6–20)
CALCIUM: 9 mg/dL (ref 8.9–10.3)
CO2: 20 mmol/L — ABNORMAL LOW (ref 22–32)
Chloride: 103 mmol/L (ref 98–111)
Creatinine, Ser: 1.42 mg/dL — ABNORMAL HIGH (ref 0.61–1.24)
GFR calc Af Amer: >60 mL/min (ref >60)
GFR, EST NON AFRICAN AMERICAN: 53 mL/min — AB (ref >60)
GLUCOSE: 465 mg/dL — AB (ref 70–99)
Potassium: 3.9 mmol/L (ref 3.5–5.1)
SODIUM: 132 mmol/L — AB (ref 135–145)

## 2018-03-23 LAB — TROPONIN I: Troponin I: <0.03 ng/mL (ref <0.03)

## 2018-03-23 MED ORDER — METOPROLOL TARTRATE 50 MG PO TABS
50.0000 mg | ORAL_TABLET | Freq: Once | ORAL | Status: AC
  Administered 2018-03-23: 50 mg via ORAL
  Filled 2018-03-23: qty 1

## 2018-03-23 MED ORDER — HYDROCODONE-ACETAMINOPHEN 5-325 MG PO TABS
1.0000 | ORAL_TABLET | Freq: Once | ORAL | Status: AC
  Administered 2018-03-23: 1 via ORAL
  Filled 2018-03-23: qty 1

## 2018-03-23 NOTE — ED Triage Notes (Signed)
Pt to ED due to weakness and multiple falls in the last week. Pt reports he feels tired all the time and is lightheaded when standing and ambulating. Pt denies hitting head today when he fell back into the recliner.

## 2018-03-23 NOTE — ED Provider Notes (Signed)
[facility] Emergency Department Provider Note    First MD Initiated Contact with Patient 03/23/18 2221     (approximate)  I have reviewed the triage vital signs and the nursing notes.   HISTORY  Chief Complaint syncope   HPI Larry Terrell is a 57 y.o. male history congestive heart failure as well as cirrhosis and COPD presents the ER after feeling generalized weakness and fall today.  States he stood up and started feeling lightheaded.  [place] backwards hitting the back of his head and is complaining of upper back pain.  Does have some shortness of breath and some difficulty completing sentences.  Denies any chest pain.  No nausea or vomiting.  The pain is mild to moderate in severity.  Study Conclusions  - Left ventricle: The cavity size was normal. There was moderate   concentric hypertrophy. Systolic function was normal. The   estimated ejection fraction was in the range of 55% to 60%. Wall   motion was normal; there were no regional wall motion   abnormalities. Features are consistent with a pseudonormal left   ventricular filling pattern, with concomitant abnormal relaxation   and increased filling pressure (grade 2 diastolic dysfunction). - Mitral valve: There was mild regurgitation. - Left atrium: The atrium was moderately dilated. - Right ventricle: Systolic function was normal.   Past Medical History:  Diagnosis Date  . CHF (congestive heart failure) (HCC)    diastolic (echo at [facility] 0102) EF 55-60%  . Cirrhosis ([place])    NASH per records  . COPD (chronic obstructive pulmonary disease) ([place])   . Depression   . Diabetes mellitus with neuropathy ([place])   . Heart attack ([place])   . Neuropathy   . OSA (obstructive sleep apnea)   . Peripheral vascular disease due to secondary diabetes mellitus ([place])   . Splenomegaly   . Temporal giant cell arteritis ([place])   . TIA (transient ischemic attack) April 2016   Family History  Problem Relation Age  of Onset  . Heart attack Mother 26  . Stroke Mother   . Heart attack Father   . Alzheimer's disease Father    Past Surgical History:  Procedure Laterality Date  . AORTA - FEMORAL ARTERY BYPASS GRAFT Bilateral   . CARDIAC CATHETERIZATION    . cardiac stents  July 2015   . CORONARY STENT INTERVENTION N/A 08/09/2017   Procedure: CORONARY STENT INTERVENTION;  Surgeon: Wellington Hampshire, MD;  Location: [place] [facility];  Service: Cardiovascular;  Laterality: N/A;  . LAPAROSCOPIC APPENDECTOMY N/A 07/10/2015   Procedure: APPENDECTOMY LAPAROSCOPIC;  Surgeon: Rolm Bookbinder, MD;  Location: [place];  Service: General;  Laterality: N/A;  . LEFT HEART CATH AND CORONARY ANGIOGRAPHY N/A 08/09/2017   Procedure: LEFT HEART CATH AND CORONARY ANGIOGRAPHY;  Surgeon: Wellington Hampshire, MD;  Location: [place] [facility];  Service: Cardiovascular;  Laterality: N/A;   Patient Active Problem List   Diagnosis Date Noted  . NSTEMI (non-ST elevated myocardial infarction) ([place]) 08/06/2017  . Acute appendicitis 07/10/2015  . S/P laparoscopic appendectomy 07/10/2015  . Appendicitis, acute   . Diabetes mellitus with complication ([place])   . Chronic congestive heart failure with left ventricular diastolic dysfunction ([place])   . Lactic acidosis   . Acute respiratory failure with hypoxia ([place])       Prior to Admission medications   Medication Sig Start Date End Date Taking? Authorizing Provider  aspirin 81 MG chewable tablet Chew 1 tablet (81 mg total)  by mouth daily. 08/11/17   Fritzi Mandes, MD  atorvastatin (LIPITOR) 40 MG tablet Take 40 mg by mouth daily.     [provider]  clopidogrel (PLAVIX) 75 MG tablet Take 1 tablet (75 mg total) by mouth daily with breakfast. 08/11/17   Fritzi Mandes, MD  famotidine (PEPCID) 20 MG tablet Take 1 tablet (20 mg total) by mouth 2 (two) times daily. 12/26/17   Paulette Blanch, MD  insulin NPH-regular Human (NOVOLIN 70/30) (70-30) 100 UNIT/ML injection Inject 20-24 Units  into the skin 2 (two) times daily. Take 24units in the morning and 20units in the evening.    [provider]  metFORMIN (GLUCOPHAGE) 500 MG tablet Take 500 mg by mouth 2 (two) times daily with a meal.    [provider]  metoprolol (LOPRESSOR) 50 MG tablet Take 50 mg by mouth 2 (two) times daily.    [provider]  nitroGLYCERIN (NITROSTAT) 0.4 MG SL tablet Place 0.4 mg under the tongue every 5 (five) minutes as needed for chest pain.    [provider]  ondansetron (ZOFRAN ODT) 4 MG disintegrating tablet Take 1 tablet (4 mg total) by mouth every 8 (eight) hours as needed for nausea or vomiting. 11/14/17   Earleen Newport, MD  oxyCODONE-acetaminophen (ROXICET) 5-325 MG tablet Take 1 tablet by mouth every 6 (six) hours as needed for moderate pain or severe pain. Patient not taking: Reported on 08/06/2017 01/14/16   Harvest Dark, MD  predniSONE (DELTASONE) 20 MG tablet 2 tablets daily x 4 days 12/26/17   Paulette Blanch, MD  traMADol (ULTRAM) 50 MG tablet Take 1 tablet (50 mg total) by mouth every 6 (six) hours as needed. Patient taking differently: Take 50 mg by mouth every 6 (six) hours as needed for moderate pain.  05/14/15   Loney Hering, MD  venlafaxine XR (EFFEXOR-XR) 150 MG 24 hr capsule Take 150 mg by mouth daily with breakfast.    [provider]    Allergies Ace inhibitors    Social History Social History   Tobacco Use  . Smoking status: Former Research scientist (life sciences)  . Smokeless tobacco: Never Used  . Tobacco comment: Quit 20016 per records  Substance Use Topics  . Alcohol use: No    Alcohol/week: 0.0 standard drinks  . Drug use: No    Review of Systems Patient denies headaches, rhinorrhea, blurry vision, numbness, shortness of breath, chest pain, edema, cough, abdominal pain, nausea, vomiting, diarrhea, dysuria, fevers, rashes or hallucinations unless otherwise stated above in  HPI. ____________________________________________   PHYSICAL EXAM:  VITAL SIGNS: Vitals:   03/23/18 2345 03/24/18 0000  BP:  (!) 171/85  Pulse: (!) 105 (!) 104  Resp: 20 (!) 22  Temp:    SpO2: (!) 89% 94%    Constitutional: Alert and oriented.  Eyes: Conjunctivae are normal.  Head: Atraumatic. Nose: No congestion/rhinnorhea. Mouth/Throat: Mucous membranes are moist.   Neck: No stridor. Painless ROM.  Cardiovascular: Normal rate, regular rhythm. Grossly normal heart sounds.  Good peripheral circulation. Respiratory: Normal respiratory effort.  No retractions. Lungs CTAB. Gastrointestinal: Soft and nontender. No distention. No abdominal bruits. No CVA tenderness. Genitourinary:  Musculoskeletal: No lower extremity tenderness nor edema.  No joint effusions. Neurologic:  Normal speech and language. No gross focal neurologic deficits are appreciated. No facial droop Skin:  Skin is warm, dry and intact. No rash noted. Psychiatric: Mood and affect are normal. Speech and behavior are normal.  ____________________________________________   LABS (all labs ordered  are listed, but only abnormal results are displayed)  Results for orders placed or performed during the hospital encounter of 03/23/18 (from the past 24 hour(s))  Basic metabolic panel     Status: Abnormal   Collection Time: 03/23/18  9:59 PM  Result Value Ref Range   Sodium 132 (L) 135 - 145 mmol/L   Potassium 3.9 3.5 - 5.1 mmol/L   Chloride 103 98 - 111 mmol/L   CO2 20 (L) 22 - 32 mmol/L   Glucose, Bld 465 (H) 70 - 99 mg/dL   BUN 18 6 - 20 mg/dL   Creatinine, Ser 1.42 (H) 0.61 - 1.24 mg/dL   Calcium 9.0 8.9 - 10.3 mg/dL   GFR calc non Af Amer 53 (L) >60 mL/min   GFR calc Af Amer >60 >60 mL/min   Anion gap 9 5 - 15  CBC     Status: Abnormal   Collection Time: 03/23/18  9:59 PM  Result Value Ref Range   WBC 7.8 3.8 - 10.6 K/uL   RBC 4.45 4.40 - 5.90 MIL/uL   Hemoglobin 14.8 13.0 - 18.0 g/dL   HCT 41.9 40.0 -  52.0 %   MCV 94.3 80.0 - 100.0 fL   MCH 33.4 26.0 - 34.0 pg   MCHC 35.4 32.0 - 36.0 g/dL   RDW 15.6 (H) 11.5 - 14.5 %   Platelets 125 (L) 150 - 440 K/uL  Troponin I     Status: None   Collection Time: 03/23/18  9:59 PM  Result Value Ref Range   Troponin I <0.03 <0.03 ng/mL  Urinalysis, Complete w Microscopic     Status: Abnormal   Collection Time: 03/23/18 11:44 PM  Result Value Ref Range   Color, Urine YELLOW (A) YELLOW   APPearance CLEAR (A) CLEAR   Specific Gravity, Urine 1.027 1.005 - 1.030   pH 6.0 5.0 - 8.0   Glucose, UA >=500 (A) NEGATIVE mg/dL   Hgb urine dipstick NEGATIVE NEGATIVE   Bilirubin Urine NEGATIVE NEGATIVE   Ketones, ur 5 (A) NEGATIVE mg/dL   Protein, ur 100 (A) NEGATIVE mg/dL   Nitrite NEGATIVE NEGATIVE   Leukocytes, UA NEGATIVE NEGATIVE   RBC / HPF 0-5 0 - 5 RBC/hpf   WBC, UA 0-5 0 - 5 WBC/hpf   Bacteria, UA NONE SEEN NONE SEEN   Squamous Epithelial / LPF 0-5 0 - 5   ____________________________________________  EKG My review and personal interpretation at Time: 21:54   Indication: syncope  Rate: 120  Rhythm: sinus Axis: left Other: poor  r wave progression.  No stemi, unchanged from previous ____________________________________________  RADIOLOGY  I personally reviewed all radiographic images ordered to evaluate for the above acute complaints and reviewed radiology reports and findings.  These findings were personally discussed with the patient.  Please see medical record for radiology report.  ____________________________________________   PROCEDURES  Procedure(s) performed:  Procedures    Critical Care performed: no ____________________________________________   INITIAL IMPRESSION / ASSESSMENT AND PLAN / ED COURSE  Pertinent labs & imaging results that were available during my care of the patient were reviewed by me and considered in my medical decision making (see chart for details).   DDX: dehydration, vasovagal, dysrhythmia, acs,  chf, sah, sdh, concussion.  Dorsie Czar Ysaguirre is a 57 y.o. who presents to the ED with symptoms as described above.  Patient hypertensive and tachycardic but has not taken his evening medications.  Denies any chest pain his primary complaint is low back pain.  Did hit his head.  States he was feeling lightheaded prior to falling after standing up quickly.  Blood will be sent for the above differential he is high risk of multiple comorbidities.  Does not seem like sudden cardiac syncopal episode but will order serial enzymes and patient.  CT imaging ordered for the above differential shows no evidence of acute abnormality.  Have discussed with the patient and available family all diagnostics and treatments performed thus far and all questions were answered to the best of my ability. The patient demonstrates understanding and agreement with plan.       As part of my medical decision making, I reviewed the following data within the [place] notes reviewed and incorporated, Labs reviewed, notes from prior ED visits and [place] Controlled Substance Database   ____________________________________________   FINAL CLINICAL IMPRESSION(S) / ED DIAGNOSES  Final diagnoses:  Fainting spell  Acute midline low back pain without sciatica      NEW MEDICATIONS STARTED DURING THIS VISIT:  New Prescriptions   No medications on file     Note:  This document was prepared using Dragon voice recognition software and may include unintentional dictation errors.    Merlyn Lot, MD 03/24/18 [phone]

## 2018-03-24 ENCOUNTER — Other Ambulatory Visit: Payer: Self-pay

## 2018-03-24 LAB — URINALYSIS, COMPLETE (UACMP) WITH MICROSCOPIC
Bacteria, UA: NONE SEEN
Bilirubin Urine: NEGATIVE
HGB URINE DIPSTICK: NEGATIVE
KETONES UR: 5 mg/dL — AB
LEUKOCYTES UA: NEGATIVE
NITRITE: NEGATIVE
PH: 6 (ref 5.0–8.0)
Protein, ur: 100 mg/dL — AB
SPECIFIC GRAVITY, URINE: 1.027 (ref 1.005–1.030)

## 2018-03-24 LAB — TROPONIN I: Troponin I: <0.03 ng/mL (ref <0.03)

## 2018-03-24 MED ORDER — HYDROCODONE-ACETAMINOPHEN 5-325 MG PO TABS: 1.0000 | ORAL_TABLET | ORAL | 0 refills | Status: DC | PRN

## 2018-03-24 MED ORDER — CYCLOBENZAPRINE HCL 5 MG PO TABS: 5.0000 mg | ORAL_TABLET | Freq: Three times a day (TID) | ORAL | 0 refills | Status: DC | PRN

## 2018-03-24 NOTE — ED Notes (Signed)
Paper charted on pt. Downtime just ended.

## 2018-03-24 NOTE — Discharge Instructions (Signed)
You have been seen and diagnosed with back pain today in the Emergency Department.  Medicines: Please take the medicine as prescribed. Call your primary healthcare provider if you think your medicine is not working as expected or if you feel you need more. I do not generally provide refills on pain medications in the ED because I dont monitor you on a long-term basis. Your primary care doctor does. Do not drive or operate heavy machinery while taking narcotic pain medications.  Self-care: Exercise: Gentle exercise may help decrease your pain. Start with some [place] exercises such as walking, biking, or swimming during the first 2 weeks. Ask for more information about the activities or exercises that are right for you. Maintain a healthy weight.  Ice: Ice helps decrease swelling, pain, and muscle spasm. Put crushed ice in a plastic bag. Cover it with a towel. Place it on your lower back for 20 to 30 minutes every 2 hours until improvement.  Heat: Heat helps decrease pain and muscle spasms. Use a small towel dampened with warm water or a heat pad, or sit in a warm bath. Apply heat on the area for 20 to 30 minutes every 2 hours until improvement. Alternate heat and ice.  Physical therapy: You may need to see a physical therapist to teach you special exercises. These exercises help improve movement and decrease pain. Physical therapy can also help improve strength and decrease your risk for loss of function. Please follow-up with your PCP about physical therapy.  Contact your primary healthcare provider or orthopedist if:  You have a fever.  You have pain at night or when you rest.  Your pain does not get better with treatment.  You have pain that worsens when you cough, sneeze, or strain your back.  You suddenly feel something pop or snap in your back.  You have questions or concerns about your condition or care.  Please return to the emergency department if:  You have severe pain.  You have  sudden stiffness or heaviness to buttocks down to both legs.  You have numbness or weakness in one leg, or pain in both legs.  You have numbness in your genital area or across your lower back.  You cannot control your urine or bowel movements.

## 2018-04-01 LAB — BLOOD GAS, VENOUS
Acid-base deficit: 5.1 mmol/L — ABNORMAL HIGH (ref 0.0–2.0)
Bicarbonate: 19.1 mmol/L — ABNORMAL LOW (ref 20.0–28.0)
PATIENT TEMPERATURE: 37
pCO2, Ven: 33 mmHg — ABNORMAL LOW (ref 44.0–60.0)
pH, Ven: 7.37 (ref 7.250–7.430)

## 2018-06-02 ENCOUNTER — Encounter: Payer: Self-pay | Admitting: Emergency Medicine

## 2018-06-02 ENCOUNTER — Emergency Department: Payer: Medicare Other

## 2018-06-02 ENCOUNTER — Other Ambulatory Visit: Payer: Self-pay

## 2018-06-02 ENCOUNTER — Emergency Department
Admission: EM | Admit: 2018-06-02 | Discharge: 2018-06-02 | Disposition: A | Discharge status: Home / Self Care | Payer: Medicare Other | Attending: Emergency Medicine | Admitting: Emergency Medicine

## 2018-06-02 DIAGNOSIS — W19XXXA Unspecified fall, initial encounter: Secondary | ICD-10-CM

## 2018-06-02 DIAGNOSIS — Z794 Long term (current) use of insulin: Secondary | ICD-10-CM | POA: Insufficient documentation

## 2018-06-02 DIAGNOSIS — Z7901 Long term (current) use of anticoagulants: Secondary | ICD-10-CM | POA: Diagnosis not present

## 2018-06-02 DIAGNOSIS — I5032 Chronic diastolic (congestive) heart failure: Secondary | ICD-10-CM | POA: Diagnosis not present

## 2018-06-02 DIAGNOSIS — W1789XA Other fall from one level to another, initial encounter: Secondary | ICD-10-CM | POA: Insufficient documentation

## 2018-06-02 DIAGNOSIS — S40012A Contusion of left shoulder, initial encounter: Secondary | ICD-10-CM

## 2018-06-02 DIAGNOSIS — Y92018 Other place in single-family (private) house as the place of occurrence of the external cause: Secondary | ICD-10-CM | POA: Diagnosis not present

## 2018-06-02 DIAGNOSIS — Y999 Unspecified external cause status: Secondary | ICD-10-CM | POA: Diagnosis not present

## 2018-06-02 DIAGNOSIS — Y939 Activity, unspecified: Secondary | ICD-10-CM | POA: Insufficient documentation

## 2018-06-02 DIAGNOSIS — E119 Type 2 diabetes mellitus without complications: Secondary | ICD-10-CM | POA: Insufficient documentation

## 2018-06-02 DIAGNOSIS — S0990XA Unspecified injury of head, initial encounter: Secondary | ICD-10-CM | POA: Diagnosis present

## 2018-06-02 DIAGNOSIS — T07XXXA Unspecified multiple injuries, initial encounter: Secondary | ICD-10-CM | POA: Diagnosis not present

## 2018-06-02 DIAGNOSIS — Z7982 Long term (current) use of aspirin: Secondary | ICD-10-CM | POA: Insufficient documentation

## 2018-06-02 DIAGNOSIS — Z87891 Personal history of nicotine dependence: Secondary | ICD-10-CM | POA: Diagnosis not present

## 2018-06-02 DIAGNOSIS — Z23 Encounter for immunization: Secondary | ICD-10-CM | POA: Diagnosis not present

## 2018-06-02 DIAGNOSIS — J449 Chronic obstructive pulmonary disease, unspecified: Secondary | ICD-10-CM | POA: Diagnosis not present

## 2018-06-02 DIAGNOSIS — Z79899 Other long term (current) drug therapy: Secondary | ICD-10-CM | POA: Insufficient documentation

## 2018-06-02 MED ORDER — TETANUS-DIPHTH-ACELL PERTUSSIS 5-2.5-18.5 LF-MCG/0.5 IM SUSP
0.5000 mL | Freq: Once | INTRAMUSCULAR | Status: AC
  Administered 2018-06-02: 0.5 mL via INTRAMUSCULAR
  Filled 2018-06-02: qty 0.5

## 2018-06-02 NOTE — ED Notes (Signed)
Pt states fell off porch and hit back of head last night. Pt states no LOC and no distress at this time. Pt states some pain in head of 2/10 and shoulder 8/10.

## 2018-06-02 NOTE — Discharge Instructions (Signed)
Follow-up with your regular doctor if not better in 3 to 5 days.  Return emergency department worsening.  Follow-up with orthopedics in 1 week for your shoulder.  Wear the sling for comfort.  Tylenol for pain as needed.  Apply ice to any areas that hurt.

## 2018-06-02 NOTE — ED Notes (Signed)
Pt presentation discussed with EDP, Paduchowski.  See new orders.

## 2018-06-02 NOTE — ED Provider Notes (Signed)
[facility] Emergency Department Provider Note  ____________________________________________   First MD Initiated Contact with Patient 06/02/18 2127     (approximate)  I have reviewed the triage vital signs and the nursing notes.   HISTORY  Chief Complaint Head Injury    HPI Larry Terrell is a 57 y.o. male since emergency department after falling off of a porch last night and hitting the back of his head.  He denies any loss of consciousness.  He states he has a small headache but pain is rated at about 2 out of 10.  He states his left shoulder also hurts where he fell on it.  He states he did hurt before he fell but now it is worse.  He has multiple abrasions.  He denies any other injuries.  He is unsure of his last Tdap.    Past Medical History:  Diagnosis Date  . CHF (congestive heart failure) (HCC)    diastolic (echo at [facility] 2992) EF 55-60%  . Cirrhosis ([place])    NASH per records  . COPD (chronic obstructive pulmonary disease) ([place])   . Depression   . Diabetes mellitus with neuropathy ([place])   . Heart attack ([place])   . Neuropathy   . OSA (obstructive sleep apnea)   . Peripheral vascular disease due to secondary diabetes mellitus ([place])   . Splenomegaly   . Temporal giant cell arteritis ([place])   . TIA (transient ischemic attack) April 2016    Patient Active Problem List   Diagnosis Date Noted  . NSTEMI (non-ST elevated myocardial infarction) ([place]) 08/06/2017  . Acute appendicitis 07/10/2015  . S/P laparoscopic appendectomy 07/10/2015  . Appendicitis, acute   . Diabetes mellitus with complication ([place])   . Chronic congestive heart failure with left ventricular diastolic dysfunction ([place])   . Lactic acidosis   . Acute respiratory failure with hypoxia [facility])     Past Surgical History:  Procedure Laterality Date  . AORTA - FEMORAL ARTERY BYPASS GRAFT Bilateral   . CARDIAC CATHETERIZATION    . cardiac stents  July 2015   . CORONARY STENT  INTERVENTION N/A 08/09/2017   Procedure: CORONARY STENT INTERVENTION;  Surgeon: Wellington Hampshire, MD;  Location: [place] CV LAB;  Service: Cardiovascular;  Laterality: N/A;  . LAPAROSCOPIC APPENDECTOMY N/A 07/10/2015   Procedure: APPENDECTOMY LAPAROSCOPIC;  Surgeon: Rolm Bookbinder, MD;  Location: [place];  Service: General;  Laterality: N/A;  . LEFT HEART CATH AND CORONARY ANGIOGRAPHY N/A 08/09/2017   Procedure: LEFT HEART CATH AND CORONARY ANGIOGRAPHY;  Surgeon: Wellington Hampshire, MD;  Location: [place] CV LAB;  Service: Cardiovascular;  Laterality: N/A;    Prior to Admission medications   Medication Sig Start Date End Date Taking? Authorizing Provider  aspirin 81 MG chewable tablet Chew 1 tablet (81 mg total) by mouth daily. 08/11/17   Fritzi Mandes, MD  atorvastatin (LIPITOR) 40 MG tablet Take 40 mg by mouth daily.     [provider]  clopidogrel (PLAVIX) 75 MG tablet Take 1 tablet (75 mg total) by mouth daily with breakfast. 08/11/17   Fritzi Mandes, MD  cyclobenzaprine (FLEXERIL) 5 MG tablet Take 1 tablet (5 mg total) by mouth 3 (three) times daily as needed for muscle spasms. 03/24/18   Merlyn Lot, MD  famotidine (PEPCID) 20 MG tablet Take 1 tablet (20 mg total) by mouth 2 (two) times daily. 12/26/17   Paulette Blanch, MD  HYDROcodone-acetaminophen (NORCO) 5-325 MG tablet Take 1 tablet by mouth  every 4 (four) hours as needed for moderate pain. 03/24/18   Merlyn Lot, MD  insulin NPH-regular Human (NOVOLIN 70/30) (70-30) 100 UNIT/ML injection Inject 20-24 Units into the skin 2 (two) times daily. Take 24units in the morning and 20units in the evening.    [provider]  metFORMIN (GLUCOPHAGE) 500 MG tablet Take 500 mg by mouth 2 (two) times daily with a meal.    [provider]  metoprolol (LOPRESSOR) 50 MG tablet Take 50 mg by mouth 2 (two) times daily.    [provider]  nitroGLYCERIN (NITROSTAT) 0.4 MG SL tablet Place 0.4 mg under the  tongue every 5 (five) minutes as needed for chest pain.    [provider]  ondansetron (ZOFRAN ODT) 4 MG disintegrating tablet Take 1 tablet (4 mg total) by mouth every 8 (eight) hours as needed for nausea or vomiting. 11/14/17   Earleen Newport, MD  oxyCODONE-acetaminophen (ROXICET) 5-325 MG tablet Take 1 tablet by mouth every 6 (six) hours as needed for moderate pain or severe pain. Patient not taking: Reported on 08/06/2017 01/14/16   Harvest Dark, MD  predniSONE (DELTASONE) 20 MG tablet 2 tablets daily x 4 days 12/26/17   Paulette Blanch, MD  traMADol (ULTRAM) 50 MG tablet Take 1 tablet (50 mg total) by mouth every 6 (six) hours as needed. Patient taking differently: Take 50 mg by mouth every 6 (six) hours as needed for moderate pain.  05/14/15   Loney Hering, MD  venlafaxine XR (EFFEXOR-XR) 150 MG 24 hr capsule Take 150 mg by mouth daily with breakfast.    [provider]    Allergies Ace inhibitors  Family History  Problem Relation Age of Onset  . Heart attack Mother 29  . Stroke Mother   . Heart attack Father   . Alzheimer's disease Father     Social History Social History   Tobacco Use  . Smoking status: Former Smoker    Types: Cigarettes    Last attempt to quit: 2009    Years since quitting: 10.8  . Smokeless tobacco: Never Used  Substance Use Topics  . Alcohol use: No    Alcohol/week: 0.0 standard drinks  . Drug use: No    Review of Systems  Constitutional: No fever/chills, positive head injury Eyes: No visual changes. ENT: No sore throat. Respiratory: Denies cough Genitourinary: Negative for dysuria. Musculoskeletal: Negative for back pain.  Positive left shoulder pain Skin: Negative for rash.  Positive abrasion    ____________________________________________   PHYSICAL EXAM:  VITAL SIGNS: ED Triage Vitals  Enc Vitals Group     BP 06/02/18 1938 (!) 164/73     Pulse Rate 06/02/18 1938 78     Resp 06/02/18 1938 16     Temp  06/02/18 1938 (!) 97.5 F (36.4 C)     Temp Source 06/02/18 1938 Oral     SpO2 06/02/18 1938 96 %     Weight 06/02/18 1939 198 lb (89.8 kg)     Height 06/02/18 1939 5' 7"  (1.702 m)     Head Circumference --      Peak Flow --      Pain Score 06/02/18 1938 2     Pain Loc --      Pain Edu? --      Excl. in [place]? --     Constitutional: Alert and oriented. Well appearing and in no acute distress. Eyes: Conjunctivae are normal.  Head: Multiple abrasions noted on the scalp.  Small hematoma noted at the occipital area. Nose: No congestion/rhinnorhea. Mouth/Throat: Mucous membranes are moist.   Neck:  supple no lymphadenopathy noted Cardiovascular: Normal rate, regular rhythm. Heart sounds are normal Respiratory: Normal respiratory effort.  No retractions, lungs c t a  GU: deferred Musculoskeletal: FROM all extremities, warm and well perfused, left shoulder is mildly tender at the joint.  Neurovascular is intact. Neurologic:  Normal speech and language.  Skin:  Skin is warm, dry and intact. No rash noted. Psychiatric: Mood and affect are normal. Speech and behavior are normal.  ____________________________________________   LABS (all labs ordered are listed, but only abnormal results are displayed)  Labs Reviewed - No data to display ____________________________________________   ____________________________________________  RADIOLOGY  CT of the head and C-spine are negative for fracture or any acute abnormality. X-ray of the left shoulder is negative for fracture  ____________________________________________   PROCEDURES  Procedure(s) performed: Sling was applied by the tech  Procedures    ____________________________________________   INITIAL IMPRESSION / ASSESSMENT AND PLAN / ED COURSE  Pertinent labs & imaging results that were available during my care of the patient were reviewed by me and considered in my medical decision making (see chart for details).     Patient is a 57 year old male presents emergency department falling off porch.  Is complaining of head injury and left shoulder pain.  On physical exam patient appears well.  There is a hematoma and multiple abrasions noted to the back of the scalp.  Left shoulder is tender to palpation.  CT of the head and C-spine were ordered from triage.  They are both negative for any acute abnormality. X-ray of the left shoulder is negative for any acute abnormality  Tdap was given to the patient. Sling was applied on the patient.  Discussed all of the findings with patient.  He is to follow-up with his regular doctor if not better in 2 to 3 days.  Return emergency department worsening.  Take Tylenol for pain as needed.  He states he understands will comply.  Was discharged in stable condition in the care of family member.     As part of my medical decision making, I reviewed the following data within the [place] notes reviewed and incorporated, Old chart reviewed, Radiograph reviewed CT the head and C-spine are negative, x-ray left shoulder is negative, Notes from prior ED visits and [place] Controlled Substance Database  ____________________________________________   FINAL CLINICAL IMPRESSION(S) / ED DIAGNOSES  Final diagnoses:  Fall, initial encounter  Injury of head, initial encounter  Contusion of left shoulder, initial encounter  Abrasions of multiple sites      NEW MEDICATIONS STARTED DURING THIS VISIT:  Discharge Medication List as of 06/02/2018  9:37 PM       Note:  This document was prepared using Dragon voice recognition software and may include unintentional dictation errors.    Versie Starks, PA-C 06/02/18 2307    Harvest Dark, MD 06/02/18 [phone]

## 2018-06-02 NOTE — ED Triage Notes (Signed)
Pt in via POV, reports mechanical fall last night, falling off porch, hitting head on ground.  Pt reports headaches and left shoulder pain since the fall.  Pt denies LOC, does report taking Plavix.  Vitals WDL.  NAD noted at this time.

## 2018-11-19 ENCOUNTER — Encounter: Payer: Self-pay | Admitting: Emergency Medicine

## 2018-11-19 ENCOUNTER — Emergency Department: Payer: Medicare Other

## 2018-11-19 ENCOUNTER — Emergency Department
Admission: EM | Admit: 2018-11-19 | Discharge: 2018-11-20 | Disposition: A | Discharge status: Home / Self Care | Payer: Medicare Other | Attending: Emergency Medicine | Admitting: Emergency Medicine

## 2018-11-19 ENCOUNTER — Other Ambulatory Visit: Payer: Self-pay

## 2018-11-19 DIAGNOSIS — R531 Weakness: Secondary | ICD-10-CM | POA: Insufficient documentation

## 2018-11-19 DIAGNOSIS — I252 Old myocardial infarction: Secondary | ICD-10-CM | POA: Insufficient documentation

## 2018-11-19 DIAGNOSIS — J449 Chronic obstructive pulmonary disease, unspecified: Secondary | ICD-10-CM | POA: Insufficient documentation

## 2018-11-19 DIAGNOSIS — I509 Heart failure, unspecified: Secondary | ICD-10-CM | POA: Insufficient documentation

## 2018-11-19 DIAGNOSIS — F329 Major depressive disorder, single episode, unspecified: Secondary | ICD-10-CM | POA: Insufficient documentation

## 2018-11-19 DIAGNOSIS — Z8673 Personal history of transient ischemic attack (TIA), and cerebral infarction without residual deficits: Secondary | ICD-10-CM | POA: Insufficient documentation

## 2018-11-19 DIAGNOSIS — E119 Type 2 diabetes mellitus without complications: Secondary | ICD-10-CM | POA: Insufficient documentation

## 2018-11-19 DIAGNOSIS — Z87891 Personal history of nicotine dependence: Secondary | ICD-10-CM | POA: Insufficient documentation

## 2018-11-19 DIAGNOSIS — R4781 Slurred speech: Secondary | ICD-10-CM | POA: Diagnosis present

## 2018-11-19 LAB — COMPREHENSIVE METABOLIC PANEL
ALT: 46 U/L — ABNORMAL HIGH (ref 0–44)
AST: 39 U/L (ref 15–41)
Albumin: 4.1 g/dL (ref 3.5–5.0)
Alkaline Phosphatase: 89 U/L (ref 38–126)
Anion gap: 9 (ref 5–15)
BUN: 20 mg/dL (ref 6–20)
CO2: 22 mmol/L (ref 22–32)
Calcium: 10.3 mg/dL (ref 8.9–10.3)
Chloride: 105 mmol/L (ref 98–111)
Creatinine, Ser: 1.26 mg/dL — ABNORMAL HIGH (ref 0.61–1.24)
GFR calc Af Amer: >60 mL/min (ref >60)
GFR calc non Af Amer: >60 mL/min (ref >60)
Glucose, Bld: 270 mg/dL — ABNORMAL HIGH (ref 70–99)
Potassium: 4.6 mmol/L (ref 3.5–5.1)
Sodium: 136 mmol/L (ref 135–145)
Total Bilirubin: 1.4 mg/dL — ABNORMAL HIGH (ref 0.3–1.2)
Total Protein: 7.1 g/dL (ref 6.5–8.1)

## 2018-11-19 LAB — CBC
HCT: 39.3 % (ref 39.0–52.0)
Hemoglobin: 14.1 g/dL (ref 13.0–17.0)
MCH: 32.9 pg (ref 26.0–34.0)
MCHC: 35.9 g/dL (ref 30.0–36.0)
MCV: 91.8 fL (ref 80.0–100.0)
Platelets: 148 10*3/uL — ABNORMAL LOW (ref 150–400)
RBC: 4.28 MIL/uL (ref 4.22–5.81)
RDW: 14.7 % (ref 11.5–15.5)
WBC: 7.3 10*3/uL (ref 4.0–10.5)
nRBC: 0 % (ref 0.0–0.2)

## 2018-11-19 LAB — DIFFERENTIAL
Abs Immature Granulocytes: 0.02 10*3/uL (ref 0.00–0.07)
Basophils Absolute: 0.1 10*3/uL (ref 0.0–0.1)
Basophils Relative: 1 %
Eosinophils Absolute: 0.5 10*3/uL (ref 0.0–0.5)
Eosinophils Relative: 7 %
Immature Granulocytes: 0 %
Lymphocytes Relative: 21 %
Lymphs Abs: 1.5 10*3/uL (ref 0.7–4.0)
Monocytes Absolute: 0.5 10*3/uL (ref 0.1–1.0)
Monocytes Relative: 7 %
Neutro Abs: 4.7 10*3/uL (ref 1.7–7.7)
Neutrophils Relative %: 64 %

## 2018-11-19 LAB — PROTIME-INR
INR: 1 (ref 0.8–1.2)
Prothrombin Time: 13.2 seconds (ref 11.4–15.2)

## 2018-11-19 LAB — APTT: aPTT: 33 seconds (ref 24–36)

## 2018-11-19 LAB — GLUCOSE, CAPILLARY: Glucose-Capillary: 257 mg/dL — ABNORMAL HIGH (ref 70–99)

## 2018-11-19 NOTE — ED Triage Notes (Signed)
Pt presents to ED c/o slurred speech that he noticed after waking up around 0345 this morning. LKW 2300 last night prior to going to bed. Pt also reports difficulty holding onto objects bilaterally. No focal weakness or sensory deficit noted at this time.

## 2018-11-19 NOTE — ED Provider Notes (Signed)
[facility] Emergency Department Provider Note       Time seen: ----------------------------------------- 8:38 PM on 11/19/2018 -----------------------------------------   I have reviewed the triage vital signs and the nursing notes.  HISTORY   Chief Complaint Cerebrovascular Accident    HPI Larry Terrell is a 58 y.o. male with a history of CHF, cirrhosis, COPD, depression, MI, splenomegaly, giant cell arteritis, TIA who presents to the ED for slurred speech that he noticed after waking up around 345 this afternoon.  His last known well was last night prior to going to bed.  Past Medical History:  Diagnosis Date  . CHF (congestive heart failure) (HCC)    diastolic (echo at [facility] 0258) EF 55-60%  . Cirrhosis ([place])    NASH per records  . COPD (chronic obstructive pulmonary disease) ([place])   . Depression   . Diabetes mellitus with neuropathy ([place])   . Heart attack ([place])   . Neuropathy   . OSA (obstructive sleep apnea)   . Peripheral vascular disease due to secondary diabetes mellitus ([place])   . Splenomegaly   . Temporal giant cell arteritis ([place])   . TIA (transient ischemic attack) April 2016    Patient Active Problem List   Diagnosis Date Noted  . NSTEMI (non-ST elevated myocardial infarction) ([place]) 08/06/2017  . Acute appendicitis 07/10/2015  . S/P laparoscopic appendectomy 07/10/2015  . Appendicitis, acute   . Diabetes mellitus with complication ([place])   . Chronic congestive heart failure with left ventricular diastolic dysfunction ([place])   . Lactic acidosis   . Acute respiratory failure with hypoxia [facility])     Past Surgical History:  Procedure Laterality Date  . AORTA - FEMORAL ARTERY BYPASS GRAFT Bilateral   . CARDIAC CATHETERIZATION    . cardiac stents  July 2015   . CORONARY STENT INTERVENTION N/A 08/09/2017   Procedure: CORONARY STENT INTERVENTION;  Surgeon: Wellington Hampshire, MD;  Location: [place] [facility] LAB;  Service:  Cardiovascular;  Laterality: N/A;  . LAPAROSCOPIC APPENDECTOMY N/A 07/10/2015   Procedure: APPENDECTOMY LAPAROSCOPIC;  Surgeon: Rolm Bookbinder, MD;  Location: [place];  Service: General;  Laterality: N/A;  . LEFT HEART CATH AND CORONARY ANGIOGRAPHY N/A 08/09/2017   Procedure: LEFT HEART CATH AND CORONARY ANGIOGRAPHY;  Surgeon: Wellington Hampshire, MD;  Location: [place] CV [facility];  Service: Cardiovascular;  Laterality: N/A;    Allergies Ace inhibitors  Social History Social History   Tobacco Use  . Smoking status: Former Smoker    Types: Cigarettes    Last attempt to quit: 2009    Years since quitting: 11.3  . Smokeless tobacco: Never Used  Substance Use Topics  . Alcohol use: No    Alcohol/week: 0.0 standard drinks  . Drug use: No    Review of Systems Constitutional: Negative for fever. Cardiovascular: Negative for chest pain. Respiratory: Negative for shortness of breath. Gastrointestinal: Negative for abdominal pain, vomiting and diarrhea. Musculoskeletal: Negative for back pain. Skin: Negative for rash. Neurological: Positive for speech disturbance, difficulty with intrinsic hand muscle use  All systems negative/normal/unremarkable except as stated in the HPI  ____________________________________________   PHYSICAL EXAM:  VITAL SIGNS: ED Triage Vitals  Enc Vitals Group     BP 11/19/18 1723 116/87     Pulse Rate 11/19/18 1723 79     Resp 11/19/18 1723 18     Temp 11/19/18 1723 98 F (36.7 C)     Temp Source 11/19/18 1723 Oral     SpO2 11/19/18 1723  93 %     Weight 11/19/18 1724 200 lb (90.7 kg)     Height 11/19/18 1724 5' 7"  (1.702 m)     Head Circumference --      Peak Flow --      Pain Score 11/19/18 1724 0     Pain Loc --      Pain Edu? --      Excl. in [place]? --    Constitutional: Alert and oriented. Well appearing and in no distress. Eyes: Conjunctivae are normal. Normal extraocular movements. ENT      Head: Normocephalic and atraumatic.       Nose: No congestion/rhinnorhea.      Mouth/Throat: Mucous membranes are moist.      Neck: No stridor. Cardiovascular: Normal rate, regular rhythm. No murmurs, rubs, or gallops. Respiratory: Normal respiratory effort without tachypnea nor retractions. Breath sounds are clear and equal bilaterally. No wheezes/rales/rhonchi. Gastrointestinal: Soft and nontender. Normal bowel sounds Musculoskeletal: Nontender with normal range of motion in extremities. No lower extremity tenderness nor edema. Neurologic: Patient has a speech impediment but otherwise has normal speech and language.  No gross focal neurologic deficits are appreciated.  Skin:  Skin is warm, dry and intact. No rash noted. Psychiatric: Mood and affect are normal.  Behavior is normal ____________________________________________  EKG: Interpreted by me.  Sinus rhythm rate of 87 bpm, prolonged PR interval, normal QT, left axis deviation  ____________________________________________  ED COURSE:  As part of my medical decision making, I reviewed the following data within the [place] History obtained from family if available, nursing notes, old chart and ekg, as well as notes from prior ED visits. Patient presented for difficulty speaking and difficulty holding things in his hands, we will assess with labs and imaging as indicated at this time.   Procedures  Larry Terrell was evaluated in Emergency Department on 11/19/2018 for the symptoms described in the history of present illness. He was evaluated in the context of the global COVID-19 pandemic, which necessitated consideration that the patient might be at risk for infection with the SARS-CoV-2 virus that causes COVID-19. Institutional protocols and algorithms that pertain to the evaluation of patients at risk for COVID-19 are in a state of rapid change based on information released by regulatory bodies including the CDC and federal and state organizations. These  policies and algorithms were followed during the patient's care in the ED.  ____________________________________________   LABS (pertinent positives/negatives)  Labs Reviewed  CBC - Abnormal; Notable for the following components:      Result Value   Platelets 148 (*)    All other components within normal limits  COMPREHENSIVE METABOLIC PANEL - Abnormal; Notable for the following components:   Glucose, Bld 270 (*)    Creatinine, Ser 1.26 (*)    ALT 46 (*)    Total Bilirubin 1.4 (*)    All other components within normal limits  GLUCOSE, CAPILLARY - Abnormal; Notable for the following components:   Glucose-Capillary 257 (*)    All other components within normal limits  PROTIME-INR  APTT  DIFFERENTIAL  CBG MONITORING, ED    RADIOLOGY  CT head is unremarkable  ____________________________________________   DIFFERENTIAL DIAGNOSIS   Medication side effect, CVA, TIA, focal seizure  FINAL ASSESSMENT AND PLAN  Speech disturbance, weakness   Plan: The patient had presented for speech disturbance and weakness in his hands. Patient's labs are reassuring. Patient's imaging not reveal any acute process.  Patient has a history  of some type of spells with episodic ataxia.  His work-up here has been unremarkable, he has no neurologic deficits at this time.  He certainly could be overmedicated, have advised talking to his doctor about his gabapentin dose in conjunction with his oxycodone.  He is cleared for outpatient follow-up.   Laurence Aly, MD    Note: This note was generated in part or whole with voice recognition software. Voice recognition is usually quite accurate but there are transcription errors that can and very often do occur. I apologize for any typographical errors that were not detected and corrected.     Earleen Newport, MD 11/19/18 2102

## 2019-02-11 ENCOUNTER — Other Ambulatory Visit: Payer: Self-pay

## 2019-02-11 ENCOUNTER — Emergency Department
Admission: EM | Admit: 2019-02-11 | Discharge: 2019-02-12 | Disposition: A | Discharge status: Home / Self Care | Payer: Medicare Other | Attending: Emergency Medicine | Admitting: Emergency Medicine

## 2019-02-11 DIAGNOSIS — I5032 Chronic diastolic (congestive) heart failure: Secondary | ICD-10-CM | POA: Insufficient documentation

## 2019-02-11 DIAGNOSIS — E1151 Type 2 diabetes mellitus with diabetic peripheral angiopathy without gangrene: Secondary | ICD-10-CM | POA: Insufficient documentation

## 2019-02-11 DIAGNOSIS — M79672 Pain in left foot: Secondary | ICD-10-CM | POA: Diagnosis not present

## 2019-02-11 DIAGNOSIS — G8929 Other chronic pain: Secondary | ICD-10-CM

## 2019-02-11 DIAGNOSIS — R197 Diarrhea, unspecified: Secondary | ICD-10-CM | POA: Diagnosis not present

## 2019-02-11 DIAGNOSIS — E1140 Type 2 diabetes mellitus with diabetic neuropathy, unspecified: Secondary | ICD-10-CM | POA: Insufficient documentation

## 2019-02-11 DIAGNOSIS — M79671 Pain in right foot: Secondary | ICD-10-CM | POA: Diagnosis present

## 2019-02-11 DIAGNOSIS — Z794 Long term (current) use of insulin: Secondary | ICD-10-CM | POA: Diagnosis not present

## 2019-02-11 DIAGNOSIS — Z87891 Personal history of nicotine dependence: Secondary | ICD-10-CM | POA: Insufficient documentation

## 2019-02-11 DIAGNOSIS — Z7982 Long term (current) use of aspirin: Secondary | ICD-10-CM | POA: Insufficient documentation

## 2019-02-11 DIAGNOSIS — I252 Old myocardial infarction: Secondary | ICD-10-CM | POA: Diagnosis not present

## 2019-02-11 DIAGNOSIS — J449 Chronic obstructive pulmonary disease, unspecified: Secondary | ICD-10-CM | POA: Insufficient documentation

## 2019-02-11 DIAGNOSIS — Z79899 Other long term (current) drug therapy: Secondary | ICD-10-CM | POA: Insufficient documentation

## 2019-02-11 DIAGNOSIS — K7460 Unspecified cirrhosis of liver: Secondary | ICD-10-CM | POA: Diagnosis not present

## 2019-02-11 LAB — COMPREHENSIVE METABOLIC PANEL
ALT: 40 U/L (ref 0–44)
AST: 39 U/L (ref 15–41)
Albumin: 4 g/dL (ref 3.5–5.0)
Alkaline Phosphatase: 87 U/L (ref 38–126)
Anion gap: 9 (ref 5–15)
BUN: 20 mg/dL (ref 6–20)
CO2: 23 mmol/L (ref 22–32)
Calcium: 9.5 mg/dL (ref 8.9–10.3)
Chloride: 107 mmol/L (ref 98–111)
Creatinine, Ser: 1.24 mg/dL (ref 0.61–1.24)
GFR calc Af Amer: >60 mL/min (ref >60)
GFR calc non Af Amer: >60 mL/min (ref >60)
Glucose, Bld: 200 mg/dL — ABNORMAL HIGH (ref 70–99)
Potassium: 4.1 mmol/L (ref 3.5–5.1)
Sodium: 139 mmol/L (ref 135–145)
Total Bilirubin: 1.1 mg/dL (ref 0.3–1.2)
Total Protein: 7.4 g/dL (ref 6.5–8.1)

## 2019-02-11 LAB — CBC
HCT: 40.6 % (ref 39.0–52.0)
Hemoglobin: 14.2 g/dL (ref 13.0–17.0)
MCH: 33.6 pg (ref 26.0–34.0)
MCHC: 35 g/dL (ref 30.0–36.0)
MCV: 96 fL (ref 80.0–100.0)
Platelets: 164 10*3/uL (ref 150–400)
RBC: 4.23 MIL/uL (ref 4.22–5.81)
RDW: 15.1 % (ref 11.5–15.5)
WBC: 6.3 10*3/uL (ref 4.0–10.5)
nRBC: 0 % (ref 0.0–0.2)

## 2019-02-11 LAB — LIPASE, BLOOD: Lipase: 80 U/L — ABNORMAL HIGH (ref 11–51)

## 2019-02-11 MED ORDER — OXYCODONE-ACETAMINOPHEN 5-325 MG PO TABS
2.0000 | ORAL_TABLET | Freq: Once | ORAL | Status: AC
  Administered 2019-02-11: 2 via ORAL
  Filled 2019-02-11: qty 2

## 2019-02-11 MED ORDER — CALCIUM POLYCARBOPHIL 625 MG PO TABS: 1250.0000 mg | ORAL_TABLET | Freq: Every day | ORAL | 0 refills | Status: AC

## 2019-02-11 MED ORDER — OXYCODONE HCL 5 MG PO TABS: 5.0000 mg | ORAL_TABLET | Freq: Four times a day (QID) | ORAL | 0 refills | Status: DC | PRN

## 2019-02-11 NOTE — ED Triage Notes (Signed)
C/o diarrhea x 1 month, denies NV or abdominal pain Bilateral foot pain with walking X years, worsening over the last week.

## 2019-02-11 NOTE — Discharge Instructions (Addendum)
I understand [facility] cannot fill the prior prescription because it is written to be almost an exact duplicate of your previous prescription that was filled less than 30 days ago.  I have written you another prescription for oxycodone 5 mg 1-2 every 4-6 hours for breakthrough pain but I can only give you enough to last until the 11th when your next prescription should be due from your primary care doctor or if you get more from the pain clinic you can have that done that way.  Please be sure to let them know that you did get this prescription from me today.  The government is keeping very close track of narcotics now.  We can get in trouble if you do not tell them about all of your prescriptions.

## 2019-02-11 NOTE — ED Provider Notes (Signed)
[facility] Emergency Department Provider Note  ____________________________________________   First MD Initiated Contact with Patient 02/11/19 2224     (approximate)  I have reviewed the triage vital signs and the nursing notes.   HISTORY  Chief Complaint Foot Pain and Diarrhea    HPI Larry Terrell is a 58 y.o. male  With PMHx as below here with multiple issues. He states his primary complaint is diarrhea. He has had this for at least the lat 2 months. He states he has intermittent loose bowel movements but has not had any abdominal pain. No fever, chills. No travel outside of the [place]. He has seen his PCP for this multiple times per his report and was told to take fiber, but has not been taking any. He is due for a colonoscopy and has stool studies ordered as well. No recent ABX use. No blood in the diarrhea, just loose stool. He denies any pain currently, has not had any diarrhea since this AM. He also c/o chronic b/l foot pain which is his major complaint now. He has chronic neuropathic pain and has run out of his meds, has an appt on Monday for this. He think sit's from moving around the house more than usual. No new pain. No weakness or numbness.      Past Medical History:  Diagnosis Date   CHF (congestive heart failure) (HCC)    diastolic (echo at [facility] 4268) EF 55-60%   Cirrhosis (HCC)    NASH per records   COPD (chronic obstructive pulmonary disease) ([place])    Depression    Diabetes mellitus with neuropathy ([place])    Heart attack ([place])    Neuropathy    OSA (obstructive sleep apnea)    Peripheral vascular disease due to secondary diabetes mellitus ([place])    Splenomegaly    Temporal giant cell arteritis ([place])    TIA (transient ischemic attack) April 2016    Patient Active Problem List   Diagnosis Date Noted   NSTEMI (non-ST elevated myocardial infarction) ([place]) 08/06/2017   Acute appendicitis 07/10/2015   S/P laparoscopic  appendectomy 07/10/2015   Appendicitis, acute    Diabetes mellitus with complication (HCC)    Chronic congestive heart failure with left ventricular diastolic dysfunction (HCC)    Lactic acidosis    Acute respiratory failure with hypoxia [facility])     Past Surgical History:  Procedure Laterality Date   AORTA - FEMORAL ARTERY BYPASS GRAFT Bilateral    CARDIAC CATHETERIZATION     cardiac stents  July 2015    CORONARY STENT INTERVENTION N/A 08/09/2017   Procedure: CORONARY STENT INTERVENTION;  Surgeon: Wellington Hampshire, MD;  Location: [place] [facility];  Service: Cardiovascular;  Laterality: N/A;   LAPAROSCOPIC APPENDECTOMY N/A 07/10/2015   Procedure: APPENDECTOMY LAPAROSCOPIC;  Surgeon: Rolm Bookbinder, MD;  Location: [place];  Service: General;  Laterality: N/A;   LEFT HEART CATH AND CORONARY ANGIOGRAPHY N/A 08/09/2017   Procedure: LEFT HEART CATH AND CORONARY ANGIOGRAPHY;  Surgeon: Wellington Hampshire, MD;  Location: [place] [facility];  Service: Cardiovascular;  Laterality: N/A;    Prior to Admission medications   Medication Sig Start Date End Date Taking? Authorizing Provider  aspirin 81 MG chewable tablet Chew 1 tablet (81 mg total) by mouth daily. 08/11/17   Fritzi Mandes, MD  atorvastatin (LIPITOR) 40 MG tablet Take 40 mg by mouth daily.     [provider]  clopidogrel (PLAVIX) 75 MG tablet Take 1 tablet (75  mg total) by mouth daily with breakfast. 08/11/17   Fritzi Mandes, MD  cyclobenzaprine (FLEXERIL) 5 MG tablet Take 1 tablet (5 mg total) by mouth 3 (three) times daily as needed for muscle spasms. 03/24/18   Merlyn Lot, MD  famotidine (PEPCID) 20 MG tablet Take 1 tablet (20 mg total) by mouth 2 (two) times daily. 12/26/17   Paulette Blanch, MD  HYDROcodone-acetaminophen (NORCO) 5-325 MG tablet Take 1 tablet by mouth every 4 (four) hours as needed for moderate pain. 03/24/18   Merlyn Lot, MD  insulin NPH-regular Human (NOVOLIN 70/30) (70-30) 100 UNIT/ML  injection Inject 20-24 Units into the skin 2 (two) times daily. Take 24units in the morning and 20units in the evening.    [provider]  metFORMIN (GLUCOPHAGE) 500 MG tablet Take 500 mg by mouth 2 (two) times daily with a meal.    [provider]  metoprolol (LOPRESSOR) 50 MG tablet Take 50 mg by mouth 2 (two) times daily.    [provider]  nitroGLYCERIN (NITROSTAT) 0.4 MG SL tablet Place 0.4 mg under the tongue every 5 (five) minutes as needed for chest pain.    [provider]  ondansetron (ZOFRAN ODT) 4 MG disintegrating tablet Take 1 tablet (4 mg total) by mouth every 8 (eight) hours as needed for nausea or vomiting. 11/14/17   Earleen Newport, MD  oxyCODONE (ROXICODONE) 5 MG immediate release tablet Take 1 tablet (5 mg total) by mouth every 6 (six) hours as needed for severe pain. 02/11/19 02/11/20  Duffy Bruce, MD  oxyCODONE-acetaminophen (ROXICET) 5-325 MG tablet Take 1 tablet by mouth every 6 (six) hours as needed for moderate pain or severe pain. Patient not taking: Reported on 08/06/2017 01/14/16   Harvest Dark, MD  polycarbophil (FIBERCON) 625 MG tablet Take 2 tablets (1,250 mg total) by mouth daily for 20 days. 02/11/19 03/03/19  Duffy Bruce, MD  predniSONE (DELTASONE) 20 MG tablet 2 tablets daily x 4 days 12/26/17   Paulette Blanch, MD  traMADol (ULTRAM) 50 MG tablet Take 1 tablet (50 mg total) by mouth every 6 (six) hours as needed. Patient taking differently: Take 50 mg by mouth every 6 (six) hours as needed for moderate pain.  05/14/15   Loney Hering, MD  venlafaxine XR (EFFEXOR-XR) 150 MG 24 hr capsule Take 150 mg by mouth daily with breakfast.    [provider]    Allergies Ace inhibitors  Family History  Problem Relation Age of Onset   Heart attack Mother 32   Stroke Mother    Heart attack Father    Alzheimer's disease Father     Social History Social History   Tobacco Use   Smoking status: Former Smoker      Types: Cigarettes    Quit date: 2009    Years since quitting: 11.5   Smokeless tobacco: Never Used  Substance Use Topics   Alcohol use: No    Alcohol/week: 0.0 standard drinks   Drug use: No    Review of Systems  Review of Systems  Constitutional: Positive for fatigue. Negative for chills and fever.  HENT: Negative for sore throat.   Respiratory: Negative for shortness of breath.   Cardiovascular: Negative for chest pain.  Gastrointestinal: Positive for diarrhea. Negative for abdominal pain.  Genitourinary: Negative for flank pain.  Musculoskeletal: Positive for arthralgias. Negative for neck pain.  Skin: Negative for rash and wound.  Allergic/Immunologic: Negative for immunocompromised state.  Neurological: Positive for weakness. Negative for  numbness.  Hematological: Does not bruise/bleed easily.  All other systems reviewed and are negative.    ____________________________________________  PHYSICAL EXAM:      VITAL SIGNS: ED Triage Vitals [02/11/19 1616]  Enc Vitals Group     BP (!) 201/89     Pulse Rate 93     Resp 18     Temp 98.3 F (36.8 C)     Temp Source Oral     SpO2 100 %     Weight 200 lb (90.7 kg)     Height 5' 7"  (1.702 m)     Head Circumference      Peak Flow      Pain Score 8     Pain Loc      Pain Edu?      Excl. in [place]?      Physical Exam Vitals signs and nursing note reviewed.  Constitutional:      General: He is not in acute distress.    Appearance: He is well-developed.  HENT:     Head: Normocephalic and atraumatic.  Eyes:     Conjunctiva/sclera: Conjunctivae normal.  Neck:     Musculoskeletal: Neck supple.  Cardiovascular:     Rate and Rhythm: Normal rate and regular rhythm.     Heart sounds: Normal heart sounds. No murmur. No friction rub.  Pulmonary:     Effort: Pulmonary effort is normal. No respiratory distress.     Breath sounds: Normal breath sounds. No wheezing or rales.  Abdominal:     General: There is no  distension.     Palpations: Abdomen is soft.     Tenderness: There is no abdominal tenderness.  Skin:    General: Skin is warm.     Capillary Refill: Capillary refill takes less than 2 seconds.  Neurological:     Mental Status: He is alert and oriented to person, place, and time.     Motor: No abnormal muscle tone.       ____________________________________________   LABS (all labs ordered are listed, but only abnormal results are displayed)  Labs Reviewed  LIPASE, BLOOD - Abnormal; Notable for the following components:      Result Value   Lipase 80 (*)    All other components within normal limits  COMPREHENSIVE METABOLIC PANEL - Abnormal; Notable for the following components:   Glucose, Bld 200 (*)    All other components within normal limits  CBC    ____________________________________________  EKG: None ________________________________________  RADIOLOGY All imaging, including plain films, CT scans, and ultrasounds, independently reviewed by me, and interpretations confirmed via formal radiology reads.  ED MD interpretation:   None  Official radiology report(s): No results found.  ____________________________________________  PROCEDURES   Procedure(s) performed (including Critical Care):  Procedures  ____________________________________________  INITIAL IMPRESSION / MDM / [place] / ED COURSE  As part of my medical decision making, I reviewed the following data within the electronic MEDICAL RECORD NUMBER Notes from prior ED visits and [place] Controlled Substance Database      *Larry Terrell was evaluated in Emergency Department on 02/12/2019 for the symptoms described in the history of present illness. He was evaluated in the context of the global COVID-19 pandemic, which necessitated consideration that the patient might be at risk for infection with the SARS-CoV-2 virus that causes COVID-19. Institutional protocols and algorithms that pertain to the  evaluation of patients at risk for COVID-19 are in a state of rapid change based on  information released by regulatory bodies including the CDC and federal and state organizations. These policies and algorithms were followed during the patient's care in the ED.  Some ED evaluations and interventions may be delayed as a result of limited staffing during the pandemic.*      Medical Decision Making: 58 yo F here with diarrhea x months, chronic foot pain.  Re: diarrhea - abd soft, NT, ND, no pain. Labs very reassuring. No signs of infectious etiology. No recent travel. He has stool studies ordered and declines imaging/further work-up at this time, which I think is reasonable. He is on chronic opiates so could be related to OIC, also consideration of IBS as it has worsened w/ stressors.   Re: foot pain - chronic, will give brief doses for tomorrow until he can f/u with PCP. Distal NVI. No signs of acute infection.  ____________________________________________  FINAL CLINICAL IMPRESSION(S) / ED DIAGNOSES  Final diagnoses:  Chronic foot pain, unspecified laterality  Diarrhea, unspecified type     MEDICATIONS GIVEN DURING THIS VISIT:  Medications  oxyCODONE-acetaminophen (PERCOCET/ROXICET) 5-325 MG per tablet 2 tablet (2 tablets Oral Given 02/11/19 2349)     ED Discharge Orders         Ordered    oxyCODONE (ROXICODONE) 5 MG immediate release tablet  Every 6 hours PRN     02/11/19 2303    polycarbophil (FIBERCON) 625 MG tablet  Daily     02/11/19 2303           Note:  This document was prepared using Dragon voice recognition software and may include unintentional dictation errors.   Duffy Bruce, MD 02/12/19 1105

## 2019-02-11 NOTE — ED Notes (Signed)
Pt is NAD noted at this time.

## 2019-02-12 NOTE — ED Provider Notes (Signed)
Patient comes to the lobby because [facility] will not fill yesterday's prescription.  Apparently it also says 1 4 times daily for breakthrough pain the prescription has to be slightly different.  The patient has been using more oxycodone than usual.  I will give him a prescription for 1-2 4 times a day for breakthrough pain but only #36 which should last him until his next prescription is due.  He also has an appointment with the pain clinic on this coming Tuesday which is in 2 days.  Either way he should be on to get more pain relief either from the pain clinic or his primary care doctor.  I do not feel comfortable giving him a whole months worth of pain medicine.   Nena Polio, MD 02/12/19 1258

## 2019-03-22 ENCOUNTER — Emergency Department: Payer: Medicare Other

## 2019-03-22 ENCOUNTER — Emergency Department
Admission: EM | Admit: 2019-03-22 | Discharge: 2019-03-22 | Disposition: A | Discharge status: Home / Self Care | Payer: Medicare Other | Attending: Emergency Medicine | Admitting: Emergency Medicine

## 2019-03-22 ENCOUNTER — Other Ambulatory Visit: Payer: Self-pay

## 2019-03-22 DIAGNOSIS — R0602 Shortness of breath: Secondary | ICD-10-CM | POA: Diagnosis present

## 2019-03-22 DIAGNOSIS — E119 Type 2 diabetes mellitus without complications: Secondary | ICD-10-CM | POA: Insufficient documentation

## 2019-03-22 DIAGNOSIS — R0600 Dyspnea, unspecified: Secondary | ICD-10-CM | POA: Insufficient documentation

## 2019-03-22 DIAGNOSIS — G8929 Other chronic pain: Secondary | ICD-10-CM | POA: Insufficient documentation

## 2019-03-22 DIAGNOSIS — Z87891 Personal history of nicotine dependence: Secondary | ICD-10-CM | POA: Diagnosis not present

## 2019-03-22 DIAGNOSIS — I5032 Chronic diastolic (congestive) heart failure: Secondary | ICD-10-CM | POA: Insufficient documentation

## 2019-03-22 LAB — CBC WITH DIFFERENTIAL/PLATELET
Abs Immature Granulocytes: 0.01 10*3/uL (ref 0.00–0.07)
Basophils Absolute: 0.1 10*3/uL (ref 0.0–0.1)
Basophils Relative: 1 %
Eosinophils Absolute: 0.4 10*3/uL (ref 0.0–0.5)
Eosinophils Relative: 7 %
HCT: 48.2 % (ref 39.0–52.0)
Hemoglobin: 16.5 g/dL (ref 13.0–17.0)
Immature Granulocytes: 0 %
Lymphocytes Relative: 25 %
Lymphs Abs: 1.3 10*3/uL (ref 0.7–4.0)
MCH: 31.7 pg (ref 26.0–34.0)
MCHC: 34.2 g/dL (ref 30.0–36.0)
MCV: 92.5 fL (ref 80.0–100.0)
Monocytes Absolute: 0.3 10*3/uL (ref 0.1–1.0)
Monocytes Relative: 5 %
Neutro Abs: 3.3 10*3/uL (ref 1.7–7.7)
Neutrophils Relative %: 62 %
Platelets: 158 10*3/uL (ref 150–400)
RBC: 5.21 MIL/uL (ref 4.22–5.81)
RDW: 13.7 % (ref 11.5–15.5)
WBC: 5.2 10*3/uL (ref 4.0–10.5)
nRBC: 0 % (ref 0.0–0.2)

## 2019-03-22 LAB — BASIC METABOLIC PANEL
Anion gap: 13 (ref 5–15)
BUN: 14 mg/dL (ref 6–20)
CO2: 23 mmol/L (ref 22–32)
Calcium: 9.7 mg/dL (ref 8.9–10.3)
Chloride: 102 mmol/L (ref 98–111)
Creatinine, Ser: 1.31 mg/dL — ABNORMAL HIGH (ref 0.61–1.24)
GFR calc Af Amer: >60 mL/min (ref >60)
GFR calc non Af Amer: 59 mL/min — ABNORMAL LOW (ref >60)
Glucose, Bld: 266 mg/dL — ABNORMAL HIGH (ref 70–99)
Potassium: 4 mmol/L (ref 3.5–5.1)
Sodium: 138 mmol/L (ref 135–145)

## 2019-03-22 LAB — TROPONIN I (HIGH SENSITIVITY)
Troponin I (High Sensitivity): 19 ng/L — ABNORMAL HIGH (ref <18)
Troponin I (High Sensitivity): 18 ng/L — ABNORMAL HIGH (ref <18)

## 2019-03-22 LAB — BRAIN NATRIURETIC PEPTIDE: B Natriuretic Peptide: 78 pg/mL (ref 0.0–100.0)

## 2019-03-22 MED ORDER — LORAZEPAM 2 MG/ML IJ SOLN
1.0000 mg | Freq: Once | INTRAMUSCULAR | Status: AC
  Administered 2019-03-22: 1 mg via INTRAVENOUS
  Filled 2019-03-22: qty 1

## 2019-03-22 MED ORDER — METOPROLOL TARTRATE 50 MG PO TABS
50.0000 mg | ORAL_TABLET | Freq: Once | ORAL | Status: AC
  Administered 2019-03-22: 12:00:00 50 mg via ORAL
  Filled 2019-03-22: qty 1

## 2019-03-22 MED ORDER — PREGABALIN 50 MG PO CAPS: 50.0000 mg | ORAL_CAPSULE | Freq: Three times a day (TID) | ORAL | 2 refills | Status: DC

## 2019-03-22 MED ORDER — FUROSEMIDE 10 MG/ML IJ SOLN
40.0000 mg | Freq: Once | INTRAMUSCULAR | Status: AC
  Administered 2019-03-22: 11:00:00 40 mg via INTRAVENOUS
  Filled 2019-03-22: qty 4

## 2019-03-22 MED ORDER — OXYCODONE HCL 5 MG PO TABS
5.0000 mg | ORAL_TABLET | Freq: Once | ORAL | Status: AC
  Administered 2019-03-22: 13:00:00 5 mg via ORAL
  Filled 2019-03-22: qty 1

## 2019-03-22 NOTE — ED Notes (Signed)
Dr. Jimmye Norman aware of pt BP 210/106 and pt continued slurred speech.

## 2019-03-22 NOTE — ED Provider Notes (Signed)
[facility] Emergency Department Provider Note       Time seen: ----------------------------------------- 10:35 AM on 03/22/2019 -----------------------------------------   I have reviewed the triage vital signs and the nursing notes.  HISTORY   Chief Complaint Shortness of Breath    HPI Larry Terrell is a 58 y.o. male with a history of CHF, cirrhosis, COPD, depression, diabetes, MI, peripheral vascular disease who presents to the ED for shortness of breath the past several hours.  Patient complains of bilateral foot pain and swelling.  He has been taking Tylenol for the foot pain, reports he has been short of breath before.  He has not had his medications yet this morning.  Past Medical History:  Diagnosis Date  . CHF (congestive heart failure) (HCC)    diastolic (echo at [facility] 5631) EF 55-60%  . Cirrhosis ([place])    NASH per records  . COPD (chronic obstructive pulmonary disease) ([place])   . Depression   . Diabetes mellitus with neuropathy ([place])   . Heart attack ([place])   . Neuropathy   . OSA (obstructive sleep apnea)   . Peripheral vascular disease due to secondary diabetes mellitus ([place])   . Splenomegaly   . Temporal giant cell arteritis ([place])   . TIA (transient ischemic attack) April 2016    Patient Active Problem List   Diagnosis Date Noted  . NSTEMI (non-ST elevated myocardial infarction) ([place]) 08/06/2017  . Acute appendicitis 07/10/2015  . S/P laparoscopic appendectomy 07/10/2015  . Appendicitis, acute   . Diabetes mellitus with complication ([place])   . Chronic congestive heart failure with left ventricular diastolic dysfunction ([place])   . Lactic acidosis   . Acute respiratory failure with hypoxia [facility])     Past Surgical History:  Procedure Laterality Date  . AORTA - FEMORAL ARTERY BYPASS GRAFT Bilateral   . CARDIAC CATHETERIZATION    . cardiac stents  July 2015   . CORONARY STENT INTERVENTION N/A 08/09/2017   Procedure: CORONARY STENT  INTERVENTION;  Surgeon: Wellington Hampshire, MD;  Location: [place] [facility];  Service: Cardiovascular;  Laterality: N/A;  . LAPAROSCOPIC APPENDECTOMY N/A 07/10/2015   Procedure: APPENDECTOMY LAPAROSCOPIC;  Surgeon: Rolm Bookbinder, MD;  Location: [place];  Service: General;  Laterality: N/A;  . LEFT HEART CATH AND CORONARY ANGIOGRAPHY N/A 08/09/2017   Procedure: LEFT HEART CATH AND CORONARY ANGIOGRAPHY;  Surgeon: Wellington Hampshire, MD;  Location: [place] [facility];  Service: Cardiovascular;  Laterality: N/A;    Allergies Ace inhibitors  Social History Social History   Tobacco Use  . Smoking status: Former Smoker    Types: Cigarettes    Quit date: 2009    Years since quitting: 11.6  . Smokeless tobacco: Never Used  Substance Use Topics  . Alcohol use: No    Alcohol/week: 0.0 standard drinks  . Drug use: No   Review of Systems Constitutional: Negative for fever. Cardiovascular: Negative for chest pain. Respiratory: Positive for shortness of breath Gastrointestinal: Negative for abdominal pain, vomiting and diarrhea. Musculoskeletal: Positive for leg pain and swelling Skin: Negative for rash. Neurological: Negative for headaches, focal weakness or numbness.  All systems negative/normal/unremarkable except as stated in the HPI  ____________________________________________   PHYSICAL EXAM:  VITAL SIGNS: ED Triage Vitals  Enc Vitals Group     BP 03/22/19 0942 (!) 208/92     Pulse Rate 03/22/19 0942 78     Resp 03/22/19 0942 18     Temp 03/22/19 0942 97.6 F (36.4 C)  Temp Source 03/22/19 0942 Oral     SpO2 03/22/19 0942 98 %     Weight 03/22/19 0943 200 lb (90.7 kg)     Height 03/22/19 0943 5' 7"  (1.702 m)     Head Circumference --      Peak Flow --      Pain Score 03/22/19 0942 8     Pain Loc --      Pain Edu? --      Excl. in [place]? --    Constitutional: Alert and oriented.  Anxious, no distress Eyes: Conjunctivae are normal. Normal extraocular  movements. ENT      Head: Normocephalic and atraumatic.      Nose: No congestion/rhinnorhea.      Mouth/Throat: Mucous membranes are moist.      Neck: No stridor. Cardiovascular: Normal rate, regular rhythm. No murmurs, rubs, or gallops. Respiratory: Normal respiratory effort without tachypnea nor retractions. Breath sounds are clear and equal bilaterally. No wheezes/rales/rhonchi. Gastrointestinal: Soft and nontender. Normal bowel sounds Musculoskeletal: Nontender with normal range of motion in extremities. No lower extremity tenderness nor edema. Neurologic:  Normal speech and language. No gross focal neurologic deficits are appreciated.  Skin:  Skin is warm, dry and intact. No rash noted. Psychiatric: Anxious mood and affect ____________________________________________  EKG: Interpreted by me.  Sinus rhythm with first-degree AV block, rate of 75 bpm, left axis deviation, inferior infarct age indeterminate, possible anterior lateral infarct age-indeterminate, normal QT  ____________________________________________  ED COURSE:  As part of my medical decision making, I reviewed the following data within the [place] History obtained from family if available, nursing notes, old chart and ekg, as well as notes from prior ED visits. Patient presented for dyspnea, we will assess with labs and imaging as indicated at this time.   Procedures  Larry Terrell was evaluated in Emergency Department on 03/22/2019 for the symptoms described in the history of present illness. He was evaluated in the context of the global COVID-19 pandemic, which necessitated consideration that the patient might be at risk for infection with the SARS-CoV-2 virus that causes COVID-19. Institutional protocols and algorithms that pertain to the evaluation of patients at risk for COVID-19 are in a state of rapid change based on information released by regulatory bodies including the CDC and federal and  state organizations. These policies and algorithms were followed during the patient's care in the ED.  ____________________________________________   LABS (pertinent positives/negatives)  Labs Reviewed  BASIC METABOLIC PANEL - Abnormal; Notable for the following components:      Result Value   Glucose, Bld 266 (*)    Creatinine, Ser 1.31 (*)    GFR calc non Af Amer 59 (*)    All other components within normal limits  TROPONIN I (HIGH SENSITIVITY) - Abnormal; Notable for the following components:   Troponin I (High Sensitivity) 19 (*)    All other components within normal limits  TROPONIN I (HIGH SENSITIVITY) - Abnormal; Notable for the following components:   Troponin I (High Sensitivity) 18 (*)    All other components within normal limits  CBC WITH DIFFERENTIAL/PLATELET  BRAIN NATRIURETIC PEPTIDE  ACETAMINOPHEN LEVEL    RADIOLOGY Images were viewed by me  Chest x-ray IMPRESSION:  Cardiac enlargement along with peribronchial thickening and  increased interstitial markings suggesting mild CHF with  interstitial edema. No pleural effusions.  ____________________________________________   DIFFERENTIAL DIAGNOSIS   Anxiety, CHF, COPD, pneumonia, COVID-19  FINAL ASSESSMENT AND PLAN  Dyspnea  Plan: The patient had presented for dyspnea. Patient's labs are overall reassuring. Patient's imaging revealed some cardiac enlargement although his last echo did not reveal heart failure.  I did give Lasix IV here.  His dyspnea seems to be better, his main complaint is pain in his legs and he is requesting a refill of pain medicine which I cannot provide him.  I will try Lyrica.  Otherwise he is cleared for outpatient follow-up with his doctor.   Laurence Aly, MD    Note: This note was generated in part or whole with voice recognition software. Voice recognition is usually quite accurate but there are transcription errors that can and very often do occur. I apologize for any  typographical errors that were not detected and corrected.     Earleen Newport, MD 03/22/19 1242

## 2019-03-22 NOTE — ED Triage Notes (Signed)
Pt c/o sudden onset SOB in the past 2 hrs, pt c/o BL feet pain with swelling. Pt is in NAD at present. States he has been taking a lot of tylenol for foot pain, issues for the past 2-3 years.

## 2019-03-26 ENCOUNTER — Other Ambulatory Visit: Payer: Self-pay

## 2019-03-26 ENCOUNTER — Encounter: Payer: Self-pay | Admitting: Physician Assistant

## 2019-03-26 ENCOUNTER — Emergency Department
Admission: EM | Admit: 2019-03-26 | Discharge: 2019-03-26 | Disposition: A | Discharge status: Home / Self Care | Payer: Medicare Other | Attending: Emergency Medicine | Admitting: Emergency Medicine

## 2019-03-26 DIAGNOSIS — Z87891 Personal history of nicotine dependence: Secondary | ICD-10-CM | POA: Diagnosis not present

## 2019-03-26 DIAGNOSIS — M79673 Pain in unspecified foot: Secondary | ICD-10-CM

## 2019-03-26 DIAGNOSIS — Z7982 Long term (current) use of aspirin: Secondary | ICD-10-CM | POA: Diagnosis not present

## 2019-03-26 DIAGNOSIS — M79671 Pain in right foot: Secondary | ICD-10-CM | POA: Diagnosis not present

## 2019-03-26 DIAGNOSIS — G8929 Other chronic pain: Secondary | ICD-10-CM | POA: Diagnosis not present

## 2019-03-26 DIAGNOSIS — J449 Chronic obstructive pulmonary disease, unspecified: Secondary | ICD-10-CM | POA: Insufficient documentation

## 2019-03-26 DIAGNOSIS — Z79899 Other long term (current) drug therapy: Secondary | ICD-10-CM | POA: Diagnosis not present

## 2019-03-26 DIAGNOSIS — M79672 Pain in left foot: Secondary | ICD-10-CM | POA: Insufficient documentation

## 2019-03-26 DIAGNOSIS — Z794 Long term (current) use of insulin: Secondary | ICD-10-CM | POA: Insufficient documentation

## 2019-03-26 DIAGNOSIS — Z8673 Personal history of transient ischemic attack (TIA), and cerebral infarction without residual deficits: Secondary | ICD-10-CM | POA: Diagnosis not present

## 2019-03-26 DIAGNOSIS — I252 Old myocardial infarction: Secondary | ICD-10-CM | POA: Diagnosis not present

## 2019-03-26 DIAGNOSIS — E1140 Type 2 diabetes mellitus with diabetic neuropathy, unspecified: Secondary | ICD-10-CM | POA: Diagnosis not present

## 2019-03-26 LAB — URINE DRUG SCREEN, QUALITATIVE (ARMC ONLY)
Amphetamines, Ur Screen: NOT DETECTED
Barbiturates, Ur Screen: NOT DETECTED
Benzodiazepine, Ur Scrn: NOT DETECTED
Cannabinoid 50 Ng, Ur ~~LOC~~: POSITIVE — AB
Cocaine Metabolite,Ur ~~LOC~~: NOT DETECTED
MDMA (Ecstasy)Ur Screen: NOT DETECTED
Methadone Scn, Ur: NOT DETECTED
Opiate, Ur Screen: NOT DETECTED
Phencyclidine (PCP) Ur S: NOT DETECTED
Tricyclic, Ur Screen: NOT DETECTED

## 2019-03-26 MED ORDER — PREGABALIN 25 MG PO CAPS: 25.0000 mg | ORAL_CAPSULE | Freq: Once | ORAL | Status: DC

## 2019-03-26 MED ORDER — PREGABALIN 50 MG PO CAPS
50.0000 mg | ORAL_CAPSULE | Freq: Once | ORAL | Status: AC
  Administered 2019-03-26: 23:00:00 50 mg via ORAL
  Filled 2019-03-26: qty 1

## 2019-03-26 MED ORDER — OXYCODONE HCL 5 MG PO TABS
5.0000 mg | ORAL_TABLET | Freq: Once | ORAL | Status: AC
  Administered 2019-03-26: 5 mg via ORAL
  Filled 2019-03-26: qty 1

## 2019-03-26 NOTE — ED Provider Notes (Signed)
[facility] Emergency Department Provider Note ____________________________________________  Time seen: 2215  I have reviewed the triage vital signs and the nursing notes.  HISTORY  Chief Complaint  Foot Pain  HPI Larry Terrell is a 58 y.o. male with the below medical history, as well as chronic foot pain/neuropathy, presents for complaints of chronic foot pain bilaterally. He denies recent injury or trauma. He is requesting pain medicine. Review of the controlled substances database reveals the patient received double his monthly dose of oxycodone last month, including an ED visit here. He reports his dosed his last pain pill, and is not eligible for a refill ahead of time. He is without additional complaint. He is not currently taking the recently prescribed pregabalin for his foot pain. According to the patient, he did not pick it up because he thought it was gabapentin, to which he is allergic.   Past Medical History:  Diagnosis Date  . CHF (congestive heart failure) (HCC)    diastolic (echo at [facility] 2229) EF 55-60%  . Cirrhosis ([place])    NASH per records  . COPD (chronic obstructive pulmonary disease) ([place])   . Depression   . Diabetes mellitus with neuropathy ([place])   . Heart attack ([place])   . Neuropathy   . OSA (obstructive sleep apnea)   . Peripheral vascular disease due to secondary diabetes mellitus ([place])   . Splenomegaly   . Temporal giant cell arteritis ([place])   . TIA (transient ischemic attack) April 2016    Patient Active Problem List   Diagnosis Date Noted  . NSTEMI (non-ST elevated myocardial infarction) ([place]) 08/06/2017  . Acute appendicitis 07/10/2015  . S/P laparoscopic appendectomy 07/10/2015  . Appendicitis, acute   . Diabetes mellitus with complication ([place])   . Chronic congestive heart failure with left ventricular diastolic dysfunction ([place])   . Lactic acidosis   . Acute respiratory failure with hypoxia [facility])     Past Surgical  History:  Procedure Laterality Date  . AORTA - FEMORAL ARTERY BYPASS GRAFT Bilateral   . CARDIAC CATHETERIZATION    . cardiac stents  July 2015   . CORONARY STENT INTERVENTION N/A 08/09/2017   Procedure: CORONARY STENT INTERVENTION;  Surgeon: Wellington Hampshire, MD;  Location: [place] [facility];  Service: Cardiovascular;  Laterality: N/A;  . LAPAROSCOPIC APPENDECTOMY N/A 07/10/2015   Procedure: APPENDECTOMY LAPAROSCOPIC;  Surgeon: Rolm Bookbinder, MD;  Location: [place];  Service: General;  Laterality: N/A;  . LEFT HEART CATH AND CORONARY ANGIOGRAPHY N/A 08/09/2017   Procedure: LEFT HEART CATH AND CORONARY ANGIOGRAPHY;  Surgeon: Wellington Hampshire, MD;  Location: [place] [facility];  Service: Cardiovascular;  Laterality: N/A;    Prior to Admission medications   Medication Sig Start Date End Date Taking? Authorizing Provider  aspirin 81 MG chewable tablet Chew 1 tablet (81 mg total) by mouth daily. 08/11/17   Fritzi Mandes, MD  atorvastatin (LIPITOR) 40 MG tablet Take 40 mg by mouth daily.     [provider]  clopidogrel (PLAVIX) 75 MG tablet Take 1 tablet (75 mg total) by mouth daily with breakfast. 08/11/17   Fritzi Mandes, MD  famotidine (PEPCID) 20 MG tablet Take 1 tablet (20 mg total) by mouth 2 (two) times daily. 12/26/17   Paulette Blanch, MD  insulin NPH-regular Human (NOVOLIN 70/30) (70-30) 100 UNIT/ML injection Inject 20-24 Units into the skin 2 (two) times daily. Take 24units in the morning and 20units in the evening.    [provider]  metFORMIN (GLUCOPHAGE) 500 MG tablet Take 500 mg by mouth 2 (two) times daily with a meal.    [provider]  metoprolol (LOPRESSOR) 50 MG tablet Take 50 mg by mouth 2 (two) times daily.    [provider]  nitroGLYCERIN (NITROSTAT) 0.4 MG SL tablet Place 0.4 mg under the tongue every 5 (five) minutes as needed for chest pain.    [provider]  pregabalin (LYRICA) 50 MG capsule Take 1 capsule (50 mg total) by  mouth 3 (three) times daily. 03/22/19 03/21/20  Earleen Newport, MD  traMADol (ULTRAM) 50 MG tablet Take 1 tablet (50 mg total) by mouth every 6 (six) hours as needed. Patient taking differently: Take 50 mg by mouth every 6 (six) hours as needed for moderate pain.  05/14/15   Loney Hering, MD  venlafaxine XR (EFFEXOR-XR) 150 MG 24 hr capsule Take 150 mg by mouth daily with breakfast.    [provider]    Allergies Ace inhibitors  Family History  Problem Relation Age of Onset  . Heart attack Mother 21  . Stroke Mother   . Heart attack Father   . Alzheimer's disease Father     Social History Social History   Tobacco Use  . Smoking status: Former Smoker    Types: Cigarettes    Quit date: 2009    Years since quitting: 11.7  . Smokeless tobacco: Never Used  Substance Use Topics  . Alcohol use: No    Alcohol/week: 0.0 standard drinks  . Drug use: No    Review of Systems  Constitutional: Negative for fever. Eyes: Negative for visual changes. ENT: Negative for sore throat. Cardiovascular: Negative for chest pain. Respiratory: Negative for shortness of breath. Gastrointestinal: Negative for abdominal pain, vomiting and diarrhea. Genitourinary: Negative for dysuria. Musculoskeletal: Negative for back pain. Bilateral foot pain as above. Skin: Negative for rash. Neurological: Negative for headaches, focal weakness or numbness. ____________________________________________  PHYSICAL EXAM:  VITAL SIGNS: ED Triage Vitals  Enc Vitals Group     BP 03/26/19 2112 (!) 180/91     Pulse Rate 03/26/19 2112 68     Resp 03/26/19 2112 16     Temp 03/26/19 2112 98 F (36.7 C)     Temp src --      SpO2 03/26/19 2112 98 %     Weight 03/26/19 2113 200 lb (90.7 kg)     Height 03/26/19 2113 5' 7"  (1.702 m)     Head Circumference --      Peak Flow --      Pain Score 03/26/19 2113 10     Pain Loc --      Pain Edu? --      Excl. in [place]? --     Constitutional: Alert and  oriented. Well appearing and in no distress. Head: Normocephalic and atraumatic. Eyes: Conjunctivae are normal. Normal extraocular movements Cardiovascular: Normal rate, regular rhythm. Normal distal pulses and cap refill.  Respiratory: Normal respiratory effort. No wheezes/rales/rhonchi. Musculoskeletal: Nontender with normal range of motion in all extremities.  Neurologic:  Normal gait without ataxia. Normal speech and language. No gross focal neurologic deficits are appreciated. Skin:  Skin is warm, dry and intact. No rash noted. No cyanosis, clubbing, or edema appreciated.  Psychiatric: Mood and affect are normal. Patient exhibits appropriate insight and judgment. ____________________________________________   LABS (pertinent positives/negatives) Labs Reviewed  URINE DRUG SCREEN, QUALITATIVE ([facility] ONLY) - Abnormal; Notable for the following components:  Result Value   Cannabinoid 50 Ng, Ur [place] POSITIVE (*)    All other components within normal limits  ____________________________________________  PROCEDURES  pregabalin 50 mg PO Oxycodone 5 mg PO Procedures ____________________________________________  INITIAL IMPRESSION / ASSESSMENT AND PLAN / ED COURSE  Patient with ED evaluation of chronic bilateral foot pain.  I had a discussion with the patient in length about his request for pain medicines through the ED.  I also reminded the patient that he had double his normal monthly allowance over the last 30 days, and should not at this point be out of medications.  I cautioned him against doubling his dosing above his prescribed amount.  I also encouraged the patient to fill and start the pregabalin provided for his neuropathic pain as an adjunct to the use of narcotic pain medicines.  Patient verbalized understanding and was agreeable to the plan.  A loading dose of pregabalin is provided in the ED as well as a single dose of the oxycodone.  No prescriptions for narcotics are provided  during this visit.  Patient will follow-up with his primary provider tomorrow as planned.  Cordarrius Magnum Lunde was evaluated in Emergency Department on 03/28/2019 for the symptoms described in the history of present illness. He was evaluated in the context of the global COVID-19 pandemic, which necessitated consideration that the patient might be at risk for infection with the SARS-CoV-2 virus that causes COVID-19. Institutional protocols and algorithms that pertain to the evaluation of patients at risk for COVID-19 are in a state of rapid change based on information released by regulatory bodies including the CDC and federal and state organizations. These policies and algorithms were followed during the patient's care in the ED. ____________________________________________  FINAL CLINICAL IMPRESSION(S) / ED DIAGNOSES  Final diagnoses:  Chronic foot pain, unspecified laterality      Melvenia Needles, PA-C 03/28/19 1247    Lavonia Drafts, MD 04/01/19 0131

## 2019-03-26 NOTE — ED Notes (Signed)
Pt able to stand on side of bed with a steady gait to use urinal, this RN at bedside. Pt back on bed safely.

## 2019-03-26 NOTE — Discharge Instructions (Addendum)
Your exam is normal today. Start the Pregabalin prescribed for your foot pain. Follow-up with your provider for ongoing symptoms.

## 2019-03-26 NOTE — ED Triage Notes (Addendum)
Pt states bilateral foot pain for "years". Pt states over last couple of weeks pain has increased. Pt is able to move feet without difficulty, cms intact to both feet, feet are not swollen. 2+ bilateral pedal pulse noted by palpation. Pt states is going to see primary MD tomorrow for same complaint, but didn't want to wait tonight.

## 2019-04-01 ENCOUNTER — Emergency Department: Payer: Medicare Other

## 2019-04-01 ENCOUNTER — Other Ambulatory Visit: Payer: Self-pay

## 2019-04-01 ENCOUNTER — Inpatient Hospital Stay
Admission: EM | Admit: 2019-04-01 | Discharge: 2019-04-04 | DRG: 069 | Disposition: A | Discharge status: Home Health | Payer: Medicare Other | Attending: Specialist | Admitting: Specialist

## 2019-04-01 DIAGNOSIS — R0902 Hypoxemia: Secondary | ICD-10-CM | POA: Diagnosis present

## 2019-04-01 DIAGNOSIS — Z8249 Family history of ischemic heart disease and other diseases of the circulatory system: Secondary | ICD-10-CM

## 2019-04-01 DIAGNOSIS — E1151 Type 2 diabetes mellitus with diabetic peripheral angiopathy without gangrene: Secondary | ICD-10-CM | POA: Diagnosis present

## 2019-04-01 DIAGNOSIS — R270 Ataxia, unspecified: Secondary | ICD-10-CM

## 2019-04-01 DIAGNOSIS — I110 Hypertensive heart disease with heart failure: Secondary | ICD-10-CM | POA: Diagnosis present

## 2019-04-01 DIAGNOSIS — G459 Transient cerebral ischemic attack, unspecified: Principal | ICD-10-CM | POA: Diagnosis present

## 2019-04-01 DIAGNOSIS — I2510 Atherosclerotic heart disease of native coronary artery without angina pectoris: Secondary | ICD-10-CM | POA: Diagnosis present

## 2019-04-01 DIAGNOSIS — F329 Major depressive disorder, single episode, unspecified: Secondary | ICD-10-CM | POA: Diagnosis present

## 2019-04-01 DIAGNOSIS — N400 Enlarged prostate without lower urinary tract symptoms: Secondary | ICD-10-CM | POA: Diagnosis present

## 2019-04-01 DIAGNOSIS — Z8673 Personal history of transient ischemic attack (TIA), and cerebral infarction without residual deficits: Secondary | ICD-10-CM | POA: Diagnosis not present

## 2019-04-01 DIAGNOSIS — E1140 Type 2 diabetes mellitus with diabetic neuropathy, unspecified: Secondary | ICD-10-CM | POA: Diagnosis present

## 2019-04-01 DIAGNOSIS — I5032 Chronic diastolic (congestive) heart failure: Secondary | ICD-10-CM | POA: Diagnosis present

## 2019-04-01 DIAGNOSIS — I672 Cerebral atherosclerosis: Secondary | ICD-10-CM | POA: Diagnosis present

## 2019-04-01 DIAGNOSIS — Z79899 Other long term (current) drug therapy: Secondary | ICD-10-CM

## 2019-04-01 DIAGNOSIS — R471 Dysarthria and anarthria: Secondary | ICD-10-CM | POA: Diagnosis present

## 2019-04-01 DIAGNOSIS — R29703 NIHSS score 3: Secondary | ICD-10-CM | POA: Diagnosis present

## 2019-04-01 DIAGNOSIS — Z955 Presence of coronary angioplasty implant and graft: Secondary | ICD-10-CM | POA: Diagnosis not present

## 2019-04-01 DIAGNOSIS — Z7902 Long term (current) use of antithrombotics/antiplatelets: Secondary | ICD-10-CM

## 2019-04-01 DIAGNOSIS — F419 Anxiety disorder, unspecified: Secondary | ICD-10-CM | POA: Diagnosis present

## 2019-04-01 DIAGNOSIS — Z7982 Long term (current) use of aspirin: Secondary | ICD-10-CM

## 2019-04-01 DIAGNOSIS — K7460 Unspecified cirrhosis of liver: Secondary | ICD-10-CM | POA: Diagnosis present

## 2019-04-01 DIAGNOSIS — I252 Old myocardial infarction: Secondary | ICD-10-CM

## 2019-04-01 DIAGNOSIS — E118 Type 2 diabetes mellitus with unspecified complications: Secondary | ICD-10-CM

## 2019-04-01 DIAGNOSIS — Z794 Long term (current) use of insulin: Secondary | ICD-10-CM

## 2019-04-01 DIAGNOSIS — Z20828 Contact with and (suspected) exposure to other viral communicable diseases: Secondary | ICD-10-CM | POA: Diagnosis present

## 2019-04-01 DIAGNOSIS — G4733 Obstructive sleep apnea (adult) (pediatric): Secondary | ICD-10-CM | POA: Diagnosis present

## 2019-04-01 DIAGNOSIS — J449 Chronic obstructive pulmonary disease, unspecified: Secondary | ICD-10-CM | POA: Diagnosis present

## 2019-04-01 DIAGNOSIS — M316 Other giant cell arteritis: Secondary | ICD-10-CM | POA: Diagnosis present

## 2019-04-01 DIAGNOSIS — Z79891 Long term (current) use of opiate analgesic: Secondary | ICD-10-CM

## 2019-04-01 DIAGNOSIS — I639 Cerebral infarction, unspecified: Secondary | ICD-10-CM | POA: Diagnosis present

## 2019-04-01 DIAGNOSIS — Z87891 Personal history of nicotine dependence: Secondary | ICD-10-CM

## 2019-04-01 DIAGNOSIS — G118 Other hereditary ataxias: Secondary | ICD-10-CM

## 2019-04-01 DIAGNOSIS — Z823 Family history of stroke: Secondary | ICD-10-CM

## 2019-04-01 DIAGNOSIS — K7581 Nonalcoholic steatohepatitis (NASH): Secondary | ICD-10-CM | POA: Diagnosis present

## 2019-04-01 DIAGNOSIS — Z888 Allergy status to other drugs, medicaments and biological substances status: Secondary | ICD-10-CM

## 2019-04-01 LAB — DIFFERENTIAL
Abs Immature Granulocytes: 0.02 10*3/uL (ref 0.00–0.07)
Basophils Absolute: 0 10*3/uL (ref 0.0–0.1)
Basophils Relative: 1 %
Eosinophils Absolute: 0.3 10*3/uL (ref 0.0–0.5)
Eosinophils Relative: 5 %
Immature Granulocytes: 0 %
Lymphocytes Relative: 19 %
Lymphs Abs: 1.3 10*3/uL (ref 0.7–4.0)
Monocytes Absolute: 0.5 10*3/uL (ref 0.1–1.0)
Monocytes Relative: 7 %
Neutro Abs: 4.9 10*3/uL (ref 1.7–7.7)
Neutrophils Relative %: 68 %

## 2019-04-01 LAB — TROPONIN I (HIGH SENSITIVITY)
Troponin I (High Sensitivity): 29 ng/L — ABNORMAL HIGH (ref <18)
Troponin I (High Sensitivity): 39 ng/L — ABNORMAL HIGH (ref <18)

## 2019-04-01 LAB — COMPREHENSIVE METABOLIC PANEL
ALT: 62 U/L — ABNORMAL HIGH (ref 0–44)
AST: 100 U/L — ABNORMAL HIGH (ref 15–41)
Albumin: 3.6 g/dL (ref 3.5–5.0)
Alkaline Phosphatase: 101 U/L (ref 38–126)
Anion gap: 11 (ref 5–15)
BUN: 18 mg/dL (ref 6–20)
CO2: 22 mmol/L (ref 22–32)
Calcium: 9.6 mg/dL (ref 8.9–10.3)
Chloride: 102 mmol/L (ref 98–111)
Creatinine, Ser: 1.17 mg/dL (ref 0.61–1.24)
GFR calc Af Amer: >60 mL/min (ref >60)
GFR calc non Af Amer: >60 mL/min (ref >60)
Glucose, Bld: 264 mg/dL — ABNORMAL HIGH (ref 70–99)
Potassium: 4.4 mmol/L (ref 3.5–5.1)
Sodium: 135 mmol/L (ref 135–145)
Total Bilirubin: 1.4 mg/dL — ABNORMAL HIGH (ref 0.3–1.2)
Total Protein: 7.2 g/dL (ref 6.5–8.1)

## 2019-04-01 LAB — PROTIME-INR
INR: 1 (ref 0.8–1.2)
Prothrombin Time: 13.2 seconds (ref 11.4–15.2)

## 2019-04-01 LAB — CBC
HCT: 43.4 % (ref 39.0–52.0)
Hemoglobin: 15.4 g/dL (ref 13.0–17.0)
MCH: 31.8 pg (ref 26.0–34.0)
MCHC: 35.5 g/dL (ref 30.0–36.0)
MCV: 89.5 fL (ref 80.0–100.0)
Platelets: 175 10*3/uL (ref 150–400)
RBC: 4.85 MIL/uL (ref 4.22–5.81)
RDW: 14.7 % (ref 11.5–15.5)
WBC: 7.1 10*3/uL (ref 4.0–10.5)
nRBC: 0 % (ref 0.0–0.2)

## 2019-04-01 LAB — GLUCOSE, CAPILLARY: Glucose-Capillary: 326 mg/dL — ABNORMAL HIGH (ref 70–99)

## 2019-04-01 LAB — ETHANOL: Alcohol, Ethyl (B): <10 mg/dL (ref <10)

## 2019-04-01 LAB — APTT: aPTT: 27 seconds (ref 24–36)

## 2019-04-01 MED ORDER — METOPROLOL TARTRATE 50 MG PO TABS
50.0000 mg | ORAL_TABLET | Freq: Two times a day (BID) | ORAL | Status: DC
  Administered 2019-04-02 – 2019-04-04 (×5): 50 mg via ORAL
  Filled 2019-04-01 (×5): qty 1

## 2019-04-01 MED ORDER — TRAMADOL HCL 50 MG PO TABS
50.0000 mg | ORAL_TABLET | Freq: Four times a day (QID) | ORAL | Status: DC | PRN
  Administered 2019-04-02 – 2019-04-04 (×3): 50 mg via ORAL
  Filled 2019-04-01 (×3): qty 1

## 2019-04-01 MED ORDER — ATORVASTATIN CALCIUM 20 MG PO TABS
40.0000 mg | ORAL_TABLET | Freq: Every day | ORAL | Status: DC
  Administered 2019-04-01 – 2019-04-04 (×4): 40 mg via ORAL
  Filled 2019-04-01 (×4): qty 2

## 2019-04-01 MED ORDER — FAMOTIDINE 20 MG PO TABS
20.0000 mg | ORAL_TABLET | Freq: Two times a day (BID) | ORAL | Status: DC
  Administered 2019-04-01 – 2019-04-04 (×6): 20 mg via ORAL
  Filled 2019-04-01 (×6): qty 1

## 2019-04-01 MED ORDER — IOHEXOL 350 MG/ML SOLN
75.0000 mL | Freq: Once | INTRAVENOUS | Status: AC | PRN
  Administered 2019-04-01: 18:00:00 75 mL via INTRAVENOUS

## 2019-04-01 MED ORDER — VENLAFAXINE HCL ER 150 MG PO CP24
150.0000 mg | ORAL_CAPSULE | Freq: Every day | ORAL | Status: DC
  Administered 2019-04-02 – 2019-04-04 (×3): 150 mg via ORAL
  Filled 2019-04-01: qty 1
  Filled 2019-04-01: qty 2
  Filled 2019-04-01 (×2): qty 1
  Filled 2019-04-01 (×2): qty 2

## 2019-04-01 MED ORDER — NITROGLYCERIN 0.4 MG SL SUBL: 0.4000 mg | SUBLINGUAL_TABLET | SUBLINGUAL | Status: DC | PRN

## 2019-04-01 MED ORDER — ACETAMINOPHEN 160 MG/5ML PO SOLN
650.0000 mg | ORAL | Status: DC | PRN
  Filled 2019-04-01: qty 20.3

## 2019-04-01 MED ORDER — LABETALOL HCL 5 MG/ML IV SOLN
5.0000 mg | INTRAVENOUS | Status: DC | PRN
  Administered 2019-04-01 – 2019-04-02 (×2): 5 mg via INTRAVENOUS
  Filled 2019-04-01 (×2): qty 4

## 2019-04-01 MED ORDER — STROKE: EARLY STAGES OF RECOVERY BOOK
Freq: Once | Status: AC
  Administered 2019-04-01: 23:00:00

## 2019-04-01 MED ORDER — ACETAMINOPHEN 325 MG PO TABS: 650.0000 mg | ORAL_TABLET | ORAL | Status: DC | PRN

## 2019-04-01 MED ORDER — INSULIN ASPART 100 UNIT/ML ~~LOC~~ SOLN
0.0000 [IU] | Freq: Every day | SUBCUTANEOUS | Status: DC
  Administered 2019-04-01: 23:00:00 4 [IU] via SUBCUTANEOUS
  Administered 2019-04-02 – 2019-04-03 (×2): 3 [IU] via SUBCUTANEOUS
  Filled 2019-04-01 (×3): qty 1

## 2019-04-01 MED ORDER — ASPIRIN 81 MG PO CHEW
243.0000 mg | CHEWABLE_TABLET | Freq: Once | ORAL | Status: AC
  Administered 2019-04-01: 243 mg via ORAL
  Filled 2019-04-01: qty 3

## 2019-04-01 MED ORDER — SENNOSIDES-DOCUSATE SODIUM 8.6-50 MG PO TABS: 1.0000 | ORAL_TABLET | Freq: Every evening | ORAL | Status: DC | PRN

## 2019-04-01 MED ORDER — ACETAMINOPHEN 650 MG RE SUPP: 650.0000 mg | RECTAL | Status: DC | PRN

## 2019-04-01 MED ORDER — PREGABALIN 50 MG PO CAPS
50.0000 mg | ORAL_CAPSULE | Freq: Three times a day (TID) | ORAL | Status: DC
  Administered 2019-04-01 – 2019-04-04 (×8): 50 mg via ORAL
  Filled 2019-04-01 (×8): qty 1

## 2019-04-01 MED ORDER — ASPIRIN 81 MG PO CHEW
81.0000 mg | CHEWABLE_TABLET | Freq: Every day | ORAL | Status: DC
  Administered 2019-04-02 – 2019-04-04 (×3): 81 mg via ORAL
  Filled 2019-04-01 (×3): qty 1

## 2019-04-01 MED ORDER — CLOPIDOGREL BISULFATE 75 MG PO TABS
75.0000 mg | ORAL_TABLET | Freq: Every day | ORAL | Status: DC
  Administered 2019-04-02 – 2019-04-04 (×3): 75 mg via ORAL
  Filled 2019-04-01 (×3): qty 1

## 2019-04-01 MED ORDER — ENOXAPARIN SODIUM 40 MG/0.4ML ~~LOC~~ SOLN
40.0000 mg | SUBCUTANEOUS | Status: DC
  Administered 2019-04-02 – 2019-04-04 (×3): 40 mg via SUBCUTANEOUS
  Filled 2019-04-01 (×3): qty 0.4

## 2019-04-01 MED ORDER — SODIUM CHLORIDE 0.9 % IV SOLN
INTRAVENOUS | Status: DC
  Administered 2019-04-01: 23:00:00 via INTRAVENOUS

## 2019-04-01 MED ORDER — INSULIN ASPART 100 UNIT/ML ~~LOC~~ SOLN
0.0000 [IU] | Freq: Three times a day (TID) | SUBCUTANEOUS | Status: DC
  Administered 2019-04-02: 9 [IU] via SUBCUTANEOUS
  Administered 2019-04-02 (×2): 3 [IU] via SUBCUTANEOUS
  Administered 2019-04-03 (×2): 5 [IU] via SUBCUTANEOUS
  Administered 2019-04-03: 17:00:00 3 [IU] via SUBCUTANEOUS
  Administered 2019-04-04 (×2): 5 [IU] via SUBCUTANEOUS
  Filled 2019-04-01 (×8): qty 1

## 2019-04-01 NOTE — ED Notes (Signed)
PT given dinner with MD permission

## 2019-04-01 NOTE — ED Notes (Signed)
Patient transported to MRI 

## 2019-04-01 NOTE — ED Notes (Signed)
ED Provider at bedside. 

## 2019-04-01 NOTE — ED Notes (Signed)
Called code stroke to [phone] at [phone]

## 2019-04-01 NOTE — ED Provider Notes (Signed)
[facility] Emergency Department Provider Note   ____________________________________________   First MD Initiated Contact with Patient 04/01/19 1714     (approximate)  I have reviewed the triage vital signs and the nursing notes.   HISTORY  Chief Complaint Transient Ischemic Attack and Hypertension    HPI Larry Terrell is a 58 y.o. male with past medical history of diabetes, COPD, and CHF who presents to the ED complaining of unsteady gait and speech changes.  Patient reports that he first noticed something was different between 7 and 8 AM this morning, when he had trouble making himself a sandwich for breakfast.  He states he has been very unsteady on his feet since then with trouble walking.  He states "I cannot control my body".  He is also noticed difficulty with his speech and has seemed to been slurring his words.  He denies any alcohol or drug abuse.  He states he has been feeling well recently with no fevers, chills, chest pain, cough, or shortness of breath.  He denies any focal numbness or weakness, to states he cannot move his arms or legs the way he would like.        Past Medical History:  Diagnosis Date  . CHF (congestive heart failure) (HCC)    diastolic (echo at [facility] 9390) EF 55-60%  . Cirrhosis ([place])    NASH per records  . COPD (chronic obstructive pulmonary disease) ([place])   . Depression   . Diabetes mellitus with neuropathy ([place])   . Heart attack ([place])   . Neuropathy   . OSA (obstructive sleep apnea)   . Peripheral vascular disease due to secondary diabetes mellitus ([place])   . Splenomegaly   . Temporal giant cell arteritis ([place])   . TIA (transient ischemic attack) April 2016    Patient Active Problem List   Diagnosis Date Noted  . Stroke ([place]) 04/01/2019  . NSTEMI (non-ST elevated myocardial infarction) ([place]) 08/06/2017  . Acute appendicitis 07/10/2015  . S/P laparoscopic appendectomy 07/10/2015  . Appendicitis, acute   .  Diabetes mellitus with complication ([place])   . Chronic congestive heart failure with left ventricular diastolic dysfunction ([place])   . Lactic acidosis   . Acute respiratory failure with hypoxia [facility])     Past Surgical History:  Procedure Laterality Date  . AORTA - FEMORAL ARTERY BYPASS GRAFT Bilateral   . CARDIAC CATHETERIZATION    . cardiac stents  July 2015   . CORONARY STENT INTERVENTION N/A 08/09/2017   Procedure: CORONARY STENT INTERVENTION;  Surgeon: Wellington Hampshire, MD;  Location: [place] [facility];  Service: Cardiovascular;  Laterality: N/A;  . LAPAROSCOPIC APPENDECTOMY N/A 07/10/2015   Procedure: APPENDECTOMY LAPAROSCOPIC;  Surgeon: Rolm Bookbinder, MD;  Location: [place];  Service: General;  Laterality: N/A;  . LEFT HEART CATH AND CORONARY ANGIOGRAPHY N/A 08/09/2017   Procedure: LEFT HEART CATH AND CORONARY ANGIOGRAPHY;  Surgeon: Wellington Hampshire, MD;  Location: [place] [facility];  Service: Cardiovascular;  Laterality: N/A;    Prior to Admission medications   Medication Sig Start Date End Date Taking? Authorizing Provider  aspirin 81 MG chewable tablet Chew 1 tablet (81 mg total) by mouth daily. 08/11/17   Fritzi Mandes, MD  atorvastatin (LIPITOR) 40 MG tablet Take 40 mg by mouth daily.     [provider]  clopidogrel (PLAVIX) 75 MG tablet Take 1 tablet (75 mg total) by mouth daily with breakfast. 08/11/17   Fritzi Mandes, MD  famotidine (  PEPCID) 20 MG tablet Take 1 tablet (20 mg total) by mouth 2 (two) times daily. 12/26/17   Paulette Blanch, MD  insulin NPH-regular Human (NOVOLIN 70/30) (70-30) 100 UNIT/ML injection Inject 20-24 Units into the skin 2 (two) times daily. Take 24units in the morning and 20units in the evening.    [provider]  metFORMIN (GLUCOPHAGE) 500 MG tablet Take 500 mg by mouth 2 (two) times daily with a meal.    [provider]  metoprolol (LOPRESSOR) 50 MG tablet Take 50 mg by mouth 2 (two) times daily.    [provider]   nitroGLYCERIN (NITROSTAT) 0.4 MG SL tablet Place 0.4 mg under the tongue every 5 (five) minutes as needed for chest pain.    [provider]  pregabalin (LYRICA) 50 MG capsule Take 1 capsule (50 mg total) by mouth 3 (three) times daily. 03/22/19 03/21/20  Earleen Newport, MD  traMADol (ULTRAM) 50 MG tablet Take 1 tablet (50 mg total) by mouth every 6 (six) hours as needed. Patient taking differently: Take 50 mg by mouth every 6 (six) hours as needed for moderate pain.  05/14/15   Loney Hering, MD  venlafaxine XR (EFFEXOR-XR) 150 MG 24 hr capsule Take 150 mg by mouth daily with breakfast.    [provider]    Allergies Ace inhibitors  Family History  Problem Relation Age of Onset  . Heart attack Mother 57  . Stroke Mother   . Heart attack Father   . Alzheimer's disease Father     Social History Social History   Tobacco Use  . Smoking status: Former Smoker    Types: Cigarettes    Quit date: 2009    Years since quitting: 11.7  . Smokeless tobacco: Never Used  Substance Use Topics  . Alcohol use: No    Alcohol/week: 0.0 standard drinks  . Drug use: No    Review of Systems  Constitutional: No fever/chills Eyes: No visual changes. ENT: No sore throat. Cardiovascular: Denies chest pain. Respiratory: Denies shortness of breath. Gastrointestinal: No abdominal pain.  No nausea, no vomiting.  No diarrhea.  No constipation. Genitourinary: Negative for dysuria. Musculoskeletal: Negative for back pain. Skin: Negative for rash. Neurological: Negative for headaches, focal weakness or numbness.  Positive for poor coordination and speech changes.  ____________________________________________   PHYSICAL EXAM:  VITAL SIGNS: ED Triage Vitals [04/01/19 1712]  Enc Vitals Group     BP      Pulse      Resp      Temp      Temp src      SpO2 92 %     Weight      Height      Head Circumference      Peak Flow      Pain Score      Pain Loc      Pain Edu?       Excl. in [place]?     Constitutional: Alert and oriented. Eyes: Conjunctivae are normal. Head: Atraumatic. Nose: No congestion/rhinnorhea. Mouth/Throat: Mucous membranes are moist. Neck: Normal ROM Cardiovascular: Normal rate, regular rhythm. Grossly normal heart sounds. Respiratory: Normal respiratory effort.  No retractions. Lungs CTAB. Gastrointestinal: Soft and nontender. No distention. Genitourinary: deferred Musculoskeletal: No lower extremity tenderness nor edema. Neurologic: Slurred speech with intact comprehension.  Cranial nerves II through XII grossly intact, no facial droop.  5 out of 5 strength in bilateral upper and lower extremities, no pronator drift.  Dysmetria  noted with finger-to-nose testing on left and poor heel-to-shin coordination on left as well. Skin:  Skin is warm, dry and intact. No rash noted. Psychiatric: Mood and affect are normal.  ____________________________________________   LABS (all labs ordered are listed, but only abnormal results are displayed)  Labs Reviewed  COMPREHENSIVE METABOLIC PANEL - Abnormal; Notable for the following components:      Result Value   Glucose, Bld 264 (*)    AST 100 (*)    ALT 62 (*)    Total Bilirubin 1.4 (*)    All other components within normal limits  GLUCOSE, CAPILLARY - Abnormal; Notable for the following components:   Glucose-Capillary 326 (*)    All other components within normal limits  TROPONIN I (HIGH SENSITIVITY) - Abnormal; Notable for the following components:   Troponin I (High Sensitivity) 29 (*)    All other components within normal limits  TROPONIN I (HIGH SENSITIVITY) - Abnormal; Notable for the following components:   Troponin I (High Sensitivity) 39 (*)    All other components within normal limits  SARS CORONAVIRUS 2 (TAT 6-24 HRS)  PROTIME-INR  APTT  CBC  DIFFERENTIAL  ETHANOL  HIV ANTIBODY (ROUTINE TESTING W REFLEX)  HEMOGLOBIN A1C  LIPID PANEL    ____________________________________________  EKG  ED ECG REPORT I, Blake Divine, the attending physician, personally viewed and interpreted this ECG.   Date: 04/01/2019  EKG Time: 17:17  Rate: 98  Rhythm: normal sinus rhythm  Axis: LAD  Intervals:none  ST&T Change: Q waves inferiorly and anteriorly with T wave flattening inferiorly    PROCEDURES  Procedure(s) performed (including Critical Care):  Procedures   ____________________________________________   INITIAL IMPRESSION / ASSESSMENT AND PLAN / ED COURSE       58 year old male presents to the ED with onset of difficulty with balance around 7 or 8 AM this morning along with slurred speech.  He has no focal numbness or weakness on exam but does have apparent dysarthria and dysmetria concerning for acute stroke.  He is outside the window for TPA, however is inside the window for endovascular intervention, given concern for posterior stroke, will obtain CTA head and neck.  Code stroke called and patient will undergo tele-neurology evaluation.  Blood pressure elevated at this time, however given concern for acute stroke will allow for permissive hypertension given he is not a candidate for TPA.  Head CT negative for acute process.  Patient evaluated by Dr. 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  Symptoms may be recurrence of his episodic ataxia, although patient states current episode lasting much longer than usual.  CTA was obtained and does show significant atherosclerotic disease throughout but no evidence of acute large vessel occlusion.  He is clearly at risk for stroke and Dr. Karle Barr recommends admission for further stroke work-up.  Case discussed with hospitalist, who accepts patient for admission.      ____________________________________________   FINAL CLINICAL IMPRESSION(S) / ED DIAGNOSES  Final diagnoses:  Ataxia  Dysarthria  Episodic ataxia [facility])     ED  Discharge Orders    None       Note:  This document was prepared using Dragon voice recognition software and may include unintentional dictation errors.   Blake Divine, MD 04/02/19 [id number]

## 2019-04-01 NOTE — ED Notes (Signed)
Informed MD of elevated BP. No new orders.

## 2019-04-01 NOTE — ED Notes (Signed)
ED TO INPATIENT HANDOFF REPORT  ED Nurse Name and Phone #: Terina Mcelhinny [phone]  S Name/Age/Gender Larry Terrell 58 y.o. male Room/Bed: ED11A/ED11A  Code Status   Code Status: Full Code  Home/SNF/Other Home Patient oriented to: self, place, time and situation Is this baseline? Yes   Triage Complete: Triage complete  Chief Complaint Hypertension  Triage Note PT to ED via EMS from home. PT has been having falls and unsteady gate since 8a this morning, then started having slurred speech this afternoon. PT with hx of DM2 and cardiac hx. PT alert and oriented with slurred speech, placed on o2 by EMS for 92% RA sats.    Allergies Allergies  Allergen Reactions  . Ace Inhibitors Cough    Other reaction(s): Other (See Comments) cough    Level of Care/Admitting Diagnosis ED Disposition    ED Disposition Condition [facility]: [place] [[id number]]  Level of Care: Med-Surg [16]  Covid Evaluation: Asymptomatic Screening Protocol (No Symptoms)  Diagnosis: Stroke [facility]) [[id number]]  Admitting Physician: Otila Back [[id number]]  Attending Physician: Otila Back [[id number]]  Estimated length of stay: past midnight tomorrow  Certification:: I certify this patient will need inpatient services for at least 2 midnights  PT Class (Do Not Modify): Inpatient [101]  PT Acc Code (Do Not Modify): Private [1]       B Medical/Surgery History Past Medical History:  Diagnosis Date  . CHF (congestive heart failure) (HCC)    diastolic (echo at [facility] 0488) EF 55-60%  . Cirrhosis ([place])    NASH per records  . COPD (chronic obstructive pulmonary disease) ([place])   . Depression   . Diabetes mellitus with neuropathy ([place])   . Heart attack ([place])   . Neuropathy   . OSA (obstructive sleep apnea)   . Peripheral vascular disease due to secondary diabetes mellitus ([place])   . Splenomegaly   . 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 TIA (transient ischemic attack) April 2016   Past  Surgical History:  Procedure Laterality Date  . AORTA - FEMORAL ARTERY BYPASS GRAFT Bilateral   . CARDIAC CATHETERIZATION    . cardiac stents  July 2015   . CORONARY STENT INTERVENTION N/A 08/09/2017   Procedure: CORONARY STENT INTERVENTION;  Surgeon: Wellington Hampshire, MD;  Location: [place] [facility];  Service: Cardiovascular;  Laterality: N/A;  . LAPAROSCOPIC APPENDECTOMY N/A 07/10/2015   Procedure: APPENDECTOMY LAPAROSCOPIC;  Surgeon: Rolm Bookbinder, MD;  Location: [place];  Service: General;  Laterality: N/A;  . LEFT HEART CATH AND CORONARY ANGIOGRAPHY N/A 08/09/2017   Procedure: LEFT HEART CATH AND CORONARY ANGIOGRAPHY;  Surgeon: Wellington Hampshire, MD;  Location: [place] [facility];  Service: Cardiovascular;  Laterality: N/A;     A IV Location/Drains/Wounds Patient Lines/Drains/Airways Status   Active Line/Drains/Airways    Name:   Placement date:   Placement time:   Site:   Days:   Peripheral IV 04/01/19 Anterior;Left Hand   04/01/19    1702    Hand   less than 1   Peripheral IV 04/01/19 Right Hand   04/01/19    1723    Hand   less than 1   Peripheral IV 04/01/19 Left Antecubital   04/01/19    1725    Antecubital   less than 1          Intake/Output Last 24 hours No intake or output data in the 24 hours ending 04/01/19 2038  Labs/Imaging  Results for orders placed or performed during the hospital encounter of 04/01/19 (from the past 48 hour(s))  Protime-INR     Status: None   Collection Time: 04/01/19  5:19 PM  Result Value Ref Range   Prothrombin Time 13.2 11.4 - 15.2 seconds   INR 1.0 0.8 - 1.2    Comment: (NOTE) INR goal varies based on device and disease states. Performed at [facility], [address] [place], [place] [address]   APTT     Status: None   Collection Time: 04/01/19  5:19 PM  Result Value Ref Range   aPTT 27 24 - 36 seconds    Comment: Performed at [facility], [address] [place], [place] [address]  CBC     Status:  None   Collection Time: 04/01/19  5:19 PM  Result Value Ref Range   WBC 7.1 4.0 - 10.5 K/uL   RBC 4.85 4.22 - 5.81 MIL/uL   Hemoglobin 15.4 13.0 - 17.0 g/dL   HCT 43.4 39.0 - 52.0 %   MCV 89.5 80.0 - 100.0 fL   MCH 31.8 26.0 - 34.0 pg   MCHC 35.5 30.0 - 36.0 g/dL   RDW 14.7 11.5 - 15.5 %   Platelets 175 150 - 400 K/uL   nRBC 0.0 0.0 - 0.2 %    Comment: Performed at [facility], [address] [place], [place] [address]  Differential     Status: None   Collection Time: 04/01/19  5:19 PM  Result Value Ref Range   Neutrophils Relative % 68 %   Neutro Abs 4.9 1.7 - 7.7 K/uL   Lymphocytes Relative 19 %   Lymphs Abs 1.3 0.7 - 4.0 K/uL   Monocytes Relative 7 %   Monocytes Absolute 0.5 0.1 - 1.0 K/uL   Eosinophils Relative 5 %   Eosinophils Absolute 0.3 0.0 - 0.5 K/uL   Basophils Relative 1 %   Basophils Absolute 0.0 0.0 - 0.1 K/uL   Immature Granulocytes 0 %   Abs Immature Granulocytes 0.02 0.00 - 0.07 K/uL    Comment: Performed at [facility], [address] [place], [place] [address]  Comprehensive metabolic panel     Status: Abnormal   Collection Time: 04/01/19  5:19 PM  Result Value Ref Range   Sodium 135 135 - 145 mmol/L   Potassium 4.4 3.5 - 5.1 mmol/L    Comment: HEMOLYSIS AT THIS LEVEL MAY AFFECT RESULT   Chloride 102 98 - 111 mmol/L   CO2 22 22 - 32 mmol/L   Glucose, Bld 264 (H) 70 - 99 mg/dL   BUN 18 6 - 20 mg/dL   Creatinine, Ser 1.17 0.61 - 1.24 mg/dL   Calcium 9.6 8.9 - 10.3 mg/dL   Total Protein 7.2 6.5 - 8.1 g/dL   Albumin 3.6 3.5 - 5.0 g/dL   AST 100 (H) 15 - 41 U/L    Comment: HEMOLYSIS AT THIS LEVEL MAY AFFECT RESULT   ALT 62 (H) 0 - 44 U/L    Comment: HEMOLYSIS AT THIS LEVEL MAY AFFECT RESULT   Alkaline Phosphatase 101 38 - 126 U/L   Total Bilirubin 1.4 (H) 0.3 - 1.2 mg/dL    Comment: HEMOLYSIS AT THIS LEVEL MAY AFFECT RESULT   GFR calc non Af Amer >60 >60 mL/min   GFR calc Af Amer >60 >60 mL/min   Anion gap 11 5 - 15     Comment: Performed at [facility], [place]  [address] [place], [place] [address]  Ethanol     Status: None   Collection Time: 04/01/19  5:19 PM  Result Value Ref Range   Alcohol, Ethyl (B) <10 <10 mg/dL    Comment: (NOTE) Lowest detectable limit for serum alcohol is 10 mg/dL. For medical purposes only. Performed at [facility], [place], [place] [address]   Troponin I (High Sensitivity)     Status: Abnormal   Collection Time: 04/01/19  5:19 PM  Result Value Ref Range   Troponin I (High Sensitivity) 29 (H) <18 ng/L    Comment: (NOTE) Elevated high sensitivity troponin I (hsTnI) values and significant  changes across serial measurements may suggest ACS but many other  chronic and acute conditions are known to elevate hsTnI results.  Refer to the "Links" section for chest pain algorithms and additional  guidance. Performed at [facility], [place], [place] [address]    Ct Angio Head W Or Wo Contrast  Result Date: 04/01/2019 CLINICAL DATA:  58 year old male with slurred speech. Unsteadiness and fall this morning. EXAM: CT ANGIOGRAPHY HEAD AND NECK TECHNIQUE: Multidetector CT imaging of the head and neck was performed using the standard protocol during bolus administration of intravenous contrast. Multiplanar CT image reconstructions and MIPs were obtained to evaluate the vascular anatomy. Carotid stenosis measurements (when applicable) are obtained utilizing NASCET criteria, using the distal internal carotid diameter as the denominator. CONTRAST:  46m OMNIPAQUE IOHEXOL 350 MG/ML SOLN COMPARISON:  Plain head CT 1743 hours today.  CTA chest 08/06/2017. FINDINGS: CTA NECK Skeleton: Flowing osteophytes in the lower cervical spine. No acute osseous abnormality identified. Upper chest: Patchy peribronchial opacity scattered in both upper lobes, mostly ground-glass. At the same time there is no confluent dependent opacity to suggest this is  atelectasis. Although there are some atelectatic changes to the central airways. Visible mediastinal lymph nodes are within normal limits. Other neck: No acute findings. Aortic arch: 4 vessel arch configuration, the left vertebral arises directly from the arch. Mild for age arch atherosclerosis. Right carotid system: No brachiocephalic or right CCA origin stenosis despite some plaque. Soft and calcified plaque at the right carotid bifurcation. Stenosis up to 60 % with respect to the distal vessel at the right ICA origin. Tortuous right ICA distal to the bulb with a mildly kinked appearance at the C1-C2 level, no additional stenosis to the skull base. Left carotid system: No left CCA origin stenosis despite plaque. Bulky soft plaque in the ventral left CCA at the level of the larynx without stenosis (series 5, image 58). Soft and calcified plaque at the left ICA origin without stenosis. Mildly tortuous or left ICA. The vessel remains patent to the skull base, but see details of the abnormal left ICA siphon below. Vertebral arteries: No proximal right subclavian artery stenosis despite some plaque. Normal right vertebral artery origin. Moderate stenosis of the right vertebral V2 segment at the C4-C5 level on series 7, image 134. The vessel remains patent to the skull base without additional stenosis. The left vertebral arises directly from the arch, no stenosis at its origin. As a late entry into the transverse foramen and is mildly dominant, remaining patent at the skull base without stenosis. CTA HEAD Posterior circulation: Bulky calcified plaque of the right vertebral V4 segment which is occluded just beyond the right PICA origin which remains patent on series 7, image 220. A portion of the distal right V4 is reconstituted (image 226). More dominant appearing left  vertebral artery with V4 segment plaque and mild to moderate stenosis proximal to the vertebrobasilar junction. The left vertebral supplies the basilar.  The basilar is patent with irregularity but no hemodynamically significant stenosis. Patent SCA and PCA origins. Both posterior communicating arteries are present. Severe stenosis of the left PCA P2 segment, which remains patent distally. Right PCA branches are within normal limits. Anterior circulation: High-grade stenosis of the petrous left ICA at the skull base resulting in a radiographic string sign (series 7, image 224 and series 9, image 160). The vessel remains patent but is highly irregular with continued severe plaque and tandem radiographic string sign the cavernous segment on image 229. Superimposed severe stenosis of the proximal supraclinoid segment. Despite these the left ICA terminus remains patent. Normal left posterior communicating artery origin. Patent left MCA and ACA origins, mildly irregular. Left MCA M1 segment is diminutive but patent. Patent left MCA bifurcation, and no left MCA branch occlusion identified. Proximal right ICA siphon is normal but there is extensive plaque in the cavernous segment with a high-grade stenosis, radiographic string sign on series 7, image 233. Continued severe plaque through the supraclinoid segment with moderate to severe additional supraclinoid stenosis (image 244). Right posterior communicating artery remains patent. Right ICA terminus, right MCA and ACA origins remain patent. Anterior communicating artery is diminutive or absent. ACA A1 segments and bilateral ACA branches are within normal limits. Right MCA M1 and trifurcation are patent. Right MCA branches are within normal limits. Venous sinuses: Patent. Anatomic variants: Dominant left vertebral artery which arises directly from the arch. Review of the MIP images confirms the above findings IMPRESSION: 1. Negative for large vessel occlusion, but positive for severe intracranial atherosclerosis with RADIOGRAPHIC STRING SIGN stenosis of both ICA siphons, worse on the left. There is a greater degree of soft  plaque or thrombus in the left ICA siphon, and also the left CCA. 2. Segmental occlusion of the right vertebral artery V4 segment, distal to the right PICA origin which remains patent. 3. Additional hemodynamically significant stenoses: - right ICA origin (60%). - left vertebral artery V4 segment (up to moderate). - left PCA P2 segment (severe). 4. Nonspecific upper lobe peribronchial ground-glass pulmonary opacity. Recommend correlation with chest radiographs when possible. Preliminary report of the above discussed by telephone with Dr. Blake Divine on 04/01/2019 at 18:11. We discussed follow-up CT Perfusion, versus stat DWI brain MRI. Electronically Signed   By: Genevie Ann M.D.   On: 04/01/2019 18:19   Ct Angio Neck W And/or Wo Contrast  Result Date: 04/01/2019 CLINICAL DATA:  58 year old male with slurred speech. Unsteadiness and fall this morning. EXAM: CT ANGIOGRAPHY HEAD AND NECK TECHNIQUE: Multidetector CT imaging of the head and neck was performed using the standard protocol during bolus administration of intravenous contrast. Multiplanar CT image reconstructions and MIPs were obtained to evaluate the vascular anatomy. Carotid stenosis measurements (when applicable) are obtained utilizing NASCET criteria, using the distal internal carotid diameter as the denominator. CONTRAST:  33m OMNIPAQUE IOHEXOL 350 MG/ML SOLN COMPARISON:  Plain head CT 1743 hours today.  CTA chest 08/06/2017. FINDINGS: CTA NECK Skeleton: Flowing osteophytes in the lower cervical spine. No acute osseous abnormality identified. Upper chest: Patchy peribronchial opacity scattered in both upper lobes, mostly ground-glass. At the same time there is no confluent dependent opacity to suggest this is atelectasis. Although there are some atelectatic changes to the central airways. Visible mediastinal lymph nodes are within normal limits. Other neck: No acute findings. Aortic arch: 4 vessel  arch configuration, the left vertebral arises  directly from the arch. Mild for age arch atherosclerosis. Right carotid system: No brachiocephalic or right CCA origin stenosis despite some plaque. Soft and calcified plaque at the right carotid bifurcation. Stenosis up to 60 % with respect to the distal vessel at the right ICA origin. Tortuous right ICA distal to the bulb with a mildly kinked appearance at the C1-C2 level, no additional stenosis to the skull base. Left carotid system: No left CCA origin stenosis despite plaque. Bulky soft plaque in the ventral left CCA at the level of the larynx without stenosis (series 5, image 58). Soft and calcified plaque at the left ICA origin without stenosis. Mildly tortuous or left ICA. The vessel remains patent to the skull base, but see details of the abnormal left ICA siphon below. Vertebral arteries: No proximal right subclavian artery stenosis despite some plaque. Normal right vertebral artery origin. Moderate stenosis of the right vertebral V2 segment at the C4-C5 level on series 7, image 134. The vessel remains patent to the skull base without additional stenosis. The left vertebral arises directly from the arch, no stenosis at its origin. As a late entry into the transverse foramen and is mildly dominant, remaining patent at the skull base without stenosis. CTA HEAD Posterior circulation: Bulky calcified plaque of the right vertebral V4 segment which is occluded just beyond the right PICA origin which remains patent on series 7, image 220. A portion of the distal right V4 is reconstituted (image 226). More dominant appearing left vertebral artery with V4 segment plaque and mild to moderate stenosis proximal to the vertebrobasilar junction. The left vertebral supplies the basilar. The basilar is patent with irregularity but no hemodynamically significant stenosis. Patent SCA and PCA origins. Both posterior communicating arteries are present. Severe stenosis of the left PCA P2 segment, which remains patent distally.  Right PCA branches are within normal limits. Anterior circulation: High-grade stenosis of the petrous left ICA at the skull base resulting in a radiographic string sign (series 7, image 224 and series 9, image 160). The vessel remains patent but is highly irregular with continued severe plaque and tandem radiographic string sign the cavernous segment on image 229. Superimposed severe stenosis of the proximal supraclinoid segment. Despite these the left ICA terminus remains patent. Normal left posterior communicating artery origin. Patent left MCA and ACA origins, mildly irregular. Left MCA M1 segment is diminutive but patent. Patent left MCA bifurcation, and no left MCA branch occlusion identified. Proximal right ICA siphon is normal but there is extensive plaque in the cavernous segment with a high-grade stenosis, radiographic string sign on series 7, image 233. Continued severe plaque through the supraclinoid segment with moderate to severe additional supraclinoid stenosis (image 244). Right posterior communicating artery remains patent. Right ICA terminus, right MCA and ACA origins remain patent. Anterior communicating artery is diminutive or absent. ACA A1 segments and bilateral ACA branches are within normal limits. Right MCA M1 and trifurcation are patent. Right MCA branches are within normal limits. Venous sinuses: Patent. Anatomic variants: Dominant left vertebral artery which arises directly from the arch. Review of the MIP images confirms the above findings IMPRESSION: 1. Negative for large vessel occlusion, but positive for severe intracranial atherosclerosis with RADIOGRAPHIC STRING SIGN stenosis of both ICA siphons, worse on the left. There is a greater degree of soft plaque or thrombus in the left ICA siphon, and also the left CCA. 2. Segmental occlusion of the right vertebral artery V4 segment, distal to  the right PICA origin which remains patent. 3. Additional hemodynamically significant stenoses: -  right ICA origin (60%). - left vertebral artery V4 segment (up to moderate). - left PCA P2 segment (severe). 4. Nonspecific upper lobe peribronchial ground-glass pulmonary opacity. Recommend correlation with chest radiographs when possible. Preliminary report of the above discussed by telephone with Dr. Blake Divine on 04/01/2019 at 18:11. We discussed follow-up CT Perfusion, versus stat DWI brain MRI. Electronically Signed   By: Genevie Ann M.D.   On: 04/01/2019 18:19   Mr Brain Wo Contrast  Result Date: 04/01/2019 CLINICAL DATA:  58 year old male code stroke presentation with slurred speech. CTA head and neck revealing widespread severe intracranial atherosclerosis. EXAM: MRI HEAD WITHOUT CONTRAST TECHNIQUE: Multiplanar, multiecho pulse sequences of the brain and surrounding structures were obtained without intravenous contrast. COMPARISON:  CT head, CTA head and neck earlier today. FINDINGS: Brain: No restricted diffusion or evidence of acute infarction. Mild for age scattered cerebral white matter T2 and FLAIR hyperintensity, most pronounced in the periatrial white matter regions. No cortical encephalomalacia or chronic blood products identified. No restricted diffusion to suggest acute infarction. No midline shift, mass effect, evidence of mass lesion, ventriculomegaly, extra-axial collection or acute intracranial hemorrhage. Cervicomedullary junction and pituitary are within normal limits. Minor T2 heterogeneity in the deep gray matter nuclei and pons is new since 2016. There are tiny chronic infarcts in the right cerebellum which have not significantly changed on series 14, image 3. Vascular: Major intracranial vascular flow voids are diminished at proximal left ICA siphon, in the distal right vertebral artery corresponding to the CTA findings. Skull and upper cervical spine: Negative visible cervical spine. Visualized bone marrow signal is within normal limits. Sinuses/Orbits: Postoperative changes to the  left globe, otherwise negative orbits. Paranasal sinuses are clear. Other: Mild right mastoid effusion. Negative visible nasopharynx. Left mastoids are clear. Scalp and face soft tissues are negative. IMPRESSION: 1. Negative for acute infarct. 2. Loss of the normal vascular flow voids in the proximal left ICA siphon and distal right Vertebral artery corresponding to some of the CTA abnormalities today. 3. 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 Electronically Signed   By: Genevie Ann M.D.   On: 04/01/2019 19:58   Dg Chest Portable 1 View  Result Date: 04/01/2019 CLINICAL DATA:  Falls, unsteady gait, slurred speech EXAM: PORTABLE CHEST 1 VIEW COMPARISON:  03/22/2019 FINDINGS: Increased interstitial markings, chronic. No focal consolidation. No pleural effusion or pneumothorax. Mild cardiomegaly. IMPRESSION: No evidence of acute cardiopulmonary disease. Electronically Signed   By: Julian Hy M.D.   On: 04/01/2019 18:37   Ct Head Code Stroke Wo Contrast`  Addendum Date: 04/01/2019   ADDENDUM REPORT: 04/01/2019 18:19 ADDENDUM: Study discussed by telephone with Dr. Blake Divine on 04/01/2019 at 1800 hours. Electronically Signed   By: Genevie Ann M.D.   On: 04/01/2019 18:19   Result Date: 04/01/2019 CLINICAL DATA:  Code stroke. 58 year old male with slurred speech this afternoon. Falls and unsteady gait this morning. EXAM: CT HEAD WITHOUT CONTRAST TECHNIQUE: Contiguous axial images were obtained from the base of the skull through the vertex without intravenous contrast. COMPARISON:  Brain MRI 11/06/2014.  Head CT 11/19/2018. FINDINGS: Brain: No midline shift, mass effect, or evidence of intracranial mass lesion. No ventriculomegaly. No acute intracranial hemorrhage identified. Stable gray-white matter differentiation throughout the brain. Minimal to mild for age  white matter hypodensity is stable. Small dystrophic calcifications in the deep cerebellar nuclei are stable. Lesser similar basal ganglia calcifications. No cortically based acute infarct identified. Vascular: Extensive Calcified atherosclerosis at the skull base. No suspicious intracranial vascular hyperdensity. Skull: No acute osseous abnormality identified. Sinuses/Orbits: Visualized paranasal sinuses and mastoids are stable and well pneumatized. Other: Stable, negative orbit and scalp soft tissues. ASPECTS [facility] Stroke Program Early CT Score) Total score (0-10 with 10 being normal): 10 IMPRESSION: 1. No acute intracranial hemorrhage or cortically based infarct identified. ASPECTS 10. 2. Stable and largely negative for age noncontrast CT appearance of the brain. Electronically Signed: By: Genevie Ann M.D. On: 04/01/2019 17:55    Pending Labs Unresulted Labs (From admission, onward)    Start     Ordered   04/02/19 0500  Hemoglobin A1c  Tomorrow morning,   STAT     04/01/19 2000   04/02/19 0500  Lipid panel  Tomorrow morning,   STAT    Comments: Fasting    04/01/19 2000   04/01/19 1958  HIV antibody (Routine Testing)  Once,   STAT     04/01/19 2000   04/01/19 1849  SARS CORONAVIRUS 2 (TAT 6-24 HRS) Nasopharyngeal Nasopharyngeal Swab  (Asymptomatic/Tier 2 Patients Labs)  Once,   STAT    Question Answer Comment  Is this test for diagnosis or screening Screening   Symptomatic for COVID-19 as defined by CDC No   Hospitalized for COVID-19 No   Admitted to ICU for COVID-19 No   Previously tested for COVID-19 No   Resident in a congregate (group) care setting No   Employed in healthcare setting No      04/01/19 1848          Vitals/Pain Today's Vitals   04/01/19 1757 04/01/19 1800 04/01/19 1830 04/01/19 1900  BP: (!) 210/96 (!) 222/116 (!) 226/103 (!) 235/100  Pulse: 90 91 88 87  Resp: (!) 21 12 18 17   Temp:      TempSrc:      SpO2: 98% 94% 93% 94%    Isolation Precautions No  active isolations  Medications Medications  aspirin chewable tablet 81 mg (has no administration in time range)  traMADol (ULTRAM) tablet 50 mg (has no administration in time range)  atorvastatin (LIPITOR) tablet 40 mg (has no administration in time range)  metoprolol tartrate (LOPRESSOR) tablet 50 mg (has no administration in time range)  nitroGLYCERIN (NITROSTAT) SL tablet 0.4 mg (has no administration in time range)  venlafaxine XR (EFFEXOR-XR) 24 hr capsule 150 mg (has no administration in time range)  famotidine (PEPCID) tablet 20 mg (has no administration in time range)  clopidogrel (PLAVIX) tablet 75 mg (has no administration in time range)  pregabalin (LYRICA) capsule 50 mg (has no administration in time range)   stroke: mapping our early stages of recovery book (has no administration in time range)  0.9 %  sodium chloride infusion (has no administration in time range)  acetaminophen (TYLENOL) tablet 650 mg (has no administration in time range)    Or  acetaminophen (TYLENOL) solution 650 mg (has no administration in time range)    Or  acetaminophen (TYLENOL) suppository 650 mg (has no administration in time range)  senna-docusate (Senokot-S) tablet 1 tablet (has no administration in time range)  enoxaparin (LOVENOX) injection 40 mg (has no administration in time range)  insulin aspart (novoLOG) injection 0-9 Units (has no administration in time range)  insulin aspart (novoLOG) injection 0-5 Units (has no administration in time  range)  iohexol (OMNIPAQUE) 350 MG/ML injection 75 mL (75 mLs Intravenous Contrast Given 04/01/19 1741)  aspirin chewable tablet 243 mg (243 mg Oral Given 04/01/19 1835)    Mobility walks with person assist Moderate fall risk   Focused Assessments Neuro Assessment Handoff:  Swallow screen pass? No  Cardiac Rhythm: Normal sinus rhythm NIH Stroke Scale ( + Modified Stroke Scale Criteria)  Interval: Initial Level of Consciousness (1a.)   : Alert,  keenly responsive LOC Questions (1b. )   +: Answers both questions correctly LOC Commands (1c. )   + : Performs both tasks correctly Best Gaze (2. )  +: Normal Visual (3. )  +: No visual loss Facial Palsy (4. )    : Normal symmetrical movements Motor Arm, Left (5a. )   +: No drift Motor Arm, Right (5b. )   +: No drift Motor Leg, Left (6a. )   +: No drift Motor Leg, Right (6b. )   +: No drift Limb Ataxia (7. ): Present in one limb Sensory (8. )   +: Normal, no sensory loss Best Language (9. )   +: Mild-to-moderate aphasia Dysarthria (10. ): Mild-to-moderate dysarthria, patient slurs at least some words and, at worst, can be understood with some difficulty Extinction/Inattention (11.)   +: No Abnormality Modified SS Total  +: 1 Complete NIHSS TOTAL: 3 Last date known well: 04/01/19 Last time known well: 0800 Neuro Assessment:   Neuro Checks:   Initial (04/01/19 1727)  Last Documented NIHSS Modified Score: 1 (04/01/19 2018) Has TPA been given? No If patient is a Neuro Trauma and patient is going to OR before floor call report to [name] nurse: [phone] or [phone]     R Recommendations: See Admitting Provider Note  Report given to:   Additional Notes:

## 2019-04-01 NOTE — ED Triage Notes (Signed)
PT to ED via EMS from home. PT has been having falls and unsteady gate since 8a this morning, then started having slurred speech this afternoon. PT with hx of DM2 and cardiac hx. PT alert and oriented with slurred speech, placed on o2 by EMS for 92% RA sats.

## 2019-04-01 NOTE — H&P (Signed)
[facility] [place] NAME: Amer Alcindor    MR#:  [id number]  DATE OF BIRTH:  March 04, 1961  DATE OF ADMISSION:  04/01/2019  PRIMARY CARE PHYSICIAN: [facility] [employer]   REQUESTING/REFERRING PHYSICIAN: Blake Divine  CHIEF COMPLAINT:   Chief Complaint  Patient presents with   Transient Ischemic Attack   Hypertension    HISTORY OF PRESENT ILLNESS:  Sian Joles  is a 58 y.o. male with a known history of diabetes mellitus, episodic ataxia for which he follows up at [facility], COPD, chronic diastolic CHF who presented to the emergency room with complaints of slurred speech and unsteady gait.  Symptoms started about 7 AM when he woke up this morning.  Was having difficulty making his breakfast this morning.  Patient was evaluated in the emergency room.  Was not a candidate for TPA since exact onset of symptoms not clear.  Symptoms already improving.  Denies any history of alcohol abuse.  Code stroke was called in the emergency room and patient seen by telemetry neurologist.  Patient had CTA head and neck done which was negative for any large vessel occlusion but positive for severe intracranial atherosclerosis.  Patient subsequently had MRI of the brain done which was negative for any acute infarct.Loss of the normal vascular flow voids in the proximal left ICA siphon and distal right Vertebral artery corresponding to some of the CTA abnormalities today  Medical service called to admit patient for further evaluation.  PAST MEDICAL HISTORY:   Past Medical History:  Diagnosis Date   CHF (congestive heart failure) (HCC)    diastolic (echo at [facility]) EF 55-60%   Cirrhosis (HCC)    NASH per records   COPD (chronic obstructive pulmonary disease) ([place])    Depression    Diabetes mellitus with neuropathy ([place])    Heart attack ([place])    Neuropathy    OSA (obstructive sleep apnea)    Peripheral vascular disease due to secondary diabetes  mellitus ([place])    Splenomegaly    Temporal giant cell arteritis ([place])    TIA (transient ischemic attack) April 2016    PAST SURGICAL HISTORY:   Past Surgical History:  Procedure Laterality Date   AORTA - FEMORAL ARTERY BYPASS GRAFT Bilateral    CARDIAC CATHETERIZATION     cardiac stents  July 2015    CORONARY STENT INTERVENTION N/A 08/09/2017   Procedure: CORONARY STENT INTERVENTION;  Surgeon: Wellington Hampshire, MD;  Location: [facility];  Service: Cardiovascular;  Laterality: N/A;   LAPAROSCOPIC APPENDECTOMY N/A 07/10/2015   Procedure: APPENDECTOMY LAPAROSCOPIC;  Surgeon: Rolm Bookbinder, MD;  Location: [facility];  Service: General;  Laterality: N/A;   LEFT HEART CATH AND CORONARY ANGIOGRAPHY N/A 08/09/2017   Procedure: LEFT HEART CATH AND CORONARY ANGIOGRAPHY;  Surgeon: Wellington Hampshire, MD;  Location: [place] [facility];  Service: Cardiovascular;  Laterality: N/A;    SOCIAL HISTORY:   Social History   Tobacco Use   Smoking status: Former Smoker    Types: Cigarettes    Quit date: 2009    Years since quitting: 11.7   Smokeless tobacco: Never Used  Substance Use Topics   Alcohol use: No    Alcohol/week: 0.0 standard drinks    FAMILY HISTORY:   Family History  Problem Relation Age of Onset   Heart attack Mother 92   Stroke Mother    Heart attack Father    Alzheimer's disease Father  DRUG ALLERGIES:   Allergies  Allergen Reactions   Ace Inhibitors Cough    Other reaction(s): Other (See Comments) cough    REVIEW OF SYSTEMS:   Review of Systems  Constitutional: Negative for chills and fever.  HENT: Negative for hearing loss and tinnitus.   Eyes: Negative for blurred vision and double vision.  Respiratory: Negative for cough and shortness of breath.   Cardiovascular: Negative for chest pain and palpitations.  Gastrointestinal: Negative for heartburn, nausea and vomiting.  Genitourinary: Negative for dysuria and urgency.    Musculoskeletal: Negative for myalgias and neck pain.  Skin: Negative for itching and rash.  Neurological: Negative for dizziness and headaches.  Psychiatric/Behavioral: Negative for depression and substance abuse.    MEDICATIONS AT HOME:   Prior to Admission medications   Medication Sig Start Date End Date Taking? Authorizing Provider  aspirin 81 MG chewable tablet Chew 1 tablet (81 mg total) by mouth daily. 08/11/17   Fritzi Mandes, MD  atorvastatin (LIPITOR) 40 MG tablet Take 40 mg by mouth daily.     [provider]  clopidogrel (PLAVIX) 75 MG tablet Take 1 tablet (75 mg total) by mouth daily with breakfast. 08/11/17   Fritzi Mandes, MD  famotidine (PEPCID) 20 MG tablet Take 1 tablet (20 mg total) by mouth 2 (two) times daily. 12/26/17   Paulette Blanch, MD  insulin NPH-regular Human (NOVOLIN 70/30) (70-30) 100 UNIT/ML injection Inject 20-24 Units into the skin 2 (two) times daily. Take 24units in the morning and 20units in the evening.    [provider]  metFORMIN (GLUCOPHAGE) 500 MG tablet Take 500 mg by mouth 2 (two) times daily with a meal.    [provider]  metoprolol (LOPRESSOR) 50 MG tablet Take 50 mg by mouth 2 (two) times daily.    [provider]  nitroGLYCERIN (NITROSTAT) 0.4 MG SL tablet Place 0.4 mg under the tongue every 5 (five) minutes as needed for chest pain.    [provider]  pregabalin (LYRICA) 50 MG capsule Take 1 capsule (50 mg total) by mouth 3 (three) times daily. 03/22/19 03/21/20  Earleen Newport, MD  traMADol (ULTRAM) 50 MG tablet Take 1 tablet (50 mg total) by mouth every 6 (six) hours as needed. Patient taking differently: Take 50 mg by mouth every 6 (six) hours as needed for moderate pain.  05/14/15   Loney Hering, MD  venlafaxine XR (EFFEXOR-XR) 150 MG 24 hr capsule Take 150 mg by mouth daily with breakfast.    [provider]      VITAL SIGNS:  Blood pressure (!) 235/100, pulse 87, temperature 98.2  F (36.8 C), temperature source Oral, resp. rate 17, SpO2 94 %.  PHYSICAL EXAMINATION:  Physical Exam  GENERAL:  58 y.o.-year-old patient lying in the bed with no acute distress.  EYES: Pupils equal, round, reactive to [place] and accommodation. No scleral icterus. Extraocular muscles intact.  HEENT: Head atraumatic, normocephalic. Oropharynx and nasopharynx clear.  NECK:  Supple, no jugular venous distention. No thyroid enlargement, no tenderness.  LUNGS: Normal breath sounds bilaterally, no wheezing, rales,rhonchi or crepitation. No use of accessory muscles of respiration.  CARDIOVASCULAR: S1, S2 normal. No murmurs, rubs, or gallops.  ABDOMEN: Soft, nontender, nondistended. Bowel sounds present. No organomegaly or mass.  EXTREMITIES: No pedal edema, cyanosis, or clubbing.  NEUROLOGIC: Cranial nerves II through XII are intact. Muscle strength 5/5 in all extremities. Sensation intact. Gait not checked.  PSYCHIATRIC: The patient is alert and oriented x  3.  SKIN: No obvious rash, lesion, or ulcer.   LABORATORY PANEL:   CBC Recent Labs  Lab 04/01/19 1719  WBC 7.1  HGB 15.4  HCT 43.4  PLT 175   ------------------------------------------------------------------------------------------------------------------  Chemistries  Recent Labs  Lab 04/01/19 1719  NA 135  K 4.4  CL 102  CO2 22  GLUCOSE 264*  BUN 18  CREATININE 1.17  CALCIUM 9.6  AST 100*  ALT 62*  ALKPHOS 101  BILITOT 1.4*   ------------------------------------------------------------------------------------------------------------------  Cardiac Enzymes No results for input(s): TROPONINI in the last 168 hours. ------------------------------------------------------------------------------------------------------------------  RADIOLOGY:  Ct Angio Head W Or Wo Contrast  Result Date: 04/01/2019 CLINICAL DATA:  58 year old male with slurred speech. Unsteadiness and fall this morning. EXAM: CT ANGIOGRAPHY HEAD AND  NECK TECHNIQUE: Multidetector CT imaging of the head and neck was performed using the standard protocol during bolus administration of intravenous contrast. Multiplanar CT image reconstructions and MIPs were obtained to evaluate the vascular anatomy. Carotid stenosis measurements (when applicable) are obtained utilizing NASCET criteria, using the distal internal carotid diameter as the denominator. CONTRAST:  41m OMNIPAQUE IOHEXOL 350 MG/ML SOLN COMPARISON:  Plain head CT 1743 hours today.  CTA chest 08/06/2017. FINDINGS: CTA NECK Skeleton: Flowing osteophytes in the lower cervical spine. No acute osseous abnormality identified. Upper chest: Patchy peribronchial opacity scattered in both upper lobes, mostly ground-glass. At the same time there is no confluent dependent opacity to suggest this is atelectasis. Although there are some atelectatic changes to the central airways. Visible mediastinal lymph nodes are within normal limits. Other neck: No acute findings. Aortic arch: 4 vessel arch configuration, the left vertebral arises directly from the arch. Mild for age arch atherosclerosis. Right carotid system: No brachiocephalic or right CCA origin stenosis despite some plaque. Soft and calcified plaque at the right carotid bifurcation. Stenosis up to 60 % with respect to the distal vessel at the right ICA origin. Tortuous right ICA distal to the bulb with a mildly kinked appearance at the C1-C2 level, no additional stenosis to the skull base. Left carotid system: No left CCA origin stenosis despite plaque. Bulky soft plaque in the ventral left CCA at the level of the larynx without stenosis (series 5, image 58). Soft and calcified plaque at the left ICA origin without stenosis. Mildly tortuous or left ICA. The vessel remains patent to the skull base, but see details of the abnormal left ICA siphon below. Vertebral arteries: No proximal right subclavian artery stenosis despite some plaque. Normal right vertebral  artery origin. Moderate stenosis of the right vertebral V2 segment at the C4-C5 level on series 7, image 134. The vessel remains patent to the skull base without additional stenosis. The left vertebral arises directly from the arch, no stenosis at its origin. As a late entry into the transverse foramen and is mildly dominant, remaining patent at the skull base without stenosis. CTA HEAD Posterior circulation: Bulky calcified plaque of the right vertebral V4 segment which is occluded just beyond the right PICA origin which remains patent on series 7, image 220. A portion of the distal right V4 is reconstituted (image 226). More dominant appearing left vertebral artery with V4 segment plaque and mild to moderate stenosis proximal to the vertebrobasilar junction. The left vertebral supplies the basilar. The basilar is patent with irregularity but no hemodynamically significant stenosis. Patent SCA and PCA origins. Both posterior communicating arteries are present. Severe stenosis of the left PCA P2 segment, which remains patent distally. Right PCA branches are within  normal limits. Anterior circulation: High-grade stenosis of the petrous left ICA at the skull base resulting in a radiographic string sign (series 7, image 224 and series 9, image 160). The vessel remains patent but is highly irregular with continued severe plaque and tandem radiographic string sign the cavernous segment on image 229. Superimposed severe stenosis of the proximal supraclinoid segment. Despite these the left ICA terminus remains patent. Normal left posterior communicating artery origin. Patent left MCA and ACA origins, mildly irregular. Left MCA M1 segment is diminutive but patent. Patent left MCA bifurcation, and no left MCA branch occlusion identified. Proximal right ICA siphon is normal but there is extensive plaque in the cavernous segment with a high-grade stenosis, radiographic string sign on series 7, image 233. Continued severe  plaque through the supraclinoid segment with moderate to severe additional supraclinoid stenosis (image 244). Right posterior communicating artery remains patent. Right ICA terminus, right MCA and ACA origins remain patent. Anterior communicating artery is diminutive or absent. ACA A1 segments and bilateral ACA branches are within normal limits. Right MCA M1 and trifurcation are patent. Right MCA branches are within normal limits. Venous sinuses: Patent. Anatomic variants: Dominant left vertebral artery which arises directly from the arch. Review of the MIP images confirms the above findings IMPRESSION: 1. Negative for large vessel occlusion, but positive for severe intracranial atherosclerosis with RADIOGRAPHIC STRING SIGN stenosis of both ICA siphons, worse on the left. There is a greater degree of soft plaque or thrombus in the left ICA siphon, and also the left CCA. 2. Segmental occlusion of the right vertebral artery V4 segment, distal to the right PICA origin which remains patent. 3. Additional hemodynamically significant stenoses: - right ICA origin (60%). - left vertebral artery V4 segment (up to moderate). - left PCA P2 segment (severe). 4. Nonspecific upper lobe peribronchial ground-glass pulmonary opacity. Recommend correlation with chest radiographs when possible. Preliminary report of the above discussed by telephone with Dr. Blake Divine on 04/01/2019 at 18:11. We discussed follow-up CT Perfusion, versus stat DWI brain MRI. Electronically Signed   By: Genevie Ann M.D.   On: 04/01/2019 18:19   Ct Angio Neck W And/or Wo Contrast  Result Date: 04/01/2019 CLINICAL DATA:  58 year old male with slurred speech. Unsteadiness and fall this morning. EXAM: CT ANGIOGRAPHY HEAD AND NECK TECHNIQUE: Multidetector CT imaging of the head and neck was performed using the standard protocol during bolus administration of intravenous contrast. Multiplanar CT image reconstructions and MIPs were obtained to evaluate the  vascular anatomy. Carotid stenosis measurements (when applicable) are obtained utilizing NASCET criteria, using the distal internal carotid diameter as the denominator. CONTRAST:  21m OMNIPAQUE IOHEXOL 350 MG/ML SOLN COMPARISON:  Plain head CT 1743 hours today.  CTA chest 08/06/2017. FINDINGS: CTA NECK Skeleton: Flowing osteophytes in the lower cervical spine. No acute osseous abnormality identified. Upper chest: Patchy peribronchial opacity scattered in both upper lobes, mostly ground-glass. At the same time there is no confluent dependent opacity to suggest this is atelectasis. Although there are some atelectatic changes to the central airways. Visible mediastinal lymph nodes are within normal limits. Other neck: No acute findings. Aortic arch: 4 vessel arch configuration, the left vertebral arises directly from the arch. Mild for age arch atherosclerosis. Right carotid system: No brachiocephalic or right CCA origin stenosis despite some plaque. Soft and calcified plaque at the right carotid bifurcation. Stenosis up to 60 % with respect to the distal vessel at the right ICA origin. Tortuous right ICA distal to the bulb with  a mildly kinked appearance at the C1-C2 level, no additional stenosis to the skull base. Left carotid system: No left CCA origin stenosis despite plaque. Bulky soft plaque in the ventral left CCA at the level of the larynx without stenosis (series 5, image 58). Soft and calcified plaque at the left ICA origin without stenosis. Mildly tortuous or left ICA. The vessel remains patent to the skull base, but see details of the abnormal left ICA siphon below. Vertebral arteries: No proximal right subclavian artery stenosis despite some plaque. Normal right vertebral artery origin. Moderate stenosis of the right vertebral V2 segment at the C4-C5 level on series 7, image 134. The vessel remains patent to the skull base without additional stenosis. The left vertebral arises directly from the arch, no  stenosis at its origin. As a late entry into the transverse foramen and is mildly dominant, remaining patent at the skull base without stenosis. CTA HEAD Posterior circulation: Bulky calcified plaque of the right vertebral V4 segment which is occluded just beyond the right PICA origin which remains patent on series 7, image 220. A portion of the distal right V4 is reconstituted (image 226). More dominant appearing left vertebral artery with V4 segment plaque and mild to moderate stenosis proximal to the vertebrobasilar junction. The left vertebral supplies the basilar. The basilar is patent with irregularity but no hemodynamically significant stenosis. Patent SCA and PCA origins. Both posterior communicating arteries are present. Severe stenosis of the left PCA P2 segment, which remains patent distally. Right PCA branches are within normal limits. Anterior circulation: High-grade stenosis of the petrous left ICA at the skull base resulting in a radiographic string sign (series 7, image 224 and series 9, image 160). The vessel remains patent but is highly irregular with continued severe plaque and tandem radiographic string sign the cavernous segment on image 229. Superimposed severe stenosis of the proximal supraclinoid segment. Despite these the left ICA terminus remains patent. Normal left posterior communicating artery origin. Patent left MCA and ACA origins, mildly irregular. Left MCA M1 segment is diminutive but patent. Patent left MCA bifurcation, and no left MCA branch occlusion identified. Proximal right ICA siphon is normal but there is extensive plaque in the cavernous segment with a high-grade stenosis, radiographic string sign on series 7, image 233. Continued severe plaque through the supraclinoid segment with moderate to severe additional supraclinoid stenosis (image 244). Right posterior communicating artery remains patent. Right ICA terminus, right MCA and ACA origins remain patent. Anterior  communicating artery is diminutive or absent. ACA A1 segments and bilateral ACA branches are within normal limits. Right MCA M1 and trifurcation are patent. Right MCA branches are within normal limits. Venous sinuses: Patent. Anatomic variants: Dominant left vertebral artery which arises directly from the arch. Review of the MIP images confirms the above findings IMPRESSION: 1. Negative for large vessel occlusion, but positive for severe intracranial atherosclerosis with RADIOGRAPHIC STRING SIGN stenosis of both ICA siphons, worse on the left. There is a greater degree of soft plaque or thrombus in the left ICA siphon, and also the left CCA. 2. Segmental occlusion of the right vertebral artery V4 segment, distal to the right PICA origin which remains patent. 3. Additional hemodynamically significant stenoses: - right ICA origin (60%). - left vertebral artery V4 segment (up to moderate). - left PCA P2 segment (severe). 4. Nonspecific upper lobe peribronchial ground-glass pulmonary opacity. Recommend correlation with chest radiographs when possible. Preliminary report of the above discussed by telephone with Dr. Blake Divine on 04/01/2019  at 18:11. We discussed follow-up CT Perfusion, versus stat DWI brain MRI. Electronically Signed   By: Genevie Ann M.D.   On: 04/01/2019 18:19   Mr Brain Wo Contrast  Result Date: 04/01/2019 CLINICAL DATA:  58 year old male code stroke presentation with slurred speech. CTA head and neck revealing widespread severe intracranial atherosclerosis. EXAM: MRI HEAD WITHOUT CONTRAST TECHNIQUE: Multiplanar, multiecho pulse sequences of the brain and surrounding structures were obtained without intravenous contrast. COMPARISON:  CT head, CTA head and neck earlier today. FINDINGS: Brain: No restricted diffusion or evidence of acute infarction. Mild for age scattered cerebral white matter T2 and FLAIR hyperintensity, most pronounced in the periatrial white matter regions. No cortical  encephalomalacia or chronic blood products identified. No restricted diffusion to suggest acute infarction. No midline shift, mass effect, evidence of mass lesion, ventriculomegaly, extra-axial collection or acute intracranial hemorrhage. Cervicomedullary junction and pituitary are within normal limits. Minor T2 heterogeneity in the deep gray matter nuclei and pons is new since 2016. There are tiny chronic infarcts in the right cerebellum which have not significantly changed on series 14, image 3. Vascular: Major intracranial vascular flow voids are diminished at proximal left ICA siphon, in the distal right vertebral artery corresponding to the CTA findings. Skull and upper cervical spine: Negative visible cervical spine. Visualized bone marrow signal is within normal limits. Sinuses/Orbits: Postoperative changes to the left globe, otherwise negative orbits. Paranasal sinuses are clear. Other: Mild right mastoid effusion. Negative visible nasopharynx. Left mastoids are clear. Scalp and face soft tissues are negative. IMPRESSION: 1. Negative for acute infarct. 2. Loss of the normal vascular flow voids in the proximal left ICA siphon and distal right Vertebral artery corresponding to some of the CTA abnormalities today. 3. 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 Electronically Signed   By: Genevie Ann M.D.   On: 04/01/2019 19:58   Dg Chest Portable 1 View  Result Date: 04/01/2019 CLINICAL DATA:  Falls, unsteady gait, slurred speech EXAM: PORTABLE CHEST 1 VIEW COMPARISON:  03/22/2019 FINDINGS: Increased interstitial markings, chronic. No focal consolidation. No pleural effusion or pneumothorax. Mild cardiomegaly. IMPRESSION: No evidence of acute cardiopulmonary disease. Electronically Signed   By: Julian Hy M.D.   On: 04/01/2019 18:37   Ct Head Code Stroke Wo  Contrast`  Addendum Date: 04/01/2019   ADDENDUM REPORT: 04/01/2019 18:19 ADDENDUM: Study discussed by telephone with Dr. Blake Divine on 04/01/2019 at 1800 hours. Electronically Signed   By: Genevie Ann M.D.   On: 04/01/2019 18:19   Result Date: 04/01/2019 CLINICAL DATA:  Code stroke. 58 year old male with slurred speech this afternoon. Falls and unsteady gait this morning. EXAM: CT HEAD WITHOUT CONTRAST TECHNIQUE: Contiguous axial images were obtained from the base of the skull through the vertex without intravenous contrast. COMPARISON:  Brain MRI 11/06/2014.  Head CT 11/19/2018. FINDINGS: Brain: No midline shift, mass effect, or evidence of intracranial mass lesion. No ventriculomegaly. No acute intracranial hemorrhage identified. Stable gray-white matter differentiation throughout the brain. Minimal to mild for age white matter hypodensity is stable. Small dystrophic calcifications in the deep cerebellar nuclei are stable. Lesser similar basal ganglia calcifications. No cortically based acute infarct identified. Vascular: Extensive Calcified atherosclerosis at the skull base. No suspicious intracranial vascular hyperdensity. Skull: No acute osseous abnormality identified. Sinuses/Orbits: Visualized paranasal sinuses and mastoids are stable and well pneumatized. Other: Stable, negative orbit and scalp  soft tissues. ASPECTS [facility] Stroke Program Early CT Score) Total score (0-10 with 10 being normal): 10 IMPRESSION: 1. No acute intracranial hemorrhage or cortically based infarct identified. ASPECTS 10. 2. Stable and largely negative for age noncontrast CT appearance of the brain. Electronically Signed: By: Genevie Ann M.D. On: 04/01/2019 17:55      IMPRESSION AND PLAN:  Patient is a 58 year old male with history of episodic ataxia for which she follows up at [facility], hypertension and diabetes mellitus admitted for further evaluation of TIA  1.  TIA Patient presented with episode of slurred speech and  discoordination on waking up this morning.  Symptoms already significantly improving.  TPA was not indicated because exact onset of symptoms not clear since patient woke up with symptoms. Patient already seen by telemetry neurologist following code stroke in the emergency room.  Recommended admission for work-up for CVA. MRI of the brain with no acute infarct. Patient does have known history of episodic ataxia for which she follows up with [facility].  CT head and neck with no large vessel occlusion but evidence of severe intracranial atherosclerosis. May consider getting in-house neurologist input in a.m. patient was seen by telemetry neurologist today. Continue aspirin and statins. Follow-up on 2D echocardiogram and carotid Doppler ultrasound ordered. PT and speech therapy evaluation  2.  Diabetes mellitus type 2 Hold off on insulin and metformin for now since patient n.p.o. pending speech evaluation Sliding scale insulin coverage.  Glycosylated hemoglobin level in a.m.  3.  Hypertension Hold off on blood pressure medications tonight to allow for permissive hypertension. Resume meds in a.m. and monitor.  DVT prophylaxis; Lovenox   All the records are reviewed and case discussed with ED provider. Management plans discussed with the patient, and he is in agreement.  CODE STATUS: Full code  TOTAL TIME TAKING CARE OF THIS PATIENT: 61 minutes.    Kuper Rennels M.D on 04/01/2019 at 8:02 PM  Between 7am to 6pm - Pager - [phone]  After 6pm go to [email or web address] - Proofreader  [facility]  Office  [phone]  CC: Primary care physician; Medicine, [place] [facility]   Note: This dictation was prepared with Dragon dictation along with smaller phrase technology. Any transcriptional errors that result from this process are unintentional.

## 2019-04-01 NOTE — Progress Notes (Addendum)
CODE STROKE- PHARMACY COMMUNICATION   Time CODE STROKE called/page received: 5:36 pm   Time response to CODE STROKE was made (in person or via phone): 5:38 pm - last known 8 AM.  Time Stroke Kit retrieved from [facility] (only if needed): N/A - not needed  Name of Provider/Nurse contacted:Paige Wynetta Emery, RN   Past Medical History:  Diagnosis Date  . CHF (congestive heart failure) (HCC)    diastolic (echo at [facility] 1601) EF 55-60%  . Cirrhosis ([place])    NASH per records  . COPD (chronic obstructive pulmonary disease) ([place])   . Depression   . Diabetes mellitus with neuropathy ([place])   . Heart attack ([place])   . Neuropathy   . OSA (obstructive sleep apnea)   . Peripheral vascular disease due to secondary diabetes mellitus ([place])   . Splenomegaly   . 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 TIA (transient ischemic attack) April 2016   Prior to Admission medications   Medication Sig Start Date End Date Taking? Authorizing Provider  aspirin 81 MG chewable tablet Chew 1 tablet (81 mg total) by mouth daily. 08/11/17   Fritzi Mandes, MD  atorvastatin (LIPITOR) 40 MG tablet Take 40 mg by mouth daily.     [provider]  clopidogrel (PLAVIX) 75 MG tablet Take 1 tablet (75 mg total) by mouth daily with breakfast. 08/11/17   Fritzi Mandes, MD  famotidine (PEPCID) 20 MG tablet Take 1 tablet (20 mg total) by mouth 2 (two) times daily. 12/26/17   Paulette Blanch, MD  insulin NPH-regular Human (NOVOLIN 70/30) (70-30) 100 UNIT/ML injection Inject 20-24 Units into the skin 2 (two) times daily. Take 24units in the morning and 20units in the evening.    [provider]  metFORMIN (GLUCOPHAGE) 500 MG tablet Take 500 mg by mouth 2 (two) times daily with a meal.    [provider]  metoprolol (LOPRESSOR) 50 MG tablet Take 50 mg by mouth 2 (two) times daily.    [provider]  nitroGLYCERIN (NITROSTAT) 0.4 MG SL tablet Place 0.4 mg under the tongue every 5 (five) minutes as needed for chest  pain.    [provider]  pregabalin (LYRICA) 50 MG capsule Take 1 capsule (50 mg total) by mouth 3 (three) times daily. 03/22/19 03/21/20  Earleen Newport, MD  traMADol (ULTRAM) 50 MG tablet Take 1 tablet (50 mg total) by mouth every 6 (six) hours as needed. Patient taking differently: Take 50 mg by mouth every 6 (six) hours as needed for moderate pain.  05/14/15   Loney Hering, MD  venlafaxine XR (EFFEXOR-XR) 150 MG 24 hr capsule Take 150 mg by mouth daily with breakfast.    [provider]    Rowland Lathe ,PharmD Clinical Pharmacist  04/01/2019  5:37 PM

## 2019-04-01 NOTE — Consult Note (Addendum)
[facility] Tele[facility]urology Consult Services   Date of Service:   04/01/2019 17:44:56  Impression:     .  Rule Out Acute Ischemic Stroke     .  Versus worsening of episodic ataxia symptoms  Comments/Sign-Out: the patient has multifocal ataxia is a little worse in the left upper extremity and he has slurred speech. 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 He has a chronic level of these symptoms at baseline but it sounds like he had more balance and walking trouble over the past month and significant issues today leading him to seek treatment. He actually became worse last evening and said he was hoping he would "sleep it off"   so LKN over 24 hours ago.  For this reason suspect acute ischemic stroke is less likely although he's quite hypertensive.    It is not clear what treatments he has tried in the past as he is somewhat of a limited historian but it sounds like his doctor was going to pursue getting him a scooter. He may have tried and failed other medications he gets his care at [facility]. Would likely be helpful to try and obtain if possible his records from [facility] to be if his physician had any other suggestions regarding his movement disorder. can also check toxic metabolic labs to ensure that he doesn't have anything that would be precipitating worsening of his symptoms such as a urinary tract infection or other issues  Metrics: Last Known Well: not really clear hes had subacute worsening of his gate for the past month  With a level of these sx at all times he felt worse then usual last night then even worse this am so worsening over 24 hours [facility] Notification Time: 04/01/2019 17:44:56 Arrival Time: 04/01/2019 17:09:00 Stamp Time: 04/01/2019 17:44:56 Time First Login Attempt: 04/01/2019 17:49:00 Video Start Time: 04/01/2019 17:49:00  Symptoms: weak all over NIHSS Start Assessment Time: 04/01/2019 17:53:59 Patient is not a candidate for  Alteplase/Activase. Patient was not deemed candidate for Alteplase/Activase thrombolytics because of patient isn't feeling imbalance for a month consistent with his chronic ataxia diagnosis and the symptoms are within his normal experience when he has a flair. Video End Time: 04/01/2019 18:09:35  CT head showed no acute hemorrhage or acute core infarct.  Clinical Presentation is not Suggestive of Large Vessel Occlusive Disease-sx in keeping with patients known disease.  He also has no clear LKN Not NIR candidate  ED Physician notified of diagnostic impression and management plan on 04/01/2019 18:09:36  Our recommendations are outlined below.  Recommendations:      .  he may continue his home antiplatelet regimen  Routine Consultation with [facility] for Follow up Care  Sign Out:     .  Discussed with Emergency Department Provider    ------------------------------------------------------------------------------  History of Present Illness: Patient is a 58 year old Male.  Patient was brought by EMS for symptoms of weak all over  He said he woke up and felt more unsteadinessand slurred speech then usual. He has a diagnosis of episodic ataxia. he typically does have a level of steady worsening 4 months. He usually sleeps it off and that he would feel better and actually felt worse. He was unable to ambulate safely. Unfortunately his care is at [facility] and he is a difficult historian in terms of what prior medicines have been tried for his condition. He said he just saw his doctor and he didn't recommend adding any medicine  is not clear if he failed multiple options. He says this is how he feels have a flare of his episodic ataxia is not different. he also has a history of obstructive sleep apnea, neuropathy, COPD, depression, cirrhosis, temporal arteritis Denies any   Past Medical History:     . Diabetes Mellitus     . Coronary Artery Disease  Anticoagulant use:   No  Antiplatelet use: plavix  Examination: BP over 425 systolic 1A: Level of Consciousness - Alert; keenly responsive + 0 1B: Ask Month and Age - Both Questions Right + 0 1C: Blink Eyes & Squeeze Hands - Performs Both Tasks + 0 2: Test Horizontal Extraocular Movements - Normal + 0 3: Test Visual Fields - No Visual Loss + 0 4: Test Facial Palsy (Use Grimace if Obtunded) - Normal symmetry + 0 5A: Test Left Arm Motor Drift - No Drift for 10 Seconds + 0 5B: Test Right Arm Motor Drift - No Drift for 10 Seconds + 0 6A: Test Left Leg Motor Drift - No Drift for 5 Seconds + 0 6B: Test Right Leg Motor Drift - No Drift for 5 Seconds + 0 7: Test Limb Ataxia (FNF/Heel-Shin) - Ataxia in 2 Limbs + 2 8: Test Sensation - Normal; No sensory loss + 0 9: Test Language/Aphasia - Normal; No aphasia + 0 10: Test Dysarthria - Mild-Moderate Dysarthria: Slurring but can be understood + 1 11: Test Extinction/Inattention - No abnormality + 0  NIHSS Score: 3  Patient/Family was informed the Neurology Consult would happen via TeleHealth consult by way of interactive audio and video telecommunications and consented to receiving care in this manner.   Due to the immediate potential for life-threatening deterioration due to underlying acute neurologic illness, I spent 35 minutes providing critical care. This time includes time for face to face visit via telemedicine, review of medical records, imaging studies and discussion of findings with providers, the patient and/or family.   Dr Katina Degree   [facility] [phone]   Case [id number]     ADDENDUM: 1. Negative for large vessel occlusion, but positive for severe intracranial atherosclerosis with RADIOGRAPHIC STRING SIGN stenosis of both ICA siphons, worse on the left. There is a greater degree of soft plaque or thrombus in the left ICA siphon, and also the left CCA. 2. 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 3. Additional hemodynamically significant stenoses: - right ICA origin (60%). - left vertebral artery V4 segment (up to moderate). - left PCA P2 segment (severe). 4. Nonspecific upper lobe peribronchial ground-glass pulmonary opacity. Recommend correlation with chest radiographs when possible.  Preliminary report of the above discussed by telephone with Dr. Blake Divine on 04/01/2019 at 18:11. We discussed follow-up CT Perfusion, versus stat DWI brain MRI.  Discussed with ED MD LKN not really in any range for NIR or tpa.  Its unlikely hes having a stroke however given the multiple severe stenosis and possible for superimposed cva on top of his chronic episodic ataxia reasonable to get stat MIR to clarify his diagnosis fully. These may all be chronic findings but difficult to be certain.   He needs to be on high intensity statin if his cirrhosis not contraindicating him and dual antiplatelet therapy.  If MRI showed a stroke would consider consulting NIR in case he further declined but for now not a traditional candidate.  Perfusion imaging will be difficult to interpret given that greatest concern now  wold be for brainstem posterior circulation process which will be poorly imaged on perfusion much better on MRI and also his severe stenosis may effect flow.  If MRI brain shows a cva please reconsult telespecialsits for additional information

## 2019-04-01 NOTE — Progress Notes (Signed)
Notify Dr. Stark Jock about patient's SBP in 200, asked for PRN Medication, Labetalol IV PRN given of SBP is >200., MD's goal for BP is 200. Also per ED RN patient passed bedside swallow study and already ate in the ED, asked if patient can have diet, order given. RN will continue to monitor.

## 2019-04-02 ENCOUNTER — Inpatient Hospital Stay
Admit: 2019-04-02 | Discharge: 2019-04-02 | Disposition: A | Discharge status: Home / Self Care | Payer: Medicare Other | Attending: Internal Medicine | Admitting: Internal Medicine

## 2019-04-02 LAB — GLUCOSE, CAPILLARY
Glucose-Capillary: 234 mg/dL — ABNORMAL HIGH (ref 70–99)
Glucose-Capillary: 370 mg/dL — ABNORMAL HIGH (ref 70–99)
Glucose-Capillary: 216 mg/dL — ABNORMAL HIGH (ref 70–99)
Glucose-Capillary: 340 mg/dL — ABNORMAL HIGH (ref 70–99)
Glucose-Capillary: 277 mg/dL — ABNORMAL HIGH (ref 70–99)

## 2019-04-02 LAB — LIPID PANEL
Cholesterol: 175 mg/dL (ref 0–200)
HDL: 28 mg/dL — ABNORMAL LOW (ref >40)
LDL Cholesterol: UNDETERMINED mg/dL (ref 0–99)
Total CHOL/HDL Ratio: 6.3 RATIO
Triglycerides: 560 mg/dL — ABNORMAL HIGH (ref <150)
VLDL: UNDETERMINED mg/dL (ref 0–40)

## 2019-04-02 LAB — HEMOGLOBIN A1C
Hgb A1c MFr Bld: 7.8 % — ABNORMAL HIGH (ref 4.8–5.6)
Mean Plasma Glucose: 177.16 mg/dL

## 2019-04-02 LAB — SARS CORONAVIRUS 2 (TAT 6-24 HRS): SARS Coronavirus 2: NEGATIVE

## 2019-04-02 LAB — ECHOCARDIOGRAM COMPLETE
Height: 67 in
Weight: 3152 oz

## 2019-04-02 MED ORDER — ALPRAZOLAM 0.25 MG PO TABS
0.2500 mg | ORAL_TABLET | Freq: Three times a day (TID) | ORAL | Status: DC | PRN
  Administered 2019-04-02 (×2): 0.25 mg via ORAL
  Filled 2019-04-02 (×2): qty 1

## 2019-04-02 MED ORDER — CLONIDINE HCL 0.1 MG PO TABS
0.1000 mg | ORAL_TABLET | Freq: Two times a day (BID) | ORAL | Status: DC
  Administered 2019-04-02 – 2019-04-04 (×5): 0.1 mg via ORAL
  Filled 2019-04-02 (×5): qty 1

## 2019-04-02 NOTE — Progress Notes (Signed)
[facility] [place] NAME: Larry Terrell    MR#:  [id number]  DATE OF BIRTH:  08/05/1960  SUBJECTIVE:   Patient presented to the hospital due to slurred speech and unsteady gait and thought to have a TIA/CVA.  Feels a lot better today.  Blood pressures are still somewhat uncontrolled.  No other acute events or complaints presently.  REVIEW OF SYSTEMS:    Review of Systems  Constitutional: Negative for chills and fever.  HENT: Negative for congestion and tinnitus.   Eyes: Negative for blurred vision and double vision.  Respiratory: Negative for cough, shortness of breath and wheezing.   Cardiovascular: Negative for chest pain, orthopnea and PND.  Gastrointestinal: Negative for abdominal pain, diarrhea, nausea and vomiting.  Genitourinary: Negative for dysuria and hematuria.  Neurological: Negative for dizziness, sensory change and focal weakness.  All other systems reviewed and are negative.   Nutrition: Carb Control Tolerating Diet: Yes Tolerating PT: Eval noted.   DRUG ALLERGIES:   Allergies  Allergen Reactions   Ace Inhibitors Cough    Other reaction(s): Other (See Comments) cough    VITALS:  Blood pressure (!) 184/89, pulse 87, temperature 97.6 F (36.4 C), temperature source Oral, resp. rate 18, height 5' 7"  (1.702 m), weight 89.4 kg, SpO2 97 %.  PHYSICAL EXAMINATION:   Physical Exam  GENERAL:  58 y.o.-year-old patient lying in bed in no acute distress.  EYES: Pupils equal, round, reactive to Lesmeister and accommodation. No scleral icterus. Extraocular muscles intact.  HEENT: Head atraumatic, normocephalic. Oropharynx and nasopharynx clear.  NECK:  Supple, no jugular venous distention. No thyroid enlargement, no tenderness.  LUNGS: Normal breath sounds bilaterally, no wheezing, rales, rhonchi. No use of accessory muscles of respiration.  CARDIOVASCULAR: S1, S2 normal. No murmurs, rubs, or gallops.  ABDOMEN: Soft,  nontender, nondistended. Bowel sounds present. No organomegaly or mass.  EXTREMITIES: No cyanosis, clubbing or edema b/l.    NEUROLOGIC: Cranial nerves II through XII are intact. No focal Motor or sensory deficits b/l.   PSYCHIATRIC: The patient is alert and oriented x 3.  SKIN: No obvious rash, lesion, or ulcer.    LABORATORY PANEL:   CBC Recent Labs  Lab 04/01/19 1719  WBC 7.1  HGB 15.4  HCT 43.4  PLT 175   ------------------------------------------------------------------------------------------------------------------  Chemistries  Recent Labs  Lab 04/01/19 1719  NA 135  K 4.4  CL 102  CO2 22  GLUCOSE 264*  BUN 18  CREATININE 1.17  CALCIUM 9.6  AST 100*  ALT 62*  ALKPHOS 101  BILITOT 1.4*   ------------------------------------------------------------------------------------------------------------------  Cardiac Enzymes No results for input(s): TROPONINI in the last 168 hours. ------------------------------------------------------------------------------------------------------------------  RADIOLOGY:  Ct Angio Head W Or Wo Contrast  Result Date: 04/01/2019 CLINICAL DATA:  58 year old male with slurred speech. Unsteadiness and fall this morning. EXAM: CT ANGIOGRAPHY HEAD AND NECK TECHNIQUE: Multidetector CT imaging of the head and neck was performed using the standard protocol during bolus administration of intravenous contrast. Multiplanar CT image reconstructions and MIPs were obtained to evaluate the vascular anatomy. Carotid stenosis measurements (when applicable) are obtained utilizing NASCET criteria, using the distal internal carotid diameter as the denominator. CONTRAST:  70m OMNIPAQUE IOHEXOL 350 MG/ML SOLN COMPARISON:  Plain head CT 1743 hours today.  CTA chest 08/06/2017. FINDINGS: CTA NECK Skeleton: Flowing osteophytes in the lower cervical spine. No acute osseous abnormality identified. Upper chest: Patchy peribronchial opacity scattered in both upper  lobes, mostly ground-glass. At the  same time there is no confluent dependent opacity to suggest this is atelectasis. Although there are some atelectatic changes to the central airways. Visible mediastinal lymph nodes are within normal limits. Other neck: No acute findings. Aortic arch: 4 vessel arch configuration, the left vertebral arises directly from the arch. Mild for age arch atherosclerosis. Right carotid system: No brachiocephalic or right CCA origin stenosis despite some plaque. Soft and calcified plaque at the right carotid bifurcation. Stenosis up to 60 % with respect to the distal vessel at the right ICA origin. Tortuous right ICA distal to the bulb with a mildly kinked appearance at the C1-C2 level, no additional stenosis to the skull base. Left carotid system: No left CCA origin stenosis despite plaque. Bulky soft plaque in the ventral left CCA at the level of the larynx without stenosis (series 5, image 58). Soft and calcified plaque at the left ICA origin without stenosis. Mildly tortuous or left ICA. The vessel remains patent to the skull base, but see details of the abnormal left ICA siphon below. Vertebral arteries: No proximal right subclavian artery stenosis despite some plaque. Normal right vertebral artery origin. Moderate stenosis of the right vertebral V2 segment at the C4-C5 level on series 7, image 134. The vessel remains patent to the skull base without additional stenosis. The left vertebral arises directly from the arch, no stenosis at its origin. As a late entry into the transverse foramen and is mildly dominant, remaining patent at the skull base without stenosis. CTA HEAD Posterior circulation: Bulky calcified plaque of the right vertebral V4 segment which is occluded just beyond the right PICA origin which remains patent on series 7, image 220. A portion of the distal right V4 is reconstituted (image 226). More dominant appearing left vertebral artery with V4 segment plaque and mild  to moderate stenosis proximal to the vertebrobasilar junction. The left vertebral supplies the basilar. The basilar is patent with irregularity but no hemodynamically significant stenosis. Patent SCA and PCA origins. Both posterior communicating arteries are present. Severe stenosis of the left PCA P2 segment, which remains patent distally. Right PCA branches are within normal limits. Anterior circulation: High-grade stenosis of the petrous left ICA at the skull base resulting in a radiographic string sign (series 7, image 224 and series 9, image 160). The vessel remains patent but is highly irregular with continued severe plaque and tandem radiographic string sign the cavernous segment on image 229. Superimposed severe stenosis of the proximal supraclinoid segment. Despite these the left ICA terminus remains patent. Normal left posterior communicating artery origin. Patent left MCA and ACA origins, mildly irregular. Left MCA M1 segment is diminutive but patent. Patent left MCA bifurcation, and no left MCA branch occlusion identified. Proximal right ICA siphon is normal but there is extensive plaque in the cavernous segment with a high-grade stenosis, radiographic string sign on series 7, image 233. Continued severe plaque through the supraclinoid segment with moderate to severe additional supraclinoid stenosis (image 244). Right posterior communicating artery remains patent. Right ICA terminus, right MCA and ACA origins remain patent. Anterior communicating artery is diminutive or absent. ACA A1 segments and bilateral ACA branches are within normal limits. Right MCA M1 and trifurcation are patent. Right MCA branches are within normal limits. Venous sinuses: Patent. Anatomic variants: Dominant left vertebral artery which arises directly from the arch. Review of the MIP images confirms the above findings IMPRESSION: 1. Negative for large vessel occlusion, but positive for severe intracranial atherosclerosis with  RADIOGRAPHIC STRING SIGN stenosis  of both ICA siphons, worse on the left. There is a greater degree of soft plaque or thrombus in the left ICA siphon, and also the left CCA. 2. Segmental occlusion of the right vertebral artery V4 segment, distal to the right PICA origin which remains patent. 3. Additional hemodynamically significant stenoses: - right ICA origin (60%). - left vertebral artery V4 segment (up to moderate). - left PCA P2 segment (severe). 4. Nonspecific upper lobe peribronchial ground-glass pulmonary opacity. Recommend correlation with chest radiographs when possible. Preliminary report of the above discussed by telephone with Dr. Blake Divine on 04/01/2019 at 18:11. We discussed follow-up CT Perfusion, versus stat DWI brain MRI. Electronically Signed   By: Genevie Ann M.D.   On: 04/01/2019 18:19   Ct Angio Neck W And/or Wo Contrast  Result Date: 04/01/2019 CLINICAL DATA:  58 year old male with slurred speech. Unsteadiness and fall this morning. EXAM: CT ANGIOGRAPHY HEAD AND NECK TECHNIQUE: Multidetector CT imaging of the head and neck was performed using the standard protocol during bolus administration of intravenous contrast. Multiplanar CT image reconstructions and MIPs were obtained to evaluate the vascular anatomy. Carotid stenosis measurements (when applicable) are obtained utilizing NASCET criteria, using the distal internal carotid diameter as the denominator. CONTRAST:  65m OMNIPAQUE IOHEXOL 350 MG/ML SOLN COMPARISON:  Plain head CT 1743 hours today.  CTA chest 08/06/2017. FINDINGS: CTA NECK Skeleton: Flowing osteophytes in the lower cervical spine. No acute osseous abnormality identified. Upper chest: Patchy peribronchial opacity scattered in both upper lobes, mostly ground-glass. At the same time there is no confluent dependent opacity to suggest this is atelectasis. Although there are some atelectatic changes to the central airways. Visible mediastinal lymph nodes are within normal  limits. Other neck: No acute findings. Aortic arch: 4 vessel arch configuration, the left vertebral arises directly from the arch. Mild for age arch atherosclerosis. Right carotid system: No brachiocephalic or right CCA origin stenosis despite some plaque. Soft and calcified plaque at the right carotid bifurcation. Stenosis up to 60 % with respect to the distal vessel at the right ICA origin. Tortuous right ICA distal to the bulb with a mildly kinked appearance at the C1-C2 level, no additional stenosis to the skull base. Left carotid system: No left CCA origin stenosis despite plaque. Bulky soft plaque in the ventral left CCA at the level of the larynx without stenosis (series 5, image 58). Soft and calcified plaque at the left ICA origin without stenosis. Mildly tortuous or left ICA. The vessel remains patent to the skull base, but see details of the abnormal left ICA siphon below. Vertebral arteries: No proximal right subclavian artery stenosis despite some plaque. Normal right vertebral artery origin. Moderate stenosis of the right vertebral V2 segment at the C4-C5 level on series 7, image 134. The vessel remains patent to the skull base without additional stenosis. The left vertebral arises directly from the arch, no stenosis at its origin. As a late entry into the transverse foramen and is mildly dominant, remaining patent at the skull base without stenosis. CTA HEAD Posterior circulation: Bulky calcified plaque of the right vertebral V4 segment which is occluded just beyond the right PICA origin which remains patent on series 7, image 220. A portion of the distal right V4 is reconstituted (image 226). More dominant appearing left vertebral artery with V4 segment plaque and mild to moderate stenosis proximal to the vertebrobasilar junction. The left vertebral supplies the basilar. The basilar is patent with irregularity but no hemodynamically significant stenosis. Patent SCA  and PCA origins. Both posterior  communicating arteries are present. Severe stenosis of the left PCA P2 segment, which remains patent distally. Right PCA branches are within normal limits. Anterior circulation: High-grade stenosis of the petrous left ICA at the skull base resulting in a radiographic string sign (series 7, image 224 and series 9, image 160). The vessel remains patent but is highly irregular with continued severe plaque and tandem radiographic string sign the cavernous segment on image 229. Superimposed severe stenosis of the proximal supraclinoid segment. Despite these the left ICA terminus remains patent. Normal left posterior communicating artery origin. Patent left MCA and ACA origins, mildly irregular. Left MCA M1 segment is diminutive but patent. Patent left MCA bifurcation, and no left MCA branch occlusion identified. Proximal right ICA siphon is normal but there is extensive plaque in the cavernous segment with a high-grade stenosis, radiographic string sign on series 7, image 233. Continued severe plaque through the supraclinoid segment with moderate to severe additional supraclinoid stenosis (image 244). Right posterior communicating artery remains patent. Right ICA terminus, right MCA and ACA origins remain patent. Anterior communicating artery is diminutive or absent. ACA A1 segments and bilateral ACA branches are within normal limits. Right MCA M1 and trifurcation are patent. Right MCA branches are within normal limits. Venous sinuses: Patent. Anatomic variants: Dominant left vertebral artery which arises directly from the arch. Review of the MIP images confirms the above findings IMPRESSION: 1. Negative for large vessel occlusion, but positive for severe intracranial atherosclerosis with RADIOGRAPHIC STRING SIGN stenosis of both ICA siphons, worse on the left. There is a greater degree of soft plaque or thrombus in the left ICA siphon, and also the left CCA. 2. Segmental occlusion of the right vertebral artery V4  segment, distal to the right PICA origin which remains patent. 3. Additional hemodynamically significant stenoses: - right ICA origin (60%). - left vertebral artery V4 segment (up to moderate). - left PCA P2 segment (severe). 4. Nonspecific upper lobe peribronchial ground-glass pulmonary opacity. Recommend correlation with chest radiographs when possible. Preliminary report of the above discussed by telephone with Dr. Blake Divine on 04/01/2019 at 18:11. We discussed follow-up CT Perfusion, versus stat DWI brain MRI. Electronically Signed   By: Genevie Ann M.D.   On: 04/01/2019 18:19   Mr Brain Wo Contrast  Result Date: 04/01/2019 CLINICAL DATA:  58 year old male code stroke presentation with slurred speech. CTA head and neck revealing widespread severe intracranial atherosclerosis. EXAM: MRI HEAD WITHOUT CONTRAST TECHNIQUE: Multiplanar, multiecho pulse sequences of the brain and surrounding structures were obtained without intravenous contrast. COMPARISON:  CT head, CTA head and neck earlier today. FINDINGS: Brain: No restricted diffusion or evidence of acute infarction. Mild for age scattered cerebral white matter T2 and FLAIR hyperintensity, most pronounced in the periatrial white matter regions. No cortical encephalomalacia or chronic blood products identified. No restricted diffusion to suggest acute infarction. No midline shift, mass effect, evidence of mass lesion, ventriculomegaly, extra-axial collection or acute intracranial hemorrhage. Cervicomedullary junction and pituitary are within normal limits. Minor T2 heterogeneity in the deep gray matter nuclei and pons is new since 2016. There are tiny chronic infarcts in the right cerebellum which have not significantly changed on series 14, image 3. Vascular: Major intracranial vascular flow voids are diminished at proximal left ICA siphon, in the distal right vertebral artery corresponding to the CTA findings. Skull and upper cervical spine: Negative visible  cervical spine. Visualized bone marrow signal is within normal limits. Sinuses/Orbits: Postoperative changes to the  left globe, otherwise negative orbits. Paranasal sinuses are clear. Other: Mild right mastoid effusion. Negative visible nasopharynx. Left mastoids are clear. Scalp and face soft tissues are negative. IMPRESSION: 1. Negative for acute infarct. 2. Loss of the normal vascular flow voids in the proximal left ICA siphon and distal right Vertebral artery corresponding to some of the CTA abnormalities today. 3. 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 Electronically Signed   By: Genevie Ann M.D.   On: 04/01/2019 19:58   Dg Chest Portable 1 View  Result Date: 04/01/2019 CLINICAL DATA:  Falls, unsteady gait, slurred speech EXAM: PORTABLE CHEST 1 VIEW COMPARISON:  03/22/2019 FINDINGS: Increased interstitial markings, chronic. No focal consolidation. No pleural effusion or pneumothorax. Mild cardiomegaly. IMPRESSION: No evidence of acute cardiopulmonary disease. Electronically Signed   By: Julian Hy M.D.   On: 04/01/2019 18:37   Ct Head Code Stroke Wo Contrast`  Addendum Date: 04/01/2019   ADDENDUM REPORT: 04/01/2019 18:19 ADDENDUM: Study discussed by telephone with Dr. Blake Divine on 04/01/2019 at 1800 hours. Electronically Signed   By: Genevie Ann M.D.   On: 04/01/2019 18:19   Result Date: 04/01/2019 CLINICAL DATA:  Code stroke. 58 year old male with slurred speech this afternoon. Falls and unsteady gait this morning. EXAM: CT HEAD WITHOUT CONTRAST TECHNIQUE: Contiguous axial images were obtained from the base of the skull through the vertex without intravenous contrast. COMPARISON:  Brain MRI 11/06/2014.  Head CT 11/19/2018. FINDINGS: Brain: No midline shift, mass effect, or evidence of intracranial mass lesion. No ventriculomegaly. No acute  intracranial hemorrhage identified. Stable gray-white matter differentiation throughout the brain. Minimal to mild for age white matter hypodensity is stable. Small dystrophic calcifications in the deep cerebellar nuclei are stable. Lesser similar basal ganglia calcifications. No cortically based acute infarct identified. Vascular: Extensive Calcified atherosclerosis at the skull base. No suspicious intracranial vascular hyperdensity. Skull: No acute osseous abnormality identified. Sinuses/Orbits: Visualized paranasal sinuses and mastoids are stable and well pneumatized. Other: Stable, negative orbit and scalp soft tissues. ASPECTS [facility] Stroke Program Early CT Score) Total score (0-10 with 10 being normal): 10 IMPRESSION: 1. No acute intracranial hemorrhage or cortically based infarct identified. ASPECTS 10. 2. Stable and largely negative for age noncontrast CT appearance of the brain. Electronically Signed: By: Genevie Ann M.D. 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  TIA Patient presented with episode of slurred speech and discoordination on waking up this morning.   -Symptoms have significantly improved since admission.  He denies any worsening slurred speech or ataxia presently.  TPA was not indicated because exact onset of symptoms not clear since patient woke up with symptoms. MRI of the brain with no acute infarct. Patient does have known history of episodic ataxia for which he follows up with [facility].  CT head and neck with no large vessel occlusion but evidence of severe intracranial atherosclerosis. -Discussed with neurology and based on the CT of the head & neck no plans for acute intervention presently as these are chronic findings.  -Continue dual antiplatelet therapy with aspirin, Plavix.  Await echocardiogram  results. -Appreciate PT and OT evaluation.  2.  Diabetes mellitus type 2 - Metformin, continue sliding scale  insulin for now.  Follow blood sugars which are currently stable.  3.  Hypertension-accelerated. -Tolerate some element of hypertension given patient's intracranial stenosis as mentioned above. - Continue metoprolol, will add some low-dose clonidine.  -Goal systolic blood pressure should be around 140-160.    4.  Anxiety-continue Xanax as needed.  Once blood pressures somewhat improved plan for discharge in next 24 hours.  All the records are reviewed and case discussed with Care Management/Social Worker. Management plans discussed with the patient, family and they are in agreement.  CODE STATUS: Full code  DVT Prophylaxis: Lovenox  TOTAL TIME TAKING CARE OF THIS PATIENT: 30 minutes.   POSSIBLE D/C IN 1-2 DAYS, DEPENDING ON CLINICAL CONDITION.   Henreitta Leber M.D on 04/02/2019 at 1:29 PM  Between 7am to 6pm - Pager - [phone]  After 6pm go to [email or web address] - [employer] [facility]  Office  [phone]  CC: Primary care physician; Medicine, [place]

## 2019-04-02 NOTE — Consult Note (Addendum)
Reason for Consult:garbled speech, incoordination and gait imbalance Referring Physician: Hospitalist  CC: garbled speech, incoordination and gait imbalance  Patient  is a limited  historian, HPI per patient and EMR notes  HPI: Larry Terrell is an 58 y.o. male with extensive medical history including episodic ataxia, diabetic neuropathy, Cad s/p stents, PVD s/o aortoiliac bypass, HTN, sleep apnea, previous TIAs, cirrhosis, corneal transplant, COPD, reccurent major depressive disorder, pulmonary nodule, admitted on the account of garbled speech, gait imbalance and uncoordination. Symptoms started yesterday AM when he woke up and while making breakfast when he felt unsteady, uncoordinated with slurred/garbled speech. Patient states that he has episodic ataxia with parosysmal spells consisting of speech disturbances,  incoordination and gait imbalance since diagnosed by age 71. Usually spell will resolve by rest and sleeping. Spell lasts about 2- to 30 mn.  Patient went to sleep but again woke up with garbled speech, incoordination/gait imbalance. Persistence of the symptoms prompted the visit to ER. Patient evaluated in ER by tele neurologist. Head ct No acute intracranial hemorrhage or cortically based infarctidentified. ASPECTS 10.Stable and largely negative for age noncontrast CT appearance of the brain. CTA Negative for large vessel occlusion, but positive for severe intracranial atherosclerosis with RADIOGRAPHIC STRING SIGN stenosis of both ICA siphons, worse on the left. There is a greater degree of soft plaque or thrombus in the left ICA siphon, and also the left CCA. Segmental occlusion of the right vertebral artery V4 segment,distal to the right PICA origin which remains patent.Additional hemodynamically significant stenoses: right ICA origin (60%).left vertebral artery V4 segment (up to moderate).left PCA P2 segment (severe). Nonspecific upper lobe peribronchial ground-glass pulmonary opacity.  Recommend correlation with chest radiographs when possible. Patient deemed not a candidate for tpa/NIR intervention. Patient bP noted to be in high 200s. Labs per EMR  Patient had MRI Negative for acute infarct.Loss of the normal vascular flow voids in the proximal left ICA siphon and distal right Vertebral artery corresponding to some of the CTA abnormalities today.Small chronic infarcts in the right cerebellum, otherwise mild for age signal changes in the brain which are nonspecific but probably small vessel disease related in this clinical setting.  This AM patient is resting comfortably in bed, having breakfast feeling stronger than day of admission. 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 Reviewing notes from previous provider and patient: patient has h/o of paroxysmal events and spells with loss of coordination, impaired speech and gait imbalance that usually resolves with rest. spells lasts about 20 to 30 mn. Patient does report having residual impairment with speech disturbances ( dysarthria and slurred speech) and gait imbalance. Symptoms triggered by stress, watching movies, fatigue. Per notes . Patient mother known to have dropping spells, his maternal uncles, maternal grand father known to have the same spells Patient had previous extensive work up including genetic consult, MRIs, EEGs, sleep studies   Past Medical History:  Diagnosis Date  . CHF (congestive heart failure) (HCC)    diastolic (echo at [facility] 8032) EF 55-60%  . Cirrhosis ([place])    NASH per records  . COPD (chronic obstructive pulmonary disease) ([place])   . Depression   . Diabetes mellitus with neuropathy ([place])   . Heart attack ([place])   . Neuropathy   . OSA (obstructive sleep apnea)   . Peripheral vascular disease due to secondary diabetes mellitus (  [place])   . Splenomegaly   . 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 TIA (transient ischemic attack) April 2016    Past Surgical History:  Procedure Laterality Date  . AORTA - FEMORAL ARTERY BYPASS GRAFT Bilateral   . CARDIAC CATHETERIZATION    . cardiac stents  July 2015   . CORONARY STENT INTERVENTION N/A 08/09/2017   Procedure: CORONARY STENT INTERVENTION;  Surgeon: Wellington [place], MD;  Location: [place] [facility];  Service: Cardiovascular;  Laterality: N/A;  . LAPAROSCOPIC APPENDECTOMY N/A 07/10/2015   Procedure: APPENDECTOMY LAPAROSCOPIC;  Surgeon: Rolm Bookbinder, MD;  Location: [place];  Service: General;  Laterality: N/A;  . LEFT HEART CATH AND CORONARY ANGIOGRAPHY N/A 08/09/2017   Procedure: LEFT HEART CATH AND CORONARY ANGIOGRAPHY;  Surgeon: Wellington [place], MD;  Location: [place] [facility];  Service: Cardiovascular;  Laterality: N/A;    Family History  Problem Relation Age of Onset  . Heart attack Mother 41  . Stroke Mother   . Heart attack Father   . Alzheimer's disease Father     Social History:  reports that he quit smoking about 11 years ago. His smoking use included cigarettes. He has never used smokeless tobacco. He reports that he does not drink alcohol or use drugs.  Allergies  Allergen Reactions  . Ace Inhibitors Cough    Other reaction(s): Other (See Comments) cough    Medications: I have reviewed the patient's current medications.  ROS: C/o slight lightheadeness, no HA, no visual disturbances Physical Examination: Blood pressure (!) 184/89, pulse 87, temperature 97.6 F (36.4 C), temperature source Oral, resp. rate 18, height 5' 7"  (1.702 m), weight 89.4 kg, SpO2 97 %.  Neurologic Examination Alert, awake, speech slightly dysrathric, follows commands, oriented x3 PERLA, EOMI, VFF, has a b/l ptosis more marked on the left ptosis (patient states that is old since his corneal transplant surgery), face is symmetrical , face sensation is nle to [place] touch, uvula/tongue is midline He 4/5 in all  extremities No sensory deficit appreciated He does have a finger to nose dysmetria ( he states that been there as well as heel to shin ( he thinks that he is better than yesterday) DTR and gait not checked Results for orders placed or performed during the hospital encounter of 04/01/19 (from the past 48 hour(s))  Protime-INR     Status: None   Collection Time: 04/01/19  5:19 PM  Result Value Ref Range   Prothrombin Time 13.2 11.4 - 15.2 seconds   INR 1.0 0.8 - 1.2    Comment: (NOTE) INR goal varies based on device and disease states. Performed at [facility], [address] [place], [place] [address]   APTT     Status: None   Collection Time: 04/01/19  5:19 PM  Result Value Ref Range   aPTT 27 24 - 36 seconds    Comment: Performed at [facility], [address] [place], [place] [address]  CBC     Status: None   Collection Time: 04/01/19  5:19 PM  Result Value Ref Range   WBC 7.1 4.0 - 10.5 K/uL   RBC 4.85 4.22 - 5.81 MIL/uL   Hemoglobin 15.4 13.0 - 17.0 g/dL   HCT 43.4 39.0 - 52.0 %   MCV 89.5 80.0 - 100.0 fL   MCH 31.8 26.0 - 34.0 pg   MCHC 35.5 30.0 - 36.0 g/dL   RDW 14.7 11.5 - 15.5 %   Platelets 175  150 - 400 K/uL   nRBC 0.0 0.0 - 0.2 %    Comment: Performed at [facility], [address] [place], [place] [address]  Differential     Status: None   Collection Time: 04/01/19  5:19 PM  Result Value Ref Range   Neutrophils Relative % 68 %   Neutro Abs 4.9 1.7 - 7.7 K/uL   Lymphocytes Relative 19 %   Lymphs Abs 1.3 0.7 - 4.0 K/uL   Monocytes Relative 7 %   Monocytes Absolute 0.5 0.1 - 1.0 K/uL   Eosinophils Relative 5 %   Eosinophils Absolute 0.3 0.0 - 0.5 K/uL   Basophils Relative 1 %   Basophils Absolute 0.0 0.0 - 0.1 K/uL   Immature Granulocytes 0 %   Abs Immature Granulocytes 0.02 0.00 - 0.07 K/uL    Comment: Performed at [facility], [address] [place], [place] [address]  Comprehensive metabolic panel      Status: Abnormal   Collection Time: 04/01/19  5:19 PM  Result Value Ref Range   Sodium 135 135 - 145 mmol/L   Potassium 4.4 3.5 - 5.1 mmol/L    Comment: HEMOLYSIS AT THIS LEVEL MAY AFFECT RESULT   Chloride 102 98 - 111 mmol/L   CO2 22 22 - 32 mmol/L   Glucose, Bld 264 (H) 70 - 99 mg/dL   BUN 18 6 - 20 mg/dL   Creatinine, Ser 1.17 0.61 - 1.24 mg/dL   Calcium 9.6 8.9 - 10.3 mg/dL   Total Protein 7.2 6.5 - 8.1 g/dL   Albumin 3.6 3.5 - 5.0 g/dL   AST 100 (H) 15 - 41 U/L    Comment: HEMOLYSIS AT THIS LEVEL MAY AFFECT RESULT   ALT 62 (H) 0 - 44 U/L    Comment: HEMOLYSIS AT THIS LEVEL MAY AFFECT RESULT   Alkaline Phosphatase 101 38 - 126 U/L   Total Bilirubin 1.4 (H) 0.3 - 1.2 mg/dL    Comment: HEMOLYSIS AT THIS LEVEL MAY AFFECT RESULT   GFR calc non Af Amer >60 >60 mL/min   GFR calc Af Amer >60 >60 mL/min   Anion gap 11 5 - 15    Comment: Performed at [facility], [address] [place], [place] [address]  Ethanol     Status: None   Collection Time: 04/01/19  5:19 PM  Result Value Ref Range   Alcohol, Ethyl (B) <10 <10 mg/dL    Comment: (NOTE) Lowest detectable limit for serum alcohol is 10 mg/dL. For medical purposes only. Performed at [facility], [place], [place] [address]   Troponin I (High Sensitivity)     Status: Abnormal   Collection Time: 04/01/19  5:19 PM  Result Value Ref Range   Troponin I (High Sensitivity) 29 (H) <18 ng/L    Comment: (NOTE) Elevated high sensitivity troponin I (hsTnI) values and significant  changes across serial measurements may suggest ACS but many other  chronic and acute conditions are known to elevate hsTnI results.  Refer to the "Links" section for chest pain algorithms and additional  guidance. Performed at [facility], [place], [place] [address]   SARS CORONAVIRUS 2 (TAT 6-24 HRS) Nasopharyngeal Nasopharyngeal Swab     Status: None   Collection Time: 04/01/19  6:55 PM    Specimen: Nasopharyngeal Swab  Result Value Ref Range   SARS Coronavirus 2 NEGATIVE NEGATIVE    Comment: (NOTE) SARS-CoV-2 target nucleic acids are NOT DETECTED.  The SARS-CoV-2 RNA is generally detectable in upper and lower respiratory specimens during the acute phase of infection. Negative results do not preclude SARS-CoV-2 infection, do not rule out co-infections with other pathogens, and should not be used as the sole basis for treatment or other patient management decisions. Negative results must be combined with clinical observations, patient history, and epidemiological information. The expected result is Negative. Fact Sheet for Patients: SugarRoll.be Fact Sheet for Healthcare Providers: https://www.woods-mathews.com/ This test is not yet approved or cleared by the [place] FDA and  has been authorized for detection and/or diagnosis of SARS-CoV-2 by FDA under an Emergency Use Authorization (EUA). This EUA will remain  in effect (meaning this test can be used) for the duration of the COVID-19 declaration under Section 56 4(b)(1) of the Act, 21 U.S.C. section 360bbb-3(b)(1), unless the authorization is terminated or revoked sooner. Performed at [facility], [place] [address], [place], [place] [address]   Glucose, capillary     Status: Abnormal   Collection Time: 04/01/19 10:02 PM  Result Value Ref Range   Glucose-Capillary 326 (H) 70 - 99 mg/dL  Troponin I (High Sensitivity)     Status: Abnormal   Collection Time: 04/01/19 10:24 PM  Result Value Ref Range   Troponin I (High Sensitivity) 39 (H) <18 ng/L    Comment: (NOTE) Elevated high sensitivity troponin I (hsTnI) values and significant  changes across serial measurements may suggest ACS but many other  chronic and acute conditions are known to elevate hsTnI results.  Refer to the "Links" section for chest pain algorithms and additional  guidance. Performed at [facility], [address] [place], [place] [address]   Lipid panel     Status: Abnormal   Collection Time: 04/02/19  5:18 AM  Result Value Ref Range   Cholesterol 175 0 - 200 mg/dL   Triglycerides 560 (H) <150 mg/dL   HDL 28 (L) >40 mg/dL   Total CHOL/HDL Ratio 6.3 RATIO   VLDL UNABLE TO CALCULATE IF TRIGLYCERIDE OVER 400 mg/dL 0 - 40 mg/dL   LDL Cholesterol UNABLE TO CALCULATE IF TRIGLYCERIDE OVER 400 mg/dL 0 - 99 mg/dL    Comment:        Total Cholesterol/HDL:CHD Risk Coronary Heart Disease Risk Table                     Men   Women  1/2 Average Risk   3.4   3.3  Average Risk       5.0   4.4  2 X Average Risk   9.6   7.1  3 X Average Risk  23.4   11.0        Use the calculated Patient Ratio above and the CHD Risk Table to determine the patient's CHD Risk.        ATP III CLASSIFICATION (LDL):  <100     mg/dL   Optimal  100-129  mg/dL   Near or Above                    Optimal  130-159  mg/dL   Borderline  160-189  mg/dL   High  >190     mg/dL   Very High Performed at [facility], [address] [place], [place] [address]   Glucose, capillary     Status: Abnormal   Collection Time: 04/02/19  8:04 AM  Result Value Ref Range   Glucose-Capillary 234 (H) 70 -  99 mg/dL    Recent Results (from the past 240 hour(s))  SARS CORONAVIRUS 2 (TAT 6-24 HRS) Nasopharyngeal Nasopharyngeal Swab     Status: None   Collection Time: 04/01/19  6:55 PM   Specimen: Nasopharyngeal Swab  Result Value Ref Range Status   SARS Coronavirus 2 NEGATIVE NEGATIVE Final    Comment: (NOTE) SARS-CoV-2 target nucleic acids are NOT DETECTED. The SARS-CoV-2 RNA is generally detectable in upper and lower respiratory specimens during the acute phase of infection. Negative results do not preclude SARS-CoV-2 infection, do not rule out co-infections with other pathogens, and should not be used as the sole basis for treatment or other patient management decisions. Negative results must be  combined with clinical observations, patient history, and epidemiological information. The expected result is Negative. Fact Sheet for Patients: SugarRoll.be Fact Sheet for Healthcare Providers: https://www.woods-mathews.com/ This test is not yet approved or cleared by the [place] FDA and  has been authorized for detection and/or diagnosis of SARS-CoV-2 by FDA under an Emergency Use Authorization (EUA). This EUA will remain  in effect (meaning this test can be used) for the duration of the COVID-19 declaration under Section 56 4(b)(1) of the Act, 21 U.S.C. section 360bbb-3(b)(1), unless the authorization is terminated or revoked sooner. Performed at [facility], [place] [address], [place], [place] [address]     Ct Angio Head W Or Wo Contrast  Result Date: 04/01/2019 CLINICAL DATA:  58 year old male with slurred speech. Unsteadiness and fall this morning. EXAM: CT ANGIOGRAPHY HEAD AND NECK TECHNIQUE: Multidetector CT imaging of the head and neck was performed using the standard protocol during bolus administration of intravenous contrast. Multiplanar CT image reconstructions and MIPs were obtained to evaluate the vascular anatomy. Carotid stenosis measurements (when applicable) are obtained utilizing NASCET criteria, using the distal internal carotid diameter as the denominator. CONTRAST:  53m OMNIPAQUE IOHEXOL 350 MG/ML SOLN COMPARISON:  Plain head CT 1743 hours today.  CTA chest 08/06/2017. FINDINGS: CTA NECK Skeleton: Flowing osteophytes in the lower cervical spine. No acute osseous abnormality identified. Upper chest: Patchy peribronchial opacity scattered in both upper lobes, mostly ground-glass. At the same time there is no confluent dependent opacity to suggest this is atelectasis. Although there are some atelectatic changes to the central airways. Visible mediastinal lymph nodes are within normal limits. Other neck: No acute findings.  Aortic arch: 4 vessel arch configuration, the left vertebral arises directly from the arch. Mild for age arch atherosclerosis. Right carotid system: No brachiocephalic or right CCA origin stenosis despite some plaque. Soft and calcified plaque at the right carotid bifurcation. Stenosis up to 60 % with respect to the distal vessel at the right ICA origin. Tortuous right ICA distal to the bulb with a mildly kinked appearance at the C1-C2 level, no additional stenosis to the skull base. Left carotid system: No left CCA origin stenosis despite plaque. Bulky soft plaque in the ventral left CCA at the level of the larynx without stenosis (series 5, image 58). Soft and calcified plaque at the left ICA origin without stenosis. Mildly tortuous or left ICA. The vessel remains patent to the skull base, but see details of the abnormal left ICA siphon below. Vertebral arteries: No proximal right subclavian artery stenosis despite some plaque. Normal right vertebral artery origin. Moderate stenosis of the right vertebral V2 segment at the C4-C5 level on series 7, image 134. The vessel remains patent to the skull base without additional stenosis. The left vertebral arises directly from the arch,  no stenosis at its origin. As a late entry into the transverse foramen and is mildly dominant, remaining patent at the skull base without stenosis. CTA HEAD Posterior circulation: Bulky calcified plaque of the right vertebral V4 segment which is occluded just beyond the right PICA origin which remains patent on series 7, image 220. A portion of the distal right V4 is reconstituted (image 226). More dominant appearing left vertebral artery with V4 segment plaque and mild to moderate stenosis proximal to the vertebrobasilar junction. The left vertebral supplies the basilar. The basilar is patent with irregularity but no hemodynamically significant stenosis. Patent SCA and PCA origins. Both posterior communicating arteries are present. Severe  stenosis of the left PCA P2 segment, which remains patent distally. Right PCA branches are within normal limits. Anterior circulation: High-grade stenosis of the petrous left ICA at the skull base resulting in a radiographic string sign (series 7, image 224 and series 9, image 160). The vessel remains patent but is highly irregular with continued severe plaque and tandem radiographic string sign the cavernous segment on image 229. Superimposed severe stenosis of the proximal supraclinoid segment. Despite these the left ICA terminus remains patent. Normal left posterior communicating artery origin. Patent left MCA and ACA origins, mildly irregular. Left MCA M1 segment is diminutive but patent. Patent left MCA bifurcation, and no left MCA branch occlusion identified. Proximal right ICA siphon is normal but there is extensive plaque in the cavernous segment with a high-grade stenosis, radiographic string sign on series 7, image 233. Continued severe plaque through the supraclinoid segment with moderate to severe additional supraclinoid stenosis (image 244). Right posterior communicating artery remains patent. Right ICA terminus, right MCA and ACA origins remain patent. Anterior communicating artery is diminutive or absent. ACA A1 segments and bilateral ACA branches are within normal limits. Right MCA M1 and trifurcation are patent. Right MCA branches are within normal limits. Venous sinuses: Patent. Anatomic variants: Dominant left vertebral artery which arises directly from the arch. Review of the MIP images confirms the above findings IMPRESSION: 1. Negative for large vessel occlusion, but positive for severe intracranial atherosclerosis with RADIOGRAPHIC STRING SIGN stenosis of both ICA siphons, worse on the left. There is a greater degree of soft plaque or thrombus in the left ICA siphon, and also the left CCA. 2. Segmental occlusion of the right vertebral artery V4 segment, distal to the right PICA origin which  remains patent. 3. Additional hemodynamically significant stenoses: - right ICA origin (60%). - left vertebral artery V4 segment (up to moderate). - left PCA P2 segment (severe). 4. Nonspecific upper lobe peribronchial ground-glass pulmonary opacity. Recommend correlation with chest radiographs when possible. Preliminary report of the above discussed by telephone with Dr. Blake Divine on 04/01/2019 at 18:11. We discussed follow-up CT Perfusion, versus stat DWI brain MRI. Electronically Signed   By: Genevie Ann M.D.   On: 04/01/2019 18:19   Ct Angio Neck W And/or Wo Contrast  Result Date: 04/01/2019 CLINICAL DATA:  58 year old male with slurred speech. Unsteadiness and fall this morning. EXAM: CT ANGIOGRAPHY HEAD AND NECK TECHNIQUE: Multidetector CT imaging of the head and neck was performed using the standard protocol during bolus administration of intravenous contrast. Multiplanar CT image reconstructions and MIPs were obtained to evaluate the vascular anatomy. Carotid stenosis measurements (when applicable) are obtained utilizing NASCET criteria, using the distal internal carotid diameter as the denominator. CONTRAST:  87m OMNIPAQUE IOHEXOL 350 MG/ML SOLN COMPARISON:  Plain head CT 1743 hours today.  CTA chest 08/06/2017.  FINDINGS: CTA NECK Skeleton: Flowing osteophytes in the lower cervical spine. No acute osseous abnormality identified. Upper chest: Patchy peribronchial opacity scattered in both upper lobes, mostly ground-glass. At the same time there is no confluent dependent opacity to suggest this is atelectasis. Although there are some atelectatic changes to the central airways. Visible mediastinal lymph nodes are within normal limits. Other neck: No acute findings. Aortic arch: 4 vessel arch configuration, the left vertebral arises directly from the arch. Mild for age arch atherosclerosis. Right carotid system: No brachiocephalic or right CCA origin stenosis despite some plaque. Soft and calcified plaque  at the right carotid bifurcation. Stenosis up to 60 % with respect to the distal vessel at the right ICA origin. Tortuous right ICA distal to the bulb with a mildly kinked appearance at the C1-C2 level, no additional stenosis to the skull base. Left carotid system: No left CCA origin stenosis despite plaque. Bulky soft plaque in the ventral left CCA at the level of the larynx without stenosis (series 5, image 58). Soft and calcified plaque at the left ICA origin without stenosis. Mildly tortuous or left ICA. The vessel remains patent to the skull base, but see details of the abnormal left ICA siphon below. Vertebral arteries: No proximal right subclavian artery stenosis despite some plaque. Normal right vertebral artery origin. Moderate stenosis of the right vertebral V2 segment at the C4-C5 level on series 7, image 134. The vessel remains patent to the skull base without additional stenosis. The left vertebral arises directly from the arch, no stenosis at its origin. As a late entry into the transverse foramen and is mildly dominant, remaining patent at the skull base without stenosis. CTA HEAD Posterior circulation: Bulky calcified plaque of the right vertebral V4 segment which is occluded just beyond the right PICA origin which remains patent on series 7, image 220. A portion of the distal right V4 is reconstituted (image 226). More dominant appearing left vertebral artery with V4 segment plaque and mild to moderate stenosis proximal to the vertebrobasilar junction. The left vertebral supplies the basilar. The basilar is patent with irregularity but no hemodynamically significant stenosis. Patent SCA and PCA origins. Both posterior communicating arteries are present. Severe stenosis of the left PCA P2 segment, which remains patent distally. Right PCA branches are within normal limits. Anterior circulation: High-grade stenosis of the petrous left ICA at the skull base resulting in a radiographic string sign  (series 7, image 224 and series 9, image 160). The vessel remains patent but is highly irregular with continued severe plaque and tandem radiographic string sign the cavernous segment on image 229. Superimposed severe stenosis of the proximal supraclinoid segment. Despite these the left ICA terminus remains patent. Normal left posterior communicating artery origin. Patent left MCA and ACA origins, mildly irregular. Left MCA M1 segment is diminutive but patent. Patent left MCA bifurcation, and no left MCA branch occlusion identified. Proximal right ICA siphon is normal but there is extensive plaque in the cavernous segment with a high-grade stenosis, radiographic string sign on series 7, image 233. Continued severe plaque through the supraclinoid segment with moderate to severe additional supraclinoid stenosis (image 244). Right posterior communicating artery remains patent. Right ICA terminus, right MCA and ACA origins remain patent. Anterior communicating artery is diminutive or absent. ACA A1 segments and bilateral ACA branches are within normal limits. Right MCA M1 and trifurcation are patent. Right MCA branches are within normal limits. Venous sinuses: Patent. Anatomic variants: Dominant left vertebral artery which arises directly  from the arch. Review of the MIP images confirms the above findings IMPRESSION: 1. Negative for large vessel occlusion, but positive for severe intracranial atherosclerosis with RADIOGRAPHIC STRING SIGN stenosis of both ICA siphons, worse on the left. There is a greater degree of soft plaque or thrombus in the left ICA siphon, and also the left CCA. 2. Segmental occlusion of the right vertebral artery V4 segment, distal to the right PICA origin which remains patent. 3. Additional hemodynamically significant stenoses: - right ICA origin (60%). - left vertebral artery V4 segment (up to moderate). - left PCA P2 segment (severe). 4. Nonspecific upper lobe peribronchial ground-glass  pulmonary opacity. Recommend correlation with chest radiographs when possible. Preliminary report of the above discussed by telephone with Dr. Blake Divine on 04/01/2019 at 18:11. We discussed follow-up CT Perfusion, versus stat DWI brain MRI. Electronically Signed   By: Genevie Ann M.D.   On: 04/01/2019 18:19   Mr Brain Wo Contrast  Result Date: 04/01/2019 CLINICAL DATA:  58 year old male code stroke presentation with slurred speech. CTA head and neck revealing widespread severe intracranial atherosclerosis. EXAM: MRI HEAD WITHOUT CONTRAST TECHNIQUE: Multiplanar, multiecho pulse sequences of the brain and surrounding structures were obtained without intravenous contrast. COMPARISON:  CT head, CTA head and neck earlier today. FINDINGS: Brain: No restricted diffusion or evidence of acute infarction. Mild for age scattered cerebral white matter T2 and FLAIR hyperintensity, most pronounced in the periatrial white matter regions. No cortical encephalomalacia or chronic blood products identified. No restricted diffusion to suggest acute infarction. No midline shift, mass effect, evidence of mass lesion, ventriculomegaly, extra-axial collection or acute intracranial hemorrhage. Cervicomedullary junction and pituitary are within normal limits. Minor T2 heterogeneity in the deep gray matter nuclei and pons is new since 2016. There are tiny chronic infarcts in the right cerebellum which have not significantly changed on series 14, image 3. Vascular: Major intracranial vascular flow voids are diminished at proximal left ICA siphon, in the distal right vertebral artery corresponding to the CTA findings. Skull and upper cervical spine: Negative visible cervical spine. Visualized bone marrow signal is within normal limits. Sinuses/Orbits: Postoperative changes to the left globe, otherwise negative orbits. Paranasal sinuses are clear. Other: Mild right mastoid effusion. Negative visible nasopharynx. Left mastoids are clear.  Scalp and face soft tissues are negative. IMPRESSION: 1. Negative for acute infarct. 2. Loss of the normal vascular flow voids in the proximal left ICA siphon and distal right Vertebral artery corresponding to some of the CTA abnormalities today. 3. 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 Electronically Signed   By: Genevie Ann M.D.   On: 04/01/2019 19:58   Dg Chest Portable 1 View  Result Date: 04/01/2019 CLINICAL DATA:  Falls, unsteady gait, slurred speech EXAM: PORTABLE CHEST 1 VIEW COMPARISON:  03/22/2019 FINDINGS: Increased interstitial markings, chronic. No focal consolidation. No pleural effusion or pneumothorax. Mild cardiomegaly. IMPRESSION: No evidence of acute cardiopulmonary disease. Electronically Signed   By: Julian Hy M.D.   On: 04/01/2019 18:37   Ct Head Code Stroke Wo Contrast`  Addendum Date: 04/01/2019   ADDENDUM REPORT: 04/01/2019 18:19 ADDENDUM: Study discussed by telephone with Dr. Blake Divine on 04/01/2019 at 1800 hours. Electronically Signed   By: Genevie Ann M.D.   On: 04/01/2019 18:19   Result Date: 04/01/2019 CLINICAL DATA:  Code stroke. 58 year old male with slurred speech this afternoon. Falls  and unsteady gait this morning. EXAM: CT HEAD WITHOUT CONTRAST TECHNIQUE: Contiguous axial images were obtained from the base of the skull through the vertex without intravenous contrast. COMPARISON:  Brain MRI 11/06/2014.  Head CT 11/19/2018. FINDINGS: Brain: No midline shift, mass effect, or evidence of intracranial mass lesion. No ventriculomegaly. No acute intracranial hemorrhage identified. Stable gray-white matter differentiation throughout the brain. Minimal to mild for age white matter hypodensity is stable. Small dystrophic calcifications in the deep cerebellar nuclei are stable. Lesser similar basal ganglia calcifications. No  cortically based acute infarct identified. Vascular: Extensive Calcified atherosclerosis at the skull base. No suspicious intracranial vascular hyperdensity. Skull: No acute osseous abnormality identified. Sinuses/Orbits: Visualized paranasal sinuses and mastoids are stable and well pneumatized. Other: Stable, negative orbit and scalp soft tissues. ASPECTS [facility] Stroke Program Early CT Score) Total score (0-10 with 10 being normal): 10 IMPRESSION: 1. No acute intracranial hemorrhage or cortically based infarct identified. ASPECTS 10. 2. Stable and largely negative for age noncontrast CT appearance of the brain. Electronically Signed: By: Genevie Ann M.D. On: 04/01/2019 17:55     Assessment/Plan: extensive medical history including episodic ataxia, diabetic neuropathy, Cad s/p stents, PVD s/o aortoiliac bypass, HTN, sleep apnea, previous TIAs, cirrhosis, corneal transplant, COPD, reccurent major depressive disorder, pulmonary nodule, admitted on the account of garbled speech, gait imbalance and uncoordination. Neuro exam with alert, awake.oriente patient, slightly dysarthric, b/l ptosis more marked on the left, 4/5 in all extremities with ataxia in 4 limbs more marked on UEX. CT/CTA showing widespread cranial vessel atherosclerosis (patient is a vasculopath per medial history) and MRI with no acute abnlities, no infarct. Most likely , given history, benign neuro exam findings of imaging patient suffered another spell vs TIA, vs hypertensive crisis, less likely to be a sz.  RECS: - Neuro protective measures including normothermia, normoglycemia, correct electrolytes/metabolic abnliites if present, treat infections if present - Freq neuro check/VS - Patient with widespread cranial vessel atherosclerosis, at risk of stroke/brain hypoperfusion: while controlling BP, avoid aggressive control to avoid  hypoperfusion SBP of 140-150 to allow for brain perfusion.  - Continue Plavix/asa - Consider vascular consult  for carotid disease - No need for EEG - Echo - PT/OT/OOB - Need close F/up with vascular neurology when d/ced - Remaining of medical treatment by primary team. Discussed with hospitalist

## 2019-04-02 NOTE — Evaluation (Signed)
Physical Therapy Evaluation Patient Details Name: Larry Terrell MRN: [id number] DOB: 02/10/1961 Today's Date: 04/02/2019   History of Present Illness  Patient is a pleasant 58 year old male who has a PMH of Heart attack, CHF, Neuropathy, COPD, DM with neuropathy, temporal giant cell atertitis, depression, OSA, PVD, cirrhosis, TIA (2016), spelnomegaly, and episodic ataxia admitted for slurred spead and unsteady gait. Imaging negative for acute infarct however shows severe intracranial atherosclerosis.  Clinical Impression  Patient is a pleasant 58 year old male who presents with generalized weakness and instability. Patient's evaluation was limited due to fatigue and Sp02 dropping to 85% with a few steps of ambulation. Upon sitting in recliner patient Sp02 returned >95%. Nursing notified of Sp02.  Patient is able to perform bed mobility with Mod I and STS with CGA/minA for cueing/setup. Patient is very motivated to return to home and improve his strength. While hospitalized patient will benefit from skilled physical therapy to improve strength, mobility, and stability. Upon discharge patient will require supervision/assistance OOB and home health physical therapy to decrease falls risk and improve quality of life.     Follow Up Recommendations Home health PT;Supervision for mobility/OOB    Equipment Recommendations  3in1 (PT)    Recommendations for Other Services       Precautions / Restrictions Precautions Precautions: Fall Restrictions Weight Bearing Restrictions: No      Mobility  Bed Mobility Overal bed mobility: Modified Independent             General bed mobility comments: able to transfer from supine to EOB with additional time and use of handrails  Transfers Overall transfer level: Needs assistance Equipment used: Rolling walker (2 wheeled) Transfers: Sit to/from Stand Sit to Stand: Min guard;Min assist         General transfer comment: Initially patient  requires cueing for hand placement for safety, has excessive posterior lean that is able to be self corrected with cueing.  Ambulation/Gait Ambulation/Gait assistance: Independent Gait Distance (Feet): 4 Feet Assistive device: Rolling walker (2 wheeled)   Gait velocity: decreased   General Gait Details: Patient has posterior trunk lean. Ambulation terminated due to SpO2 dropping to 85%. Nursing notified. SP02 raised >95% in sitting in chair.  Stairs            Wheelchair Mobility    Modified Rankin (Stroke Patients Only)       Balance Overall balance assessment: Needs assistance Sitting-balance support: Single extremity supported;Feet supported Sitting balance-Leahy Scale: Fair Sitting balance - Comments: able to sit EOB with SUE support and move within BOS Postural control: Posterior lean Standing balance support: Bilateral upper extremity supported;During functional activity Standing balance-Leahy Scale: Fair Standing balance comment: requires use of RW for short distance ambulation, initially posterior leaning however able to self correct.                             Pertinent Vitals/Pain Pain Assessment: No/denies pain(reports no pain at baseline, shows 6/10 pain via face scale with standing.)    Home Living Family/patient expects to be discharged to:: Private residence Living Arrangements: Other relatives Available Help at Discharge: Family(lives with brother and cousin who help him) Type of Home: House Home Access: Ramped entrance     Home Layout: One level Home Equipment: Walker - 2 wheels;Grab bars - toilet;Grab bars - tub/shower;Cane - single point Additional Comments: Patient reporst he primarily uses the wall in his house to move around the  home but is limited by pain in his calves.    Prior Function Level of Independence: Needs assistance   Gait / Transfers Assistance Needed: walks only short distances due to pain in calves.  ADL's /  Homemaking Assistance Needed: brother and cousin help with home tasks.  Comments: Patient ambulates short distances in home but is unable to go further at this time. Wants to get a scooter so he can be more mobile. Reports he was going to the gym occasionally before COVID     Hand Dominance   Dominant Hand: Right    Extremity/Trunk Assessment   Upper Extremity Assessment Upper Extremity Assessment: Generalized weakness    Lower Extremity Assessment Lower Extremity Assessment: Generalized weakness(grossly 4-/5)       Communication   Communication: Expressive difficulties  Cognition Arousal/Alertness: Awake/alert Behavior During Therapy: WFL for tasks assessed/performed Overall Cognitive Status: Within Functional Limits for tasks assessed                                 General Comments: Patient is pleasant and eager to interact with therapy.      General Comments      Exercises Other Exercises Other Exercises: Safe bed mobility and transfers for decreased fall risk. Patient verbalized and demonstrated understanding. Other Exercises: Seated EOB stability for ADL performance.   Assessment/Plan    PT Assessment Patient needs continued PT services  PT Problem List Decreased strength;Decreased activity tolerance;Decreased balance;Decreased mobility;Decreased coordination;Decreased knowledge of use of DME;Obesity;Cardiopulmonary status limiting activity       PT Treatment Interventions Gait training;DME instruction;Functional mobility training;Therapeutic activities;Therapeutic exercise;Manual techniques;Wheelchair mobility training;Patient/family education;Cognitive remediation;Neuromuscular re-education;Balance training    PT Goals (Current goals can be found in the Care Plan section)  Acute Rehab PT Goals Patient Stated Goal: to return home PT Goal Formulation: With patient Time For Goal Achievement: 04/16/19 Potential to Achieve Goals: Fair     Frequency Min 2X/week   Barriers to discharge   will require OOB supervision and assistance for mobility    Co-evaluation               AM-PAC PT "6 Clicks" Mobility  Outcome Measure Help needed turning from your back to your side while in a flat bed without using bedrails?: None Help needed moving from lying on your back to sitting on the side of a flat bed without using bedrails?: A Little Help needed moving to and from a bed to a chair (including a wheelchair)?: A Little Help needed standing up from a chair using your arms (e.g., wheelchair or bedside chair)?: A Little Help needed to walk in hospital room?: A Lot Help needed climbing 3-5 steps with a railing? : A Lot 6 Click Score: 17    End of Session Equipment Utilized During Treatment: Gait belt Activity Tolerance: Patient tolerated treatment well;Patient limited by fatigue;Other (comment)(Sp02 drop to 85% with ambulation) Patient left: in chair;with call bell/phone within reach;with chair alarm set Nurse Communication: Mobility status;Precautions;Other (comment)(Sp02 drop) PT Visit Diagnosis: Unsteadiness on feet (R26.81);Other abnormalities of gait and mobility (R26.89);Repeated falls (R29.6);Muscle weakness (generalized) (M62.81);Difficulty in walking, not elsewhere classified (R26.2)    Time: 2549-8264 PT Time Calculation (min) (ACUTE ONLY): 24 min   Charges:   PT Evaluation $PT Eval Low Complexity: 1 Low PT Treatments $Therapeutic Activity: 8-22 mins        Janna Arch, PT, DPT    Janna Arch 04/02/2019, 10:44 AM

## 2019-04-02 NOTE — Progress Notes (Signed)
Ch received an OR for AD education. Pt was open to have the education. Ch provided spiritual support as the pt shared about his health challenges that he has exp. since the pt was 58 y.o. The pt has been on disability since 2005 related to heart complications. Ch provided words of comfort and conversed about the Bible with the pt and how he longed for being in fellowship again but has been limited b/c of his health. Pt is hopeful to gain resources that will allow him to get around safer. Ch completed GOC for pt and provided a copy of an AD and a Bible for the pt as well as a prayer cloth. Ch visit was appreciated.     04/02/19 1100  Clinical Encounter Type  Visited With Patient  Visit Type Psychological support;Spiritual support;Social support;Other (Comment) (AD education )  Referral From Nurse  Consult/Referral To Chaplain  Spiritual Encounters  Spiritual Needs Sacred text;Prayer;Emotional;Grief support  Stress Factors  Patient Stress Factors Health changes  Family Stress Factors None identified

## 2019-04-02 NOTE — Plan of Care (Signed)
  Problem: Education: Goal: Knowledge of General Education information will improve Description: Including pain rating scale, medication(s)/side effects and non-pharmacologic comfort measures Outcome: Progressing   Problem: Health Behavior/Discharge Planning: Goal: Ability to manage health-related needs will improve Outcome: Progressing   Problem: Clinical Measurements: Goal: Ability to maintain clinical measurements within normal limits will improve Outcome: Progressing Goal: Will remain free from infection Outcome: Progressing Goal: Diagnostic test results will improve Outcome: Progressing Goal: Respiratory complications will improve Outcome: Progressing Goal: Cardiovascular complication will be avoided Outcome: Progressing   Problem: Activity: Goal: Risk for activity intolerance will decrease Outcome: Progressing  Physical Therapy evaluation performed this shift Problem: Nutrition: Goal: Adequate nutrition will be maintained Outcome: Progressing   Problem: Coping: Goal: Level of anxiety will decrease Outcome: Progressing   Problem: Elimination: Goal: Will not experience complications related to bowel motility Outcome: Progressing Goal: Will not experience complications related to urinary retention Outcome: Progressing   Problem: Pain Managment: Goal: General experience of comfort will improve Outcome: Progressing   Problem: Safety: Goal: Ability to remain free from injury will improve Outcome: Progressing   Problem: Skin Integrity: Goal: Risk for impaired skin integrity will decrease Outcome: Progressing   Problem: Education: Goal: Knowledge of disease or condition will improve Outcome: Progressing Goal: Knowledge of secondary prevention will improve Outcome: Progressing Goal: Knowledge of patient specific risk factors addressed and post discharge goals established will improve Outcome: Progressing Goal: Individualized Educational Video(s) Outcome:  Progressing   Problem: Coping: Goal: Will verbalize positive feelings about self Outcome: Progressing Goal: Will identify appropriate support needs Outcome: Progressing   Problem: Health Behavior/Discharge Planning: Goal: Ability to manage health-related needs will improve Outcome: Progressing   Problem: Self-Care: Goal: Ability to participate in self-care as condition permits will improve Outcome: Progressing Goal: Verbalization of feelings and concerns over difficulty with self-care will improve Outcome: Progressing Goal: Ability to communicate needs accurately will improve Outcome: Progressing   Problem: Nutrition: Goal: Risk of aspiration will decrease Outcome: Progressing Goal: Dietary intake will improve Outcome: Progressing   Problem: Intracerebral Hemorrhage Tissue Perfusion: Goal: Complications of Intracerebral Hemorrhage will be minimized Outcome: Progressing   Problem: Ischemic Stroke/TIA Tissue Perfusion: Goal: Complications of ischemic stroke/TIA will be minimized Outcome: Progressing   Problem: Spontaneous Subarachnoid Hemorrhage Tissue Perfusion: Goal: Complications of Spontaneous Subarachnoid Hemorrhage will be minimized Outcome: Progressing

## 2019-04-02 NOTE — Plan of Care (Signed)
Problem: Education: Goal: Knowledge of General Education information will improve Description: Including pain rating scale, medication(s)/side effects and non-pharmacologic comfort measures Outcome: Progressing   Problem: Health Behavior/Discharge Planning: Goal: Ability to manage health-related needs will improve Outcome: Progressing   Problem: Clinical Measurements: Goal: Ability to maintain clinical measurements within normal limits will improve Outcome: Progressing Goal: Will remain free from infection Outcome: Progressing Goal: Diagnostic test results will improve Outcome: Progressing Goal: Respiratory complications will improve Outcome: Progressing Goal: Cardiovascular complication will be avoided Outcome: Progressing   Problem: Activity: Goal: Risk for activity intolerance will decrease Outcome: Progressing   Problem: Nutrition: Goal: Adequate nutrition will be maintained Outcome: Progressing   Problem: Coping: Goal: Level of anxiety will decrease Outcome: Progressing   Problem: Elimination: Goal: Will not experience complications related to bowel motility Outcome: Progressing Goal: Will not experience complications related to urinary retention Outcome: Progressing   Problem: Pain Managment: Goal: General experience of comfort will improve Outcome: Progressing   Problem: Safety: Goal: Ability to remain free from injury will improve Outcome: Progressing   Problem: Skin Integrity: Goal: Risk for impaired skin integrity will decrease Outcome: Progressing   Problem: Education: Goal: Knowledge of disease or condition will improve Outcome: Progressing Goal: Knowledge of secondary prevention will improve Outcome: Progressing Goal: Knowledge of patient specific risk factors addressed and post discharge goals established will improve Outcome: Progressing Goal: Individualized Educational Video(s) Outcome: Progressing   Problem: Coping: Goal: Will verbalize  positive feelings about self Outcome: Progressing Goal: Will identify appropriate support needs Outcome: Progressing   Problem: Health Behavior/Discharge Planning: Goal: Ability to manage health-related needs will improve Outcome: Progressing   Problem: Self-Care: Goal: Ability to participate in self-care as condition permits will improve Outcome: Progressing Goal: Verbalization of feelings and concerns over difficulty with self-care will improve Outcome: Progressing Goal: Ability to communicate needs accurately will improve Outcome: Progressing   Problem: Nutrition: Goal: Risk of aspiration will decrease Outcome: Progressing Goal: Dietary intake will improve Outcome: Progressing   Problem: Intracerebral Hemorrhage Tissue Perfusion: Goal: Complications of Intracerebral Hemorrhage will be minimized Outcome: Progressing   Problem: Ischemic Stroke/TIA Tissue Perfusion: Goal: Complications of ischemic stroke/TIA will be minimized Outcome: Progressing   Problem: Spontaneous Subarachnoid Hemorrhage Tissue Perfusion: Goal: Complications of Spontaneous Subarachnoid Hemorrhage will be minimized Outcome: Progressing   Problem: Education: Goal: Knowledge of General Education information will improve Description: Including pain rating scale, medication(s)/side effects and non-pharmacologic comfort measures Outcome: Progressing   Problem: Health Behavior/Discharge Planning: Goal: Ability to manage health-related needs will improve Outcome: Progressing   Problem: Clinical Measurements: Goal: Ability to maintain clinical measurements within normal limits will improve Outcome: Progressing Goal: Will remain free from infection Outcome: Progressing Goal: Diagnostic test results will improve Outcome: Progressing Goal: Respiratory complications will improve Outcome: Progressing Goal: Cardiovascular complication will be avoided Outcome: Progressing   Problem: Activity: Goal: Risk  for activity intolerance will decrease Outcome: Progressing   Problem: Nutrition: Goal: Adequate nutrition will be maintained Outcome: Progressing   Problem: Coping: Goal: Level of anxiety will decrease Outcome: Progressing   Problem: Elimination: Goal: Will not experience complications related to bowel motility Outcome: Progressing Goal: Will not experience complications related to urinary retention Outcome: Progressing   Problem: Pain Managment: Goal: General experience of comfort will improve Outcome: Progressing   Problem: Safety: Goal: Ability to remain free from injury will improve Outcome: Progressing   Problem: Skin Integrity: Goal: Risk for impaired skin integrity will decrease Outcome: Progressing   Problem: Education: Goal: Knowledge of disease or condition  will improve Outcome: Progressing Goal: Knowledge of secondary prevention will improve Outcome: Progressing Goal: Knowledge of patient specific risk factors addressed and post discharge goals established will improve Outcome: Progressing Goal: Individualized Educational Video(s) Outcome: Progressing   Problem: Coping: Goal: Will verbalize positive feelings about self Outcome: Progressing Goal: Will identify appropriate support needs Outcome: Progressing   Problem: Health Behavior/Discharge Planning: Goal: Ability to manage health-related needs will improve Outcome: Progressing   Problem: Self-Care: Goal: Ability to participate in self-care as condition permits will improve Outcome: Progressing Goal: Verbalization of feelings and concerns over difficulty with self-care will improve Outcome: Progressing Goal: Ability to communicate needs accurately will improve Outcome: Progressing   Problem: Nutrition: Goal: Risk of aspiration will decrease Outcome: Progressing Goal: Dietary intake will improve Outcome: Progressing   Problem: Intracerebral Hemorrhage Tissue Perfusion: Goal: Complications of  Intracerebral Hemorrhage will be minimized Outcome: Progressing   Problem: Ischemic Stroke/TIA Tissue Perfusion: Goal: Complications of ischemic stroke/TIA will be minimized Outcome: Progressing   Problem: Spontaneous Subarachnoid Hemorrhage Tissue Perfusion: Goal: Complications of Spontaneous Subarachnoid Hemorrhage will be minimized Outcome: Progressing

## 2019-04-02 NOTE — Progress Notes (Signed)
PT Cancellation Note  Patient Details Name: Sipriano Fendley MRN: [id number] DOB: 08/30/1960   Cancelled Treatment:    Reason Eval/Treat Not Completed: Patient at procedure or test/unavailable;Other (comment)(Patient consult received and reviewed. Upon evaluation attempt patient had other medical staff in room and was occupied. Will attempt again at later time/date.)  Janna Arch, PT, DPT   04/02/2019, 9:15 AM

## 2019-04-03 LAB — LDL CHOLESTEROL, DIRECT: Direct LDL: 73 mg/dL (ref 0–99)

## 2019-04-03 LAB — GLUCOSE, CAPILLARY
Glucose-Capillary: 263 mg/dL — ABNORMAL HIGH (ref 70–99)
Glucose-Capillary: 262 mg/dL — ABNORMAL HIGH (ref 70–99)
Glucose-Capillary: 206 mg/dL — ABNORMAL HIGH (ref 70–99)
Glucose-Capillary: 424 mg/dL — ABNORMAL HIGH (ref 70–99)
Glucose-Capillary: 273 mg/dL — ABNORMAL HIGH (ref 70–99)

## 2019-04-03 MED ORDER — IPRATROPIUM-ALBUTEROL 0.5-2.5 (3) MG/3ML IN SOLN
3.0000 mL | Freq: Four times a day (QID) | RESPIRATORY_TRACT | Status: DC
  Administered 2019-04-03 – 2019-04-04 (×3): 3 mL via RESPIRATORY_TRACT
  Filled 2019-04-03 (×4): qty 3

## 2019-04-03 MED ORDER — CLONIDINE HCL 0.1 MG PO TABS: 0.1000 mg | ORAL_TABLET | Freq: Two times a day (BID) | ORAL | 1 refills | Status: DC

## 2019-04-03 MED ORDER — PERFLUTREN LIPID MICROSPHERE: 1.0000 mL | INTRAVENOUS | Status: AC | PRN

## 2019-04-03 NOTE — TOC Progression Note (Addendum)
Transition of Care [facility]) - Progression Note    Patient Details  Name: Kadeem Hyle MRN: [id number] Date of Birth: 1961-02-13  Transition of Care [facility]) CM/SW Contact  Shelbie Hutching, RN Phone Number: 04/03/2019, 2:29 PM  Clinical Narrative:    [employer] was unable to get insurance approval for home oxygen.  Patient on room air currently and oxygen sats 100%.  Patient has a PCP follow up appointment on 9/28- RNCM instructed patient to ask his PCP about referral to pulmonologist so he can be more closely followed with his COPD.  MD updated on insurance denial for O2.  Since patient is still oxygenating well on room air and is not currently experiencing a COPD exacerbation patient is still medically stable for discharge.  Home Health has been arranged and will follow patient at home.   Update 1452: MD has decided to cancel discharge and start patient on Duo nebs.  RNCM will follow up on patient tomorrow.     Expected Discharge Plan: [place] Barriers to Discharge: Barriers Resolved  Expected Discharge Plan and Services Expected Discharge Plan: [place]   Discharge Planning Services: CM Consult Post Acute Care Choice: [place] arrangements for the past 2 months: Single Family Home Expected Discharge Date: 04/03/19               DME Arranged: 3-N-1 DME Agency: [employer] Date DME Agency Contacted: 04/03/19 Time DME Agency Contacted: [phone]   [place] Arranged: RN, PT, OT, Nurse's Aide [place] Agency: [place] ([place]) Date [place]: 04/03/19 Time [place]: 1028 Representative spoke with at [place]: [place] (SDOH) Interventions    Readmission Risk Interventions No flowsheet data found.

## 2019-04-03 NOTE — Progress Notes (Signed)
MD Marcille Blanco contacted due to BP 173/104. MD Marcille Blanco stated this is acceptable for possible stroke patients, no new orders given, other VS stable. Patient is asymptomatic. Will continue to monitor.

## 2019-04-03 NOTE — TOC Progression Note (Signed)
Transition of Care [facility]) - Progression Note    Patient Details  Name: Larry Terrell MRN: [id number] Date of Birth: 1961/05/06  Transition of Care [facility]) CM/SW Contact  Shelbie Hutching, RN Phone Number: 04/03/2019, 12:13 PM  Clinical Narrative:    Patient qualifies for oxygen, oxygen will be provided by [facility].  Sunday [place] with [employer] will bring O2 to the patient's room.     Expected Discharge Plan: [place] Barriers to Discharge: Barriers Resolved  Expected Discharge Plan and Services Expected Discharge Plan: [place]   Discharge Planning Services: CM Consult Post Acute Care Choice: [place] arrangements for the past 2 months: Single Family Home Expected Discharge Date: 04/03/19               DME Arranged: 3-N-1 DME Agency: [facility] Date DME Agency Contacted: 04/03/19 Time DME Agency Contacted: 1028   [place] Arranged: RN, PT, OT, Nurse's Aide [place] Agency: [place] ([place]) Date [place]: 04/03/19 Time [place]: 1028 Representative spoke with at [place]: [place] (SDOH) Interventions    Readmission Risk Interventions No flowsheet data found.

## 2019-04-03 NOTE — Evaluation (Signed)
Occupational Therapy Evaluation Patient Details Name: Larry Terrell MRN: [id number] DOB: [birth year] Today's Date: 04/03/2019    History of Present Illness Patient is a pleasant 58 year old male who has a PMH of Heart attack, CHF, Neuropathy, COPD, DM with neuropathy, temporal giant cell atertitis, depression, OSA, PVD, cirrhosis, TIA (2016), spelnomegaly, and episodic ataxia admitted for slurred spead and unsteady gait. Imaging negative for acute infarct however shows severe intracranial atherosclerosis.   Clinical Impression   Larry Terrell was seen for OT evaluation this date. Prior to hospital admission, pt was generally independent in ADL mgt, and received assistance from his brother or cousin for IADLs including shopping and medication mgt.  Pt lives with he brother and cousin in a 1 level hous with a ramped entrance. During evaluation, pt states he is eager for his legs to feel better so he can travel more and be more independent. Currently pt demonstrates impairments in strength, coordination, balance, and functional UE use requiring set-up/supervision for upper body ADLs and moderate assist for lower body ADL mgt. Pt is also limited by cardiopulmonary status and required consistent education and cueing to engage in pursed lip breathing energy conservation strategies during functional transfer attempts.  Pt would benefit from skilled OT to address noted impairments and functional limitations (see below for any additional details) in order to maximize safety and independence while minimizing falls risk and caregiver burden.  Upon hospital discharge, recommend [place] with supervision/assistance during mobility/transfers and ADL mgt.     Follow Up Recommendations  Home health OT;Supervision - Intermittent    Equipment Recommendations  3 in 1 bedside commode    Recommendations for Other Services       Precautions / Restrictions Precautions Precautions: Fall Restrictions Weight Bearing  Restrictions: No      Mobility Bed Mobility Overal bed mobility: Modified Independent             General bed mobility comments: able to transfer from supine to EOB with additional time and use of handrails. VC's for hand placement and technique.  Transfers Overall transfer level: Needs assistance Equipment used: Rolling walker (2 wheeled) Transfers: Sit to/from Stand Sit to Stand: Min guard;Min assist         General transfer comment: Initially patient requires cueing for hand placement for safety, has excessive posterior lean that is able to be self corrected with cueing    Balance Overall balance assessment: Needs assistance Sitting-balance support: Single extremity supported;Feet supported Sitting balance-Leahy Scale: Fair Sitting balance - Comments: able to sit EOB with SUE support. Steady reaching within BOS. Postural control: Posterior lean Standing balance support: Bilateral upper extremity supported;During functional activity Standing balance-Leahy Scale: Poor Standing balance comment: Req. CGA to min assist to maintain standing at EOB. Pt unable to take steps this date. When attempting to march in place, pt loses balance and sits back down on bed. Attempted amb x3 this date.                           ADL either performed or assessed with clinical judgement   ADL Overall ADL's : Needs assistance/impaired                                       General ADL Comments: Pt very unstable during STS tf this date. Unable to maintain standing balance without min assist to CGA.  Pt unsafe to perform lower body dressing from sit to stand. Would benefit from trial of AE for LB dressing/ADL mgt. Pt able to perform oral care while sitting EOB given set-up assist and min VC's for sequencing.     Vision Baseline Vision/History: Wears glasses Wears Glasses: At all times Patient Visual Report: No change from baseline       Perception     Praxis       Pertinent Vitals/Pain Pain Assessment: 0-10 Pain Score: 5  Pain Location: BLE - legs and feet Pain Descriptors / Indicators: Sore;Discomfort Pain Intervention(s): Limited activity within patient's tolerance;Monitored during session;Repositioned     Hand Dominance Right   Extremity/Trunk Assessment Upper Extremity Assessment Upper Extremity Assessment: Generalized weakness(Grossly 3+/5 throughout BUE. LUE shoulder adduction/flextion limited in ROM. Pt reports hx of L rotator cuff injury. RUE WFL.)   Lower Extremity Assessment Lower Extremity Assessment: Defer to PT evaluation;Generalized weakness       Communication Communication Communication: No difficulties   Cognition Arousal/Alertness: Awake/alert Behavior During Therapy: WFL for tasks assessed/performed Overall Cognitive Status: No family/caregiver present to determine baseline cognitive functioning                                 General Comments: Pt pleasant, generally able to follow 1-step commands consistently. Occasionally requiring re-direction to topic/task at hand.   General Comments  Pt SpO2 during session between 90-95. Increases briefly with cues to PLB, but does not maintain >95 on RA.    Exercises Other Exercises Other Exercises: Pt educated in safe transfer technique, bed mobility strategies including log roll technique to minimze exertion/SOB, falls prevention strategies, and safe use of AE for functional mobility this date. Other Exercises: Pt assisted with seated grooming tasks and functional mobility. OT provided min A to CGA during STS and amb attempts as well as set-up/supervision during oral care this date.   Shoulder Instructions      Home Living Family/patient expects to be discharged to:: Private residence Living Arrangements: Other relatives Available Help at Discharge: Family(cousin and brother) Type of Home: House Home Access: Ramped entrance     Arp: One level      Bathroom Shower/Tub: Tub/shower unit         Home Equipment: Walker - 2 wheels;Grab bars - toilet;Grab bars - tub/shower;Cane - single point   Additional Comments: Patient reporst he primarily uses the wall in his house to move around the home but is limited by pain in his calves.      Prior Functioning/Environment Level of Independence: Needs assistance  Gait / Transfers Assistance Needed: walks only short distances due to pain in calves. ADL's / Homemaking Assistance Needed: brother and cousin help with home tasks.   Comments: Patient ambulates short distances in home but is unable to go further at this time. Wants to get a scooter so he can be more mobile. Reports he was going to the gym occasionally before COVID        OT Problem List: Decreased strength;Decreased coordination;Pain;Decreased range of motion;Impaired UE functional use;Impaired balance (sitting and/or standing)      OT Treatment/Interventions: Self-care/ADL training;Balance training;Therapeutic exercise;Therapeutic activities;Energy conservation;DME and/or AE instruction;Patient/family education;Cognitive remediation/compensation    OT Goals(Current goals can be found in the care plan section) Acute Rehab OT Goals Patient Stated Goal: to return home OT Goal Formulation: With patient Time For Goal Achievement: 04/17/19 Potential to Achieve Goals: Good ADL Goals Pt  Will Perform Upper Body Dressing: sitting;with supervision;with set-up(With LRAD PRN for improved safety and functional independence) Pt Will Perform Lower Body Dressing: sit to/from stand;with adaptive equipment;with set-up;with supervision(With LRAD PRN for improved safety and functional independence)  OT Frequency: Min 1X/week   Barriers to D/C:            Co-evaluation              AM-PAC OT "6 Clicks" Daily Activity     Outcome Measure Help from another person eating meals?: A Little Help from another person taking care of  personal grooming?: A Little Help from another person toileting, which includes using toliet, bedpan, or urinal?: A Lot Help from another person bathing (including washing, rinsing, drying)?: A Lot Help from another person to put on and taking off regular upper body clothing?: A Little Help from another person to put on and taking off regular lower body clothing?: A Lot 6 Click Score: 15   End of Session Equipment Utilized During Treatment: Gait belt;Rolling walker  Activity Tolerance: Patient tolerated treatment well Patient left: in bed;with call bell/phone within reach;with bed alarm set  OT Visit Diagnosis: Other abnormalities of gait and mobility (R26.89);History of falling (Z91.81);Pain Pain - Right/Left: (Both) Pain - part of body: Leg;Ankle and joints of foot                Time: 1779-3903 OT Time Calculation (min): 41 min Charges:  OT General Charges $OT Visit: 1 Visit OT Evaluation $OT Eval Moderate Complexity: 1 Mod OT Treatments $Self Care/Home Management : 23-37 mins  Shara Blazing, M.S., OTR/L Ascom: [phone] 04/03/19, 11:46 AM

## 2019-04-03 NOTE — TOC Initial Note (Signed)
Transition of Care [facility]) - Initial/Assessment Note    Patient Details  Name: Larry Terrell MRN: [id number] Date of Birth: 06/05/1961  [facility]) CM/SW Contact:    Larry Hutching, RN Phone Number: 04/03/2019, 10:31 AM  Clinical Narrative:                 Patient admitted with TIA, history of ataxia, COPD, CHF, and diabetes type 2.  Patient reports that he is from home and his brother Larry Terrell lives with him.  Patient reports that he is disabled and receives disability.  Patient is current with PCP at [facility], Dr. Loraine Terrell.  Patient reports he also gets his prescriptions from [facility]. Patient agrees to home health services at discharge and patient chooses [place].  Referral for home health given to Larry Terrell with [place] for RN, PT, OT, and aide.  Patient reports that he has a RW and cane at home but he does need a 3 in 1.  3 in 1 will be brought up to room before discharge.   Patient's brother Larry Terrell will provide transportation at discharge.    Expected Discharge Plan: [place] Barriers to Discharge: Barriers Resolved   Patient Goals and CMS Choice   CMS Medicare.gov Compare Post Acute Care list provided to:: Patient Choice offered to / list presented to : Patient  Expected Discharge Plan and Services Expected Discharge Plan: [place]   Discharge Planning Services: CM Consult Post Acute Care Choice: [place] arrangements for the past 2 months: Single Family Home Expected Discharge Date: 04/03/19               DME Arranged: 3-N-1 DME Agency: [facility] Date DME Agency Contacted: 04/03/19 Time DME Agency Contacted: [phone]   [place] Arranged: RN, PT, OT, Nurse's Aide [place] Agency: [place] ([place]) Date [place]: 04/03/19 Time [place]: 1028 Representative spoke with at [place]: Larry Terrell  Prior Living Arrangements/Services Living arrangements for the past 2  months: [place] Lives with:: Siblings Patient language and need for interpreter reviewed:: No Do you feel safe going back to the place where you live?: Yes      Need for Family Participation in Patient Care: Yes (Comment)(CHF, COPD) Care giver support system in place?: Yes (comment)(Brother)   Criminal Activity/Legal Involvement Pertinent to Current Situation/Hospitalization: No - Comment as needed  Activities of Daily Living Home Assistive Devices/Equipment: Walker (specify type), CPAP, Eyeglasses, CBG Meter ADL Screening (condition at time of admission) Patient's cognitive ability adequate to safely complete daily activities?: Yes Is the patient deaf or have difficulty hearing?: No Does the patient have difficulty seeing, even when wearing glasses/contacts?: No Does the patient have difficulty concentrating, remembering, or making decisions?: No Patient able to express need for assistance with ADLs?: Yes Does the patient have difficulty dressing or bathing?: Yes Independently performs ADLs?: Yes (appropriate for developmental age) Does the patient have difficulty walking or climbing stairs?: Yes Weakness of Legs: Both Weakness of Arms/Hands: None  Permission Sought/Granted Permission sought to share information with : Case Manager, Other (comment) Permission granted to share information with : Yes, Verbal Permission Granted     Permission granted to share info w AGENCY: [facility]        Emotional Assessment Appearance:: Appears older than stated age Attitude/Demeanor/Rapport: Engaged Affect (typically observed): Accepting Orientation: : Oriented to Self, Oriented to Place, Oriented to  Time, Oriented to  Situation Alcohol / Substance Use: Not Applicable Psych Involvement: No (comment)  Admission diagnosis:  Ataxia [R27.0] Stroke ([place]) [I63.9] Episodic ataxia ([place]) [G11.8] Dysarthria [R47.1] Patient Active Problem List   Diagnosis Date Noted  .  Stroke ([place]) 04/01/2019  . NSTEMI (non-ST elevated myocardial infarction) ([place]) 08/06/2017  . Acute appendicitis 07/10/2015  . S/P laparoscopic appendectomy 07/10/2015  . Appendicitis, acute   . Diabetes mellitus with complication ([place])   . 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 Lactic acidosis   . Acute respiratory failure with hypoxia [facility])    PCP:  Medicine, [place]:   [place] ([place]), [place] - [place]) [place] [address] Phone: [phone] Fax: [phone]     Social Determinants of Health (SDOH) Interventions    Readmission Risk Interventions No flowsheet data found.

## 2019-04-03 NOTE — Progress Notes (Signed)
[facility] NAME: Larry Terrell    MR#:  [id number]  DATE OF BIRTH:  [birth year]  SUBJECTIVE:   Blood pressure improved since yesterday, clinically otherwise doing well but still remains hypoxic on minimal exertion.  REVIEW OF SYSTEMS:    Review of Systems  Constitutional: Negative for chills and fever.  HENT: Negative for congestion and tinnitus.   Eyes: Negative for blurred vision and double vision.  Respiratory: Negative for cough, shortness of breath and wheezing.   Cardiovascular: Negative for chest pain, orthopnea and PND.  Gastrointestinal: Negative for abdominal pain, diarrhea, nausea and vomiting.  Genitourinary: Negative for dysuria and hematuria.  Neurological: Negative for dizziness, sensory change and focal weakness.  All other systems reviewed and are negative.   Nutrition: Carb Control Tolerating Diet: Yes Tolerating PT: Eval noted.   DRUG ALLERGIES:   Allergies  Allergen Reactions   Gabapentin    Ace Inhibitors Cough    Other reaction(s): Other (See Comments) cough    VITALS:  Blood pressure (!) 169/81, pulse 75, temperature 97.7 F (36.5 C), temperature source Oral, resp. rate 17, height 5' 7"  (1.702 m), weight 89.4 kg, SpO2 100 %.  PHYSICAL EXAMINATION:   Physical Exam  GENERAL:  58 y.o.-year-old patient lying in bed in no acute distress.  EYES: Pupils equal, round, reactive to Barris and accommodation. No scleral icterus. Extraocular muscles intact.  HEENT: Head atraumatic, normocephalic. Oropharynx and nasopharynx clear.  NECK:  Supple, no jugular venous distention. No thyroid enlargement, no tenderness.  LUNGS: Normal breath sounds bilaterally, no wheezing, rales, rhonchi. No use of accessory muscles of respiration.  CARDIOVASCULAR: S1, S2 normal. No murmurs, rubs, or gallops.  ABDOMEN: Soft, nontender, nondistended. Bowel sounds present. No organomegaly or mass.  EXTREMITIES: No cyanosis,  clubbing or edema b/l.    NEUROLOGIC: Cranial nerves II through XII are intact. No focal Motor or sensory deficits b/l. Globally weak   PSYCHIATRIC: The patient is alert and oriented x 3.  SKIN: No obvious rash, lesion, or ulcer.    LABORATORY PANEL:   CBC Recent Labs  Lab 04/01/19 1719  WBC 7.1  HGB 15.4  HCT 43.4  PLT 175   ------------------------------------------------------------------------------------------------------------------  Chemistries  Recent Labs  Lab 04/01/19 1719  NA 135  K 4.4  CL 102  CO2 22  GLUCOSE 264*  BUN 18  CREATININE 1.17  CALCIUM 9.6  AST 100*  ALT 62*  ALKPHOS 101  BILITOT 1.4*   ------------------------------------------------------------------------------------------------------------------  Cardiac Enzymes No results for input(s): TROPONINI in the last 168 hours. ------------------------------------------------------------------------------------------------------------------  RADIOLOGY:  Ct Angio Head W Or Wo Contrast  Result Date: 04/01/2019 CLINICAL DATA:  58 year old male with slurred speech. Unsteadiness and fall this morning. EXAM: CT ANGIOGRAPHY HEAD AND NECK TECHNIQUE: Multidetector CT imaging of the head and neck was performed using the standard protocol during bolus administration of intravenous contrast. Multiplanar CT image reconstructions and MIPs were obtained to evaluate the vascular anatomy. Carotid stenosis measurements (when applicable) are obtained utilizing NASCET criteria, using the distal internal carotid diameter as the denominator. CONTRAST:  22m OMNIPAQUE IOHEXOL 350 MG/ML SOLN COMPARISON:  Plain head CT 1743 hours today.  CTA chest 08/06/2017. FINDINGS: CTA NECK Skeleton: Flowing osteophytes in the lower cervical spine. No acute osseous abnormality identified. Upper chest: Patchy peribronchial opacity scattered in both upper lobes, mostly ground-glass. At the same time there is no confluent dependent opacity to  suggest this is atelectasis. Although there are some  atelectatic changes to the central airways. Visible mediastinal lymph nodes are within normal limits. Other neck: No acute findings. Aortic arch: 4 vessel arch configuration, the left vertebral arises directly from the arch. Mild for age arch atherosclerosis. Right carotid system: No brachiocephalic or right CCA origin stenosis despite some plaque. Soft and calcified plaque at the right carotid bifurcation. Stenosis up to 60 % with respect to the distal vessel at the right ICA origin. Tortuous right ICA distal to the bulb with a mildly kinked appearance at the C1-C2 level, no additional stenosis to the skull base. Left carotid system: No left CCA origin stenosis despite plaque. Bulky soft plaque in the ventral left CCA at the level of the larynx without stenosis (series 5, image 58). Soft and calcified plaque at the left ICA origin without stenosis. Mildly tortuous or left ICA. The vessel remains patent to the skull base, but see details of the abnormal left ICA siphon below. Vertebral arteries: No proximal right subclavian artery stenosis despite some plaque. Normal right vertebral artery origin. Moderate stenosis of the right vertebral V2 segment at the C4-C5 level on series 7, image 134. The vessel remains patent to the skull base without additional stenosis. The left vertebral arises directly from the arch, no stenosis at its origin. As a late entry into the transverse foramen and is mildly dominant, remaining patent at the skull base without stenosis. CTA HEAD Posterior circulation: Bulky calcified plaque of the right vertebral V4 segment which is occluded just beyond the right PICA origin which remains patent on series 7, image 220. A portion of the distal right V4 is reconstituted (image 226). More dominant appearing left vertebral artery with V4 segment plaque and mild to moderate stenosis proximal to the vertebrobasilar junction. The left vertebral  supplies the basilar. The basilar is patent with irregularity but no hemodynamically significant stenosis. Patent SCA and PCA origins. Both posterior communicating arteries are present. Severe stenosis of the left PCA P2 segment, which remains patent distally. Right PCA branches are within normal limits. Anterior circulation: High-grade stenosis of the petrous left ICA at the skull base resulting in a radiographic string sign (series 7, image 224 and series 9, image 160). The vessel remains patent but is highly irregular with continued severe plaque and tandem radiographic string sign the cavernous segment on image 229. Superimposed severe stenosis of the proximal supraclinoid segment. Despite these the left ICA terminus remains patent. Normal left posterior communicating artery origin. Patent left MCA and ACA origins, mildly irregular. Left MCA M1 segment is diminutive but patent. Patent left MCA bifurcation, and no left MCA branch occlusion identified. Proximal right ICA siphon is normal but there is extensive plaque in the cavernous segment with a high-grade stenosis, radiographic string sign on series 7, image 233. Continued severe plaque through the supraclinoid segment with moderate to severe additional supraclinoid stenosis (image 244). Right posterior communicating artery remains patent. Right ICA terminus, right MCA and ACA origins remain patent. Anterior communicating artery is diminutive or absent. ACA A1 segments and bilateral ACA branches are within normal limits. Right MCA M1 and trifurcation are patent. Right MCA branches are within normal limits. Venous sinuses: Patent. Anatomic variants: Dominant left vertebral artery which arises directly from the arch. Review of the MIP images confirms the above findings IMPRESSION: 1. Negative for large vessel occlusion, but positive for severe intracranial atherosclerosis with RADIOGRAPHIC STRING SIGN stenosis of both ICA siphons, worse on the left. There is a  greater degree of soft plaque or  thrombus in the left ICA siphon, and also the left CCA. 2. Segmental occlusion of the right vertebral artery V4 segment, distal to the right PICA origin which remains patent. 3. Additional hemodynamically significant stenoses: - right ICA origin (60%). - left vertebral artery V4 segment (up to moderate). - left PCA P2 segment (severe). 4. Nonspecific upper lobe peribronchial ground-glass pulmonary opacity. Recommend correlation with chest radiographs when possible. Preliminary report of the above discussed by telephone with Dr. Blake Divine on 04/01/2019 at 18:11. We discussed follow-up CT Perfusion, versus stat DWI brain MRI. Electronically Signed   By: Genevie Ann M.D.   On: 04/01/2019 18:19   Ct Angio Neck W And/or Wo Contrast  Result Date: 04/01/2019 CLINICAL DATA:  58 year old male with slurred speech. Unsteadiness and fall this morning. EXAM: CT ANGIOGRAPHY HEAD AND NECK TECHNIQUE: Multidetector CT imaging of the head and neck was performed using the standard protocol during bolus administration of intravenous contrast. Multiplanar CT image reconstructions and MIPs were obtained to evaluate the vascular anatomy. Carotid stenosis measurements (when applicable) are obtained utilizing NASCET criteria, using the distal internal carotid diameter as the denominator. CONTRAST:  68m OMNIPAQUE IOHEXOL 350 MG/ML SOLN COMPARISON:  Plain head CT 1743 hours today.  CTA chest 08/06/2017. FINDINGS: CTA NECK Skeleton: Flowing osteophytes in the lower cervical spine. No acute osseous abnormality identified. Upper chest: Patchy peribronchial opacity scattered in both upper lobes, mostly ground-glass. At the same time there is no confluent dependent opacity to suggest this is atelectasis. Although there are some atelectatic changes to the central airways. Visible mediastinal lymph nodes are within normal limits. Other neck: No acute findings. Aortic arch: 4 vessel arch configuration, the left  vertebral arises directly from the arch. Mild for age arch atherosclerosis. Right carotid system: No brachiocephalic or right CCA origin stenosis despite some plaque. Soft and calcified plaque at the right carotid bifurcation. Stenosis up to 60 % with respect to the distal vessel at the right ICA origin. Tortuous right ICA distal to the bulb with a mildly kinked appearance at the C1-C2 level, no additional stenosis to the skull base. Left carotid system: No left CCA origin stenosis despite plaque. Bulky soft plaque in the ventral left CCA at the level of the larynx without stenosis (series 5, image 58). Soft and calcified plaque at the left ICA origin without stenosis. Mildly tortuous or left ICA. The vessel remains patent to the skull base, but see details of the abnormal left ICA siphon below. Vertebral arteries: No proximal right subclavian artery stenosis despite some plaque. Normal right vertebral artery origin. Moderate stenosis of the right vertebral V2 segment at the C4-C5 level on series 7, image 134. The vessel remains patent to the skull base without additional stenosis. The left vertebral arises directly from the arch, no stenosis at its origin. As a late entry into the transverse foramen and is mildly dominant, remaining patent at the skull base without stenosis. CTA HEAD Posterior circulation: Bulky calcified plaque of the right vertebral V4 segment which is occluded just beyond the right PICA origin which remains patent on series 7, image 220. A portion of the distal right V4 is reconstituted (image 226). More dominant appearing left vertebral artery with V4 segment plaque and mild to moderate stenosis proximal to the vertebrobasilar junction. The left vertebral supplies the basilar. The basilar is patent with irregularity but no hemodynamically significant stenosis. Patent SCA and PCA origins. Both posterior communicating arteries are present. Severe stenosis of the left PCA P2 segment,  which remains  patent distally. Right PCA branches are within normal limits. Anterior circulation: High-grade stenosis of the petrous left ICA at the skull base resulting in a radiographic string sign (series 7, image 224 and series 9, image 160). The vessel remains patent but is highly irregular with continued severe plaque and tandem radiographic string sign the cavernous segment on image 229. Superimposed severe stenosis of the proximal supraclinoid segment. Despite these the left ICA terminus remains patent. Normal left posterior communicating artery origin. Patent left MCA and ACA origins, mildly irregular. Left MCA M1 segment is diminutive but patent. Patent left MCA bifurcation, and no left MCA branch occlusion identified. Proximal right ICA siphon is normal but there is extensive plaque in the cavernous segment with a high-grade stenosis, radiographic string sign on series 7, image 233. Continued severe plaque through the supraclinoid segment with moderate to severe additional supraclinoid stenosis (image 244). Right posterior communicating artery remains patent. Right ICA terminus, right MCA and ACA origins remain patent. Anterior communicating artery is diminutive or absent. ACA A1 segments and bilateral ACA branches are within normal limits. Right MCA M1 and trifurcation are patent. Right MCA branches are within normal limits. Venous sinuses: Patent. Anatomic variants: Dominant left vertebral artery which arises directly from the arch. Review of the MIP images confirms the above findings IMPRESSION: 1. Negative for large vessel occlusion, but positive for severe intracranial atherosclerosis with RADIOGRAPHIC STRING SIGN stenosis of both ICA siphons, worse on the left. There is a greater degree of soft plaque or thrombus in the left ICA siphon, and also the left CCA. 2. Segmental occlusion of the right vertebral artery V4 segment, distal to the right PICA origin which remains patent. 3. Additional hemodynamically  significant stenoses: - right ICA origin (60%). - left vertebral artery V4 segment (up to moderate). - left PCA P2 segment (severe). 4. Nonspecific upper lobe peribronchial ground-glass pulmonary opacity. Recommend correlation with chest radiographs when possible. Preliminary report of the above discussed by telephone with Dr. Blake Divine on 04/01/2019 at 18:11. We discussed follow-up CT Perfusion, versus stat DWI brain MRI. Electronically Signed   By: Genevie Ann M.D.   On: 04/01/2019 18:19   Mr Brain Wo Contrast  Result Date: 04/01/2019 CLINICAL DATA:  58 year old male code stroke presentation with slurred speech. CTA head and neck revealing widespread severe intracranial atherosclerosis. EXAM: MRI HEAD WITHOUT CONTRAST TECHNIQUE: Multiplanar, multiecho pulse sequences of the brain and surrounding structures were obtained without intravenous contrast. COMPARISON:  CT head, CTA head and neck earlier today. FINDINGS: Brain: No restricted diffusion or evidence of acute infarction. Mild for age scattered cerebral white matter T2 and FLAIR hyperintensity, most pronounced in the periatrial white matter regions. No cortical encephalomalacia or chronic blood products identified. No restricted diffusion to suggest acute infarction. No midline shift, mass effect, evidence of mass lesion, ventriculomegaly, extra-axial collection or acute intracranial hemorrhage. Cervicomedullary junction and pituitary are within normal limits. Minor T2 heterogeneity in the deep gray matter nuclei and pons is new since 2016. There are tiny chronic infarcts in the right cerebellum which have not significantly changed on series 14, image 3. Vascular: Major intracranial vascular flow voids are diminished at proximal left ICA siphon, in the distal right vertebral artery corresponding to the CTA findings. Skull and upper cervical spine: Negative visible cervical spine. Visualized bone marrow signal is within normal limits. Sinuses/Orbits:  Postoperative changes to the left globe, otherwise negative orbits. Paranasal sinuses are clear. Other: Mild right mastoid effusion. Negative visible nasopharynx.  Left mastoids are clear. Scalp and face soft tissues are negative. IMPRESSION: 1. Negative for acute infarct. 2. Loss of the normal vascular flow voids in the proximal left ICA siphon and distal right Vertebral artery corresponding to some of the CTA abnormalities today. 3. 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 Electronically Signed   By: Genevie Ann M.D.   On: 04/01/2019 19:58   Dg Chest Portable 1 View  Result Date: 04/01/2019 CLINICAL DATA:  Falls, unsteady gait, slurred speech EXAM: PORTABLE CHEST 1 VIEW COMPARISON:  03/22/2019 FINDINGS: Increased interstitial markings, chronic. No focal consolidation. No pleural effusion or pneumothorax. Mild cardiomegaly. IMPRESSION: No evidence of acute cardiopulmonary disease. Electronically Signed   By: Julian Hy M.D.   On: 04/01/2019 18:37   Ct Head Code Stroke Wo Contrast`  Addendum Date: 04/01/2019   ADDENDUM REPORT: 04/01/2019 18:19 ADDENDUM: Study discussed by telephone with Dr. Blake Divine on 04/01/2019 at 1800 hours. Electronically Signed   By: Genevie Ann M.D.   On: 04/01/2019 18:19   Result Date: 04/01/2019 CLINICAL DATA:  Code stroke. 58 year old male with slurred speech this afternoon. Falls and unsteady gait this morning. EXAM: CT HEAD WITHOUT CONTRAST TECHNIQUE: Contiguous axial images were obtained from the base of the skull through the vertex without intravenous contrast. COMPARISON:  Brain MRI 11/06/2014.  Head CT 11/19/2018. FINDINGS: Brain: No midline shift, mass effect, or evidence of intracranial mass lesion. No ventriculomegaly. No acute intracranial hemorrhage identified. Stable gray-white matter differentiation throughout the  brain. Minimal to mild for age white matter hypodensity is stable. Small dystrophic calcifications in the deep cerebellar nuclei are stable. Lesser similar basal ganglia calcifications. No cortically based acute infarct identified. Vascular: Extensive Calcified atherosclerosis at the skull base. No suspicious intracranial vascular hyperdensity. Skull: No acute osseous abnormality identified. Sinuses/Orbits: Visualized paranasal sinuses and mastoids are stable and well pneumatized. Other: Stable, negative orbit and scalp soft tissues. ASPECTS [facility] Stroke Program Early CT Score) Total score (0-10 with 10 being normal): 10 IMPRESSION: 1. No acute intracranial hemorrhage or cortically based infarct identified. ASPECTS 10. 2. Stable and largely negative for age noncontrast CT appearance of the brain. Electronically Signed: By: Genevie Ann M.D. 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  TIA Patient presented with episode of slurred speech and discoordination on waking up this morning.   -Symptoms have significantly improved since admission.  He denies any worsening slurred speech or ataxia presently.  TPA was not indicated because exact onset of symptoms not clear since patient woke up with symptoms. MRI of the brain with no acute infarct. Patient does have known history of episodic ataxia for which he follows up with [facility].  CT head and neck with no large vessel occlusion but evidence of severe intracranial atherosclerosis. -Discussed with neurology and based on the CT of the head & neck no plans for acute intervention presently as these are chronic findings.  -Continue dual antiplatelet therapy with aspirin, Plavix.  Echocardiogram showing normal ejection fraction with no wall motion abnormalities and no evidence of thrombus.  PT  and OT evaluation noted and will arrange home health upon discharge.  2.  Diabetes mellitus type 2 - Metformin,  continue sliding scale insulin for now.  Follow blood sugars which are currently stable.  3.  Hypertension-accelerated. -Blood pressure somewhat improved since yesterday.  Tolerate systolic blood pressures goal around 160-150. -Continue metoprolol and low-dose clonidine.   4.  Anxiety-continue Xanax as needed.  5.  COPD-no acute exacerbation.  Patient is having hypoxemia on minimal exertion. - Will place on duo nebs scheduled, get him an incentive spirometer.  Chest x-ray 2 days ago was negative.  May need home oxygen upon discharge.  All the records are reviewed and case discussed with Care Management/Social Worker. Management plans discussed with the patient, family and they are in agreement.  CODE STATUS: Full code  DVT Prophylaxis: Lovenox  TOTAL TIME TAKING CARE OF THIS PATIENT: 30 minutes.   POSSIBLE D/C IN 1-2 DAYS, DEPENDING ON CLINICAL CONDITION.   Henreitta Leber M.D on 04/03/2019 at 2:37 PM  Between 7am to 6pm - Pager - [phone]  After 6pm go to [email or web address] - [employer] [facility]  Office  [phone]  CC: Primary care physician; Medicine, [place]

## 2019-04-03 NOTE — Evaluation (Signed)
Speech Language Pathology Evaluation Patient Details Name: Larry Terrell MRN: [id number] DOB: Dec 02, 1960 Today's Date: 04/03/2019 Time: 1000-1100 SLP Time Calculation (min) (ACUTE ONLY): 60 min  Problem List:  Patient Active Problem List   Diagnosis Date Noted  . Stroke ([place]) 04/01/2019  . NSTEMI (non-ST elevated myocardial infarction) ([place]) 08/06/2017  . Acute appendicitis 07/10/2015  . S/P laparoscopic appendectomy 07/10/2015  . Appendicitis, acute   . Diabetes mellitus with complication ([place])   . Chronic congestive heart failure with left ventricular diastolic dysfunction ([place])   . Lactic acidosis   . Acute respiratory failure with hypoxia [facility])    Past Medical History:  Past Medical History:  Diagnosis Date  . CHF (congestive heart failure) (HCC)    diastolic (echo at [facility] 2831) EF 55-60%  . Cirrhosis ([place])    NASH per records  . COPD (chronic obstructive pulmonary disease) ([place])   . Depression   . Diabetes mellitus with neuropathy ([place])   . Heart attack ([place])   . Neuropathy   . OSA (obstructive sleep apnea)   . Peripheral vascular disease due to secondary diabetes mellitus ([place])   . Splenomegaly   . Temporal giant cell arteritis ([place])   . TIA (transient ischemic attack) April 2016   Past Surgical History:  Past Surgical History:  Procedure Laterality Date  . AORTA - FEMORAL ARTERY BYPASS GRAFT Bilateral   . CARDIAC CATHETERIZATION    . cardiac stents  July 2015   . CORONARY STENT INTERVENTION N/A 08/09/2017   Procedure: CORONARY STENT INTERVENTION;  Surgeon: Wellington Hampshire, MD;  Location: [place] [facility];  Service: Cardiovascular;  Laterality: N/A;  . LAPAROSCOPIC APPENDECTOMY N/A 07/10/2015   Procedure: APPENDECTOMY LAPAROSCOPIC;  Surgeon: Rolm Bookbinder, MD;  Location: [place];  Service: General;  Laterality: N/A;  . LEFT HEART CATH AND CORONARY ANGIOGRAPHY N/A 08/09/2017   Procedure: LEFT HEART CATH AND CORONARY ANGIOGRAPHY;  Surgeon: Wellington Hampshire,  MD;  Location: [place] [facility];  Service: Cardiovascular;  Laterality: N/A;   HPI:  Patient is a pleasant 58 year old male who has a PMH of Heart attack, CHF, Neuropathy, COPD, DM with neuropathy, temporal giant cell atertitis, depression, OSA, PVD, cirrhosis, TIA (2016), spelnomegaly, and episodic ataxia admitted for slurred spead and unsteady gait. Imaging negative for acute infarct however shows severe intracranial atherosclerosis.  Pt lives w/ family at home who assist him.    Assessment / Plan / Recommendation Clinical Impression  Pt appears to present w/ Cognitive-linguistic deficits w/ declined presentation w/ some of the tasks on the [facility] Cognitive Assessment(B). He exhibited more challenges in the areas of Executive Functioning, Fluency, Attention, and Memory - pt endorsed Baseline deficits w/ Memory and recall of new information. Per chart Imaging and pt's report, pt has severe intracranial atherosclerosis and giant cell atertitis baseline - "I've always had these episodes and my memory ain't good"; he also stated he "repeats things back backward sometimes to my brother when he tells me something". When given verbal cues, pt increased performance and recall w/ Fluency and Memory tasks. During Attention tasks, pt was able to focus but was easily distracted in the hospital environment. He often engaged in conversation during tasks. Intermittent paraphasias noted but pt self-corrected. Pt exhibited strengths in general Orientation of day/surroundings, Naming, and basic Abstraction and Money tasks using dollar bills to pay for an item. As tasks moved from Basic to more Complex at bedside assessment, pt's accuracy decreased. During Motor Speech tasks, pt is Missing  Dentition which can impair intelligibility of speech slightly. Pt verbally communicated at sentence and conversation level w/ adequate articulation of speech. Min decline in articulation precision and min reduced intelligibility noted  at times; unsure if new deficits from baseline. Pt declined any new deficits but stated he felt his speech "was not jumbled up like it was yesterday some". Pt has some HS education. He lives at home w/ brother and cousin who assist in his care per report. Due to not having family present to determine if any new baseline deficits/decline, recommend pt and family f/u w/ pt's PCP post discharge home if new questions/concerns re: pt's Cognitive-linguistic skills; pt can be referred for Home Health services if indicated then. Discussed and recommended to pt to slow down and take his time during conversation; Write down information to be remembered; talk over information w/ his brother/cousin to ensure accuracy; reduce distractions during tasks to allow for best focus. Pt agreed.     SLP Assessment  SLP Recommendation/Assessment: All further Speech [place] Pathology  needs can be addressed in the next venue of care(if determined indicated by pt/family and PCP) SLP Visit Diagnosis: Cognitive communication deficit (R41.841)    Follow Up Recommendations  (TBD post d/c home by family/PCP)    Frequency and Duration (n/a)  (n/a)      SLP Evaluation Cognition  Overall Cognitive Status: No family/caregiver present to determine baseline cognitive functioning Arousal/Alertness: Awake/alert Orientation Level: Oriented to person;Oriented to place;Oriented to situation;Oriented to time(but not to the Year) Attention: Focused;Sustained Focused Attention: Appears intact Sustained Attention: Impaired(distracted at times) Sustained Attention Impairment: Verbal complex;Functional complex Memory: Impaired Memory Impairment: Decreased short term memory(pt reported baseline deficits) Decreased Short Term Memory: Verbal basic;Functional basic Awareness: Appears intact(for basic tasks) Problem Solving: Appears intact(for basic tasks) Executive Function: Reasoning;Sequencing;Organizing Reasoning: Impaired Reasoning  Impairment: Verbal complex;Functional complex Sequencing: Impaired Sequencing Impairment: Verbal complex;Functional complex Organizing: Impaired Organizing Impairment: Verbal complex;Functional complex Behaviors: (disracted easily) Safety/Judgment: Appears intact(for basic tasks)       Comprehension  Auditory Comprehension Overall Auditory Comprehension: (unsure of pt's baseline - no family present) Yes/No Questions: Within Functional Limits(basic ) Commands: Within Functional Limits(1-2 step) Conversation: Simple Other Conversation Comments: at sentence and in conversation, noted min breakdown w/ paraphasias - pt stated he felt his speech "got jumbled up yesterday" but that it was better today Visual Recognition/Discrimination Discrimination: Not tested Reading Comprehension Reading Status: Not tested(no glasses)    Expression Expression Primary Mode of Expression: Verbal Verbal Expression Overall Verbal Expression: (unsure of pt's baseline - no family present) Automatic Speech: Name;Social Response;Counting;Day of week Level of Generative/Spontaneous Verbalization: Sentence Repetition: No impairment Naming: No impairment Pragmatics: Unable to assess(no family present) Interfering Components: Attention(distracted) Non-Verbal Means of Communication: Not applicable Other Verbal Expression Comments: min decreased articulation Written Expression Dominant Hand: Right Written Expression: Not tested   Oral / Motor  Oral Motor/Sensory Function Overall Oral Motor/Sensory Function: Within functional limits Motor Speech Overall Motor Speech: Impaired at baseline(unsure if new deficits from baseline) Respiration: Within functional limits(though pt gets SOB at baseline w/ any exertion; O2 added) Phonation: Normal Resonance: Within functional limits Articulation: Impaired(baseline deficits?) Level of Impairment: Word Intelligibility: Intelligibility reduced Motor Speech Errors: Not  applicable Interfering Components: Inadequate dentition Effective Techniques: Slow rate;Increased vocal intensity;Over-articulate   GO                      Orinda Kenner, MS, CCC-SLP Watson,Katherine 04/03/2019, 2:14 PM

## 2019-04-03 NOTE — Progress Notes (Signed)
SATURATION QUALIFICATIONS: (This note is used to comply with regulatory documentation for home oxygen)  Patient Saturations on Room Air at Rest = 100 %  Patient Saturations on Room Air while Ambulating = 84%  Patient Saturations on 2 Liters of oxygen while Ambulating = 96%  Please briefly explain why patient needs home oxygen:  COPD

## 2019-04-03 NOTE — Progress Notes (Signed)
SLP Cancellation Note  Patient Details Name: Larry Terrell MRN: [id number] DOB: September 11, 1960   Cancelled treatment:       Reason Eval/Treat Not Completed: SLP screened, no needs identified, will sign off(chart reviewed; consulted NSG re: pt's status). Pt denied any difficulty swallowing and is currently on a regular diet; tolerates swallowing pills w/ water per NSG. No further skilled ST services indicated as pt appears at his baseline. Pt agreed. NSG to reconsult if any change in status while admitted.      Orinda Kenner, MS, CCC-SLP Watson,Katherine 04/03/2019, 1:32 PM

## 2019-04-03 NOTE — Discharge Summary (Signed)
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 CHF (congestive heart failure) (HCC)    diastolic (echo at [facility] 1696) EF 55-60%  . Cirrhosis ([place])    NASH per records  . COPD (chronic obstructive pulmonary disease) ([place])   . Depression   . Diabetes mellitus with neuropathy ([place])   . Heart attack ([place])   . Neuropathy   . OSA (obstructive sleep apnea)   . Peripheral vascular disease due to secondary diabetes mellitus ([place])   . Splenomegaly   . Temporal giant cell arteritis ([place])   . TIA (transient ischemic attack) April 2016    HOSPITAL COURSE:   Patient is a 58 year old male with history of episodic ataxia for which she follows up at [facility] and diabetes mellitus admitted for further evaluation of TIA  1.TIA Patient presented with episode of slurred speech and ataxic gait.  -Symptoms significantly improved since admission.  He denies any worsening slurred speech or ataxia presently. MRI of the brain with no acute infarct. Patient does have known history of episodic ataxia for which he follows up with [facility] CT head and neck with no large vessel occlusion but evidence of severe intracranial atherosclerosis. -Discussed with neurology and based on the CT of the head & neck no plans for acute intervention presently as these are chronic findings.  -Patient will continue his dual antiplatelet therapy with aspirin and Plavix.  Patient's echocardiogram showed normal ejection fraction with no evidence of thrombus  or any other wall motion abnormalities. -Patient is clinically stable therefore being discharged home today.  PT and OT evaluated patient and recommended home health services which is being arranged for him.  2.Diabetes mellitus type 2-well in the hospital patient was maintained on his metformin and sliding scale insulin.  He will resume his 70/30 insulin and metformin upon discharge.  3.Tolerated hypertension-given patient's intracranial stenosis neurology recommended tolerating some element of elevated blood pressures.  Goal systolic blood pressures were  150-160. -Initially when patient presented to the hospital his systolic blood pressures were over 180-190.  Patient was maintained on her metoprolol, started on some low-dose clonidine and his blood pressure since then has improved.  He will be discharged on the metoprolol and clonidine as mentioned.  4. Hx of Seizures - no seizures while in the hospital.  - pt. Will cont. His Keppra.   5.  Diabetic neuropathy-patient will continue his Lyrica.  6.  BPH-patient will continue his Flomax.  No urinary retention while in the hospital.  DISCHARGE CONDITIONS:   Stable.   CONSULTS OBTAINED:  Treatment Team:  Neville Route, MD  DRUG ALLERGIES:   Allergies  Allergen Reactions  . Gabapentin   . Ace Inhibitors Cough    Other reaction(s): Other (See Comments) cough    DISCHARGE MEDICATIONS:   Allergies as of 04/03/2019      Reactions   Gabapentin    Ace Inhibitors Cough   Other reaction(s): Other (See Comments) cough      Medication List    STOP taking these  medications   traMADol 50 MG tablet Commonly known as: Ultram     TAKE these medications   aspirin 81 MG chewable tablet Chew 1 tablet (81 mg total) by mouth daily.   atorvastatin 40 MG tablet Commonly known as: LIPITOR Take 40 mg by mouth daily.   cloNIDine 0.1 MG tablet Commonly known as: CATAPRES Take 1 tablet (0.1 mg total) by mouth 2 (two) times  daily.   clopidogrel 75 MG tablet Commonly known as: PLAVIX Take 1 tablet (75 mg total) by mouth daily with breakfast.   famotidine 20 MG tablet Commonly known as: Pepcid Take 1 tablet (20 mg total) by mouth 2 (two) times daily.   insulin NPH-regular Human (70-30) 100 UNIT/ML injection Inject 20-24 Units into the skin 2 (two) times daily. Take 24units in the morning and 20units in the evening.   levETIRAcetam 250 MG tablet Commonly known as: KEPPRA Take 250 mg by mouth 3 (three) times daily.   metFORMIN 500 MG tablet Commonly known as: GLUCOPHAGE Take 500 mg by mouth 2 (two) times daily with a meal.   metoprolol tartrate 50 MG tablet Commonly known as: LOPRESSOR Take 50 mg by mouth 2 (two) times daily.   nitroGLYCERIN 0.4 MG SL tablet Commonly known as: NITROSTAT Place 0.4 mg under the tongue every 5 (five) minutes as needed for chest pain.   pregabalin 50 MG capsule Commonly known as: Lyrica Take 1 capsule (50 mg total) by mouth 3 (three) times daily.   tamsulosin 0.4 MG Caps capsule Commonly known as: FLOMAX Take 0.4 mg by mouth daily.   venlafaxine XR 75 MG 24 hr capsule Commonly known as: EFFEXOR-XR Take 225 mg by mouth daily.            Durable Medical Equipment  (From admission, onward)         Start     Ordered   04/03/19 1027  For home use only DME 3 n 1  Once     04/03/19 1027            DISCHARGE INSTRUCTIONS:   DIET:  Cardiac diet and Diabetic diet  DISCHARGE CONDITION:  Stable  ACTIVITY:  Activity as tolerated  OXYGEN:  Home Oxygen: No.   Oxygen Delivery: room air  DISCHARGE LOCATION:  Home with Home Health PT, OT, RN, Aide.    If you experience worsening of your admission symptoms, develop shortness of breath, life threatening emergency, suicidal or homicidal thoughts you must seek medical attention immediately by calling 911 or calling your MD immediately  if symptoms less severe.  You Must read complete  instructions/literature along with all the possible adverse reactions/side effects for all the Medicines you take and that have been prescribed to you. Take any new Medicines after you have completely understood and accpet all the possible adverse reactions/side effects.   Please note  You were cared for by a hospitalist during your hospital stay. If you have any questions about your discharge medications or the care you received while you were in the hospital after you are discharged, you can call the unit and asked to speak with the hospitalist on call if the hospitalist that took care of you is not available. Once you are discharged, your primary care physician will handle any further medical issues. Please note that NO REFILLS for any discharge medications will be authorized once you are discharged, as it is imperative that you return to your primary care physician (or establish a relationship with a primary  care physician if you do not have one) for your aftercare needs so that they can reassess your need for medications and monitor your lab values.     Today   Blood pressure improved since yesterday.  Denies any worsening slurred speech.  Will ambulate with PT prior to discharge today.  No other acute events overnight.  VITAL SIGNS:  Blood pressure (!) 169/81, pulse 75, temperature 97.7 F (36.5 C), temperature source Oral, resp. rate 17, height 5' 7"  (1.702 m), weight 89.4 kg, SpO2 96 %.  I/O:    Intake/Output Summary (Last 24 hours) at 04/03/2019 1132 Last data filed at 04/03/2019 0900 Gross per 24 hour  Intake 240 ml  Output 850 ml  Net -610 ml    PHYSICAL EXAMINATION:   GENERAL:  58 y.o.-year-old patient lying in bed in no acute distress.  EYES: Pupils equal, round, reactive to Monger and accommodation. No scleral icterus. Extraocular muscles intact.  HEENT: Head atraumatic, normocephalic. Oropharynx and nasopharynx clear.  NECK:  Supple, no jugular venous distention. No  thyroid enlargement, no tenderness.  LUNGS: Normal breath sounds bilaterally, no wheezing, rales, rhonchi. No use of accessory muscles of respiration.  CARDIOVASCULAR: S1, S2 normal. No murmurs, rubs, or gallops.  ABDOMEN: Soft, nontender, nondistended. Bowel sounds present. No organomegaly or mass.  EXTREMITIES: No cyanosis, clubbing or edema b/l.    NEUROLOGIC: Cranial nerves II through XII are intact. No focal Motor or sensory deficits b/l. Globally weak.    PSYCHIATRIC: The patient is alert and oriented x 3.  SKIN: No obvious rash, lesion, or ulcer.   DATA REVIEW:   CBC Recent Labs  Lab 04/01/19 1719  WBC 7.1  HGB 15.4  HCT 43.4  PLT 175    Chemistries  Recent Labs  Lab 04/01/19 1719  NA 135  K 4.4  CL 102  CO2 22  GLUCOSE 264*  BUN 18  CREATININE 1.17  CALCIUM 9.6  AST 100*  ALT 62*  ALKPHOS 101  BILITOT 1.4*    Cardiac Enzymes No results for input(s): TROPONINI in the last 168 hours.  Microbiology Results  Results for orders placed or performed during the hospital encounter of 04/01/19  SARS CORONAVIRUS 2 (TAT 6-24 HRS) Nasopharyngeal Nasopharyngeal Swab     Status: None   Collection Time: 04/01/19  6:55 PM   Specimen: Nasopharyngeal Swab  Result Value Ref Range Status   SARS Coronavirus 2 NEGATIVE NEGATIVE Final    Comment: (NOTE) SARS-CoV-2 target nucleic acids are NOT DETECTED. The SARS-CoV-2 RNA is generally detectable in upper and lower respiratory specimens during the acute phase of infection. Negative results do not preclude SARS-CoV-2 infection, do not rule out co-infections with other pathogens, and should not be used as the sole basis for treatment or other patient management decisions. Negative results must be combined with clinical observations, patient history, and epidemiological information. The expected result is Negative. Fact Sheet for Patients: SugarRoll.be Fact Sheet for Healthcare  Providers: https://www.woods-mathews.com/ This test is not yet approved or cleared by the [place] FDA and  has been authorized for detection and/or diagnosis of SARS-CoV-2 by FDA under an Emergency Use Authorization (EUA). This EUA will remain  in effect (meaning this test can be used) for the duration of the COVID-19 declaration under Section 56 4(b)(1) of the Act, 21 U.S.C. section 360bbb-3(b)(1), unless the authorization is terminated or revoked sooner. Performed at [facility], [place] [address], [place], [place] [address]     RADIOLOGY:  Ct Angio  Head W Or Wo Contrast  Result Date: 04/01/2019 CLINICAL DATA:  58 year old male with slurred speech. Unsteadiness and fall this morning. EXAM: CT ANGIOGRAPHY HEAD AND NECK TECHNIQUE: Multidetector CT imaging of the head and neck was performed using the standard protocol during bolus administration of intravenous contrast. Multiplanar CT image reconstructions and MIPs were obtained to evaluate the vascular anatomy. Carotid stenosis measurements (when applicable) are obtained utilizing NASCET criteria, using the distal internal carotid diameter as the denominator. CONTRAST:  85m OMNIPAQUE IOHEXOL 350 MG/ML SOLN COMPARISON:  Plain head CT 1743 hours today.  CTA chest 08/06/2017. FINDINGS: CTA NECK Skeleton: Flowing osteophytes in the lower cervical spine. No acute osseous abnormality identified. Upper chest: Patchy peribronchial opacity scattered in both upper lobes, mostly ground-glass. At the same time there is no confluent dependent opacity to suggest this is atelectasis. Although there are some atelectatic changes to the central airways. Visible mediastinal lymph nodes are within normal limits. Other neck: No acute findings. Aortic arch: 4 vessel arch configuration, the left vertebral arises directly from the arch. Mild for age arch atherosclerosis. Right carotid system: No brachiocephalic or right CCA origin stenosis despite  some plaque. Soft and calcified plaque at the right carotid bifurcation. Stenosis up to 60 % with respect to the distal vessel at the right ICA origin. Tortuous right ICA distal to the bulb with a mildly kinked appearance at the C1-C2 level, no additional stenosis to the skull base. Left carotid system: No left CCA origin stenosis despite plaque. Bulky soft plaque in the ventral left CCA at the level of the larynx without stenosis (series 5, image 58). Soft and calcified plaque at the left ICA origin without stenosis. Mildly tortuous or left ICA. The vessel remains patent to the skull base, but see details of the abnormal left ICA siphon below. Vertebral arteries: No proximal right subclavian artery stenosis despite some plaque. Normal right vertebral artery origin. Moderate stenosis of the right vertebral V2 segment at the C4-C5 level on series 7, image 134. The vessel remains patent to the skull base without additional stenosis. The left vertebral arises directly from the arch, no stenosis at its origin. As a late entry into the transverse foramen and is mildly dominant, remaining patent at the skull base without stenosis. CTA HEAD Posterior circulation: Bulky calcified plaque of the right vertebral V4 segment which is occluded just beyond the right PICA origin which remains patent on series 7, image 220. A portion of the distal right V4 is reconstituted (image 226). More dominant appearing left vertebral artery with V4 segment plaque and mild to moderate stenosis proximal to the vertebrobasilar junction. The left vertebral supplies the basilar. The basilar is patent with irregularity but no hemodynamically significant stenosis. Patent SCA and PCA origins. Both posterior communicating arteries are present. Severe stenosis of the left PCA P2 segment, which remains patent distally. Right PCA branches are within normal limits. Anterior circulation: High-grade stenosis of the petrous left ICA at the skull base  resulting in a radiographic string sign (series 7, image 224 and series 9, image 160). The vessel remains patent but is highly irregular with continued severe plaque and tandem radiographic string sign the cavernous segment on image 229. Superimposed severe stenosis of the proximal supraclinoid segment. Despite these the left ICA terminus remains patent. Normal left posterior communicating artery origin. Patent left MCA and ACA origins, mildly irregular. Left MCA M1 segment is diminutive but patent. Patent left MCA bifurcation, and no left MCA branch occlusion identified. Proximal right  ICA siphon is normal but there is extensive plaque in the cavernous segment with a high-grade stenosis, radiographic string sign on series 7, image 233. Continued severe plaque through the supraclinoid segment with moderate to severe additional supraclinoid stenosis (image 244). Right posterior communicating artery remains patent. Right ICA terminus, right MCA and ACA origins remain patent. Anterior communicating artery is diminutive or absent. ACA A1 segments and bilateral ACA branches are within normal limits. Right MCA M1 and trifurcation are patent. Right MCA branches are within normal limits. Venous sinuses: Patent. Anatomic variants: Dominant left vertebral artery which arises directly from the arch. Review of the MIP images confirms the above findings IMPRESSION: 1. Negative for large vessel occlusion, but positive for severe intracranial atherosclerosis with RADIOGRAPHIC STRING SIGN stenosis of both ICA siphons, worse on the left. There is a greater degree of soft plaque or thrombus in the left ICA siphon, and also the left CCA. 2. Segmental occlusion of the right vertebral artery V4 segment, distal to the right PICA origin which remains patent. 3. Additional hemodynamically significant stenoses: - right ICA origin (60%). - left vertebral artery V4 segment (up to moderate). - left PCA P2 segment (severe). 4. Nonspecific upper  lobe peribronchial ground-glass pulmonary opacity. Recommend correlation with chest radiographs when possible. Preliminary report of the above discussed by telephone with Dr. Blake Divine on 04/01/2019 at 18:11. We discussed follow-up CT Perfusion, versus stat DWI brain MRI. Electronically Signed   By: Genevie Ann M.D.   On: 04/01/2019 18:19   Ct Angio Neck W And/or Wo Contrast  Result Date: 04/01/2019 CLINICAL DATA:  58 year old male with slurred speech. Unsteadiness and fall this morning. EXAM: CT ANGIOGRAPHY HEAD AND NECK TECHNIQUE: Multidetector CT imaging of the head and neck was performed using the standard protocol during bolus administration of intravenous contrast. Multiplanar CT image reconstructions and MIPs were obtained to evaluate the vascular anatomy. Carotid stenosis measurements (when applicable) are obtained utilizing NASCET criteria, using the distal internal carotid diameter as the denominator. CONTRAST:  30m OMNIPAQUE IOHEXOL 350 MG/ML SOLN COMPARISON:  Plain head CT 1743 hours today.  CTA chest 08/06/2017. FINDINGS: CTA NECK Skeleton: Flowing osteophytes in the lower cervical spine. No acute osseous abnormality identified. Upper chest: Patchy peribronchial opacity scattered in both upper lobes, mostly ground-glass. At the same time there is no confluent dependent opacity to suggest this is atelectasis. Although there are some atelectatic changes to the central airways. Visible mediastinal lymph nodes are within normal limits. Other neck: No acute findings. Aortic arch: 4 vessel arch configuration, the left vertebral arises directly from the arch. Mild for age arch atherosclerosis. Right carotid system: No brachiocephalic or right CCA origin stenosis despite some plaque. Soft and calcified plaque at the right carotid bifurcation. Stenosis up to 60 % with respect to the distal vessel at the right ICA origin. Tortuous right ICA distal to the bulb with a mildly kinked appearance at the C1-C2  level, no additional stenosis to the skull base. Left carotid system: No left CCA origin stenosis despite plaque. Bulky soft plaque in the ventral left CCA at the level of the larynx without stenosis (series 5, image 58). Soft and calcified plaque at the left ICA origin without stenosis. Mildly tortuous or left ICA. The vessel remains patent to the skull base, but see details of the abnormal left ICA siphon below. Vertebral arteries: No proximal right subclavian artery stenosis despite some plaque. Normal right vertebral artery origin. Moderate stenosis of the right vertebral V2 segment at  the C4-C5 level on series 7, image 134. The vessel remains patent to the skull base without additional stenosis. The left vertebral arises directly from the arch, no stenosis at its origin. As a late entry into the transverse foramen and is mildly dominant, remaining patent at the skull base without stenosis. CTA HEAD Posterior circulation: Bulky calcified plaque of the right vertebral V4 segment which is occluded just beyond the right PICA origin which remains patent on series 7, image 220. A portion of the distal right V4 is reconstituted (image 226). More dominant appearing left vertebral artery with V4 segment plaque and mild to moderate stenosis proximal to the vertebrobasilar junction. The left vertebral supplies the basilar. The basilar is patent with irregularity but no hemodynamically significant stenosis. Patent SCA and PCA origins. Both posterior communicating arteries are present. Severe stenosis of the left PCA P2 segment, which remains patent distally. Right PCA branches are within normal limits. Anterior circulation: High-grade stenosis of the petrous left ICA at the skull base resulting in a radiographic string sign (series 7, image 224 and series 9, image 160). The vessel remains patent but is highly irregular with continued severe plaque and tandem radiographic string sign the cavernous segment on image 229.  Superimposed severe stenosis of the proximal supraclinoid segment. Despite these the left ICA terminus remains patent. Normal left posterior communicating artery origin. Patent left MCA and ACA origins, mildly irregular. Left MCA M1 segment is diminutive but patent. Patent left MCA bifurcation, and no left MCA branch occlusion identified. Proximal right ICA siphon is normal but there is extensive plaque in the cavernous segment with a high-grade stenosis, radiographic string sign on series 7, image 233. Continued severe plaque through the supraclinoid segment with moderate to severe additional supraclinoid stenosis (image 244). Right posterior communicating artery remains patent. Right ICA terminus, right MCA and ACA origins remain patent. Anterior communicating artery is diminutive or absent. ACA A1 segments and bilateral ACA branches are within normal limits. Right MCA M1 and trifurcation are patent. Right MCA branches are within normal limits. Venous sinuses: Patent. Anatomic variants: Dominant left vertebral artery which arises directly from the arch. Review of the MIP images confirms the above findings IMPRESSION: 1. Negative for large vessel occlusion, but positive for severe intracranial atherosclerosis with RADIOGRAPHIC STRING SIGN stenosis of both ICA siphons, worse on the left. There is a greater degree of soft plaque or thrombus in the left ICA siphon, and also the left CCA. 2. Segmental occlusion of the right vertebral artery V4 segment, distal to the right PICA origin which remains patent. 3. Additional hemodynamically significant stenoses: - right ICA origin (60%). - left vertebral artery V4 segment (up to moderate). - left PCA P2 segment (severe). 4. Nonspecific upper lobe peribronchial ground-glass pulmonary opacity. Recommend correlation with chest radiographs when possible. Preliminary report of the above discussed by telephone with Dr. Blake Divine on 04/01/2019 at 18:11. We discussed follow-up  CT Perfusion, versus stat DWI brain MRI. Electronically Signed   By: Genevie Ann M.D.   On: 04/01/2019 18:19   Mr Brain Wo Contrast  Result Date: 04/01/2019 CLINICAL DATA:  59 year old male code stroke presentation with slurred speech. CTA head and neck revealing widespread severe intracranial atherosclerosis. EXAM: MRI HEAD WITHOUT CONTRAST TECHNIQUE: Multiplanar, multiecho pulse sequences of the brain and surrounding structures were obtained without intravenous contrast. COMPARISON:  CT head, CTA head and neck earlier today. FINDINGS: Brain: No restricted diffusion or evidence of acute infarction. Mild for age scattered cerebral white matter  T2 and FLAIR hyperintensity, most pronounced in the periatrial white matter regions. No cortical encephalomalacia or chronic blood products identified. No restricted diffusion to suggest acute infarction. No midline shift, mass effect, evidence of mass lesion, ventriculomegaly, extra-axial collection or acute intracranial hemorrhage. Cervicomedullary junction and pituitary are within normal limits. Minor T2 heterogeneity in the deep gray matter nuclei and pons is new since 2016. There are tiny chronic infarcts in the right cerebellum which have not significantly changed on series 14, image 3. Vascular: Major intracranial vascular flow voids are diminished at proximal left ICA siphon, in the distal right vertebral artery corresponding to the CTA findings. Skull and upper cervical spine: Negative visible cervical spine. Visualized bone marrow signal is within normal limits. Sinuses/Orbits: Postoperative changes to the left globe, otherwise negative orbits. Paranasal sinuses are clear. Other: Mild right mastoid effusion. Negative visible nasopharynx. Left mastoids are clear. Scalp and face soft tissues are negative. IMPRESSION: 1. Negative for acute infarct. 2. Loss of the normal vascular flow voids in the proximal left ICA siphon and distal right Vertebral artery corresponding  to some of the CTA abnormalities today. 3. 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 Electronically Signed   By: Genevie Ann M.D.   On: 04/01/2019 19:58   Dg Chest Portable 1 View  Result Date: 04/01/2019 CLINICAL DATA:  Falls, unsteady gait, slurred speech EXAM: PORTABLE CHEST 1 VIEW COMPARISON:  03/22/2019 FINDINGS: Increased interstitial markings, chronic. No focal consolidation. No pleural effusion or pneumothorax. Mild cardiomegaly. IMPRESSION: No evidence of acute cardiopulmonary disease. Electronically Signed   By: Julian Hy M.D.   On: 04/01/2019 18:37   Ct Head Code Stroke Wo Contrast`  Addendum Date: 04/01/2019   ADDENDUM REPORT: 04/01/2019 18:19 ADDENDUM: Study discussed by telephone with Dr. Blake Divine on 04/01/2019 at 1800 hours. Electronically Signed   By: Genevie Ann M.D.   On: 04/01/2019 18:19   Result Date: 04/01/2019 CLINICAL DATA:  Code stroke. 58 year old male with slurred speech this afternoon. Falls and unsteady gait this morning. EXAM: CT HEAD WITHOUT CONTRAST TECHNIQUE: Contiguous axial images were obtained from the base of the skull through the vertex without intravenous contrast. COMPARISON:  Brain MRI 11/06/2014.  Head CT 11/19/2018. FINDINGS: Brain: No midline shift, mass effect, or evidence of intracranial mass lesion. No ventriculomegaly. No acute intracranial hemorrhage identified. Stable gray-white matter differentiation throughout the brain. Minimal to mild for age white matter hypodensity is stable. Small dystrophic calcifications in the deep cerebellar nuclei are stable. Lesser similar basal ganglia calcifications. No cortically based acute infarct identified. Vascular: Extensive Calcified atherosclerosis at the skull base. No suspicious intracranial vascular hyperdensity. Skull: No acute osseous abnormality identified.  Sinuses/Orbits: Visualized paranasal sinuses and mastoids are stable and well pneumatized. Other: Stable, negative orbit and scalp soft tissues. ASPECTS [facility] Stroke Program Early CT Score) Total score (0-10 with 10 being normal): 10 IMPRESSION: 1. No acute intracranial hemorrhage or cortically based infarct identified. ASPECTS 10. 2. Stable and largely negative for age noncontrast CT appearance of the brain. Electronically Signed: By: Genevie Ann M.D. On: 04/01/2019 17:55      Management plans discussed with the patient, family and they are in agreement.  CODE STATUS:     Code Status Orders  (From admission, onward)         Start     Ordered   04/01/19 2000  Full  code  Continuous     04/01/19 2000        TOTAL TIME TAKING CARE OF THIS PATIENT: 40 minutes.    Henreitta Leber M.D on 04/03/2019 at 11:32 AM  Between 7am to 6pm - Pager - [phone]  After 6pm go to [email or web address] - [facility]  Office  [phone]  CC: Primary care physician; Medicine, [place]

## 2019-04-04 LAB — CBC
HCT: 40.7 % (ref 39.0–52.0)
Hemoglobin: 14.4 g/dL (ref 13.0–17.0)
MCH: 31.9 pg (ref 26.0–34.0)
MCHC: 35.4 g/dL (ref 30.0–36.0)
MCV: 90.2 fL (ref 80.0–100.0)
Platelets: 148 10*3/uL — ABNORMAL LOW (ref 150–400)
RBC: 4.51 MIL/uL (ref 4.22–5.81)
RDW: 14.8 % (ref 11.5–15.5)
WBC: 8.2 10*3/uL (ref 4.0–10.5)
nRBC: 0 % (ref 0.0–0.2)

## 2019-04-04 LAB — BASIC METABOLIC PANEL
Anion gap: 9 (ref 5–15)
BUN: 23 mg/dL — ABNORMAL HIGH (ref 6–20)
CO2: 21 mmol/L — ABNORMAL LOW (ref 22–32)
Calcium: 8.7 mg/dL — ABNORMAL LOW (ref 8.9–10.3)
Chloride: 106 mmol/L (ref 98–111)
Creatinine, Ser: 1.2 mg/dL (ref 0.61–1.24)
GFR calc Af Amer: >60 mL/min (ref >60)
GFR calc non Af Amer: >60 mL/min (ref >60)
Glucose, Bld: 226 mg/dL — ABNORMAL HIGH (ref 70–99)
Potassium: 3.9 mmol/L (ref 3.5–5.1)
Sodium: 136 mmol/L (ref 135–145)

## 2019-04-04 LAB — GLUCOSE, CAPILLARY
Glucose-Capillary: 262 mg/dL — ABNORMAL HIGH (ref 70–99)
Glucose-Capillary: 273 mg/dL — ABNORMAL HIGH (ref 70–99)

## 2019-04-04 LAB — HIV ANTIBODY (ROUTINE TESTING W REFLEX): HIV Screen 4th Generation wRfx: NONREACTIVE

## 2019-04-04 NOTE — Progress Notes (Signed)
Physical Therapy Treatment Patient Details Name: Larry Terrell MRN: [id number] DOB: May 05, 1961 Today's Date: 04/04/2019    History of Present Illness Patient is a pleasant 58 year old male who has a PMH of Heart attack, CHF, Neuropathy, COPD, DM with neuropathy, temporal giant cell atertitis, depression, OSA, PVD, cirrhosis, TIA (2016), spelnomegaly, and episodic ataxia admitted for slurred spead and unsteady gait. Imaging negative for acute infarct however shows severe intracranial atherosclerosis.    PT Comments    Pt in bed, RN in room to do O2 sats with gait.  Pt Out of bed with min a and encouragement to attempt on his own.  Once sitting, tries to pull up on walker and needs verbal cues for correct hand placement.  He is able to walk with min a x 1 to nursing station and back.  He does report some dizziness which he then reports as general weakness.  Sats remain >89 during gait on room air.    Discussed discharge plan.  Pt stated his cousin lives with him along with a friend.  Cousin is able to help him walk and he feels comfortable with his ability to assist him.  Education provided that he needs +1 assist at all times and to use RW for balance.  He voiced understanding.  Discussed with RN and asked her to relay assistance needs to family upon discharge.     Follow Up Recommendations  Home health PT;Supervision for mobility/OOB     Equipment Recommendations  3in1 (PT)    Recommendations for Other Services       Precautions / Restrictions Precautions Precautions: Fall Restrictions Weight Bearing Restrictions: No    Mobility  Bed Mobility Overal bed mobility: Needs Assistance Bed Mobility: Supine to Sit;Sit to Supine     Supine to sit: Min assist Sit to supine: Supervision   General bed mobility comments: reachesf or writers hand and asks for assist OOB - encouraged to do without assist  Transfers Overall transfer level: Needs assistance Equipment used: Rolling  walker (2 wheeled) Transfers: Sit to/from Stand Sit to Stand: Min assist         General transfer comment: attemtps to pull up from walker, redirection to push up from bed needed after unsucessful attempts.  Ambulation/Gait Ambulation/Gait assistance: Min assist Gait Distance (Feet): 60 Feet Assistive device: Rolling walker (2 wheeled) Gait Pattern/deviations: Step-through pattern;Decreased step length - right;Decreased step length - left;Shuffle Gait velocity: decreased   General Gait Details: generally unsteady requires +1 fro safety.  stated cousin could provide assist at home.   Stairs             Wheelchair Mobility    Modified Rankin (Stroke Patients Only)       Balance Overall balance assessment: Needs assistance Sitting-balance support: Feet supported Sitting balance-Leahy Scale: Good     Standing balance support: Bilateral upper extremity supported;During functional activity Standing balance-Leahy Scale: Poor Standing balance comment: +1 assist needed for safety                            Cognition Arousal/Alertness: Awake/alert Behavior During Therapy: WFL for tasks assessed/performed Overall Cognitive Status: No family/caregiver present to determine baseline cognitive functioning                                 General Comments: Pt pleasant, generally able to follow 1-step commands consistently. Occasionally requiring  re-direction to topic/task at hand.      Exercises      General Comments        Pertinent Vitals/Pain Pain Assessment: No/denies pain Pain Intervention(s): Monitored during session    Home Living                      Prior Function            PT Goals (current goals can now be found in the care plan section) Progress towards PT goals: Progressing toward goals    Frequency    Min 2X/week      PT Plan Current plan remains appropriate    Co-evaluation               AM-PAC PT "6 Clicks" Mobility   Outcome Measure  Help needed turning from your back to your side while in a flat bed without using bedrails?: None Help needed moving from lying on your back to sitting on the side of a flat bed without using bedrails?: A Little Help needed moving to and from a bed to a chair (including a wheelchair)?: A Little Help needed standing up from a chair using your arms (e.g., wheelchair or bedside chair)?: A Little Help needed to walk in hospital room?: A Lot Help needed climbing 3-5 steps with a railing? : A Lot 6 Click Score: 17    End of Session Equipment Utilized During Treatment: Gait belt Activity Tolerance: Patient tolerated treatment well;Other (comment) Patient left: in bed;with call bell/phone within reach;with bed alarm set Nurse Communication: Mobility status;Other (comment)       Time: 8022-3361 PT Time Calculation (min) (ACUTE ONLY): 16 min  Charges:  $Gait Training: 8-22 mins                    Chesley Noon, PTA 04/04/19, 9:32 AM

## 2019-04-04 NOTE — Progress Notes (Signed)
Patient discharged home per MD order. All discharge instructions given and all questions answered. Prescription given to patient.

## 2019-04-04 NOTE — Progress Notes (Signed)
Inpatient Diabetes Program Recommendations  AACE/ADA: New Consensus Statement on Inpatient Glycemic Control (2015)  Target Ranges:  Prepandial:   less than 140 mg/dL      Peak postprandial:   less than 180 mg/dL (1-2 hours)      Critically ill patients:  140 - 180 mg/dL   Lab Results  Component Value Date   GLUCAP 262 (H) 04/04/2019   HGBA1C 7.8 (H) 04/02/2019    Review of Glycemic Control Results for DRAYVEN, MARCHENA (MRN [id number]) as of 04/04/2019 11:10  Ref. Range 04/03/2019 12:29 04/03/2019 17:07 04/03/2019 21:36 04/03/2019 22:46 04/04/2019 07:37  Glucose-Capillary Latest Ref Range: 70 - 99 mg/dL 262 (H) 206 (H) 424 (H) 273 (H) 262 (H)   Diabetes history: DM2 Outpatient Diabetes medications: Novolin 70/30 24 units am + 20 units pm + Metformin 500 mg bid Current orders for Inpatient glycemic control: Novolog sensitive correction tid + hs 0-5 units  Inpatient Diabetes Program Recommendations:   -Start Novolog 70/30 20 units bid  Thank you, Nani Gasser. Giorgia Wahler, RN, MSN, CDE  Diabetes Coordinator Inpatient Glycemic Control Team Team Pager [phone] (8am-5pm) 04/04/2019 11:10 AM

## 2019-04-04 NOTE — TOC Transition Note (Signed)
Transition of Care [facility]) - CM/SW Discharge Note   Patient Details  Name: Larry Terrell MRN: [id number] Date of Birth: 02-04-1961  [facility] CM/SW Contact:  Shelbie Hutching, RN Phone Number: 04/04/2019, 11:45 AM   Clinical Narrative:    Patient is ready for discharge today.  Patient did very well with PT today, he walked to the nurses station and back to his room on room air and did not drop oxygen sats below 89%.  Patient at this time does not qualify for home O2.  Home Health will follow patient after discharge and patient has a PCP appointment on 9/28.  Floydene Flock with [place] notified of discharge today.  Patient's brother will provide transportation home.    Final next level of care: Home w Home Health Services Barriers to Discharge: Barriers Resolved   Patient Goals and CMS Choice   CMS Medicare.gov Compare Post Acute Care list provided to:: Patient Choice offered to / list presented to : Patient  Discharge Placement                       Discharge Plan and Services   Discharge Planning Services: CM Consult Post Acute Care Choice: Home Health          DME Arranged: 3-N-1 DME Agency: [employer] Date DME Agency Contacted: 04/03/19 Time DME Agency Contacted: [phone]   [place] Arranged: RN, PT, OT, Nurse's Aide [place] Agency: [place] ([place]) Date [place]: 04/04/19 Time [place]: [id number] Representative spoke with at [place]: [place] (SDOH) Interventions     Readmission Risk Interventions No flowsheet data found.

## 2019-04-04 NOTE — Care Management Important Message (Signed)
Important Message  Patient Details  Name: Larry Terrell MRN: [id number] Date of Birth: 11-05-1960   Medicare Important Message Given:  Yes     [name] Strike 04/04/2019, 10:53 AM

## 2019-04-17 ENCOUNTER — Emergency Department: Payer: Medicare Other

## 2019-04-17 ENCOUNTER — Emergency Department
Admission: EM | Admit: 2019-04-17 | Discharge: 2019-04-17 | Disposition: A | Discharge status: Home / Self Care | Payer: Medicare Other | Attending: Emergency Medicine | Admitting: Emergency Medicine

## 2019-04-17 ENCOUNTER — Encounter: Payer: Self-pay | Admitting: *Deleted

## 2019-04-17 ENCOUNTER — Other Ambulatory Visit: Payer: Self-pay

## 2019-04-17 DIAGNOSIS — Z7982 Long term (current) use of aspirin: Secondary | ICD-10-CM | POA: Insufficient documentation

## 2019-04-17 DIAGNOSIS — J449 Chronic obstructive pulmonary disease, unspecified: Secondary | ICD-10-CM | POA: Insufficient documentation

## 2019-04-17 DIAGNOSIS — R531 Weakness: Secondary | ICD-10-CM | POA: Diagnosis present

## 2019-04-17 DIAGNOSIS — E1165 Type 2 diabetes mellitus with hyperglycemia: Secondary | ICD-10-CM | POA: Insufficient documentation

## 2019-04-17 DIAGNOSIS — Z794 Long term (current) use of insulin: Secondary | ICD-10-CM | POA: Diagnosis not present

## 2019-04-17 DIAGNOSIS — R739 Hyperglycemia, unspecified: Secondary | ICD-10-CM

## 2019-04-17 DIAGNOSIS — W19XXXA Unspecified fall, initial encounter: Secondary | ICD-10-CM

## 2019-04-17 DIAGNOSIS — Z87891 Personal history of nicotine dependence: Secondary | ICD-10-CM | POA: Diagnosis not present

## 2019-04-17 DIAGNOSIS — R0789 Other chest pain: Secondary | ICD-10-CM | POA: Diagnosis not present

## 2019-04-17 DIAGNOSIS — W01190A Fall on same level from slipping, tripping and stumbling with subsequent striking against furniture, initial encounter: Secondary | ICD-10-CM | POA: Diagnosis not present

## 2019-04-17 DIAGNOSIS — I5030 Unspecified diastolic (congestive) heart failure: Secondary | ICD-10-CM | POA: Diagnosis not present

## 2019-04-17 DIAGNOSIS — Z79899 Other long term (current) drug therapy: Secondary | ICD-10-CM | POA: Diagnosis not present

## 2019-04-17 LAB — CBC
HCT: 41.8 % (ref 39.0–52.0)
Hemoglobin: 14.7 g/dL (ref 13.0–17.0)
MCH: 31.9 pg (ref 26.0–34.0)
MCHC: 35.2 g/dL (ref 30.0–36.0)
MCV: 90.7 fL (ref 80.0–100.0)
Platelets: 170 10*3/uL (ref 150–400)
RBC: 4.61 MIL/uL (ref 4.22–5.81)
RDW: 15.9 % — ABNORMAL HIGH (ref 11.5–15.5)
WBC: 7.9 10*3/uL (ref 4.0–10.5)
nRBC: 0 % (ref 0.0–0.2)

## 2019-04-17 LAB — BASIC METABOLIC PANEL
Anion gap: 10 (ref 5–15)
BUN: 17 mg/dL (ref 6–20)
CO2: 22 mmol/L (ref 22–32)
Calcium: 9.3 mg/dL (ref 8.9–10.3)
Chloride: 99 mmol/L (ref 98–111)
Creatinine, Ser: 1.28 mg/dL — ABNORMAL HIGH (ref 0.61–1.24)
GFR calc Af Amer: >60 mL/min (ref >60)
GFR calc non Af Amer: >60 mL/min (ref >60)
Glucose, Bld: 384 mg/dL — ABNORMAL HIGH (ref 70–99)
Potassium: 4 mmol/L (ref 3.5–5.1)
Sodium: 131 mmol/L — ABNORMAL LOW (ref 135–145)

## 2019-04-17 LAB — GLUCOSE, CAPILLARY
Glucose-Capillary: 372 mg/dL — ABNORMAL HIGH (ref 70–99)
Glucose-Capillary: 286 mg/dL — ABNORMAL HIGH (ref 70–99)

## 2019-04-17 MED ORDER — INSULIN ASPART 100 UNIT/ML ~~LOC~~ SOLN
4.0000 [IU] | Freq: Once | SUBCUTANEOUS | Status: AC
  Administered 2019-04-17: 4 [IU] via INTRAVENOUS
  Filled 2019-04-17: qty 0.04
  Filled 2019-04-17: qty 1

## 2019-04-17 MED ORDER — SODIUM CHLORIDE 0.9 % IV BOLUS
1000.0000 mL | Freq: Once | INTRAVENOUS | Status: AC
  Administered 2019-04-17: 1000 mL via INTRAVENOUS

## 2019-04-17 NOTE — ED Provider Notes (Signed)
[facility] Emergency Department Provider Note  Time seen: 8:15 PM  I have reviewed the triage vital signs and the nursing notes.   HISTORY  Chief Complaint Fall and Weakness   HPI Larry Terrell is a 58 y.o. male with a past medical history of CHF, COPD, diabetes, presents to the emergency department after a fall.  According to the patient around 6 PM tonight he became somewhat dizzy and had a fall hitting a chair on his left ribs.  Patient states pain to the area he was concerned so he came to the emergency department for evaluation.  Patient denies any fever cough.  Denies any shortness of breath.  Denies any abdominal pain.  States mild pain in the left lower ribs, somewhat worse with movement..   Past Medical History:  Diagnosis Date  . CHF (congestive heart failure) (HCC)    diastolic (echo at [facility] 1287) EF 55-60%  . Cirrhosis ([place])    NASH per records  . COPD (chronic obstructive pulmonary disease) ([place])   . Depression   . Diabetes mellitus with neuropathy ([place])   . Heart attack ([place])   . Neuropathy   . OSA (obstructive sleep apnea)   . Peripheral vascular disease due to secondary diabetes mellitus ([place])   . Splenomegaly   . Temporal giant cell arteritis ([place])   . TIA (transient ischemic attack) April 2016    Patient Active Problem List   Diagnosis Date Noted  . Stroke ([place]) 04/01/2019  . NSTEMI (non-ST elevated myocardial infarction) ([place]) 08/06/2017  . Acute appendicitis 07/10/2015  . S/P laparoscopic appendectomy 07/10/2015  . Appendicitis, acute   . Diabetes mellitus with complication ([place])   . Chronic congestive heart failure with left ventricular diastolic dysfunction ([place])   . Lactic acidosis   . Acute respiratory failure with hypoxia [facility])     Past Surgical History:  Procedure Laterality Date  . AORTA - FEMORAL ARTERY BYPASS GRAFT Bilateral   . CARDIAC CATHETERIZATION    . cardiac stents  July 2015   . CORONARY STENT  INTERVENTION N/A 08/09/2017   Procedure: CORONARY STENT INTERVENTION;  Surgeon: Wellington Hampshire, MD;  Location: [place] CV [facility];  Service: Cardiovascular;  Laterality: N/A;  . LAPAROSCOPIC APPENDECTOMY N/A 07/10/2015   Procedure: APPENDECTOMY LAPAROSCOPIC;  Surgeon: Rolm Bookbinder, MD;  Location: [place];  Service: General;  Laterality: N/A;  . LEFT HEART CATH AND CORONARY ANGIOGRAPHY N/A 08/09/2017   Procedure: LEFT HEART CATH AND CORONARY ANGIOGRAPHY;  Surgeon: Wellington Hampshire, MD;  Location: [place] CV [facility];  Service: Cardiovascular;  Laterality: N/A;    Prior to Admission medications   Medication Sig Start Date End Date Taking? Authorizing Provider  aspirin 81 MG chewable tablet Chew 1 tablet (81 mg total) by mouth daily. 08/11/17   Fritzi Mandes, MD  atorvastatin (LIPITOR) 40 MG tablet Take 40 mg by mouth daily.     [provider]  cloNIDine (CATAPRES) 0.1 MG tablet Take 1 tablet (0.1 mg total) by mouth 2 (two) times daily. 04/03/19 06/02/19  Henreitta Leber, MD  clopidogrel (PLAVIX) 75 MG tablet Take 1 tablet (75 mg total) by mouth daily with breakfast. 08/11/17   Fritzi Mandes, MD  famotidine (PEPCID) 20 MG tablet Take 1 tablet (20 mg total) by mouth 2 (two) times daily. 12/26/17   Paulette Blanch, MD  insulin NPH-regular Human (NOVOLIN 70/30) (70-30) 100 UNIT/ML injection Inject 20-24 Units into the skin 2 (two) times daily. Take 24units in  the morning and 20units in the evening.    [provider]  levETIRAcetam (KEPPRA) 250 MG tablet Take 250 mg by mouth 3 (three) times daily. 03/19/19   [provider]  metFORMIN (GLUCOPHAGE) 500 MG tablet Take 500 mg by mouth 2 (two) times daily with a meal.    [provider]  metoprolol (LOPRESSOR) 50 MG tablet Take 50 mg by mouth 2 (two) times daily.    [provider]  nitroGLYCERIN (NITROSTAT) 0.4 MG SL tablet Place 0.4 mg under the tongue every 5 (five) minutes as needed for chest pain.    [provider]  pregabalin (LYRICA) 50 MG capsule Take 1 capsule (50 mg total) by mouth 3 (three) times daily. 03/22/19 03/21/20  Earleen Newport, MD  tamsulosin (FLOMAX) 0.4 MG CAPS capsule Take 0.4 mg by mouth daily. 10/24/18   [provider]  venlafaxine XR (EFFEXOR-XR) 75 MG 24 hr capsule Take 225 mg by mouth daily. 12/31/18   [provider]    Allergies  Allergen Reactions  . Gabapentin   . Ace Inhibitors Cough    Other reaction(s): Other (See Comments) cough    Family History  Problem Relation Age of Onset  . Heart attack Mother 73  . Stroke Mother   . Heart attack Father   . Alzheimer's disease Father     Social History Social History   Tobacco Use  . Smoking status: Former Smoker    Types: Cigarettes    Quit date: 2009    Years since quitting: 11.7  . Smokeless tobacco: Never Used  Substance Use Topics  . Alcohol use: No    Alcohol/week: 0.0 standard drinks  . Drug use: No    Review of Systems Constitutional: Negative for fever Cardiovascular: Left lower chest wall pain. Respiratory: Negative for shortness of breath.  Negative for cough. Gastrointestinal: Negative for abdominal pain Musculoskeletal: Negative for musculoskeletal complaints Neurological: Negative for headache All other ROS negative  ____________________________________________   PHYSICAL EXAM:  VITAL SIGNS: ED Triage Vitals [04/17/19 1843]  Enc Vitals Group     BP (!) 193/88     Pulse Rate 80     Resp 16     Temp 97.7 F (36.5 C)     Temp Source Oral     SpO2 95 %     Weight 203 lb (92.1 kg)     Height 5' 7"  (1.702 m)     Head Circumference      Peak Flow      Pain Score 5     Pain Loc      Pain Edu?      Excl. in [place]?    Constitutional: Alert and oriented. Well appearing and in no distress. Eyes: Normal exam ENT      Head: Normocephalic and atraumatic.      Mouth/Throat: Mucous membranes are moist. Cardiovascular: Normal rate, regular rhythm.   Respiratory: Normal respiratory effort without tachypnea nor retractions. Breath sounds are clear  Gastrointestinal: Soft and nontender. No distention. Musculoskeletal: Nontender with normal range of motion in all extremities.  Neurologic:  Normal speech and language. No gross focal neurologic deficits  Skin:  Skin is warm, dry and intact.  Psychiatric: Mood and affect are normal.   ____________________________________________    EKG  EKG viewed and interpreted by myself shows a sinus rhythm at 75 bpm with a narrow QRS, left axis deviation, slight PR prolongation consistent with first-degree AV block, nonspecific ST changes without ST  elevation.  ____________________________________________    RADIOLOGY  Chest x-ray shows cardiomegaly without acute disease.  ____________________________________________   INITIAL IMPRESSION / ASSESSMENT AND PLAN / ED COURSE  Pertinent labs & imaging results that were available during my care of the patient were reviewed by me and considered in my medical decision making (see chart for details).   Patient presents emergency department after a fall with left lower chest pain.  Patient does have mild tenderness over this area on examination.  Differential would include rib contusion, rib fracture.  We will obtain x-ray imaging to evaluate.  Patient also states dizziness earlier tonight we will check labs.  Patient's labs have resulted showing hyperglycemia.  Patient states he has an appointment with his doctor this week to discuss his diabetic regimen.  We will dose insulin and IV fluids in the emergency department.  Patient agreeable plan of care.  Larry Terrell was evaluated in Emergency Department on 04/17/2019 for the symptoms described in the history of present illness. He was evaluated in the context of the global COVID-19 pandemic, which necessitated consideration that the patient might be at risk for infection with the SARS-CoV-2 virus that  causes COVID-19. Institutional protocols and algorithms that pertain to the evaluation of patients at risk for COVID-19 are in a state of rapid change based on information released by regulatory bodies including the CDC and federal and state organizations. These policies and algorithms were followed during the patient's care in the ED.  ____________________________________________   FINAL CLINICAL IMPRESSION(S) / ED DIAGNOSES  Dizziness Chest pain   Harvest Dark, MD 04/17/19 2017

## 2019-04-17 NOTE — ED Triage Notes (Signed)
Pt to ED reporting a fall this afternoon where he fell into a rocking chair. Pt hit his chest and verbalized concern for ribs and "my oversized spleen." Pt unsure if he became dizzy and fell or tripped but reports, "Everything happened so fast" and reports he is feeling lightheaded and weak at this time. Pt so reporting bilateral foot pain that is making it harder for him to ambulate. NO LOC or head injury per pt.

## 2019-04-17 NOTE — Discharge Instructions (Addendum)
As we discussed please drink plenty of non-sugary fluids.  Please follow-up with your doctor within the next 2 days for recheck/reevaluation and to discuss your diabetic medication regimen.  Return to the emergency department for any symptoms personally concerning to yourself.

## 2019-06-04 ENCOUNTER — Encounter: Payer: Self-pay | Admitting: Emergency Medicine

## 2019-06-04 ENCOUNTER — Emergency Department: Payer: Medicare Other

## 2019-06-04 ENCOUNTER — Inpatient Hospital Stay
Admission: EM | Admit: 2019-06-04 | Discharge: 2019-06-07 | DRG: 871 | Disposition: A | Discharge status: Home / Self Care | Payer: Medicare Other | Attending: Internal Medicine | Admitting: Internal Medicine

## 2019-06-04 ENCOUNTER — Other Ambulatory Visit: Payer: Self-pay

## 2019-06-04 DIAGNOSIS — J9601 Acute respiratory failure with hypoxia: Secondary | ICD-10-CM | POA: Diagnosis present

## 2019-06-04 DIAGNOSIS — Z95820 Peripheral vascular angioplasty status with implants and grafts: Secondary | ICD-10-CM

## 2019-06-04 DIAGNOSIS — E1169 Type 2 diabetes mellitus with other specified complication: Secondary | ICD-10-CM | POA: Diagnosis not present

## 2019-06-04 DIAGNOSIS — Z87891 Personal history of nicotine dependence: Secondary | ICD-10-CM | POA: Diagnosis not present

## 2019-06-04 DIAGNOSIS — Z8673 Personal history of transient ischemic attack (TIA), and cerebral infarction without residual deficits: Secondary | ICD-10-CM | POA: Diagnosis not present

## 2019-06-04 DIAGNOSIS — G40909 Epilepsy, unspecified, not intractable, without status epilepticus: Secondary | ICD-10-CM | POA: Diagnosis present

## 2019-06-04 DIAGNOSIS — Z794 Long term (current) use of insulin: Secondary | ICD-10-CM

## 2019-06-04 DIAGNOSIS — R32 Unspecified urinary incontinence: Secondary | ICD-10-CM | POA: Diagnosis present

## 2019-06-04 DIAGNOSIS — G4733 Obstructive sleep apnea (adult) (pediatric): Secondary | ICD-10-CM

## 2019-06-04 DIAGNOSIS — Z20828 Contact with and (suspected) exposure to other viral communicable diseases: Secondary | ICD-10-CM | POA: Diagnosis present

## 2019-06-04 DIAGNOSIS — Z888 Allergy status to other drugs, medicaments and biological substances status: Secondary | ICD-10-CM

## 2019-06-04 DIAGNOSIS — Z7982 Long term (current) use of aspirin: Secondary | ICD-10-CM

## 2019-06-04 DIAGNOSIS — N179 Acute kidney failure, unspecified: Secondary | ICD-10-CM | POA: Diagnosis present

## 2019-06-04 DIAGNOSIS — R6520 Severe sepsis without septic shock: Secondary | ICD-10-CM | POA: Diagnosis not present

## 2019-06-04 DIAGNOSIS — E785 Hyperlipidemia, unspecified: Secondary | ICD-10-CM | POA: Diagnosis not present

## 2019-06-04 DIAGNOSIS — R197 Diarrhea, unspecified: Secondary | ICD-10-CM | POA: Diagnosis present

## 2019-06-04 DIAGNOSIS — I5032 Chronic diastolic (congestive) heart failure: Secondary | ICD-10-CM | POA: Diagnosis present

## 2019-06-04 DIAGNOSIS — Z823 Family history of stroke: Secondary | ICD-10-CM

## 2019-06-04 DIAGNOSIS — Z7902 Long term (current) use of antithrombotics/antiplatelets: Secondary | ICD-10-CM

## 2019-06-04 DIAGNOSIS — I10 Essential (primary) hypertension: Secondary | ICD-10-CM | POA: Diagnosis not present

## 2019-06-04 DIAGNOSIS — I5023 Acute on chronic systolic (congestive) heart failure: Secondary | ICD-10-CM | POA: Diagnosis present

## 2019-06-04 DIAGNOSIS — I2510 Atherosclerotic heart disease of native coronary artery without angina pectoris: Secondary | ICD-10-CM | POA: Diagnosis present

## 2019-06-04 DIAGNOSIS — I110 Hypertensive heart disease with heart failure: Secondary | ICD-10-CM | POA: Diagnosis present

## 2019-06-04 DIAGNOSIS — K219 Gastro-esophageal reflux disease without esophagitis: Secondary | ICD-10-CM | POA: Diagnosis present

## 2019-06-04 DIAGNOSIS — Z8249 Family history of ischemic heart disease and other diseases of the circulatory system: Secondary | ICD-10-CM | POA: Diagnosis not present

## 2019-06-04 DIAGNOSIS — Z820 Family history of epilepsy and other diseases of the nervous system: Secondary | ICD-10-CM

## 2019-06-04 DIAGNOSIS — E118 Type 2 diabetes mellitus with unspecified complications: Secondary | ICD-10-CM | POA: Diagnosis not present

## 2019-06-04 DIAGNOSIS — K7460 Unspecified cirrhosis of liver: Secondary | ICD-10-CM | POA: Diagnosis present

## 2019-06-04 DIAGNOSIS — Z87898 Personal history of other specified conditions: Secondary | ICD-10-CM

## 2019-06-04 DIAGNOSIS — R4182 Altered mental status, unspecified: Secondary | ICD-10-CM | POA: Diagnosis present

## 2019-06-04 DIAGNOSIS — I252 Old myocardial infarction: Secondary | ICD-10-CM

## 2019-06-04 DIAGNOSIS — Z955 Presence of coronary angioplasty implant and graft: Secondary | ICD-10-CM | POA: Diagnosis not present

## 2019-06-04 DIAGNOSIS — J449 Chronic obstructive pulmonary disease, unspecified: Secondary | ICD-10-CM | POA: Diagnosis present

## 2019-06-04 DIAGNOSIS — Z79899 Other long term (current) drug therapy: Secondary | ICD-10-CM

## 2019-06-04 DIAGNOSIS — R410 Disorientation, unspecified: Secondary | ICD-10-CM | POA: Diagnosis not present

## 2019-06-04 DIAGNOSIS — J9622 Acute and chronic respiratory failure with hypercapnia: Secondary | ICD-10-CM | POA: Diagnosis present

## 2019-06-04 DIAGNOSIS — Z9049 Acquired absence of other specified parts of digestive tract: Secondary | ICD-10-CM | POA: Diagnosis not present

## 2019-06-04 DIAGNOSIS — E1151 Type 2 diabetes mellitus with diabetic peripheral angiopathy without gangrene: Secondary | ICD-10-CM | POA: Diagnosis present

## 2019-06-04 DIAGNOSIS — R0602 Shortness of breath: Secondary | ICD-10-CM

## 2019-06-04 DIAGNOSIS — F329 Major depressive disorder, single episode, unspecified: Secondary | ICD-10-CM | POA: Diagnosis present

## 2019-06-04 DIAGNOSIS — R404 Transient alteration of awareness: Secondary | ICD-10-CM | POA: Diagnosis not present

## 2019-06-04 DIAGNOSIS — A419 Sepsis, unspecified organism: Secondary | ICD-10-CM | POA: Diagnosis not present

## 2019-06-04 DIAGNOSIS — I639 Cerebral infarction, unspecified: Secondary | ICD-10-CM | POA: Diagnosis present

## 2019-06-04 DIAGNOSIS — K08409 Partial loss of teeth, unspecified cause, unspecified class: Secondary | ICD-10-CM | POA: Diagnosis present

## 2019-06-04 DIAGNOSIS — J9621 Acute and chronic respiratory failure with hypoxia: Secondary | ICD-10-CM | POA: Diagnosis present

## 2019-06-04 DIAGNOSIS — R6510 Systemic inflammatory response syndrome (SIRS) of non-infectious origin without acute organ dysfunction: Secondary | ICD-10-CM

## 2019-06-04 DIAGNOSIS — R509 Fever, unspecified: Secondary | ICD-10-CM

## 2019-06-04 DIAGNOSIS — E1140 Type 2 diabetes mellitus with diabetic neuropathy, unspecified: Secondary | ICD-10-CM | POA: Diagnosis present

## 2019-06-04 LAB — URINALYSIS, COMPLETE (UACMP) WITH MICROSCOPIC
Bacteria, UA: NONE SEEN
Bilirubin Urine: NEGATIVE
Glucose, UA: >=500 mg/dL — AB
Hgb urine dipstick: NEGATIVE
Ketones, ur: NEGATIVE mg/dL
Leukocytes,Ua: NEGATIVE
Nitrite: NEGATIVE
Protein, ur: 100 mg/dL — AB
Specific Gravity, Urine: 1.014 (ref 1.005–1.030)
pH: 6 (ref 5.0–8.0)

## 2019-06-04 LAB — COMPREHENSIVE METABOLIC PANEL
ALT: 31 U/L (ref 0–44)
AST: 30 U/L (ref 15–41)
Albumin: 3.5 g/dL (ref 3.5–5.0)
Alkaline Phosphatase: 155 U/L — ABNORMAL HIGH (ref 38–126)
Anion gap: 12 (ref 5–15)
BUN: 16 mg/dL (ref 6–20)
CO2: 22 mmol/L (ref 22–32)
Calcium: 8.8 mg/dL — ABNORMAL LOW (ref 8.9–10.3)
Chloride: 100 mmol/L (ref 98–111)
Creatinine, Ser: 1.28 mg/dL — ABNORMAL HIGH (ref 0.61–1.24)
GFR calc Af Amer: >60 mL/min (ref >60)
GFR calc non Af Amer: >60 mL/min (ref >60)
Glucose, Bld: 271 mg/dL — ABNORMAL HIGH (ref 70–99)
Potassium: 4.4 mmol/L (ref 3.5–5.1)
Sodium: 134 mmol/L — ABNORMAL LOW (ref 135–145)
Total Bilirubin: 1.9 mg/dL — ABNORMAL HIGH (ref 0.3–1.2)
Total Protein: 7 g/dL (ref 6.5–8.1)

## 2019-06-04 LAB — CBC WITH DIFFERENTIAL/PLATELET
Abs Immature Granulocytes: 0.04 10*3/uL (ref 0.00–0.07)
Basophils Absolute: 0 10*3/uL (ref 0.0–0.1)
Basophils Relative: 0 %
Eosinophils Absolute: 0.2 10*3/uL (ref 0.0–0.5)
Eosinophils Relative: 1 %
HCT: 38 % — ABNORMAL LOW (ref 39.0–52.0)
Hemoglobin: 13.2 g/dL (ref 13.0–17.0)
Immature Granulocytes: 0 %
Lymphocytes Relative: 9 %
Lymphs Abs: 1.2 10*3/uL (ref 0.7–4.0)
MCH: 32.4 pg (ref 26.0–34.0)
MCHC: 34.7 g/dL (ref 30.0–36.0)
MCV: 93.4 fL (ref 80.0–100.0)
Monocytes Absolute: 0.6 10*3/uL (ref 0.1–1.0)
Monocytes Relative: 5 %
Neutro Abs: 11 10*3/uL — ABNORMAL HIGH (ref 1.7–7.7)
Neutrophils Relative %: 85 %
Platelets: 158 10*3/uL (ref 150–400)
RBC: 4.07 MIL/uL — ABNORMAL LOW (ref 4.22–5.81)
RDW: 16.3 % — ABNORMAL HIGH (ref 11.5–15.5)
WBC: 13.1 10*3/uL — ABNORMAL HIGH (ref 4.0–10.5)
nRBC: 0 % (ref 0.0–0.2)

## 2019-06-04 LAB — PROTIME-INR
INR: 1.1 (ref 0.8–1.2)
Prothrombin Time: 13.9 seconds (ref 11.4–15.2)

## 2019-06-04 LAB — AMMONIA: Ammonia: 24 umol/L (ref 9–35)

## 2019-06-04 LAB — PROCALCITONIN: Procalcitonin: 0.19 ng/mL

## 2019-06-04 LAB — GLUCOSE, CAPILLARY: Glucose-Capillary: 230 mg/dL — ABNORMAL HIGH (ref 70–99)

## 2019-06-04 LAB — LACTIC ACID, PLASMA
Lactic Acid, Venous: 2.7 mmol/L (ref 0.5–1.9)
Lactic Acid, Venous: 2.5 mmol/L (ref 0.5–1.9)

## 2019-06-04 LAB — BRAIN NATRIURETIC PEPTIDE: B Natriuretic Peptide: 219 pg/mL — ABNORMAL HIGH (ref 0.0–100.0)

## 2019-06-04 LAB — POC SARS CORONAVIRUS 2 AG: SARS Coronavirus 2 Ag: NEGATIVE

## 2019-06-04 MED ORDER — SODIUM CHLORIDE 0.9 % IV BOLUS (SEPSIS)
1000.0000 mL | Freq: Once | INTRAVENOUS | Status: AC
  Administered 2019-06-04: 18:00:00 1000 mL via INTRAVENOUS

## 2019-06-04 MED ORDER — LEVETIRACETAM 250 MG PO TABS
250.0000 mg | ORAL_TABLET | Freq: Three times a day (TID) | ORAL | Status: DC
  Administered 2019-06-05 – 2019-06-07 (×8): 250 mg via ORAL
  Filled 2019-06-04 (×10): qty 1

## 2019-06-04 MED ORDER — PANTOPRAZOLE SODIUM 40 MG PO TBEC
40.0000 mg | DELAYED_RELEASE_TABLET | Freq: Every day | ORAL | Status: DC
  Administered 2019-06-05 – 2019-06-06 (×2): 40 mg via ORAL
  Filled 2019-06-04 (×2): qty 1

## 2019-06-04 MED ORDER — ENOXAPARIN SODIUM 40 MG/0.4ML ~~LOC~~ SOLN
40.0000 mg | SUBCUTANEOUS | Status: DC
  Administered 2019-06-04 – 2019-06-06 (×3): 40 mg via SUBCUTANEOUS
  Filled 2019-06-04 (×5): qty 0.4

## 2019-06-04 MED ORDER — METOPROLOL SUCCINATE ER 100 MG PO TB24
100.0000 mg | ORAL_TABLET | Freq: Every day | ORAL | Status: DC
  Administered 2019-06-05 – 2019-06-07 (×3): 100 mg via ORAL
  Filled 2019-06-04 (×2): qty 2
  Filled 2019-06-04: qty 1

## 2019-06-04 MED ORDER — ACETAMINOPHEN 500 MG PO TABS
1000.0000 mg | ORAL_TABLET | Freq: Once | ORAL | Status: AC
  Administered 2019-06-04: 18:00:00 1000 mg via ORAL
  Filled 2019-06-04: qty 2

## 2019-06-04 MED ORDER — ATORVASTATIN CALCIUM 20 MG PO TABS
40.0000 mg | ORAL_TABLET | Freq: Every day | ORAL | Status: DC
  Administered 2019-06-05 – 2019-06-06 (×2): 40 mg via ORAL
  Filled 2019-06-04 (×2): qty 2

## 2019-06-04 MED ORDER — VENLAFAXINE HCL 37.5 MG PO TABS
75.0000 mg | ORAL_TABLET | Freq: Three times a day (TID) | ORAL | Status: DC
  Administered 2019-06-05 – 2019-06-07 (×7): 75 mg via ORAL
  Filled 2019-06-04 (×12): qty 2

## 2019-06-04 MED ORDER — PREGABALIN 50 MG PO CAPS
100.0000 mg | ORAL_CAPSULE | Freq: Three times a day (TID) | ORAL | Status: DC
  Administered 2019-06-04 – 2019-06-07 (×8): 100 mg via ORAL
  Filled 2019-06-04 (×8): qty 2

## 2019-06-04 MED ORDER — SODIUM CHLORIDE 0.9 % IV SOLN: INTRAVENOUS | Status: DC

## 2019-06-04 MED ORDER — VANCOMYCIN HCL IN DEXTROSE 1-5 GM/200ML-% IV SOLN
1000.0000 mg | Freq: Once | INTRAVENOUS | Status: AC
  Administered 2019-06-04: 20:00:00 1000 mg via INTRAVENOUS
  Filled 2019-06-04: qty 200

## 2019-06-04 MED ORDER — SODIUM CHLORIDE 0.9 % IV SOLN
2.0000 g | Freq: Once | INTRAVENOUS | Status: AC
  Administered 2019-06-04: 18:00:00 2 g via INTRAVENOUS
  Filled 2019-06-04: qty 2

## 2019-06-04 MED ORDER — SODIUM CHLORIDE 0.9 % IV SOLN
INTRAVENOUS | Status: DC
  Administered 2019-06-04: 23:00:00 via INTRAVENOUS

## 2019-06-04 MED ORDER — ASPIRIN 81 MG PO CHEW
81.0000 mg | CHEWABLE_TABLET | Freq: Every day | ORAL | Status: DC
  Administered 2019-06-05 – 2019-06-06 (×2): 81 mg via ORAL
  Filled 2019-06-04 (×2): qty 1

## 2019-06-04 MED ORDER — INSULIN ASPART 100 UNIT/ML ~~LOC~~ SOLN
0.0000 [IU] | Freq: Three times a day (TID) | SUBCUTANEOUS | Status: DC
  Administered 2019-06-05: 18:00:00 8 [IU] via SUBCUTANEOUS
  Administered 2019-06-05: 22:00:00 11 [IU] via SUBCUTANEOUS
  Administered 2019-06-05: 13:00:00 8 [IU] via SUBCUTANEOUS
  Administered 2019-06-05: 09:00:00 5 [IU] via SUBCUTANEOUS
  Administered 2019-06-06 (×3): 8 [IU] via SUBCUTANEOUS
  Administered 2019-06-07: 08:00:00 3 [IU] via SUBCUTANEOUS
  Administered 2019-06-07: 13:00:00 8 [IU] via SUBCUTANEOUS
  Filled 2019-06-04 (×9): qty 1

## 2019-06-04 MED ORDER — CLOPIDOGREL BISULFATE 75 MG PO TABS
75.0000 mg | ORAL_TABLET | Freq: Every day | ORAL | Status: DC
  Administered 2019-06-05 – 2019-06-07 (×3): 75 mg via ORAL
  Filled 2019-06-04 (×3): qty 1

## 2019-06-04 NOTE — Consult Note (Signed)
PHARMACY -  BRIEF ANTIBIOTIC NOTE   Pharmacy has received consult(s) for Cefepime/Vancomcyin from an ED provider.  The patient's profile has been reviewed for ht/wt/allergies/indication/available labs.    One time order(s) placed for Vancomycin 1g x1 dose and Cefepime 2g x1 dose.  Further antibiotics/pharmacy consults should be ordered by admitting physician if indicated.                       Thank you, Rowland Lathe 06/04/2019  5:56 PM

## 2019-06-04 NOTE — ED Triage Notes (Signed)
Patient presents to the ED via EMS from home.  EMS was called by patient's nephew who stated patient was more confused than normal.  Patient occasionally repeating himself with EMS, could not remember the president's name but knew he was, "rich".  Patient is febrile on arrival and incontinent of urine which patient states is not his baseline.

## 2019-06-04 NOTE — ED Notes (Signed)
Pt requesting [place] sandwich meal. Dr Joan Mayans notified and approved. Pt provided with meal and diet cola.

## 2019-06-04 NOTE — ED Provider Notes (Signed)
[facility] Emergency Department Provider Note  ____________________________________________   First MD Initiated Contact with Patient 06/04/19 1723     (approximate)  I have reviewed the triage vital signs and the nursing notes.  History  Chief Complaint Altered Mental Status    HPI Larry Terrell is a 58 y.o. male with history of HF, cirrhosis, COPD, DM, TIA who presents emergency department for fever and confusion.  EMS were called to the family's home due to concern for confusion and urinary incontinence.  Normally the patient is alert and oriented x 3.  With EMS he was reportedly febrile to 102, with associated tachycardia.  On arrival to the emergency department his temperature is 100.5 orally, HR 105.  Patient denies any acute complaints. On arrival he is AO x 3.     Past Medical Hx Past Medical History:  Diagnosis Date  . CHF (congestive heart failure) (HCC)    diastolic (echo at [facility] 3474) EF 55-60%  . Cirrhosis ([place])    NASH per records  . COPD (chronic obstructive pulmonary disease) ([place])   . Depression   . Diabetes mellitus with neuropathy ([place])   . Heart attack ([place])   . Neuropathy   . OSA (obstructive sleep apnea)   . Peripheral vascular disease due to secondary diabetes mellitus ([place])   . Splenomegaly   . Temporal giant cell arteritis ([place])   . TIA (transient ischemic attack) April 2016    Problem List Patient Active Problem List   Diagnosis Date Noted  . Stroke ([place]) 04/01/2019  . NSTEMI (non-ST elevated myocardial infarction) ([place]) 08/06/2017  . Acute appendicitis 07/10/2015  . S/P laparoscopic appendectomy 07/10/2015  . Appendicitis, acute   . Diabetes mellitus with complication ([place])   . Chronic congestive heart failure with left ventricular diastolic dysfunction ([place])   . Lactic acidosis   . Acute respiratory failure with hypoxia [facility])     Past Surgical Hx Past Surgical History:  Procedure Laterality Date  . AORTA  - FEMORAL ARTERY BYPASS GRAFT Bilateral   . CARDIAC CATHETERIZATION    . cardiac stents  July 2015   . CORONARY STENT INTERVENTION N/A 08/09/2017   Procedure: CORONARY STENT INTERVENTION;  Surgeon: Wellington Hampshire, MD;  Location: [place] CV LAB;  Service: Cardiovascular;  Laterality: N/A;  . LAPAROSCOPIC APPENDECTOMY N/A 07/10/2015   Procedure: APPENDECTOMY LAPAROSCOPIC;  Surgeon: Rolm Bookbinder, MD;  Location: [place];  Service: General;  Laterality: N/A;  . LEFT HEART CATH AND CORONARY ANGIOGRAPHY N/A 08/09/2017   Procedure: LEFT HEART CATH AND CORONARY ANGIOGRAPHY;  Surgeon: Wellington Hampshire, MD;  Location: [place] CV [facility];  Service: Cardiovascular;  Laterality: N/A;    Medications Prior to Admission medications   Medication Sig Start Date End Date Taking? Authorizing Provider  aspirin 81 MG chewable tablet Chew 1 tablet (81 mg total) by mouth daily. 08/11/17   Fritzi Mandes, MD  atorvastatin (LIPITOR) 40 MG tablet Take 40 mg by mouth daily.     [provider]  cloNIDine (CATAPRES) 0.1 MG tablet Take 1 tablet (0.1 mg total) by mouth 2 (two) times daily. 04/03/19 06/02/19  Henreitta Leber, MD  clopidogrel (PLAVIX) 75 MG tablet Take 1 tablet (75 mg total) by mouth daily with breakfast. 08/11/17   Fritzi Mandes, MD  famotidine (PEPCID) 20 MG tablet Take 1 tablet (20 mg total) by mouth 2 (two) times daily. 12/26/17   Paulette Blanch, MD  insulin NPH-regular Human (NOVOLIN 70/30) (70-30) 100  UNIT/ML injection Inject 20-24 Units into the skin 2 (two) times daily. Take 24units in the morning and 20units in the evening.    [provider]  levETIRAcetam (KEPPRA) 250 MG tablet Take 250 mg by mouth 3 (three) times daily. 03/19/19   [provider]  metFORMIN (GLUCOPHAGE) 500 MG tablet Take 500 mg by mouth 2 (two) times daily with a meal.    [provider]  metoprolol (LOPRESSOR) 50 MG tablet Take 50 mg by mouth 2 (two) times daily.    [provider]   nitroGLYCERIN (NITROSTAT) 0.4 MG SL tablet Place 0.4 mg under the tongue every 5 (five) minutes as needed for chest pain.    [provider]  pregabalin (LYRICA) 50 MG capsule Take 1 capsule (50 mg total) by mouth 3 (three) times daily. 03/22/19 03/21/20  Earleen Newport, MD  tamsulosin (FLOMAX) 0.4 MG CAPS capsule Take 0.4 mg by mouth daily. 10/24/18   [provider]  venlafaxine XR (EFFEXOR-XR) 75 MG 24 hr capsule Take 225 mg by mouth daily. 12/31/18   [provider]    Allergies Gabapentin and Ace inhibitors  Family Hx Family History  Problem Relation Age of Onset  . Heart attack Mother 41  . Stroke Mother   . Heart attack Father   . Alzheimer's disease Father     Social Hx Social History   Tobacco Use  . Smoking status: Former Smoker    Types: Cigarettes    Quit date: 2009    Years since quitting: 11.8  . Smokeless tobacco: Never Used  Substance Use Topics  . Alcohol use: No    Alcohol/week: 0.0 standard drinks  . Drug use: No     Review of Systems  Constitutional: + fever Eyes: Negative for visual changes. ENT: Negative for sore throat. Cardiovascular: Negative for chest pain. Respiratory: Negative for shortness of breath. Gastrointestinal: Negative for nausea, vomiting.  Genitourinary: Negative for dysuria. Musculoskeletal: Negative for leg swelling. Skin: Negative for rash. Neurological: + confusion   Physical Exam  Vital Signs: ED Triage Vitals [06/04/19 1727]  Enc Vitals Group     BP (!) 192/72     Pulse Rate (!) 105     Resp 20     Temp (!) 100.5 F (38.1 C)     Temp Source Oral     SpO2 (!) 85 %     Weight      Height      Head Circumference      Peak Flow      Pain Score      Pain Loc      Pain Edu?      Excl. in [place]?     Constitutional: Alert and oriented to person, age, place, year. Head: Normocephalic. Atraumatic. Eyes: Conjunctivae clear. Sclera anicteric. Nose: No congestion. No rhinorrhea.  Mouth/Throat: Wearing mask. MM dry.  Neck: No stridor.   Cardiovascular: Tachycardic. Extremities well perfused. Respiratory: Mild tachypnea, RR low to mid 20s, speaking in full sentences. On 2 L . Lungs CTAB. Gastrointestinal: Soft. Non-tender. Non-distended.  Musculoskeletal: No lower extremity edema. No deformities. Neurologic:  Normal speech and language. No gross focal neurologic deficits are appreciated.  Skin: Skin is warm, dry and intact. No rash noted. Psychiatric: Mood and affect are appropriate for situation.  EKG  Personally reviewed.   Rate: 104 Rhythm: sinus Axis: normal Intervals: WNL No STEMI    Radiology  XR:  IMPRESSION:  Cardiomegaly with diffuse bilateral interstitial opacities. Findings  may represent pulmonary edema versus atypical/viral infection.    Procedures  Procedure(s) performed (including critical care):  .Critical Care Performed by: Lilia Pro., MD Authorized by: Lilia Pro., MD   Critical care provider statement:    Critical care time (minutes):  45   Critical care was necessary to treat or prevent imminent or life-threatening deterioration of the following conditions:  Sepsis   Critical care was time spent personally by me on the following activities:  Discussions with consultants, evaluation of patient's response to treatment, examination of patient, ordering and performing treatments and interventions, ordering and review of laboratory studies, ordering and review of radiographic studies, pulse oximetry, re-evaluation of patient's condition, obtaining history from patient or surrogate and review of old charts     Initial Impression / Assessment and Plan / ED Course  58 y.o. male who presents to the ED who presents for confusion, fever. On arrival is AO x 3.  Meets sepsis criteria on arrival. Unclear etiology, but perhaps urine given new incontinence, also consider pulmonary given mild tachypnea, needing 2 L [place]. Will  obtain labs, cultures, urine, give empiric antibiotics, fluids.   Urine negative for infection.  Leukocytosis to 13.  Creatinine 1.28, consistent with prior.  Lactic acid 2.7, COVID antigen negative, will obtain PCR swab given concern remains on differential with [place] requirement and XR findings.  Will admit. Discussed with hospitalist for admission.    Final Clinical Impression(s) / ED Diagnosis  Final diagnoses:  Fever in adult  Confusion  SIRS (systemic inflammatory response syndrome) ([place])       Note:  This document was prepared using Dragon voice recognition software and may include unintentional dictation errors.   Lilia Pro., MD 06/04/19 2022

## 2019-06-04 NOTE — H&P (Signed)
History and Physical    Lyndall Windt AVW:[id number] DOB: [birth year] DOA: 06/04/2019  PCP: [facility], [place]  Patient coming from: Home, lives with his cousin and brother. No family at bedside today.  I have personally briefly reviewed patient's old medical records in [place]  Chief Complaint: confusion and urinary incontinence  HPI: Coal Devlin Brink is a 58 y.o. male with medical history significant of  CVA, hx of seizures, CAD s/p NSTEMI, Chronic systolic heart failure, OSA not on CPAP, Type 2 diabetes who presents with concerns of increase confusion. He is unable to recall much of his history. Reportedly cousin who he lives with at home noticed him acting more strange this morning and putting things that don't normally belong into the refrigerator. He was documented to be alert and orient x3 in the ED but had an episode of urinary incontinence which is unusual for him. He report some mild shortness of breath but no new cough or runny nose.  Denies any nausea, vomiting or diarrhea.  Denies any dysuria, increased frequency and urgency.  Denies any sick contact.   ED Course:  He was febrile up to 100.5 on arrival with mild tachycardia up to 105 and elevated blood pressure up to 190s over 70s.  He also had oxygen desaturation down to 85% requiring 2 L via nasal cannula.   CBC shows leukocytosis of 13.1, stable hemoglobin of 13.2.  CMP showed sodium of 134, glucose of 271, creatinine of 1.28 from a prior of 1.202 months ago.  Alkaline phosphatase of 155, total bilirubin of 1.9.  Ammonia level 24.  Lactic acid of 2.7.  Urinalysis was negative. Chest x-ray shows cardiomegaly with diffuse bilateral interstitial opacity which may represent pulmonary edema versus atypical/viral infection.  Procalcitonin of 0.19.   Review of Systems:  Unable to obtain a full review of systems as patient not able to recall much of his symptoms  Past Medical History:  Diagnosis Date   CHF  (congestive heart failure) (HCC)    diastolic (echo at [facility] 8295) EF 55-60%   Cirrhosis ([place])    NASH per records   COPD (chronic obstructive pulmonary disease) ([place])    Depression    Diabetes mellitus with neuropathy ([place])    Heart attack ([place])    Neuropathy    OSA (obstructive sleep apnea)    Peripheral vascular disease due to secondary diabetes mellitus ([place])    Splenomegaly    Temporal giant cell arteritis ([place])    TIA (transient ischemic attack) April 2016    Past Surgical History:  Procedure Laterality Date   AORTA - FEMORAL ARTERY BYPASS GRAFT Bilateral    CARDIAC CATHETERIZATION     cardiac stents  July 2015    CORONARY STENT INTERVENTION N/A 08/09/2017   Procedure: CORONARY STENT INTERVENTION;  Surgeon: Wellington Hampshire, MD;  Location: [place] [facility];  Service: Cardiovascular;  Laterality: N/A;   LAPAROSCOPIC APPENDECTOMY N/A 07/10/2015   Procedure: APPENDECTOMY LAPAROSCOPIC;  Surgeon: Rolm Bookbinder, MD;  Location: [place];  Service: General;  Laterality: N/A;   LEFT HEART CATH AND CORONARY ANGIOGRAPHY N/A 08/09/2017   Procedure: LEFT HEART CATH AND CORONARY ANGIOGRAPHY;  Surgeon: Wellington Hampshire, MD;  Location: [place] [facility];  Service: Cardiovascular;  Laterality: N/A;     reports that he quit smoking about 11 years ago. His smoking use included cigarettes. He has never used smokeless tobacco. He reports that he does not drink alcohol or use drugs.  Allergies  Allergen Reactions   Gabapentin    Ace Inhibitors Cough    Other reaction(s): Other (See Comments) cough    Family History  Problem Relation Age of Onset   Heart attack Mother 14   Stroke Mother    Heart attack Father    Alzheimer's disease Father      Prior to Admission medications   Medication Sig Start Date End Date Taking? Authorizing Provider  aspirin 81 MG chewable tablet Chew 1 tablet (81 mg total) by mouth daily. 08/11/17  Yes Fritzi Mandes, MD  atorvastatin  (LIPITOR) 40 MG tablet Take 40 mg by mouth daily.    Yes [provider]  clopidogrel (PLAVIX) 75 MG tablet Take 1 tablet (75 mg total) by mouth daily with breakfast. 08/11/17  Yes Fritzi Mandes, MD  levETIRAcetam (KEPPRA) 250 MG tablet Take 250 mg by mouth 3 (three) times daily. 03/19/19  Yes [provider]  losartan (COZAAR) 100 MG tablet Take 100 mg by mouth daily. 05/11/19  Yes [provider]  metFORMIN (GLUCOPHAGE) 500 MG tablet Take 500 mg by mouth 2 (two) times daily with a meal.   Yes [provider]  metoprolol succinate (TOPROL-XL) 100 MG 24 hr tablet Take 100 mg by mouth daily. 05/11/19  Yes [provider]  nitroGLYCERIN (NITROSTAT) 0.4 MG SL tablet Place 0.4 mg under the tongue every 5 (five) minutes as needed for chest pain.   Yes [provider]  omeprazole (PRILOSEC) 20 MG capsule Take 20 mg by mouth daily as needed for heartburn. 05/11/19  Yes [provider]  oxyCODONE (OXY IR/ROXICODONE) 5 MG immediate release tablet Take 5 mg by mouth every 8 (eight) hours as needed for pain. 05/18/19  Yes [provider]  pregabalin (LYRICA) 100 MG capsule Take 100 mg by mouth 3 (three) times daily. 05/11/19  Yes [provider]  pregabalin (LYRICA) 50 MG capsule Take 1 capsule (50 mg total) by mouth 3 (three) times daily. 03/22/19 03/21/20 Yes Earleen Newport, MD  venlafaxine (EFFEXOR) 75 MG tablet Take 75 mg by mouth 3 (three) times daily. 05/11/19  Yes [provider]  famotidine (PEPCID) 20 MG tablet Take 1 tablet (20 mg total) by mouth 2 (two) times daily. Patient not taking: Reported on 06/04/2019 12/26/17   Paulette Blanch, MD    Physical Exam: Vitals:   06/04/19 1915 06/04/19 1930 06/04/19 2000 06/04/19 2007  BP:  (!) 172/87 (!) 155/69   Pulse: 100 96 88   Resp: (!) 21 (!) 27 20   Temp:    98.2 F (36.8 C)  TempSrc:    Oral  SpO2: 96% 100% 97%   Weight:      Height:        Constitutional:  Nontoxic, disheveled appearing male laying flat in bed Vitals:   06/04/19 1915 06/04/19 1930 06/04/19 2000 06/04/19 2007  BP:  (!) 172/87 (!) 155/69   Pulse: 100 96 88   Resp: (!) 21 (!) 27 20   Temp:    98.2 F (36.8 C)  TempSrc:    Oral  SpO2: 96% 100% 97%   Weight:      Height:       Eyes: PERRL, lids and conjunctivae normal ENMT: Mucous membranes are moist. Posterior pharynx clear of any exudate or lesions.several missing anterior dentition Neck: normal, supple, no masses. Respiratory: clear to auscultation bilaterally, no wheezing, no crackles. Normal respiratory effort on 2 L via nasal cannula with 98% oxygen saturation. No accessory  muscle use.  Cardiovascular: Regular rate and rhythm, no murmurs / rubs / gallops. No extremity edema. 2+ pedal pulses.   Abdomen: no tenderness, no masses palpated.  Bowel sounds positive.  Musculoskeletal: no clubbing / cyanosis. No joint deformity upper and lower extremities. Good ROM, no contractures. Normal muscle tone.  Skin: Superficial healing abrasion of the left knee with mild erythema. Neurologic: CN 2-12 grossly intact. Sensation intact. Strength 5/5 in all 4.  Psychiatric: Normal judgment and insight. Alert and oriented x 3. Normal mood.     Labs on Admission: I have personally reviewed following labs and imaging studies  CBC: Recent Labs  Lab 06/04/19 1741  WBC 13.1*  NEUTROABS 11.0*  HGB 13.2  HCT 38.0*  MCV 93.4  PLT 224   Basic Metabolic Panel: Recent Labs  Lab 06/04/19 1741  NA 134*  K 4.4  CL 100  CO2 22  GLUCOSE 271*  BUN 16  CREATININE 1.28*  CALCIUM 8.8*   GFR: Estimated Creatinine Clearance: 71.9 mL/min (A) (by C-G formula based on SCr of 1.28 mg/dL (H)). Liver Function Tests: Recent Labs  Lab 06/04/19 1741  AST 30  ALT 31  ALKPHOS 155*  BILITOT 1.9*  PROT 7.0  ALBUMIN 3.5   No results for input(s): LIPASE, AMYLASE in the last 168 hours. Recent Labs  Lab 06/04/19 1952  AMMONIA 24    Coagulation Profile: Recent Labs  Lab 06/04/19 1741  INR 1.1   Cardiac Enzymes: No results for input(s): CKTOTAL, CKMB, CKMBINDEX, TROPONINI in the last 168 hours. BNP (last 3 results) No results for input(s): PROBNP in the last 8760 hours. HbA1C: No results for input(s): HGBA1C in the last 72 hours. CBG: Recent Labs  Lab 06/04/19 1930  GLUCAP 230*   Lipid Profile: No results for input(s): CHOL, HDL, LDLCALC, TRIG, CHOLHDL, LDLDIRECT in the last 72 hours. Thyroid Function Tests: No results for input(s): TSH, T4TOTAL, FREET4, T3FREE, THYROIDAB in the last 72 hours. Anemia Panel: No results for input(s): VITAMINB12, FOLATE, FERRITIN, TIBC, IRON, RETICCTPCT in the last 72 hours. Urine analysis:    Component Value Date/Time   COLORURINE YELLOW (A) 06/04/2019 1741   APPEARANCEUR CLEAR (A) 06/04/2019 1741   APPEARANCEUR Clear 11/06/2014 1527   LABSPEC 1.014 06/04/2019 1741   LABSPEC 1.008 11/06/2014 1527   PHURINE 6.0 06/04/2019 1741   GLUCOSEU >=500 (A) 06/04/2019 1741   GLUCOSEU Negative 11/06/2014 1527   HGBUR NEGATIVE 06/04/2019 1741   BILIRUBINUR NEGATIVE 06/04/2019 1741   BILIRUBINUR Negative 11/06/2014 [place] 06/04/2019 1741   PROTEINUR 100 (A) 06/04/2019 1741   NITRITE NEGATIVE 06/04/2019 1741   LEUKOCYTESUR NEGATIVE 06/04/2019 1741   LEUKOCYTESUR Negative 11/06/2014 1527    Radiological Exams on Admission: Dg Chest Port 1 View  Result Date: 06/04/2019 CLINICAL DATA:  Fever EXAM: PORTABLE CHEST 1 VIEW COMPARISON:  04/17/2019 FINDINGS: Stable cardiomegaly. Diffuse bilateral interstitial opacities throughout both lungs. There is a background of chronic interstitial lung disease, as seen on priors. No large pleural fluid collection. No pneumothorax. IMPRESSION: Cardiomegaly with diffuse bilateral interstitial opacities. Findings may represent pulmonary edema versus atypical/viral infection. Electronically Signed   By: Davina Poke M.D.   On:  06/04/2019 18:01    EKG: Independently reviewed.   Assessment/Plan  Sepsis and hypoxia secondary to possible viral pneumonia -Chest x-ray shows pulmonary edema versus viral/atypical pneumonia.  PCT is low at 0.19. BNP in 200s. -IV vancomycin and cefepime was given in the ED.  Will discontinue as hypoxia likely a  viral cause. - respiratory viral panel  -Covid test is pending.  Will admit as PUI given higher suspicion of a potential positive test with bilateral infiltrate seen on chest x-ray. -continue to trend lactate - Received 1L NS bolus in ED. Continue gentle fluids for 8 hrs given mild CHF  AKI -Creatinine 1.25 on admission -Follow creatinine following fluid resuscitation -Avoid nephrotoxic agent  Elevated bilirubin -Bilirubin of 1.9 admission but asymptomatic abdominal exam -Possibly due to vascular congestion - will monitor with CMP in the morning  History of CAD -Continue aspirin  History of CVA -continue aspirin and Plavix -Continue statin  Type 2 diabetes with neuropathy -continue Lyrica - Moderate SSI  Chronic systolic heart failure -Continue metoprolol  Hypertension -Hold losartan due to AKI  Hx of seizure -continue Keppra  GERD -continue PPI  OSA -Not on CPAP   DVT prophylaxis:.Lovenox Code Status:Full Family Communication: Plan discussed with patient at bedside  disposition Plan: Home with at least 2 midnight stays  Consults called:  Admission status: inpatient as [place] T Malik Ruffino DO [facility]   If 7PM-7AM, please contact night-coverage [email or web address] Password [facility]  06/04/2019, 9:50 PM

## 2019-06-04 NOTE — ED Notes (Signed)
CBG checked by Myra NT with result of 230. Analyzer not transferring results. Dr Joan Mayans notified.

## 2019-06-04 NOTE — ED Notes (Signed)
Resumed care from tom rn.  Pt alert and watching tv.

## 2019-06-04 NOTE — Progress Notes (Signed)
CODE SEPSIS - PHARMACY COMMUNICATION  **Broad Spectrum Antibiotics should be administered within 1 hour of Sepsis diagnosis**  Time Code Sepsis Called/Page Received: @5 :43 pm   Antibiotics Ordered: Cefepime and Vancomycin   Time of 1st antibiotic administration: Cefepime @ [place] Zaivion Kundrat ,PharmD Clinical Pharmacist  06/04/2019  5:56 PM

## 2019-06-05 LAB — URINE CULTURE: Culture: NO GROWTH

## 2019-06-05 LAB — RESPIRATORY PANEL BY PCR

## 2019-06-05 LAB — CBC
HCT: 36.1 % — ABNORMAL LOW (ref 39.0–52.0)
Hemoglobin: 12.5 g/dL — ABNORMAL LOW (ref 13.0–17.0)
MCH: 32.6 pg (ref 26.0–34.0)
MCHC: 34.6 g/dL (ref 30.0–36.0)
MCV: 94.3 fL (ref 80.0–100.0)
Platelets: 136 10*3/uL — ABNORMAL LOW (ref 150–400)
RBC: 3.83 MIL/uL — ABNORMAL LOW (ref 4.22–5.81)
RDW: 16.6 % — ABNORMAL HIGH (ref 11.5–15.5)
WBC: 7.4 10*3/uL (ref 4.0–10.5)
nRBC: 0 % (ref 0.0–0.2)

## 2019-06-05 LAB — GLUCOSE, CAPILLARY
Glucose-Capillary: 244 mg/dL — ABNORMAL HIGH (ref 70–99)
Glucose-Capillary: 289 mg/dL — ABNORMAL HIGH (ref 70–99)
Glucose-Capillary: 256 mg/dL — ABNORMAL HIGH (ref 70–99)
Glucose-Capillary: 342 mg/dL — ABNORMAL HIGH (ref 70–99)

## 2019-06-05 LAB — BASIC METABOLIC PANEL
Anion gap: 7 (ref 5–15)
BUN: 17 mg/dL (ref 6–20)
CO2: 22 mmol/L (ref 22–32)
Calcium: 8.3 mg/dL — ABNORMAL LOW (ref 8.9–10.3)
Chloride: 107 mmol/L (ref 98–111)
Creatinine, Ser: 1.22 mg/dL (ref 0.61–1.24)
GFR calc Af Amer: >60 mL/min (ref >60)
GFR calc non Af Amer: >60 mL/min (ref >60)
Glucose, Bld: 306 mg/dL — ABNORMAL HIGH (ref 70–99)
Potassium: 4.3 mmol/L (ref 3.5–5.1)
Sodium: 136 mmol/L (ref 135–145)

## 2019-06-05 LAB — HEMOGLOBIN A1C
Hgb A1c MFr Bld: 6.9 % — ABNORMAL HIGH (ref 4.8–5.6)
Mean Plasma Glucose: 151.33 mg/dL

## 2019-06-05 LAB — SARS CORONAVIRUS 2 (TAT 6-24 HRS): SARS Coronavirus 2: NEGATIVE

## 2019-06-05 MED ORDER — FUROSEMIDE 10 MG/ML IJ SOLN
40.0000 mg | Freq: Two times a day (BID) | INTRAMUSCULAR | Status: DC
  Administered 2019-06-05 – 2019-06-07 (×5): 40 mg via INTRAVENOUS
  Filled 2019-06-05 (×5): qty 4

## 2019-06-05 MED ORDER — LOSARTAN POTASSIUM 50 MG PO TABS
100.0000 mg | ORAL_TABLET | Freq: Every day | ORAL | Status: DC
  Administered 2019-06-05 – 2019-06-07 (×3): 100 mg via ORAL
  Filled 2019-06-05 (×3): qty 2

## 2019-06-05 NOTE — ED Notes (Signed)
Pt sleeping. 

## 2019-06-05 NOTE — ED Notes (Signed)
Report off to dawn rn

## 2019-06-05 NOTE — ED Notes (Signed)
1100 mL of urine emptied from urinal. Valerie,RN made aware.

## 2019-06-05 NOTE — ED Notes (Signed)
Provided pt with lunch tray

## 2019-06-05 NOTE — ED Notes (Signed)
Put new sheet on pt's bed and new brief on pt

## 2019-06-05 NOTE — Progress Notes (Signed)
[facility] Progress Note  Patient: Larry Terrell WUX:[id number]   PCP: [facility], [place] DOB: 07-Apr-1961   DOA: 06/04/2019   DOS: 06/05/2019   Date of Service: the patient was seen and examined on 06/05/2019  Chief Complaint  Patient presents with  . Altered Mental Status   Brief hospital course: Larry Terrell is a 58 y.o. male with medical history significant of  CVA, hx of seizures, CAD s/p NSTEMI, Chronic systolic heart failure, OSA not on CPAP, Type 2 diabetes who presents with concerns of increase confusion. He is unable to recall much of his history. Reportedly cousin who he lives with at home noticed him acting more strange this morning and putting things that don't normally belong into the refrigerator. He was documented to be alert and orient x3 in the ED but had an episode of urinary incontinence which is unusual for him. He report some mild shortness of breath but no new cough or runny nose. Denies any nausea, vomiting or diarrhea.  Denies any dysuria, increased frequency and urgency.  Denies any sick contact.  Currently further plan is continue treat respiratory illness.  Subjective: still has shortness of breath and cough, no more fever, no more chest pain.   Assessment and Plan: Scheduled Meds: . aspirin  81 mg Oral Daily  . atorvastatin  40 mg Oral Daily  . clopidogrel  75 mg Oral Q breakfast  . enoxaparin (LOVENOX) injection  40 mg Subcutaneous Q24H  . furosemide  40 mg Intravenous BID  . insulin aspart  0-15 Units Subcutaneous TID WC  . levETIRAcetam  250 mg Oral TID  . losartan  100 mg Oral Daily  . metoprolol succinate  100 mg Oral Daily  . pantoprazole  40 mg Oral Daily  . pregabalin  100 mg Oral TID  . venlafaxine  75 mg Oral TID   Continuous Infusions: PRN Meds:   Sepsis and hypoxia secondary to possible viral pneumonia HFPEF -Chest x-ray shows pulmonary edema versus viral/atypical pneumonia.  PCT is low at 0.19. BNP in 200s. -IV vancomycin  and cefepime was given in the ED.  Will discontinue as hypoxia likely a viral cause. - respiratory viral panel negative -Covid test is negative so far but the patient actually has a very high suspicion and possibility of a COVID-19 pneumonia therefore we will perform the send out [facility] test for COVID-19 and keep the patient as a PUI. - Received 1L NS bolus in ED.  Will give lasix  AKI -Creatinine 1.25 on admission -Follow creatinine following fluid resuscitation -Avoid nephrotoxic agent  Elevated bilirubin -Bilirubin of 1.9 admission but asymptomatic abdominal exam -Possibly due to vascular congestion - will monitor with CMP in the morning  History of CAD -Continue aspirin  History of CVA -continue aspirin and Plavix -Continue statin  Type 2 diabetes with neuropathy -continue Lyrica - Moderate SSI  Chronic systolic heart failure -Continue metoprolol  Hypertension -Hold losartan due to AKI  Hx of seizure -continue Keppra  GERD -continue PPI  OSA -Not on CPAP  Diet: Cardiac diet and Carb modified diet  DVT Prophylaxis: Subcutaneous Lovenox   Advance goals of care discussion: Full code  Family Communication: no family was present at bedside, at the time of interview.   Disposition:  Discharge to be determined.  Consultants: none Procedures: none  Antibiotics: Anti-infectives (From admission, onward)   Start     Dose/Rate Route Frequency Ordered Stop   06/04/19 1800  vancomycin (VANCOCIN) IVPB 1000 mg/200 mL  premix     1,000 mg 200 mL/hr over 60 Minutes Intravenous  Once 06/04/19 1753 06/04/19 2106   06/04/19 1745  ceFEPIme (MAXIPIME) 2 g in sodium chloride 0.9 % 100 mL IVPB     2 g 200 mL/hr over 30 Minutes Intravenous  Once 06/04/19 1743 06/04/19 1837       Objective: Physical Exam: Vitals:   06/05/19 1500 06/05/19 1600 06/05/19 1700 06/05/19 1835  BP: (!) 157/81 139/63 (!) 157/97   Pulse: 72 73 70   Resp: 15 15 14    Temp:    97.8 F  (36.6 C)  TempSrc:    Oral  SpO2: 98% 97% 98%   Weight:      Height:        Intake/Output Summary (Last 24 hours) at 06/05/2019 1944 Last data filed at 06/05/2019 1901 Gross per 24 hour  Intake 200 ml  Output 2850 ml  Net -2650 ml   Filed Weights   06/04/19 1753  Weight: 104.3 kg   General: alert and oriented to time, place, and person. Appear in moderate distress, affect appropriate Eyes: PERRL, Conjunctiva normal ENT: Oral Mucosa Clear, moist  Neck: no JVD, no Abnormal Mass Or lumps Cardiovascular: S1 and S2 Present, no Murmur,  Respiratory: increased respiratory effort, Bilateral Air entry equal and Decreased, no signs of accessory muscle use, bilateral  Crackles, no wheezes Abdomen: Bowel Sound present, Soft and no tenderness, no hernia Skin: no rashes  Extremities: no Pedal edema, no calf tenderness Neurologic: without any new focal findings Gait not checked due to patient safety concerns  Data Reviewed: I have personally reviewed and interpreted daily labs, tele strips, imagings as discussed above. I reviewed all nursing notes, pharmacy notes, vitals, pertinent old records I have discussed plan of care as described above with RN and patient/family.  CBC: Recent Labs  Lab 06/04/19 1741 06/05/19 0452  WBC 13.1* 7.4  NEUTROABS 11.0*  --   HGB 13.2 12.5*  HCT 38.0* 36.1*  MCV 93.4 94.3  PLT 158 767*   Basic Metabolic Panel: Recent Labs  Lab 06/04/19 1741 06/05/19 0452  NA 134* 136  K 4.4 4.3  CL 100 107  CO2 22 22  GLUCOSE 271* 306*  BUN 16 17  CREATININE 1.28* 1.22  CALCIUM 8.8* 8.3*    Liver Function Tests: Recent Labs  Lab 06/04/19 1741  AST 30  ALT 31  ALKPHOS 155*  BILITOT 1.9*  PROT 7.0  ALBUMIN 3.5   No results for input(s): LIPASE, AMYLASE in the last 168 hours. Recent Labs  Lab 06/04/19 1952  AMMONIA 24   Coagulation Profile: Recent Labs  Lab 06/04/19 1741  INR 1.1   Cardiac Enzymes: No results for input(s): CKTOTAL,  CKMB, CKMBINDEX, TROPONINI in the last 168 hours. BNP (last 3 results) No results for input(s): PROBNP in the last 8760 hours. CBG: Recent Labs  Lab 06/04/19 1930 06/05/19 0823 06/05/19 1240 06/05/19 1729  GLUCAP 230* 244* 289* 256*   Studies: No results found.   Time spent: 35 minutes  Author: Berle Mull, MD [facility] 06/05/2019 7:44 PM  To reach On-call, see care teams to locate the attending and reach out to them via [email or web address]. If 7PM-7AM, please contact night-coverage If you still have difficulty reaching the attending provider, please page the [facility] (Director on Call) for [facility] on amion for assistance.

## 2019-06-05 NOTE — ED Notes (Signed)
Provided pt w/ dinner tray

## 2019-06-05 NOTE — Progress Notes (Signed)
PHARMACY - PHYSICIAN COMMUNICATION CRITICAL VALUE ALERT - BLOOD CULTURE IDENTIFICATION (BCID)  Larry Terrell is an 58 y.o. male who presented to [facility] on 06/04/2019 with a chief complaint of confusion and urinary incontinence  Assessment:  1/4 bottles with GPC (per micro lab waiting until 2nd set turns positive before running BCID)  Name of physician (or Provider) Contacted: Dr Berle Mull  Current antibiotics: None  Changes to prescribed antibiotics recommended:  No changes at this time  No results found for this or any previous visit.  [name] 06/05/2019  2:06 PM

## 2019-06-05 NOTE — ED Notes (Signed)
Pt calling out, he unhooked his monitor to use the bathroom.

## 2019-06-05 NOTE — ED Notes (Signed)
Moved to hospital bed, pt states he is much more comfortable

## 2019-06-05 NOTE — ED Notes (Signed)
Pt sleeping, remains to have unlabored even respirations.  Waiting on admit bed. NAD

## 2019-06-05 NOTE — ED Notes (Signed)
Pt sleeping.  nsr on monitor.

## 2019-06-05 NOTE — ED Notes (Signed)
Sheets changed. Brief changed. Pt given meal tray. NAD, unlabored. Remains on 4 L [place] at this time.  Urinal emptied.  No other needs.  Pt has cell phone and table next to bed.

## 2019-06-06 ENCOUNTER — Inpatient Hospital Stay: Payer: Medicare Other

## 2019-06-06 LAB — GLUCOSE, CAPILLARY
Glucose-Capillary: 274 mg/dL — ABNORMAL HIGH (ref 70–99)
Glucose-Capillary: 290 mg/dL — ABNORMAL HIGH (ref 70–99)
Glucose-Capillary: 273 mg/dL — ABNORMAL HIGH (ref 70–99)
Glucose-Capillary: 250 mg/dL — ABNORMAL HIGH (ref 70–99)

## 2019-06-06 LAB — BASIC METABOLIC PANEL
Anion gap: 12 (ref 5–15)
BUN: 18 mg/dL (ref 6–20)
CO2: 21 mmol/L — ABNORMAL LOW (ref 22–32)
Calcium: 7.9 mg/dL — ABNORMAL LOW (ref 8.9–10.3)
Chloride: 104 mmol/L (ref 98–111)
Creatinine, Ser: 1.14 mg/dL (ref 0.61–1.24)
GFR calc Af Amer: >60 mL/min (ref >60)
GFR calc non Af Amer: >60 mL/min (ref >60)
Glucose, Bld: 295 mg/dL — ABNORMAL HIGH (ref 70–99)
Potassium: 3.6 mmol/L (ref 3.5–5.1)
Sodium: 137 mmol/L (ref 135–145)

## 2019-06-06 LAB — CBC WITH DIFFERENTIAL/PLATELET
Abs Immature Granulocytes: 0.01 10*3/uL (ref 0.00–0.07)
Basophils Absolute: 0 10*3/uL (ref 0.0–0.1)
Basophils Relative: 1 %
Eosinophils Absolute: 0.5 10*3/uL (ref 0.0–0.5)
Eosinophils Relative: 7 %
HCT: 37.3 % — ABNORMAL LOW (ref 39.0–52.0)
Hemoglobin: 12.7 g/dL — ABNORMAL LOW (ref 13.0–17.0)
Immature Granulocytes: 0 %
Lymphocytes Relative: 16 %
Lymphs Abs: 1.1 10*3/uL (ref 0.7–4.0)
MCH: 32.9 pg (ref 26.0–34.0)
MCHC: 34 g/dL (ref 30.0–36.0)
MCV: 96.6 fL (ref 80.0–100.0)
Monocytes Absolute: 0.4 10*3/uL (ref 0.1–1.0)
Monocytes Relative: 6 %
Neutro Abs: 4.6 10*3/uL (ref 1.7–7.7)
Neutrophils Relative %: 70 %
Platelets: 136 10*3/uL — ABNORMAL LOW (ref 150–400)
RBC: 3.86 MIL/uL — ABNORMAL LOW (ref 4.22–5.81)
RDW: 16.7 % — ABNORMAL HIGH (ref 11.5–15.5)
WBC: 6.6 10*3/uL (ref 4.0–10.5)
nRBC: 0 % (ref 0.0–0.2)

## 2019-06-06 LAB — MAGNESIUM: Magnesium: 1.7 mg/dL (ref 1.7–2.4)

## 2019-06-06 LAB — CULTURE, BLOOD (ROUTINE X 2)

## 2019-06-06 LAB — NOVEL CORONAVIRUS, NAA (HOSP ORDER, SEND-OUT TO REF LAB; TAT 18-24 HRS): SARS-CoV-2, NAA: NOT DETECTED

## 2019-06-06 MED ORDER — ACETAMINOPHEN 325 MG PO TABS
650.0000 mg | ORAL_TABLET | Freq: Once | ORAL | Status: AC
  Administered 2019-06-06: 08:00:00 650 mg via ORAL
  Filled 2019-06-06: qty 2

## 2019-06-06 MED ORDER — INSULIN DETEMIR 100 UNIT/ML ~~LOC~~ SOLN
10.0000 [IU] | Freq: Two times a day (BID) | SUBCUTANEOUS | Status: DC
  Administered 2019-06-06 – 2019-06-07 (×3): 10 [IU] via SUBCUTANEOUS
  Filled 2019-06-06 (×5): qty 0.1

## 2019-06-06 MED ORDER — OXYCODONE HCL 5 MG PO TABS
5.0000 mg | ORAL_TABLET | Freq: Three times a day (TID) | ORAL | Status: DC | PRN
  Administered 2019-06-06 – 2019-06-07 (×2): 5 mg via ORAL
  Filled 2019-06-06 (×2): qty 1

## 2019-06-06 MED ORDER — BISMUTH SUBSALICYLATE 262 MG/15ML PO SUSP
30.0000 mL | Freq: Once | ORAL | Status: AC
  Administered 2019-06-06: 10:00:00 30 mL via ORAL
  Filled 2019-06-06: qty 118

## 2019-06-06 MED ORDER — ASPIRIN EC 81 MG PO TBEC
81.0000 mg | DELAYED_RELEASE_TABLET | Freq: Every day | ORAL | Status: DC
  Administered 2019-06-07: 09:00:00 81 mg via ORAL
  Filled 2019-06-06: qty 1

## 2019-06-06 MED ORDER — FAMOTIDINE 20 MG PO TABS
20.0000 mg | ORAL_TABLET | Freq: Every day | ORAL | Status: DC
  Administered 2019-06-06 – 2019-06-07 (×2): 20 mg via ORAL
  Filled 2019-06-06 (×2): qty 1

## 2019-06-06 NOTE — ED Notes (Signed)
Damita Dunnings [name] notified pt's last BM this morning around 7 or 8 am per pt. Pt laying calmly in bed; alert.

## 2019-06-06 NOTE — ED Notes (Signed)
Attempted report to floor.

## 2019-06-06 NOTE — ED Notes (Signed)
TV adjusted as pt requested. Pt understands BM sample needed next time he has one. Agrees to notify this RN when he does. Will give 8 units insulin per MAR once lunch trays up to unit.

## 2019-06-06 NOTE — ED Notes (Signed)
Charge RN Heather placing transport request for pt to go to floor.

## 2019-06-06 NOTE — ED Notes (Signed)
Pt denies needing to have BM yet.

## 2019-06-06 NOTE — ED Notes (Addendum)
Covers adjusted for pt. Room temp inc for pt. Pt's O2 dec to 2L.

## 2019-06-06 NOTE — ED Notes (Addendum)
Will give insulin once dinner tray at bedside. Pt updated.

## 2019-06-06 NOTE — ED Notes (Signed)
Called pharm d/t missing keppra. Alex in [place] states he will send now.

## 2019-06-06 NOTE — ED Notes (Addendum)
Pt dec from 4L via [place] to 3L. Pt states he is dry; denies being able to produce BM sample currently.

## 2019-06-06 NOTE — Progress Notes (Signed)
Inpatient Diabetes Program Recommendations  AACE/ADA: New Consensus Statement on Inpatient Glycemic Control (2015)  Target Ranges:  Prepandial:   less than 140 mg/dL      Peak postprandial:   less than 180 mg/dL (1-2 hours)      Critically ill patients:  140 - 180 mg/dL   Lab Results  Component Value Date   GLUCAP 274 (H) 06/06/2019   HGBA1C 6.9 (H) 06/05/2019    Review of Glycemic Control Results for Larry Terrell, Larry Terrell (MRN [id number]) as of 06/06/2019 10:57  Ref. Range 06/05/2019 08:23 06/05/2019 12:40 06/05/2019 17:29 06/05/2019 22:10 06/06/2019 07:50  Glucose-Capillary Latest Ref Range: 70 - 99 mg/dL 244 (H) 289 (H) 256 (H) 342 (H) 274 (H)   Diabetes history: DM2 Outpatient Diabetes medications: Metformin 500 mg bid (had been prescribed 70/30 insulin bid 24 units am + 20 units pm on prior admission) Current orders for Inpatient glycemic control: Novolog moderate correction tid  Inpatient Diabetes Program Recommendations:   -Change diet to carb modified -Add Levemir 10 units bid (0.2 units/kg x 104.3 kg)  Thank you, Bethena Roys E. Rether Rison, RN, MSN, CDE  Diabetes Coordinator Inpatient Glycemic Control Team Team Pager [phone] (8am-5pm) 06/06/2019 11:02 AM

## 2019-06-06 NOTE — Progress Notes (Signed)
[facility] Progress Note  Patient: Larry Terrell   PCP: [facility], [place] [facility] DOB: 1960/10/23   DOA: 06/04/2019   DOS: 06/06/2019   Date of Service: the patient was seen and examined on 06/06/2019  Chief Complaint  Patient presents with  . Altered Mental Status   Brief hospital course: Larry Terrell is a 58 y.o. male with medical history significant of  CVA, hx of seizures, CAD s/p NSTEMI, Chronic systolic heart failure, OSA not on CPAP, Type 2 diabetes who presents with concerns of increase confusion. He is unable to recall much of his history. Reportedly cousin who he lives with at home noticed him acting more strange this morning and putting things that don't normally belong into the refrigerator. He was documented to be alert and orient x3 in the ED but had an episode of urinary incontinence which is unusual for him. He report some mild shortness of breath but no new cough or runny nose. Denies any nausea, vomiting or diarrhea.  Denies any dysuria, increased frequency and urgency.  Denies any sick contact.  Currently further plan is continue treat respiratory illness.  Subjective: Breathing improving.  Patient reports that he actually had 3 loose watery bowel movement this morning.  No abdominal pain no nausea no vomiting.  No fever no chills with her mild crampiness occasionally associated with the bowel movements.  Assessment and Plan: Scheduled Meds: . [START ON 06/07/2019] aspirin EC  81 mg Oral Daily  . atorvastatin  40 mg Oral Daily  . clopidogrel  75 mg Oral Q breakfast  . enoxaparin (LOVENOX) injection  40 mg Subcutaneous Q24H  . famotidine  20 mg Oral Daily  . furosemide  40 mg Intravenous BID  . insulin aspart  0-15 Units Subcutaneous TID WC  . insulin detemir  10 Units Subcutaneous BID  . levETIRAcetam  250 mg Oral TID  . losartan  100 mg Oral Daily  . metoprolol succinate  100 mg Oral Daily  . pregabalin  100 mg Oral TID  . venlafaxine  75 mg  Oral TID   Continuous Infusions: PRN Meds:   Sepsis and hypoxia secondary to possible viral pneumonia HFPEF -Chest x-ray shows pulmonary edema versus viral/atypical pneumonia.  PCT is low at 0.19. BNP in 200s. -IV vancomycin and cefepime was given in the ED.  Will discontinue as hypoxia less likely infectious in etiology.. - respiratory viral panel negative -Covid test is negative multiple times - Received 1L NS bolus in ED.  Will give lasix  AKI -Creatinine 1.25 on admission -Follow creatinine following diuresis -Avoid nephrotoxic agent  Diarrhea. Patient reported some diarrhea at home and also reported 3 bowel movements last night. We will perform C. difficile test.  Elevated bilirubin -Bilirubin of 1.9 admission but asymptomatic abdominal exam -Possibly due to vascular congestion - will monitor with CMP in the morning  History of CAD -Continue aspirin  History of CVA -continue aspirin and Plavix -Continue statin  Type 2 diabetes with neuropathy -continue Lyrica - Moderate SSI  Chronic systolic heart failure -Continue metoprolol  Hypertension -Hold losartan due to AKI  Hx of seizure -continue Keppra  GERD -continue PPI  OSA -Not on CPAP  Diet: Cardiac diet and Carb modified diet  DVT Prophylaxis: Subcutaneous Lovenox   Advance goals of care discussion: Full code  Family Communication: no family was present at bedside, at the time of interview.   Disposition:  Discharge to be determined.  Consultants: none Procedures: none  Antibiotics: Anti-infectives (  From admission, onward)   Start     Dose/Rate Route Frequency Ordered Stop   06/04/19 1800  vancomycin (VANCOCIN) IVPB 1000 mg/200 mL premix     1,000 mg 200 mL/hr over 60 Minutes Intravenous  Once 06/04/19 1753 06/04/19 2106   06/04/19 1745  ceFEPIme (MAXIPIME) 2 g in sodium chloride 0.9 % 100 mL IVPB     2 g 200 mL/hr over 30 Minutes Intravenous  Once 06/04/19 1743 06/04/19 1837        Objective: Physical Exam: Vitals:   06/06/19 1845 06/06/19 1854 06/06/19 2028 06/06/19 2029  BP:   (!) 145/64   Pulse: 71 73 76 75  Resp:      Temp:   (!) 97.5 F (36.4 C)   TempSrc:   Oral   SpO2: 95% 95% 91% 92%  Weight:   89.7 kg   Height:        Intake/Output Summary (Last 24 hours) at 06/06/2019 2326 Last data filed at 06/06/2019 2119 Gross per 24 hour  Intake -  Output 950 ml  Net -950 ml   Filed Weights   06/04/19 1753 06/06/19 2028  Weight: 104.3 kg 89.7 kg   General: alert and oriented to time, place, and person. Appear in moderate distress, affect appropriate Eyes: PERRL, Conjunctiva normal ENT: Oral Mucosa Clear, moist  Neck: no JVD, no Abnormal Mass Or lumps Cardiovascular: S1 and S2 Present, no Murmur,  Respiratory: increased respiratory effort, Bilateral Air entry equal and Decreased, no signs of accessory muscle use, bilateral  Crackles, no wheezes Abdomen: Bowel Sound present, Soft and no tenderness, no hernia Skin: no rashes  Extremities: no Pedal edema, no calf tenderness Neurologic: without any new focal findings Gait not checked due to patient safety concerns  Data Reviewed: I have personally reviewed and interpreted daily labs, tele strips, imagings as discussed above. I reviewed all nursing notes, pharmacy notes, vitals, pertinent old records I have discussed plan of care as described above with RN and patient/family.  CBC: Recent Labs  Lab 06/04/19 1741 06/05/19 0452 06/06/19 1415  WBC 13.1* 7.4 6.6  NEUTROABS 11.0*  --  4.6  HGB 13.2 12.5* 12.7*  HCT 38.0* 36.1* 37.3*  MCV 93.4 94.3 96.6  PLT 158 136* 226*   Basic Metabolic Panel: Recent Labs  Lab 06/04/19 1741 06/05/19 0452 06/06/19 1415  NA 134* 136 137  K 4.4 4.3 3.6  CL 100 107 104  CO2 22 22 21*  GLUCOSE 271* 306* 295*  BUN 16 17 18   CREATININE 1.28* 1.22 1.14  CALCIUM 8.8* 8.3* 7.9*  MG  --   --  1.7    Liver Function Tests: Recent Labs  Lab 06/04/19  1741  AST 30  ALT 31  ALKPHOS 155*  BILITOT 1.9*  PROT 7.0  ALBUMIN 3.5   No results for input(s): LIPASE, AMYLASE in the last 168 hours. Recent Labs  Lab 06/04/19 1952  AMMONIA 24   Coagulation Profile: Recent Labs  Lab 06/04/19 1741  INR 1.1   Cardiac Enzymes: No results for input(s): CKTOTAL, CKMB, CKMBINDEX, TROPONINI in the last 168 hours. BNP (last 3 results) No results for input(s): PROBNP in the last 8760 hours. CBG: Recent Labs  Lab 06/05/19 2210 06/06/19 0750 06/06/19 1141 06/06/19 1701 06/06/19 2104  GLUCAP 342* 274* 290* 273* 250*   Studies: Dg Chest Port 1 View  Result Date: 06/06/2019 CLINICAL DATA:  Shortness of breath. EXAM: PORTABLE CHEST 1 VIEW COMPARISON:  Chest x-rays dated 06/04/2019 and  04/17/2019 and 04/01/2019 and CT angiogram dated 01/04/2018 FINDINGS: The interstitial infiltrates on the left have slightly increased. There has been slight improvement in the infiltrates in the right midzone but the infiltrate at the right base has slightly progressed. Heart size and pulmonary vascularity are within normal limits. Aortic atherosclerosis. No discrete effusions. No acute bone abnormality. IMPRESSION: 1. Overall slight progression of the interstitial infiltrates bilaterally. This could represent pneumonia or pulmonary edema. 2. Slight improvement in the infiltrates in the right midzone. Electronically Signed   By: Lorriane Shire M.D.   On: 06/06/2019 13:26     Time spent: 35 minutes  Author: Berle Mull, MD [facility] 06/06/2019 11:26 PM  To reach On-call, see care teams to locate the attending and reach out to them via [email or web address]. If 7PM-7AM, please contact night-coverage If you still have difficulty reaching the attending provider, please page the [facility] (Director on Call) for [facility] on amion for assistance.

## 2019-06-06 NOTE — ED Notes (Signed)
Checked again for pt's keppra. Will give once received.

## 2019-06-07 ENCOUNTER — Other Ambulatory Visit: Payer: Self-pay

## 2019-06-07 DIAGNOSIS — J9601 Acute respiratory failure with hypoxia: Secondary | ICD-10-CM

## 2019-06-07 DIAGNOSIS — E118 Type 2 diabetes mellitus with unspecified complications: Secondary | ICD-10-CM

## 2019-06-07 DIAGNOSIS — N179 Acute kidney failure, unspecified: Secondary | ICD-10-CM

## 2019-06-07 DIAGNOSIS — R404 Transient alteration of awareness: Secondary | ICD-10-CM

## 2019-06-07 DIAGNOSIS — A419 Sepsis, unspecified organism: Principal | ICD-10-CM

## 2019-06-07 DIAGNOSIS — K219 Gastro-esophageal reflux disease without esophagitis: Secondary | ICD-10-CM

## 2019-06-07 DIAGNOSIS — Z87898 Personal history of other specified conditions: Secondary | ICD-10-CM

## 2019-06-07 LAB — BASIC METABOLIC PANEL
Anion gap: 11 (ref 5–15)
BUN: 22 mg/dL — ABNORMAL HIGH (ref 6–20)
CO2: 26 mmol/L (ref 22–32)
Calcium: 8.7 mg/dL — ABNORMAL LOW (ref 8.9–10.3)
Chloride: 102 mmol/L (ref 98–111)
Creatinine, Ser: 1.32 mg/dL — ABNORMAL HIGH (ref 0.61–1.24)
GFR calc Af Amer: >60 mL/min (ref >60)
GFR calc non Af Amer: 58 mL/min — ABNORMAL LOW (ref >60)
Glucose, Bld: 164 mg/dL — ABNORMAL HIGH (ref 70–99)
Potassium: 3.4 mmol/L — ABNORMAL LOW (ref 3.5–5.1)
Sodium: 139 mmol/L (ref 135–145)

## 2019-06-07 LAB — C DIFFICILE QUICK SCREEN W PCR REFLEX
C Diff antigen: NEGATIVE
C Diff interpretation: NOT DETECTED
C Diff toxin: NEGATIVE

## 2019-06-07 LAB — GLUCOSE, CAPILLARY
Glucose-Capillary: 175 mg/dL — ABNORMAL HIGH (ref 70–99)
Glucose-Capillary: 284 mg/dL — ABNORMAL HIGH (ref 70–99)

## 2019-06-07 MED ORDER — LEVOFLOXACIN 500 MG PO TABS: 500.0000 mg | ORAL_TABLET | Freq: Every day | ORAL | 0 refills | Status: AC

## 2019-06-07 MED ORDER — POTASSIUM CHLORIDE CRYS ER 20 MEQ PO TBCR: 40.0000 meq | EXTENDED_RELEASE_TABLET | Freq: Once | ORAL | Status: DC

## 2019-06-07 NOTE — Discharge Summary (Signed)
Physician Discharge Summary  Larry Terrell KOE:[id number] DOB: 1960-08-12 DOA: 06/04/2019  PCP: [facility], [place] date: 06/04/2019 Discharge date: 06/07/2019  Admitted From: Inpatient Disposition: home  Recommendations for Outpatient Follow-up:  1. Follow up with PCP in 1-2 weeks 2. Follow-up with neurology within 1 to 2 weeks  Home Health:No Equipment/Devices:no new equipment  Discharge Condition:Stable CODE STATUS:Full code Diet recommendation: Diabetic diet  Brief/Interim Summary: Larry Terrell a 58 y.o.malewith medical history significant ofCVA, hx of seizures, CAD s/p NSTEMI, Chronic systolic heart failure, OSA not on CPAP, Type 2 diabetes who presents with concerns of increase confusion. He is unable to recall much of his history. Reportedly cousin who he lives with at home noticed him acting more strange this morning and putting things that don't normally belong into the refrigerator. He was documented to be alert and orient x3 in the ED but had an episode of urinary incontinence which is unusual for him. He report some mild shortness of breath but no new cough or runny nose. Denies any nausea, vomiting or diarrhea. Denies any dysuria, increased frequency and urgency. Denies any sick contact.  Hospital course: Hypoxia secondary to mild CHF exacerbation.  Patient's initial evaluate for possible viral pneumonia versus CHF.  He was placed on antibiotics and these were stopped. Blood cultures obtained showing lacto coccus not thought to be a real infection.  Patient's white count is normal, he has been off antibiotics, he is afebrile, his blood pressure stable.  I will complete a course of antibiotics as an outpatient.  Patient also received CHF counseling for management with fluid restriction, daily weights.  I discussed the case with patient's brother at length for close monitoring. Also reported diarrhea during his hospital stay.  Patient denied any further  diarrhea to me.  His respiratory virus panel as noted above was negative his Covid was negative diarrhea has resolved.  C. difficile was negative.  We continue patient's home medications for seizures and diabetes.  Recommended close follow-up with his PCP for continued management of seizures and possible outpatient EEG if needed.  Patient received seizure precautions include not driving.   Discharge Diagnoses:  Principal Problem:   Sepsis ([place]) Active Problems:   Diabetes mellitus with complication (HCC)   Chronic congestive heart failure with left ventricular diastolic dysfunction (HCC)   Acute respiratory failure with hypoxia (HCC)   Stroke (HCC)   Altered mental status   AKI (acute kidney injury) ([place])   GERD (gastroesophageal reflux disease)   History of seizure    Discharge Instructions  Discharge Instructions    Call MD for:  difficulty breathing, headache or visual disturbances   Complete by: As directed    Call MD for:  extreme fatigue   Complete by: As directed    Call MD for:  hives   Complete by: As directed    Call MD for:  persistant dizziness or [place]-headedness   Complete by: As directed    Call MD for:  persistant nausea and vomiting   Complete by: As directed    Call MD for:  severe uncontrolled pain   Complete by: As directed    Call MD for:  temperature >100.4   Complete by: As directed    Diet - low sodium heart healthy   Complete by: As directed    Increase activity slowly   Complete by: As directed      Allergies as of 06/07/2019      Reactions   Gabapentin  Ace Inhibitors Cough   Other reaction(s): Other (See Comments) cough      Medication List    TAKE these medications   aspirin 81 MG chewable tablet Chew 1 tablet (81 mg total) by mouth daily.   atorvastatin 40 MG tablet Commonly known as: LIPITOR Take 40 mg by mouth daily.   clopidogrel 75 MG tablet Commonly known as: PLAVIX Take 1 tablet (75 mg total) by mouth daily with  breakfast.   famotidine 20 MG tablet Commonly known as: Pepcid Take 1 tablet (20 mg total) by mouth 2 (two) times daily.   levETIRAcetam 250 MG tablet Commonly known as: KEPPRA Take 250 mg by mouth 3 (three) times daily.   losartan 100 MG tablet Commonly known as: COZAAR Take 100 mg by mouth daily.   metFORMIN 500 MG tablet Commonly known as: GLUCOPHAGE Take 500 mg by mouth 2 (two) times daily with a meal.   metoprolol succinate 100 MG 24 hr tablet Commonly known as: TOPROL-XL Take 100 mg by mouth daily.   nitroGLYCERIN 0.4 MG SL tablet Commonly known as: NITROSTAT Place 0.4 mg under the tongue every 5 (five) minutes as needed for chest pain.   omeprazole 20 MG capsule Commonly known as: PRILOSEC Take 20 mg by mouth daily as needed for heartburn.   oxyCODONE 5 MG immediate release tablet Commonly known as: Oxy IR/ROXICODONE Take 5 mg by mouth every 8 (eight) hours as needed for pain.   pregabalin 50 MG capsule Commonly known as: Lyrica Take 1 capsule (50 mg total) by mouth 3 (three) times daily.   pregabalin 100 MG capsule Commonly known as: LYRICA Take 100 mg by mouth 3 (three) times daily.   venlafaxine 75 MG tablet Commonly known as: EFFEXOR Take 75 mg by mouth 3 (three) times daily.       Allergies  Allergen Reactions  . Gabapentin   . Ace Inhibitors Cough    Other reaction(s): Other (See Comments) cough    Consultations:  None   Procedures/Studies: Dg Chest Port 1 View  Result Date: 06/06/2019 CLINICAL DATA:  Shortness of breath. EXAM: PORTABLE CHEST 1 VIEW COMPARISON:  Chest x-rays dated 06/04/2019 and 04/17/2019 and 04/01/2019 and CT angiogram dated 01/04/2018 FINDINGS: The interstitial infiltrates on the left have slightly increased. There has been slight improvement in the infiltrates in the right midzone but the infiltrate at the right base has slightly progressed. Heart size and pulmonary vascularity are within normal limits. Aortic  atherosclerosis. No discrete effusions. No acute bone abnormality. IMPRESSION: 1. Overall slight progression of the interstitial infiltrates bilaterally. This could represent pneumonia or pulmonary edema. 2. Slight improvement in the infiltrates in the right midzone. Electronically Signed   By: Lorriane Shire M.D.   On: 06/06/2019 13:26   Dg Chest Port 1 View  Result Date: 06/04/2019 CLINICAL DATA:  Fever EXAM: PORTABLE CHEST 1 VIEW COMPARISON:  04/17/2019 FINDINGS: Stable cardiomegaly. Diffuse bilateral interstitial opacities throughout both lungs. There is a background of chronic interstitial lung disease, as seen on priors. No large pleural fluid collection. No pneumothorax. IMPRESSION: Cardiomegaly with diffuse bilateral interstitial opacities. Findings may represent pulmonary edema versus atypical/viral infection. Electronically Signed   By: Davina Poke M.D.   On: 06/04/2019 18:01       Subjective: Patient reported he felt he was at baseline and requested be discharged home. Discussed with his brother who indicated the patient's had these "spells" since he was 58 years old.  There was no difference in this presentation.  Discharge Exam: Vitals:   06/07/19 0818 06/07/19 1154  BP: (!) 154/82 (!) 145/82  Pulse: 80 75  Resp:  16  Temp:  97.8 F (36.6 C)  SpO2: 92% 92%   Vitals:   06/06/19 2029 06/07/19 0538 06/07/19 0818 06/07/19 1154  BP:  (!) 158/72 (!) 154/82 (!) 145/82  Pulse: 75 73 80 75  Resp:  20  16  Temp:  97.9 F (36.6 C)  97.8 F (36.6 C)  TempSrc:  Oral  Oral  SpO2: 92% 92% 92% 92%  Weight:      Height:        General: Pt is alert, awake, not in acute distress Cardiovascular: RRR, S1/S2 +, no rubs, no gallops Respiratory: CTA bilaterally, no wheezing, no rhonchi Abdominal: Soft, NT, ND, bowel sounds + Extremities: no edema, no cyanosis    The results of significant diagnostics from this hospitalization (including imaging, microbiology, ancillary and  laboratory) are listed below for reference.     Microbiology: Recent Results (from the past 240 hour(s))  Respiratory Panel by PCR     Status: None   Collection Time: 06/04/19  8:08 AM   Specimen: Nasopharyngeal Swab; Respiratory  Result Value Ref Range Status   Adenovirus NOT DETECTED NOT DETECTED Final   Coronavirus 229E NOT DETECTED NOT DETECTED Final    Comment: (NOTE) The Coronavirus on the Respiratory Panel, DOES NOT test for the novel  Coronavirus (2019 nCoV)    Coronavirus HKU1 NOT DETECTED NOT DETECTED Final   Coronavirus NL63 NOT DETECTED NOT DETECTED Final   Coronavirus OC43 NOT DETECTED NOT DETECTED Final   Metapneumovirus NOT DETECTED NOT DETECTED Final   Rhinovirus / Enterovirus NOT DETECTED NOT DETECTED Final   Influenza A NOT DETECTED NOT DETECTED Final   Influenza B NOT DETECTED NOT DETECTED Final   Parainfluenza Virus 1 NOT DETECTED NOT DETECTED Final   Parainfluenza Virus 2 NOT DETECTED NOT DETECTED Final   Parainfluenza Virus 3 NOT DETECTED NOT DETECTED Final   Parainfluenza Virus 4 NOT DETECTED NOT DETECTED Final   Respiratory Syncytial Virus NOT DETECTED NOT DETECTED Final   Bordetella pertussis NOT DETECTED NOT DETECTED Final   Chlamydophila pneumoniae NOT DETECTED NOT DETECTED Final   Mycoplasma pneumoniae NOT DETECTED NOT DETECTED Final    Comment: Performed at [facility], [place]. [address], [place], [place] [address]  Blood Culture (routine x 2)     Status: Abnormal   Collection Time: 06/04/19  5:41 PM   Specimen: BLOOD  Result Value Ref Range Status   Specimen Description   Final    BLOOD BLOOD LEFT HAND Performed at [facility], [address] [place], [place] [address]    Special Requests   Final    AEROBIC BOTTLE ONLY Blood Culture results may not be optimal due to an inadequate volume of blood received in culture bottles Performed at [facility], [place] [place], [place] [address]    Culture  Setup Time    Final    GRAM POSITIVE COCCI AEROBIC BOTTLE ONLY CRITICAL RESULT CALLED TO, READ BACK BY AND VERIFIED WITHRayna Sexton AT 6153 ON 06/05/2019 JJB Performed at [facility], [address] [place], [place] [address]    Culture (A)  Final    LACTOCOCCUS LACTIS SSP LACTIS Standardized susceptibility testing for this organism is not available. Performed at [facility], [place] [address], [place],  [address]    Report Status 06/06/2019 FINAL  Final  Blood Culture (routine x 2)     Status: None (Preliminary result)   Collection Time: 06/04/19  5:41 PM   Specimen: BLOOD  Result Value Ref Range Status   Specimen Description BLOOD R HAND  Final   Special Requests   Final    BOTTLES DRAWN AEROBIC AND ANAEROBIC Blood Culture adequate volume   Culture   Final    NO GROWTH 3 DAYS Performed at [facility], [address], [place], [place] [address]    Report Status PENDING  Incomplete  Urine culture     Status: None   Collection Time: 06/04/19  5:41 PM   Specimen: In/Out Cath Urine  Result Value Ref Range Status   Specimen Description   Final    IN/OUT CATH URINE Performed at [facility] [address], [place], [place] [address]    Special Requests   Final    NONE Performed at [facility], [address] [place], [place] [address]    Culture   Final    NO GROWTH Performed at [facility], [place] [address], [place], [place] [address]    Report Status 06/05/2019 FINAL  Final  SARS CORONAVIRUS 2 (TAT 6-24 HRS) Nasopharyngeal Nasopharyngeal Swab     Status: None   Collection Time: 06/04/19  7:52 PM   Specimen: Nasopharyngeal Swab  Result Value Ref Range Status   SARS Coronavirus 2 NEGATIVE NEGATIVE Final    Comment: (NOTE) SARS-CoV-2 target nucleic acids are NOT DETECTED. The SARS-CoV-2 RNA is generally detectable in upper and lower respiratory specimens during the acute phase of infection. Negative results do not preclude  SARS-CoV-2 infection, do not rule out co-infections with other pathogens, and should not be used as the sole basis for treatment or other patient management decisions. Negative results must be combined with clinical observations, patient history, and epidemiological information. The expected result is Negative. Fact Sheet for Patients: SugarRoll.be Fact Sheet for Healthcare Providers: https://www.woods-mathews.com/ This test is not yet approved or cleared by the [place] FDA and  has been authorized for detection and/or diagnosis of SARS-CoV-2 by FDA under an Emergency Use Authorization (EUA). This EUA will remain  in effect (meaning this test can be used) for the duration of the COVID-19 declaration under Section 56 4(b)(1) of the Act, 21 U.S.C. section 360bbb-3(b)(1), unless the authorization is terminated or revoked sooner. Performed at [facility], [place] [address] [place], [place] [address]   Novel Coronavirus, NAA (Hosp order, Send-out to Ref Lab; TAT 18-24 hrs     Status: None   Collection Time: 06/05/19  8:07 AM   Specimen: Nasopharyngeal Swab; Respiratory  Result Value Ref Range Status   SARS-CoV-2, NAA NOT DETECTED NOT DETECTED Final    Comment: (NOTE) This nucleic acid amplification test was developed and its performance characteristics determined by [employer]. Nucleic acid amplification tests include PCR and TMA. This test has not been FDA cleared or approved. This test has been authorized by FDA under an Emergency Use Authorization (EUA). This test is only authorized for the duration of time the declaration that circumstances exist justifying the authorization of the emergency use of in vitro diagnostic tests for detection of SARS-CoV-2 virus and/or diagnosis of COVID-19 infection under section 564(b)(1) of the Act, 21 U.S.C. 412INO-6(V) (1), unless the authorization is terminated or revoked sooner. When  diagnostic testing is negative, the possibility of a false negative result should be considered in the context of a patient's recent exposures  and the presence of clinical signs and symptoms consistent with COVID-19. An individual without symptoms of COVID- 19 and who is not shedding SARS-CoV-2 vi rus would expect to have a negative (not detected) result in this assay. Performed At: [facility] [address] [place], [place] [id number] Rush Farmer MD LP:[id number]    [place]  Final    Comment: Performed at [facility], [place] [place], [place] [address]  C difficile quick scan w PCR reflex     Status: None   Collection Time: 06/07/19  9:57 AM   Specimen: STOOL  Result Value Ref Range Status   C Diff antigen NEGATIVE NEGATIVE Final   C Diff toxin NEGATIVE NEGATIVE Final   C Diff interpretation No C. difficile detected.  Final    Comment: Performed at [facility], [place] [place], [place] [address]     Labs: BNP (last 3 results) Recent Labs    03/22/19 1016 06/04/19 1741  BNP 78.0 341.9*   Basic Metabolic Panel: Recent Labs  Lab 06/04/19 1741 06/05/19 0452 06/06/19 1415 06/07/19 0533  NA 134* 136 137 139  K 4.4 4.3 3.6 3.4*  CL 100 107 104 102  CO2 22 22 21* 26  GLUCOSE 271* 306* 295* 164*  BUN 16 17 18  22*  CREATININE 1.28* 1.22 1.14 1.32*  CALCIUM 8.8* 8.3* 7.9* 8.7*  MG  --   --  1.7  --    Liver Function Tests: Recent Labs  Lab 06/04/19 1741  AST 30  ALT 31  ALKPHOS 155*  BILITOT 1.9*  PROT 7.0  ALBUMIN 3.5   No results for input(s): LIPASE, AMYLASE in the last 168 hours. Recent Labs  Lab 06/04/19 1952  AMMONIA 24   CBC: Recent Labs  Lab 06/04/19 1741 06/05/19 0452 06/06/19 1415  WBC 13.1* 7.4 6.6  NEUTROABS 11.0*  --  4.6  HGB 13.2 12.5* 12.7*  HCT 38.0* 36.1* 37.3*  MCV 93.4 94.3 96.6  PLT 158 136* 136*   Cardiac Enzymes: No results for input(s): CKTOTAL,  CKMB, CKMBINDEX, TROPONINI in the last 168 hours. BNP: Invalid input(s): POCBNP CBG: Recent Labs  Lab 06/06/19 1141 06/06/19 1701 06/06/19 2104 06/07/19 0750 06/07/19 1152  GLUCAP 290* 273* 250* 175* 284*   D-Dimer No results for input(s): DDIMER in the last 72 hours. Hgb A1c Recent Labs    06/05/19 0452  HGBA1C 6.9*   Lipid Profile No results for input(s): CHOL, HDL, LDLCALC, TRIG, CHOLHDL, LDLDIRECT in the last 72 hours. Thyroid function studies No results for input(s): TSH, T4TOTAL, T3FREE, THYROIDAB in the last 72 hours.  Invalid input(s): FREET3 Anemia work up No results for input(s): VITAMINB12, FOLATE, FERRITIN, TIBC, IRON, RETICCTPCT in the last 72 hours. Urinalysis    Component Value Date/Time   COLORURINE YELLOW (A) 06/04/2019 1741   APPEARANCEUR CLEAR (A) 06/04/2019 1741   APPEARANCEUR Clear 11/06/2014 1527   LABSPEC 1.014 06/04/2019 1741   LABSPEC 1.008 11/06/2014 1527   PHURINE 6.0 06/04/2019 1741   GLUCOSEU >=500 (A) 06/04/2019 1741   GLUCOSEU Negative 11/06/2014 1527   HGBUR NEGATIVE 06/04/2019 1741   BILIRUBINUR NEGATIVE 06/04/2019 1741   BILIRUBINUR Negative 11/06/2014 1527   KETONESUR NEGATIVE 06/04/2019 1741   PROTEINUR 100 (A) 06/04/2019 1741   NITRITE NEGATIVE 06/04/2019 1741   LEUKOCYTESUR NEGATIVE 06/04/2019 1741   LEUKOCYTESUR Negative 11/06/2014 1527   Sepsis Labs Invalid input(s): PROCALCITONIN,  WBC,  LACTICIDVEN Microbiology Recent Results (from the past 240 hour(s))  Respiratory Panel by PCR     Status: None   Collection Time: 06/04/19  8:08 AM   Specimen: Nasopharyngeal Swab; Respiratory  Result Value Ref Range Status   Adenovirus NOT DETECTED NOT DETECTED Final   Coronavirus 229E NOT DETECTED NOT DETECTED Final    Comment: (NOTE) The Coronavirus on the Respiratory Panel, DOES NOT test for the novel  Coronavirus (2019 nCoV)    Coronavirus HKU1 NOT DETECTED NOT DETECTED Final   Coronavirus NL63 NOT DETECTED NOT DETECTED  Final   Coronavirus OC43 NOT DETECTED NOT DETECTED Final   Metapneumovirus NOT DETECTED NOT DETECTED Final   Rhinovirus / Enterovirus NOT DETECTED NOT DETECTED Final   Influenza A NOT DETECTED NOT DETECTED Final   Influenza B NOT DETECTED NOT DETECTED Final   Parainfluenza Virus 1 NOT DETECTED NOT DETECTED Final   Parainfluenza Virus 2 NOT DETECTED NOT DETECTED Final   Parainfluenza Virus 3 NOT DETECTED NOT DETECTED Final   Parainfluenza Virus 4 NOT DETECTED NOT DETECTED Final   Respiratory Syncytial Virus NOT DETECTED NOT DETECTED Final   Bordetella pertussis NOT DETECTED NOT DETECTED Final   Chlamydophila pneumoniae NOT DETECTED NOT DETECTED Final   Mycoplasma pneumoniae NOT DETECTED NOT DETECTED Final    Comment: Performed at [facility], [place]. [address] [place], [place] [address]  Blood Culture (routine x 2)     Status: Abnormal   Collection Time: 06/04/19  5:41 PM   Specimen: BLOOD  Result Value Ref Range Status   Specimen Description   Final    BLOOD BLOOD LEFT HAND Performed at [facility], [address] [place], [place] [address]    Special Requests   Final    AEROBIC BOTTLE ONLY Blood Culture results may not be optimal due to an inadequate volume of blood received in culture bottles Performed at [facility], [address] [place], [place] [address]    Culture  Setup Time   Final    GRAM POSITIVE COCCI AEROBIC BOTTLE ONLY CRITICAL RESULT CALLED TO, READ BACK BY AND VERIFIED WITHRayna Sexton AT 6578 ON 06/05/2019 JJB Performed at [facility], [address], [place], [place] [address]    Culture (A)  Final    LACTOCOCCUS LACTIS SSP LACTIS Standardized susceptibility testing for this organism is not available. Performed at [facility], [place] [address], [place], [place] [address]    Report Status 06/06/2019 FINAL  Final  Blood Culture (routine x 2)     Status: None (Preliminary result)   Collection Time: 06/04/19  5:41  PM   Specimen: BLOOD  Result Value Ref Range Status   Specimen Description BLOOD R HAND  Final   Special Requests   Final    BOTTLES DRAWN AEROBIC AND ANAEROBIC Blood Culture adequate volume   Culture   Final    NO GROWTH 3 DAYS Performed at [facility], [address] [place], [place] [address]    Report Status PENDING  Incomplete  Urine culture     Status: None   Collection Time: 06/04/19  5:41 PM   Specimen: In/Out Cath Urine  Result Value Ref Range Status   Specimen Description   Final    IN/OUT CATH URINE Performed at [facility], [address] [place], [place] [address]    Special Requests   Final    NONE Performed at [facility], [address], [place],  [address]    Culture   Final  NO GROWTH Performed at [facility], [place] [address], [place], [place] [address]    Report Status 06/05/2019 FINAL  Final  SARS CORONAVIRUS 2 (TAT 6-24 HRS) Nasopharyngeal Nasopharyngeal Swab     Status: None   Collection Time: 06/04/19  7:52 PM   Specimen: Nasopharyngeal Swab  Result Value Ref Range Status   SARS Coronavirus 2 NEGATIVE NEGATIVE Final    Comment: (NOTE) SARS-CoV-2 target nucleic acids are NOT DETECTED. The SARS-CoV-2 RNA is generally detectable in upper and lower respiratory specimens during the acute phase of infection. Negative results do not preclude SARS-CoV-2 infection, do not rule out co-infections with other pathogens, and should not be used as the sole basis for treatment or other patient management decisions. Negative results must be combined with clinical observations, patient history, and epidemiological information. The expected result is Negative. Fact Sheet for Patients: SugarRoll.be Fact Sheet for Healthcare Providers: https://www.woods-mathews.com/ This test is not yet approved or cleared by the [place] FDA and  has been authorized for detection and/or  diagnosis of SARS-CoV-2 by FDA under an Emergency Use Authorization (EUA). This EUA will remain  in effect (meaning this test can be used) for the duration of the COVID-19 declaration under Section 56 4(b)(1) of the Act, 21 U.S.C. section 360bbb-3(b)(1), unless the authorization is terminated or revoked sooner. Performed at [facility], [place] [address], [place], [place] [address]   Novel Coronavirus, NAA (Hosp order, Send-out to Ref Lab; TAT 18-24 hrs     Status: None   Collection Time: 06/05/19  8:07 AM   Specimen: Nasopharyngeal Swab; Respiratory  Result Value Ref Range Status   SARS-CoV-2, NAA NOT DETECTED NOT DETECTED Final    Comment: (NOTE) This nucleic acid amplification test was developed and its performance characteristics determined by [employer]. Nucleic acid amplification tests include PCR and TMA. This test has not been FDA cleared or approved. This test has been authorized by FDA under an Emergency Use Authorization (EUA). This test is only authorized for the duration of time the declaration that circumstances exist justifying the authorization of the emergency use of in vitro diagnostic tests for detection of SARS-CoV-2 virus and/or diagnosis of COVID-19 infection under section 564(b)(1) of the Act, 21 U.S.C. 563SLH-7(D) (1), unless the authorization is terminated or revoked sooner. When diagnostic testing is negative, the possibility of a false negative result should be considered in the context of a patient's recent exposures and the presence of clinical signs and symptoms consistent with COVID-19. An individual without symptoms of COVID- 19 and who is not shedding SARS-CoV-2 vi rus would expect to have a negative (not detected) result in this assay. Performed At: [facility] [address] [place], [place] [id number] Rush Farmer MD [id number]    [place]  Final    Comment: Performed at [facility], [address] [place], [place] [address]  C difficile quick scan w PCR reflex     Status: None   Collection Time: 06/07/19  9:57 AM   Specimen: STOOL  Result Value Ref Range Status   C Diff antigen NEGATIVE NEGATIVE Final   C Diff toxin NEGATIVE NEGATIVE Final   C Diff interpretation No C. difficile detected.  Final    Comment: Performed at [facility], [address], [place], [place] [address]     Time coordinating discharge: Over 30 minutes  SIGNED:   Nicolette Bang, MD  [facility] 06/07/2019, 1:17 PM Pager   If 7PM-7AM,  please contact night-coverage [email or web address] Password [email or web address]

## 2019-06-07 NOTE — Progress Notes (Signed)
Larry Terrell to be D/C'd home per MD order.  Discussed prescriptions and follow up appointments with the patient. Prescriptions given to patient, medication list explained in detail. Pt verbalized understanding.  Allergies as of 06/07/2019      Reactions   Gabapentin    Ace Inhibitors Cough   Other reaction(s): Other (See Comments) cough      Medication List    TAKE these medications   aspirin 81 MG chewable tablet Chew 1 tablet (81 mg total) by mouth daily.   atorvastatin 40 MG tablet Commonly known as: LIPITOR Take 40 mg by mouth daily.   clopidogrel 75 MG tablet Commonly known as: PLAVIX Take 1 tablet (75 mg total) by mouth daily with breakfast.   famotidine 20 MG tablet Commonly known as: Pepcid Take 1 tablet (20 mg total) by mouth 2 (two) times daily.   levETIRAcetam 250 MG tablet Commonly known as: KEPPRA Take 250 mg by mouth 3 (three) times daily.   levofloxacin 500 MG tablet Commonly known as: LEVAQUIN Take 1 tablet (500 mg total) by mouth daily for 7 days.   losartan 100 MG tablet Commonly known as: COZAAR Take 100 mg by mouth daily.   metFORMIN 500 MG tablet Commonly known as: GLUCOPHAGE Take 500 mg by mouth 2 (two) times daily with a meal.   metoprolol succinate 100 MG 24 hr tablet Commonly known as: TOPROL-XL Take 100 mg by mouth daily.   nitroGLYCERIN 0.4 MG SL tablet Commonly known as: NITROSTAT Place 0.4 mg under the tongue every 5 (five) minutes as needed for chest pain.   omeprazole 20 MG capsule Commonly known as: PRILOSEC Take 20 mg by mouth daily as needed for heartburn.   oxyCODONE 5 MG immediate release tablet Commonly known as: Oxy IR/ROXICODONE Take 5 mg by mouth every 8 (eight) hours as needed for pain.   pregabalin 50 MG capsule Commonly known as: Lyrica Take 1 capsule (50 mg total) by mouth 3 (three) times daily.   pregabalin 100 MG capsule Commonly known as: LYRICA Take 100 mg by mouth 3 (three) times daily.    venlafaxine 75 MG tablet Commonly known as: EFFEXOR Take 75 mg by mouth 3 (three) times daily.       Vitals:   06/07/19 0818 06/07/19 1154  BP: (!) 154/82 (!) 145/82  Pulse: 80 75  Resp:  16  Temp:  97.8 F (36.6 C)  SpO2: 92% 92%    Skin clean, dry and intact without evidence of skin break down, no evidence of skin tears noted. IV catheter discontinued intact. Site without signs and symptoms of complications. Dressing and pressure applied. Pt denies pain at this time. No complaints noted.  An After Visit Summary was printed and given to the patient. Patient escorted via [place], and D/C home via private auto.  Chuck Hint RN [facility] [employer]

## 2019-06-09 LAB — CULTURE, BLOOD (ROUTINE X 2)
Culture: NO GROWTH
Special Requests: ADEQUATE

## 2019-10-18 ENCOUNTER — Emergency Department: Payer: Medicare HMO

## 2019-10-18 ENCOUNTER — Inpatient Hospital Stay
Admission: EM | Admit: 2019-10-18 | Discharge: 2019-10-20 | DRG: 280 | Disposition: A | Discharge status: Other Acute Inpatient Hospital | Payer: Medicare HMO | Attending: Internal Medicine | Admitting: Internal Medicine

## 2019-10-18 ENCOUNTER — Other Ambulatory Visit: Payer: Self-pay

## 2019-10-18 DIAGNOSIS — I130 Hypertensive heart and chronic kidney disease with heart failure and stage 1 through stage 4 chronic kidney disease, or unspecified chronic kidney disease: Secondary | ICD-10-CM | POA: Diagnosis present

## 2019-10-18 DIAGNOSIS — R197 Diarrhea, unspecified: Secondary | ICD-10-CM | POA: Diagnosis present

## 2019-10-18 DIAGNOSIS — I25110 Atherosclerotic heart disease of native coronary artery with unstable angina pectoris: Secondary | ICD-10-CM | POA: Diagnosis present

## 2019-10-18 DIAGNOSIS — E118 Type 2 diabetes mellitus with unspecified complications: Secondary | ICD-10-CM | POA: Diagnosis present

## 2019-10-18 DIAGNOSIS — I214 Non-ST elevation (NSTEMI) myocardial infarction: Principal | ICD-10-CM | POA: Diagnosis present

## 2019-10-18 DIAGNOSIS — F329 Major depressive disorder, single episode, unspecified: Secondary | ICD-10-CM | POA: Diagnosis present

## 2019-10-18 DIAGNOSIS — N189 Chronic kidney disease, unspecified: Secondary | ICD-10-CM | POA: Diagnosis present

## 2019-10-18 DIAGNOSIS — Z823 Family history of stroke: Secondary | ICD-10-CM

## 2019-10-18 DIAGNOSIS — R079 Chest pain, unspecified: Secondary | ICD-10-CM | POA: Diagnosis present

## 2019-10-18 DIAGNOSIS — N179 Acute kidney failure, unspecified: Secondary | ICD-10-CM | POA: Diagnosis present

## 2019-10-18 DIAGNOSIS — G4733 Obstructive sleep apnea (adult) (pediatric): Secondary | ICD-10-CM | POA: Diagnosis present

## 2019-10-18 DIAGNOSIS — Z7902 Long term (current) use of antithrombotics/antiplatelets: Secondary | ICD-10-CM

## 2019-10-18 DIAGNOSIS — Z8673 Personal history of transient ischemic attack (TIA), and cerebral infarction without residual deficits: Secondary | ICD-10-CM

## 2019-10-18 DIAGNOSIS — I200 Unstable angina: Secondary | ICD-10-CM | POA: Diagnosis present

## 2019-10-18 DIAGNOSIS — I739 Peripheral vascular disease, unspecified: Secondary | ICD-10-CM

## 2019-10-18 DIAGNOSIS — J449 Chronic obstructive pulmonary disease, unspecified: Secondary | ICD-10-CM | POA: Diagnosis present

## 2019-10-18 DIAGNOSIS — Z6830 Body mass index (BMI) 30.0-30.9, adult: Secondary | ICD-10-CM

## 2019-10-18 DIAGNOSIS — I440 Atrioventricular block, first degree: Secondary | ICD-10-CM | POA: Diagnosis present

## 2019-10-18 DIAGNOSIS — T82855A Stenosis of coronary artery stent, initial encounter: Secondary | ICD-10-CM | POA: Diagnosis present

## 2019-10-18 DIAGNOSIS — I252 Old myocardial infarction: Secondary | ICD-10-CM

## 2019-10-18 DIAGNOSIS — E1151 Type 2 diabetes mellitus with diabetic peripheral angiopathy without gangrene: Secondary | ICD-10-CM | POA: Diagnosis present

## 2019-10-18 DIAGNOSIS — E1122 Type 2 diabetes mellitus with diabetic chronic kidney disease: Secondary | ICD-10-CM | POA: Diagnosis present

## 2019-10-18 DIAGNOSIS — I5033 Acute on chronic diastolic (congestive) heart failure: Secondary | ICD-10-CM | POA: Diagnosis not present

## 2019-10-18 DIAGNOSIS — G40909 Epilepsy, unspecified, not intractable, without status epilepticus: Secondary | ICD-10-CM | POA: Diagnosis present

## 2019-10-18 DIAGNOSIS — E1165 Type 2 diabetes mellitus with hyperglycemia: Secondary | ICD-10-CM | POA: Diagnosis not present

## 2019-10-18 DIAGNOSIS — Z20822 Contact with and (suspected) exposure to covid-19: Secondary | ICD-10-CM | POA: Diagnosis present

## 2019-10-18 DIAGNOSIS — Z87891 Personal history of nicotine dependence: Secondary | ICD-10-CM

## 2019-10-18 DIAGNOSIS — Y831 Surgical operation with implant of artificial internal device as the cause of abnormal reaction of the patient, or of later complication, without mention of misadventure at the time of the procedure: Secondary | ICD-10-CM | POA: Diagnosis present

## 2019-10-18 DIAGNOSIS — Z7982 Long term (current) use of aspirin: Secondary | ICD-10-CM

## 2019-10-18 DIAGNOSIS — E785 Hyperlipidemia, unspecified: Secondary | ICD-10-CM | POA: Diagnosis present

## 2019-10-18 DIAGNOSIS — Z79899 Other long term (current) drug therapy: Secondary | ICD-10-CM

## 2019-10-18 DIAGNOSIS — E669 Obesity, unspecified: Secondary | ICD-10-CM | POA: Diagnosis present

## 2019-10-18 DIAGNOSIS — Z820 Family history of epilepsy and other diseases of the nervous system: Secondary | ICD-10-CM

## 2019-10-18 DIAGNOSIS — Z8249 Family history of ischemic heart disease and other diseases of the circulatory system: Secondary | ICD-10-CM

## 2019-10-18 DIAGNOSIS — Z7984 Long term (current) use of oral hypoglycemic drugs: Secondary | ICD-10-CM

## 2019-10-18 DIAGNOSIS — E1140 Type 2 diabetes mellitus with diabetic neuropathy, unspecified: Secondary | ICD-10-CM | POA: Diagnosis present

## 2019-10-18 DIAGNOSIS — M316 Other giant cell arteritis: Secondary | ICD-10-CM | POA: Diagnosis present

## 2019-10-18 LAB — CBC
HCT: 39.4 % (ref 39.0–52.0)
Hemoglobin: 14.3 g/dL (ref 13.0–17.0)
MCH: 33.3 pg (ref 26.0–34.0)
MCHC: 36.3 g/dL — ABNORMAL HIGH (ref 30.0–36.0)
MCV: 91.6 fL (ref 80.0–100.0)
Platelets: 172 10*3/uL (ref 150–400)
RBC: 4.3 MIL/uL (ref 4.22–5.81)
RDW: 14.9 % (ref 11.5–15.5)
WBC: 8.9 10*3/uL (ref 4.0–10.5)
nRBC: 0 % (ref 0.0–0.2)

## 2019-10-18 LAB — BASIC METABOLIC PANEL
Anion gap: 8 (ref 5–15)
BUN: 19 mg/dL (ref 8–23)
CO2: 24 mmol/L (ref 22–32)
Calcium: 9 mg/dL (ref 8.9–10.3)
Chloride: 101 mmol/L (ref 98–111)
Creatinine, Ser: 1.47 mg/dL — ABNORMAL HIGH (ref 0.61–1.24)
GFR calc Af Amer: 59 mL/min — ABNORMAL LOW (ref >60)
GFR calc non Af Amer: 51 mL/min — ABNORMAL LOW (ref >60)
Glucose, Bld: 366 mg/dL — ABNORMAL HIGH (ref 70–99)
Potassium: 4.6 mmol/L (ref 3.5–5.1)
Sodium: 133 mmol/L — ABNORMAL LOW (ref 135–145)

## 2019-10-18 LAB — TROPONIN I (HIGH SENSITIVITY)
Troponin I (High Sensitivity): 37 ng/L — ABNORMAL HIGH (ref <18)
Troponin I (High Sensitivity): 50 ng/L — ABNORMAL HIGH (ref <18)

## 2019-10-18 LAB — GLUCOSE, CAPILLARY: Glucose-Capillary: 246 mg/dL — ABNORMAL HIGH (ref 70–99)

## 2019-10-18 LAB — BRAIN NATRIURETIC PEPTIDE: B Natriuretic Peptide: 177 pg/mL — ABNORMAL HIGH (ref 0.0–100.0)

## 2019-10-18 MED ORDER — SODIUM CHLORIDE 0.9 % IV SOLN: INTRAVENOUS | Status: DC

## 2019-10-18 MED ORDER — LIDOCAINE VISCOUS HCL 2 % MT SOLN
15.0000 mL | Freq: Once | OROMUCOSAL | Status: AC
  Administered 2019-10-18: 21:00:00 15 mL via ORAL
  Filled 2019-10-18: qty 15

## 2019-10-18 MED ORDER — PREGABALIN 50 MG PO CAPS: 100.0000 mg | ORAL_CAPSULE | Freq: Three times a day (TID) | ORAL | Status: DC

## 2019-10-18 MED ORDER — ATORVASTATIN CALCIUM 20 MG PO TABS
40.0000 mg | ORAL_TABLET | Freq: Every day | ORAL | Status: DC
  Administered 2019-10-19 – 2019-10-20 (×2): 40 mg via ORAL
  Filled 2019-10-18 (×3): qty 2

## 2019-10-18 MED ORDER — METOPROLOL SUCCINATE ER 100 MG PO TB24
100.0000 mg | ORAL_TABLET | Freq: Every day | ORAL | Status: DC
  Administered 2019-10-18 – 2019-10-20 (×3): 100 mg via ORAL
  Filled 2019-10-18: qty 2
  Filled 2019-10-18: qty 1
  Filled 2019-10-18: qty 2

## 2019-10-18 MED ORDER — LOSARTAN POTASSIUM 50 MG PO TABS
100.0000 mg | ORAL_TABLET | Freq: Every day | ORAL | Status: DC
  Administered 2019-10-18: 21:00:00 100 mg via ORAL
  Filled 2019-10-18: qty 2

## 2019-10-18 MED ORDER — ENOXAPARIN SODIUM 40 MG/0.4ML ~~LOC~~ SOLN
40.0000 mg | SUBCUTANEOUS | Status: DC
  Administered 2019-10-18: 21:00:00 40 mg via SUBCUTANEOUS
  Filled 2019-10-18: qty 0.4

## 2019-10-18 MED ORDER — MORPHINE SULFATE (PF) 2 MG/ML IV SOLN: 2.0000 mg | INTRAVENOUS | Status: DC | PRN

## 2019-10-18 MED ORDER — ZOLPIDEM TARTRATE 5 MG PO TABS: 5.0000 mg | ORAL_TABLET | Freq: Every evening | ORAL | Status: DC | PRN

## 2019-10-18 MED ORDER — NITROGLYCERIN 0.4 MG SL SUBL: 0.4000 mg | SUBLINGUAL_TABLET | SUBLINGUAL | Status: DC | PRN

## 2019-10-18 MED ORDER — PANTOPRAZOLE SODIUM 40 MG PO TBEC
40.0000 mg | DELAYED_RELEASE_TABLET | Freq: Every day | ORAL | Status: DC
  Administered 2019-10-18 – 2019-10-20 (×3): 40 mg via ORAL
  Filled 2019-10-18 (×3): qty 1

## 2019-10-18 MED ORDER — ASPIRIN 81 MG PO CHEW
324.0000 mg | CHEWABLE_TABLET | Freq: Once | ORAL | Status: AC
  Administered 2019-10-18: 21:00:00 324 mg via ORAL
  Filled 2019-10-18: qty 4

## 2019-10-18 MED ORDER — ONDANSETRON HCL 4 MG/2ML IJ SOLN: 4.0000 mg | Freq: Four times a day (QID) | INTRAMUSCULAR | Status: DC | PRN

## 2019-10-18 MED ORDER — ACETAMINOPHEN 325 MG PO TABS
650.0000 mg | ORAL_TABLET | ORAL | Status: DC | PRN
  Administered 2019-10-18: 21:00:00 650 mg via ORAL
  Filled 2019-10-18: qty 2

## 2019-10-18 MED ORDER — LEVETIRACETAM 250 MG PO TABS
250.0000 mg | ORAL_TABLET | Freq: Three times a day (TID) | ORAL | Status: DC
  Administered 2019-10-19: 250 mg via ORAL
  Filled 2019-10-18 (×4): qty 1

## 2019-10-18 MED ORDER — PREGABALIN 50 MG PO CAPS
50.0000 mg | ORAL_CAPSULE | Freq: Three times a day (TID) | ORAL | Status: DC
  Administered 2019-10-18 – 2019-10-19 (×4): 50 mg via ORAL
  Filled 2019-10-18 (×5): qty 1

## 2019-10-18 MED ORDER — OXYCODONE HCL 5 MG PO TABS
5.0000 mg | ORAL_TABLET | ORAL | Status: DC | PRN
  Administered 2019-10-19 – 2019-10-20 (×4): 5 mg via ORAL
  Filled 2019-10-18 (×4): qty 1

## 2019-10-18 MED ORDER — FUROSEMIDE 10 MG/ML IJ SOLN
40.0000 mg | Freq: Two times a day (BID) | INTRAMUSCULAR | Status: DC
  Administered 2019-10-19 – 2019-10-20 (×3): 40 mg via INTRAVENOUS
  Filled 2019-10-18 (×3): qty 4

## 2019-10-18 MED ORDER — CLOPIDOGREL BISULFATE 75 MG PO TABS
75.0000 mg | ORAL_TABLET | Freq: Every day | ORAL | Status: DC
  Administered 2019-10-19: 11:00:00 75 mg via ORAL
  Filled 2019-10-18: qty 1

## 2019-10-18 MED ORDER — SODIUM CHLORIDE 0.9% FLUSH: 3.0000 mL | Freq: Once | INTRAVENOUS | Status: DC

## 2019-10-18 MED ORDER — ALUM & MAG HYDROXIDE-SIMETH 200-200-20 MG/5ML PO SUSP
30.0000 mL | Freq: Once | ORAL | Status: AC
  Administered 2019-10-18: 21:00:00 30 mL via ORAL
  Filled 2019-10-18: qty 30

## 2019-10-18 MED ORDER — VENLAFAXINE HCL 37.5 MG PO TABS
75.0000 mg | ORAL_TABLET | Freq: Three times a day (TID) | ORAL | Status: DC
  Administered 2019-10-19 – 2019-10-20 (×4): 75 mg via ORAL
  Filled 2019-10-18 (×7): qty 2

## 2019-10-18 MED ORDER — LABETALOL HCL 5 MG/ML IV SOLN: 20.0000 mg | INTRAVENOUS | Status: DC | PRN

## 2019-10-18 MED ORDER — INSULIN ASPART 100 UNIT/ML ~~LOC~~ SOLN
0.0000 [IU] | Freq: Three times a day (TID) | SUBCUTANEOUS | Status: DC
  Administered 2019-10-18: 23:00:00 5 [IU] via SUBCUTANEOUS
  Administered 2019-10-19: 15 [IU] via SUBCUTANEOUS
  Administered 2019-10-19: 18:00:00 11 [IU] via SUBCUTANEOUS
  Administered 2019-10-20: 13:00:00 2 [IU] via SUBCUTANEOUS
  Administered 2019-10-20: 09:00:00 8 [IU] via SUBCUTANEOUS
  Filled 2019-10-18 (×5): qty 1

## 2019-10-18 MED ORDER — ALPRAZOLAM 0.25 MG PO TABS
0.2500 mg | ORAL_TABLET | Freq: Two times a day (BID) | ORAL | Status: DC | PRN
  Administered 2019-10-19: 21:00:00 0.25 mg via ORAL
  Filled 2019-10-18: qty 1

## 2019-10-18 MED ORDER — AMLODIPINE BESYLATE 5 MG PO TABS
5.0000 mg | ORAL_TABLET | Freq: Every day | ORAL | Status: DC
  Administered 2019-10-19 – 2019-10-20 (×2): 5 mg via ORAL
  Filled 2019-10-18 (×2): qty 1

## 2019-10-18 MED ORDER — ASPIRIN 81 MG PO CHEW
81.0000 mg | CHEWABLE_TABLET | Freq: Every day | ORAL | Status: DC
  Administered 2019-10-19 – 2019-10-20 (×2): 81 mg via ORAL
  Filled 2019-10-18 (×2): qty 1

## 2019-10-18 NOTE — ED Triage Notes (Signed)
First Nurse Note:  C/O left shoulder pain since last night.  Denies injury.  Patient is AAOx3.  Skin warm and dry. NAD.

## 2019-10-18 NOTE — ED Notes (Signed)
Pt provided drink and remote. Reminded on NPO status at midnight, pt understands and states he is watching the clock

## 2019-10-18 NOTE — ED Triage Notes (Signed)
Pt comes via POV from home with c/o SOB, left arm pain and some chest pain. Pt states this started the other night. Pt states he feels SOB.  Pt states some pain in jaw as well.

## 2019-10-18 NOTE — ED Notes (Signed)
Pt placed on 2L [place] due to pt oxygen saturation being in the mid-high 80s. Pt oxygen saturation improved to 97% on 2L [place].

## 2019-10-18 NOTE — H&P (Signed)
[place] at [place] NAME: Larry Terrell    MR#:  [id number]  DATE OF BIRTH:  Mar 08, 1961  DATE OF ADMISSION:  10/18/2019  PRIMARY CARE PHYSICIAN: [facility], [employer]   REQUESTING/REFERRING PHYSICIAN: Delman Kitten, MD  CHIEF COMPLAINT:   Chief Complaint  Patient presents with  . Chest Pain  . Arm Pain  . Shortness of Breath    HISTORY OF PRESENT ILLNESS:  Larry Terrell  is a 59 y.o. male with a known history of coronary artery disease status post MI and PCI with 2 stents, diastolic CHF, COPD, depression, diabetes mellitus and hypertension, presented to the emergency room with acute onset of midsternal chest pain felt as sharp pain which has been intermittent over the last couple of days with radiation to his left shoulder and occasional neck pain.  He admitted to dyspnea as well as palpitations with his pain.  He has been having occasional orthopnea but has not been more than his 2 pillow orthopnea.  No worsening lower extremity edema.  He admitted to dyspnea on exertion lately.  No nausea or vomiting or diaphoresis with this chest pain.  No recent travels or surgeries.  No recent exposure to COVID-19.  He admits to cough however states that it has been chronic for a long time.  No wheezing or hemoptysis.  Upon presentation to the emergency room, blood pressure was 162/93 and later 179/86 with pulse oximetry of 91 L 94% on room air and otherwise normal vital signs.  Labs revealed high-sensitivity troponin I of 37 and later 50 and his BNP came back 177.  Blood glucose was 366 and BUN of 19 with creatinine 1.47 compared to 22 and 1.32 on 06/07/2019.  Two-view chest x-ray showed pulmonary vascular congestion.  EKG showed Sinus rhythm with a rate of 80 with first-degree AV block and poor R wave progression with T wave inversion inferiorly and laterally.  The patient was given 4 baby aspirin.  He will be admitted to an observation progressive cardiac bed for further  evaluation and management.   PAST MEDICAL HISTORY:   Past Medical History:  Diagnosis Date  . CHF (congestive heart failure) (HCC)    diastolic (echo at [facility] 5625) EF 55-60%  . Cirrhosis ([place])    NASH per records  . COPD (chronic obstructive pulmonary disease) ([place])   . Depression   . Diabetes mellitus with neuropathy ([place])   . Heart attack ([place])   . Neuropathy   . OSA (obstructive sleep apnea)   . Peripheral vascular disease due to secondary diabetes mellitus ([place])   . Splenomegaly   . 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 TIA (transient ischemic attack) April 2016    PAST SURGICAL HISTORY:   Past Surgical History:  Procedure Laterality Date  . AORTA - FEMORAL ARTERY BYPASS GRAFT Bilateral   . CARDIAC CATHETERIZATION    . cardiac stents  July 2015   . CORONARY STENT INTERVENTION N/A 08/09/2017   Procedure: CORONARY STENT INTERVENTION;  Surgeon: Wellington Hampshire, MD;  Location: [facility];  Service: Cardiovascular;  Laterality: N/A;  . LAPAROSCOPIC APPENDECTOMY N/A 07/10/2015   Procedure: APPENDECTOMY LAPAROSCOPIC;  Surgeon: Rolm Bookbinder, MD;  Location: [facility];  Service: General;  Laterality: N/A;  . LEFT HEART CATH AND CORONARY ANGIOGRAPHY N/A 08/09/2017   Procedure: LEFT HEART CATH AND CORONARY ANGIOGRAPHY;  Surgeon: Wellington Hampshire, MD;  Location: [facility];  Service: Cardiovascular;  Laterality: N/A;  SOCIAL HISTORY:   Social History   Tobacco Use  . Smoking status: Former Smoker    Types: Cigarettes    Quit date: 2009    Years since quitting: 12.2  . Smokeless tobacco: Never Used  Substance Use Topics  . Alcohol use: No    Alcohol/week: 0.0 standard drinks    FAMILY HISTORY:   Family History  Problem Relation Age of Onset  . Heart attack Mother 21  . Stroke Mother   . Heart attack Father   . Alzheimer's disease Father     DRUG ALLERGIES:   Allergies  Allergen Reactions  . Gabapentin   . Ace Inhibitors Cough    Other  reaction(s): Other (See Comments) cough    REVIEW OF SYSTEMS:   ROS As per history of present illness. All pertinent systems were reviewed above. Constitutional,  HEENT, cardiovascular, respiratory, GI, GU, musculoskeletal, neuro, psychiatric, endocrine,  integumentary and hematologic systems were reviewed and are otherwise  negative/unremarkable except for positive findings mentioned above in the HPI.   MEDICATIONS AT HOME:   Prior to Admission medications   Medication Sig Start Date End Date Taking? Authorizing Provider  aspirin 81 MG chewable tablet Chew 1 tablet (81 mg total) by mouth daily. 08/11/17   Fritzi Mandes, MD  atorvastatin (LIPITOR) 40 MG tablet Take 40 mg by mouth daily.     [provider]  clopidogrel (PLAVIX) 75 MG tablet Take 1 tablet (75 mg total) by mouth daily with breakfast. 08/11/17   Fritzi Mandes, MD  famotidine (PEPCID) 20 MG tablet Take 1 tablet (20 mg total) by mouth 2 (two) times daily. Patient not taking: Reported on 06/04/2019 12/26/17   Paulette Blanch, MD  levETIRAcetam (KEPPRA) 250 MG tablet Take 250 mg by mouth 3 (three) times daily. 03/19/19   [provider]  losartan (COZAAR) 100 MG tablet Take 100 mg by mouth daily. 05/11/19   [provider]  metFORMIN (GLUCOPHAGE) 500 MG tablet Take 500 mg by mouth 2 (two) times daily with a meal.    [provider]  metoprolol succinate (TOPROL-XL) 100 MG 24 hr tablet Take 100 mg by mouth daily. 05/11/19   [provider]  nitroGLYCERIN (NITROSTAT) 0.4 MG SL tablet Place 0.4 mg under the tongue every 5 (five) minutes as needed for chest pain.    [provider]  omeprazole (PRILOSEC) 20 MG capsule Take 20 mg by mouth daily as needed for heartburn. 05/11/19   [provider]  oxyCODONE (OXY IR/ROXICODONE) 5 MG immediate release tablet Take 5 mg by mouth every 8 (eight) hours as needed for pain. 05/18/19   [provider]  pregabalin (LYRICA) 100 MG  capsule Take 100 mg by mouth 3 (three) times daily. 05/11/19   [provider]  pregabalin (LYRICA) 50 MG capsule Take 1 capsule (50 mg total) by mouth 3 (three) times daily. 03/22/19 03/21/20  Earleen Newport, MD  venlafaxine (EFFEXOR) 75 MG tablet Take 75 mg by mouth 3 (three) times daily. 05/11/19   [provider]      VITAL SIGNS:  Blood pressure (!) 179/86, pulse 75, temperature 98 F (36.7 C), resp. rate 20, height 5' 7"  (1.702 m), weight 89.4 kg, SpO2 94 %.  PHYSICAL EXAMINATION:  Physical Exam  GENERAL:  59 y.o.-year-old  Caucasian male patient lying in the bed with no acute distress.  EYES: Pupils equal, round, reactive to [place] and accommodation. No scleral icterus. Extraocular muscles intact.  HEENT: Head atraumatic, normocephalic.  Oropharynx and nasopharynx clear.  NECK:  Supple, no jugular venous distention. No thyroid enlargement, no tenderness.  LUNGS: Slightly diminished bibasal breath sounds with bibasal rales extending to midlung zone. CARDIOVASCULAR: Regular rate and rhythm, S1, S2 normal. No murmurs, rubs, or gallops.  ABDOMEN: Soft, nondistended, nontender. Bowel sounds present. No organomegaly or mass.  EXTREMITIES: No pedal edema, cyanosis, or clubbing.  NEUROLOGIC: Cranial nerves II through XII are intact. Muscle strength 5/5 in all extremities. Sensation intact. Gait not checked.  PSYCHIATRIC: The patient is alert and oriented x 3.  Normal affect and good eye contact. SKIN: No obvious rash, lesion, or ulcer.   LABORATORY PANEL:   CBC Recent Labs  Lab 10/18/19 1526  WBC 8.9  HGB 14.3  HCT 39.4  PLT 172   ------------------------------------------------------------------------------------------------------------------  Chemistries  Recent Labs  Lab 10/18/19 1526  NA 133*  K 4.6  CL 101  CO2 24  GLUCOSE 366*  BUN 19  CREATININE 1.47*  CALCIUM 9.0    ------------------------------------------------------------------------------------------------------------------  Cardiac Enzymes No results for input(s): TROPONINI in the last 168 hours. ------------------------------------------------------------------------------------------------------------------  RADIOLOGY:  DG Chest 2 View  Result Date: 10/18/2019 CLINICAL DATA:  Short of breath. Left arm pain and chest pain. EXAM: CHEST - 2 VIEW COMPARISON:  06/06/2019 FINDINGS: Normal heart size. No pleural effusion or edema. Pulmonary vascular congestion is identified bilaterally. Chronic interstitial coarsening is unchanged. No superimposed airspace consolidation. IMPRESSION: 1. Pulmonary vascular congestion. Electronically Signed   By: Kerby Moors M.D.   On: 10/18/2019 15:51      IMPRESSION AND PLAN:   1. Chest pain, rule out acute coronary syndrome.   -The patient will be admitted to an observation telemetry bed.   -Will follow serial cardiac enzymes and EKGs.   -We will obtain a cardiology consult in a.m. for further cardiac risk stratification.  I notified Dr. Curt Bears about the patient.  -The patient will be continued on aspirin and Plavix as well as p.r.n. sublingual nitroglycerin and will add morphine sulfate for pain. -We will continue beta-blocker therapy and statin therapy.  2.  Mild acute on chronic diastolic CHF. -The patient's last 2D echo was in September 2020 and revealed an EF of 55 to 30% with diastolic dysfunction. -He will be diuresed with IV Lasix. -We will follow serial troponin I as mentioned above. -Cardiology consult will be obtained.  3.   Mild acute kidney injury. -This is likely secondary to acute CHF. -We will follow BMP with diuresis. -We will hold off nephrotoxins.  4.  Type 2 diabetes mellitus, currently uncontrolled with hyperglycemia. -The patient will be placed on supplemental coverage with NovoLog. -We will hold Metformin for now.  5. Coronary  artery disease status post MI, status post PCI and 2 stents. -The patient will be continued on aspirin, Plavix, beta-blocker therapy a.m. statin therapy.  6.  Dyslipidemia. -Statin therapy will be continued and will check fasting lipids.  7.  Seizure disorder. -We will continue Keppra.  8.  Hypertension. -We will continue Norvasc and hold off Cozaar given acute kidney injury.  9.  DVT prophylaxis. -Subcutaneous Lovenox.   All the records are reviewed and case discussed with ED provider. The plan of care was discussed in details with the patient (and family). I answered all questions. The patient agreed to proceed with the above mentioned plan. Further management will depend upon hospital course.   CODE STATUS: Full code  TOTAL TIME TAKING CARE OF THIS PATIENT: 55 minutes.    Deardra Hinkley A Terrisa Curfman  M.D on 10/18/2019 at 8:54 PM  [facility]   From 7 PM-7 AM, contact night-coverage [email or web address]  CC: Primary care physician; [facility] [place] [facility]   Note: This dictation was prepared with Dragon dictation along with smaller phrase technology. Any transcriptional errors that result from this process are unintentional.

## 2019-10-18 NOTE — ED Provider Notes (Signed)
[facility] Emergency Department Provider Note   ____________________________________________   First MD Initiated Contact with Patient 10/18/19 2028     (approximate)  I have reviewed the triage vital signs and the nursing notes.   HISTORY  Chief Complaint Chest Pain, Arm Pain, and Shortness of Breath    HPI A 59 year old patient with a history of CVA, treated diabetes, hypertension and obesity presents for evaluation of chest pain. Initial onset of pain was more than 6 hours ago. The patient's chest pain is described as heaviness/pressure/tightness and is not worse with exertion. The patient's chest pain is middle- or left-sided, is not well-localized, is not sharp and does radiate to the arms/jaw/neck. The patient does not complain of nausea and denies diaphoresis. The patient has no history of peripheral artery disease, has not smoked in the past 90 days, has no relevant family history of coronary artery disease (first degree relative at less than age 59) and has no history of hypercholesterolemia.   Past Medical History:  Diagnosis Date  . CHF (congestive heart failure) (HCC)    diastolic (echo at [facility] 2025) EF 55-60%  . Cirrhosis ([place])    NASH per records  . COPD (chronic obstructive pulmonary disease) ([place])   . Depression   . Diabetes mellitus with neuropathy ([place])   . Heart attack ([place])   . Neuropathy   . OSA (obstructive sleep apnea)   . Peripheral vascular disease due to secondary diabetes mellitus ([place])   . Splenomegaly   . 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 TIA (transient ischemic attack) April 2016    Patient Active Problem List   Diagnosis Date Noted  . Altered mental status 06/04/2019  . Sepsis ([place]) 06/04/2019  . AKI (acute kidney injury) ([place]) 06/04/2019  . GERD (gastroesophageal reflux disease) 06/04/2019  . History of seizure 06/04/2019  . Stroke ([place]) 04/01/2019  . NSTEMI (non-ST elevated myocardial infarction) ([place])  08/06/2017  . Acute appendicitis 07/10/2015  . S/P laparoscopic appendectomy 07/10/2015  . Appendicitis, acute   . Diabetes mellitus with complication ([place])   . Chronic congestive heart failure with left ventricular diastolic dysfunction ([place])   . Lactic acidosis   . Acute respiratory failure with hypoxia [facility])     Past Surgical History:  Procedure Laterality Date  . AORTA - FEMORAL ARTERY BYPASS GRAFT Bilateral   . CARDIAC CATHETERIZATION    . cardiac stents  July 2015   . CORONARY STENT INTERVENTION N/A 08/09/2017   Procedure: CORONARY STENT INTERVENTION;  Surgeon: Wellington Hampshire, MD;  Location: [place] [facility];  Service: Cardiovascular;  Laterality: N/A;  . LAPAROSCOPIC APPENDECTOMY N/A 07/10/2015   Procedure: APPENDECTOMY LAPAROSCOPIC;  Surgeon: Rolm Bookbinder, MD;  Location: [place];  Service: General;  Laterality: N/A;  . LEFT HEART CATH AND CORONARY ANGIOGRAPHY N/A 08/09/2017   Procedure: LEFT HEART CATH AND CORONARY ANGIOGRAPHY;  Surgeon: Wellington Hampshire, MD;  Location: [place] [facility];  Service: Cardiovascular;  Laterality: N/A;    Prior to Admission medications   Medication Sig Start Date End Date Taking? Authorizing Provider  aspirin 81 MG chewable tablet Chew 1 tablet (81 mg total) by mouth daily. 08/11/17   Fritzi Mandes, MD  atorvastatin (LIPITOR) 40 MG tablet Take 40 mg by mouth daily.     [provider]  clopidogrel (PLAVIX) 75 MG tablet Take 1 tablet (75 mg total) by mouth daily with breakfast. 08/11/17   Fritzi Mandes, MD  famotidine (PEPCID) 20 MG tablet  Take 1 tablet (20 mg total) by mouth 2 (two) times daily. Patient not taking: Reported on 06/04/2019 12/26/17   Paulette Blanch, MD  levETIRAcetam (KEPPRA) 250 MG tablet Take 250 mg by mouth 3 (three) times daily. 03/19/19   [provider]  losartan (COZAAR) 100 MG tablet Take 100 mg by mouth daily. 05/11/19   [provider]  metFORMIN (GLUCOPHAGE) 500 MG tablet Take 500 mg by mouth 2  (two) times daily with a meal.    [provider]  metoprolol succinate (TOPROL-XL) 100 MG 24 hr tablet Take 100 mg by mouth daily. 05/11/19   [provider]  nitroGLYCERIN (NITROSTAT) 0.4 MG SL tablet Place 0.4 mg under the tongue every 5 (five) minutes as needed for chest pain.    [provider]  omeprazole (PRILOSEC) 20 MG capsule Take 20 mg by mouth daily as needed for heartburn. 05/11/19   [provider]  oxyCODONE (OXY IR/ROXICODONE) 5 MG immediate release tablet Take 5 mg by mouth every 8 (eight) hours as needed for pain. 05/18/19   [provider]  pregabalin (LYRICA) 100 MG capsule Take 100 mg by mouth 3 (three) times daily. 05/11/19   [provider]  pregabalin (LYRICA) 50 MG capsule Take 1 capsule (50 mg total) by mouth 3 (three) times daily. 03/22/19 03/21/20  Earleen Newport, MD  venlafaxine (EFFEXOR) 75 MG tablet Take 75 mg by mouth 3 (three) times daily. 05/11/19   [provider]    Allergies Gabapentin and Ace inhibitors  Family History  Problem Relation Age of Onset  . Heart attack Mother 64  . Stroke Mother   . Heart attack Father   . Alzheimer's disease Father     Social History Social History   Tobacco Use  . Smoking status: Former Smoker    Types: Cigarettes    Quit date: 2009    Years since quitting: 12.2  . Smokeless tobacco: Never Used  Substance Use Topics  . Alcohol use: No    Alcohol/week: 0.0 standard drinks  . Drug use: No    Review of Systems Constitutional: No fever/chills Eyes: No visual changes. ENT: No sore throat. Cardiovascular: See HPI Respiratory: Feels a little short of breath at times Gastrointestinal: No abdominal pain.   Genitourinary: Negative for dysuria. Musculoskeletal: Negative for back pain. Skin: Negative for rash. Neurological: Negative for headaches, areas of focal weakness or numbness.  Patient reports at the present time his pain has gone away  completely. ____________________________________________   PHYSICAL EXAM:  VITAL SIGNS: ED Triage Vitals [10/18/19 1523]  Enc Vitals Group     BP (!) 162/93     Pulse Rate 75     Resp (!) 22     Temp 98 F (36.7 C)     Temp src      SpO2 91 %     Weight 197 lb (89.4 kg)     Height 5' 7"  (1.702 m)     Head Circumference      Peak Flow      Pain Score 5     Pain Loc      Pain Edu?      Excl. in [place]?     Constitutional: Alert and oriented. Well appearing and in no acute distress. Eyes: Conjunctivae are normal. Head: Atraumatic. Nose: No congestion/rhinnorhea. Mouth/Throat: Mucous membranes are moist. Neck: No stridor.  Cardiovascular: Normal rate, regular rhythm. Grossly normal heart sounds.  Good peripheral circulation. Respiratory: Normal respiratory effort.  No retractions. Lungs CTAB. Gastrointestinal: Soft and nontender. No distention. Musculoskeletal: No lower extremity tenderness nor edema. Neurologic:  Normal speech and language. No gross focal neurologic deficits are appreciated.  Skin:  Skin is warm, dry and intact. No rash noted. Psychiatric: Mood and affect are normal. Speech and behavior are normal.  ____________________________________________   LABS (all labs ordered are listed, but only abnormal results are displayed)  Labs Reviewed  BASIC METABOLIC PANEL - Abnormal; Notable for the following components:      Result Value   Sodium 133 (*)    Glucose, Bld 366 (*)    Creatinine, Ser 1.47 (*)    GFR calc non Af Amer 51 (*)    GFR calc Af Amer 59 (*)    All other components within normal limits  CBC - Abnormal; Notable for the following components:   MCHC 36.3 (*)    All other components within normal limits  TROPONIN I (HIGH SENSITIVITY) - Abnormal; Notable for the following components:   Troponin I (High Sensitivity) 37 (*)    All other components within normal limits  TROPONIN I (HIGH SENSITIVITY) - Abnormal; Notable for the following components:    Troponin I (High Sensitivity) 50 (*)    All other components within normal limits  SARS CORONAVIRUS 2 (TAT 6-24 HRS)  BRAIN NATRIURETIC PEPTIDE   ____________________________________________  EKG  Reviewed inter by me at 1600 Heart rate 80 QRS 80 QTc 400 Normal sinus rhythm, left axis deviation, probable old inferior MI, somewhat poor R wave progression across the anteroseptal precordial leads.  Minimal T wave inversion noted in V6.  No STEMI  EKG demonstrates slight change with minimal inversion in V6, question if this is secondary to lead positioning no. ____________________________________________  RADIOLOGY  DG Chest 2 View  Result Date: 10/18/2019 CLINICAL DATA:  Short of breath. Left arm pain and chest pain. EXAM: CHEST - 2 VIEW COMPARISON:  06/06/2019 FINDINGS: Normal heart size. No pleural effusion or edema. Pulmonary vascular congestion is identified bilaterally. Chronic interstitial coarsening is unchanged. No superimposed airspace consolidation. IMPRESSION: 1. Pulmonary vascular congestion. Electronically Signed   By: Kerby Moors M.D.   On: 10/18/2019 15:51    Chest x-ray reviewed with vascular congestion noted ____________________________________________   PROCEDURES  Procedure(s) performed: None  Procedures  Critical Care performed: No  ____________________________________________   INITIAL IMPRESSION / ASSESSMENT AND PLAN / ED COURSE  Pertinent labs & imaging results that were available during my care of the patient were reviewed by me and considered in my medical decision making (see chart for details).   Differential diagnosis includes, but is not limited to, ACS, aortic dissection, pulmonary embolism, cardiac tamponade, pneumothorax, pneumonia, pericarditis, myocarditis, GI-related causes including esophagitis/gastritis, and musculoskeletal chest wall pain.    HEAR Score: 5    ----------------------------------------- 8:41 PM on  10/18/2019 -----------------------------------------  Based on the patient's elevated risk for coronary disease and elevating troponins with increasing delta in the ER, will admit for observation and further chest pain work-up to the hospitalist service.  Discussed with the patient he is agreeable understanding with plan.  Currently pain-free   Admission discussed with Dr. Sidney Ace  ____________________________________________   FINAL CLINICAL IMPRESSION(S) / ED DIAGNOSES  Final diagnoses:  Chest pain, moderate coronary artery risk        Note:  This document was prepared using Dragon voice recognition software and may include unintentional dictation errors       Delman Kitten, MD 10/18/19 2055

## 2019-10-19 ENCOUNTER — Encounter
Admission: EM | Disposition: A | Discharge status: Other Acute Inpatient Hospital | Payer: Self-pay | Source: Home / Self Care | Attending: Internal Medicine

## 2019-10-19 ENCOUNTER — Other Ambulatory Visit: Payer: Self-pay

## 2019-10-19 DIAGNOSIS — I130 Hypertensive heart and chronic kidney disease with heart failure and stage 1 through stage 4 chronic kidney disease, or unspecified chronic kidney disease: Secondary | ICD-10-CM | POA: Diagnosis present

## 2019-10-19 DIAGNOSIS — I440 Atrioventricular block, first degree: Secondary | ICD-10-CM | POA: Diagnosis present

## 2019-10-19 DIAGNOSIS — M316 Other giant cell arteritis: Secondary | ICD-10-CM | POA: Diagnosis present

## 2019-10-19 DIAGNOSIS — E1151 Type 2 diabetes mellitus with diabetic peripheral angiopathy without gangrene: Secondary | ICD-10-CM | POA: Diagnosis present

## 2019-10-19 DIAGNOSIS — I200 Unstable angina: Secondary | ICD-10-CM

## 2019-10-19 DIAGNOSIS — N179 Acute kidney failure, unspecified: Secondary | ICD-10-CM | POA: Diagnosis not present

## 2019-10-19 DIAGNOSIS — G4733 Obstructive sleep apnea (adult) (pediatric): Secondary | ICD-10-CM | POA: Diagnosis present

## 2019-10-19 DIAGNOSIS — J9602 Acute respiratory failure with hypercapnia: Secondary | ICD-10-CM | POA: Diagnosis not present

## 2019-10-19 DIAGNOSIS — Z820 Family history of epilepsy and other diseases of the nervous system: Secondary | ICD-10-CM | POA: Diagnosis not present

## 2019-10-19 DIAGNOSIS — J9601 Acute respiratory failure with hypoxia: Secondary | ICD-10-CM | POA: Diagnosis not present

## 2019-10-19 DIAGNOSIS — I25110 Atherosclerotic heart disease of native coronary artery with unstable angina pectoris: Secondary | ICD-10-CM | POA: Diagnosis present

## 2019-10-19 DIAGNOSIS — I739 Peripheral vascular disease, unspecified: Secondary | ICD-10-CM | POA: Diagnosis not present

## 2019-10-19 DIAGNOSIS — Z955 Presence of coronary angioplasty implant and graft: Secondary | ICD-10-CM

## 2019-10-19 DIAGNOSIS — E1140 Type 2 diabetes mellitus with diabetic neuropathy, unspecified: Secondary | ICD-10-CM | POA: Diagnosis present

## 2019-10-19 DIAGNOSIS — R197 Diarrhea, unspecified: Secondary | ICD-10-CM | POA: Diagnosis present

## 2019-10-19 DIAGNOSIS — I5031 Acute diastolic (congestive) heart failure: Secondary | ICD-10-CM | POA: Diagnosis not present

## 2019-10-19 DIAGNOSIS — Z6830 Body mass index (BMI) 30.0-30.9, adult: Secondary | ICD-10-CM | POA: Diagnosis not present

## 2019-10-19 DIAGNOSIS — Z20822 Contact with and (suspected) exposure to covid-19: Secondary | ICD-10-CM | POA: Diagnosis present

## 2019-10-19 DIAGNOSIS — G40909 Epilepsy, unspecified, not intractable, without status epilepticus: Secondary | ICD-10-CM | POA: Diagnosis present

## 2019-10-19 DIAGNOSIS — E785 Hyperlipidemia, unspecified: Secondary | ICD-10-CM | POA: Diagnosis not present

## 2019-10-19 DIAGNOSIS — J95821 Acute postprocedural respiratory failure: Secondary | ICD-10-CM | POA: Diagnosis not present

## 2019-10-19 DIAGNOSIS — I10 Essential (primary) hypertension: Secondary | ICD-10-CM | POA: Diagnosis not present

## 2019-10-19 DIAGNOSIS — I5033 Acute on chronic diastolic (congestive) heart failure: Secondary | ICD-10-CM | POA: Diagnosis not present

## 2019-10-19 DIAGNOSIS — F329 Major depressive disorder, single episode, unspecified: Secondary | ICD-10-CM | POA: Diagnosis present

## 2019-10-19 DIAGNOSIS — E669 Obesity, unspecified: Secondary | ICD-10-CM | POA: Diagnosis present

## 2019-10-19 DIAGNOSIS — Y831 Surgical operation with implant of artificial internal device as the cause of abnormal reaction of the patient, or of later complication, without mention of misadventure at the time of the procedure: Secondary | ICD-10-CM | POA: Diagnosis present

## 2019-10-19 DIAGNOSIS — N189 Chronic kidney disease, unspecified: Secondary | ICD-10-CM | POA: Diagnosis not present

## 2019-10-19 DIAGNOSIS — Z951 Presence of aortocoronary bypass graft: Secondary | ICD-10-CM | POA: Diagnosis not present

## 2019-10-19 DIAGNOSIS — I214 Non-ST elevation (NSTEMI) myocardial infarction: Secondary | ICD-10-CM | POA: Diagnosis present

## 2019-10-19 DIAGNOSIS — T82855A Stenosis of coronary artery stent, initial encounter: Secondary | ICD-10-CM | POA: Diagnosis present

## 2019-10-19 DIAGNOSIS — I252 Old myocardial infarction: Secondary | ICD-10-CM | POA: Diagnosis not present

## 2019-10-19 DIAGNOSIS — E1165 Type 2 diabetes mellitus with hyperglycemia: Secondary | ICD-10-CM | POA: Diagnosis present

## 2019-10-19 DIAGNOSIS — E1122 Type 2 diabetes mellitus with diabetic chronic kidney disease: Secondary | ICD-10-CM | POA: Diagnosis present

## 2019-10-19 DIAGNOSIS — J449 Chronic obstructive pulmonary disease, unspecified: Secondary | ICD-10-CM | POA: Diagnosis present

## 2019-10-19 HISTORY — PX: LEFT HEART CATH AND CORONARY ANGIOGRAPHY: CATH118249

## 2019-10-19 LAB — LIPID PANEL
Cholesterol: 160 mg/dL (ref 0–200)
HDL: 23 mg/dL — ABNORMAL LOW (ref >40)
LDL Cholesterol: UNDETERMINED mg/dL (ref 0–99)
Total CHOL/HDL Ratio: 7 RATIO
Triglycerides: 482 mg/dL — ABNORMAL HIGH (ref <150)
VLDL: UNDETERMINED mg/dL (ref 0–40)

## 2019-10-19 LAB — SARS CORONAVIRUS 2 (TAT 6-24 HRS): SARS Coronavirus 2: NEGATIVE

## 2019-10-19 LAB — APTT: aPTT: 36 seconds (ref 24–36)

## 2019-10-19 LAB — GLUCOSE, CAPILLARY
Glucose-Capillary: 232 mg/dL — ABNORMAL HIGH (ref 70–99)
Glucose-Capillary: 233 mg/dL — ABNORMAL HIGH (ref 70–99)
Glucose-Capillary: 327 mg/dL — ABNORMAL HIGH (ref 70–99)
Glucose-Capillary: 392 mg/dL — ABNORMAL HIGH (ref 70–99)

## 2019-10-19 LAB — LDL CHOLESTEROL, DIRECT: Direct LDL: 72 mg/dL (ref 0–99)

## 2019-10-19 LAB — PROTIME-INR
INR: 1 (ref 0.8–1.2)
Prothrombin Time: 13.3 seconds (ref 11.4–15.2)

## 2019-10-19 LAB — TROPONIN I (HIGH SENSITIVITY): Troponin I (High Sensitivity): 33 ng/L — ABNORMAL HIGH (ref <18)

## 2019-10-19 SURGERY — LEFT HEART CATH AND CORONARY ANGIOGRAPHY
Anesthesia: Moderate Sedation

## 2019-10-19 MED ORDER — HEPARIN BOLUS VIA INFUSION
4000.0000 [IU] | Freq: Once | INTRAVENOUS | Status: DC
  Filled 2019-10-19: qty 4000

## 2019-10-19 MED ORDER — FENTANYL CITRATE (PF) 100 MCG/2ML IJ SOLN
INTRAMUSCULAR | Status: DC | PRN
  Administered 2019-10-19 (×2): 25 ug via INTRAVENOUS

## 2019-10-19 MED ORDER — SODIUM CHLORIDE 0.9% FLUSH: 3.0000 mL | INTRAVENOUS | Status: DC | PRN

## 2019-10-19 MED ORDER — MIDAZOLAM HCL 2 MG/2ML IJ SOLN
INTRAMUSCULAR | Status: DC | PRN
  Administered 2019-10-19 (×2): 1 mg via INTRAVENOUS

## 2019-10-19 MED ORDER — SODIUM CHLORIDE 0.9% FLUSH: 3.0000 mL | Freq: Two times a day (BID) | INTRAVENOUS | Status: DC

## 2019-10-19 MED ORDER — SODIUM CHLORIDE 0.9 % WEIGHT BASED INFUSION
3.0000 mL/kg/h | INTRAVENOUS | Status: DC
  Administered 2019-10-19: 15:00:00 1 mL/kg/h via INTRAVENOUS

## 2019-10-19 MED ORDER — HEPARIN (PORCINE) IN NACL 1000-0.9 UT/500ML-% IV SOLN
INTRAVENOUS | Status: AC
  Filled 2019-10-19: qty 1000

## 2019-10-19 MED ORDER — OXYCODONE HCL 5 MG PO TABS
ORAL_TABLET | ORAL | Status: AC
  Administered 2019-10-19: 5 mg via ORAL
  Filled 2019-10-19: qty 1

## 2019-10-19 MED ORDER — MIDAZOLAM HCL 2 MG/2ML IJ SOLN
INTRAMUSCULAR | Status: AC
  Filled 2019-10-19: qty 2

## 2019-10-19 MED ORDER — SODIUM CHLORIDE 0.9 % WEIGHT BASED INFUSION: 1.0000 mL/kg/h | INTRAVENOUS | Status: DC

## 2019-10-19 MED ORDER — IOHEXOL 300 MG/ML  SOLN
INTRAMUSCULAR | Status: DC | PRN
  Administered 2019-10-19: 16:00:00 100 mL

## 2019-10-19 MED ORDER — FENTANYL CITRATE (PF) 100 MCG/2ML IJ SOLN
INTRAMUSCULAR | Status: AC
  Filled 2019-10-19: qty 2

## 2019-10-19 MED ORDER — HEPARIN (PORCINE) 25000 UT/250ML-% IV SOLN
1150.0000 [IU]/h | INTRAVENOUS | Status: DC
  Administered 2019-10-20: 01:00:00 1150 [IU]/h via INTRAVENOUS
  Filled 2019-10-19: qty 250

## 2019-10-19 MED ORDER — SODIUM CHLORIDE 0.9 % IV SOLN: 250.0000 mL | INTRAVENOUS | Status: DC | PRN

## 2019-10-19 MED ORDER — LEVETIRACETAM 500 MG PO TABS
500.0000 mg | ORAL_TABLET | Freq: Three times a day (TID) | ORAL | Status: DC
  Administered 2019-10-19 – 2019-10-20 (×4): 500 mg via ORAL
  Filled 2019-10-19 (×4): qty 1

## 2019-10-19 MED ORDER — HEPARIN (PORCINE) IN NACL 1000-0.9 UT/500ML-% IV SOLN
INTRAVENOUS | Status: DC | PRN
  Administered 2019-10-19: 500 mL

## 2019-10-19 MED ORDER — HEPARIN (PORCINE) 25000 UT/250ML-% IV SOLN: 1150.0000 [IU]/h | INTRAVENOUS | Status: DC

## 2019-10-19 SURGICAL SUPPLY — 11 items
CATH INFINITI 5FR ANG PIGTAIL (CATHETERS) ×3 IMPLANT
CATH INFINITI 5FR JL4 (CATHETERS) ×3 IMPLANT
CATH INFINITI JR4 5F (CATHETERS) ×3 IMPLANT
DEVICE CLOSURE MYNXGRIP 5F (Vascular Products) ×3 IMPLANT
KIT MANI 3VAL PERCEP (MISCELLANEOUS) ×3 IMPLANT
NEEDLE PERC 18GX7CM (NEEDLE) ×3 IMPLANT
NEEDLE SMART REG 18GX2-3/4 (NEEDLE) ×3 IMPLANT
PACK CARDIAC CATH (CUSTOM PROCEDURE TRAY) ×3 IMPLANT
SHEATH AVANTI 5FR X 11CM (SHEATH) ×3 IMPLANT
WIRE GUIDERIGHT .035X150 (WIRE) ×3 IMPLANT
WIRE HITORQ VERSACORE ST 145CM (WIRE) ×3 IMPLANT

## 2019-10-19 NOTE — Progress Notes (Addendum)
PROGRESS NOTE    Larry Terrell   PNT:[id number]  DOB: Oct 04, 1960  PCP: [facility] [employer]    DOA: 10/18/2019 LOS: 0   Brief Narrative   Larry Terrell  is a 59 y.o. male with a history of coronary artery disease status post MI and PCI with 2 stents, diastolic CHF, COPD, depression, diabetes mellitus and hypertension, presented to the ED on 10/18/19 with midsternal chest pain, intermittent over couple of days, radiating to the left shoulder.  Associated symptoms of dyspnea, palpitations, occasional orthopnea.  In the ED, hypertensive 162/93 --> 179/86, pulse oximetry 94% on room air and otherwise normal vital signs.  Labs notable for high-sensitivity troponin 37 >> 50.  BNP 177.  Blood glucose was 366 and BUN of 19 with creatinine 1.47 (from 22 and 1.32 on 06/07/2019).  Two-view chest x-ray showed pulmonary vascular congestion.  EKG showed normal sinus rhythm at 80 bpm with first-degree AV block and poor R wave progression, andT wave inversions inferiorly and laterally. The patient was given 4 baby aspirin and admitted to progressive cardiac unit.  Cardiology consulted.  Cardiac cath on 4/8 showed multivessel disease.  Plan is transfer to [facility] tomorrow AM (given late case today) for CABG.  Assessment & Plan   Active Problems:   NSTEMI (non-ST elevated myocardial infarction) (HCC)   Chest pain  Chest pain secondary to NSTEMI Coronary artery disease status post MI, status post PCI and 2 stents. Presented with chest pain, mildly elevated troponins, T wave inversions on EKG in the inferior lateral leads.  Underwent cardiac cath 4/8 which showed multivessel coronary disease requiring CABG. --Cardiology following, patient to be transferred to [facility] tomorrow morning --Continue heparin drip --Dual antiplatelet therapy with aspirin Plavix, hold for surgery --Sublingual nitro as needed --Continue beta-blocker and statin  Mild acute on chronic diastolic CHF. Prior 2D echo from  September 2020 showed EF of 55 to 08% with diastolic dysfunction. --Lasix 40 mg IV twice daily --Strict I's and O's and daily weights --Monitor renal function and electrolytes  Mild acute kidney injury -most likely due to acute CHF and poor renal perfusion. --Monitor BMP daily --Avoid nephrotoxins and hypotension --Renally dose meds as indicated  Type 2 diabetes mellitus, currently uncontrolled with hyperglycemia. --Hold Metformin --Sliding scale NovoLog  Dyslipidemia. --Continue statin  Seizure disorder. --Continue Keppra  Hypertension. --Hold losartan given AKI -- continue amlodipine   Patient BMI: Body mass index is 30.87 kg/m.   DVT prophylaxis: On heparin drip  Diet:  Diet Orders (From admission, onward)    Start     Ordered   10/19/19 1325  Diet NPO time specified Except for: Sips with Meds  Diet effective now    Question:  Except for  Answer:  Ferrel Logan with Meds   10/19/19 1324            Code Status: Full Code    Subjective 10/19/19    Patient seen examined and in the ED on hold for bed.  Had not yet been seen by cardiology.  No acute events reported since admission.  He currently denies chest pain but does report left shoulder pain.  States he has chronic intermittent diarrhea.  He has no other acute complaints and denies fevers or chills, nausea or vomiting, shortness of breath or other complaints   Disposition Plan & Communication   Dispo & Barriers: Transferring to [facility] tomorrow for CABG. Coming from: Home Exp d/c date: TBD Medically stable for d/c?  No  Severity of Illness: The appropriate patient status for this patient is INPATIENT. It is not anticipated that the patient will be medically stable for discharge from the hospital within 2 midnights of admission.  The following factors support the patient status of inpatient: --Presenting symptoms include chest pain radiating to shoulder, dyspnea, orthopnea. --Initial radiographic and  laboratory data are worrisome because of ECG changes and elevated troponin, left heart cath showing multivessel obstructive coronary diseasea. --Chronic co-morbidities include coronary artery disease with 2 prior stents, seizures, diastolic CHF, COPD, depression, type 2 diabetes, hypertension. Further evaluation and management in hospital, as outlined above, is warranted because he requires ongoing management of ACS, including heparin gtt and eventual CABG at [facility].   Family Communication: None at bedside, will attempt to call   Consults, Procedures, Significant Events   Consultants:   Cardiology  Procedures:   Cardiac cath 4/8 -multivessel obstructive coronary disease  Antimicrobials:   None   Objective   Vitals:   10/19/19 1300 10/19/19 1435 10/19/19 1507 10/19/19 1615  BP: (!) 156/78 (!) 151/67  (!) 160/74  Pulse: 65 63  68  Resp: 15 14  14   Temp:  97.9 F (36.6 C)    TempSrc:  Oral    SpO2: (!) 89% 95% 92% 97%  Weight:  89.4 kg    Height:  5' 7"  (1.702 m)      Intake/Output Summary (Last 24 hours) at 10/19/2019 1630 Last data filed at 10/19/2019 1200 Gross per 24 hour  Intake --  Output 600 ml  Net -600 ml   Filed Weights   10/18/19 1523 10/19/19 1435  Weight: 89.4 kg 89.4 kg    Physical Exam:  General exam: awake, alert, no acute distress HEENT: moist mucus membranes, hearing grossly normal  Respiratory system: CTAB, no wheezes, rales or rhonchi, normal respiratory effort. Cardiovascular system: normal S1/S2, RRR, no pedal edema.   Central nervous system: A&O x3. no gross focal neurologic deficits Extremities: moves all, no cyanosis, normal tone Skin: dry, intact, normal temperature, normal color Psychiatry: normal mood, congruent affect  Labs   Data Reviewed: I have personally reviewed following labs and imaging studies  CBC: Recent Labs  Lab 10/18/19 1526  WBC 8.9  HGB 14.3  HCT 39.4  MCV 91.6  PLT 132   Basic Metabolic Panel: Recent  Labs  Lab 10/18/19 1526  NA 133*  K 4.6  CL 101  CO2 24  GLUCOSE 366*  BUN 19  CREATININE 1.47*  CALCIUM 9.0   GFR: Estimated Creatinine Clearance: 56.3 mL/min (A) (by C-G formula based on SCr of 1.47 mg/dL (H)). Liver Function Tests: No results for input(s): AST, ALT, ALKPHOS, BILITOT, PROT, ALBUMIN in the last 168 hours. No results for input(s): LIPASE, AMYLASE in the last 168 hours. No results for input(s): AMMONIA in the last 168 hours. Coagulation Profile: No results for input(s): INR, PROTIME in the last 168 hours. Cardiac Enzymes: No results for input(s): CKTOTAL, CKMB, CKMBINDEX, TROPONINI in the last 168 hours. BNP (last 3 results) No results for input(s): PROBNP in the last 8760 hours. HbA1C: No results for input(s): HGBA1C in the last 72 hours. CBG: Recent Labs  Lab 10/18/19 2258 10/19/19 1124 10/19/19 1453  GLUCAP 246* 232* 233*   Lipid Profile: Recent Labs    10/19/19 0418  CHOL 160  HDL 23*  LDLCALC UNABLE TO CALCULATE IF TRIGLYCERIDE OVER 400 mg/dL  TRIG 482*  CHOLHDL 7.0  LDLDIRECT 72.0   Thyroid Function Tests: No results for  input(s): TSH, T4TOTAL, FREET4, T3FREE, THYROIDAB in the last 72 hours. Anemia Panel: No results for input(s): VITAMINB12, FOLATE, FERRITIN, TIBC, IRON, RETICCTPCT in the last 72 hours. Sepsis Labs: No results for input(s): PROCALCITON, LATICACIDVEN in the last 168 hours.  Recent Results (from the past 240 hour(s))  SARS CORONAVIRUS 2 (TAT 6-24 HRS) Nasopharyngeal Nasopharyngeal Swab     Status: None   Collection Time: 10/18/19  8:55 PM   Specimen: Nasopharyngeal Swab  Result Value Ref Range Status   SARS Coronavirus 2 NEGATIVE NEGATIVE Final    Comment: (NOTE) SARS-CoV-2 target nucleic acids are NOT DETECTED. The SARS-CoV-2 RNA is generally detectable in upper and lower respiratory specimens during the acute phase of infection. Negative results do not preclude SARS-CoV-2 infection, do not rule out co-infections  with other pathogens, and should not be used as the sole basis for treatment or other patient management decisions. Negative results must be combined with clinical observations, patient history, and epidemiological information. The expected result is Negative. Fact Sheet for Patients: SugarRoll.be Fact Sheet for Healthcare Providers: https://www.woods-mathews.com/ This test is not yet approved or cleared by the [place] FDA and  has been authorized for detection and/or diagnosis of SARS-CoV-2 by FDA under an Emergency Use Authorization (EUA). This EUA will remain  in effect (meaning this test can be used) for the duration of the COVID-19 declaration under Section 56 4(b)(1) of the Act, 21 U.S.C. section 360bbb-3(b)(1), unless the authorization is terminated or revoked sooner. Performed at [facility], [place] [address], [place], [place] [address]       Imaging Studies   DG Chest 2 View  Result Date: 10/18/2019 CLINICAL DATA:  Short of breath. Left arm pain and chest pain. EXAM: CHEST - 2 VIEW COMPARISON:  06/06/2019 FINDINGS: Normal heart size. No pleural effusion or edema. Pulmonary vascular congestion is identified bilaterally. Chronic interstitial coarsening is unchanged. No superimposed airspace consolidation. IMPRESSION: 1. Pulmonary vascular congestion. Electronically Signed   By: Kerby Moors M.D.   On: 10/18/2019 15:51   CARDIAC CATHETERIZATION  Result Date: 10/19/2019  Balloon angioplasty was performed.  Previously placed Mid Cx to Dist Cx drug eluting stent is widely patent.  Balloon angioplasty was performed.  Mid RCA lesion is 80% stenosed.  Mid RCA to Dist RCA lesion is 10% stenosed.  Prox Cx lesion is 100% stenosed.  Ost Ramus lesion is 80% stenosed.  Prox LAD lesion is 95% stenosed.  Dist LAD lesion is 45% stenosed.  Mid LM lesion is 30% stenosed.  Ost RPDA lesion is 80% stenosed.  The left ventricular ejection  fraction is 50-55% by visual estimate.  LV end diastolic pressure is mildly elevated.  There is mild left ventricular systolic dysfunction.      Medications   Scheduled Meds: . [MAR Hold] amLODipine  5 mg Oral Daily  . [MAR Hold] aspirin  81 mg Oral Daily  . [MAR Hold] atorvastatin  40 mg Oral Daily  . [MAR Hold] clopidogrel  75 mg Oral Q breakfast  . [MAR Hold] furosemide  40 mg Intravenous Q12H  . [MAR Hold] heparin  4,000 Units Intravenous Once  . [MAR Hold] insulin aspart  0-15 Units Subcutaneous TID PC & HS  . [MAR Hold] levETIRAcetam  500 mg Oral TID  . [MAR Hold] metoprolol succinate  100 mg Oral Daily  . [MAR Hold] pantoprazole  40 mg Oral Daily  . [MAR Hold] pregabalin  50 mg Oral TID  . [MAR Hold] sodium chloride flush  3 mL Intravenous Once  . sodium chloride flush  3 mL Intravenous Q12H  . [MAR Hold] venlafaxine  75 mg Oral TID   Continuous Infusions: . sodium chloride    . [START ON 10/20/2019] sodium chloride 1 mL/kg/hr (10/19/19 1448)   Followed by  . [START ON 10/20/2019] sodium chloride    . heparin         LOS: 0 days    Time spent: 35 minutes    Ezekiel Slocumb, DO [facility]   If 7PM-7AM, please contact night-coverage [email or web address] 10/19/2019, 4:30 PM

## 2019-10-19 NOTE — Progress Notes (Signed)
    LHC showed multivessel CAD with recommendation for patient to transfer to [facility] on 4/9 given late case. Pharmacy consult has been placed for the patient to be resumed on heparin gtt at 00:01 on 10/20/2019. Cardiology will arrange transport in the morning.

## 2019-10-19 NOTE — Consult Note (Signed)
[place] for Heparin Indication: chest pain/ACS  Allergies  Allergen Reactions  . Gabapentin   . Ace Inhibitors Cough    Other reaction(s): Other (See Comments) cough    Patient Measurements: Height: 5' 7"  (170.2 cm) Weight: 89.4 kg (197 lb) IBW/kg (Calculated) : 66.1 Heparin Dosing Weight: 84.6 kg  Vital Signs: BP: 183/145 (04/08 1200) Pulse Rate: 60 (04/08 1215)  Labs: Recent Labs    10/18/19 1526 10/18/19 1832 10/19/19 0418  HGB 14.3  --   --   HCT 39.4  --   --   PLT 172  --   --   CREATININE 1.47*  --   --   TROPONINIHS 37* 50* 33*    Estimated Creatinine Clearance: 56.3 mL/min (A) (by C-G formula based on SCr of 1.47 mg/dL (H)).   Medical History: Past Medical History:  Diagnosis Date  . CHF (congestive heart failure) (HCC)    diastolic (echo at [facility] 2080) EF 55-60%  . Cirrhosis ([place])    NASH per records  . COPD (chronic obstructive pulmonary disease) ([place])   . Depression   . Diabetes mellitus with neuropathy ([place])   . Heart attack ([place])   . Neuropathy   . OSA (obstructive sleep apnea)   . Peripheral vascular disease due to secondary diabetes mellitus ([place])   . Splenomegaly   . 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 TIA (transient ischemic attack) April 2016    Medications:  (Not in a hospital admission)  Scheduled:  . amLODipine  5 mg Oral Daily  . aspirin  81 mg Oral Daily  . atorvastatin  40 mg Oral Daily  . clopidogrel  75 mg Oral Q breakfast  . furosemide  40 mg Intravenous Q12H  . insulin aspart  0-15 Units Subcutaneous TID PC & HS  . levETIRAcetam  500 mg Oral TID  . metoprolol succinate  100 mg Oral Daily  . pantoprazole  40 mg Oral Daily  . pregabalin  50 mg Oral TID  . sodium chloride flush  3 mL Intravenous Once  . venlafaxine  75 mg Oral TID   Infusions:   PRN: acetaminophen, ALPRAZolam, labetalol, morphine injection, nitroGLYCERIN, ondansetron (ZOFRAN) IV, oxyCODONE,  zolpidem Anti-infectives (From admission, onward)   None      Assessment: Pharmacy consulted for heparin for ACS. No DOAC noted.   Goal of Therapy:  Heparin level 0.3-0.7 units/ml Monitor platelets by anticoagulation protocol: Yes   Plan:  Give 4000 units bolus x 1 Start heparin infusion at 1150 units/hr Check anti-Xa level in 6 hours and daily while on heparin Continue to monitor H&H and platelets  Oswald Hillock, PharmD, BCPS 10/19/2019,12:31 PM

## 2019-10-19 NOTE — Consult Note (Signed)
ANTICOAGULATION CONSULT NOTE  Pharmacy Consult for Heparin Indication: chest pain/ACS  Patient Measurements: Height: 5' 7"  (170.2 cm) Weight: 89.4 kg (197 lb 1.5 oz) IBW/kg (Calculated) : 66.1 Heparin Dosing Weight: 84.6 kg  Vital Signs: Temp: 97.9 F (36.6 C) (04/08 1435) Temp Source: Oral (04/08 1435) BP: 158/74 (04/08 1645) Pulse Rate: 62 (04/08 1645)  Labs: Recent Labs    10/18/19 1526 10/18/19 1832 10/19/19 0418  HGB 14.3  --   --   HCT 39.4  --   --   PLT 172  --   --   CREATININE 1.47*  --   --   TROPONINIHS 37* 50* 33*    Estimated Creatinine Clearance: 56.3 mL/min (A) (by C-G formula based on SCr of 1.47 mg/dL (H)).   Medical History: Past Medical History:  Diagnosis Date  . CHF (congestive heart failure) (HCC)    diastolic (echo at [facility] [address]) EF 55-60%  . Cirrhosis ([place])    NASH per records  . COPD (chronic obstructive pulmonary disease) ([place])   . Depression   . Diabetes mellitus with neuropathy ([place])   . Heart attack ([place])   . Neuropathy   . OSA (obstructive sleep apnea)   . Peripheral vascular disease due to secondary diabetes mellitus ([place])   . Splenomegaly   . 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 TIA (transient ischemic attack) April 2016    Medications:  Medications Prior to Admission  Medication Sig Dispense Refill Last Dose  . acetaminophen (TYLENOL) 500 MG tablet Take 1,000 mg by mouth daily as needed.   prn at prn  . amLODipine (NORVASC) 5 MG tablet Take 5 mg by mouth daily.   10/18/2019 at 1000  . atorvastatin (LIPITOR) 40 MG tablet Take 40 mg by mouth daily.    10/18/2019 at 1000  . clopidogrel (PLAVIX) 75 MG tablet Take 1 tablet (75 mg total) by mouth daily with breakfast. 30 tablet 2 10/18/2019 at 1000  . famotidine (PEPCID) 20 MG tablet Take 1 tablet (20 mg total) by mouth 2 (two) times daily. (Patient taking differently: Take 20 mg by mouth daily as needed. ) 8 tablet 0 prn at prn  . levETIRAcetam (KEPPRA) 500 MG tablet Take 500 mg by  mouth 3 (three) times daily.   10/18/2019 at 1900  . losartan (COZAAR) 100 MG tablet Take 100 mg by mouth daily.   10/18/2019 at 1000  . metFORMIN (GLUCOPHAGE) 500 MG tablet Take 500 mg by mouth 2 (two) times daily with a meal.   10/18/2019 at 1900  . metoprolol succinate (TOPROL-XL) 100 MG 24 hr tablet Take 100 mg by mouth daily.   10/18/2019 at 1000  . nitroGLYCERIN (NITROSTAT) 0.4 MG SL tablet Place 0.4 mg under the tongue every 5 (five) minutes as needed for chest pain.   prn at prn  . oxyCODONE (OXY IR/ROXICODONE) 5 MG immediate release tablet Take 5 mg by mouth every 8 (eight) hours as needed for pain.   prn at prn  . venlafaxine (EFFEXOR) 75 MG tablet Take 75 mg by mouth 3 (three) times daily.   10/18/2019 at 1900   Scheduled:  . [MAR Hold] amLODipine  5 mg Oral Daily  . [MAR Hold] aspirin  81 mg Oral Daily  . [MAR Hold] atorvastatin  40 mg Oral Daily  . [MAR Hold] clopidogrel  75 mg Oral Q breakfast  . [MAR Hold] furosemide  40 mg Intravenous Q12H  . [MAR Hold] heparin  4,000 Units Intravenous Once  . [  MAR Hold] insulin aspart  0-15 Units Subcutaneous TID PC & HS  . [MAR Hold] levETIRAcetam  500 mg Oral TID  . [MAR Hold] metoprolol succinate  100 mg Oral Daily  . [MAR Hold] pantoprazole  40 mg Oral Daily  . [MAR Hold] pregabalin  50 mg Oral TID  . [MAR Hold] sodium chloride flush  3 mL Intravenous Once  . sodium chloride flush  3 mL Intravenous Q12H  . [MAR Hold] venlafaxine  75 mg Oral TID   Infusions:  . sodium chloride    . [START ON 10/20/2019] sodium chloride 1 mL/kg/hr (10/19/19 1448)   Followed by  . [START ON 10/20/2019] sodium chloride    . heparin     PRN: sodium chloride, [MAR Hold] acetaminophen, [MAR Hold] ALPRAZolam, [MAR Hold] labetalol, [MAR Hold]  morphine injection, [MAR Hold] nitroGLYCERIN, [MAR Hold] ondansetron (ZOFRAN) IV, [MAR Hold] oxyCODONE, sodium chloride flush, [MAR Hold] zolpidem Anti-infectives (From admission, onward)   None      Assessment: Pharmacy  consulted for heparin for ACS. No DOAC noted. LHC showed multivessel CAD with recommendation for patient to transfer to [facility] on 4/9. The heparin was stopped during the [facility] and cardiology would like to restart at midnight 10/20/19  Goal of Therapy:  Heparin level 0.3-0.7 units/ml Monitor platelets by anticoagulation protocol: Yes   Plan:   restart heparin infusion at 1150 units/hr (bolus deferred)  Check anti-Xa level in 6 hours after restart and daily while on heparin  Continue to monitor H&H and platelets  Dallie Piles, PharmD, BCPS 10/19/2019,4:51 PM

## 2019-10-19 NOTE — ED Notes (Signed)
Pt currently sleeping in room, Hob lowered per request

## 2019-10-19 NOTE — Consult Note (Signed)
Cardiology Consultation:   Patient ID: Larry Terrell; [id number]; 12-Aug-1960   Admit date: 10/18/2019 Date of Consult: 10/19/2019  Primary Care Provider: [facility], [place] [facility] Cardiologist: [facility]   Patient Profile:   Larry Terrell is a 59 y.o. male with a hx of two-vessel CAD with prior MI status post stenting to the RCA and LCx as detailed below, HFpEF, TIA, PAD status post aortobifemoral bypass in 2003 with known stenosis of the bilateral SFAs, DM2, HTN, HLD, and prior tobacco use who is being seen today for the evaluation of chest pain at the request of Dr. Sidney Ace.  History of Present Illness:   Larry Terrell has history of MI with remote stenting to the RCA and LCx with presenting symptoms at that time being left shoulder pain.  He was admitted to the hospital in 07/2017 with chest pain and minimal troponin elevation of 0.15.  Echo at that time showed EF 55 to 60%, no regional wall motion abnormalities, grade 2 diastolic dysfunction, mild mitral regurgitation, moderately dilated left atrium, normal RVSF.Larry Terrell  He underwent diagnostic cath on 08/09/2017 which showed significant two-vessel CAD with patent RCA stents with moderate ISR and severe subocclusive ISR in the LCx.  He underwent successful balloon angioplasty and DES to the LCx to cover from the distal to proximal area with 1 long stent.  It appears he was lost to cardiology follow-up after this.  He has most recently been seen by vascular surgery at [facility] for his underlying PVD with worsening lifestyle limiting short distance claudication with noninvasive imaging showing decreased ABIs especially on the right with recommendation for possible surgical intervention.  He presented to [facility] overnight with a several day history of left shoulder discomfort that feels very similar to his presentation during his MIs.  No frank chest pain, dyspnea, palpitations, dizziness, presyncope, syncope.  No lower extremity swelling, PND, or early satiety.   He has stable two-pillow orthopnea.  BP has been elevated into the 222L systolic.  EKG as below.  CXR showed pulmonary vascular congestion. Labs showed high sensitivity troponin 37-->50-->33, BNP 177, HGB 14.3, PLT 172, potassium 4.6, BUN 19, SCr 1.47.  He continues to note left shoulder discomfort at time of cardiology consult.  He prefers definitive evaluation with cardiac cath.  He is n.p.o.  Past Medical History:  Diagnosis Date  . CHF (congestive heart failure) (HCC)    diastolic (echo at [facility] 7989) EF 55-60%  . Cirrhosis ([place])    NASH per records  . COPD (chronic obstructive pulmonary disease) ([place])   . Depression   . Diabetes mellitus with neuropathy ([place])   . Heart attack ([place])   . Neuropathy   . OSA (obstructive sleep apnea)   . Peripheral vascular disease due to secondary diabetes mellitus ([place])   . Splenomegaly   . Temporal giant cell arteritis ([place])   . TIA (transient ischemic attack) April 2016    Past Surgical History:  Procedure Laterality Date  . AORTA - FEMORAL ARTERY BYPASS GRAFT Bilateral   . CARDIAC CATHETERIZATION    . cardiac stents  July 2015   . CORONARY STENT INTERVENTION N/A 08/09/2017   Procedure: CORONARY STENT INTERVENTION;  Surgeon: Wellington Hampshire, MD;  Location: [place] [facility];  Service: Cardiovascular;  Laterality: N/A;  . LAPAROSCOPIC APPENDECTOMY N/A 07/10/2015   Procedure: APPENDECTOMY LAPAROSCOPIC;  Surgeon: Rolm Bookbinder, MD;  Location: [place];  Service: General;  Laterality: N/A;  . LEFT HEART CATH AND CORONARY ANGIOGRAPHY  N/A 08/09/2017   Procedure: LEFT HEART CATH AND CORONARY ANGIOGRAPHY;  Surgeon: Wellington Hampshire, MD;  Location: [place] [facility];  Service: Cardiovascular;  Laterality: N/A;     Home Meds: Prior to Admission medications   Medication Sig Start Date End Date Taking? Authorizing Provider  acetaminophen (TYLENOL) 500 MG tablet Take 1,000 mg by mouth daily as needed.   Yes [provider]  amLODipine  (NORVASC) 5 MG tablet Take 5 mg by mouth daily. 09/04/19 09/03/20 Yes [provider]  atorvastatin (LIPITOR) 40 MG tablet Take 40 mg by mouth daily.    Yes [provider]  clopidogrel (PLAVIX) 75 MG tablet Take 1 tablet (75 mg total) by mouth daily with breakfast. 08/11/17  Yes Fritzi Mandes, MD  famotidine (PEPCID) 20 MG tablet Take 1 tablet (20 mg total) by mouth 2 (two) times daily. Patient taking differently: Take 20 mg by mouth daily as needed.  12/26/17  Yes Paulette Blanch, MD  levETIRAcetam (KEPPRA) 500 MG tablet Take 500 mg by mouth 3 (three) times daily. 10/03/19  Yes [provider]  losartan (COZAAR) 100 MG tablet Take 100 mg by mouth daily. 05/11/19  Yes [provider]  metFORMIN (GLUCOPHAGE) 500 MG tablet Take 500 mg by mouth 2 (two) times daily with a meal.   Yes [provider]  metoprolol succinate (TOPROL-XL) 100 MG 24 hr tablet Take 100 mg by mouth daily. 05/11/19  Yes [provider]  nitroGLYCERIN (NITROSTAT) 0.4 MG SL tablet Place 0.4 mg under the tongue every 5 (five) minutes as needed for chest pain.   Yes [provider]  oxyCODONE (OXY IR/ROXICODONE) 5 MG immediate release tablet Take 5 mg by mouth every 8 (eight) hours as needed for pain. 05/18/19  Yes [provider]  venlafaxine (EFFEXOR) 75 MG tablet Take 75 mg by mouth 3 (three) times daily. 05/11/19  Yes [provider]    Inpatient Medications: Scheduled Meds: . amLODipine  5 mg Oral Daily  . aspirin  81 mg Oral Daily  . atorvastatin  40 mg Oral Daily  . clopidogrel  75 mg Oral Q breakfast  . enoxaparin (LOVENOX) injection  40 mg Subcutaneous Q24H  . furosemide  40 mg Intravenous Q12H  . insulin aspart  0-15 Units Subcutaneous TID PC & HS  . levETIRAcetam  500 mg Oral TID  . metoprolol succinate  100 mg Oral Daily  . pantoprazole  40 mg Oral Daily  . pregabalin  50 mg Oral TID  . sodium chloride flush  3 mL Intravenous Once  .  venlafaxine  75 mg Oral TID   Continuous Infusions:  PRN Meds: acetaminophen, ALPRAZolam, labetalol, morphine injection, nitroGLYCERIN, ondansetron (ZOFRAN) IV, oxyCODONE, zolpidem  Allergies:   Allergies  Allergen Reactions  . Gabapentin   . Ace Inhibitors Cough    Other reaction(s): Other (See Comments) cough    Social History:   Social History   Socioeconomic History  . Marital status: Divorced    Spouse name: Not on file  . Number of children: Not on file  . Years of education: Not on file  . Highest education level: Not on file  Occupational History  . Not on file  Tobacco Use  . Smoking status: Former Smoker    Types: Cigarettes    Quit date: 2009    Years since quitting: 12.2  . Smokeless tobacco: Never Used  Substance and Sexual Activity  . Alcohol use: No    Alcohol/week: 0.0  standard drinks  . Drug use: No  . Sexual activity: Not on file  Other Topics Concern  . Not on file  Social History Narrative  . Not on file   Social Determinants of Health   Financial Resource Strain:   . Difficulty of Paying Living Expenses:   Food Insecurity:   . Worried About Charity fundraiser in the Last Year:   . Arboriculturist in the Last Year:   Transportation Needs:   . Film/video editor (Medical):   Larry Terrell Lack of Transportation (Non-Medical):   Physical Activity:   . Days of Exercise per Week:   . Minutes of Exercise per Session:   Stress:   . Feeling of Stress :   Social Connections:   . Frequency of Communication with Friends and Family:   . Frequency of Social Gatherings with Friends and Family:   . Attends Religious Services:   . Active Member of Clubs or Organizations:   . Attends Archivist Meetings:   Larry Terrell Marital Status:   Intimate Partner Violence:   . Fear of Current or Ex-Partner:   . Emotionally Abused:   Larry Terrell Physically Abused:   . Sexually Abused:      Family History:   Family History  Problem Relation Age of Onset  . Heart  attack Mother 101  . Stroke Mother   . Heart attack Father   . Alzheimer's disease Father     ROS:  Review of Systems  Constitutional: Positive for malaise/fatigue. Negative for chills, diaphoresis, fever and weight loss.  HENT: Negative for congestion.   Eyes: Negative for discharge and redness.  Respiratory: Negative for cough, sputum production, shortness of breath and wheezing.   Cardiovascular: Negative for chest pain, palpitations, orthopnea, claudication, leg swelling and PND.  Gastrointestinal: Negative for abdominal pain, heartburn, nausea and vomiting.  Musculoskeletal: Positive for joint pain. Negative for falls and myalgias.       Left shoulder pain  Skin: Negative for rash.  Neurological: Negative for dizziness, tingling, tremors, sensory change, speech change, focal weakness, loss of consciousness and weakness.  Endo/Heme/Allergies: Does not bruise/bleed easily.  Psychiatric/Behavioral: Negative for substance abuse. The patient is not nervous/anxious.   All other systems reviewed and are negative.     Physical Exam/Data:   Vitals:   10/19/19 1045 10/19/19 1100 10/19/19 1115 10/19/19 1130  BP:    (!) 155/76  Pulse: (!) 59 63 61 64  Resp:      Temp:      SpO2: 96% 94% 92% 91%  Weight:      Height:       No intake or output data in the 24 hours ending 10/19/19 1142 Filed Weights   10/18/19 1523  Weight: 89.4 kg   Body mass index is 30.85 kg/m.   Physical Exam: General: Well developed, well nourished, in no acute distress. Head: Normocephalic, atraumatic, sclera non-icteric, no xanthomas, nares without discharge.  Neck: Negative for carotid bruits. JVD not elevated. Lungs: Clear bilaterally to auscultation without wheezes, rales, or rhonchi. Breathing is unlabored. Heart: RRR with S1 S2. No murmurs, rubs, or gallops appreciated. Abdomen: Soft, non-tender, non-distended with normoactive bowel sounds. No hepatomegaly. No rebound/guarding. No obvious abdominal  masses. Msk:  Strength and tone appear normal for age.  No tenderness to palpation along the left shoulder or noted symptoms with range of motion of the left shoulder. Extremities: No clubbing or cyanosis. No edema. Distal pedal pulses are 2+ and equal bilaterally.  Neuro: Alert and oriented X 3. No facial asymmetry. No focal deficit. Moves all extremities spontaneously. Psych:  Responds to questions appropriately with a normal affect.   EKG:  The EKG was personally reviewed and demonstrates: NSR, 80 bpm, first-degree AV block, prior inferior infarct, inferior T wave inversion, PAC Telemetry:  Telemetry was personally reviewed and demonstrates: SR  Weights: [employer]   10/18/19 1523  Weight: 89.4 kg    Relevant CV Studies: 2D echo 03/2019: 1. Left ventricular ejection fraction, by visual estimation, is 55 to  60%. The left ventricle has normal function. Left ventricular septal wall  thickness was moderately increased. Moderately increased left ventricular  posterior wall thickness. There is  moderately increased left ventricular hypertrophy.  2. Left ventricular diastolic Doppler parameters are consistent with  impaired relaxation pattern of LV diastolic filling.  3. Global right ventricle has normal systolic function.The right  ventricular size is normal. No increase in right ventricular wall  thickness.  4. Left atrial size was mildly dilated.  5. Right atrial size was mildly dilated.  6. The mitral valve is grossly normal. Mild to moderate mitral valve  regurgitation.  7. The tricuspid valve is not well visualized. Tricuspid valve  regurgitation is mild.  8. The aortic valve was not well visualized Aortic valve regurgitation is  trivial by color flow Doppler.  9. The pulmonic valve was not well visualized. Pulmonic valve  regurgitation is trivial by color flow Doppler.  10. The aortic root was not well visualized.  11. The interatrial septum was not  assessed. __________  LHC 07/2017:  The left ventricular systolic function is normal.  LV end diastolic pressure is normal.  The left ventricular ejection fraction is 55-65% by visual estimate.  Mid RCA lesion is 40% stenosed.  Mid RCA to Dist RCA lesion is 10% stenosed.  Mid Cx to Dist Cx lesion is 99% stenosed.  Prox Cx lesion is 70% stenosed.  A drug-eluting stent was successfully placed using a STENT RESOLUTE ONYX [id number].  Post intervention, there is a 0% residual stenosis.  Post intervention, there is a 0% residual stenosis.   1. Significant underlying two-vessel coronary artery disease.  Patent RCA stents with moderate mid in-stent restenosis.  Severe subocclusive in-stent restenosis in the left circumflex. 2.  Normal LV systolic function and left ventricular end-diastolic pressure. 3.  Successful balloon angioplasty and drug-eluting stent placement to the left circumflex to cover from the distal to the proximal area with one long stent.  Recommendations: Dual antiplatelet therapy indefinitely if tolerated given multiple stents.  Aggressive treatment of risk factors. Discharge home tomorrow if no complications.   Laboratory Data:  Chemistry Recent Labs  Lab 10/18/19 1526  NA 133*  K 4.6  CL 101  CO2 24  GLUCOSE 366*  BUN 19  CREATININE 1.47*  CALCIUM 9.0  GFRNONAA 51*  GFRAA 59*  ANIONGAP 8    No results for input(s): PROT, ALBUMIN, AST, ALT, ALKPHOS, BILITOT in the last 168 hours. Hematology Recent Labs  Lab 10/18/19 1526  WBC 8.9  RBC 4.30  HGB 14.3  HCT 39.4  MCV 91.6  MCH 33.3  MCHC 36.3*  RDW 14.9  PLT 172   Cardiac EnzymesNo results for input(s): TROPONINI in the last 168 hours. No results for input(s): TROPIPOC in the last 168 hours.  BNP Recent Labs  Lab 10/18/19 2055  BNP 177.0*    DDimer No results for input(s): DDIMER in the last 168 hours.  Radiology/Studies:  DG Chest  2 View  Result Date: 10/18/2019 IMPRESSION: 1.  Pulmonary vascular congestion. Electronically Signed   By: Kerby Moors M.D.   On: 10/18/2019 15:51    Assessment and Plan:   1.  CAD involving the native coronary arteries with unstable angina: -He continues to note left shoulder discomfort which is his anginal equivalent -Minimally elevated high-sensitivity troponin not consistent with MI -N.p.o. -Start heparin drip -ASA -Plavix -Lipitor -Plan for LHC today -Check echo -Await LVEDP to help further gauge diuresis given pulmonary vascular congestion noted on chest x-ray -Risks and benefits of cardiac catheterization have been discussed with the patient including risks of bleeding, bruising, infection, kidney damage, stroke, heart attack, urgent need for bypass, injury to a limb, and death. The patient understands these risks and is willing to proceed with the procedure. All questions have been answered and concerns listened to  2.  PVD with lifestyle limiting claudication: -Followed by [facility] with plans for potential surgical intervention in the near future -ASA -Plavix -Lipitor  3.  HTN: -BP improving -Toprol -Amlodipine -Escalate antihypertensive therapy as indicated  4.  HLD: -Direct LDL 72 this admission with goal LDL being less than 70 -Recommend escalation of atorvastatin to 80 mg daily when able following his cath  5.  HFpEF: -Await LVEDP -Gentle diuresis -Optimal BP control -Daily weights -Strict I's and O's  For questions or updates, please contact [facility] Please consult [email or web address] for contact info under Cardiology/STEMI.   Signed, Christell Faith, PA-C [facility] Pager: [phone] 10/19/2019, 11:42 AM

## 2019-10-19 NOTE — Progress Notes (Signed)
Patient transferred to rm 240 from the cath lab. Patient alert, VSS, no complaints of pain. Diet order placed, call bell within reach and bed alarm is on. Patient resting comfortably in bed. Patient does not meet qualifications for [place] reading.

## 2019-10-20 ENCOUNTER — Inpatient Hospital Stay (HOSPITAL_COMMUNITY)
Admit: 2019-10-20 | Discharge: 2019-10-20 | Disposition: A | Discharge status: Home / Self Care | Payer: Medicare HMO | Attending: Physician Assistant | Admitting: Physician Assistant

## 2019-10-20 ENCOUNTER — Encounter: Payer: Self-pay | Admitting: Cardiology

## 2019-10-20 ENCOUNTER — Inpatient Hospital Stay (HOSPITAL_COMMUNITY)
Admission: AD | Admit: 2019-10-20 | Discharge: 2019-11-06 | DRG: 235 | Disposition: A | Discharge status: Skilled Nursing Facility | Payer: Medicare HMO | Source: Other Acute Inpatient Hospital | Attending: Cardiothoracic Surgery | Admitting: Cardiothoracic Surgery

## 2019-10-20 DIAGNOSIS — Z01818 Encounter for other preprocedural examination: Secondary | ICD-10-CM | POA: Diagnosis not present

## 2019-10-20 DIAGNOSIS — J9601 Acute respiratory failure with hypoxia: Secondary | ICD-10-CM | POA: Diagnosis not present

## 2019-10-20 DIAGNOSIS — E785 Hyperlipidemia, unspecified: Secondary | ICD-10-CM

## 2019-10-20 DIAGNOSIS — K083 Retained dental root: Secondary | ICD-10-CM | POA: Diagnosis present

## 2019-10-20 DIAGNOSIS — I5033 Acute on chronic diastolic (congestive) heart failure: Secondary | ICD-10-CM | POA: Diagnosis not present

## 2019-10-20 DIAGNOSIS — Q278 Other specified congenital malformations of peripheral vascular system: Secondary | ICD-10-CM

## 2019-10-20 DIAGNOSIS — K045 Chronic apical periodontitis: Secondary | ICD-10-CM | POA: Diagnosis present

## 2019-10-20 DIAGNOSIS — I2510 Atherosclerotic heart disease of native coronary artery without angina pectoris: Secondary | ICD-10-CM

## 2019-10-20 DIAGNOSIS — I10 Essential (primary) hypertension: Secondary | ICD-10-CM

## 2019-10-20 DIAGNOSIS — Z7902 Long term (current) use of antithrombotics/antiplatelets: Secondary | ICD-10-CM

## 2019-10-20 DIAGNOSIS — Z452 Encounter for adjustment and management of vascular access device: Secondary | ICD-10-CM

## 2019-10-20 DIAGNOSIS — E669 Obesity, unspecified: Secondary | ICD-10-CM | POA: Diagnosis present

## 2019-10-20 DIAGNOSIS — R1310 Dysphagia, unspecified: Secondary | ICD-10-CM | POA: Diagnosis not present

## 2019-10-20 DIAGNOSIS — E876 Hypokalemia: Secondary | ICD-10-CM | POA: Diagnosis not present

## 2019-10-20 DIAGNOSIS — J449 Chronic obstructive pulmonary disease, unspecified: Secondary | ICD-10-CM | POA: Diagnosis not present

## 2019-10-20 DIAGNOSIS — K219 Gastro-esophageal reflux disease without esophagitis: Secondary | ICD-10-CM | POA: Diagnosis present

## 2019-10-20 DIAGNOSIS — I130 Hypertensive heart and chronic kidney disease with heart failure and stage 1 through stage 4 chronic kidney disease, or unspecified chronic kidney disease: Secondary | ICD-10-CM | POA: Diagnosis not present

## 2019-10-20 DIAGNOSIS — E1151 Type 2 diabetes mellitus with diabetic peripheral angiopathy without gangrene: Secondary | ICD-10-CM | POA: Diagnosis present

## 2019-10-20 DIAGNOSIS — M264 Malocclusion, unspecified: Secondary | ICD-10-CM | POA: Diagnosis present

## 2019-10-20 DIAGNOSIS — N170 Acute kidney failure with tubular necrosis: Secondary | ICD-10-CM | POA: Diagnosis not present

## 2019-10-20 DIAGNOSIS — Z20822 Contact with and (suspected) exposure to covid-19: Secondary | ICD-10-CM | POA: Diagnosis present

## 2019-10-20 DIAGNOSIS — I200 Unstable angina: Secondary | ICD-10-CM | POA: Diagnosis present

## 2019-10-20 DIAGNOSIS — I25110 Atherosclerotic heart disease of native coronary artery with unstable angina pectoris: Secondary | ICD-10-CM | POA: Diagnosis not present

## 2019-10-20 DIAGNOSIS — G4733 Obstructive sleep apnea (adult) (pediatric): Secondary | ICD-10-CM | POA: Diagnosis not present

## 2019-10-20 DIAGNOSIS — J90 Pleural effusion, not elsewhere classified: Secondary | ICD-10-CM

## 2019-10-20 DIAGNOSIS — Z6833 Body mass index (BMI) 33.0-33.9, adult: Secondary | ICD-10-CM

## 2019-10-20 DIAGNOSIS — Y831 Surgical operation with implant of artificial internal device as the cause of abnormal reaction of the patient, or of later complication, without mention of misadventure at the time of the procedure: Secondary | ICD-10-CM | POA: Diagnosis not present

## 2019-10-20 DIAGNOSIS — J9811 Atelectasis: Secondary | ICD-10-CM | POA: Diagnosis not present

## 2019-10-20 DIAGNOSIS — Z888 Allergy status to other drugs, medicaments and biological substances status: Secondary | ICD-10-CM | POA: Diagnosis not present

## 2019-10-20 DIAGNOSIS — I252 Old myocardial infarction: Secondary | ICD-10-CM | POA: Diagnosis not present

## 2019-10-20 DIAGNOSIS — Z8673 Personal history of transient ischemic attack (TIA), and cerebral infarction without residual deficits: Secondary | ICD-10-CM | POA: Diagnosis not present

## 2019-10-20 DIAGNOSIS — K7460 Unspecified cirrhosis of liver: Secondary | ICD-10-CM | POA: Diagnosis present

## 2019-10-20 DIAGNOSIS — Y838 Other surgical procedures as the cause of abnormal reaction of the patient, or of later complication, without mention of misadventure at the time of the procedure: Secondary | ICD-10-CM | POA: Diagnosis not present

## 2019-10-20 DIAGNOSIS — R570 Cardiogenic shock: Secondary | ICD-10-CM | POA: Diagnosis not present

## 2019-10-20 DIAGNOSIS — I5031 Acute diastolic (congestive) heart failure: Secondary | ICD-10-CM | POA: Diagnosis not present

## 2019-10-20 DIAGNOSIS — I5030 Unspecified diastolic (congestive) heart failure: Secondary | ICD-10-CM

## 2019-10-20 DIAGNOSIS — Z794 Long term (current) use of insulin: Secondary | ICD-10-CM

## 2019-10-20 DIAGNOSIS — E1122 Type 2 diabetes mellitus with diabetic chronic kidney disease: Secondary | ICD-10-CM | POA: Diagnosis present

## 2019-10-20 DIAGNOSIS — J9602 Acute respiratory failure with hypercapnia: Secondary | ICD-10-CM | POA: Diagnosis not present

## 2019-10-20 DIAGNOSIS — E118 Type 2 diabetes mellitus with unspecified complications: Secondary | ICD-10-CM | POA: Diagnosis present

## 2019-10-20 DIAGNOSIS — T82855A Stenosis of coronary artery stent, initial encounter: Principal | ICD-10-CM | POA: Diagnosis present

## 2019-10-20 DIAGNOSIS — G8929 Other chronic pain: Secondary | ICD-10-CM | POA: Diagnosis present

## 2019-10-20 DIAGNOSIS — N189 Chronic kidney disease, unspecified: Secondary | ICD-10-CM | POA: Diagnosis not present

## 2019-10-20 DIAGNOSIS — Z951 Presence of aortocoronary bypass graft: Secondary | ICD-10-CM | POA: Diagnosis not present

## 2019-10-20 DIAGNOSIS — I214 Non-ST elevation (NSTEMI) myocardial infarction: Secondary | ICD-10-CM | POA: Diagnosis not present

## 2019-10-20 DIAGNOSIS — R05 Cough: Secondary | ICD-10-CM | POA: Diagnosis not present

## 2019-10-20 DIAGNOSIS — Z955 Presence of coronary angioplasty implant and graft: Secondary | ICD-10-CM

## 2019-10-20 DIAGNOSIS — N184 Chronic kidney disease, stage 4 (severe): Secondary | ICD-10-CM | POA: Diagnosis not present

## 2019-10-20 DIAGNOSIS — R079 Chest pain, unspecified: Secondary | ICD-10-CM

## 2019-10-20 DIAGNOSIS — F419 Anxiety disorder, unspecified: Secondary | ICD-10-CM | POA: Diagnosis present

## 2019-10-20 DIAGNOSIS — I440 Atrioventricular block, first degree: Secondary | ICD-10-CM | POA: Diagnosis present

## 2019-10-20 DIAGNOSIS — E782 Mixed hyperlipidemia: Secondary | ICD-10-CM

## 2019-10-20 DIAGNOSIS — D72829 Elevated white blood cell count, unspecified: Secondary | ICD-10-CM | POA: Diagnosis not present

## 2019-10-20 DIAGNOSIS — E1142 Type 2 diabetes mellitus with diabetic polyneuropathy: Secondary | ICD-10-CM | POA: Diagnosis not present

## 2019-10-20 DIAGNOSIS — Z01810 Encounter for preprocedural cardiovascular examination: Secondary | ICD-10-CM | POA: Diagnosis not present

## 2019-10-20 DIAGNOSIS — E1165 Type 2 diabetes mellitus with hyperglycemia: Secondary | ICD-10-CM | POA: Diagnosis not present

## 2019-10-20 DIAGNOSIS — K06020 Generalized gingival recession, unspecified: Secondary | ICD-10-CM | POA: Diagnosis present

## 2019-10-20 DIAGNOSIS — Z823 Family history of stroke: Secondary | ICD-10-CM

## 2019-10-20 DIAGNOSIS — F329 Major depressive disorder, single episode, unspecified: Secondary | ICD-10-CM | POA: Diagnosis present

## 2019-10-20 DIAGNOSIS — Z9889 Other specified postprocedural states: Secondary | ICD-10-CM

## 2019-10-20 DIAGNOSIS — K760 Fatty (change of) liver, not elsewhere classified: Secondary | ICD-10-CM | POA: Diagnosis present

## 2019-10-20 DIAGNOSIS — N179 Acute kidney failure, unspecified: Secondary | ICD-10-CM | POA: Diagnosis not present

## 2019-10-20 DIAGNOSIS — Z8249 Family history of ischemic heart disease and other diseases of the circulatory system: Secondary | ICD-10-CM

## 2019-10-20 DIAGNOSIS — D62 Acute posthemorrhagic anemia: Secondary | ICD-10-CM | POA: Diagnosis not present

## 2019-10-20 DIAGNOSIS — E872 Acidosis: Secondary | ICD-10-CM | POA: Diagnosis not present

## 2019-10-20 DIAGNOSIS — I358 Other nonrheumatic aortic valve disorders: Secondary | ICD-10-CM | POA: Diagnosis present

## 2019-10-20 DIAGNOSIS — Z87891 Personal history of nicotine dependence: Secondary | ICD-10-CM

## 2019-10-20 DIAGNOSIS — Z5329 Procedure and treatment not carried out because of patient's decision for other reasons: Secondary | ICD-10-CM | POA: Diagnosis not present

## 2019-10-20 DIAGNOSIS — I739 Peripheral vascular disease, unspecified: Secondary | ICD-10-CM

## 2019-10-20 DIAGNOSIS — J95821 Acute postprocedural respiratory failure: Secondary | ICD-10-CM | POA: Diagnosis not present

## 2019-10-20 DIAGNOSIS — Z79899 Other long term (current) drug therapy: Secondary | ICD-10-CM

## 2019-10-20 DIAGNOSIS — I480 Paroxysmal atrial fibrillation: Secondary | ICD-10-CM | POA: Diagnosis not present

## 2019-10-20 DIAGNOSIS — Z820 Family history of epilepsy and other diseases of the nervous system: Secondary | ICD-10-CM

## 2019-10-20 DIAGNOSIS — G40909 Epilepsy, unspecified, not intractable, without status epilepticus: Secondary | ICD-10-CM | POA: Diagnosis present

## 2019-10-20 DIAGNOSIS — Z79891 Long term (current) use of opiate analgesic: Secondary | ICD-10-CM

## 2019-10-20 DIAGNOSIS — M549 Dorsalgia, unspecified: Secondary | ICD-10-CM | POA: Diagnosis present

## 2019-10-20 DIAGNOSIS — K029 Dental caries, unspecified: Secondary | ICD-10-CM | POA: Diagnosis present

## 2019-10-20 LAB — MAGNESIUM: Magnesium: 1.8 mg/dL (ref 1.7–2.4)

## 2019-10-20 LAB — ECHOCARDIOGRAM COMPLETE
Height: 71 in
Weight: 3299.2 oz

## 2019-10-20 LAB — HEPATIC FUNCTION PANEL
ALT: 29 U/L (ref 0–44)
AST: 27 U/L (ref 15–41)
Albumin: 3.4 g/dL — ABNORMAL LOW (ref 3.5–5.0)
Alkaline Phosphatase: 87 U/L (ref 38–126)
Bilirubin, Direct: 0.2 mg/dL (ref 0.0–0.2)
Indirect Bilirubin: 1 mg/dL — ABNORMAL HIGH (ref 0.3–0.9)
Total Bilirubin: 1.2 mg/dL (ref 0.3–1.2)
Total Protein: 6.2 g/dL — ABNORMAL LOW (ref 6.5–8.1)

## 2019-10-20 LAB — BASIC METABOLIC PANEL
Anion gap: 9 (ref 5–15)
BUN: 27 mg/dL — ABNORMAL HIGH (ref 8–23)
CO2: 24 mmol/L (ref 22–32)
Calcium: 8.6 mg/dL — ABNORMAL LOW (ref 8.9–10.3)
Chloride: 100 mmol/L (ref 98–111)
Creatinine, Ser: 1.36 mg/dL — ABNORMAL HIGH (ref 0.61–1.24)
GFR calc Af Amer: >60 mL/min (ref >60)
GFR calc non Af Amer: 56 mL/min — ABNORMAL LOW (ref >60)
Glucose, Bld: 292 mg/dL — ABNORMAL HIGH (ref 70–99)
Potassium: 4.2 mmol/L (ref 3.5–5.1)
Sodium: 133 mmol/L — ABNORMAL LOW (ref 135–145)

## 2019-10-20 LAB — HEMOGLOBIN A1C
Hgb A1c MFr Bld: 7.3 % — ABNORMAL HIGH (ref 4.8–5.6)
Mean Plasma Glucose: 163 mg/dL

## 2019-10-20 LAB — HEPARIN LEVEL (UNFRACTIONATED)
Heparin Unfractionated: 0.32 IU/mL (ref 0.30–0.70)
Heparin Unfractionated: 0.38 IU/mL (ref 0.30–0.70)

## 2019-10-20 LAB — GLUCOSE, CAPILLARY
Glucose-Capillary: 281 mg/dL — ABNORMAL HIGH (ref 70–99)
Glucose-Capillary: 226 mg/dL — ABNORMAL HIGH (ref 70–99)
Glucose-Capillary: 354 mg/dL — ABNORMAL HIGH (ref 70–99)

## 2019-10-20 LAB — CBC
HCT: 37 % — ABNORMAL LOW (ref 39.0–52.0)
Hemoglobin: 13.3 g/dL (ref 13.0–17.0)
MCH: 33.3 pg (ref 26.0–34.0)
MCHC: 35.9 g/dL (ref 30.0–36.0)
MCV: 92.5 fL (ref 80.0–100.0)
Platelets: 137 10*3/uL — ABNORMAL LOW (ref 150–400)
RBC: 4 MIL/uL — ABNORMAL LOW (ref 4.22–5.81)
RDW: 15.2 % (ref 11.5–15.5)
WBC: 8.8 10*3/uL (ref 4.0–10.5)
nRBC: 0 % (ref 0.0–0.2)

## 2019-10-20 LAB — TROPONIN I (HIGH SENSITIVITY): Troponin I (High Sensitivity): 24 ng/L — ABNORMAL HIGH (ref <18)

## 2019-10-20 MED ORDER — PANTOPRAZOLE SODIUM 40 MG PO TBEC
40.0000 mg | DELAYED_RELEASE_TABLET | Freq: Every day | ORAL | Status: DC
  Administered 2019-10-21 – 2019-10-25 (×5): 40 mg via ORAL
  Filled 2019-10-20 (×5): qty 1

## 2019-10-20 MED ORDER — EZETIMIBE 10 MG PO TABS: 10.0000 mg | ORAL_TABLET | Freq: Every day | ORAL | 3 refills | Status: AC

## 2019-10-20 MED ORDER — ONDANSETRON HCL 4 MG/2ML IJ SOLN
4.0000 mg | Freq: Four times a day (QID) | INTRAMUSCULAR | Status: DC | PRN
  Administered 2019-10-26: 14:00:00 4 mg via INTRAVENOUS

## 2019-10-20 MED ORDER — INSULIN ASPART 100 UNIT/ML ~~LOC~~ SOLN
0.0000 [IU] | Freq: Every day | SUBCUTANEOUS | Status: DC
  Administered 2019-10-20: 23:00:00 5 [IU] via SUBCUTANEOUS
  Administered 2019-10-21: 3 [IU] via SUBCUTANEOUS
  Administered 2019-10-22: 21:00:00 2 [IU] via SUBCUTANEOUS

## 2019-10-20 MED ORDER — OXYCODONE HCL 5 MG PO TABS
5.0000 mg | ORAL_TABLET | ORAL | Status: DC | PRN
  Administered 2019-10-20 – 2019-10-25 (×14): 5 mg via ORAL
  Filled 2019-10-20 (×14): qty 1

## 2019-10-20 MED ORDER — INSULIN ASPART 100 UNIT/ML ~~LOC~~ SOLN
0.0000 [IU] | Freq: Three times a day (TID) | SUBCUTANEOUS | Status: DC
  Administered 2019-10-21: 5 [IU] via SUBCUTANEOUS
  Administered 2019-10-21: 12:00:00 8 [IU] via SUBCUTANEOUS
  Administered 2019-10-21: 3 [IU] via SUBCUTANEOUS
  Administered 2019-10-22: 5 [IU] via SUBCUTANEOUS
  Administered 2019-10-22: 3 [IU] via SUBCUTANEOUS
  Administered 2019-10-22 – 2019-10-23 (×2): 8 [IU] via SUBCUTANEOUS
  Administered 2019-10-23: 3 [IU] via SUBCUTANEOUS
  Administered 2019-10-23: 5 [IU] via SUBCUTANEOUS
  Administered 2019-10-24: 2 [IU] via SUBCUTANEOUS
  Administered 2019-10-24: 5 [IU] via SUBCUTANEOUS
  Administered 2019-10-24: 13 [IU] via SUBCUTANEOUS
  Administered 2019-10-25: 5 [IU] via SUBCUTANEOUS
  Administered 2019-10-25 (×2): 8 [IU] via SUBCUTANEOUS

## 2019-10-20 MED ORDER — PERFLUTREN LIPID MICROSPHERE
1.0000 mL | INTRAVENOUS | Status: AC | PRN
  Administered 2019-10-20: 3 mL via INTRAVENOUS
  Filled 2019-10-20: qty 10

## 2019-10-20 MED ORDER — VENLAFAXINE HCL 75 MG PO TABS
75.0000 mg | ORAL_TABLET | Freq: Three times a day (TID) | ORAL | Status: DC
  Administered 2019-10-21 – 2019-11-06 (×38): 75 mg via ORAL
  Filled 2019-10-20 (×54): qty 1

## 2019-10-20 MED ORDER — ALPRAZOLAM 0.25 MG PO TABS
0.2500 mg | ORAL_TABLET | Freq: Two times a day (BID) | ORAL | Status: DC | PRN
  Administered 2019-10-24 – 2019-10-30 (×4): 0.25 mg via ORAL
  Filled 2019-10-20 (×4): qty 1

## 2019-10-20 MED ORDER — EZETIMIBE 10 MG PO TABS
10.0000 mg | ORAL_TABLET | Freq: Every day | ORAL | Status: DC
  Administered 2019-10-20: 14:00:00 10 mg via ORAL
  Filled 2019-10-20: qty 1

## 2019-10-20 MED ORDER — PANTOPRAZOLE SODIUM 40 MG PO TBEC: 40.0000 mg | DELAYED_RELEASE_TABLET | Freq: Every day | ORAL | 0 refills | Status: AC

## 2019-10-20 MED ORDER — ASPIRIN 81 MG PO CHEW: 81.0000 mg | CHEWABLE_TABLET | Freq: Every day | ORAL | 1 refills | Status: AC

## 2019-10-20 MED ORDER — METOPROLOL SUCCINATE ER 100 MG PO TB24
100.0000 mg | ORAL_TABLET | Freq: Every day | ORAL | Status: DC
  Administered 2019-10-21: 100 mg via ORAL
  Filled 2019-10-20: qty 1

## 2019-10-20 MED ORDER — ACETAMINOPHEN 325 MG PO TABS: 650.0000 mg | ORAL_TABLET | ORAL | Status: DC | PRN

## 2019-10-20 MED ORDER — AMLODIPINE BESYLATE 5 MG PO TABS
5.0000 mg | ORAL_TABLET | Freq: Every day | ORAL | Status: DC
  Administered 2019-10-21 – 2019-10-22 (×2): 5 mg via ORAL
  Filled 2019-10-20 (×2): qty 1

## 2019-10-20 MED ORDER — PREGABALIN 50 MG PO CAPS: 50.0000 mg | ORAL_CAPSULE | Freq: Three times a day (TID) | ORAL | 2 refills | Status: AC

## 2019-10-20 MED ORDER — INSULIN GLARGINE 100 UNIT/ML ~~LOC~~ SOLN
5.0000 [IU] | Freq: Every day | SUBCUTANEOUS | Status: DC
  Administered 2019-10-21 – 2019-10-22 (×2): 5 [IU] via SUBCUTANEOUS
  Filled 2019-10-20 (×2): qty 0.05

## 2019-10-20 MED ORDER — LEVETIRACETAM 250 MG PO TABS
500.0000 mg | ORAL_TABLET | Freq: Three times a day (TID) | ORAL | Status: DC
  Administered 2019-10-20 – 2019-10-27 (×21): 500 mg via ORAL
  Filled 2019-10-20 (×3): qty 1
  Filled 2019-10-20: qty 2
  Filled 2019-10-20 (×3): qty 1
  Filled 2019-10-20: qty 2
  Filled 2019-10-20 (×3): qty 1
  Filled 2019-10-20 (×2): qty 2
  Filled 2019-10-20: qty 1
  Filled 2019-10-20: qty 2
  Filled 2019-10-20 (×6): qty 1

## 2019-10-20 MED ORDER — INSULIN ASPART 100 UNIT/ML ~~LOC~~ SOLN: 0.0000 [IU] | Freq: Three times a day (TID) | SUBCUTANEOUS | Status: DC

## 2019-10-20 MED ORDER — NITROGLYCERIN 0.4 MG SL SUBL: 0.4000 mg | SUBLINGUAL_TABLET | SUBLINGUAL | Status: DC | PRN

## 2019-10-20 MED ORDER — ATORVASTATIN CALCIUM 80 MG PO TABS: 80.0000 mg | ORAL_TABLET | Freq: Every day | ORAL | Status: DC

## 2019-10-20 MED ORDER — INSULIN ASPART 100 UNIT/ML ~~LOC~~ SOLN
10.0000 [IU] | Freq: Three times a day (TID) | SUBCUTANEOUS | Status: DC
  Administered 2019-10-20: 13:00:00 10 [IU] via SUBCUTANEOUS
  Filled 2019-10-20: qty 1

## 2019-10-20 MED ORDER — INSULIN GLARGINE 100 UNIT/ML ~~LOC~~ SOLN
5.0000 [IU] | Freq: Every day | SUBCUTANEOUS | Status: DC
  Filled 2019-10-20: qty 0.05

## 2019-10-20 MED ORDER — ATORVASTATIN CALCIUM 80 MG PO TABS: 80.0000 mg | ORAL_TABLET | Freq: Every day | ORAL | 3 refills | Status: AC

## 2019-10-20 MED ORDER — PREGABALIN 25 MG PO CAPS
50.0000 mg | ORAL_CAPSULE | Freq: Three times a day (TID) | ORAL | Status: DC
  Administered 2019-10-23 – 2019-11-06 (×38): 50 mg via ORAL
  Filled 2019-10-20: qty 2
  Filled 2019-10-20 (×4): qty 1
  Filled 2019-10-20: qty 2
  Filled 2019-10-20 (×3): qty 1
  Filled 2019-10-20 (×3): qty 2
  Filled 2019-10-20: qty 1
  Filled 2019-10-20: qty 2
  Filled 2019-10-20 (×3): qty 1
  Filled 2019-10-20: qty 2
  Filled 2019-10-20: qty 1
  Filled 2019-10-20: qty 2
  Filled 2019-10-20: qty 1
  Filled 2019-10-20 (×2): qty 2
  Filled 2019-10-20: qty 1
  Filled 2019-10-20: qty 2
  Filled 2019-10-20 (×5): qty 1
  Filled 2019-10-20: qty 2
  Filled 2019-10-20 (×2): qty 1
  Filled 2019-10-20 (×6): qty 2
  Filled 2019-10-20: qty 1

## 2019-10-20 MED ORDER — FUROSEMIDE 10 MG/ML IJ SOLN: 40.0000 mg | Freq: Two times a day (BID) | INTRAMUSCULAR | 0 refills | Status: DC

## 2019-10-20 MED ORDER — INSULIN ASPART 100 UNIT/ML ~~LOC~~ SOLN: 10.0000 [IU] | Freq: Three times a day (TID) | SUBCUTANEOUS | 11 refills | Status: AC

## 2019-10-20 MED ORDER — INSULIN ASPART 100 UNIT/ML ~~LOC~~ SOLN: 0.0000 [IU] | Freq: Three times a day (TID) | SUBCUTANEOUS | 11 refills | Status: AC

## 2019-10-20 MED ORDER — HEPARIN (PORCINE) 25000 UT/250ML-% IV SOLN
1300.0000 [IU]/h | INTRAVENOUS | Status: DC
  Administered 2019-10-20: 1150 [IU]/h via INTRAVENOUS
  Administered 2019-10-21: 1250 [IU]/h via INTRAVENOUS
  Administered 2019-10-22 – 2019-10-25 (×4): 1300 [IU]/h via INTRAVENOUS
  Filled 2019-10-20 (×4): qty 250

## 2019-10-20 MED ORDER — HEPARIN (PORCINE) 25000 UT/250ML-% IV SOLN: 1150.0000 [IU]/h | INTRAVENOUS | Status: DC

## 2019-10-20 MED ORDER — ATORVASTATIN CALCIUM 80 MG PO TABS
80.0000 mg | ORAL_TABLET | Freq: Every day | ORAL | Status: DC
  Administered 2019-10-21 – 2019-11-06 (×15): 80 mg via ORAL
  Filled 2019-10-20 (×15): qty 1

## 2019-10-20 MED ORDER — INSULIN GLARGINE 100 UNIT/ML ~~LOC~~ SOLN: 5.0000 [IU] | Freq: Every day | SUBCUTANEOUS | 11 refills | Status: AC

## 2019-10-20 MED ORDER — ASPIRIN EC 81 MG PO TBEC
81.0000 mg | DELAYED_RELEASE_TABLET | Freq: Every day | ORAL | Status: DC
  Administered 2019-10-21 – 2019-10-25 (×5): 81 mg via ORAL
  Filled 2019-10-20 (×5): qty 1

## 2019-10-20 NOTE — Consult Note (Signed)
ANTICOAGULATION CONSULT NOTE  Pharmacy Consult for Heparin Indication: chest pain/ACS  Patient Measurements: Height: 5' 11"  (180.3 cm) Weight: 93.5 kg (206 lb 3.2 oz) IBW/kg (Calculated) : 75.3 Heparin Dosing Weight: 84.6 kg  Vital Signs: Temp: 97.7 F (36.5 C) (04/09 0742) Temp Source: Oral (04/09 0742) BP: 149/70 (04/09 0742) Pulse Rate: 73 (04/09 0742)  Labs: Recent Labs    10/18/19 1526 10/18/19 1526 10/18/19 1832 10/19/19 0418 10/19/19 1740 10/20/19 0726  HGB 14.3  --   --   --   --  13.3  HCT 39.4  --   --   --   --  37.0*  PLT 172  --   --   --   --  137*  APTT  --   --   --   --  36  --   LABPROT  --   --   --   --  13.3  --   INR  --   --   --   --  1.0  --   HEPARINUNFRC  --   --   --   --   --  0.32  CREATININE 1.47*  --   --   --   --  1.36*  TROPONINIHS 37*   < > 50* 33*  --  24*   < > = values in this interval not displayed.    Estimated Creatinine Clearance: 66.6 mL/min (A) (by C-G formula based on SCr of 1.36 mg/dL (H)).   Medical History: Past Medical History:  Diagnosis Date  . CHF (congestive heart failure) (HCC)    diastolic (echo at [facility] 7371) EF 55-60%  . Cirrhosis ([place])    NASH per records  . COPD (chronic obstructive pulmonary disease) ([place])   . Depression   . Diabetes mellitus with neuropathy ([place])   . Heart attack ([place])   . Neuropathy   . OSA (obstructive sleep apnea)   . Peripheral vascular disease due to secondary diabetes mellitus ([place])   . Splenomegaly   . Temporal giant cell arteritis ([place])   . TIA (transient ischemic attack) April 2016    Medications:  Medications Prior to Admission  Medication Sig Dispense Refill Last Dose  . acetaminophen (TYLENOL) 500 MG tablet Take 1,000 mg by mouth daily as needed.   prn at prn  . amLODipine (NORVASC) 5 MG tablet Take 5 mg by mouth daily.   10/18/2019 at 1000  . atorvastatin (LIPITOR) 40 MG tablet Take 40 mg by mouth daily.    10/18/2019 at 1000  . clopidogrel (PLAVIX) 75 MG tablet  Take 1 tablet (75 mg total) by mouth daily with breakfast. 30 tablet 2 10/18/2019 at 1000  . famotidine (PEPCID) 20 MG tablet Take 1 tablet (20 mg total) by mouth 2 (two) times daily. (Patient taking differently: Take 20 mg by mouth daily as needed. ) 8 tablet 0 prn at prn  . levETIRAcetam (KEPPRA) 500 MG tablet Take 500 mg by mouth 3 (three) times daily.   10/18/2019 at 1900  . losartan (COZAAR) 100 MG tablet Take 100 mg by mouth daily.   10/18/2019 at 1000  . metFORMIN (GLUCOPHAGE) 500 MG tablet Take 500 mg by mouth 2 (two) times daily with a meal.   10/18/2019 at 1900  . metoprolol succinate (TOPROL-XL) 100 MG 24 hr tablet Take 100 mg by mouth daily.   10/18/2019 at 1000  . nitroGLYCERIN (NITROSTAT) 0.4 MG SL tablet Place 0.4 mg under the tongue every 5 (five)  minutes as needed for chest pain.   prn at prn  . oxyCODONE (OXY IR/ROXICODONE) 5 MG immediate release tablet Take 5 mg by mouth every 8 (eight) hours as needed for pain.   prn at prn  . venlafaxine (EFFEXOR) 75 MG tablet Take 75 mg by mouth 3 (three) times daily.   10/18/2019 at 1900   Scheduled:  . amLODipine  5 mg Oral Daily  . aspirin  81 mg Oral Daily  . atorvastatin  40 mg Oral Daily  . furosemide  40 mg Intravenous Q12H  . insulin aspart  0-15 Units Subcutaneous TID PC & HS  . levETIRAcetam  500 mg Oral TID  . metoprolol succinate  100 mg Oral Daily  . pantoprazole  40 mg Oral Daily  . pregabalin  50 mg Oral TID  . sodium chloride flush  3 mL Intravenous Once  . venlafaxine  75 mg Oral TID   Infusions:  . heparin 1,150 Units/hr (10/20/19 0103)   PRN: acetaminophen, ALPRAZolam, labetalol, morphine injection, nitroGLYCERIN, ondansetron (ZOFRAN) IV, oxyCODONE, zolpidem Anti-infectives (From admission, onward)   None      Assessment: Pharmacy consulted for heparin for ACS. No DOAC noted. LHC showed multivessel CAD with recommendation for patient to transfer to [facility] on 4/9. The heparin was stopped during the [facility] and cardiology would  like to restart at midnight 10/20/19  Goal of Therapy:  Heparin level 0.3-0.7 units/ml Monitor platelets by anticoagulation protocol: Yes   Plan:   4/9@0726  HL 0.32: therapeutic. Will continue current heparin drip rate of 1150 units/hr.  Plan to transfer patient to [facility] later today. If patient is still here, will recheck confirmatory anti-Xa level in 6 hours(1330)  Continue to monitor H&H and platelets  Pearla Dubonnet, PharmD 10/20/2019,8:58 AM

## 2019-10-20 NOTE — Discharge Summary (Signed)
Physician Discharge Summary  Larry Terrell PTW:[id number] DOB: 1961/04/17 DOA: 10/18/2019  PCP: [facility], [place] date: 10/18/2019 Discharge date: 10/20/2019  Admitted From: home Disposition:  [facility]  Recommendations for Outpatient Follow-up:  1. Per discharging team at [facility]: n/a  Equipment/Devices: n/a   Discharge Condition: Stable  CODE STATUS: Full  Diet recommendation: Heart Healthy / Carb Modified   Brief/Interim Summary:   [name] a26 y.o.malewith a history of coronary artery disease status post MI and PCI with 2 stents, diastolic CHF, COPD, depression, diabetes mellitus and hypertension, presented to the ED on 10/18/19 with midsternal chest pain, intermittent over couple of days, radiating to the left shoulder.  Associated symptoms of dyspnea, palpitations, occasional orthopnea.  In the ED, hypertensive 162/93 --> 179/86, pulse oximetry 94% on room air and otherwise normal vital signs. Labs notable for high-sensitivity troponin 37 >> 50.  BNP 177. Blood glucose was 366 and BUN of 19 with creatinine 1.47 (from 22 and 1.32 on 06/07/2019). Two-view chest x-ray showed pulmonary vascular congestion. EKG showed normal sinus rhythm at 80 bpm with first-degree AV block and poor R wave progression, andT wave inversions inferiorly and laterally. The patient was given 4 baby aspirin and admitted to progressive cardiac unit.  Cardiology consulted.  Cardiac cath on 4/8 showed multivessel disease.  Patient is being transferred to [facility] for CABG.   Discharge Diagnoses: Active Problems:   NSTEMI (non-ST elevated myocardial infarction) [facility])   Chest pain    Discharge Instructions   Discharge Instructions    Diet - low sodium heart healthy   Complete by: As directed    Increase activity slowly   Complete by: As directed      Allergies as of 10/20/2019      Reactions   Gabapentin    Ace Inhibitors Cough   Other reaction(s):  Other (See Comments) cough      Medication List    STOP taking these medications   clopidogrel 75 MG tablet Commonly known as: PLAVIX   omeprazole 20 MG capsule Commonly known as: PRILOSEC Replaced by: pantoprazole 40 MG tablet     TAKE these medications   acetaminophen 500 MG tablet Commonly known as: TYLENOL Take 1,000 mg by mouth daily as needed.   amLODipine 5 MG tablet Commonly known as: NORVASC Take 5 mg by mouth daily.   aspirin 81 MG chewable tablet Chew 1 tablet (81 mg total) by mouth daily.   atorvastatin 40 MG tablet Commonly known as: LIPITOR Take 40 mg by mouth daily.   famotidine 20 MG tablet Commonly known as: Pepcid Take 1 tablet (20 mg total) by mouth 2 (two) times daily. What changed:   when to take this  reasons to take this   furosemide 10 MG/ML injection Commonly known as: LASIX Inject 4 mLs (40 mg total) into the vein every 12 (twelve) hours.   heparin 25000-0.45 UT/250ML-% infusion Inject 1,150 Units/hr into the vein continuous.   insulin aspart 100 UNIT/ML injection Commonly known as: novoLOG Inject 0-15 Units into the skin 4 (four) times daily - after meals and at bedtime.   insulin aspart 100 UNIT/ML injection Commonly known as: novoLOG Inject 10 Units into the skin 3 (three) times daily with meals.   insulin glargine 100 UNIT/ML injection Commonly known as: LANTUS Inject 0.05 mLs (5 Units total) into the skin daily.   levETIRAcetam 500 MG tablet Commonly known as: KEPPRA Take 500 mg by mouth 3 (  three) times daily.   losartan 100 MG tablet Commonly known as: COZAAR Take 100 mg by mouth daily.   metFORMIN 500 MG tablet Commonly known as: GLUCOPHAGE Take 500 mg by mouth 2 (two) times daily with a meal.   metoprolol succinate 100 MG 24 hr tablet Commonly known as: TOPROL-XL Take 100 mg by mouth daily.   nitroGLYCERIN 0.4 MG SL tablet Commonly known as: NITROSTAT Place 0.4 mg under the tongue every 5 (five) minutes as  needed for chest pain.   oxyCODONE 5 MG immediate release tablet Commonly known as: Oxy IR/ROXICODONE Take 5 mg by mouth every 8 (eight) hours as needed for pain.   pantoprazole 40 MG tablet Commonly known as: PROTONIX Take 1 tablet (40 mg total) by mouth daily. Replaces: omeprazole 20 MG capsule   pregabalin 50 MG capsule Commonly known as: Lyrica Take 1 capsule (50 mg total) by mouth 3 (three) times daily.   venlafaxine 75 MG tablet Commonly known as: EFFEXOR Take 75 mg by mouth 3 (three) times daily.       Allergies  Allergen Reactions  . Gabapentin   . Ace Inhibitors Cough    Other reaction(s): Other (See Comments) cough    Consultations:  cardiology    Procedures/Studies: DG Chest 2 View  Result Date: 10/18/2019 CLINICAL DATA:  Short of breath. Left arm pain and chest pain. EXAM: CHEST - 2 VIEW COMPARISON:  06/06/2019 FINDINGS: Normal heart size. No pleural effusion or edema. Pulmonary vascular congestion is identified bilaterally. Chronic interstitial coarsening is unchanged. No superimposed airspace consolidation. IMPRESSION: 1. Pulmonary vascular congestion. Electronically Signed   By: Kerby Moors M.D.   On: 10/18/2019 15:51   CARDIAC CATHETERIZATION  Result Date: 10/19/2019  Balloon angioplasty was performed.  Previously placed Mid Cx to Dist Cx drug eluting stent is widely patent.  Balloon angioplasty was performed.  Mid RCA lesion is 80% stenosed.  Mid RCA to Dist RCA lesion is 10% stenosed.  Prox Cx lesion is 100% stenosed.  Ost Ramus lesion is 80% stenosed.  Prox LAD lesion is 95% stenosed.  Dist LAD lesion is 45% stenosed.  Mid LM lesion is 30% stenosed.  Ost RPDA lesion is 80% stenosed.  The left ventricular ejection fraction is 50-55% by visual estimate.  LV end diastolic pressure is mildly elevated.  There is mild left ventricular systolic dysfunction.          Subjective: Patient reports feeling well this AM.  Mild intermittent chest  discomfort, more in shoulder.  No fever/chills or other new complaints.  Agreeable to transfer.   Discharge Exam: Vitals:   10/20/19 0515 10/20/19 0742  BP: (!) 145/73 (!) 149/70  Pulse: 75 73  Resp: 16 18  Temp: 97.7 F (36.5 C) 97.7 F (36.5 C)  SpO2: 97% 93%   Vitals:   10/19/19 1811 10/19/19 1930 10/20/19 0515 10/20/19 0742  BP:  140/68 (!) 145/73 (!) 149/70  Pulse:  71 75 73  Resp:  18 16 18   Temp:  97.9 F (36.6 C) 97.7 F (36.5 C) 97.7 F (36.5 C)  TempSrc:  Oral Oral Oral  SpO2:  95% 97% 93%  Weight: 90.5 kg  93.5 kg   Height: 5' 11"  (1.803 m)       General: Pt is alert, awake, not in acute distress Cardiovascular: RRR, S1/S2 +, no rubs, no gallops Respiratory: CTA bilaterally, no wheezing, no rhonchi Abdominal: Soft, NT, ND, bowel sounds + Extremities: no edema, no cyanosis    The  results of significant diagnostics from this hospitalization (including imaging, microbiology, ancillary and laboratory) are listed below for reference.     Microbiology: Recent Results (from the past 240 hour(s))  SARS CORONAVIRUS 2 (TAT 6-24 HRS) Nasopharyngeal Nasopharyngeal Swab     Status: None   Collection Time: 10/18/19  8:55 PM   Specimen: Nasopharyngeal Swab  Result Value Ref Range Status   SARS Coronavirus 2 NEGATIVE NEGATIVE Final    Comment: (NOTE) SARS-CoV-2 target nucleic acids are NOT DETECTED. The SARS-CoV-2 RNA is generally detectable in upper and lower respiratory specimens during the acute phase of infection. Negative results do not preclude SARS-CoV-2 infection, do not rule out co-infections with other pathogens, and should not be used as the sole basis for treatment or other patient management decisions. Negative results must be combined with clinical observations, patient history, and epidemiological information. The expected result is Negative. Fact Sheet for Patients: SugarRoll.be Fact Sheet for Healthcare  Providers: https://www.woods-mathews.com/ This test is not yet approved or cleared by the [place] FDA and  has been authorized for detection and/or diagnosis of SARS-CoV-2 by FDA under an Emergency Use Authorization (EUA). This EUA will remain  in effect (meaning this test can be used) for the duration of the COVID-19 declaration under Section 56 4(b)(1) of the Act, 21 U.S.C. section 360bbb-3(b)(1), unless the authorization is terminated or revoked sooner. Performed at [facility], [place] [address] [place], [place] [address]      Labs: BNP (last 3 results) Recent Labs    03/22/19 1016 06/04/19 1741 10/18/19 2055  BNP 78.0 219.0* 416.3*   Basic Metabolic Panel: Recent Labs  Lab 10/18/19 1526 10/20/19 0726  NA 133* 133*  K 4.6 4.2  CL 101 100  CO2 24 24  GLUCOSE 366* 292*  BUN 19 27*  CREATININE 1.47* 1.36*  CALCIUM 9.0 8.6*  MG  --  1.8   Liver Function Tests: Recent Labs  Lab 10/20/19 0726  AST 27  ALT 29  ALKPHOS 87  BILITOT 1.2  PROT 6.2*  ALBUMIN 3.4*   No results for input(s): LIPASE, AMYLASE in the last 168 hours. No results for input(s): AMMONIA in the last 168 hours. CBC: Recent Labs  Lab 10/18/19 1526 10/20/19 0726  WBC 8.9 8.8  HGB 14.3 13.3  HCT 39.4 37.0*  MCV 91.6 92.5  PLT 172 137*   Cardiac Enzymes: No results for input(s): CKTOTAL, CKMB, CKMBINDEX, TROPONINI in the last 168 hours. BNP: Invalid input(s): POCBNP CBG: Recent Labs  Lab 10/19/19 1124 10/19/19 1453 10/19/19 1742 10/19/19 2054 10/20/19 0826  GLUCAP 232* 233* 327* 392* 281*   D-Dimer No results for input(s): DDIMER in the last 72 hours. Hgb A1c Recent Labs    10/18/19 2256  HGBA1C 7.3*   Lipid Profile Recent Labs    10/19/19 0418  CHOL 160  HDL 23*  LDLCALC UNABLE TO CALCULATE IF TRIGLYCERIDE OVER 400 mg/dL  TRIG 482*  CHOLHDL 7.0  LDLDIRECT 72.0   Thyroid function studies No results for input(s): TSH, T4TOTAL, T3FREE,  THYROIDAB in the last 72 hours.  Invalid input(s): FREET3 Anemia work up No results for input(s): VITAMINB12, FOLATE, FERRITIN, TIBC, IRON, RETICCTPCT in the last 72 hours. Urinalysis    Component Value Date/Time   COLORURINE YELLOW (A) 06/04/2019 1741   APPEARANCEUR CLEAR (A) 06/04/2019 1741   APPEARANCEUR Clear 11/06/2014 1527   LABSPEC 1.014 06/04/2019 1741   LABSPEC 1.008 11/06/2014 1527   PHURINE 6.0 06/04/2019 1741   GLUCOSEU >=500 (  A) 06/04/2019 1741   GLUCOSEU Negative 11/06/2014 1527   HGBUR NEGATIVE 06/04/2019 1741   BILIRUBINUR NEGATIVE 06/04/2019 1741   BILIRUBINUR Negative 11/06/2014 1527   KETONESUR NEGATIVE 06/04/2019 1741   PROTEINUR 100 (A) 06/04/2019 1741   NITRITE NEGATIVE 06/04/2019 1741   LEUKOCYTESUR NEGATIVE 06/04/2019 1741   LEUKOCYTESUR Negative 11/06/2014 1527   Sepsis Labs Invalid input(s): PROCALCITONIN,  WBC,  LACTICIDVEN Microbiology Recent Results (from the past 240 hour(s))  SARS CORONAVIRUS 2 (TAT 6-24 HRS) Nasopharyngeal Nasopharyngeal Swab     Status: None   Collection Time: 10/18/19  8:55 PM   Specimen: Nasopharyngeal Swab  Result Value Ref Range Status   SARS Coronavirus 2 NEGATIVE NEGATIVE Final    Comment: (NOTE) SARS-CoV-2 target nucleic acids are NOT DETECTED. The SARS-CoV-2 RNA is generally detectable in upper and lower respiratory specimens during the acute phase of infection. Negative results do not preclude SARS-CoV-2 infection, do not rule out co-infections with other pathogens, and should not be used as the sole basis for treatment or other patient management decisions. Negative results must be combined with clinical observations, patient history, and epidemiological information. The expected result is Negative. Fact Sheet for Patients: SugarRoll.be Fact Sheet for Healthcare Providers: https://www.woods-mathews.com/ This test is not yet approved or cleared by the [place] FDA  and  has been authorized for detection and/or diagnosis of SARS-CoV-2 by FDA under an Emergency Use Authorization (EUA). This EUA will remain  in effect (meaning this test can be used) for the duration of the COVID-19 declaration under Section 56 4(b)(1) of the Act, 21 U.S.C. section 360bbb-3(b)(1), unless the authorization is terminated or revoked sooner. Performed at [place] [facility], [place] [address], [place], [place] [address]      Time coordinating discharge: Over 30 minutes  SIGNED:   Ezekiel Slocumb, DO [facility] 10/20/2019, 9:29 AM   If 7PM-7AM, please contact night-coverage [email or web address]

## 2019-10-20 NOTE — Consult Note (Signed)
ANTICOAGULATION CONSULT NOTE  Pharmacy Consult for Heparin Indication: chest pain/ACS  Patient Measurements:   Heparin Dosing Weight: 84.6 kg  Vital Signs: Temp: 97.9 F (36.6 C) (04/09 1809) Temp Source: Oral (04/09 1809) BP: 169/87 (04/09 1809) Pulse Rate: 67 (04/09 1809)  Labs: Recent Labs    10/18/19 1526 10/18/19 1526 10/18/19 1832 10/19/19 0418 10/19/19 1740 10/20/19 0726 10/20/19 1326  HGB 14.3  --   --   --   --  13.3  --   HCT 39.4  --   --   --   --  37.0*  --   PLT 172  --   --   --   --  137*  --   APTT  --   --   --   --  36  --   --   LABPROT  --   --   --   --  13.3  --   --   INR  --   --   --   --  1.0  --   --   HEPARINUNFRC  --   --   --   --   --  0.32 0.38  CREATININE 1.47*  --   --   --   --  1.36*  --   TROPONINIHS 37*   < > 50* 33*  --  24*  --    < > = values in this interval not displayed.    Estimated Creatinine Clearance: 66.6 mL/min (A) (by C-G formula based on SCr of 1.36 mg/dL (H)).   Medical History: Past Medical History:  Diagnosis Date  . CHF (congestive heart failure) (HCC)    diastolic (echo at [facility] 2409) EF 55-60%  . Cirrhosis ([place])    NASH per records  . COPD (chronic obstructive pulmonary disease) ([place])   . Depression   . Diabetes mellitus with neuropathy ([place])   . Heart attack ([place])   . Neuropathy   . OSA (obstructive sleep apnea)   . Peripheral vascular disease due to secondary diabetes mellitus ([place])   . Splenomegaly   . 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 TIA (transient ischemic attack) April 2016    Medications:  Medications Prior to Admission  Medication Sig Dispense Refill Last Dose  . acetaminophen (TYLENOL) 500 MG tablet Take 1,000 mg by mouth daily as needed.     Marland Kitchen amLODipine (NORVASC) 5 MG tablet Take 5 mg by mouth daily.     Marland Kitchen aspirin 81 MG chewable tablet Chew 1 tablet (81 mg total) by mouth daily. 30 tablet 1   . atorvastatin (LIPITOR) 40 MG tablet Take 40 mg by mouth daily.      Derrill Memo ON  10/21/2019] atorvastatin (LIPITOR) 80 MG tablet Take 1 tablet (80 mg total) by mouth daily. 90 tablet 3   . ezetimibe (ZETIA) 10 MG tablet Take 1 tablet (10 mg total) by mouth daily. 90 tablet 3   . famotidine (PEPCID) 20 MG tablet Take 1 tablet (20 mg total) by mouth 2 (two) times daily. (Patient taking differently: Take 20 mg by mouth daily as needed. ) 8 tablet 0   . furosemide (LASIX) 10 MG/ML injection Inject 4 mLs (40 mg total) into the vein every 12 (twelve) hours. 4 mL 0   . heparin 25000-0.45 UT/250ML-% infusion Inject 1,150 Units/hr into the vein continuous.     . insulin aspart (NOVOLOG) 100 UNIT/ML injection Inject 0-15 Units into the skin 4 (four) times  daily - after meals and at bedtime. 10 mL 11   . insulin aspart (NOVOLOG) 100 UNIT/ML injection Inject 10 Units into the skin 3 (three) times daily with meals. 10 mL 11   . insulin glargine (LANTUS) 100 UNIT/ML injection Inject 0.05 mLs (5 Units total) into the skin daily. 10 mL 11   . levETIRAcetam (KEPPRA) 500 MG tablet Take 500 mg by mouth 3 (three) times daily.     Marland Kitchen losartan (COZAAR) 100 MG tablet Take 100 mg by mouth daily.     . metFORMIN (GLUCOPHAGE) 500 MG tablet Take 500 mg by mouth 2 (two) times daily with a meal.     . metoprolol succinate (TOPROL-XL) 100 MG 24 hr tablet Take 100 mg by mouth daily.     . nitroGLYCERIN (NITROSTAT) 0.4 MG SL tablet Place 0.4 mg under the tongue every 5 (five) minutes as needed for chest pain.     Marland Kitchen oxyCODONE (OXY IR/ROXICODONE) 5 MG immediate release tablet Take 5 mg by mouth every 8 (eight) hours as needed for pain.     . pantoprazole (PROTONIX) 40 MG tablet Take 1 tablet (40 mg total) by mouth daily. 30 tablet 0   . pregabalin (LYRICA) 50 MG capsule Take 1 capsule (50 mg total) by mouth 3 (three) times daily. 90 capsule 2   . venlafaxine (EFFEXOR) 75 MG tablet Take 75 mg by mouth 3 (three) times daily.      Scheduled:  . [START ON 10/21/2019] amLODipine  5 mg Oral Daily  . [START ON  10/21/2019] aspirin EC  81 mg Oral Daily  . [START ON 10/21/2019] atorvastatin  80 mg Oral q1800  . insulin aspart  0-15 Units Subcutaneous TID PC & HS  . [START ON 10/21/2019] insulin glargine  5 Units Subcutaneous Daily  . levETIRAcetam  500 mg Oral TID  . [START ON 10/21/2019] metoprolol succinate  100 mg Oral Daily  . [START ON 10/21/2019] pantoprazole  40 mg Oral Daily  . pregabalin  50 mg Oral TID  . [START ON 10/21/2019] venlafaxine  75 mg Oral TID WC   Infusions:   PRN: acetaminophen, ALPRAZolam, nitroGLYCERIN, ondansetron (ZOFRAN) IV, oxyCODONE Anti-infectives (From admission, onward)   None      Assessment: 41 yom presenting with CP to [facility], started on heparin per pharmacy for ACS and transferred to [facility] for CABG evaluation after cath 4/8 showed severe 3-vessel CAD. Heparin was resumed post-cath 4/9 at midnight at [facility] and level was therapeutic on heparin at 1150 units/hr prior to transfer. Patient is not on anticoagulation PTA. Hg wnl, plt down to 137 today. RN confirms heparin is running at 1150 units/hr with no bleeding or issues with infusion. Will continue with daily heparin level monitoring based on therapeutic levels at [facility].   Goal of Therapy:  Heparin level 0.3-0.7 units/ml Monitor platelets by anticoagulation protocol: Yes   Plan:  Continue heparin drip rate of 1150 units/hr Monitor daily heparin level and CBC, s/sx bleeding F/u CABG work-up   Arturo Morton, PharmD, BCPS Please check AMION for all [place] contact numbers Clinical Pharmacist 10/20/2019 7:34 PM

## 2019-10-20 NOTE — H&P (Addendum)
Cardiology Admission History and Physical:   Patient ID: Larry Terrell MRN: [id number]; DOB: [birth year]   Admission date: (Not on file)  Primary Care Provider: Medicine, [place] Primary Cardiologist: [facility] Primary Electrophysiologist:  None   Chief Complaint:  Left shoulder pain  Patient Profile:   Larry Terrell is a 59 y.o. male with two-vessel CAD with prior MI status post stenting to the RCA and LCx as detailed below, HFpEF, TIA, PAD status post aortobifemoral bypass in 2003 with known stenosis of the bilateral SFAs, TIA, DM2 with diabetic polyneuropathy, HTN, HLD, seizure disorder, chronic back pain, and prior tobacco use who was evaluated at [facility] on 4/8 with left shoulder pain concerning for anginal equivalent and underwent diagnostic LHC which revealed severe multi-vessel CAD with recommendation for transfer to [facility] for evaluation of CABG.   History of Present Illness:   Larry Terrell has history of MI with remote stenting to the RCA and LCx with presenting symptoms at that time being left shoulder pain.  He was admitted to the hospital in 07/2017 with chest pain and minimal troponin elevation of 0.15.  Echo at that time showed EF 55 to 60%, no regional wall motion abnormalities, grade 2 diastolic dysfunction, mild mitral regurgitation, moderately dilated left atrium, normal RVSF.  He underwent diagnostic cath on 08/09/2017 which showed significant two-vessel CAD with patent RCA stents with moderate ISR and severe subocclusive ISR in the LCx.  He underwent successful balloon angioplasty and DES to the LCx to cover from the distal to proximal area with 1 long stent.  It appears he was lost to cardiology follow-up after this.  He has most recently been seen by vascular surgery at [facility] for his underlying PVD with worsening lifestyle limiting short distance claudication with noninvasive imaging showing decreased ABIs especially on the right with recommendation for possible surgical  intervention.  He was admitted to [facility] on 4/7 with a several day history of left shoulder discomfort that felt very similar to his presentation during his MIs.  No frank chest pain, dyspnea, palpitations, dizziness, presyncope, syncope, lower extremity swelling, PND, or early satiety.  He had stable two-pillow orthopnea.  EKG showed NSR with prior inferior MI as below.  CXR showed pulmonary vascular congestion. Labs showed high sensitivity troponin 37-->50-->33, BNP 177, HGB 14.3, PLT 172, potassium 4.6, BUN 19, SCr 1.47.  He received ASA 324 mg.  At time of cardiology consult in the ED, he continued to note left shoulder pain. He was started on heparin gtt at that time and subsequently underwent LHC on 4/8 which showed severe multi-vessel CAD including severe proximal LAD disease, severe mid RCA ISR with distal RCA disease, and an occluded proximal to mid LCx with in-stent occlusion. Case was discussed with interventional cardiology with recommendation for transfer to [facility] for evaluation of CABG. 2D echo is pending. He was restarted on heparin gtt on the evening of 4/8. Plavix was held with his last dose being in the morning of 4/7.   Past Medical History:  Diagnosis Date   CHF (congestive heart failure) (HCC)    diastolic (echo at [facility] 9753) EF 55-60%   Cirrhosis (HCC)    NASH per records   COPD (chronic obstructive pulmonary disease) ([place])    Depression    Diabetes mellitus with neuropathy ([place])    Heart attack ([place])    Neuropathy    OSA (obstructive sleep apnea)    Peripheral vascular disease due to secondary diabetes mellitus ([place])  Splenomegaly    Temporal giant cell arteritis (HCC)    TIA (transient ischemic attack) April 2016    Past Surgical History:  Procedure Laterality Date   AORTA - FEMORAL ARTERY BYPASS GRAFT Bilateral    CARDIAC CATHETERIZATION     cardiac stents  July 2015    CORONARY STENT INTERVENTION N/A 08/09/2017   Procedure: CORONARY STENT  INTERVENTION;  Surgeon: Wellington Hampshire, MD;  Location: [place] CV [facility];  Service: Cardiovascular;  Laterality: N/A;   LAPAROSCOPIC APPENDECTOMY N/A 07/10/2015   Procedure: APPENDECTOMY LAPAROSCOPIC;  Surgeon: Rolm Bookbinder, MD;  Location: [place];  Service: General;  Laterality: N/A;   LEFT HEART CATH AND CORONARY ANGIOGRAPHY N/A 08/09/2017   Procedure: LEFT HEART CATH AND CORONARY ANGIOGRAPHY;  Surgeon: Wellington Hampshire, MD;  Location: [place] CV [facility];  Service: Cardiovascular;  Laterality: N/A;   LEFT HEART CATH AND CORONARY ANGIOGRAPHY N/A 10/19/2019   Procedure: LEFT HEART CATH AND CORONARY ANGIOGRAPHY;  Surgeon: Minna Merritts, MD;  Location: [place] CV LAB;  Service: Cardiovascular;  Laterality: N/A;     Medications Prior to Admission: Prior to Admission medications   Medication Sig Start Date End Date Taking? Authorizing Provider  acetaminophen (TYLENOL) 500 MG tablet Take 1,000 mg by mouth daily as needed.    [provider]  amLODipine (NORVASC) 5 MG tablet Take 5 mg by mouth daily. 09/04/19 09/03/20  [provider]  atorvastatin (LIPITOR) 40 MG tablet Take 40 mg by mouth daily.     [provider]  clopidogrel (PLAVIX) 75 MG tablet Take 1 tablet (75 mg total) by mouth daily with breakfast. 08/11/17   Fritzi Mandes, MD  famotidine (PEPCID) 20 MG tablet Take 1 tablet (20 mg total) by mouth 2 (two) times daily. Patient taking differently: Take 20 mg by mouth daily as needed.  12/26/17   Paulette Blanch, MD  levETIRAcetam (KEPPRA) 500 MG tablet Take 500 mg by mouth 3 (three) times daily. 10/03/19   [provider]  losartan (COZAAR) 100 MG tablet Take 100 mg by mouth daily. 05/11/19   [provider]  metFORMIN (GLUCOPHAGE) 500 MG tablet Take 500 mg by mouth 2 (two) times daily with a meal.    [provider]  metoprolol succinate (TOPROL-XL) 100 MG 24 hr tablet Take 100 mg by mouth daily. 05/11/19   [provider]  nitroGLYCERIN (NITROSTAT) 0.4 MG SL tablet Place 0.4 mg under the tongue every 5 (five) minutes as needed for chest pain.    [provider]  oxyCODONE (OXY IR/ROXICODONE) 5 MG immediate release tablet Take 5 mg by mouth every 8 (eight) hours as needed for pain. 05/18/19   [provider]  venlafaxine (EFFEXOR) 75 MG tablet Take 75 mg by mouth 3 (three) times daily. 05/11/19   [provider]     Allergies:    Allergies  Allergen Reactions   Gabapentin    Ace Inhibitors Cough    Other reaction(s): Other (See Comments) cough    Social History:   Social History   Socioeconomic History   Marital status: Divorced    Spouse name: Not on file   Number of children: Not on file   Years of education: Not on file   Highest education level: Not on file  Occupational History   Not on file  Tobacco Use   Smoking status: Former Smoker    Types: Cigarettes    Quit date: 2009    Years since quitting:  12.2   Smokeless tobacco: Never Used  Substance and Sexual Activity   Alcohol use: No    Alcohol/week: 0.0 standard drinks   Drug use: No   Sexual activity: Not on file  Other Topics Concern   Not on file  Social History Narrative   Not on file   Social Determinants of Health   Financial Resource Strain:    Difficulty of Paying Living Expenses:   Food Insecurity:    Worried About Charity fundraiser in the Last Year:    Arboriculturist in the Last Year:   Transportation Needs:    Film/video editor (Medical):    Lack of Transportation (Non-Medical):   Physical Activity:    Days of Exercise per Week:    Minutes of Exercise per Session:   Stress:    Feeling of Stress :   Social Connections:    Frequency of Communication with Friends and Family:    Frequency of Social Gatherings with Friends and Family:    Attends Religious Services:    Active Member of Clubs or Organizations:    Attends Programme researcher, broadcasting/film/video:    Marital Status:   Intimate Partner Violence:    Fear of Current or Ex-Partner:    Emotionally Abused:    Physically Abused:    Sexually Abused:     Family History:   The patient's family history includes Alzheimer's disease in his father; Heart attack in his father; Heart attack (age of onset: 24) in his mother; Stroke in his mother.    ROS:  Please see the history of present illness.  All other ROS reviewed and negative.     Physical Exam/Data:   Vitals:   10/19/19 1811 10/19/19 1930 10/20/19 0515 10/20/19 0742  BP:  140/68 (!) 145/73 (!) 149/70  Pulse:  71 75 73  Resp:  18 16 18   Temp:  97.9 F (36.6 C) 97.7 F (36.5 C) 97.7 F (36.5 C)  TempSrc:  Oral Oral Oral  SpO2:  95% 97% 93%  Weight: 90.5 kg  93.5 kg   Height: 5' 11"  (1.803 m)       Intake/Output Summary (Last 24 hours) at 10/20/2019 0850 Last data filed at 10/20/2019 0744    Gross per 24 hour  Intake 319.33 ml  Output 2000 ml  Net -1680.67 ml        Filed Weights   10/19/19 1435 10/19/19 1811 10/20/19 0515  Weight: 89.4 kg 90.5 kg 93.5 kg     General:  Well nourished, well developed, in no acute distress HEENT: Normal Lymph: No adenopathy Neck: No JVD Endocrine:  No thryomegaly Vascular: No carotid bruits; FA pulses 2+ bilaterally without bruits  Cardiac:  Normal S1, S2; RRR; no murmur. Right femoral cardiac cath site is without active bleeding, ecchymosis, warmth, swelling, or TTP. No bruit Lungs:  Vlear to auscultation bilaterally, no wheezing, rhonchi or rales  Abd: Soft, nontender, no hepatomegaly  Ext: No edema Musculoskeletal:  No deformities, BUE and BLE strength normal and equal Skin: Warm and dry  Neuro:  CNs 2-12 intact, no focal abnormalities noted Psych:  Normal affect    EKG:  The ECG that was done 10/18/2019 was personally reviewed and demonstrates NSR, 80 bpm, first-degree AV block, prior inferior infarct, inferior T wave inversion,  PAC  Relevant CV Studies:  LHC 10/19/2019: Coronary dominance: Right  Left mainstem: Large vessel that bifurcates into the LAD and left circumflex, no significant disease noted, unable  to exclude mild distal left main disease, appears hazy  Left anterior descending (LAD): Large vessel that extends to the apical region, diagonal Lowella Kindley 2 of moderate size, severe proximal LAD disease at the takeoff of the diagonal estimated 95%, mild distal LAD disease  Left circumflex (LCx): Large vessel with OM Von Quintanar 2, proximal to mid left circumflex is occluded with in-stent restenosis OM seen distally, fills via collaterals High ramus with severe ostial disease estimated 90%  Right coronary artery (RCA): Right dominant vessel with PL and PDA, severe in-stent restenosis of mid vessel stent, severe ostial PDA disease  Left ventriculography: Left ventricular systolic function is low normal, LVEF is estimated at 50%, there is no significant mitral regurgitation , no significant aortic valve stenosis Mild inferior wall hypokinesis  Final Conclusions:  Severe three-vessel coronary disease including Severe proximal LAD Severe mid RCA, in-stent restenosis , distal RCA Occluded proximal to mid left circumflex in-stent occlusion Severe ostial ramus disease Mild inferior wall hypokinesis  Recommendations:  Case discussed with interventional cardiology Given he has not been responding well to stenting as noted by occluded left circumflex stent, in-stent restenosis on the right in the setting of diabetes, would likely be a good candidate for CABG Given the severity of his symptoms and disease, will transfer to [facility] for further discussion and evaluation  Wall Motion       Resting               Left Heart  Left Ventricle The left ventricular size is normal. There is mild left ventricular systolic dysfunction. LV end diastolic pressure is mildly elevated. The left ventricular ejection  fraction is 50-55% by visual estimate. There are LV function abnormalities.  Coronary Diagrams  Diagnostic Dominance: Right  _______________  2D echo pending    Laboratory Data:  High Sensitivity Troponin:   Recent Labs  Lab 10/18/19 1526 10/18/19 1832 10/19/19 0418 10/20/19 0726  TROPONINIHS 37* 50* 33* 24*      Chemistry Recent Labs  Lab 10/18/19 1526 10/20/19 0726  NA 133* 133*  K 4.6 4.2  CL 101 100  CO2 24 24  GLUCOSE 366* 292*  BUN 19 27*  CREATININE 1.47* 1.36*  CALCIUM 9.0 8.6*  GFRNONAA 51* 56*  GFRAA 59* >60  ANIONGAP 8 9    Recent Labs  Lab 10/20/19 0726  PROT 6.2*  ALBUMIN 3.4*  AST 27  ALT 29  ALKPHOS 87  BILITOT 1.2   Hematology Recent Labs  Lab 10/18/19 1526 10/20/19 0726  WBC 8.9 8.8  RBC 4.30 4.00*  HGB 14.3 13.3  HCT 39.4 37.0*  MCV 91.6 92.5  MCH 33.3 33.3  MCHC 36.3* 35.9  RDW 14.9 15.2  PLT 172 137*   BNP Recent Labs  Lab 10/18/19 2055  BNP 177.0*    DDimer No results for input(s): DDIMER in the last 168 hours.   Radiology/Studies:    CXR 10/18/2019: IMPRESSION: 1. Pulmonary vascular congestion.       TIMI Risk Score for Unstable Angina or Non-ST Elevation MI:   The patient's TIMI risk score is 6, which indicates a 41% risk of all cause mortality, new or recurrent myocardial infarction or need for urgent revascularization in the next 14 days.   Assessment and Plan:   CAD involving the native coronary arteries with unstable angina: -Symptom free -LHC 10/19/2019, showed severe 3-vessel CAD with severe proximal LAD stenosis, in-stent restenosis of the mid RCA as well as distal RCA disease, occluded proximal to mid LCx  with ISR and severe ostial ramus disease as outlined above -Transfer to [facility] for evaluation of possible CABG, Dr. Margaretann Loveless is accepting  -Heparin drip -ASA -Hold Plavix, last dose on morning of 10/18/2019 -Lipitor -TTE has been performed at [facility] with read pending at this time  2.PVD  with lifestyle limiting claudication: -Status post aortobifemoral bypass in 2003 with known stenosis of the bilateral SFAs -Followed by [facility] with plans for potential surgical intervention in the near future -ASA -Plavix held as above -Lipitor  3.HTN: -BP improved from presentation  -Toprol -Amlodipine -Escalate antihypertensive therapy as indicated  4.HLD: -Direct LDL 72 this admission with goal LDL being less than 70 -Titrate atorvastatin to 80 mg daily  -Recheck FLP and LFT as an outpatient in ~ 8 weeks, if LDL remains above goal at that time add Zetia  5.HFpEF: -Appears euvolemic  -Echo pending -Optimal BP control -Daily weights -Strict I's and O's  6. DM2 with diabetic polyneuropathy: -SSI -Metformin on hold -PTA Lyrica   7. Seizure disorder: -PTA Keppra   8. Depression: -PTA Effexor  9. GERD: -Protonix   10. Back pain: -PTA oxycodone   11. DVT PPX: -Heparin gtt   Severity of Illness: The appropriate patient status for this patient is INPATIENT. Inpatient status is judged to be reasonable and necessary in order to provide the required intensity of service to ensure the patient's safety. The patient's presenting symptoms, physical exam findings, and initial radiographic and laboratory data in the context of their chronic comorbidities is felt to place them at high risk for further clinical deterioration. Furthermore, it is not anticipated that the patient will be medically stable for discharge from the hospital within 2 midnights of admission. The following factors support the patient status of inpatient.   " The patient's presenting symptoms include left shoulder pain concerning for anginal equivalent. " The worrisome physical exam findings include continued pain at time of initial evaluation not consistent with MSK etiology. " The initial radiographic and laboratory data are worrisome because of elevated troponin. " The chronic co-morbidities include  known CAD, DM, HTN, HLD, prior tobacco use.   * I certify that at the point of admission it is my clinical judgment that the patient will require inpatient hospital care spanning beyond 2 midnights from the point of admission due to high intensity of service, high risk for further deterioration and high frequency of surveillance required.*    For questions or updates, please contact [place] Please consult [email or web address] for contact info under    Signed, Christell Faith, PA-C  10/20/2019 9:43 AM   Attending note  Patient seen independently, all data from [facility] reviewed. 59 yo male history of CAD with prior MI and stenting, chronic diastolic HF< COPD, DM2, HTN admitted with chest pain. hstrop up to 50 and trended down. Cath showed severe multivessel disease as reported above, he was transferred to [facility] to be evaluated for CABG.  K 4.2 Cr 1.36 BUN 27 Mg 1.8 WBC 8.8 Hgb 13.3 Plt 137   10/2019 echo LVEF 55%, no WMAs, grade II DDX, normal RV function  10/2019 cath  Balloon angioplasty was performed.  Previously placed Mid Cx to Dist Cx drug eluting stent is widely patent.  Balloon angioplasty was performed.  Mid RCA lesion is 80% stenosed.  Mid RCA to Dist RCA lesion is 10% stenosed.  Prox Cx lesion is 100% stenosed.  Ost Ramus lesion is 80% stenosed.  Prox LAD lesion is 95% stenosed.  Dist LAD  lesion is 45% stenosed.  Mid LM lesion is 30% stenosed.  Ost RPDA lesion is 80% stenosed.  The left ventricular ejection fraction is 50-55% by visual estimate.  LV end diastolic pressure is mildly elevated.  There is mild left ventricular systolic dysfunction.   Will need to consult CT surgery tomorrow AM. Continue medical therapy with ASA, atorva 80, toprol 100, hep gtt.  - Last plavix dose 10/18/19 - home ARB held due to some elevation in Cr. Lying comfortably in bed pain free, normal hemodynamics.     Carlyle Dolly MD

## 2019-10-20 NOTE — Consult Note (Signed)
ANTICOAGULATION CONSULT NOTE  Pharmacy Consult for Heparin Indication: chest pain/ACS  Patient Measurements: Height: 5' 11"  (180.3 cm) Weight: 93.5 kg (206 lb 3.2 oz) IBW/kg (Calculated) : 75.3 Heparin Dosing Weight: 84.6 kg  Vital Signs: Temp: 97.7 F (36.5 C) (04/09 0742) Temp Source: Oral (04/09 0742) BP: 161/71 (04/09 1206) Pulse Rate: 74 (04/09 1206)  Labs: Recent Labs    10/18/19 1526 10/18/19 1526 10/18/19 1832 10/19/19 0418 10/19/19 1740 10/20/19 0726 10/20/19 1326  HGB 14.3  --   --   --   --  13.3  --   HCT 39.4  --   --   --   --  37.0*  --   PLT 172  --   --   --   --  137*  --   APTT  --   --   --   --  36  --   --   LABPROT  --   --   --   --  13.3  --   --   INR  --   --   --   --  1.0  --   --   HEPARINUNFRC  --   --   --   --   --  0.32 0.38  CREATININE 1.47*  --   --   --   --  1.36*  --   TROPONINIHS 37*   < > 50* 33*  --  24*  --    < > = values in this interval not displayed.    Estimated Creatinine Clearance: 66.6 mL/min (A) (by C-G formula based on SCr of 1.36 mg/dL (H)).   Medical History: Past Medical History:  Diagnosis Date  . CHF (congestive heart failure) (HCC)    diastolic (echo at [facility]17) EF 55-60%  . Cirrhosis ([place])    NASH per records  . COPD (chronic obstructive pulmonary disease) ([place])   . Depression   . Diabetes mellitus with neuropathy ([place])   . Heart attack ([place])   . Neuropathy   . OSA (obstructive sleep apnea)   . Peripheral vascular disease due to secondary diabetes mellitus ([place])   . Splenomegaly   . Temporal giant cell arteritis ([place])   . TIA (transient ischemic attack) April 2016    Medications:  Medications Prior to Admission  Medication Sig Dispense Refill Last Dose  . acetaminophen (TYLENOL) 500 MG tablet Take 1,000 mg by mouth daily as needed.   prn at prn  . amLODipine (NORVASC) 5 MG tablet Take 5 mg by mouth daily.   10/18/2019 at 1000  . atorvastatin (LIPITOR) 40 MG tablet Take 40 mg by mouth daily.     10/18/2019 at 1000  . clopidogrel (PLAVIX) 75 MG tablet Take 1 tablet (75 mg total) by mouth daily with breakfast. 30 tablet 2 10/18/2019 at 1000  . famotidine (PEPCID) 20 MG tablet Take 1 tablet (20 mg total) by mouth 2 (two) times daily. (Patient taking differently: Take 20 mg by mouth daily as needed. ) 8 tablet 0 prn at prn  . levETIRAcetam (KEPPRA) 500 MG tablet Take 500 mg by mouth 3 (three) times daily.   10/18/2019 at 1900  . losartan (COZAAR) 100 MG tablet Take 100 mg by mouth daily.   10/18/2019 at 1000  . metFORMIN (GLUCOPHAGE) 500 MG tablet Take 500 mg by mouth 2 (two) times daily with a meal.   10/18/2019 at 1900  . metoprolol succinate (TOPROL-XL) 100 MG 24 hr tablet Take  100 mg by mouth daily.   10/18/2019 at 1000  . nitroGLYCERIN (NITROSTAT) 0.4 MG SL tablet Place 0.4 mg under the tongue every 5 (five) minutes as needed for chest pain.   prn at prn  . oxyCODONE (OXY IR/ROXICODONE) 5 MG immediate release tablet Take 5 mg by mouth every 8 (eight) hours as needed for pain.   prn at prn  . venlafaxine (EFFEXOR) 75 MG tablet Take 75 mg by mouth 3 (three) times daily.   10/18/2019 at 1900   Scheduled:  . amLODipine  5 mg Oral Daily  . aspirin  81 mg Oral Daily  . [START ON 10/21/2019] atorvastatin  80 mg Oral Daily  . ezetimibe  10 mg Oral Daily  . furosemide  40 mg Intravenous Q12H  . insulin aspart  0-15 Units Subcutaneous TID PC & HS  . insulin aspart  10 Units Subcutaneous TID WC  . insulin glargine  5 Units Subcutaneous Daily  . levETIRAcetam  500 mg Oral TID  . metoprolol succinate  100 mg Oral Daily  . pantoprazole  40 mg Oral Daily  . pregabalin  50 mg Oral TID  . sodium chloride flush  3 mL Intravenous Once  . venlafaxine  75 mg Oral TID   Infusions:  . heparin 1,150 Units/hr (10/20/19 0103)   PRN: acetaminophen, ALPRAZolam, labetalol, morphine injection, nitroGLYCERIN, ondansetron (ZOFRAN) IV, oxyCODONE, zolpidem Anti-infectives (From admission, onward)   None       Assessment: Pharmacy consulted for heparin for ACS. No DOAC noted. LHC showed multivessel CAD with recommendation for patient to transfer to [facility] on 4/9. The heparin was stopped during the [facility] and cardiology would like to restart at midnight 10/20/19  4/9@0726  HL 0.32: therapeutic. Will continue current heparin drip rate of 1150 units/hr.  Goal of Therapy:  Heparin level 0.3-0.7 units/ml Monitor platelets by anticoagulation protocol: Yes   Plan:   4/9@1326  HL 0.38: therapeutic x2. Will continue heparin drip rate of 1150 units/hr  Plan to transfer patient to [facility] later today. If patient is still here, will recheck HL with AM labs.  Continue to monitor H&H and platelets  Pearla Dubonnet, PharmD 10/20/2019,2:03 PM

## 2019-10-20 NOTE — Progress Notes (Signed)
Patient reports he no longer takes lyrica.  He takes oxy for his leg pain.

## 2019-10-20 NOTE — Progress Notes (Addendum)
Progress Note  Patient Name: Larry Terrell Date of Encounter: 10/20/2019  Primary Cardiologist: [facility]  Subjective   No chest pain, dyspnea, palpitations, dizziness, presyncope, or syncope. Post cath labs stable. Awaiting transfer to [facility] for evaluation of possible CABG. On heparin gtt. Last dose of Plavix in the morning of 10/19/2019.   Inpatient Medications    Scheduled Meds: . amLODipine  5 mg Oral Daily  . aspirin  81 mg Oral Daily  . atorvastatin  40 mg Oral Daily  . furosemide  40 mg Intravenous Q12H  . insulin aspart  0-15 Units Subcutaneous TID PC & HS  . levETIRAcetam  500 mg Oral TID  . metoprolol succinate  100 mg Oral Daily  . pantoprazole  40 mg Oral Daily  . pregabalin  50 mg Oral TID  . sodium chloride flush  3 mL Intravenous Once  . venlafaxine  75 mg Oral TID   Continuous Infusions: . heparin 1,150 Units/hr (10/20/19 0103)   PRN Meds: acetaminophen, ALPRAZolam, labetalol, morphine injection, nitroGLYCERIN, ondansetron (ZOFRAN) IV, oxyCODONE, zolpidem   Vital Signs    Vitals:   10/19/19 1811 10/19/19 1930 10/20/19 0515 10/20/19 0742  BP:  140/68 (!) 145/73 (!) 149/70  Pulse:  71 75 73  Resp:  18 16 18   Temp:  97.9 F (36.6 C) 97.7 F (36.5 C) 97.7 F (36.5 C)  TempSrc:  Oral Oral Oral  SpO2:  95% 97% 93%  Weight: 90.5 kg  93.5 kg   Height: 5' 11"  (1.803 m)       Intake/Output Summary (Last 24 hours) at 10/20/2019 0850 Last data filed at 10/20/2019 0744 Gross per 24 hour  Intake 319.33 ml  Output 2000 ml  Net -1680.67 ml   Filed Weights   10/19/19 1435 10/19/19 1811 10/20/19 0515  Weight: 89.4 kg 90.5 kg 93.5 kg    Telemetry    SR with rare PAC, 1st degree AV block - Personally Reviewed  ECG    No new tracings - Personally Reviewed  Physical Exam   GEN: No acute distress.   Neck: No JVD. Cardiac: RRR, no murmurs, rubs, or gallops. Right femoral cardiac cath site is without active bleeding, ecchymosis, warmth, swelling, or TTP.  No bruit.  Respiratory: Clear to auscultation bilaterally.  GI: Soft, nontender, non-distended.   MS: No edema; No deformity. Neuro:  Alert and oriented x 3; Nonfocal.  Psych: Normal affect.  Labs    Chemistry Recent Labs  Lab 10/18/19 1526 10/20/19 0726  NA 133* 133*  K 4.6 4.2  CL 101 100  CO2 24 24  GLUCOSE 366* 292*  BUN 19 27*  CREATININE 1.47* 1.36*  CALCIUM 9.0 8.6*  PROT  --  6.2*  ALBUMIN  --  3.4*  AST  --  27  ALT  --  29  ALKPHOS  --  87  BILITOT  --  1.2  GFRNONAA 51* 56*  GFRAA 59* >60  ANIONGAP 8 9     Hematology Recent Labs  Lab 10/18/19 1526 10/20/19 0726  WBC 8.9 8.8  RBC 4.30 4.00*  HGB 14.3 13.3  HCT 39.4 37.0*  MCV 91.6 92.5  MCH 33.3 33.3  MCHC 36.3* 35.9  RDW 14.9 15.2  PLT 172 137*    Cardiac EnzymesNo results for input(s): TROPONINI in the last 168 hours. No results for input(s): TROPIPOC in the last 168 hours.   BNP Recent Labs  Lab 10/18/19 2055  BNP 177.0*  DDimer No results for input(s): DDIMER in the last 168 hours.   Radiology    DG Chest 2 View  Result Date: 10/18/2019 IMPRESSION: 1. Pulmonary vascular congestion. Electronically Signed   By: Kerby Moors M.D.   On: 10/18/2019 15:51   Cardiac Studies   LHC 10/19/2019: Coronary dominance: Right  Left mainstem:   Large vessel that bifurcates into the LAD and left circumflex, no significant disease noted, unable to exclude mild distal left main disease, appears hazy  Left anterior descending (LAD):   Large vessel that extends to the apical region, diagonal branch 2 of moderate size, severe proximal LAD disease at the takeoff of the diagonal estimated 95%, mild distal LAD disease  Left circumflex (LCx):  Large vessel with OM branch 2, proximal to mid left circumflex is occluded with in-stent restenosis OM seen distally, fills via collaterals High ramus with severe ostial disease estimated 90%  Right coronary artery (RCA):  Right dominant vessel with PL and  PDA, severe in-stent restenosis of mid vessel stent, severe ostial PDA disease  Left ventriculography: Left ventricular systolic function is low normal, LVEF is estimated at 50%, there is no significant mitral regurgitation , no significant aortic valve stenosis Mild inferior wall hypokinesis  Final Conclusions:   Severe three-vessel coronary disease including Severe proximal LAD Severe mid RCA, in-stent restenosis , distal RCA Occluded proximal to mid left circumflex in-stent occlusion Severe ostial ramus disease Mild inferior wall hypokinesis  Recommendations:  Case discussed with interventional cardiology Given he has not been responding well to stenting as noted by occluded left circumflex stent, in-stent restenosis on the right in the setting of diabetes, would likely be a good candidate for CABG Given the severity of his symptoms and disease, will transfer to [facility] for further discussion and evaluation  Wall Motion  Resting               Left Heart  Left Ventricle The left ventricular size is normal. There is mild left ventricular systolic dysfunction. LV end diastolic pressure is mildly elevated. The left ventricular ejection fraction is 50-55% by visual estimate. There are LV function abnormalities.  Coronary Diagrams  Diagnostic Dominance: Right   _______________  2D echo pending  Patient Profile     59 y.o. male with history of two-vessel CAD with prior MI status post stenting to the RCA and LCx as detailed below, HFpEF, TIA, PAD status post aortobifemoral bypass in 2003 with known stenosis of the bilateral SFAs, DM2, HTN, HLD, and prior tobacco use who is being seen today for the evaluation of chest pain at the request of Dr. Sidney Ace.  Assessment & Plan    1. CAD involving the native coronary arteries with unstable angina: -Symptom free -LHC 10/19/2019, showed severe 3-vessel CAD with severe proximal LAD stenosis, in-stent restenosis of the mid RCA as well as  distal RCA disease, occluded proximal to mid LCx with ISR and severe ostial ramus disease as outlined above -Transfer to [facility] for evaluation of possible CABG, Dr. Margaretann Loveless is accepting  -Heparin drip -ASA -Hold Plavix, last dose on morning of 10/18/2019 -Lipitor -TTE has been performed at [facility] with read pending at this time  2.  PVD with lifestyle limiting claudication: -Followed by [facility] with plans for potential surgical intervention in the near future -ASA -Plavix held as above -Lipitor  3.  HTN: -BP improved from presentation  -Toprol -Amlodipine -Escalate antihypertensive therapy as indicated  4.  HLD: -Direct LDL 72 this admission with goal  LDL being less than 70 -Titrate atorvastatin to 80 mg daily  -Recheck FLP and LFT as an outpatient in ~ 8 weeks, if LDL remains above goal at that time add Zetia  5.  HFpEF: -Appears euvolemic  -Optimal BP control -Daily weights -Strict I's and O's   For questions or updates, please contact [facility] Please consult [email or web address] for contact info under Cardiology/STEMI.    Signed, Christell Faith, PA-C [place] Pager: [phone] 10/20/2019, 8:50 AM

## 2019-10-20 NOTE — Progress Notes (Signed)
*  PRELIMINARY RESULTS* Echocardiogram 2D Echocardiogram has been performed.  Larry Terrell 10/20/2019, 8:46 AM

## 2019-10-21 DIAGNOSIS — I5033 Acute on chronic diastolic (congestive) heart failure: Secondary | ICD-10-CM

## 2019-10-21 DIAGNOSIS — I10 Essential (primary) hypertension: Secondary | ICD-10-CM

## 2019-10-21 DIAGNOSIS — E118 Type 2 diabetes mellitus with unspecified complications: Secondary | ICD-10-CM

## 2019-10-21 DIAGNOSIS — I2510 Atherosclerotic heart disease of native coronary artery without angina pectoris: Secondary | ICD-10-CM

## 2019-10-21 DIAGNOSIS — E785 Hyperlipidemia, unspecified: Secondary | ICD-10-CM

## 2019-10-21 DIAGNOSIS — K219 Gastro-esophageal reflux disease without esophagitis: Secondary | ICD-10-CM

## 2019-10-21 LAB — CBC
HCT: 37.8 % — ABNORMAL LOW (ref 39.0–52.0)
Hemoglobin: 13.3 g/dL (ref 13.0–17.0)
MCH: 33.1 pg (ref 26.0–34.0)
MCHC: 35.2 g/dL (ref 30.0–36.0)
MCV: 94 fL (ref 80.0–100.0)
Platelets: 147 10*3/uL — ABNORMAL LOW (ref 150–400)
RBC: 4.02 MIL/uL — ABNORMAL LOW (ref 4.22–5.81)
RDW: 14.9 % (ref 11.5–15.5)
WBC: 7.3 10*3/uL (ref 4.0–10.5)
nRBC: 0 % (ref 0.0–0.2)

## 2019-10-21 LAB — BASIC METABOLIC PANEL
Anion gap: 11 (ref 5–15)
BUN: 28 mg/dL — ABNORMAL HIGH (ref 8–23)
CO2: 28 mmol/L (ref 22–32)
Calcium: 9 mg/dL (ref 8.9–10.3)
Chloride: 99 mmol/L (ref 98–111)
Creatinine, Ser: 1.73 mg/dL — ABNORMAL HIGH (ref 0.61–1.24)
GFR calc Af Amer: 48 mL/min — ABNORMAL LOW (ref >60)
GFR calc non Af Amer: 42 mL/min — ABNORMAL LOW (ref >60)
Glucose, Bld: 180 mg/dL — ABNORMAL HIGH (ref 70–99)
Potassium: 4.1 mmol/L (ref 3.5–5.1)
Sodium: 138 mmol/L (ref 135–145)

## 2019-10-21 LAB — GLUCOSE, CAPILLARY
Glucose-Capillary: 175 mg/dL — ABNORMAL HIGH (ref 70–99)
Glucose-Capillary: 278 mg/dL — ABNORMAL HIGH (ref 70–99)
Glucose-Capillary: 218 mg/dL — ABNORMAL HIGH (ref 70–99)
Glucose-Capillary: 275 mg/dL — ABNORMAL HIGH (ref 70–99)

## 2019-10-21 LAB — HEPARIN LEVEL (UNFRACTIONATED): Heparin Unfractionated: 0.26 IU/mL — ABNORMAL LOW (ref 0.30–0.70)

## 2019-10-21 MED ORDER — CARVEDILOL 25 MG PO TABS
25.0000 mg | ORAL_TABLET | Freq: Two times a day (BID) | ORAL | Status: DC
  Administered 2019-10-21 – 2019-10-25 (×9): 25 mg via ORAL
  Filled 2019-10-21 (×9): qty 1

## 2019-10-21 NOTE — Consult Note (Signed)
ANTICOAGULATION CONSULT NOTE  Pharmacy Consult for Heparin Indication: chest pain/ACS  Patient Measurements: Height: 5' 7"  (170.2 cm) Weight: 91.4 kg (201 lb 8 oz) IBW/kg (Calculated) : 66.1 Heparin Dosing Weight: 84.6 kg  Vital Signs: Temp: 97.6 F (36.4 C) (04/10 0351) Temp Source: Oral (04/10 0351) BP: 164/74 (04/10 0351) Pulse Rate: 68 (04/10 0351)  Labs: Recent Labs    10/18/19 1526 10/18/19 1526 10/18/19 1832 10/19/19 0418 10/19/19 1740 10/20/19 0726 10/20/19 1326 10/21/19 0400  HGB 14.3   < >  --   --   --  13.3  --  13.3  HCT 39.4  --   --   --   --  37.0*  --  37.8*  PLT 172  --   --   --   --  137*  --  147*  APTT  --   --   --   --  36  --   --   --   LABPROT  --   --   --   --  13.3  --   --   --   INR  --   --   --   --  1.0  --   --   --   HEPARINUNFRC  --   --   --   --   --  0.32 0.38 0.26*  CREATININE 1.47*  --   --   --   --  1.36*  --  1.73*  TROPONINIHS 37*   < > 50* 33*  --  24*  --   --    < > = values in this interval not displayed.    Estimated Creatinine Clearance: 48.3 mL/min (A) (by C-G formula based on SCr of 1.73 mg/dL (H)).  Assessment: 59 yo M presenting with CP to [facility], started on heparin per pharmacy for ACS and transferred to [facility] for CABG evaluation after cath 4/8 showed severe 3-vessel CAD. Heparin was resumed post-cath 4/9 at midnight at [facility] and level was therapeutic on heparin at 1150 units/hr prior to transfer. Patient is not on anticoagulation PTA.   HL 0.26 subtherapeutic. Per RN, no issues with infusion, no bleeding.    Goal of Therapy:  Heparin level 0.3-0.7 units/ml Monitor platelets by anticoagulation protocol: Yes   Plan:  Increase heparin drip to 1250 units/hr Monitor daily heparin level and CBC, s/sx bleeding F/u CABG work-up  Benetta Spar, PharmD, BCPS, BCCP Clinical Pharmacist  Please check AMION for all [place] phone numbers After 10:00 PM, call [place]

## 2019-10-21 NOTE — Progress Notes (Signed)
     Contacted [facility] Surgery about consulting on this patient for 3-vessel CAD. Dr. Orvan Seen plans to see the patient later today.   Signed, Erma Heritage, PA-C 10/21/2019, 8:57 AM

## 2019-10-21 NOTE — Progress Notes (Signed)
Progress Note  Patient Name: Larry Terrell Date of Encounter: 10/21/2019  Primary Cardiologist: Mertie Moores, MD   Subjective   Feeling OK.  He had some shoulder pain overnight, which is his anginal equivalent.  Inpatient Medications    Scheduled Meds: . amLODipine  5 mg Oral Daily  . aspirin EC  81 mg Oral Daily  . atorvastatin  80 mg Oral q1800  . insulin aspart  0-15 Units Subcutaneous TID WC  . insulin aspart  0-5 Units Subcutaneous QHS  . insulin glargine  5 Units Subcutaneous Daily  . levETIRAcetam  500 mg Oral TID  . metoprolol succinate  100 mg Oral Daily  . pantoprazole  40 mg Oral Daily  . pregabalin  50 mg Oral TID  . venlafaxine  75 mg Oral TID WC   Continuous Infusions: . heparin 1,250 Units/hr (10/21/19 0822)   PRN Meds: acetaminophen, ALPRAZolam, nitroGLYCERIN, ondansetron (ZOFRAN) IV, oxyCODONE   Vital Signs    Vitals:   10/20/19 2353 10/21/19 0351 10/21/19 0817 10/21/19 0830  BP: (!) 158/73 (!) 164/74 (!) 166/84 (!) 155/78  Pulse: 71 68 72   Resp: 16 17    Temp: 97.7 F (36.5 C) 97.6 F (36.4 C) 97.8 F (36.6 C)   TempSrc: Oral Oral Oral   SpO2: 94% 97% 95%   Weight:  91.4 kg    Height:        Intake/Output Summary (Last 24 hours) at 10/21/2019 0913 Last data filed at 10/21/2019 0714 Gross per 24 hour  Intake 675.75 ml  Output 1050 ml  Net -374.25 ml   Last 3 Weights 10/21/2019 10/20/2019 10/20/2019  Weight (lbs) 201 lb 8 oz 200 lb 9.9 oz 206 lb 3.2 oz  Weight (kg) 91.4 kg 91 kg 93.532 kg      Telemetry    Sinus rhythm.  No events- Personally Reviewed  ECG    10/19/2019:  Sinus rhythm.  Rate 80 bpm.  Left axis deviation.  Prior inferior infarct.  Poor R wave progression.   - Personally Reviewed  Physical Exam   VS:  BP (!) 155/78   Pulse 72   Temp 97.8 F (36.6 C) (Oral)   Resp 17   Ht 5' 7"  (1.702 m)   Wt 91.4 kg   SpO2 95%   BMI 31.56 kg/m  , BMI Body mass index is 31.56 kg/m. GENERAL:  Well appearing HEENT: Pupils  equal round and reactive, fundi not visualized, oral mucosa unremarkable.  Poor dentition.  Missing several teeth. NECK:  No jugular venous distention, waveform within normal limits, carotid upstroke brisk and symmetric, no bruits LUNGS:  Crackles at R base HEART:  RRR.  PMI not displaced or sustained,S1 and S2 within normal limits, no S3, no S4, no clicks, no rubs, no murmurs ABD:  Flat, positive bowel sounds normal in frequency in pitch, no bruits, no rebound, no guarding, no midline pulsatile mass, no hepatomegaly, no splenomegaly EXT:  2 plus pulses throughout, no edema, no cyanosis no clubbing SKIN:  No rashes no nodules NEURO:  Cranial nerves II through XII grossly intact, motor grossly intact throughout PSYCH:  Cognitively intact, oriented to person place and time   Labs    High Sensitivity Troponin:   Recent Labs  Lab 10/18/19 1526 10/18/19 1832 10/19/19 0418 10/20/19 0726  TROPONINIHS 37* 50* 33* 24*      Chemistry Recent Labs  Lab 10/18/19 1526 10/20/19 0726 10/21/19 0400  NA 133* 133* 138  K 4.6 4.2  4.1  CL 101 100 99  CO2 24 24 28   GLUCOSE 366* 292* 180*  BUN 19 27* 28*  CREATININE 1.47* 1.36* 1.73*  CALCIUM 9.0 8.6* 9.0  PROT  --  6.2*  --   ALBUMIN  --  3.4*  --   AST  --  27  --   ALT  --  29  --   ALKPHOS  --  87  --   BILITOT  --  1.2  --   GFRNONAA 51* 56* 42*  GFRAA 59* >60 48*  ANIONGAP 8 9 11      Hematology Recent Labs  Lab 10/18/19 1526 10/20/19 0726 10/21/19 0400  WBC 8.9 8.8 7.3  RBC 4.30 4.00* 4.02*  HGB 14.3 13.3 13.3  HCT 39.4 37.0* 37.8*  MCV 91.6 92.5 94.0  MCH 33.3 33.3 33.1  MCHC 36.3* 35.9 35.2  RDW 14.9 15.2 14.9  PLT 172 137* 147*    BNP Recent Labs  Lab 10/18/19 2055  BNP 177.0*     DDimer No results for input(s): DDIMER in the last 168 hours.   Radiology    CARDIAC CATHETERIZATION  Result Date: 10/19/2019  Balloon angioplasty was performed.  Previously placed Mid Cx to Dist Cx drug eluting stent is  widely patent.  Balloon angioplasty was performed.  Mid RCA lesion is 80% stenosed.  Mid RCA to Dist RCA lesion is 10% stenosed.  Prox Cx lesion is 100% stenosed.  Ost Ramus lesion is 80% stenosed.  Prox LAD lesion is 95% stenosed.  Dist LAD lesion is 45% stenosed.  Mid LM lesion is 30% stenosed.  Ost RPDA lesion is 80% stenosed.  The left ventricular ejection fraction is 50-55% by visual estimate.  LV end diastolic pressure is mildly elevated.  There is mild left ventricular systolic dysfunction.    ECHOCARDIOGRAM COMPLETE  Result Date: 10/20/2019    ECHOCARDIOGRAM REPORT   Patient Name:   Larry Terrell Date of Exam: 10/20/2019 Medical Rec #:  [id number]       Height:       71.0 in Accession #:    [id number]      Weight:       206.2 lb Date of Birth:  1960-12-27        BSA:          2.136 m Patient Age:    59 years        BP:           145/73 mmHg Patient Gender: M               HR:           78 bpm. Exam Location:  [facility] Procedure: 2D Echo, Color Doppler, Cardiac Doppler and Intracardiac            Opacification Agent Indications:     I21.4 NSTEMI  History:         Patient has prior history of Echocardiogram examinations.                  Previous Myocardial Infarction, TIA and COPD; Risk                  Factors:Sleep Apnea and Diabetes.  Sonographer:     Charmayne Sheer RDCS (AE) Referring Phys:  [id number] [place] Diagnosing Phys: Ida Rogue MD  Sonographer Comments: Suboptimal apical window. Image acquisition challenging due to COPD and Image acquisition challenging due to patient body habitus. IMPRESSIONS  1.  Left ventricular ejection fraction, by estimation, is 55%. The left ventricle has low normal function. Grossly, no significant wall motion abnormality. There is mild left ventricular hypertrophy of the septal segment. Grade II diastolic dysfunction.  2. Right ventricular systolic function is normal. The right ventricular size is normal. Tricuspid regurgitation signal is inadequate for  assessing PA pressure.  3. Left atrial size was moderately dilated. FINDINGS  Left Ventricle: Left ventricular ejection fraction, by estimation, is 55 to 60%. The left ventricle has normal function. The left ventricle has no regional wall motion abnormalities. Definity contrast agent was given IV to delineate the left ventricular  endocardial borders. The left ventricular internal cavity size was normal in size. There is mild left ventricular hypertrophy of the septal segment. Left ventricular diastolic parameters are consistent with Grade II diastolic dysfunction (pseudonormalization). Right Ventricle: The right ventricular size is normal. No increase in right ventricular wall thickness. Right ventricular systolic function is normal. Tricuspid regurgitation signal is inadequate for assessing PA pressure. Left Atrium: Left atrial size was moderately dilated. Right Atrium: Right atrial size was normal in size. Pericardium: There is no evidence of pericardial effusion. Mitral Valve: The mitral valve is normal in structure. Normal mobility of the mitral valve leaflets. No evidence of mitral valve regurgitation. No evidence of mitral valve stenosis. MV peak gradient, 3.1 mmHg. The mean mitral valve gradient is 2.0 mmHg. Tricuspid Valve: The tricuspid valve is normal in structure. Tricuspid valve regurgitation is trivial. No evidence of tricuspid stenosis. Aortic Valve: The aortic valve is normal in structure. Aortic valve regurgitation is not visualized. Mild aortic valve sclerosis is present, with no evidence of aortic valve stenosis. Aortic valve mean gradient measures 5.0 mmHg. Aortic valve peak gradient measures 12.0 mmHg. Aortic valve area, by VTI measures 1.93 cm. Pulmonic Valve: The pulmonic valve was normal in structure. Pulmonic valve regurgitation is not visualized. No evidence of pulmonic stenosis. Aorta: The aortic root is normal in size and structure. Venous: The inferior vena cava is normal in size with  greater than 50% respiratory variability, suggesting right atrial pressure of 3 mmHg. IAS/Shunts: No atrial level shunt detected by color flow Doppler.  LEFT VENTRICLE PLAX 2D LVIDd:         4.51 cm  Diastology LVIDs:         2.86 cm  LV e' lateral:   10.00 cm/s LV PW:         1.02 cm  LV E/e' lateral: 8.2 LV IVS:        1.27 cm  LV e' medial:    5.11 cm/s LVOT diam:     1.80 cm  LV E/e' medial:  16.1 LV SV:         59 LV SV Index:   28 LVOT Area:     2.54 cm  LEFT ATRIUM             Index LA diam:        4.40 cm 2.06 cm/m LA Vol (A2C):   51.4 ml 24.06 ml/m LA Vol (A4C):   97.2 ml 45.50 ml/m LA Biplane Vol: 78.5 ml 36.75 ml/m  AORTIC VALVE                    PULMONIC VALVE AV Area (Vmax):    1.72 cm     PV Vmax:       0.83 m/s AV Area (Vmean):   1.96 cm     PV Vmean:  59.100 cm/s AV Area (VTI):     1.93 cm     PV VTI:        0.170 m AV Vmax:           173.00 cm/s  PV Peak grad:  2.8 mmHg AV Vmean:          103.000 cm/s PV Mean grad:  2.0 mmHg AV VTI:            0.306 m AV Peak Grad:      12.0 mmHg AV Mean Grad:      5.0 mmHg LVOT Vmax:         117.00 cm/s LVOT Vmean:        79.300 cm/s LVOT VTI:          0.232 m LVOT/AV VTI ratio: 0.76  AORTA Ao Root diam: 3.00 cm MITRAL VALVE MV Area (PHT): 3.72 cm    SHUNTS MV Peak grad:  3.1 mmHg    Systemic VTI:  0.23 m MV Mean grad:  2.0 mmHg    Systemic Diam: 1.80 cm MV Vmax:       0.88 m/s MV Vmean:      61.5 cm/s MV Decel Time: 204 msec MV E velocity: 82.50 cm/s MV A velocity: 70.30 cm/s MV E/A ratio:  1.17 Ida Rogue MD Electronically signed by Ida Rogue MD Signature Date/Time: 10/20/2019/4:56:43 PM    Final     Cardiac Studies   LHC 10/19/2019: Coronary dominance: Right  Left mainstem: Large vessel that bifurcates into the LAD and left circumflex, no significant disease noted, unable to exclude mild distal left main disease, appears hazy  Left anterior descending (LAD): Large vessel that extends to the apical region, diagonal branch 2 of  moderate size, severe proximal LAD disease at the takeoff of the diagonal estimated 95%, mild distal LAD disease  Left circumflex (LCx): Large vessel with OM branch 2, proximal to mid left circumflex is occluded with in-stent restenosis OM seen distally, fills via collaterals High ramus with severe ostial disease estimated 90%  Right coronary artery (RCA): Right dominant vessel with PL and PDA, severe in-stent restenosis of mid vessel stent, severe ostial PDA disease  Left ventriculography: Left ventricular systolic function is low normal, LVEF is estimated at 50%, there is no significant mitral regurgitation , no significant aortic valve stenosis Mild inferior wall hypokinesis  Final Conclusions:  Severe three-vessel coronary disease including Severe proximal LAD Severe mid RCA, in-stent restenosis , distal RCA Occluded proximal to mid left circumflex in-stent occlusion Severe ostial ramus disease Mild inferior wall hypokinesis  Recommendations:  Case discussed with interventional cardiology Given he has not been responding well to stenting as noted by occluded left circumflex stent, in-stent restenosis on the right in the setting of diabetes, would likely be a good candidate for CABG Given the severity of his symptoms and disease, will transfer to [facility] for further discussion and evaluation  Wall Motion       Resting               Left Heart  Left Ventricle The left ventricular size is normal. There is mild left ventricular systolic dysfunction. LV end diastolic pressure is mildly elevated. The left ventricular ejection fraction is 50-55% by visual estimate. There are LV function abnormalities.  Coronary Diagrams  Diagnostic Dominance: Right  _______________   Echo 10/20/19: IMPRESSIONS    1. Left ventricular ejection fraction, by estimation, is 55%. The left  ventricle has low normal function. Grossly,  no significant wall motion  abnormality. There  is mild left ventricular hypertrophy of the septal  segment. Grade II diastolic dysfunction.  2. Right ventricular systolic function is normal. The right ventricular  size is normal. Tricuspid regurgitation signal is inadequate for assessing  PA pressure.  3. Left atrial size was moderately dilated.   Patient Profile     59 y.o. male with CAD status post MI, chronic diastolic heart failure, TIA, PAD status post aortobifemoral bypass in 2003 with known stenosis of the bilateral SFAs, TIA, diabetes, diabetic neuropathy, hypertension, hyperlipidemia, seizure disorder, and prior tobacco abuse admitted with shoulder pain concerning for angina.  He underwent left heart catheterization and was found to have multivessel CAD.  Currently awaiting CABG.  Assessment & Plan    # Unstable Angina: # CAD: Patient presented with L shoulder pain as his anginal equivalent.  He was found to have severe three-vessel disease including severe proximal LAD, severe mid RCA with some in-stent restenosis and severe distal RCA.  His proximal to mid left circumflex had in-stent restenosis and was totally occluded.  Therefore coronary bypass grafting was recommended.  A consult has been placed.m he is amenable to having surgery.  Continue heparin, aspirin, and atorvastatin.  Blood pressure is poorly controlled.  We will switch metoprolol to carvedilol.  his last dose of clopidogrel was 4/7.   # PAD: # Claudication: He has known stenosis of bilateral SFAs.  He is status post aortobifemoral bypass in 2003.  He is followed at for [facility] for this and there are plans for surgical intervention soon.  Continue aspirin and heparin.  Clopidogrel on hold as above.  He is on a heparin drip.  # Essential hypertension: Blood pressure poorly controlled.  Continue amlodipine and switch metoprolol to carvedilol as above.  # Hyperlipidemia:  LDL was 72 on admission.  Atorvastatin increased to 80 this admission.  He will need repeat  lipids and a CMP in about 8 weeks.  # DM:   Hold Metformin on home.  Continue sliding scale insulin.  # Chronic diastolic heart failure: He has some crackles at the right base.  He reportedly received some Lasix.  We will put this on hold for now given that he has some acute renal failure.  Encouraged him to drink some today.  EF was normal on echo this admission.  # GERD: Continue PPI.  # Chronic back pain: Continue home oxycodone.  # AKI: Renal function up to 1.7 from 1.36.  # Seizures: On Keppra  # Depression: On Effexor.   For questions or updates, please contact [place] Please consult [email or web address] for contact info under        Signed, Skeet Latch, MD  10/21/2019, 9:13 AM

## 2019-10-22 ENCOUNTER — Inpatient Hospital Stay (HOSPITAL_COMMUNITY): Payer: Medicare HMO

## 2019-10-22 DIAGNOSIS — I25110 Atherosclerotic heart disease of native coronary artery with unstable angina pectoris: Secondary | ICD-10-CM

## 2019-10-22 DIAGNOSIS — Z01810 Encounter for preprocedural cardiovascular examination: Secondary | ICD-10-CM

## 2019-10-22 LAB — BASIC METABOLIC PANEL
Anion gap: 8 (ref 5–15)
BUN: 25 mg/dL — ABNORMAL HIGH (ref 8–23)
CO2: 29 mmol/L (ref 22–32)
Calcium: 8.9 mg/dL (ref 8.9–10.3)
Chloride: 100 mmol/L (ref 98–111)
Creatinine, Ser: 1.4 mg/dL — ABNORMAL HIGH (ref 0.61–1.24)
GFR calc Af Amer: >60 mL/min (ref >60)
GFR calc non Af Amer: 54 mL/min — ABNORMAL LOW (ref >60)
Glucose, Bld: 193 mg/dL — ABNORMAL HIGH (ref 70–99)
Potassium: 4.4 mmol/L (ref 3.5–5.1)
Sodium: 137 mmol/L (ref 135–145)

## 2019-10-22 LAB — CBC
HCT: 35.8 % — ABNORMAL LOW (ref 39.0–52.0)
Hemoglobin: 12.4 g/dL — ABNORMAL LOW (ref 13.0–17.0)
MCH: 32.7 pg (ref 26.0–34.0)
MCHC: 34.6 g/dL (ref 30.0–36.0)
MCV: 94.5 fL (ref 80.0–100.0)
Platelets: 137 10*3/uL — ABNORMAL LOW (ref 150–400)
RBC: 3.79 MIL/uL — ABNORMAL LOW (ref 4.22–5.81)
RDW: 15.2 % (ref 11.5–15.5)
WBC: 6.6 10*3/uL (ref 4.0–10.5)
nRBC: 0 % (ref 0.0–0.2)

## 2019-10-22 LAB — HEPARIN LEVEL (UNFRACTIONATED): Heparin Unfractionated: 0.37 IU/mL (ref 0.30–0.70)

## 2019-10-22 LAB — GLUCOSE, CAPILLARY
Glucose-Capillary: 187 mg/dL — ABNORMAL HIGH (ref 70–99)
Glucose-Capillary: 268 mg/dL — ABNORMAL HIGH (ref 70–99)
Glucose-Capillary: 241 mg/dL — ABNORMAL HIGH (ref 70–99)
Glucose-Capillary: 246 mg/dL — ABNORMAL HIGH (ref 70–99)

## 2019-10-22 MED ORDER — FUROSEMIDE 10 MG/ML IJ SOLN
40.0000 mg | Freq: Two times a day (BID) | INTRAMUSCULAR | Status: AC
  Administered 2019-10-22 (×2): 40 mg via INTRAVENOUS
  Filled 2019-10-22 (×2): qty 4

## 2019-10-22 MED ORDER — INSULIN GLARGINE 100 UNIT/ML ~~LOC~~ SOLN
5.0000 [IU] | Freq: Once | SUBCUTANEOUS | Status: AC
  Administered 2019-10-22: 5 [IU] via SUBCUTANEOUS
  Filled 2019-10-22: qty 0.05

## 2019-10-22 MED ORDER — INSULIN GLARGINE 100 UNIT/ML ~~LOC~~ SOLN
10.0000 [IU] | Freq: Every day | SUBCUTANEOUS | Status: DC
  Administered 2019-10-23 – 2019-10-25 (×3): 10 [IU] via SUBCUTANEOUS
  Filled 2019-10-22 (×4): qty 0.1

## 2019-10-22 MED ORDER — ISOSORBIDE MONONITRATE ER 30 MG PO TB24
30.0000 mg | ORAL_TABLET | Freq: Every day | ORAL | Status: DC
  Administered 2019-10-22 – 2019-10-25 (×4): 30 mg via ORAL
  Filled 2019-10-22 (×4): qty 1

## 2019-10-22 NOTE — Progress Notes (Signed)
Pre-CABG testing has been completed. Preliminary results can be found in CV Proc through chart review.   10/22/19 3:07 PM Larry Terrell RVT

## 2019-10-22 NOTE — Consult Note (Signed)
ANTICOAGULATION CONSULT NOTE  Pharmacy Consult for Heparin Indication: chest pain/ACS  Patient Measurements: Height: 5' 7"  (170.2 cm) Weight: 90.6 kg (199 lb 12.8 oz) IBW/kg (Calculated) : 66.1 Heparin Dosing Weight: 84.6 kg  Vital Signs: Temp: 97.7 F (36.5 C) (04/11 0735) Temp Source: Oral (04/11 0735) BP: 135/66 (04/11 0822) Pulse Rate: 67 (04/11 0735)  Labs: Recent Labs    10/19/19 1740 10/20/19 0726 10/20/19 0726 10/20/19 1326 10/21/19 0400 10/22/19 0252 10/22/19 0851  HGB  --  13.3   < >  --  13.3 12.4*  --   HCT  --  37.0*  --   --  37.8* 35.8*  --   PLT  --  137*  --   --  147* 137*  --   APTT 36  --   --   --   --   --   --   LABPROT 13.3  --   --   --   --   --   --   INR 1.0  --   --   --   --   --   --   HEPARINUNFRC  --  0.32   < > 0.38 0.26* 0.37  --   CREATININE  --  1.36*  --   --  1.73*  --  1.40*  TROPONINIHS  --  24*  --   --   --   --   --    < > = values in this interval not displayed.    Estimated Creatinine Clearance: 59.5 mL/min (A) (by C-G formula based on SCr of 1.4 mg/dL (H)).  Assessment: 59 yo M presenting with CP to [facility], started on heparin per pharmacy for ACS and transferred to [facility] for CABG evaluation after cath 4/8 showed severe 3-vessel CAD. Heparin was resumed post-cath 4/9 at midnight at [facility] and level was therapeutic on heparin at 1150 units/hr prior to transfer. Patient is not on anticoagulation PTA.   HL 0.37 is at low end of therapeutic. Continues to have some angina. Slight H/H drop, no reported bleeding. Increase rate slightly to keep in goal.   Goal of Therapy:  Heparin level 0.3-0.7 units/ml Monitor platelets by anticoagulation protocol: Yes   Plan:  Increase heparin drip to 1300 units/hr Monitor daily heparin level and CBC, s/sx bleeding Planned CABG 4/14  Benetta Spar, PharmD, BCPS, [facility] Clinical Pharmacist  Please check AMION for all [place] phone numbers After 10:00 PM, call [place] [phone]

## 2019-10-22 NOTE — Progress Notes (Addendum)
Progress Note  Patient Name: Larry Terrell Date of Encounter: 10/22/2019  Primary Cardiologist: Mertie Moores, MD   Subjective   Feeling OK.  He continues to have some left shoulder discomfort, which is his anginal equivalent.  Inpatient Medications    Scheduled Meds: . amLODipine  5 mg Oral Daily  . aspirin EC  81 mg Oral Daily  . atorvastatin  80 mg Oral q1800  . carvedilol  25 mg Oral BID WC  . insulin aspart  0-15 Units Subcutaneous TID WC  . insulin aspart  0-5 Units Subcutaneous QHS  . insulin glargine  5 Units Subcutaneous Daily  . levETIRAcetam  500 mg Oral TID  . pantoprazole  40 mg Oral Daily  . pregabalin  50 mg Oral TID  . venlafaxine  75 mg Oral TID WC   Continuous Infusions: . heparin 1,250 Units/hr (10/22/19 0300)   PRN Meds: acetaminophen, ALPRAZolam, nitroGLYCERIN, ondansetron (ZOFRAN) IV, oxyCODONE   Vital Signs    Vitals:   10/21/19 2054 10/22/19 0425 10/22/19 0735 10/22/19 0822  BP: 110/61 (!) 149/67 (!) 177/74 135/66  Pulse: 77 63 67   Resp: 20 15 13    Temp: 98.5 F (36.9 C) 97.6 F (36.4 C) 97.7 F (36.5 C)   TempSrc: Oral Oral Oral   SpO2: 95% 95% 97%   Weight:  90.6 kg    Height:        Intake/Output Summary (Last 24 hours) at 10/22/2019 0949 Last data filed at 10/22/2019 0919 Gross per 24 hour  Intake 1371.16 ml  Output 2235 ml  Net -863.84 ml   Last 3 Weights 10/22/2019 10/21/2019 10/20/2019  Weight (lbs) 199 lb 12.8 oz 201 lb 8 oz 200 lb 9.9 oz  Weight (kg) 90.629 kg 91.4 kg 91 kg      Telemetry    Sinus rhythm.  No events- Personally Reviewed  ECG    10/19/2019:  Sinus rhythm.  Rate 80 bpm.  Left axis deviation.  Prior inferior infarct.  Poor R wave progression.   - Personally Reviewed  Physical Exam   VS:  BP 135/66   Pulse 67   Temp 97.7 F (36.5 C) (Oral)   Resp 13   Ht 5' 7"  (1.702 m)   Wt 90.6 kg   SpO2 97%   BMI 31.29 kg/m  , BMI Body mass index is 31.29 kg/m. GENERAL:  Well appearing HEENT: Pupils equal  round and reactive, fundi not visualized, oral mucosa unremarkable.  Poor dentition.  Missing several teeth. NECK:  No jugular venous distention, waveform within normal limits, carotid upstroke brisk and symmetric, no bruits LUNGS: Diminished at right base HEART:  RRR.  PMI not displaced or sustained,S1 and S2 within normal limits, no S3, no S4, no clicks, no rubs, no murmurs ABD:  Flat, positive bowel sounds normal in frequency in pitch, no bruits, no rebound, no guarding, no midline pulsatile mass, no hepatomegaly, no splenomegaly EXT:  2 plus pulses throughout, no edema, no cyanosis no clubbing SKIN:  No rashes no nodules NEURO:  Cranial nerves II through XII grossly intact, motor grossly intact throughout PSYCH:  Cognitively intact, oriented to person place and time   Labs    High Sensitivity Troponin:   Recent Labs  Lab 10/18/19 1526 10/18/19 1832 10/19/19 0418 10/20/19 0726  TROPONINIHS 37* 50* 33* 24*      Chemistry Recent Labs  Lab 10/20/19 0726 10/21/19 0400 10/22/19 0851  NA 133* 138 137  K 4.2 4.1 4.4  CL 100 99 100  CO2 24 28 29   GLUCOSE 292* 180* 193*  BUN 27* 28* 25*  CREATININE 1.36* 1.73* 1.40*  CALCIUM 8.6* 9.0 8.9  PROT 6.2*  --   --   ALBUMIN 3.4*  --   --   AST 27  --   --   ALT 29  --   --   ALKPHOS 87  --   --   BILITOT 1.2  --   --   GFRNONAA 56* 42* 54*  GFRAA >60 48* >60  ANIONGAP 9 11 8      Hematology Recent Labs  Lab 10/20/19 0726 10/21/19 0400 10/22/19 0252  WBC 8.8 7.3 6.6  RBC 4.00* 4.02* 3.79*  HGB 13.3 13.3 12.4*  HCT 37.0* 37.8* 35.8*  MCV 92.5 94.0 94.5  MCH 33.3 33.1 32.7  MCHC 35.9 35.2 34.6  RDW 15.2 14.9 15.2  PLT 137* 147* 137*    BNP Recent Labs  Lab 10/18/19 2055  BNP 177.0*     DDimer No results for input(s): DDIMER in the last 168 hours.   Radiology    No results found.  Cardiac Studies   LHC 10/19/2019: Coronary dominance: Right  Left mainstem: Large vessel that bifurcates into the LAD and  left circumflex, no significant disease noted, unable to exclude mild distal left main disease, appears hazy  Left anterior descending (LAD): Large vessel that extends to the apical region, diagonal branch 2 of moderate size, severe proximal LAD disease at the takeoff of the diagonal estimated 95%, mild distal LAD disease  Left circumflex (LCx): Large vessel with OM branch 2, proximal to mid left circumflex is occluded with in-stent restenosis OM seen distally, fills via collaterals High ramus with severe ostial disease estimated 90%  Right coronary artery (RCA): Right dominant vessel with PL and PDA, severe in-stent restenosis of mid vessel stent, severe ostial PDA disease  Left ventriculography: Left ventricular systolic function is low normal, LVEF is estimated at 50%, there is no significant mitral regurgitation , no significant aortic valve stenosis Mild inferior wall hypokinesis  Final Conclusions:  Severe three-vessel coronary disease including Severe proximal LAD Severe mid RCA, in-stent restenosis , distal RCA Occluded proximal to mid left circumflex in-stent occlusion Severe ostial ramus disease Mild inferior wall hypokinesis  Recommendations:  Case discussed with interventional cardiology Given he has not been responding well to stenting as noted by occluded left circumflex stent, in-stent restenosis on the right in the setting of diabetes, would likely be a good candidate for CABG Given the severity of his symptoms and disease, will transfer to [facility] for further discussion and evaluation  Wall Motion       Resting               Left Heart  Left Ventricle The left ventricular size is normal. There is mild left ventricular systolic dysfunction. LV end diastolic pressure is mildly elevated. The left ventricular ejection fraction is 50-55% by visual estimate. There are LV function abnormalities.  Coronary Diagrams  Diagnostic Dominance:  Right  _______________   Echo 10/20/19: IMPRESSIONS    1. Left ventricular ejection fraction, by estimation, is 55%. The left  ventricle has low normal function. Grossly, no significant wall motion  abnormality. There is mild left ventricular hypertrophy of the septal  segment. Grade II diastolic dysfunction.  2. Right ventricular systolic function is normal. The right ventricular  size is normal. Tricuspid regurgitation signal is inadequate for assessing  PA pressure.  3. Left  atrial size was moderately dilated.   Patient Profile     59 y.o. male with CAD status post MI, chronic diastolic heart failure, TIA, PAD status post aortobifemoral bypass in 2003 with known stenosis of the bilateral SFAs, TIA, diabetes, diabetic neuropathy, hypertension, hyperlipidemia, seizure disorder, and prior tobacco abuse admitted with shoulder pain concerning for angina.  He underwent left heart catheterization and was found to have multivessel CAD.  Currently awaiting CABG.  Assessment & Plan    # Unstable Angina: # CAD: Patient presented with L shoulder pain as his anginal equivalent.  He was found to have severe three-vessel disease including severe proximal LAD, severe mid RCA with some in-stent restenosis and severe distal RCA.  His proximal to mid left circumflex had in-stent restenosis and was totally occluded.  Therefore coronary bypass grafting was recommended.  LVEF is preserved.  He continues to have some shoulder discomfort.  We will add Imdur 30 mg daily.  Continue heparin, aspirin, and atorvastatin.  Blood pressure is labile but seems to be improving metoprolol was switched to carvedilol.  His last dose of clopidogrel was 4/7.  CABG tentatively planned for 4/14.  Dental consult pending.  # PAD: # Claudication: He has known stenosis of bilateral SFAs.  He is status post aortobifemoral bypass in 2003.  He is followed at for [facility] for this and there are plans for surgical intervention soon.   Continue aspirin and heparin.  Clopidogrel on hold as above.  He is on a heparin drip.  # Acute diastolic heart failure:  Diminished breat sounds on exam and volume noted on CXR.  Will give a couple doses of lasix.   # Essential hypertension: Blood pressure is improving.  Continue amlodipine and metoprolol was switched to carvedilol as above.  Adding isosorbide dinitrate.  # Hyperlipidemia:  LDL was 72 on admission.  Atorvastatin increased to 80 this admission.  He will need repeat lipids and a CMP in about 8 weeks.  # DM:   Hold Metformin on home.  Continue sliding scale insulin.  Increase Lantus to 10 units.  # Chronic diastolic heart failure: He has some crackles at the right base.  He reportedly received some Lasix.  We will put this on hold for now given that he has some acute renal failure.  Encouraged him to drink some today.  EF was normal on echo this admission.  # GERD: Continue PPI.  # Chronic back pain: Continue home oxycodone.  # AKI: Renal function up to 1.7 from 1.36.  # Seizures: On Keppra  # Depression: On Effexor.   For questions or updates, please contact [place] Please consult [email or web address] for contact info under        Signed, Skeet Latch, MD  10/22/2019, 9:49 AM

## 2019-10-22 NOTE — Consult Note (Signed)
[address]       [place] [address]             [phone]        Larry Terrell Medical Record #[id number] Date of Birth: 1961-03-22  Referring: No ref. provider found Primary Care: Medicine, [employer] Cardiologist:Philip Nahser, MD  Chief Complaint:   No chief complaint on file.  Left-sided chest pain/pressure History of Present Illness:     Kindly asked to see 59 yo man with h/o CAD s/p PCI of RCA & LCX as well as long h/o PVD s/p ABF in 2003 and DM presented with left chest/shoulder pain in the past few weeks. Given his known extensive hx, he was taken to the [facility] cath lab which showed severe multivessel CAD. Referred for CABG. He has been taking clopidogrel and is now on heparin intravenously with Plavix washout. Denies chest pain/pressure. No current complaints   Current Activity/ Functional Status: Patient will be independent with mobility/ambulation, transfers, ADL's, IADL's.   Zubrod Score: At the time of surgery this patient's most appropriate activity status/level should be described as: []     0    Normal activity, no symptoms []     1    Restricted in physical strenuous activity but ambulatory, able to do out [place] work []     2    Ambulatory and capable of self care, unable to do work activities, up and about                 more than 50%  Of the time                            []     3    Only limited self care, in bed greater than 50% of waking hours []     4    Completely disabled, no self care, confined to bed or chair []     5    Moribund  Past Medical History:  Diagnosis Date  . CHF (congestive heart failure) (HCC)    diastolic (echo at [facility] 1914) EF 55-60%  . Cirrhosis ([place])    NASH per records  . COPD (chronic obstructive pulmonary disease) ([place])   . Depression   . Diabetes mellitus with neuropathy ([place])   . Heart attack ([place])   . Neuropathy   . OSA (obstructive sleep apnea)   . Peripheral vascular disease  due to secondary diabetes mellitus ([place])   . Splenomegaly   . 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 TIA (transient ischemic attack) April 2016    Past Surgical History:  Procedure Laterality Date  . AORTA - FEMORAL ARTERY BYPASS GRAFT Bilateral   . CARDIAC CATHETERIZATION    . cardiac stents  July 2015   . CORONARY STENT INTERVENTION N/A 08/09/2017   Procedure: CORONARY STENT INTERVENTION;  Surgeon: Wellington Hampshire, MD;  Location: [facility] CV LAB;  Service: Cardiovascular;  Laterality: N/A;  . LAPAROSCOPIC APPENDECTOMY N/A 07/10/2015   Procedure: APPENDECTOMY LAPAROSCOPIC;  Surgeon: Rolm Bookbinder, MD;  Location: [facility];  Service: General;  Laterality: N/A;  . LEFT HEART CATH AND CORONARY ANGIOGRAPHY N/A 08/09/2017   Procedure: LEFT HEART CATH AND CORONARY ANGIOGRAPHY;  Surgeon: Wellington Hampshire, MD;  Location: [facility] CV LAB;  Service: Cardiovascular;  Laterality: N/A;  . LEFT HEART CATH AND CORONARY ANGIOGRAPHY N/A 10/19/2019   Procedure: LEFT HEART  CATH AND CORONARY ANGIOGRAPHY;  Surgeon: Minna Merritts, MD;  Location: [facility] CV LAB;  Service: Cardiovascular;  Laterality: N/A;    Social History   Tobacco Use  Smoking Status Former Smoker  . Types: Cigarettes  . Quit date: 2009  . Years since quitting: 12.2  Smokeless Tobacco Never Used    Social History   Substance and Sexual Activity  Alcohol Use No  . Alcohol/week: 0.0 standard drinks     Allergies  Allergen Reactions  . Gabapentin   . Ace Inhibitors Cough    Other reaction(s): Other (See Comments) cough    Current Facility-Administered Medications  Medication Dose Route Frequency Provider Last Rate Last Admin  . acetaminophen (TYLENOL) tablet 650 mg  650 mg Oral Q4H PRN Barrett, Evelene Croon, PA-C      . ALPRAZolam (XANAX) tablet 0.25 mg  0.25 mg Oral BID PRN Barrett, Rhonda G, PA-C      . amLODipine (NORVASC) tablet 5 mg  5 mg Oral Daily Barrett, Rhonda G, PA-C   5 mg at 10/22/19 0823  .  aspirin EC tablet 81 mg  81 mg Oral Daily Barrett, Rhonda G, PA-C   81 mg at 10/22/19 0823  . atorvastatin (LIPITOR) tablet 80 mg  80 mg Oral q1800 Barrett, Rhonda G, PA-C   80 mg at 10/21/19 1732  . carvedilol (COREG) tablet 25 mg  25 mg Oral BID WC Skeet Latch, MD   25 mg at 10/22/19 1696  . heparin ADULT infusion 100 units/mL (25000 units/222m sodium chloride 0.45%)  1,250 Units/hr Intravenous Continuous CDonnamae Jude RPH 12.5 mL/hr at 10/22/19 0300 1,250 Units/hr at 10/22/19 0300  . insulin aspart (novoLOG) injection 0-15 Units  0-15 Units Subcutaneous TID WC AElouise Munroe MD   3 Units at 10/22/19 [phone] . insulin aspart (novoLOG) injection 0-5 Units  0-5 Units Subcutaneous QHS AElouise Munroe MD   3 Units at 10/21/19 2125  . insulin glargine (LANTUS) injection 5 Units  5 Units Subcutaneous Daily Barrett, REvelene Croon PA-C   5 Units at 10/22/19 08101 . levETIRAcetam (KEPPRA) tablet 500 mg  500 mg Oral TID Barrett, Rhonda G, PA-C   500 mg at 10/22/19 07510 . nitroGLYCERIN (NITROSTAT) SL tablet 0.4 mg  0.4 mg Sublingual Q5 Min x 3 PRN Barrett, Rhonda G, PA-C      . ondansetron (ZOFRAN) injection 4 mg  4 mg Intravenous Q6H PRN Barrett, Rhonda G, PA-C      . oxyCODONE (Oxy IR/ROXICODONE) immediate release tablet 5 mg  5 mg Oral Q4H PRN Barrett, Rhonda G, PA-C   5 mg at 10/22/19 0823  . pantoprazole (PROTONIX) EC tablet 40 mg  40 mg Oral Daily Barrett, Rhonda G, PA-C   40 mg at 10/22/19 02585 . pregabalin (LYRICA) capsule 50 mg  50 mg Oral TID Barrett, REvelene Croon PA-C      . venlafaxine ([facility] tablet 75 mg  75 mg Oral TID WC Barrett, Rhonda G, PA-C   75 mg at 10/22/19 02778   Medications Prior to Admission  Medication Sig Dispense Refill Last Dose  . acetaminophen (TYLENOL) 500 MG tablet Take 1,000 mg by mouth daily as needed.     .Marland KitchenamLODipine (NORVASC) 5 MG tablet Take 5 mg by mouth daily.     .Marland Kitchenaspirin 81 MG chewable tablet Chew 1 tablet (81 mg total) by mouth daily. 30 tablet 1    . atorvastatin (LIPITOR) 40 MG tablet Take 40  mg by mouth daily.      Marland Kitchen atorvastatin (LIPITOR) 80 MG tablet Take 1 tablet (80 mg total) by mouth daily. 90 tablet 3   . ezetimibe (ZETIA) 10 MG tablet Take 1 tablet (10 mg total) by mouth daily. 90 tablet 3   . famotidine (PEPCID) 20 MG tablet Take 1 tablet (20 mg total) by mouth 2 (two) times daily. (Patient taking differently: Take 20 mg by mouth daily as needed. ) 8 tablet 0   . furosemide (LASIX) 10 MG/ML injection Inject 4 mLs (40 mg total) into the vein every 12 (twelve) hours. 4 mL 0   . heparin 25000-0.45 UT/250ML-% infusion Inject 1,150 Units/hr into the vein continuous.     . insulin aspart (NOVOLOG) 100 UNIT/ML injection Inject 0-15 Units into the skin 4 (four) times daily - after meals and at bedtime. 10 mL 11   . insulin aspart (NOVOLOG) 100 UNIT/ML injection Inject 10 Units into the skin 3 (three) times daily with meals. 10 mL 11   . insulin glargine (LANTUS) 100 UNIT/ML injection Inject 0.05 mLs (5 Units total) into the skin daily. 10 mL 11   . levETIRAcetam (KEPPRA) 500 MG tablet Take 500 mg by mouth 3 (three) times daily.     Marland Kitchen losartan (COZAAR) 100 MG tablet Take 100 mg by mouth daily.     . metFORMIN (GLUCOPHAGE) 500 MG tablet Take 500 mg by mouth 2 (two) times daily with a meal.     . metoprolol succinate (TOPROL-XL) 100 MG 24 hr tablet Take 100 mg by mouth daily.     . nitroGLYCERIN (NITROSTAT) 0.4 MG SL tablet Place 0.4 mg under the tongue every 5 (five) minutes as needed for chest pain.     Marland Kitchen oxyCODONE (OXY IR/ROXICODONE) 5 MG immediate release tablet Take 5 mg by mouth every 8 (eight) hours as needed for pain.     . pantoprazole (PROTONIX) 40 MG tablet Take 1 tablet (40 mg total) by mouth daily. 30 tablet 0   . pregabalin (LYRICA) 50 MG capsule Take 1 capsule (50 mg total) by mouth 3 (three) times daily. 90 capsule 2   . venlafaxine (EFFEXOR) 75 MG tablet Take 75 mg by mouth 3 (three) times daily.       Family History    Problem Relation Age of Onset  . Heart attack Mother 34  . Stroke Mother   . Heart attack Father   . Alzheimer's disease Father      Review of Systems:   ROS A comprehensive review of systems was negative.     Cardiac Review of Systems: Y or  [    ]= no  Chest Pain [    ]  Resting SOB [   ] Exertional SOB  [  ]  Orthopnea [  ]   Pedal Edema [   ]    Palpitations [  ] Syncope  [  ]   Presyncope [   ]  General Review of Systems: [Y] = yes [  ]=no Constitional: recent weight change [  ]; anorexia [  ]; fatigue [  ]; nausea [  ]; night sweats [  ]; fever [  ]; or chills [  ]  Dental: Last Dentist visit:   Eye : blurred vision [  ]; diplopia [   ]; vision changes [  ];  Amaurosis fugax[  ]; Resp: cough [  ];  wheezing[  ];  hemoptysis[  ]; shortness of breath[  ]; paroxysmal nocturnal dyspnea[  ]; dyspnea on exertion[  ]; or orthopnea[  ];  GI:  gallstones[  ], vomiting[  ];  dysphagia[  ]; melena[  ];  hematochezia [  ]; heartburn[  ];   Hx of  Colonoscopy[  ]; GU: kidney stones [  ]; hematuria[  ];   dysuria [  ];  nocturia[  ];  history of     obstruction [  ]; urinary frequency [  ]             Skin: rash, swelling[  ];, hair loss[  ];  peripheral edema[  ];  or itching[  ]; Musculosketetal: myalgias[  ];  joint swelling[  ];  joint erythema[  ];  joint pain[  ];  back pain[  ];  Heme/Lymph: bruising[  ];  bleeding[  ];  anemia[  ];  Neuro: TIA[  ];  headaches[  ];  stroke[  ];  vertigo[  ];  seizures[  ];   paresthesias[  ];  difficulty walking[  ];  Psych:depression[  ]; anxiety[  ];  Endocrine: diabetes[  ];  thyroid dysfunction[  ];             Physical Exam: BP 135/66   Pulse 67   Temp 97.7 F (36.5 C) (Oral)   Resp 13   Ht 5' 7"  (1.702 m)   Wt 90.6 kg   SpO2 97%   BMI 31.29 kg/m    General appearance: alert and cooperative Head: Normocephalic, without obvious abnormality, atraumatic Neck: no  adenopathy, no carotid bruit, no JVD, supple, symmetrical, trachea midline and thyroid not enlarged, symmetric, no tenderness/mass/nodules Resp: clear to auscultation bilaterally Cardio: regular rate and rhythm, S1, S2 normal, no murmur, click, rub or gallop GI: soft, non-tender; bowel sounds normal; no masses,  no organomegaly Extremities: diminished pulses bilaterally in feet Neurologic: Alert and oriented X 3, normal strength and tone. Normal symmetric reflexes. Normal coordination and gait  Diagnostic Studies & Laboratory data:     Recent Radiology Findings:   No results found.   I have independently reviewed the above radiologic studies and discussed with the patient   Recent Lab Findings: Lab Results  Component Value Date   WBC 6.6 10/22/2019   HGB 12.4 (L) 10/22/2019   HCT 35.8 (L) 10/22/2019   PLT 137 (L) 10/22/2019   GLUCOSE 180 (H) 10/21/2019   CHOL 160 10/19/2019   TRIG 482 (H) 10/19/2019   HDL 23 (L) 10/19/2019   LDLDIRECT 72.0 10/19/2019   LDLCALC UNABLE TO CALCULATE IF TRIGLYCERIDE OVER 400 mg/dL 10/19/2019   ALT 29 10/20/2019   AST 27 10/20/2019   NA 138 10/21/2019   K 4.1 10/21/2019   CL 99 10/21/2019   CREATININE 1.73 (H) 10/21/2019   BUN 28 (H) 10/21/2019   CO2 28 10/21/2019   TSH 0.176 (L) 12/15/2013   INR 1.0 10/19/2019   HGBA1C 7.3 (H) 10/18/2019      Assessment / Plan:       59 yo man with angina and severe 3V CAD. Agree that CABG is best therapeutic option. Will tentatively plan CABG on 4/14. In meantime, complete work-up with vascular studies and dental consult.   I  spent  30 minutes counseling the patient face to face.   Freja Faro Z. Orvan Seen, MD [phone] 10/22/2019 9:38 AM

## 2019-10-23 ENCOUNTER — Other Ambulatory Visit: Payer: Self-pay

## 2019-10-23 ENCOUNTER — Other Ambulatory Visit: Payer: Self-pay | Admitting: *Deleted

## 2019-10-23 ENCOUNTER — Encounter (HOSPITAL_COMMUNITY): Payer: Self-pay | Admitting: Internal Medicine

## 2019-10-23 DIAGNOSIS — I2510 Atherosclerotic heart disease of native coronary artery without angina pectoris: Secondary | ICD-10-CM

## 2019-10-23 LAB — CBC
HCT: 33.2 % — ABNORMAL LOW (ref 39.0–52.0)
Hemoglobin: 11.7 g/dL — ABNORMAL LOW (ref 13.0–17.0)
MCH: 32.7 pg (ref 26.0–34.0)
MCHC: 35.2 g/dL (ref 30.0–36.0)
MCV: 92.7 fL (ref 80.0–100.0)
Platelets: 121 10*3/uL — ABNORMAL LOW (ref 150–400)
RBC: 3.58 MIL/uL — ABNORMAL LOW (ref 4.22–5.81)
RDW: 15.1 % (ref 11.5–15.5)
WBC: 6.4 10*3/uL (ref 4.0–10.5)
nRBC: 0 % (ref 0.0–0.2)

## 2019-10-23 LAB — BASIC METABOLIC PANEL
Anion gap: 10 (ref 5–15)
BUN: 30 mg/dL — ABNORMAL HIGH (ref 8–23)
CO2: 28 mmol/L (ref 22–32)
Calcium: 8.8 mg/dL — ABNORMAL LOW (ref 8.9–10.3)
Chloride: 98 mmol/L (ref 98–111)
Creatinine, Ser: 1.57 mg/dL — ABNORMAL HIGH (ref 0.61–1.24)
GFR calc Af Amer: 54 mL/min — ABNORMAL LOW (ref >60)
GFR calc non Af Amer: 47 mL/min — ABNORMAL LOW (ref >60)
Glucose, Bld: 244 mg/dL — ABNORMAL HIGH (ref 70–99)
Potassium: 4.4 mmol/L (ref 3.5–5.1)
Sodium: 136 mmol/L (ref 135–145)

## 2019-10-23 LAB — GLUCOSE, CAPILLARY
Glucose-Capillary: 193 mg/dL — ABNORMAL HIGH (ref 70–99)
Glucose-Capillary: 249 mg/dL — ABNORMAL HIGH (ref 70–99)
Glucose-Capillary: 290 mg/dL — ABNORMAL HIGH (ref 70–99)
Glucose-Capillary: 189 mg/dL — ABNORMAL HIGH (ref 70–99)

## 2019-10-23 LAB — HEPARIN LEVEL (UNFRACTIONATED): Heparin Unfractionated: 0.38 IU/mL (ref 0.30–0.70)

## 2019-10-23 MED ORDER — INSULIN ASPART 100 UNIT/ML ~~LOC~~ SOLN
5.0000 [IU] | Freq: Three times a day (TID) | SUBCUTANEOUS | Status: DC
  Administered 2019-10-23 – 2019-10-25 (×8): 5 [IU] via SUBCUTANEOUS

## 2019-10-23 MED ORDER — AMLODIPINE BESYLATE 10 MG PO TABS
10.0000 mg | ORAL_TABLET | Freq: Every day | ORAL | Status: DC
  Administered 2019-10-23 – 2019-10-25 (×3): 10 mg via ORAL
  Filled 2019-10-23 (×3): qty 1

## 2019-10-23 MED ORDER — INSULIN ASPART 100 UNIT/ML ~~LOC~~ SOLN: 0.0000 [IU] | Freq: Three times a day (TID) | SUBCUTANEOUS | Status: DC

## 2019-10-23 MED ORDER — OMEGA-3-ACID ETHYL ESTERS 1 G PO CAPS
1.0000 g | ORAL_CAPSULE | Freq: Two times a day (BID) | ORAL | Status: DC
  Administered 2019-10-23 – 2019-11-06 (×22): 1 g via ORAL
  Filled 2019-10-23 (×23): qty 1

## 2019-10-23 NOTE — Progress Notes (Signed)
CARDIAC REHAB PHASE I   PRE:  Rate/Rhythm: 72 SR    BP: sitting 133/76    SaO2: 92 1L  MODE:  Ambulation: to recliner   POST:  Rate/Rhythm: 80 SR    BP: sitting      SaO2: 96 1L  Pt sts he only walks short distances in house due to severe claudication sx and was to have surgery soon. He also sts he has balance issues.  Pt transferred out of bed to recliner with min assist. Seems to doubt himself at times. Discussed sternal precautions, mobility post op, IS (1100 mL), and d/c planning. Voiced understanding and I encouraged pt to read and watch educational materials. His brother and cousin live with him. He will need assistance with ambulation post op, probably PT/OT. Did not ambulate due to reported angina here in hospital. Encouraged room ambulation/sitting in recliner. [place], ACSM 10/23/2019 11:56 AM

## 2019-10-23 NOTE — Progress Notes (Addendum)
Progress Note  Patient Name: Larry Terrell Date of Encounter: 10/23/2019  Primary Cardiologist: Mertie Moores, MD   Subjective   No recurrent left shoulder pain for past 2 days which is anginal equivalent.  No breathing issue overnight.  Inpatient Medications    Scheduled Meds: . amLODipine  10 mg Oral Daily  . aspirin EC  81 mg Oral Daily  . atorvastatin  80 mg Oral q1800  . carvedilol  25 mg Oral BID WC  . insulin aspart  0-15 Units Subcutaneous TID WC  . insulin aspart  0-5 Units Subcutaneous QHS  . insulin glargine  10 Units Subcutaneous Daily  . isosorbide mononitrate  30 mg Oral Daily  . levETIRAcetam  500 mg Oral TID  . pantoprazole  40 mg Oral Daily  . pregabalin  50 mg Oral TID  . venlafaxine  75 mg Oral TID WC   Continuous Infusions: . heparin 1,300 Units/hr (10/22/19 1929)   PRN Meds: acetaminophen, ALPRAZolam, nitroGLYCERIN, ondansetron (ZOFRAN) IV, oxyCODONE   Vital Signs    Vitals:   10/22/19 1606 10/22/19 1624 10/22/19 2053 10/23/19 0330  BP: 122/61 (!) 127/58 (!) 128/55 (!) 159/64  Pulse: 65 67 70 75  Resp:  14 16 19   Temp:  97.7 F (36.5 C) 97.7 F (36.5 C) 97.7 F (36.5 C)  TempSrc:  Oral Oral Oral  SpO2:  97% 95% 92%  Weight:    89.7 kg  Height:        Intake/Output Summary (Last 24 hours) at 10/23/2019 0923 Last data filed at 10/23/2019 0710 Gross per 24 hour  Intake 1181.69 ml  Output 2925 ml  Net -1743.31 ml   Last 3 Weights 10/23/2019 10/22/2019 10/21/2019  Weight (lbs) 197 lb 12.8 oz 199 lb 12.8 oz 201 lb 8 oz  Weight (kg) 89.721 kg 90.629 kg 91.4 kg      Telemetry    Normal sinus rhythm at rate of 70s- Personally Reviewed  ECG    No new tracing  Physical Exam   GEN: No acute distress.   Neck: No JVD Cardiac: RRR, no murmurs, rubs, or gallops.  Respiratory: Diminished breath sound at bibasilar bases GI: Soft, nontender, non-distended  MS: No edema; No deformity. Neuro:  Nonfocal  Psych: Normal affect   Labs      High Sensitivity Troponin:   Recent Labs  Lab 10/18/19 1526 10/18/19 1832 10/19/19 0418 10/20/19 0726  TROPONINIHS 37* 50* 33* 24*      Chemistry Recent Labs  Lab 10/20/19 0726 10/20/19 0726 10/21/19 0400 10/22/19 0851 10/23/19 0319  NA 133*   < > 138 137 136  K 4.2   < > 4.1 4.4 4.4  CL 100   < > 99 100 98  CO2 24   < > 28 29 28   GLUCOSE 292*   < > 180* 193* 244*  BUN 27*   < > 28* 25* 30*  CREATININE 1.36*   < > 1.73* 1.40* 1.57*  CALCIUM 8.6*   < > 9.0 8.9 8.8*  PROT 6.2*  --   --   --   --   ALBUMIN 3.4*  --   --   --   --   AST 27  --   --   --   --   ALT 29  --   --   --   --   ALKPHOS 87  --   --   --   --   BILITOT 1.2  --   --   --   --  GFRNONAA 56*   < > 42* 54* 47*  GFRAA >60   < > 48* >60 54*  ANIONGAP 9   < > 11 8 10    < > = values in this interval not displayed.     Hematology Recent Labs  Lab 10/21/19 0400 10/22/19 0252 10/23/19 0319  WBC 7.3 6.6 6.4  RBC 4.02* 3.79* 3.58*  HGB 13.3 12.4* 11.7*  HCT 37.8* 35.8* 33.2*  MCV 94.0 94.5 92.7  MCH 33.1 32.7 32.7  MCHC 35.2 34.6 35.2  RDW 14.9 15.2 15.1  PLT 147* 137* 121*    BNP Recent Labs  Lab 10/18/19 2055  BNP 177.0*     DDimer No results for input(s): DDIMER in the last 168 hours.   Radiology    DG Orthopantogram  Result Date: 10/22/2019 CLINICAL DATA:  Preoperative exam. EXAM: ORTHOPANTOGRAM/PANORAMIC COMPARISON:  None FINDINGS: Patient is largely edentulous. There is a single right maxillary molar, 4 maxillary incisors and evidence of prior root canal now with dental caries of teeth twelve in 13 on the left. Lower mandibular incisors and canines on the left. No acute abnormality. Single left central incisor along the mandible. IMPRESSION: The patient is largely dental largely edentulous with dental caries at site of prior root as described. Electronically Signed   By: Zetta Bills M.D.   On: 10/22/2019 15:51   VAS US DOPPLER PRE CABG  Result Date: 10/22/2019 PREOPERATIVE  VASCULAR EVALUATION  Indications:      Pre-CABG. Risk Factors:     Hypertension, hyperlipidemia, Diabetes, PAD. Limitations:      Patient somnolence, patient movement Comparison Study: No prior studies. Performing Technologist: Carlos Levering Rvt  Examination Guidelines: A complete evaluation includes B-mode imaging, spectral Doppler, color Doppler, and power Doppler as needed of all accessible portions of each vessel. Bilateral testing is considered an integral part of a complete examination. Limited examinations for reoccurring indications may be performed as noted.  Right Carotid Findings: +----------+--------+--------+--------+-----------------------+--------+           PSV cm/sEDV cm/sStenosisDescribe               Comments +----------+--------+--------+--------+-----------------------+--------+ CCA Prox  111     17              smooth and heterogenous         +----------+--------+--------+--------+-----------------------+--------+ CCA Distal65      16              smooth and heterogenous         +----------+--------+--------+--------+-----------------------+--------+ ICA Prox  78      19              smooth and heterogenous         +----------+--------+--------+--------+-----------------------+--------+ ICA Distal39      16                                     tortuous +----------+--------+--------+--------+-----------------------+--------+ ECA       305     36                                              +----------+--------+--------+--------+-----------------------+--------+ Portions of this table do not appear on this page. +----------+--------+-------+--------+------------+           PSV cm/sEDV cmsDescribeArm Pressure +----------+--------+-------+--------+------------+ Subclavian88  133          +----------+--------+-------+--------+------------+ +---------+--------+--+--------+-+----------------------------+ VertebralPSV cm/s35EDV  cm/s4Antegrade and High resistant +---------+--------+--+--------+-+----------------------------+ Left Carotid Findings: +----------+--------+-------+--------+--------------------------------+--------+           PSV cm/sEDV    StenosisDescribe                        Comments                   cm/s                                                    +----------+--------+-------+--------+--------------------------------+--------+ CCA Prox  64      16             smooth and heterogenous                  +----------+--------+-------+--------+--------------------------------+--------+ CCA Distal61      22             smooth and heterogenous                  +----------+--------+-------+--------+--------------------------------+--------+ ICA Prox  58      14             smooth, heterogenous and                                                  calcific                                 +----------+--------+-------+--------+--------------------------------+--------+ ICA Distal37      13                                                      +----------+--------+-------+--------+--------------------------------+--------+ ECA       424     88                                                      +----------+--------+-------+--------+--------------------------------+--------+ +----------+--------+--------+--------+------------+ SubclavianPSV cm/sEDV cm/sDescribeArm Pressure +----------+--------+--------+--------+------------+           138                     152          +----------+--------+--------+--------+------------+ +---------+--------+--+--------+--+---------+ VertebralPSV cm/s71EDV cm/s27Antegrade +---------+--------+--+--------+--+---------+  ABI Findings: +--------+------------------+-----+----------+--------+ Right   Rt Pressure (mmHg)IndexWaveform  Comment  +--------+------------------+-----+----------+--------+ [id number]                     triphasic          +--------+------------------+-----+----------+--------+ PTA     52                0.34 monophasic         +--------+------------------+-----+----------+--------+ DP      75  0.49 monophasic         +--------+------------------+-----+----------+--------+ +--------+------------------+-----+----------+------------------+ Left    Lt Pressure (mmHg)IndexWaveform  Comment            +--------+------------------+-----+----------+------------------+ [id number]                    triphasic                    +--------+------------------+-----+----------+------------------+ PTA                                      Unable to insonate +--------+------------------+-----+----------+------------------+ DP      76                0.50 monophasic                   +--------+------------------+-----+----------+------------------+ +-------+---------------+----------------+ ABI/TBIToday's ABI/TBIPrevious ABI/TBI +-------+---------------+----------------+ Right  0.49                            +-------+---------------+----------------+ Left   0.5                             +-------+---------------+----------------+  Right Doppler Findings: +--------+--------+-----+---------+--------+ Site    PressureIndexDoppler  Comments +--------+--------+-----+---------+--------+ [id number]          triphasic         +--------+--------+-----+---------+--------+ Radial               triphasic         +--------+--------+-----+---------+--------+ Ulnar                triphasic         +--------+--------+-----+---------+--------+  Left Doppler Findings: +--------+--------+-----+---------+--------+ Site    PressureIndexDoppler  Comments +--------+--------+-----+---------+--------+ [id number]          triphasic         +--------+--------+-----+---------+--------+ Radial               triphasic          +--------+--------+-----+---------+--------+ Ulnar                triphasic         +--------+--------+-----+---------+--------+  Summary: Right Carotid: Velocities in the right ICA are consistent with a 1-39% stenosis. Left Carotid: Velocities in the left ICA are consistent with a 1-39% stenosis. Vertebrals: Left vertebral artery demonstrates antegrade flow. Right vertebral             artery demonstrates high resistant flow. Right ABI: Resting right ankle-brachial index indicates severe right lower extremity arterial disease. Left ABI: Resting left ankle-brachial index indicates moderate left lower extremity arterial disease. Right Upper Extremity: Doppler waveform obliterate with right radial compression. Doppler waveform obliterate with right ulnar compression. Left Upper Extremity: Doppler waveform obliterate with left radial compression. Doppler waveform obliterate with left ulnar compression.    Preliminary     Cardiac Studies   LEFT HEART CATH AND CORONARY ANGIOGRAPHY 10/19/19  Conclusion   Balloon angioplasty was performed.  Previously placed Mid Cx to Dist Cx drug eluting stent is widely patent.  Balloon angioplasty was performed.  Mid RCA lesion is 80% stenosed.  Mid RCA to Dist RCA lesion is 10% stenosed.  Prox Cx lesion is 100% stenosed.  Ost Ramus lesion is 80% stenosed.  Prox LAD lesion is 95% stenosed.  Dist LAD lesion is  45% stenosed.  Mid LM lesion is 30% stenosed.  Ost RPDA lesion is 80% stenosed.  The left ventricular ejection fraction is 50-55% by visual estimate.  LV end diastolic pressure is mildly elevated.  There is mild left ventricular systolic dysfunction.     Diagnostic Dominance: Right     Echo 10/20/19 1. Left ventricular ejection fraction, by estimation, is 55%. The left  ventricle has low normal function. Grossly, no significant wall motion  abnormality. There is mild left ventricular hypertrophy of the septal  segment. Grade II  diastolic dysfunction.  2. Right ventricular systolic function is normal. The right ventricular  size is normal. Tricuspid regurgitation signal is inadequate for assessing  PA pressure.  3. Left atrial size was moderately dilated.   Patient Profile     59 y.o. male with hx of CAD s/p RCA and LCx stenting, chronic diastolic heart failure, PAD s/p aortobifemoral bypass in 2003 with known stenosis of the bilateral SFAs, TIA, DM2 with diabetic polyneuropathy, HTN, HLD, seizure disorder, chronic back pain, and prior tobacco usewho was [facility] with left shoulder pain concerning for anginal equivalent and underwent diagnostic LHC which revealed severe multi-vessel CAD with recommendation for transfer to [facility] for evaluation of CABG.  Assessment & Plan    1. Unstable angina/CAD - Cath with severe three-vessel disease including severe proximal LAD, severe mid RCA with some in-stent restenosis and severe distal RCA.  His proximal to mid left circumflex had in-stent restenosis and was totally occluded.  LVEF normal. Last dose of Plavix 4/7. For CABG on 4/14. Pending dental eval.  - No recurrent L should discomfort (anginal equivalent) -Continue aspirin, Lipitor, Coreg, Imdur and heparin  2.  Chronic diastolic heart failure -Given IV Lasix yesterday -Stable this morning  3.  AKI -Creatinine was 1.47 on 4/7 upon arrival to [facility] which increased to 1.73 on 4/10 post cardiac catheterization>> and improve 1.4 yesterday however given IV Lasix 40 8 AM twice daily>> 1.57 this morning -Hold Lasix and reevaluate renal function tomorrow  4.  Hypertension -Blood pressure elevated -Continue Coreg 25 mg twice daily -Increase Norvasc to 10 mg daily -Home losartan on hold given AKI>> resume post surgery  5.  Hyperlipidemia - 10/19/2019: Cholesterol 160; HDL 23; LDL Cholesterol UNABLE TO CALCULATE IF TRIGLYCERIDE OVER 400 mg/dL; Triglycerides 482; VLDL UNABLE TO CALCULATE IF TRIGLYCERIDE  OVER 400 mg/dL  - LDL was 72 on 4/8 -Continue Lipitor 80 mg (increased this admission) - Check Vascepa benefits (start Lovaza)   6. DM - A1c 7.3 on 4/8 - SSI  For questions or updates, please contact [place] Please consult [email or web address] for contact info under        Signed, Leanor Kail, PA  10/23/2019, 9:23 AM    Agree with note by Robbie Lis PA-C  No further chest pain on IV heparin.  Cath showed three-vessel disease with prior stenting.  He was on Plavix which has been held for Plavix washout.  Plan CABG by Dr. Orvan Seen on Wednesday morning.  Serum creatinine is 1.57 this morning.  Lorretta Harp, M.D., [place], [facility], [place] [address] [place], [place]  [address]  [phone] 10/23/2019 10:31 AM

## 2019-10-23 NOTE — Consult Note (Signed)
ANTICOAGULATION CONSULT NOTE  Pharmacy Consult for Heparin Indication: chest pain/ACS  Patient Measurements: Height: 5' 7"  (170.2 cm) Weight: 89.7 kg (197 lb 12.8 oz) IBW/kg (Calculated) : 66.1 Heparin Dosing Weight: 84.6 kg  Vital Signs: Temp: 97.7 F (36.5 C) (04/12 0330) Temp Source: Oral (04/12 0330) BP: 159/64 (04/12 0330) Pulse Rate: 75 (04/12 0330)  Labs: Recent Labs    10/21/19 0400 10/21/19 0400 10/22/19 0252 10/22/19 0851 10/23/19 0319  HGB 13.3   < > 12.4*  --  11.7*  HCT 37.8*  --  35.8*  --  33.2*  PLT 147*  --  137*  --  121*  HEPARINUNFRC 0.26*  --  0.37  --  0.38  CREATININE 1.73*  --   --  1.40* 1.57*   < > = values in this interval not displayed.    Estimated Creatinine Clearance: 52.8 mL/min (A) (by C-G formula based on SCr of 1.57 mg/dL (H)).  Assessment: 59 yo M presenting with CP to [facility] on heparin per pharmacy for ACS and transferred to [facility] for CABG evaluation after cath 4/8 showed severe 3-vessel CAD. Plans for CABG on 4/14 -platelet count= 121 (baseline is 120s-160s)   Goal of Therapy:  Heparin level 0.3-0.7 units/ml Monitor platelets by anticoagulation protocol: Yes   Plan:  Continue heparin at 1300 units/hr Monitor daily heparin level and CBC Planned CABG 4/14  Hildred Laser, PharmD Clinical Pharmacist **Pharmacist phone directory can now be found on [email or web address] ([email or web address]).  Listed under [place].

## 2019-10-23 NOTE — Progress Notes (Signed)
Inpatient Diabetes Program Recommendations  AACE/ADA: New Consensus Statement on Inpatient Glycemic Control (2015)  Target Ranges:  Prepandial:   less than 140 mg/dL      Peak postprandial:   less than 180 mg/dL (1-2 hours)      Critically ill patients:  140 - 180 mg/dL   Results for RYLEE, NUZUM (MRN [id number]) as of 10/23/2019 09:17  Ref. Range 10/22/2019 07:33 10/22/2019 11:39 10/22/2019 16:58 10/22/2019 20:50  Glucose-Capillary Latest Ref Range: 70 - 99 mg/dL 187 (H)  3 units NOVOLOG +  5 units LANTUS  268 (H)  8 units NOVOLOG +  5 units LANTUS  241 (H)  5 units NOVOLOG  246 (H)  2 units NOVOLOG    Results for GLENNIS, MONTENEGRO (MRN [id number]) as of 10/23/2019 09:17  Ref. Range 10/23/2019 07:49  Glucose-Capillary Latest Ref Range: 70 - 99 mg/dL 193 (H)   Results for YAROSLAV, GOMBOS (MRN [id number]) as of 10/23/2019 09:17  Ref. Range 10/18/2019 22:56  Hemoglobin A1C Latest Ref Range: 4.8 - 5.6 % 7.3 (H)    Admit with: CAD involving the native coronary arteries with unstable angina  History: DM, CHF, Cirrhosis, COPD  Home DM Meds: Metformin 500 mg BID       Lantus 5 units Daily       Novolog 10 units TID       Novolog 0-15 units QID per SSI        Current Orders: Lantus 10 units Daily      Novolog Moderate Correction Scale/ SSI (0-15 units) TID AC + HS      Per MD notes, tentative CABG 04/14     MD- Please consider the following in-hospital insulin adjustments:  1. Increase slightly to 12 units Daily  2. Start Novolog Meal Coverage: Novolog 5 units TID with meals  (Please add the following Hold Parameters: Hold if pt eats <50% of meal, Hold if pt NPO)     --Will follow patient during hospitalization--  Wyn Quaker RN, MSN, CDE Diabetes Coordinator Inpatient Glycemic Control Team Team Pager: [phone] (8a-5p)

## 2019-10-24 ENCOUNTER — Inpatient Hospital Stay (HOSPITAL_COMMUNITY): Payer: Medicare HMO

## 2019-10-24 ENCOUNTER — Encounter (HOSPITAL_COMMUNITY): Payer: Self-pay | Admitting: Internal Medicine

## 2019-10-24 DIAGNOSIS — Z01818 Encounter for other preprocedural examination: Secondary | ICD-10-CM | POA: Diagnosis not present

## 2019-10-24 DIAGNOSIS — I2510 Atherosclerotic heart disease of native coronary artery without angina pectoris: Secondary | ICD-10-CM

## 2019-10-24 LAB — PULMONARY FUNCTION TEST
FEF 25-75 Pre: 0.89 L/sec
FEF2575-%Pred-Pre: 33 %
FEV1-%Pred-Pre: 61 %
FEV1-Pre: 1.97 L
FEV1FVC-%Pred-Pre: 94 %
FEV6-%Pred-Pre: 61 %
FEV6-Pre: 2.48 L
FEV6FVC-%Pred-Pre: 94 %
FVC-%Pred-Pre: 64 %
FVC-Pre: 2.75 L
Pre FEV1/FVC ratio: 72 %
Pre FEV6/FVC Ratio: 90 %

## 2019-10-24 LAB — CBC
HCT: 34.8 % — ABNORMAL LOW (ref 39.0–52.0)
Hemoglobin: 12.3 g/dL — ABNORMAL LOW (ref 13.0–17.0)
MCH: 32.8 pg (ref 26.0–34.0)
MCHC: 35.3 g/dL (ref 30.0–36.0)
MCV: 92.8 fL (ref 80.0–100.0)
Platelets: 132 10*3/uL — ABNORMAL LOW (ref 150–400)
RBC: 3.75 MIL/uL — ABNORMAL LOW (ref 4.22–5.81)
RDW: 15.3 % (ref 11.5–15.5)
WBC: 6.5 10*3/uL (ref 4.0–10.5)
nRBC: 0 % (ref 0.0–0.2)

## 2019-10-24 LAB — BASIC METABOLIC PANEL
Anion gap: 9 (ref 5–15)
BUN: 27 mg/dL — ABNORMAL HIGH (ref 8–23)
CO2: 27 mmol/L (ref 22–32)
Calcium: 9.1 mg/dL (ref 8.9–10.3)
Chloride: 101 mmol/L (ref 98–111)
Creatinine, Ser: 1.54 mg/dL — ABNORMAL HIGH (ref 0.61–1.24)
GFR calc Af Amer: 56 mL/min — ABNORMAL LOW (ref >60)
GFR calc non Af Amer: 48 mL/min — ABNORMAL LOW (ref >60)
Glucose, Bld: 178 mg/dL — ABNORMAL HIGH (ref 70–99)
Potassium: 4.4 mmol/L (ref 3.5–5.1)
Sodium: 137 mmol/L (ref 135–145)

## 2019-10-24 LAB — GLUCOSE, CAPILLARY
Glucose-Capillary: 140 mg/dL — ABNORMAL HIGH (ref 70–99)
Glucose-Capillary: 207 mg/dL — ABNORMAL HIGH (ref 70–99)
Glucose-Capillary: 297 mg/dL — ABNORMAL HIGH (ref 70–99)
Glucose-Capillary: 160 mg/dL — ABNORMAL HIGH (ref 70–99)

## 2019-10-24 LAB — HEPARIN LEVEL (UNFRACTIONATED): Heparin Unfractionated: 0.31 IU/mL (ref 0.30–0.70)

## 2019-10-24 NOTE — Care Management (Signed)
Per [facility] [facility]:  Co-pay amount for Vascepa 2gm  po bid $9.20 for 30 day supply. This medication comes in .31m and 1 gm.  Generic brand not covered.  No PA required No deductible Tier 4 Retail pharmacy : [facility]

## 2019-10-24 NOTE — Progress Notes (Signed)
CARDIAC REHAB PHASE I   PRE:  Rate/Rhythm: 73 SR    BP: sitting 129/61    SaO2: 94 1L  MODE:  Ambulation: 30 ft   POST:  Rate/Rhythm: 85 SR    BP: sitting 140/65     SaO2: 92 2L  Needed assist to move to EOB. Verbal cues to move hips to EOB. Able to stand with verbal cues and rocking. Pt used RW. Takes very small steps, stiff legs. Sts he does this so his claudication sx does not start. Short distance in hall, fatigued. To recliner. Began having slight chest tightness after walk "its not bad". Reviewed ed. Pt is very debilitated and will need PT/OT post op. [place], ACSM 10/24/2019 3:31 PM

## 2019-10-24 NOTE — Care Management Important Message (Signed)
Important Message  Patient Details  Name: Larry Terrell MRN: [id number] Date of Birth: Sep 02, 1960   Medicare Important Message Given:  Yes     Shelda Altes 10/24/2019, 9:24 AM

## 2019-10-24 NOTE — Care Management (Signed)
Benefit check sent for:    vascepa 2gm po bid

## 2019-10-24 NOTE — Consult Note (Signed)
ANTICOAGULATION CONSULT NOTE  Pharmacy Consult for Heparin Indication: chest pain/ACS  Patient Measurements: Height: 5' 7"  (170.2 cm) Weight: 91.3 kg (201 lb 4.5 oz) IBW/kg (Calculated) : 66.1 Heparin Dosing Weight: 84.6 kg  Vital Signs: Temp: 97.6 F (36.4 C) (04/13 0600) Temp Source: Axillary (04/13 0600) BP: 145/58 (04/13 0847) Pulse Rate: 69 (04/13 0847)  Labs: Recent Labs    10/22/19 0252 10/22/19 0252 10/22/19 0851 10/23/19 0319 10/24/19 0320  HGB 12.4*   < >  --  11.7* 12.3*  HCT 35.8*  --   --  33.2* 34.8*  PLT 137*  --   --  121* 132*  HEPARINUNFRC 0.37  --   --  0.38 0.31  CREATININE  --   --  1.40* 1.57* 1.54*   < > = values in this interval not displayed.    Estimated Creatinine Clearance: 54.3 mL/min (A) (by C-G formula based on SCr of 1.54 mg/dL (H)).  Assessment: 59 yo M presenting with CP to [facility] on heparin per pharmacy for ACS and transferred to [facility] for CABG evaluation after cath 4/8 showed severe 3-vessel CAD. Plans for CABG on 4/14 -platelet count= 132 (baseline is 120s-160s) -heparin level at goal   Goal of Therapy:  Heparin level 0.3-0.7 units/ml Monitor platelets by anticoagulation protocol: Yes   Plan:  Continue heparin at 1300 units/hr Monitor daily heparin level and CBC Planned CABG 4/14  Hildred Laser, PharmD Clinical Pharmacist **Pharmacist phone directory can now be found on [email or web address] ([email or web address]).  Listed under [place].

## 2019-10-24 NOTE — Progress Notes (Addendum)
Progress Note  Patient Name: Larry Terrell Date of Encounter: 10/24/2019  Primary Cardiologist: Mertie Moores, MD   Subjective   Feeling well this morning.   Inpatient Medications    Scheduled Meds: . amLODipine  10 mg Oral Daily  . aspirin EC  81 mg Oral Daily  . atorvastatin  80 mg Oral q1800  . carvedilol  25 mg Oral BID WC  . insulin aspart  0-15 Units Subcutaneous TID WC  . insulin aspart  0-5 Units Subcutaneous QHS  . insulin aspart  5 Units Subcutaneous TID WC  . insulin glargine  10 Units Subcutaneous Daily  . isosorbide mononitrate  30 mg Oral Daily  . levETIRAcetam  500 mg Oral TID  . omega-3 acid ethyl esters  1 g Oral BID  . pantoprazole  40 mg Oral Daily  . pregabalin  50 mg Oral TID  . venlafaxine  75 mg Oral TID WC   Continuous Infusions: . heparin 1,300 Units/hr (10/23/19 1707)   PRN Meds: acetaminophen, ALPRAZolam, nitroGLYCERIN, ondansetron (ZOFRAN) IV, oxyCODONE   Vital Signs    Vitals:   10/23/19 1017 10/23/19 1500 10/23/19 2100 10/24/19 0600  BP: 136/73 126/66 (!) 149/79 (!) 132/55  Pulse: 72  66 70  Resp:   18 12  Temp:  97.7 F (36.5 C) 97.8 F (36.6 C) 97.6 F (36.4 C)  TempSrc:  Axillary Axillary Axillary  SpO2: 93% 96% 96% 95%  Weight:    91.3 kg  Height:        Intake/Output Summary (Last 24 hours) at 10/24/2019 0812 Last data filed at 10/24/2019 0622 Gross per 24 hour  Intake 1207.98 ml  Output 2500 ml  Net -1292.02 ml   Last 3 Weights 10/24/2019 10/23/2019 10/22/2019  Weight (lbs) 201 lb 4.5 oz 197 lb 12.8 oz 199 lb 12.8 oz  Weight (kg) 91.3 kg 89.721 kg 90.629 kg      Telemetry    SR - Personally Reviewed  ECG    No new tracing this morning.   Physical Exam  Pleasant WM, sitting up in bed.  GEN: No acute distress.   Neck: No JVD Cardiac: RRR, no murmurs, rubs, or gallops.  Respiratory: Rhonchi at the bases GI: Soft, nontender, non-distended  MS: No edema; No deformity. Neuro:  Nonfocal  Psych: Normal affect    Labs    High Sensitivity Troponin:   Recent Labs  Lab 10/18/19 1526 10/18/19 1832 10/19/19 0418 10/20/19 0726  TROPONINIHS 37* 50* 33* 24*      Chemistry Recent Labs  Lab 10/20/19 0726 10/21/19 0400 10/22/19 0851 10/23/19 0319 10/24/19 0320  NA 133*   < > 137 136 137  K 4.2   < > 4.4 4.4 4.4  CL 100   < > 100 98 101  CO2 24   < > 29 28 27   GLUCOSE 292*   < > 193* 244* 178*  BUN 27*   < > 25* 30* 27*  CREATININE 1.36*   < > 1.40* 1.57* 1.54*  CALCIUM 8.6*   < > 8.9 8.8* 9.1  PROT 6.2*  --   --   --   --   ALBUMIN 3.4*  --   --   --   --   AST 27  --   --   --   --   ALT 29  --   --   --   --   ALKPHOS 87  --   --   --   --  BILITOT 1.2  --   --   --   --   GFRNONAA 56*   < > 54* 47* 48*  GFRAA >60   < > >60 54* 56*  ANIONGAP 9   < > 8 10 9    < > = values in this interval not displayed.     Hematology Recent Labs  Lab 10/22/19 0252 10/23/19 0319 10/24/19 0320  WBC 6.6 6.4 6.5  RBC 3.79* 3.58* 3.75*  HGB 12.4* 11.7* 12.3*  HCT 35.8* 33.2* 34.8*  MCV 94.5 92.7 92.8  MCH 32.7 32.7 32.8  MCHC 34.6 35.2 35.3  RDW 15.2 15.1 15.3  PLT 137* 121* 132*    BNP Recent Labs  Lab 10/18/19 2055  BNP 177.0*     DDimer No results for input(s): DDIMER in the last 168 hours.   Radiology    DG Orthopantogram  Result Date: 10/22/2019 CLINICAL DATA:  Preoperative exam. EXAM: ORTHOPANTOGRAM/PANORAMIC COMPARISON:  None FINDINGS: Patient is largely edentulous. There is a single right maxillary molar, 4 maxillary incisors and evidence of prior root canal now with dental caries of teeth twelve in 13 on the left. Lower mandibular incisors and canines on the left. No acute abnormality. Single left central incisor along the mandible. IMPRESSION: The patient is largely dental largely edentulous with dental caries at site of prior root as described. Electronically Signed   By: Zetta Bills M.D.   On: 10/22/2019 15:51   VAS US DOPPLER PRE CABG  Result Date:  10/22/2019 PREOPERATIVE VASCULAR EVALUATION  Indications:      Pre-CABG. Risk Factors:     Hypertension, hyperlipidemia, Diabetes, PAD. Limitations:      Patient somnolence, patient movement Comparison Study: No prior studies. Performing Technologist: Carlos Levering Rvt  Examination Guidelines: A complete evaluation includes B-mode imaging, spectral Doppler, color Doppler, and power Doppler as needed of all accessible portions of each vessel. Bilateral testing is considered an integral part of a complete examination. Limited examinations for reoccurring indications may be performed as noted.  Right Carotid Findings: +----------+--------+--------+--------+-----------------------+--------+           PSV cm/sEDV cm/sStenosisDescribe               Comments +----------+--------+--------+--------+-----------------------+--------+ CCA Prox  111     17              smooth and heterogenous         +----------+--------+--------+--------+-----------------------+--------+ CCA Distal65      16              smooth and heterogenous         +----------+--------+--------+--------+-----------------------+--------+ ICA Prox  78      19              smooth and heterogenous         +----------+--------+--------+--------+-----------------------+--------+ ICA Distal39      16                                     tortuous +----------+--------+--------+--------+-----------------------+--------+ ECA       305     36                                              +----------+--------+--------+--------+-----------------------+--------+ Portions of this table do not appear on this page. +----------+--------+-------+--------+------------+  PSV cm/sEDV cmsDescribeArm Pressure +----------+--------+-------+--------+------------+ Subclavian88                     133          +----------+--------+-------+--------+------------+ +---------+--------+--+--------+-+----------------------------+  VertebralPSV cm/s35EDV cm/s4Antegrade and High resistant +---------+--------+--+--------+-+----------------------------+ Left Carotid Findings: +----------+--------+-------+--------+--------------------------------+--------+           PSV cm/sEDV    StenosisDescribe                        Comments                   cm/s                                                    +----------+--------+-------+--------+--------------------------------+--------+ CCA Prox  64      16             smooth and heterogenous                  +----------+--------+-------+--------+--------------------------------+--------+ CCA Distal61      22             smooth and heterogenous                  +----------+--------+-------+--------+--------------------------------+--------+ ICA Prox  58      14             smooth, heterogenous and                                                  calcific                                 +----------+--------+-------+--------+--------------------------------+--------+ ICA Distal37      13                                                      +----------+--------+-------+--------+--------------------------------+--------+ ECA       424     88                                                      +----------+--------+-------+--------+--------------------------------+--------+ +----------+--------+--------+--------+------------+ SubclavianPSV cm/sEDV cm/sDescribeArm Pressure +----------+--------+--------+--------+------------+           138                     152          +----------+--------+--------+--------+------------+ +---------+--------+--+--------+--+---------+ VertebralPSV cm/s71EDV cm/s27Antegrade +---------+--------+--+--------+--+---------+  ABI Findings: +--------+------------------+-----+----------+--------+ Right   Rt Pressure (mmHg)IndexWaveform  Comment   +--------+------------------+-----+----------+--------+ [id number]                    triphasic          +--------+------------------+-----+----------+--------+ PTA     52  0.34 monophasic         +--------+------------------+-----+----------+--------+ DP      75                0.49 monophasic         +--------+------------------+-----+----------+--------+ +--------+------------------+-----+----------+------------------+ Left    Lt Pressure (mmHg)IndexWaveform  Comment            +--------+------------------+-----+----------+------------------+ [id number]                    triphasic                    +--------+------------------+-----+----------+------------------+ PTA                                      Unable to insonate +--------+------------------+-----+----------+------------------+ DP      76                0.50 monophasic                   +--------+------------------+-----+----------+------------------+ +-------+---------------+----------------+ ABI/TBIToday's ABI/TBIPrevious ABI/TBI +-------+---------------+----------------+ Right  0.49                            +-------+---------------+----------------+ Left   0.5                             +-------+---------------+----------------+  Right Doppler Findings: +--------+--------+-----+---------+--------+ Site    PressureIndexDoppler  Comments +--------+--------+-----+---------+--------+ [id number]          triphasic         +--------+--------+-----+---------+--------+ Radial               triphasic         +--------+--------+-----+---------+--------+ Ulnar                triphasic         +--------+--------+-----+---------+--------+  Left Doppler Findings: +--------+--------+-----+---------+--------+ Site    PressureIndexDoppler  Comments +--------+--------+-----+---------+--------+ [id number]          triphasic          +--------+--------+-----+---------+--------+ Radial               triphasic         +--------+--------+-----+---------+--------+ Ulnar                triphasic         +--------+--------+-----+---------+--------+  Summary: Right Carotid: Velocities in the right ICA are consistent with a 1-39% stenosis. Left Carotid: Velocities in the left ICA are consistent with a 1-39% stenosis. Vertebrals: Left vertebral artery demonstrates antegrade flow. Right vertebral             artery demonstrates high resistant flow. Right ABI: Resting right ankle-brachial index indicates severe right lower extremity arterial disease. Left ABI: Resting left ankle-brachial index indicates moderate left lower extremity arterial disease. Right Upper Extremity: Doppler waveform obliterate with right radial compression. Doppler waveform obliterate with right ulnar compression. Left Upper Extremity: Doppler waveform obliterate with left radial compression. Doppler waveform obliterate with left ulnar compression.    Preliminary     Cardiac Studies   LEFT HEART CATH AND CORONARY ANGIOGRAPHY 10/19/19  Conclusion   Balloon angioplasty was performed.  Previously placed Mid Cx to Dist Cx drug eluting stent is widely patent.  Balloon angioplasty was performed.  Mid RCA lesion is 80% stenosed.  Mid RCA  to Dist RCA lesion is 10% stenosed.  Prox Cx lesion is 100% stenosed.  Ost Ramus lesion is 80% stenosed.  Prox LAD lesion is 95% stenosed.  Dist LAD lesion is 45% stenosed.  Mid LM lesion is 30% stenosed.  Ost RPDA lesion is 80% stenosed.  The left ventricular ejection fraction is 50-55% by visual estimate.  LV end diastolic pressure is mildly elevated.  There is mild left ventricular systolic dysfunction.    Diagnostic Dominance: Right     Echo 10/20/19 1. Left ventricular ejection fraction, by estimation, is 55%. The left  ventricle has low normal function. Grossly, no significant wall motion    abnormality. There is mild left ventricular hypertrophy of the septal  segment. Grade II diastolic dysfunction.  2. Right ventricular systolic function is normal. The right ventricular  size is normal. Tricuspid regurgitation signal is inadequate for assessing  PA pressure.  3. Left atrial size was moderately dilated.    Patient Profile     59 y.o. male with hx of CAD s/p RCA and LCx stenting, chronic diastolic heart failure, PAD s/p aortobifemoral bypass in 2003 with known stenosis of the bilateral SFAs,TIA,DM2 with diabetic polyneuropathy, HTN, HLD,seizure disorder, chronic back pain,and prior tobacco usewhowas evaluated [facility] left shoulder pain concerning for anginal equivalent and underwent diagnostic LHC which revealed severe multi-vessel CAD with recommendation for transfer to [facility] for evaluation of CABG.  Assessment & Plan    1. Unstable angina/CAD: Cath with severe three-vessel disease including severe proximal LAD, severe mid RCA with some in-stent restenosis and severe distal RCA. His proximal to mid left circumflex had in-stent restenosis and was totally occluded. LVEF normal. Last dose of Plavix 4/7. For CABG on 4/14.  - No recurrent L should discomfort (anginal equivalent) -Continue aspirin, Lipitor, Coreg, Imdur and heparin  2.  Chronic diastolic heart failure: Net - 3.5L, weight is slightly up this morning. Does not appear volume overloaded on exam. Will hold on additional IV lasix today.   3.  AKI: Creatinine was 1.47 on 4/7 upon arrival to [facility] which increased to 1.73 on 4/10 post cardiac catheterization>> and improve 1.4. Given IV lasix with mild increase to 1.57>>1.54  4.  Hypertension: stable with current therapy. -Continue Coreg 25 mg twice daily, Norvasc to 10 mg daily -Home losartan on hold given AKI>> resume post surgery  5.  Hyperlipidemia: - 10/19/2019: Cholesterol 160; HDL 23; LDL Cholesterol UNABLE TO CALCULATE IF TRIGLYCERIDE  OVER 400 mg/dL; Triglycerides 482; VLDL UNABLE TO CALCULATE IF TRIGLYCERIDE OVER 400 mg/dL  - LDL was 72 on 4/8 -Continue Lipitor 80 mg (increased this admission) -Check [employer] benefits (started on Lovaza)   6. DM: - A1c 7.3 on 4/8 - SSI  For questions or updates, please contact [place] Please consult [email or web address] for contact info under    Signed, Reino Bellis, NP  10/24/2019, 8:12 AM    Agree with note by Reino Bellis NP-C  Three-vessel CAD with other problems as outlined which are stable.  Plan CABG tomorrow morning with Dr. Orvan Seen.  Lorretta Harp, M.D., [place], [facility], [place] [address] [place], [place]  [id number]  [phone] 10/24/2019 10:10 AM

## 2019-10-24 NOTE — Consult Note (Signed)
DENTAL CONSULTATION  Date of Consultation:  10/24/2019 Patient Name:   Larry Terrell Date of Birth:   08/31/1960 Medical Record Number: [id number]  COVID 19 SCREENING: The patient does not symptoms concerning for COVID-19 infection (Including fever, chills, cough, or new SHORTNESS OF BREATH).    VITALS: BP (!) 132/55 (BP Location: Left Arm)   Pulse 70   Temp 97.6 F (36.4 C) (Axillary)   Resp 12   Ht 5' 7"  (1.702 m)   Wt 91.3 kg   SpO2 95%   BMI 31.52 kg/m   CHIEF COMPLAINT: Patient referred by Dr. Orvan Seen for a dental consultation.  HPI: Larry Terrell is a 59 year old male recently diagnosed with unstable angina and significant coronary artery disease.  Patient with anticipated three-vessel coronary bypass graft procedure with Dr. Orvan Seen.  Dental consultation requested to evaluate poor dentition.  The patient currently denies having any acute toothaches, swellings, or abscesses.  Patient was last seen by dentist " a long time ago".  Patient indicates that this was at least 5 years ago.  Patient had a root canal therapy performed at that time by a dentist in [facility].  Patient cannot remember the name of the dentist.  Patient denies having partial dentures.  Patient denies having dental phobia.   PROBLEM LIST: Patient Active Problem List   Diagnosis Date Noted   CAD in native artery 10/20/2019    Priority: High   Unstable angina ([place]) 10/20/2019   (HFpEF) heart failure with preserved ejection fraction ([place]) 10/20/2019   Essential hypertension 10/20/2019   Hyperlipidemia LDL goal <70 10/20/2019   PAD (peripheral artery disease) ([place]) 10/20/2019   Chest pain 10/18/2019   Altered mental status 06/04/2019   Sepsis ([place]) 06/04/2019   AKI (acute kidney injury) ([place]) 06/04/2019   GERD (gastroesophageal reflux disease) 06/04/2019   History of seizure 06/04/2019   Stroke ([place]) 04/01/2019   Acute appendicitis 07/10/2015   S/P laparoscopic appendectomy 07/10/2015    Appendicitis, acute    Diabetes mellitus with complication (HCC)    Chronic congestive heart failure with left ventricular diastolic dysfunction (HCC)    Lactic acidosis    Acute respiratory failure with hypoxia (HCC)     PMH: Past Medical History:  Diagnosis Date   CHF (congestive heart failure) (HCC)    diastolic (echo at [facility]8) EF 55-60%   Cirrhosis (HCC)    NASH per records   COPD (chronic obstructive pulmonary disease) ([place])    Depression    Diabetes mellitus with neuropathy ([place])    Heart attack (HCC)    Neuropathy    OSA (obstructive sleep apnea)    Peripheral vascular disease due to secondary diabetes mellitus ([place])    Splenomegaly    Temporal giant cell arteritis ([place])    TIA (transient ischemic attack) April 2016    PSH: Past Surgical History:  Procedure Laterality Date   AORTA - FEMORAL ARTERY BYPASS GRAFT Bilateral    CARDIAC CATHETERIZATION     cardiac stents  July 2015    CORONARY STENT INTERVENTION N/A 08/09/2017   Procedure: CORONARY STENT INTERVENTION;  Surgeon: Wellington Hampshire, MD;  Location: [facility];  Service: Cardiovascular;  Laterality: N/A;   LAPAROSCOPIC APPENDECTOMY N/A 07/10/2015   Procedure: APPENDECTOMY LAPAROSCOPIC;  Surgeon: Rolm Bookbinder, MD;  Location: [facility];  Service: General;  Laterality: N/A;   LEFT HEART CATH AND CORONARY ANGIOGRAPHY N/A 08/09/2017   Procedure: LEFT HEART CATH AND CORONARY ANGIOGRAPHY;  Surgeon: Wellington Hampshire,  MD;  Location: [facility];  Service: Cardiovascular;  Laterality: N/A;   LEFT HEART CATH AND CORONARY ANGIOGRAPHY N/A 10/19/2019   Procedure: LEFT HEART CATH AND CORONARY ANGIOGRAPHY;  Surgeon: Minna Merritts, MD;  Location: [facility];  Service: Cardiovascular;  Laterality: N/A;    ALLERGIES: Allergies  Allergen Reactions   Gabapentin    Ace Inhibitors Cough    Other reaction(s): Other (See Comments) cough    MEDICATIONS: Current Facility-Administered  Medications  Medication Dose Route Frequency Provider Last Rate Last Admin   acetaminophen (TYLENOL) tablet 650 mg  650 mg Oral Q4H PRN Barrett, Evelene Croon, PA-C       ALPRAZolam Duanne Moron) tablet 0.25 mg  0.25 mg Oral BID PRN Barrett, Evelene Croon, PA-C       amLODipine (NORVASC) tablet 10 mg  10 mg Oral Daily Bhagat, Bhavinkumar, PA   10 mg at 10/23/19 1018   aspirin EC tablet 81 mg  81 mg Oral Daily Barrett, Rhonda G, PA-C   81 mg at 10/23/19 1017   atorvastatin (LIPITOR) tablet 80 mg  80 mg Oral q1800 Barrett, Rhonda G, PA-C   80 mg at 10/23/19 1703   carvedilol (COREG) tablet 25 mg  25 mg Oral BID WC Skeet Latch, MD   25 mg at 10/23/19 1703   heparin ADULT infusion 100 units/mL (25000 units/211m sodium chloride 0.45%)  1,300 Units/hr Intravenous Continuous CDonnamae Jude RPH 13 mL/hr at 10/23/19 1707 1,300 Units/hr at 10/23/19 1707   insulin aspart (novoLOG) injection 0-15 Units  0-15 Units Subcutaneous TID WC ACherlynn KaiserA, MD   8 Units at 10/23/19 1704   insulin aspart (novoLOG) injection 0-5 Units  0-5 Units Subcutaneous QHS AElouise Munroe MD   2 Units at 10/22/19 2124   insulin aspart (novoLOG) injection 5 Units  5 Units Subcutaneous TID WC Bhagat, Bhavinkumar, PA   5 Units at 10/23/19 1704   insulin glargine (LANTUS) injection 10 Units  10 Units Subcutaneous Daily RSkeet Latch MD   10 Units at 10/23/19 1017   isosorbide mononitrate (IMDUR) 24 hr tablet 30 mg  30 mg Oral Daily RSkeet Latch MD   30 mg at 10/23/19 1018   levETIRAcetam (KEPPRA) tablet 500 mg  500 mg Oral TID Barrett, REvelene Croon PA-C   500 mg at 10/23/19 2159   nitroGLYCERIN (NITROSTAT) SL tablet 0.4 mg  0.4 mg Sublingual Q5 Min x 3 PRN Barrett, REvelene Croon PA-C       omega-3 acid ethyl esters (LOVAZA) capsule 1 g  1 g Oral BID Bhagat, Bhavinkumar, PA   1 g at 10/23/19 2159   ondansetron (ZOFRAN) injection 4 mg  4 mg Intravenous Q6H PRN Barrett, Rhonda G, PA-C       oxyCODONE (Oxy IR/ROXICODONE) immediate  release tablet 5 mg  5 mg Oral Q4H PRN Barrett, Rhonda G, PA-C   5 mg at 10/23/19 1711   pantoprazole (PROTONIX) EC tablet 40 mg  40 mg Oral Daily Barrett, Rhonda G, PA-C   40 mg at 10/23/19 1017   pregabalin (LYRICA) capsule 50 mg  50 mg Oral TID Barrett, Rhonda G, PA-C   50 mg at 10/23/19 2159   venlafaxine (EFFEXOR) tablet 75 mg  75 mg Oral TID WC Barrett, Rhonda G, PA-C   75 mg at 10/23/19 1826    LABS: Lab Results  Component Value Date   WBC 6.5 10/24/2019   HGB 12.3 (L) 10/24/2019   HCT 34.8 (L) 10/24/2019  MCV 92.8 10/24/2019   PLT 132 (L) 10/24/2019      Component Value Date/Time   NA 137 10/24/2019 0320   NA 137 11/06/2014 1356   K 4.4 10/24/2019 0320   K 3.5 11/06/2014 1356   CL 101 10/24/2019 0320   CL 104 11/06/2014 1356   CO2 27 10/24/2019 0320   CO2 25 11/06/2014 1356   GLUCOSE 178 (H) 10/24/2019 0320   GLUCOSE 214 (H) 11/06/2014 1356   BUN 27 (H) 10/24/2019 0320   BUN 20 11/06/2014 1356   CREATININE 1.54 (H) 10/24/2019 0320   CREATININE 1.04 11/06/2014 1356   CALCIUM 9.1 10/24/2019 0320   CALCIUM 9.4 11/06/2014 1356   GFRNONAA 48 (L) 10/24/2019 0320   GFRNONAA >60 11/06/2014 1356   GFRAA 56 (L) 10/24/2019 0320   GFRAA >60 11/06/2014 1356   Lab Results  Component Value Date   INR 1.0 10/19/2019   INR 1.1 06/04/2019   INR 1.0 04/01/2019   No results found for: PTT  SOCIAL HISTORY: Social History   Socioeconomic History   Marital status: Divorced    Spouse name: Not on file   Number of children: Not on file   Years of education: Not on file   Highest education level: Not on file  Occupational History   Not on file  Tobacco Use   Smoking status: Former Smoker    Types: Cigarettes    Quit date: 2009    Years since quitting: 12.2   Smokeless tobacco: Never Used  Substance and Sexual Activity   Alcohol use: No    Alcohol/week: 0.0 standard drinks   Drug use: No   Sexual activity: Not on file  Other Topics Concern   Not on file  Social  History Narrative   Not on file   Social Determinants of Health   Financial Resource Strain:    Difficulty of Paying Living Expenses:   Food Insecurity:    Worried About Charity fundraiser in the Last Year:    Arboriculturist in the Last Year:   Transportation Needs:    Film/video editor (Medical):    Lack of Transportation (Non-Medical):   Physical Activity:    Days of Exercise per Week:    Minutes of Exercise per Session:   Stress:    Feeling of Stress :   Social Connections:    Frequency of Communication with Friends and Family:    Frequency of Social Gatherings with Friends and Family:    Attends Religious Services:    Active Member of Clubs or Organizations:    Attends Music therapist:    Marital Status:   Intimate Partner Violence:    Fear of Current or Ex-Partner:    Emotionally Abused:    Physically Abused:    Sexually Abused:     FAMILY HISTORY: Family History  Problem Relation Age of Onset   Heart attack Mother 74   Stroke Mother    Heart attack Father    Alzheimer's disease Father     REVIEW OF SYSTEMS: Reviewed with the patient as per History of present illness. Psych: Patient denies having dental phobia.  DENTAL HISTORY: CHIEF COMPLAINT: Patient referred by Dr. Orvan Seen for a dental consultation.  HPI: Larry Terrell is a 59 year old male recently diagnosed with unstable angina and significant coronary artery disease.  Patient with anticipated three-vessel coronary bypass graft procedure with Dr. Orvan Seen.  Dental consultation requested to evaluate poor dentition.  The patient currently  denies having any acute toothaches, swellings, or abscesses.  Patient was last seen by dentist " a long time ago".  Patient indicates that this was at least 5 years ago.  Patient had a root canal therapy performed at that time by a dentist in [facility].  Patient cannot remember the name of the dentist.  Patient denies having partial  dentures.  Patient denies having dental phobia.  DENTAL EXAMINATION: GENERAL: The patient is well-developed, well-nourished male laying in hospital bed no acute distress. HEAD AND NECK: There is no palpable neck lymphadenopathy.  The patient denies acute TMJ symptoms. INTRAORAL EXAM: The patient has normal saliva.  There is no evidence of oral abscess formation within the mouth.  There is atrophy of the edentulous alveolar ridges. DENTITION: Patient with missing tooth numbers 1, 3, 4, 5, 11, 14, 15, 16, 17, 18, 19 and 23-32.  There are retained root segments in the area of tooth numbers 12 and 13. PERIODONTAL: Patient has chronic, advanced periodontal disease with plaque calculus accumulations, generalized gingival recession, and generalized tooth mobility.  There is moderate to severe bone loss noted. DENTAL CARIES/SUBOPTIMAL RESTORATIONS: Multiple dental caries are noted. ENDODONTIC: Patient currently denies acute pulpitis symptoms.  There is evidence of periapical pathology.  Patient has had previous root canal therapies associated with tooth numbers 12 and 13.  These root canal therapies are now exposed to the oral environment. CROWN AND BRIDGE: There are no crown restorations noted. PROSTHODONTIC: Patient denies having partial dentures. OCCLUSION: Patient has a poor occlusal scheme secondary to multiple missing teeth, multiple retained root segments, multiple diastemas, supra eruption and drifting of the unopposed teeth into the edentulous areas, and lack of replacement of the missing teeth with dental prostheses.  RADIOGRAPHIC INTERPRETATION: An orthopantogram was taken on 10/22/2019. There are multiple missing teeth.  There are multiple retained root segments.  There is supra eruption and drifting of the unopposed teeth into the edentulous areas.  Dental caries are noted.  There is moderate to severe bone loss.  Multiple diastemas are noted.  There is periapical pathology noted.  There are  previous root canal therapies of tooth numbers 12 and 13 now exposed to the oral environment.   ASSESSMENTS: 1.  History of unstable angina 2.  Coronary artery disease 3.  Pre-coronary artery bypass graft procedure 4.  Chronic apical periodontitis 5.  Multiple retained root segments 6.  Dental caries 7.  Chronic periodontitis with bone loss 8.  Gingival recession 9.  Accretions 10.  Tooth mobility 11.  Multiple missing teeth 12.  Multiple diastemas 13.  No history of partial dentures 14.  Poor occlusal scheme and malocclusion 15.  Risk for bleeding with invasive dental procedures due to history of Plavix therapy and current heparin therapy 16.  Risk for complications up to and including death with anticipated invasive dental procedures due to the patient's significant coronary artery disease.     PLAN/RECOMMENDATIONS: 1. I discussed the risks, benefits, and complications of various treatment options with the patient in relationship to his medical and dental conditions, coronary artery disease, and anticipated coronary artery bypass graft procedure. We discussed various treatment options to include no treatment, multiple extractions with alveoloplasty, pre-prosthetic surgery as indicated, periodontal therapy, dental restorations, root canal therapy, crown and bridge therapy, implant therapy, and replacement of missing teeth as indicated. The patient currently wishes to proceed with extraction of all remaining teeth with alveoloplasty.  Due to the patient's significant coronary disease, I would not recommend dental extractions  until after the coronary artery bypass graft procedure.  I will discuss proceeding with the dental extractions with Dr. Orvan Seen to determine if extractions can be performed later during this admission.  If not, the patient can follow-up with the oral surgeon of his choice for extraction of remaining teeth as an outpatient once he is medically stable from the anticipated  coronary artery bypass graft procedure.  The patient does have Dental Medicaid that will cover the cost of extractions as an outpatient.  Patient will then need to follow-up with a dentist of choice for fabrication of upper and lower complete dentures after adequate healing.  This needs to be done by a dentist that is enrolled with Dental Medicaid insurance.    2. Discussion of findings with medical team and coordination of future medical and dental care as needed.    Lenn Cal, DDS

## 2019-10-25 LAB — BASIC METABOLIC PANEL
Anion gap: 11 (ref 5–15)
BUN: 26 mg/dL — ABNORMAL HIGH (ref 8–23)
CO2: 25 mmol/L (ref 22–32)
Calcium: 9.1 mg/dL (ref 8.9–10.3)
Chloride: 99 mmol/L (ref 98–111)
Creatinine, Ser: 1.59 mg/dL — ABNORMAL HIGH (ref 0.61–1.24)
GFR calc Af Amer: 54 mL/min — ABNORMAL LOW (ref >60)
GFR calc non Af Amer: 46 mL/min — ABNORMAL LOW (ref >60)
Glucose, Bld: 177 mg/dL — ABNORMAL HIGH (ref 70–99)
Potassium: 4.6 mmol/L (ref 3.5–5.1)
Sodium: 135 mmol/L (ref 135–145)

## 2019-10-25 LAB — CBC
HCT: 34.6 % — ABNORMAL LOW (ref 39.0–52.0)
Hemoglobin: 12.1 g/dL — ABNORMAL LOW (ref 13.0–17.0)
MCH: 33.2 pg (ref 26.0–34.0)
MCHC: 35 g/dL (ref 30.0–36.0)
MCV: 94.8 fL (ref 80.0–100.0)
Platelets: 121 10*3/uL — ABNORMAL LOW (ref 150–400)
RBC: 3.65 MIL/uL — ABNORMAL LOW (ref 4.22–5.81)
RDW: 15.9 % — ABNORMAL HIGH (ref 11.5–15.5)
WBC: 7.7 10*3/uL (ref 4.0–10.5)
nRBC: 0 % (ref 0.0–0.2)

## 2019-10-25 LAB — URINALYSIS, ROUTINE W REFLEX MICROSCOPIC
Bilirubin Urine: NEGATIVE
Glucose, UA: NEGATIVE mg/dL
Hgb urine dipstick: NEGATIVE
Ketones, ur: NEGATIVE mg/dL
Leukocytes,Ua: NEGATIVE
Nitrite: NEGATIVE
Protein, ur: 100 mg/dL — AB
Specific Gravity, Urine: 1.009 (ref 1.005–1.030)
pH: 6 (ref 5.0–8.0)

## 2019-10-25 LAB — BLOOD GAS, ARTERIAL
Acid-Base Excess: 1.1 mmol/L (ref 0.0–2.0)
Bicarbonate: 25.6 mmol/L (ref 20.0–28.0)
FIO2: 21
O2 Saturation: 83 %
Patient temperature: 37
pCO2 arterial: 43.7 mmHg (ref 32.0–48.0)
pH, Arterial: 7.385 (ref 7.350–7.450)
pO2, Arterial: 51.4 mmHg — ABNORMAL LOW (ref 83.0–108.0)

## 2019-10-25 LAB — SURGICAL PCR SCREEN
MRSA, PCR: NEGATIVE
Staphylococcus aureus: NEGATIVE

## 2019-10-25 LAB — GLUCOSE, CAPILLARY
Glucose-Capillary: 164 mg/dL — ABNORMAL HIGH (ref 70–99)
Glucose-Capillary: 286 mg/dL — ABNORMAL HIGH (ref 70–99)
Glucose-Capillary: 222 mg/dL — ABNORMAL HIGH (ref 70–99)
Glucose-Capillary: 232 mg/dL — ABNORMAL HIGH (ref 70–99)

## 2019-10-25 LAB — ABO/RH: ABO/RH(D): B POS

## 2019-10-25 LAB — HEPARIN LEVEL (UNFRACTIONATED): Heparin Unfractionated: 0.29 IU/mL — ABNORMAL LOW (ref 0.30–0.70)

## 2019-10-25 MED ORDER — NOREPINEPHRINE 4 MG/250ML-% IV SOLN
0.0000 ug/min | INTRAVENOUS | Status: DC
  Filled 2019-10-25: qty 250

## 2019-10-25 MED ORDER — CHLORHEXIDINE GLUCONATE CLOTH 2 % EX PADS
6.0000 | MEDICATED_PAD | Freq: Once | CUTANEOUS | Status: AC
  Administered 2019-10-26: 05:00:00 6 via TOPICAL

## 2019-10-25 MED ORDER — MAGNESIUM SULFATE 50 % IJ SOLN
40.0000 meq | INTRAMUSCULAR | Status: DC
  Filled 2019-10-25: qty 9.85

## 2019-10-25 MED ORDER — TRANEXAMIC ACID (OHS) PUMP PRIME SOLUTION
2.0000 mg/kg | INTRAVENOUS | Status: DC
  Filled 2019-10-25: qty 1.81

## 2019-10-25 MED ORDER — PHENYLEPHRINE HCL-NACL 20-0.9 MG/250ML-% IV SOLN
30.0000 ug/min | INTRAVENOUS | Status: DC
  Filled 2019-10-25: qty 250

## 2019-10-25 MED ORDER — BISACODYL 5 MG PO TBEC
5.0000 mg | DELAYED_RELEASE_TABLET | Freq: Once | ORAL | Status: AC
  Administered 2019-10-25: 5 mg via ORAL
  Filled 2019-10-25: qty 1

## 2019-10-25 MED ORDER — DEXMEDETOMIDINE HCL IN NACL 400 MCG/100ML IV SOLN
0.1000 ug/kg/h | INTRAVENOUS | Status: AC
  Administered 2019-10-26: .2 ug/kg/h via INTRAVENOUS
  Filled 2019-10-25: qty 100

## 2019-10-25 MED ORDER — INSULIN REGULAR(HUMAN) IN NACL 100-0.9 UT/100ML-% IV SOLN
INTRAVENOUS | Status: AC
  Administered 2019-10-26: 09:00:00 6.5 [IU]/h via INTRAVENOUS
  Filled 2019-10-25: qty 100

## 2019-10-25 MED ORDER — VANCOMYCIN HCL 1000 MG IV SOLR
INTRAVENOUS | Status: AC
  Administered 2019-10-26: 1500 mL
  Filled 2019-10-25: qty 1000

## 2019-10-25 MED ORDER — METOPROLOL TARTRATE 12.5 MG HALF TABLET
12.5000 mg | ORAL_TABLET | Freq: Once | ORAL | Status: AC
  Administered 2019-10-26: 12.5 mg via ORAL
  Filled 2019-10-25: qty 1

## 2019-10-25 MED ORDER — TRANEXAMIC ACID 1000 MG/10ML IV SOLN
1.5000 mg/kg/h | INTRAVENOUS | Status: AC
  Administered 2019-10-26: 1.5 mg/kg/h via INTRAVENOUS
  Filled 2019-10-25: qty 25

## 2019-10-25 MED ORDER — NITROGLYCERIN IN D5W 200-5 MCG/ML-% IV SOLN
2.0000 ug/min | INTRAVENOUS | Status: AC
  Administered 2019-10-26: 08:00:00 5 ug/min via INTRAVENOUS
  Filled 2019-10-25: qty 250

## 2019-10-25 MED ORDER — SODIUM CHLORIDE 0.9 % IV SOLN
1.5000 g | INTRAVENOUS | Status: AC
  Administered 2019-10-26 (×2): 1.5 g via INTRAVENOUS
  Filled 2019-10-25: qty 1.5

## 2019-10-25 MED ORDER — POTASSIUM CHLORIDE 2 MEQ/ML IV SOLN
80.0000 meq | INTRAVENOUS | Status: DC
  Filled 2019-10-25: qty 40

## 2019-10-25 MED ORDER — SODIUM CHLORIDE 0.9 % IV SOLN
750.0000 mg | INTRAVENOUS | Status: DC
  Filled 2019-10-25: qty 750

## 2019-10-25 MED ORDER — CHLORHEXIDINE GLUCONATE CLOTH 2 % EX PADS
6.0000 | MEDICATED_PAD | Freq: Once | CUTANEOUS | Status: AC
  Administered 2019-10-25: 6 via TOPICAL

## 2019-10-25 MED ORDER — TEMAZEPAM 15 MG PO CAPS
15.0000 mg | ORAL_CAPSULE | Freq: Once | ORAL | Status: AC | PRN
  Administered 2019-10-25: 15 mg via ORAL
  Filled 2019-10-25: qty 1

## 2019-10-25 MED ORDER — EPINEPHRINE HCL 5 MG/250ML IV SOLN IN NS
0.0000 ug/min | INTRAVENOUS | Status: AC
  Administered 2019-10-26: 3 ug/min via INTRAVENOUS
  Filled 2019-10-25: qty 250

## 2019-10-25 MED ORDER — VANCOMYCIN HCL 1500 MG/300ML IV SOLN
1500.0000 mg | INTRAVENOUS | Status: DC
  Filled 2019-10-25: qty 300

## 2019-10-25 MED ORDER — MILRINONE LACTATE IN DEXTROSE 20-5 MG/100ML-% IV SOLN
0.3000 ug/kg/min | INTRAVENOUS | Status: AC
  Administered 2019-10-26: 14:00:00 .2 ug/kg/min via INTRAVENOUS
  Filled 2019-10-25: qty 100

## 2019-10-25 MED ORDER — TRANEXAMIC ACID (OHS) BOLUS VIA INFUSION
15.0000 mg/kg | INTRAVENOUS | Status: AC
  Administered 2019-10-26: 09:00:00 1360.5 mg via INTRAVENOUS
  Filled 2019-10-25: qty 1361

## 2019-10-25 MED ORDER — CHLORHEXIDINE GLUCONATE 0.12 % MT SOLN
15.0000 mL | Freq: Once | OROMUCOSAL | Status: AC
  Administered 2019-10-26: 15 mL via OROMUCOSAL
  Filled 2019-10-25: qty 15

## 2019-10-25 MED ORDER — PLASMA-LYTE 148 IV SOLN
INTRAVENOUS | Status: DC
  Filled 2019-10-25: qty 2.5

## 2019-10-25 MED ORDER — SODIUM CHLORIDE 0.9 % IV SOLN
INTRAVENOUS | Status: DC
  Filled 2019-10-25: qty 30

## 2019-10-25 NOTE — Progress Notes (Signed)
CARDIAC REHAB PHASE I   PRE:  Rate/Rhythm: 37 SR    BP: sitting 93/61    SaO2: 93 2L  MODE:  Ambulation: to door   POST:  Rate/Rhythm: 71 SR    BP: sitting 100/61     SaO2: 96 2L  Pt BP lower today, received meds earlier. Took a few steps and c/o lightheadedness. Wanted to try farther but continued lightheadedness and felt somewhat weak (hard to tell by how he is moving as he generally takes small and stiff steps). Had him sit at door and rolled back. Left in recliner. BP sitting low but stable. Encouraged IS and sitting up today. [phone]  [place], ACSM 10/25/2019 10:57 AM

## 2019-10-25 NOTE — Anesthesia Preprocedure Evaluation (Addendum)
Anesthesia Evaluation  Patient identified by MRN, date of birth, ID band Patient awake    Reviewed: Allergy & Precautions, NPO status , Patient's Chart, lab work & pertinent test results  Airway Mallampati: III  TM Distance: >3 FB Neck ROM: Full    Dental  (+) Dental Advisory Given   Pulmonary sleep apnea , COPD, former smoker,    breath sounds clear to auscultation       Cardiovascular hypertension, Pt. on medications and Pt. on home beta blockers + angina + CAD, + Past MI, + Peripheral Vascular Disease and +CHF   Rhythm:Regular Rate:Normal  IMPRESSIONS    1. Left ventricular ejection fraction, by estimation, is 55%. The left  ventricle has low normal function. Grossly, no significant wall motion  abnormality. There is mild left ventricular hypertrophy of the septal  segment. Grade II diastolic dysfunction.  2. Right ventricular systolic function is normal. The right ventricular  size is normal. Tricuspid regurgitation signal is inadequate for assessing  PA pressure.  3. Left atrial size was moderately dilated.    Neuro/Psych TIACVA    GI/Hepatic Neg liver ROS, GERD  ,  Endo/Other  diabetes, Type 2, Insulin Dependent  Renal/GU Renal InsufficiencyRenal disease     Musculoskeletal   Abdominal   Peds  Hematology  (+) Blood dyscrasia, anemia ,   Anesthesia Other Findings   Reproductive/Obstetrics                            Lab Results  Component Value Date   WBC 7.7 10/25/2019   HGB 12.1 (L) 10/25/2019   HCT 34.6 (L) 10/25/2019   MCV 94.8 10/25/2019   PLT 121 (L) 10/25/2019   Lab Results  Component Value Date   CREATININE 1.59 (H) 10/25/2019   BUN 26 (H) 10/25/2019   NA 135 10/25/2019   K 4.6 10/25/2019   CL 99 10/25/2019   CO2 25 10/25/2019    Anesthesia Physical Anesthesia Plan  ASA: IV  Anesthesia Plan: General   Post-op Pain Management:    Induction:  Intravenous  PONV Risk Score and Plan: 2 and Dexamethasone, Ondansetron and Treatment may vary due to age or medical condition  Airway Management Planned: Oral ETT  Additional Equipment: Arterial line, CVP, PA Cath, TEE and Ultrasound Guidance Line Placement  Intra-op Plan:   Post-operative Plan: Post-operative intubation/ventilation  Informed Consent: I have reviewed the patients History and Physical, chart, labs and discussed the procedure including the risks, benefits and alternatives for the proposed anesthesia with the patient or authorized representative who has indicated his/her understanding and acceptance.     Dental advisory given  Plan Discussed with: CRNA  Anesthesia Plan Comments:        Anesthesia Quick Evaluation

## 2019-10-25 NOTE — Progress Notes (Signed)
Arterial gas drawn on room air. Placed patient back on 2lpm after sample was obtained.

## 2019-10-25 NOTE — Progress Notes (Addendum)
Progress Note  Patient Name: Larry Terrell Date of Encounter: 10/25/2019  Primary Cardiologist: Mertie Moores, MD   Subjective   Feels well this morning. No chest pain.   Inpatient Medications    Scheduled Meds: . amLODipine  10 mg Oral Daily  . aspirin EC  81 mg Oral Daily  . atorvastatin  80 mg Oral q1800  . carvedilol  25 mg Oral BID WC  . [START ON 10/26/2019] chlorhexidine  15 mL Mouth/Throat Once  . Chlorhexidine Gluconate Cloth  6 each Topical Once   And  . [START ON 10/26/2019] Chlorhexidine Gluconate Cloth  6 each Topical Once  . insulin aspart  0-15 Units Subcutaneous TID WC  . insulin aspart  0-5 Units Subcutaneous QHS  . insulin aspart  5 Units Subcutaneous TID WC  . insulin glargine  10 Units Subcutaneous Daily  . isosorbide mononitrate  30 mg Oral Daily  . levETIRAcetam  500 mg Oral TID  . [START ON 10/26/2019] metoprolol tartrate  12.5 mg Oral Once  . omega-3 acid ethyl esters  1 g Oral BID  . pantoprazole  40 mg Oral Daily  . pregabalin  50 mg Oral TID  . venlafaxine  75 mg Oral TID WC   Continuous Infusions: . heparin 1,300 Units/hr (10/24/19 1424)   PRN Meds: acetaminophen, ALPRAZolam, nitroGLYCERIN, ondansetron (ZOFRAN) IV, oxyCODONE, temazepam   Vital Signs    Vitals:   10/24/19 2050 10/25/19 0341 10/25/19 0500 10/25/19 0854  BP: (!) 128/56 (!) 149/67  (!) 144/61  Pulse: 76 77  69  Resp: 18 17    Temp: 98 F (36.7 C)  98.2 F (36.8 C)   TempSrc: Oral  Oral   SpO2: 90% 95%    Weight:   90.7 kg   Height:        Intake/Output Summary (Last 24 hours) at 10/25/2019 0916 Last data filed at 10/25/2019 0816 Gross per 24 hour  Intake 966.48 ml  Output 1300 ml  Net -333.52 ml   Last 3 Weights 10/25/2019 10/24/2019 10/23/2019  Weight (lbs) 199 lb 14.4 oz 201 lb 4.5 oz 197 lb 12.8 oz  Weight (kg) 90.674 kg 91.3 kg 89.721 kg      Telemetry    SR - Personally Reviewed  ECG    No new tracing.  Physical Exam  Pleasant WM, sitting up in  bed. GEN: No acute distress.   Neck: No JVD Cardiac: RRR, no murmurs, rubs, or gallops.  Respiratory: Clear to auscultation bilaterally. GI: Soft, nontender, non-distended  MS: No edema; No deformity. Neuro:  Nonfocal  Psych: Normal affect   Labs    High Sensitivity Troponin:   Recent Labs  Lab 10/18/19 1526 10/18/19 1832 10/19/19 0418 10/20/19 0726  TROPONINIHS 37* 50* 33* 24*      Chemistry Recent Labs  Lab 10/20/19 0726 10/21/19 0400 10/23/19 0319 10/24/19 0320 10/25/19 0407  NA 133*   < > 136 137 135  K 4.2   < > 4.4 4.4 4.6  CL 100   < > 98 101 99  CO2 24   < > 28 27 25   GLUCOSE 292*   < > 244* 178* 177*  BUN 27*   < > 30* 27* 26*  CREATININE 1.36*   < > 1.57* 1.54* 1.59*  CALCIUM 8.6*   < > 8.8* 9.1 9.1  PROT 6.2*  --   --   --   --   ALBUMIN 3.4*  --   --   --   --  AST 27  --   --   --   --   ALT 29  --   --   --   --   ALKPHOS 87  --   --   --   --   BILITOT 1.2  --   --   --   --   GFRNONAA 56*   < > 47* 48* 46*  GFRAA >60   < > 54* 56* 54*  ANIONGAP 9   < > 10 9 11    < > = values in this interval not displayed.     Hematology Recent Labs  Lab 10/23/19 0319 10/24/19 0320 10/25/19 0407  WBC 6.4 6.5 7.7  RBC 3.58* 3.75* 3.65*  HGB 11.7* 12.3* 12.1*  HCT 33.2* 34.8* 34.6*  MCV 92.7 92.8 94.8  MCH 32.7 32.8 33.2  MCHC 35.2 35.3 35.0  RDW 15.1 15.3 15.9*  PLT 121* 132* 121*    BNP Recent Labs  Lab 10/18/19 2055  BNP 177.0*     DDimer No results for input(s): DDIMER in the last 168 hours.   Radiology    No results found.  Cardiac Studies   LEFT HEART CATH AND CORONARY ANGIOGRAPHY4/8/21  Conclusion   Balloon angioplasty was performed.  Previously placed Mid Cx to Dist Cx drug eluting stent is widely patent.  Balloon angioplasty was performed.  Mid RCA lesion is 80% stenosed.  Mid RCA to Dist RCA lesion is 10% stenosed.  Prox Cx lesion is 100% stenosed.  Ost Ramus lesion is 80% stenosed.  Prox LAD lesion is 95%  stenosed.  Dist LAD lesion is 45% stenosed.  Mid LM lesion is 30% stenosed.  Ost RPDA lesion is 80% stenosed.  The left ventricular ejection fraction is 50-55% by visual estimate.  LV end diastolic pressure is mildly elevated.  There is mild left ventricular systolic dysfunction.    Diagnostic Dominance: Right     Echo 10/20/19 1. Left ventricular ejection fraction, by estimation, is 55%. The left  ventricle has low normal function. Grossly, no significant wall motion  abnormality. There is mild left ventricular hypertrophy of the septal  segment. Grade II diastolic dysfunction.  2. Right ventricular systolic function is normal. The right ventricular  size is normal. Tricuspid regurgitation signal is inadequate for assessing  PA pressure.  3. Left atrial size was moderately dilated.   Patient Profile     59 y.o. male with hx of CAD s/p RCA and LCx stenting,chronic diastolic heart failure, PAD s/paortobifemoral bypass in 2003 with known stenosis of the bilateral SFAs,TIA,DM2 with diabetic polyneuropathy, HTN, HLD,seizure disorder, chronic back pain,and prior tobacco usewhowas [facility] with left shoulder pain concerning for anginal equivalent and underwent diagnostic LHC which revealed severe multi-vessel CAD with recommendation for transfer to [facility] for evaluation of CABG.  Assessment & Plan    1. Unstable angina/CAD: Cath withsevere three-vessel disease including severe proximal LAD, severe mid RCA with some in-stent restenosis and severe distal RCA. His proximal to mid left circumflex had in-stent restenosis and was totally occluded.LVEF normal. Last dose of Plavix 4/7. For CABG on 4/15.  -Continue aspirin, Lipitor, Coreg,Imdur and heparin  2.Chronic diastolic heart failure: Net - 3.8L, weight is stable. Does not appear volume overloaded on exam. Will hold on additional IV lasix.  3.AKI: [facility] 1.[facility]10 post cardiac catheterization. Given IV lasix with mild increase to 1.57>>1.54>>1.59  4.Hypertension: stable with  current therapy. -Continue Coreg 25 mg twice daily, Norvasc to 10 mg daily -Home losartan on hold given AKI  5.Hyperlipidemia: -10/19/2019: Cholesterol 160; HDL 23; LDL Cholesterol UNABLE TO CALCULATE IF TRIGLYCERIDE OVER 400 mg/dL; Triglycerides 482; VLDL UNABLE TO CALCULATE IF TRIGLYCERIDE OVER 400 mg/dL - LDL was 72 on 4/8 -Continue Lipitor 80 mg(increased this admission) - Started on Lovaza, Vascepa check $9 for 30 day supply  6. DM: - A1c 7.3 on 4/8 - SSI  7. Dental eval: seen by Dr. Enrique Sack with recommendation to wait until after CABG to have dental extractions done.   For questions or updates, please contact [place] Please consult [email or web address] for contact info under        Signed, Reino Bellis, NP  10/25/2019, 9:16 AM    Agree with note by Reino Bellis NP-C  Cardiac stable, no chest pain.  Scheduled for CABG tomorrow.  Telemetry shows sinus rhythm.  Exam is benign.  Lorretta Harp, M.D., [place], [facility], [place] [address] [place], [place]  [address]  [phone] 10/25/2019 10:28 AM

## 2019-10-25 NOTE — Consult Note (Signed)
ANTICOAGULATION CONSULT NOTE  Pharmacy Consult for Heparin Indication: chest pain/ACS  Patient Measurements: Height: 5' 7"  (170.2 cm) Weight: 90.7 kg (199 lb 14.4 oz) IBW/kg (Calculated) : 66.1 Heparin Dosing Weight: 84.6 kg  Vital Signs: Temp: 98.2 F (36.8 C) (04/14 0500) Temp Source: Oral (04/14 0500) BP: 144/61 (04/14 0854) Pulse Rate: 69 (04/14 0854)  Labs: Recent Labs    10/23/19 0319 10/23/19 0319 10/24/19 0320 10/25/19 0407  HGB 11.7*   < > 12.3* 12.1*  HCT 33.2*  --  34.8* 34.6*  PLT 121*  --  132* 121*  HEPARINUNFRC 0.38  --  0.31 0.29*  CREATININE 1.57*  --  1.54* 1.59*   < > = values in this interval not displayed.    Estimated Creatinine Clearance: 52.4 mL/min (A) (by C-G formula based on SCr of 1.59 mg/dL (H)).  Assessment: 59 yo M presenting with CP to [facility] on heparin per pharmacy for ACS and transferred to [facility] for CABG evaluation after cath 4/8 showed severe 3-vessel CAD. Plans for CABG on 4/15 -platelet count= 120-130, h/h stable no bleeding noted -heparin level 0.29 at low end of goal on heparin drip rate 1300 uts/hr - has been stable on this rate and no chest pain - no change   Goal of Therapy:  Heparin level 0.3-0.7 units/ml Monitor platelets by anticoagulation protocol: Yes   Plan:  Continue heparin at 1300 units/hr Monitor daily heparin level and CBC Planned CABG 4/14  Bonnita Nasuti Pharm.D. CPP, BCPS Clinical Pharmacist [phone] 10/25/2019 9:28 AM    **Pharmacist phone directory can now be found on [email or web address] ([email or web address]).  Listed under [place].

## 2019-10-26 ENCOUNTER — Inpatient Hospital Stay (HOSPITAL_COMMUNITY): Payer: Medicare HMO | Admitting: Certified Registered"

## 2019-10-26 ENCOUNTER — Inpatient Hospital Stay (HOSPITAL_COMMUNITY): Payer: Medicare HMO

## 2019-10-26 ENCOUNTER — Inpatient Hospital Stay (HOSPITAL_COMMUNITY)
Admission: AD | Disposition: A | Discharge status: Skilled Nursing Facility | Payer: Self-pay | Source: Other Acute Inpatient Hospital | Attending: Cardiothoracic Surgery

## 2019-10-26 DIAGNOSIS — I2510 Atherosclerotic heart disease of native coronary artery without angina pectoris: Secondary | ICD-10-CM

## 2019-10-26 HISTORY — PX: TEE WITHOUT CARDIOVERSION: SHX5443

## 2019-10-26 HISTORY — PX: CORONARY ARTERY BYPASS GRAFT: SHX141

## 2019-10-26 HISTORY — PX: RADIAL ARTERY HARVEST: SHX5067

## 2019-10-26 LAB — BASIC METABOLIC PANEL
Anion gap: 12 (ref 5–15)
Anion gap: 7 (ref 5–15)
BUN: 25 mg/dL — ABNORMAL HIGH (ref 8–23)
BUN: 25 mg/dL — ABNORMAL HIGH (ref 8–23)
CO2: 24 mmol/L (ref 22–32)
CO2: 23 mmol/L (ref 22–32)
Calcium: 9.3 mg/dL (ref 8.9–10.3)
Calcium: 7.3 mg/dL — ABNORMAL LOW (ref 8.9–10.3)
Chloride: 100 mmol/L (ref 98–111)
Chloride: 106 mmol/L (ref 98–111)
Creatinine, Ser: 1.54 mg/dL — ABNORMAL HIGH (ref 0.61–1.24)
Creatinine, Ser: 1.55 mg/dL — ABNORMAL HIGH (ref 0.61–1.24)
GFR calc Af Amer: 56 mL/min — ABNORMAL LOW (ref >60)
GFR calc Af Amer: 55 mL/min — ABNORMAL LOW (ref >60)
GFR calc non Af Amer: 48 mL/min — ABNORMAL LOW (ref >60)
GFR calc non Af Amer: 48 mL/min — ABNORMAL LOW (ref >60)
Glucose, Bld: 192 mg/dL — ABNORMAL HIGH (ref 70–99)
Glucose, Bld: 195 mg/dL — ABNORMAL HIGH (ref 70–99)
Potassium: 4.8 mmol/L (ref 3.5–5.1)
Potassium: 5.9 mmol/L — ABNORMAL HIGH (ref 3.5–5.1)
Sodium: 136 mmol/L (ref 135–145)
Sodium: 136 mmol/L (ref 135–145)

## 2019-10-26 LAB — CBC
HCT: 38.6 % — ABNORMAL LOW (ref 39.0–52.0)
HCT: 37 % — ABNORMAL LOW (ref 39.0–52.0)
HCT: 32.7 % — ABNORMAL LOW (ref 39.0–52.0)
Hemoglobin: 13.4 g/dL (ref 13.0–17.0)
Hemoglobin: 12.6 g/dL — ABNORMAL LOW (ref 13.0–17.0)
Hemoglobin: 11.1 g/dL — ABNORMAL LOW (ref 13.0–17.0)
MCH: 33.6 pg (ref 26.0–34.0)
MCH: 31.8 pg (ref 26.0–34.0)
MCH: 31.6 pg (ref 26.0–34.0)
MCHC: 34.7 g/dL (ref 30.0–36.0)
MCHC: 34.1 g/dL (ref 30.0–36.0)
MCHC: 33.9 g/dL (ref 30.0–36.0)
MCV: 96.7 fL (ref 80.0–100.0)
MCV: 93.4 fL (ref 80.0–100.0)
MCV: 93.2 fL (ref 80.0–100.0)
Platelets: 149 10*3/uL — ABNORMAL LOW (ref 150–400)
Platelets: 143 10*3/uL — ABNORMAL LOW (ref 150–400)
Platelets: 142 10*3/uL — ABNORMAL LOW (ref 150–400)
RBC: 3.99 MIL/uL — ABNORMAL LOW (ref 4.22–5.81)
RBC: 3.96 MIL/uL — ABNORMAL LOW (ref 4.22–5.81)
RBC: 3.51 MIL/uL — ABNORMAL LOW (ref 4.22–5.81)
RDW: 16.4 % — ABNORMAL HIGH (ref 11.5–15.5)
RDW: 17.1 % — ABNORMAL HIGH (ref 11.5–15.5)
RDW: 17.4 % — ABNORMAL HIGH (ref 11.5–15.5)
WBC: 8.7 10*3/uL (ref 4.0–10.5)
WBC: 18.4 10*3/uL — ABNORMAL HIGH (ref 4.0–10.5)
WBC: 16.8 10*3/uL — ABNORMAL HIGH (ref 4.0–10.5)
nRBC: 0 % (ref 0.0–0.2)
nRBC: 0 % (ref 0.0–0.2)
nRBC: 0 % (ref 0.0–0.2)

## 2019-10-26 LAB — POCT I-STAT 7, (LYTES, BLD GAS, ICA,H+H)
Acid-base deficit: 3 mmol/L — ABNORMAL HIGH (ref 0.0–2.0)
Acid-base deficit: 4 mmol/L — ABNORMAL HIGH (ref 0.0–2.0)
Acid-base deficit: 3 mmol/L — ABNORMAL HIGH (ref 0.0–2.0)
Acid-base deficit: 2 mmol/L (ref 0.0–2.0)
Acid-base deficit: 2 mmol/L (ref 0.0–2.0)
Acid-base deficit: 3 mmol/L — ABNORMAL HIGH (ref 0.0–2.0)
Acid-base deficit: 3 mmol/L — ABNORMAL HIGH (ref 0.0–2.0)
Acid-base deficit: 4 mmol/L — ABNORMAL HIGH (ref 0.0–2.0)
Bicarbonate: 24.2 mmol/L (ref 20.0–28.0)
Bicarbonate: 24.8 mmol/L (ref 20.0–28.0)
Bicarbonate: 24.3 mmol/L (ref 20.0–28.0)
Bicarbonate: 24.8 mmol/L (ref 20.0–28.0)
Bicarbonate: 24.1 mmol/L (ref 20.0–28.0)
Bicarbonate: 23.6 mmol/L (ref 20.0–28.0)
Bicarbonate: 23.5 mmol/L (ref 20.0–28.0)
Bicarbonate: 22.7 mmol/L (ref 20.0–28.0)
Calcium, Ion: 1.06 mmol/L — ABNORMAL LOW (ref 1.15–1.40)
Calcium, Ion: 1.1 mmol/L — ABNORMAL LOW (ref 1.15–1.40)
Calcium, Ion: 1.11 mmol/L — ABNORMAL LOW (ref 1.15–1.40)
Calcium, Ion: 1.09 mmol/L — ABNORMAL LOW (ref 1.15–1.40)
Calcium, Ion: 1.1 mmol/L — ABNORMAL LOW (ref 1.15–1.40)
Calcium, Ion: 1.14 mmol/L — ABNORMAL LOW (ref 1.15–1.40)
Calcium, Ion: 1.13 mmol/L — ABNORMAL LOW (ref 1.15–1.40)
Calcium, Ion: 1.15 mmol/L (ref 1.15–1.40)
HCT: 36 % — ABNORMAL LOW (ref 39.0–52.0)
HCT: 35 % — ABNORMAL LOW (ref 39.0–52.0)
HCT: 35 % — ABNORMAL LOW (ref 39.0–52.0)
HCT: 35 % — ABNORMAL LOW (ref 39.0–52.0)
HCT: 31 % — ABNORMAL LOW (ref 39.0–52.0)
HCT: 30 % — ABNORMAL LOW (ref 39.0–52.0)
HCT: 30 % — ABNORMAL LOW (ref 39.0–52.0)
HCT: 30 % — ABNORMAL LOW (ref 39.0–52.0)
Hemoglobin: 12.2 g/dL — ABNORMAL LOW (ref 13.0–17.0)
Hemoglobin: 11.9 g/dL — ABNORMAL LOW (ref 13.0–17.0)
Hemoglobin: 11.9 g/dL — ABNORMAL LOW (ref 13.0–17.0)
Hemoglobin: 11.9 g/dL — ABNORMAL LOW (ref 13.0–17.0)
Hemoglobin: 10.5 g/dL — ABNORMAL LOW (ref 13.0–17.0)
Hemoglobin: 10.2 g/dL — ABNORMAL LOW (ref 13.0–17.0)
Hemoglobin: 10.2 g/dL — ABNORMAL LOW (ref 13.0–17.0)
Hemoglobin: 10.2 g/dL — ABNORMAL LOW (ref 13.0–17.0)
O2 Saturation: 80 %
O2 Saturation: 99 %
O2 Saturation: 99 %
O2 Saturation: 100 %
O2 Saturation: 99 %
O2 Saturation: 89 %
O2 Saturation: 90 %
O2 Saturation: 92 %
Patient temperature: 36.3
Patient temperature: 36.3
Patient temperature: 36.3
Patient temperature: 36.4
Patient temperature: 36.6
Patient temperature: 36.7
Patient temperature: 36.9
Potassium: 5.2 mmol/L — ABNORMAL HIGH (ref 3.5–5.1)
Potassium: 5.6 mmol/L — ABNORMAL HIGH (ref 3.5–5.1)
Potassium: 5.6 mmol/L — ABNORMAL HIGH (ref 3.5–5.1)
Potassium: 5.7 mmol/L — ABNORMAL HIGH (ref 3.5–5.1)
Potassium: 5.7 mmol/L — ABNORMAL HIGH (ref 3.5–5.1)
Potassium: 5.3 mmol/L — ABNORMAL HIGH (ref 3.5–5.1)
Potassium: 5.2 mmol/L — ABNORMAL HIGH (ref 3.5–5.1)
Potassium: 5.1 mmol/L (ref 3.5–5.1)
Sodium: 138 mmol/L (ref 135–145)
Sodium: 137 mmol/L (ref 135–145)
Sodium: 137 mmol/L (ref 135–145)
Sodium: 137 mmol/L (ref 135–145)
Sodium: 135 mmol/L (ref 135–145)
Sodium: 134 mmol/L — ABNORMAL LOW (ref 135–145)
Sodium: 135 mmol/L (ref 135–145)
Sodium: 136 mmol/L (ref 135–145)
TCO2: 26 mmol/L (ref 22–32)
TCO2: 27 mmol/L (ref 22–32)
TCO2: 26 mmol/L (ref 22–32)
TCO2: 26 mmol/L (ref 22–32)
TCO2: 25 mmol/L (ref 22–32)
TCO2: 25 mmol/L (ref 22–32)
TCO2: 25 mmol/L (ref 22–32)
TCO2: 24 mmol/L (ref 22–32)
pCO2 arterial: 51.9 mmHg — ABNORMAL HIGH (ref 32.0–48.0)
pCO2 arterial: 60.1 mmHg — ABNORMAL HIGH (ref 32.0–48.0)
pCO2 arterial: 53.4 mmHg — ABNORMAL HIGH (ref 32.0–48.0)
pCO2 arterial: 50.6 mmHg — ABNORMAL HIGH (ref 32.0–48.0)
pCO2 arterial: 44.3 mmHg (ref 32.0–48.0)
pCO2 arterial: 45.9 mmHg (ref 32.0–48.0)
pCO2 arterial: 44.7 mmHg (ref 32.0–48.0)
pCO2 arterial: 45.8 mmHg (ref 32.0–48.0)
pH, Arterial: 7.274 — ABNORMAL LOW (ref 7.350–7.450)
pH, Arterial: 7.22 — ABNORMAL LOW (ref 7.350–7.450)
pH, Arterial: 7.266 — ABNORMAL LOW (ref 7.350–7.450)
pH, Arterial: 7.295 — ABNORMAL LOW (ref 7.350–7.450)
pH, Arterial: 7.34 — ABNORMAL LOW (ref 7.350–7.450)
pH, Arterial: 7.316 — ABNORMAL LOW (ref 7.350–7.450)
pH, Arterial: 7.327 — ABNORMAL LOW (ref 7.350–7.450)
pH, Arterial: 7.303 — ABNORMAL LOW (ref 7.350–7.450)
pO2, Arterial: 49 mmHg — ABNORMAL LOW (ref 83.0–108.0)
pO2, Arterial: 163 mmHg — ABNORMAL HIGH (ref 83.0–108.0)
pO2, Arterial: 169 mmHg — ABNORMAL HIGH (ref 83.0–108.0)
pO2, Arterial: 192 mmHg — ABNORMAL HIGH (ref 83.0–108.0)
pO2, Arterial: 159 mmHg — ABNORMAL HIGH (ref 83.0–108.0)
pO2, Arterial: 61 mmHg — ABNORMAL LOW (ref 83.0–108.0)
pO2, Arterial: 62 mmHg — ABNORMAL LOW (ref 83.0–108.0)
pO2, Arterial: 70 mmHg — ABNORMAL LOW (ref 83.0–108.0)

## 2019-10-26 LAB — PROTIME-INR
INR: 1.4 — ABNORMAL HIGH (ref 0.8–1.2)
Prothrombin Time: 16.6 seconds — ABNORMAL HIGH (ref 11.4–15.2)

## 2019-10-26 LAB — GLUCOSE, CAPILLARY
Glucose-Capillary: 211 mg/dL — ABNORMAL HIGH (ref 70–99)
Glucose-Capillary: 194 mg/dL — ABNORMAL HIGH (ref 70–99)
Glucose-Capillary: 184 mg/dL — ABNORMAL HIGH (ref 70–99)
Glucose-Capillary: 175 mg/dL — ABNORMAL HIGH (ref 70–99)
Glucose-Capillary: 172 mg/dL — ABNORMAL HIGH (ref 70–99)
Glucose-Capillary: 196 mg/dL — ABNORMAL HIGH (ref 70–99)
Glucose-Capillary: 197 mg/dL — ABNORMAL HIGH (ref 70–99)
Glucose-Capillary: 185 mg/dL — ABNORMAL HIGH (ref 70–99)
Glucose-Capillary: 173 mg/dL — ABNORMAL HIGH (ref 70–99)

## 2019-10-26 LAB — HEPARIN LEVEL (UNFRACTIONATED): Heparin Unfractionated: 0.34 IU/mL (ref 0.30–0.70)

## 2019-10-26 LAB — HEMOGLOBIN AND HEMATOCRIT, BLOOD
HCT: 26.7 % — ABNORMAL LOW (ref 39.0–52.0)
Hemoglobin: 9.3 g/dL — ABNORMAL LOW (ref 13.0–17.0)

## 2019-10-26 LAB — MAGNESIUM: Magnesium: 2.9 mg/dL — ABNORMAL HIGH (ref 1.7–2.4)

## 2019-10-26 LAB — APTT: aPTT: 40 seconds — ABNORMAL HIGH (ref 24–36)

## 2019-10-26 LAB — PREPARE RBC (CROSSMATCH)

## 2019-10-26 LAB — PLATELET COUNT: Platelets: 107 10*3/uL — ABNORMAL LOW (ref 150–400)

## 2019-10-26 SURGERY — CORONARY ARTERY BYPASS GRAFTING (CABG)
Anesthesia: General | Site: Chest

## 2019-10-26 MED ORDER — DEXTROSE 50 % IV SOLN: 0.0000 mL | INTRAVENOUS | Status: DC | PRN

## 2019-10-26 MED ORDER — FAMOTIDINE IN NACL 20-0.9 MG/50ML-% IV SOLN
20.0000 mg | Freq: Two times a day (BID) | INTRAVENOUS | Status: AC
  Administered 2019-10-26 (×2): 20 mg via INTRAVENOUS
  Filled 2019-10-26: qty 50

## 2019-10-26 MED ORDER — MIDAZOLAM HCL (PF) 10 MG/2ML IJ SOLN
INTRAMUSCULAR | Status: AC
  Filled 2019-10-26: qty 2

## 2019-10-26 MED ORDER — 0.9 % SODIUM CHLORIDE (POUR BTL) OPTIME
TOPICAL | Status: DC | PRN
  Administered 2019-10-26: 6000 mL

## 2019-10-26 MED ORDER — PHENYLEPHRINE HCL-NACL 20-0.9 MG/250ML-% IV SOLN
0.0000 ug/min | INTRAVENOUS | Status: DC
  Administered 2019-10-26: 30 ug/min via INTRAVENOUS
  Filled 2019-10-26: qty 250

## 2019-10-26 MED ORDER — BISACODYL 5 MG PO TBEC
10.0000 mg | DELAYED_RELEASE_TABLET | Freq: Every day | ORAL | Status: DC
  Administered 2019-10-30 – 2019-11-06 (×4): 10 mg via ORAL
  Filled 2019-10-26 (×5): qty 2

## 2019-10-26 MED ORDER — PROPOFOL 10 MG/ML IV BOLUS
INTRAVENOUS | Status: DC | PRN
  Administered 2019-10-26: 50 mg via INTRAVENOUS

## 2019-10-26 MED ORDER — SODIUM CHLORIDE 0.9% FLUSH: 3.0000 mL | Freq: Two times a day (BID) | INTRAVENOUS | Status: DC

## 2019-10-26 MED ORDER — VANCOMYCIN HCL IN DEXTROSE 1-5 GM/200ML-% IV SOLN
1000.0000 mg | Freq: Once | INTRAVENOUS | Status: AC
  Administered 2019-10-26: 1000 mg via INTRAVENOUS
  Filled 2019-10-26: qty 200

## 2019-10-26 MED ORDER — MAGNESIUM SULFATE 4 GM/100ML IV SOLN
4.0000 g | Freq: Once | INTRAVENOUS | Status: AC
  Administered 2019-10-26: 4 g via INTRAVENOUS

## 2019-10-26 MED ORDER — SODIUM CHLORIDE 0.9 % IV SOLN: INTRAVENOUS | Status: DC | PRN

## 2019-10-26 MED ORDER — PROTAMINE SULFATE 10 MG/ML IV SOLN
INTRAVENOUS | Status: AC
  Filled 2019-10-26: qty 50

## 2019-10-26 MED ORDER — MILRINONE LACTATE IN DEXTROSE 20-5 MG/100ML-% IV SOLN
0.1250 ug/kg/min | INTRAVENOUS | Status: DC
  Administered 2019-10-27 (×3): 0.3 ug/kg/min via INTRAVENOUS
  Administered 2019-10-28 – 2019-10-30 (×2): 0.125 ug/kg/min via INTRAVENOUS
  Filled 2019-10-26 (×5): qty 100

## 2019-10-26 MED ORDER — BISACODYL 10 MG RE SUPP
10.0000 mg | Freq: Every day | RECTAL | Status: DC
  Administered 2019-10-27 – 2019-10-29 (×3): 10 mg via RECTAL
  Filled 2019-10-26 (×3): qty 1

## 2019-10-26 MED ORDER — MILRINONE LACTATE IN DEXTROSE 20-5 MG/100ML-% IV SOLN: 0.2000 ug/kg/min | INTRAVENOUS | Status: DC

## 2019-10-26 MED ORDER — METOPROLOL TARTRATE 5 MG/5ML IV SOLN
2.5000 mg | INTRAVENOUS | Status: DC | PRN
  Administered 2019-10-27: 2.5 mg via INTRAVENOUS
  Administered 2019-10-28 – 2019-10-29 (×6): 5 mg via INTRAVENOUS
  Filled 2019-10-26 (×7): qty 5

## 2019-10-26 MED ORDER — SODIUM CHLORIDE 0.45 % IV SOLN: INTRAVENOUS | Status: DC | PRN

## 2019-10-26 MED ORDER — PROPOFOL 10 MG/ML IV BOLUS
INTRAVENOUS | Status: AC
  Filled 2019-10-26: qty 20

## 2019-10-26 MED ORDER — METOPROLOL TARTRATE 12.5 MG HALF TABLET
12.5000 mg | ORAL_TABLET | Freq: Two times a day (BID) | ORAL | Status: DC
  Administered 2019-10-27 – 2019-10-30 (×3): 12.5 mg via ORAL
  Filled 2019-10-26 (×3): qty 1

## 2019-10-26 MED ORDER — MIDAZOLAM HCL 5 MG/5ML IJ SOLN
INTRAMUSCULAR | Status: DC | PRN
  Administered 2019-10-26: 3 mg via INTRAVENOUS
  Administered 2019-10-26: 2 mg via INTRAVENOUS
  Administered 2019-10-26: 3 mg via INTRAVENOUS
  Administered 2019-10-26: 2 mg via INTRAVENOUS

## 2019-10-26 MED ORDER — LEVALBUTEROL TARTRATE 45 MCG/ACT IN AERO
2.0000 | INHALATION_SPRAY | Freq: Four times a day (QID) | RESPIRATORY_TRACT | Status: DC
  Filled 2019-10-26: qty 15

## 2019-10-26 MED ORDER — PHENYLEPHRINE HCL (PRESSORS) 10 MG/ML IV SOLN
INTRAVENOUS | Status: DC | PRN
  Administered 2019-10-26: 40 ug via INTRAVENOUS

## 2019-10-26 MED ORDER — DOCUSATE SODIUM 100 MG PO CAPS
200.0000 mg | ORAL_CAPSULE | Freq: Every day | ORAL | Status: DC
  Administered 2019-10-30 – 2019-11-06 (×6): 200 mg via ORAL
  Filled 2019-10-26 (×6): qty 2

## 2019-10-26 MED ORDER — NITROGLYCERIN IN D5W 200-5 MCG/ML-% IV SOLN
0.0000 ug/min | INTRAVENOUS | Status: DC
  Administered 2019-10-29: 5 ug/min via INTRAVENOUS
  Filled 2019-10-26: qty 250

## 2019-10-26 MED ORDER — LACTATED RINGERS IV SOLN: INTRAVENOUS | Status: DC | PRN

## 2019-10-26 MED ORDER — VANCOMYCIN HCL 1000 MG IV SOLR
INTRAVENOUS | Status: DC | PRN
  Administered 2019-10-26: 3000 mg via TOPICAL

## 2019-10-26 MED ORDER — METOCLOPRAMIDE HCL 5 MG/ML IJ SOLN
10.0000 mg | Freq: Four times a day (QID) | INTRAMUSCULAR | Status: AC
  Administered 2019-10-26 – 2019-10-27 (×4): 10 mg via INTRAVENOUS
  Filled 2019-10-26 (×4): qty 2

## 2019-10-26 MED ORDER — AMIODARONE HCL IN DEXTROSE 360-4.14 MG/200ML-% IV SOLN
60.0000 mg/h | INTRAVENOUS | Status: DC
  Filled 2019-10-26: qty 200

## 2019-10-26 MED ORDER — VANCOMYCIN HCL 1000 MG IV SOLR
INTRAVENOUS | Status: AC
  Filled 2019-10-26: qty 3000

## 2019-10-26 MED ORDER — SODIUM CHLORIDE (PF) 0.9 % IJ SOLN
OROMUCOSAL | Status: DC | PRN
  Administered 2019-10-26: 4 mL via TOPICAL

## 2019-10-26 MED ORDER — HEPARIN SODIUM (PORCINE) 1000 UNIT/ML IJ SOLN
INTRAMUSCULAR | Status: DC | PRN
  Administered 2019-10-26: 31000 [IU] via INTRAVENOUS
  Administered 2019-10-26: 5000 [IU] via INTRAVENOUS

## 2019-10-26 MED ORDER — BUPIVACAINE HCL (PF) 0.5 % IJ SOLN
INTRAMUSCULAR | Status: AC
  Filled 2019-10-26: qty 30

## 2019-10-26 MED ORDER — ROCURONIUM BROMIDE 10 MG/ML (PF) SYRINGE
PREFILLED_SYRINGE | INTRAVENOUS | Status: AC
  Filled 2019-10-26: qty 20

## 2019-10-26 MED ORDER — SUCCINYLCHOLINE CHLORIDE 20 MG/ML IJ SOLN
INTRAMUSCULAR | Status: DC | PRN
  Administered 2019-10-26: 100 mg via INTRAVENOUS

## 2019-10-26 MED ORDER — SODIUM CHLORIDE 0.9% IV SOLUTION: Freq: Once | INTRAVENOUS | Status: DC

## 2019-10-26 MED ORDER — MIDAZOLAM HCL 2 MG/2ML IJ SOLN: 2.0000 mg | INTRAMUSCULAR | Status: DC | PRN

## 2019-10-26 MED ORDER — MICROFIBRILLAR COLL HEMOSTAT EX POWD
CUTANEOUS | Status: DC | PRN
  Administered 2019-10-26: 3 g via TOPICAL

## 2019-10-26 MED ORDER — SODIUM CHLORIDE 0.9 % IV SOLN: 250.0000 mL | INTRAVENOUS | Status: DC

## 2019-10-26 MED ORDER — METOPROLOL TARTRATE 25 MG/10 ML ORAL SUSPENSION
12.5000 mg | Freq: Two times a day (BID) | ORAL | Status: DC
  Administered 2019-10-27 – 2019-10-28 (×2): 12.5 mg
  Filled 2019-10-26 (×3): qty 5

## 2019-10-26 MED ORDER — AMIODARONE IV BOLUS ONLY 150 MG/100ML
INTRAVENOUS | Status: DC | PRN
  Administered 2019-10-26: 150 mg via INTRAVENOUS

## 2019-10-26 MED ORDER — FENTANYL CITRATE (PF) 250 MCG/5ML IJ SOLN
INTRAMUSCULAR | Status: DC | PRN
  Administered 2019-10-26: 100 ug via INTRAVENOUS
  Administered 2019-10-26: 150 ug via INTRAVENOUS
  Administered 2019-10-26 (×3): 100 ug via INTRAVENOUS
  Administered 2019-10-26: 450 ug via INTRAVENOUS
  Administered 2019-10-26 (×2): 100 ug via INTRAVENOUS
  Administered 2019-10-26: 50 ug via INTRAVENOUS

## 2019-10-26 MED ORDER — EPINEPHRINE PF 1 MG/ML IJ SOLN
0.0000 ug/min | INTRAVENOUS | Status: DC
  Filled 2019-10-26 (×2): qty 4

## 2019-10-26 MED ORDER — ALBUTEROL SULFATE HFA 108 (90 BASE) MCG/ACT IN AERS
INHALATION_SPRAY | RESPIRATORY_TRACT | Status: DC | PRN
  Administered 2019-10-26 (×2): 4 via RESPIRATORY_TRACT

## 2019-10-26 MED ORDER — DEXMEDETOMIDINE HCL IN NACL 400 MCG/100ML IV SOLN
0.0000 ug/kg/h | INTRAVENOUS | Status: DC
  Administered 2019-10-26: 0.3 ug/kg/h via INTRAVENOUS
  Filled 2019-10-26: qty 100

## 2019-10-26 MED ORDER — ASPIRIN EC 325 MG PO TBEC: 325.0000 mg | DELAYED_RELEASE_TABLET | Freq: Every day | ORAL | Status: DC

## 2019-10-26 MED ORDER — SODIUM CHLORIDE 0.9% FLUSH: 3.0000 mL | INTRAVENOUS | Status: DC | PRN

## 2019-10-26 MED ORDER — ACETAMINOPHEN 650 MG RE SUPP
650.0000 mg | Freq: Once | RECTAL | Status: AC
  Administered 2019-10-26: 15:00:00 650 mg via RECTAL

## 2019-10-26 MED ORDER — ACETAMINOPHEN 500 MG PO TABS
1000.0000 mg | ORAL_TABLET | Freq: Four times a day (QID) | ORAL | Status: AC
  Administered 2019-10-27 – 2019-10-31 (×7): 1000 mg via ORAL
  Filled 2019-10-26 (×7): qty 2

## 2019-10-26 MED ORDER — LACTATED RINGERS IV SOLN: 500.0000 mL | Freq: Once | INTRAVENOUS | Status: DC | PRN

## 2019-10-26 MED ORDER — NON FORMULARY
Status: DC | PRN
  Administered 2019-10-26: 30 mL via INTRAMUSCULAR

## 2019-10-26 MED ORDER — ROCURONIUM BROMIDE 100 MG/10ML IV SOLN
INTRAVENOUS | Status: DC | PRN
  Administered 2019-10-26: 100 mg via INTRAVENOUS
  Administered 2019-10-26 (×2): 50 mg via INTRAVENOUS

## 2019-10-26 MED ORDER — AMIODARONE HCL IN DEXTROSE 360-4.14 MG/200ML-% IV SOLN
60.0000 mg/h | INTRAVENOUS | Status: AC
  Administered 2019-10-26 (×2): 60 mg/h via INTRAVENOUS
  Filled 2019-10-26: qty 200

## 2019-10-26 MED ORDER — LACTATED RINGERS IV SOLN: INTRAVENOUS | Status: DC

## 2019-10-26 MED ORDER — SODIUM CHLORIDE 0.9% FLUSH
10.0000 mL | Freq: Two times a day (BID) | INTRAVENOUS | Status: DC
  Administered 2019-10-27 (×2): 10 mL
  Administered 2019-10-28: 20 mL
  Administered 2019-10-28 – 2019-11-05 (×13): 10 mL

## 2019-10-26 MED ORDER — SODIUM CHLORIDE 0.9 % IV SOLN: INTRAVENOUS | Status: DC

## 2019-10-26 MED ORDER — CHLORHEXIDINE GLUCONATE CLOTH 2 % EX PADS
6.0000 | MEDICATED_PAD | Freq: Every day | CUTANEOUS | Status: DC
  Administered 2019-10-26 – 2019-11-06 (×11): 6 via TOPICAL

## 2019-10-26 MED ORDER — BUPIVACAINE LIPOSOME 1.3 % IJ SUSP
20.0000 mL | Freq: Once | INTRAMUSCULAR | Status: DC
  Filled 2019-10-26: qty 20

## 2019-10-26 MED ORDER — STERILE WATER FOR INJECTION IJ SOLN
INTRAMUSCULAR | Status: AC
  Filled 2019-10-26: qty 10

## 2019-10-26 MED ORDER — FENTANYL CITRATE (PF) 250 MCG/5ML IJ SOLN
INTRAMUSCULAR | Status: AC
  Filled 2019-10-26: qty 25

## 2019-10-26 MED ORDER — SUCCINYLCHOLINE CHLORIDE 200 MG/10ML IV SOSY
PREFILLED_SYRINGE | INTRAVENOUS | Status: AC
  Filled 2019-10-26: qty 10

## 2019-10-26 MED ORDER — PROTAMINE SULFATE 10 MG/ML IV SOLN
INTRAVENOUS | Status: DC | PRN
  Administered 2019-10-26: 310 mg via INTRAVENOUS

## 2019-10-26 MED ORDER — PHENYLEPHRINE HCL-NACL 10-0.9 MG/250ML-% IV SOLN
INTRAVENOUS | Status: DC | PRN
  Administered 2019-10-26: 30 ug/min via INTRAVENOUS

## 2019-10-26 MED ORDER — INSULIN REGULAR(HUMAN) IN NACL 100-0.9 UT/100ML-% IV SOLN
INTRAVENOUS | Status: DC
  Administered 2019-10-27: 7.5 [IU]/h via INTRAVENOUS
  Filled 2019-10-26: qty 100

## 2019-10-26 MED ORDER — ROCURONIUM BROMIDE 10 MG/ML (PF) SYRINGE
PREFILLED_SYRINGE | INTRAVENOUS | Status: AC
  Filled 2019-10-26: qty 10

## 2019-10-26 MED ORDER — AMIODARONE HCL IN DEXTROSE 360-4.14 MG/200ML-% IV SOLN
30.0000 mg/h | INTRAVENOUS | Status: DC
  Filled 2019-10-26: qty 200

## 2019-10-26 MED ORDER — TRAMADOL HCL 50 MG PO TABS
50.0000 mg | ORAL_TABLET | ORAL | Status: DC | PRN
  Administered 2019-11-02 (×2): 100 mg via ORAL
  Administered 2019-11-03: 50 mg via ORAL
  Filled 2019-10-26: qty 2
  Filled 2019-10-26: qty 1
  Filled 2019-10-26: qty 2

## 2019-10-26 MED ORDER — AMIODARONE LOAD VIA INFUSION
150.0000 mg | Freq: Once | INTRAVENOUS | Status: DC
  Filled 2019-10-26: qty 83.34

## 2019-10-26 MED ORDER — HEPARIN SODIUM (PORCINE) 1000 UNIT/ML IJ SOLN
INTRAMUSCULAR | Status: AC
  Filled 2019-10-26: qty 1

## 2019-10-26 MED ORDER — OXYCODONE HCL 5 MG PO TABS
5.0000 mg | ORAL_TABLET | ORAL | Status: DC | PRN
  Administered 2019-10-27 (×2): 10 mg via ORAL
  Filled 2019-10-26 (×3): qty 2

## 2019-10-26 MED ORDER — AMIODARONE HCL IN DEXTROSE 360-4.14 MG/200ML-% IV SOLN
30.0000 mg/h | INTRAVENOUS | Status: DC
  Administered 2019-10-26 – 2019-10-31 (×11): 30 mg/h via INTRAVENOUS
  Filled 2019-10-26 (×5): qty 200
  Filled 2019-10-26: qty 400
  Filled 2019-10-26 (×5): qty 200

## 2019-10-26 MED ORDER — ONDANSETRON HCL 4 MG/2ML IJ SOLN
4.0000 mg | Freq: Four times a day (QID) | INTRAMUSCULAR | Status: DC | PRN
  Administered 2019-10-27: 4 mg via INTRAVENOUS
  Filled 2019-10-26: qty 2

## 2019-10-26 MED ORDER — BUPIVACAINE LIPOSOME 1.3 % IJ SUSP
INTRAMUSCULAR | Status: DC | PRN
  Administered 2019-10-26: 50 mL

## 2019-10-26 MED ORDER — ACETAMINOPHEN 160 MG/5ML PO SOLN
1000.0000 mg | Freq: Four times a day (QID) | ORAL | Status: AC
  Administered 2019-10-27 – 2019-10-28 (×4): 1000 mg
  Filled 2019-10-26 (×4): qty 40.6

## 2019-10-26 MED ORDER — ALBUMIN HUMAN 5 % IV SOLN
250.0000 mL | INTRAVENOUS | Status: AC | PRN
  Administered 2019-10-26 (×3): 12.5 g via INTRAVENOUS
  Filled 2019-10-26: qty 250

## 2019-10-26 MED ORDER — LIDOCAINE 2% (20 MG/ML) 5 ML SYRINGE
INTRAMUSCULAR | Status: AC
  Filled 2019-10-26: qty 10

## 2019-10-26 MED ORDER — SODIUM CHLORIDE 0.9% FLUSH: 10.0000 mL | INTRAVENOUS | Status: DC | PRN

## 2019-10-26 MED ORDER — ASPIRIN 81 MG PO CHEW
324.0000 mg | CHEWABLE_TABLET | Freq: Every day | ORAL | Status: DC
  Administered 2019-10-27: 324 mg
  Filled 2019-10-26: qty 4

## 2019-10-26 MED ORDER — LIDOCAINE HCL (CARDIAC) PF 100 MG/5ML IV SOSY
PREFILLED_SYRINGE | INTRAVENOUS | Status: DC | PRN
  Administered 2019-10-26: 100 mg via INTRAVENOUS

## 2019-10-26 MED ORDER — STERILE WATER FOR INJECTION IJ SOLN
INTRAMUSCULAR | Status: DC | PRN
  Administered 2019-10-26 (×2): 10 mL

## 2019-10-26 MED ORDER — EPINEPHRINE HCL 5 MG/250ML IV SOLN IN NS
0.5000 ug/min | INTRAVENOUS | Status: DC
  Administered 2019-10-26: 4 ug/min via INTRAVENOUS
  Filled 2019-10-26 (×2): qty 250

## 2019-10-26 MED ORDER — POTASSIUM CHLORIDE 10 MEQ/50ML IV SOLN: 10.0000 meq | INTRAVENOUS | Status: AC

## 2019-10-26 MED ORDER — ACETAMINOPHEN 160 MG/5ML PO SOLN: 650.0000 mg | Freq: Once | ORAL | Status: AC

## 2019-10-26 MED ORDER — CHLORHEXIDINE GLUCONATE 0.12 % MT SOLN
15.0000 mL | OROMUCOSAL | Status: AC
  Administered 2019-10-26: 15 mL via OROMUCOSAL
  Filled 2019-10-26: qty 15

## 2019-10-26 MED ORDER — MORPHINE SULFATE (PF) 2 MG/ML IV SOLN
1.0000 mg | INTRAVENOUS | Status: DC | PRN
  Administered 2019-10-27: 2 mg via INTRAVENOUS
  Filled 2019-10-26: qty 1

## 2019-10-26 MED ORDER — SODIUM CHLORIDE 0.9 % IV SOLN
1.5000 g | Freq: Two times a day (BID) | INTRAVENOUS | Status: AC
  Administered 2019-10-26 – 2019-10-28 (×4): 1.5 g via INTRAVENOUS
  Filled 2019-10-26 (×4): qty 1.5

## 2019-10-26 MED ORDER — PANTOPRAZOLE SODIUM 40 MG PO TBEC: 40.0000 mg | DELAYED_RELEASE_TABLET | Freq: Every day | ORAL | Status: DC

## 2019-10-26 MED ORDER — PLASMA-LYTE 148 IV SOLN
INTRAVENOUS | Status: DC | PRN
  Administered 2019-10-26: 500 mL

## 2019-10-26 SURGICAL SUPPLY — 131 items
ADAPTER CARDIO PERF ANTE/RETRO (ADAPTER) ×4 IMPLANT
APPLICATOR TIP COSEAL (VASCULAR PRODUCTS) ×1 IMPLANT
APPLIER CLIP 9.375 SM OPEN (CLIP) ×4
BAG DECANTER FOR FLEXI CONT (MISCELLANEOUS) ×4 IMPLANT
BASKET HEART (ORDER IN 25'S) (MISCELLANEOUS) ×4
BASKET HEART (ORDER IN 25S) (MISCELLANEOUS) ×3 IMPLANT
BATTERY MAXDRIVER (MISCELLANEOUS) ×1 IMPLANT
BLADE CLIPPER SURG (BLADE) ×4 IMPLANT
BLADE STERNUM SYSTEM 6 (BLADE) ×4 IMPLANT
BLADE SURG 15 STRL LF DISP TIS (BLADE) ×3 IMPLANT
BLADE SURG 15 STRL SS (BLADE) ×4
BNDG ELASTIC 4X5.8 VLCR STR LF (GAUZE/BANDAGES/DRESSINGS) ×5 IMPLANT
BNDG ELASTIC 6X15 VLCR STRL LF (GAUZE/BANDAGES/DRESSINGS) ×1 IMPLANT
BNDG ELASTIC 6X5.8 VLCR STR LF (GAUZE/BANDAGES/DRESSINGS) ×4 IMPLANT
BNDG GAUZE ELAST 4 BULKY (GAUZE/BANDAGES/DRESSINGS) ×5 IMPLANT
CANISTER SUCT 3000ML PPV (MISCELLANEOUS) ×4 IMPLANT
CANNULA NON VENT 20FR 12 (CANNULA) ×1 IMPLANT
CATH CPB KIT HENDRICKSON (MISCELLANEOUS) ×4 IMPLANT
CATH RETROPLEGIA CORONARY 14FR (CATHETERS) ×1 IMPLANT
CATH ROBINSON RED A/P 18FR (CATHETERS) ×9 IMPLANT
CLIP APPLIE 9.375 SM OPEN (CLIP) ×3 IMPLANT
CLIP RETRACTION 3.0MM CORONARY (MISCELLANEOUS) ×5 IMPLANT
CLIP VESOCCLUDE MED 24/CT (CLIP) IMPLANT
CLIP VESOCCLUDE SM WIDE 24/CT (CLIP) ×1 IMPLANT
CONN ST 1/4X3/8  BEN (MISCELLANEOUS) ×6
CONN ST 1/4X3/8 BEN (MISCELLANEOUS) IMPLANT
COVER MAYO STAND STRL (DRAPES) ×4 IMPLANT
CUFF TOURN SGL QUICK 18X4 (TOURNIQUET CUFF) IMPLANT
CUFF TOURN SGL QUICK 24 (TOURNIQUET CUFF)
CUFF TRNQT CYL 24X4X16.5-23 (TOURNIQUET CUFF) IMPLANT
DERMABOND ADHESIVE PROPEN (GAUZE/BANDAGES/DRESSINGS) ×2
DERMABOND ADVANCED (GAUZE/BANDAGES/DRESSINGS) ×6
DERMABOND ADVANCED .7 DNX12 (GAUZE/BANDAGES/DRESSINGS) ×3 IMPLANT
DERMABOND ADVANCED .7 DNX6 (GAUZE/BANDAGES/DRESSINGS) IMPLANT
DRAIN CHANNEL 28F RND 3/8 FF (WOUND CARE) ×12 IMPLANT
DRAPE CARDIOVASCULAR INCISE (DRAPES) ×3
DRAPE EXTREMITY T 121X128X90 (DISPOSABLE) ×4 IMPLANT
DRAPE HALF SHEET 40X57 (DRAPES) ×4 IMPLANT
DRAPE SLUSH/WARMER DISC (DRAPES) ×4 IMPLANT
DRAPE SRG 135X102X78XABS (DRAPES) ×3 IMPLANT
DRSG AQUACEL AG ADV 3.5X14 (GAUZE/BANDAGES/DRESSINGS) ×4 IMPLANT
ELECT CAUTERY BLADE 6.4 (BLADE) ×4 IMPLANT
ELECT REM PT RETURN 9FT ADLT (ELECTROSURGICAL) ×8
ELECTRODE REM PT RTRN 9FT ADLT (ELECTROSURGICAL) ×6 IMPLANT
FELT TEFLON 1X6 (MISCELLANEOUS) ×8 IMPLANT
GAUZE SPONGE 4X4 12PLY STRL (GAUZE/BANDAGES/DRESSINGS) ×7 IMPLANT
GAUZE SPONGE 4X4 12PLY STRL LF (GAUZE/BANDAGES/DRESSINGS) ×2 IMPLANT
GEL ULTRASOUND 20GR AQUASONIC (MISCELLANEOUS) ×4 IMPLANT
GLOVE BIO SURGEON STRL SZ 6.5 (GLOVE) ×4 IMPLANT
GLOVE BIOGEL PI IND STRL 6 (GLOVE) IMPLANT
GLOVE BIOGEL PI IND STRL 8 (GLOVE) IMPLANT
GLOVE BIOGEL PI INDICATOR 6 (GLOVE) ×4
GLOVE BIOGEL PI INDICATOR 8 (GLOVE) ×3
GLOVE NEODERM STRL 7.5  LF PF (GLOVE) ×6
GLOVE NEODERM STRL 7.5 LF PF (GLOVE) ×9 IMPLANT
GLOVE SURG NEODERM 7.5  LF PF (GLOVE) ×3
GOWN STRL REUS W/ TWL LRG LVL3 (GOWN DISPOSABLE) ×12 IMPLANT
GOWN STRL REUS W/TWL LRG LVL3 (GOWN DISPOSABLE) ×32
HEMOSTAT POWDER SURGIFOAM 1G (HEMOSTASIS) ×8 IMPLANT
INSERT FOGARTY XLG (MISCELLANEOUS) ×1 IMPLANT
KIT BASIN OR (CUSTOM PROCEDURE TRAY) ×4 IMPLANT
KIT SUCTION CATH 14FR (SUCTIONS) ×4 IMPLANT
KIT TURNOVER KIT B (KITS) ×4 IMPLANT
KIT VASOVIEW HEMOPRO 2 VH 4000 (KITS) ×4 IMPLANT
MARKER GRAFT CORONARY BYPASS (MISCELLANEOUS) ×13 IMPLANT
NDL 18GX1X1/2 (RX/OR ONLY) (NEEDLE) ×3 IMPLANT
NEEDLE 18GX1X1/2 (RX/OR ONLY) (NEEDLE) ×8 IMPLANT
NS IRRIG 1000ML POUR BTL (IV SOLUTION) ×21 IMPLANT
PACK E OPEN HEART (SUTURE) ×4 IMPLANT
PACK OPEN HEART (CUSTOM PROCEDURE TRAY) ×4 IMPLANT
PACK SPY-PHI (KITS) ×1 IMPLANT
PAD ARMBOARD 7.5X6 YLW CONV (MISCELLANEOUS) ×8 IMPLANT
PAD ELECT DEFIB RADIOL ZOLL (MISCELLANEOUS) ×4 IMPLANT
PENCIL BUTTON HOLSTER BLD 10FT (ELECTRODE) ×5 IMPLANT
PLATE STERNAL 2.3X208 14H 2-PK (Plate) ×1 IMPLANT
POSITIONER HEAD DONUT 9IN (MISCELLANEOUS) ×4 IMPLANT
POWDER SURGICEL 3.0 GRAM (HEMOSTASIS) ×1 IMPLANT
PUNCH AORTIC ROTATE 4.5MM 8IN (MISCELLANEOUS) ×1 IMPLANT
SCREW LOCKING TI 2.3X11MM (Screw) ×4 IMPLANT
SCREW LOCKING TI 2.3X13MM (Screw) ×4 IMPLANT
SCREW STERNAL LOCK 2.3MM (Screw) ×2 IMPLANT
SEALANT SURG COSEAL 4ML (VASCULAR PRODUCTS) ×1 IMPLANT
SEALANT SURG COSEAL 8ML (VASCULAR PRODUCTS) IMPLANT
SET CARDIOPLEGIA MPS 5001102 (MISCELLANEOUS) ×1 IMPLANT
SHEARS HARMONIC 9CM CVD (BLADE) ×2 IMPLANT
SHEARS HARMONIC STRL 23CM (MISCELLANEOUS) ×1 IMPLANT
SOL ANTI FOG 6CC (MISCELLANEOUS) IMPLANT
SOLUTION ANTI FOG 6CC (MISCELLANEOUS) ×1
SPONGE LAP 18X18 RF (DISPOSABLE) ×2 IMPLANT
SUT BONE WAX W31G (SUTURE) ×4 IMPLANT
SUT MNCRL AB 3-0 PS2 18 (SUTURE) ×8 IMPLANT
SUT MNCRL AB 4-0 PS2 18 (SUTURE) ×2 IMPLANT
SUT PDS AB 1 CTX 36 (SUTURE) ×8 IMPLANT
SUT PROLENE 3 0 SH DA (SUTURE) ×5 IMPLANT
SUT PROLENE 4 0 RB 1 (SUTURE) ×3
SUT PROLENE 4-0 RB1 .5 CRCL 36 (SUTURE) IMPLANT
SUT PROLENE 5 0 C 1 36 (SUTURE) IMPLANT
SUT PROLENE 6 0 C 1 30 (SUTURE) ×14 IMPLANT
SUT PROLENE 7 0 BV 1 (SUTURE) ×2 IMPLANT
SUT PROLENE 8 0 BV175 6 (SUTURE) ×1 IMPLANT
SUT PROLENE BLUE 7 0 (SUTURE) ×4 IMPLANT
SUT SILK  1 MH (SUTURE) ×4
SUT SILK 1 MH (SUTURE) IMPLANT
SUT SILK 2 0 SH CR/8 (SUTURE) IMPLANT
SUT SILK 2 0 TIES 10X30 (SUTURE) ×1 IMPLANT
SUT SILK 3 0 SH CR/8 (SUTURE) IMPLANT
SUT STEEL 6MS V (SUTURE) ×4 IMPLANT
SUT STEEL SZ 6 DBL 3X14 BALL (SUTURE) ×4 IMPLANT
SUT VIC AB 2-0 CT1 27 (SUTURE) ×8
SUT VIC AB 2-0 CT1 TAPERPNT 27 (SUTURE) IMPLANT
SUT VIC AB 2-0 CTX 27 (SUTURE) IMPLANT
SUT VIC AB 3-0 SH 27 (SUTURE)
SUT VIC AB 3-0 SH 27X BRD (SUTURE) IMPLANT
SUT VIC AB 3-0 X1 27 (SUTURE) IMPLANT
SYR 10ML LL (SYRINGE) ×1 IMPLANT
SYR 20ML LL LF (SYRINGE) ×1 IMPLANT
SYR 30ML LL (SYRINGE) ×5 IMPLANT
SYR 3ML LL SCALE MARK (SYRINGE) ×5 IMPLANT
SYR 50ML SLIP (SYRINGE) IMPLANT
SYSTEM SAHARA CHEST DRAIN ATS (WOUND CARE) ×4 IMPLANT
TAPE CLOTH SURG 4X10 WHT LF (GAUZE/BANDAGES/DRESSINGS) ×1 IMPLANT
TAPE PAPER 2X10 WHT MICROPORE (GAUZE/BANDAGES/DRESSINGS) ×1 IMPLANT
TOWEL GREEN STERILE (TOWEL DISPOSABLE) ×4 IMPLANT
TOWEL GREEN STERILE FF (TOWEL DISPOSABLE) ×4 IMPLANT
TRAY FOLEY SLVR 16FR TEMP STAT (SET/KITS/TRAYS/PACK) ×4 IMPLANT
TUBE SUCTION CARDIAC 10FR (CANNULA) ×1 IMPLANT
TUBING ART PRESS 48 MALE/FEM (TUBING) ×2 IMPLANT
TUBING LAP HI FLOW INSUFFLATIO (TUBING) ×5 IMPLANT
UNDERPAD 30X30 (UNDERPADS AND DIAPERS) ×4 IMPLANT
WATER STERILE IRR 1000ML POUR (IV SOLUTION) ×8 IMPLANT
WATER STERILE IRR 1000ML UROMA (IV SOLUTION) IMPLANT

## 2019-10-26 NOTE — H&P (Signed)
History and Physical Interval Note:  10/26/2019 7:26 AM  Larry Terrell  has presented today for surgery, with the diagnosis of CAD.  The various methods of treatment have been discussed with the patient and family. After consideration of risks, benefits and other options for treatment, the patient has consented to  Procedure(s) with comments: CORONARY ARTERY BYPASS GRAFTING (CABG) (N/A) - BILATERAL IMA RADIAL ARTERY HARVEST (Left) TRANSESOPHAGEAL ECHOCARDIOGRAM (TEE) (N/A) INDOCYANINE GREEN FLUORESCENCE IMAGING (ICG) (N/A) as a surgical intervention.  The patient's history has been reviewed, patient examined, no change in status, stable for surgery.  I have reviewed the patient's chart and labs.  Questions were answered to the patient's satisfaction.     Wonda Olds

## 2019-10-26 NOTE — Op Note (Signed)
CARDIOTHORACIC SURGERY OPERATIVE NOTE  Date of Procedure: 10/26/2019  Preoperative Diagnosis: Severe 3-vessel Coronary Artery Disease  Postoperative Diagnosis: Same  Procedure:    Coronary Artery Bypass Grafting x5   Left Internal Mammary Artery to Distal Left Anterior Descending Coronary Artery; pedicled RIMA Graft to  Posterior Descending Coronary Artery; Reversed saphenous Vein Graft to distal Obtuse Marginal Branch of Left Circumflex Coronary Artery, 1st OM Branch of Left Circumflex Coronary Artery and 1st Diagonal Branch Coronary Artery as sequenced graft; Endoscopic Vein Harvest from right Thigh ; open left radial artery harvesting (graft not used due to poor quality);  Multilevel rib block with Exparel solution  Rigid sternal reconstruction with linear plating system  Completion graft surveillance with indocyanine green fluorescence imaging (SPY)   Surgeon: B. Murvin Natal, MD  Assistant: Ahmed Prima, E PA-C  Anesthesia: get  Operative Findings:  preserved left ventricular systolic function; LV hypertrophy  good quality  internal mammary artery conduits  Good quality saphenous vein conduit  Poor quality radial artery conduit--not utilized  Good quality target vessels for grafting    BRIEF CLINICAL NOTE AND INDICATIONS FOR SURGERY  59 yo man with longstanding h/o DM, PVD, and CAD s/p PCI presented with atypical chest pain which was suspicious for angina in [place] of his prior hx. He was taken to cath lab, where severe multivessel CAD was identified. Referred for surgery as definitive therapy. He has undergone extensive work-up and is considered a good candidate for surgery.    DETAILS OF THE OPERATIVE PROCEDURE  Preparation:  The patient is brought to the operating room on the above mentioned date and central monitoring was established by the anesthesia team including placement of Swan-Ganz catheter and radial arterial line. The patient is placed in the supine  position on the operating table.  Intravenous antibiotics are administered. General endotracheal anesthesia is induced uneventfully. A Foley catheter is placed.  Baseline transesophageal echocardiogram was performed.  Findings were notable for LV hypertrophy and preserved LV systolic function  The patient's chest, abdomen, both groins, and both lower extremities are prepared and draped in a sterile manner. A time out procedure is performed.   Surgical Approach and Conduit Harvest:  A median sternotomy incision was performed and the left internal mammary artery is dissected from the chest wall and prepared for bypass grafting. The left internal mammary artery is notably good quality conduit. Simultaneously, the left radial artery is harvested using an open incisional technique. This graft is ultimately found unusable due to small caliber, diffuse disease, and poor tissue quality that was concerning for adverse outcome. Therefore, the greater saphenous vein is obtained from the patient's right thigh using endoscopic vein harvest technique. The saphenous vein is notably good quality conduit. After removal of the saphenous vein, the small surgical incisions in the lower extremity are closed with absorbable suture. Finally, after mobilizing the left IMA, attention is turned to the right chest wall, where the RIMA is harvested. Following systemic heparinization, both internal mammary arteries were transected distally and noted to have excellent flow. They were treated with papaverine. The intercostal spaces on each side of the sternum were injected with exparel/bupivicaine solution to create a multilevel rib block.   Extracorporeal Cardiopulmonary Bypass and Myocardial Protection:  The pericardium is opened. The ascending aorta is nondiseased in appearance. The ascending aorta and the right atrium are cannulated for cardiopulmonary bypass.  Adequate heparinization is verified.   A retrograde cardioplegia  cannula is placed through the right atrium into the coronary sinus.  Cardiopulmonary bypass was begun and the surface of the heart is inspected. Distal target vessels are selected for coronary artery bypass grafting. A cardioplegia cannula is placed in the ascending aorta.   The patient is allowed to cool passively to 34C systemic temperature.  The aortic cross clamp is applied and cold blood cardioplegia is delivered initially in an antegrade fashion through the aortic root. Supplemental cardioplegia is given retrograde through the coronary sinus catheter.  Iced saline slush is applied for topical hypothermia.  The initial cardioplegic arrest is rapid with early diastolic arrest.  Repeat doses of cardioplegia are administered intermittently throughout the entire cross clamp portion of the operation through the aortic root, through the coronary sinus catheter, and through subsequently placed vein grafts in order to maintain completely flat electrocardiogram.   Coronary Artery Bypass Grafting:   The distal obtuse marginal branch of the left circumflex coronary artery was grafted using a reversed saphenous vein graft in an end-to-side fashion.  At the site of distal anastomosis the target vessel was good quality and measured approximately 1.5 mm in diameter. Next, the OM1 branch of the circumflex coronary artery was grafted in and side-to-side fashion using a sequential saphenous vein graft off of the distal segment of the vein graft placed to the distal OM.Marland Kitchen  At the site of distal anastomosis the target vessel was good quality and measured approximately 1.5 mm in diameter. Finally, the 1st diagonal branch of the left anterior descending coronary artery was grafted using this same piece of vein graft in an side-to-side fashion.  At the site of distal anastomosis the target vessel was good quality and measured approximately 1.5 mm in diameter. Anastomotic patency and runoff was confirmed with indocyanine  green fluorescence imaging (SPY).  The distal left anterior coronary artery was grafted with the left internal mammary artery in an end-to-side fashion.  At the site of distal anastomosis the target vessel was good quality and measured approximately 1.5 mm in diameter.Anastomotic patency and runoff was confirmed with indocyanine green fluorescence imaging (SPY).  The  posterior descending branch of the right coronary artery was grafted using a reversed saphenous vein graft in an end-to-side fashion.  At the site of distal anastomosis the target vessel was good quality and measured approximately 1.5 mm in diameter.Anastomotic patency and runoff was confirmed with indocyanine green fluorescence imaging (SPY).  All proximal vein graft anastomoses were placed directly to the ascending aorta prior to removal of the aortic cross clamp. Deairing procedures were performed and the aortic cross clamp was removed.    Procedure Completion:  All proximal and distal coronary anastomoses were inspected for hemostasis and appropriate graft orientation. Epicardial pacing wires are fixed to the right ventricular outflow tract and to the right atrial appendage. The patient is rewarmed to 37C temperature. The patient is weaned and disconnected from cardiopulmonary bypass.  The patient's rhythm at separation from bypass was sinus bradycardia.  The patient was weaned from cardiopulmonary bypass  without difficulty.   Followup transesophageal echocardiogram performed after separation from bypass revealed no changes from the preoperative exam.  The aortic and venous cannula were removed uneventfully. Protamine was administered to reverse the anticoagulation. The mediastinum and pleural space were inspected for hemostasis and irrigated with saline solution. The mediastinum and bilateral pleural spaces were drained using fluted chest tubes placed through separate stab incisions inferiorly.  The soft tissues anterior to the  aorta were reapproximated loosely. The sternum is closed in a rigid sternal fashion with linear plating system  and with double strength sternal wire. The soft tissues anterior to the sternum were closed in multiple layers and the skin is closed with a running subcuticular skin closure.  The post-bypass portion of the operation was notable for stable rhythm and hemodynamics.     Disposition:  The patient tolerated the procedure well and is transported to the surgical intensive care in stable condition. There are no intraoperative complications. All sponge instrument and needle counts are verified correct at completion of the operation.    Jayme Cloud, MD 10/26/2019 5:21 PM

## 2019-10-26 NOTE — Anesthesia Procedure Notes (Signed)
Central Venous Catheter Insertion Performed by: Roberts Gaudy, MD, anesthesiologist Start/End4/15/2021 6:50 AM, 10/26/2019 7:00 AM Patient location: Pre-op. Preanesthetic checklist: patient identified, IV checked, site marked, risks and benefits discussed, surgical consent, monitors and equipment checked, pre-op evaluation, timeout performed and anesthesia consent Lidocaine 1% used for infiltration and patient sedated Hand hygiene performed  and maximum sterile barriers used  Catheter size: 9 Fr Sheath introducer Procedure performed using ultrasound guided technique. Ultrasound Notes:anatomy identified, needle tip was noted to be adjacent to the nerve/plexus identified, no ultrasound evidence of intravascular and/or intraneural injection and image(s) printed for medical record Attempts: 1 Following insertion, line sutured and dressing applied. Post procedure assessment: blood return through all ports, free fluid flow and no air  Patient tolerated the procedure well with no immediate complications.

## 2019-10-26 NOTE — Anesthesia Procedure Notes (Signed)
Arterial Line Insertion Start/End4/15/2021 7:07 AM, 10/26/2019 7:14 AM Performed by: Suzette Battiest, MD, anesthesiologist  Patient location: Pre-op. Preanesthetic checklist: patient identified, IV checked, site marked, risks and benefits discussed, surgical consent, monitors and equipment checked, pre-op evaluation, timeout performed and anesthesia consent Lidocaine 1% used for infiltration Right, radial was placed Catheter size: 20 Fr Hand hygiene performed  and maximum sterile barriers used   Attempts: 1 Procedure performed using ultrasound guided technique. Ultrasound Notes:anatomy identified, needle tip was noted to be adjacent to the nerve/plexus identified, no ultrasound evidence of intravascular and/or intraneural injection and image(s) printed for medical record Following insertion, dressing applied and Biopatch. Post procedure assessment: normal and unchanged  Patient tolerated the procedure well with no immediate complications.

## 2019-10-26 NOTE — Progress Notes (Signed)
Echocardiogram Echocardiogram Transesophageal has been performed.  Darlina Sicilian M 10/26/2019, 8:35 AM

## 2019-10-26 NOTE — Brief Op Note (Signed)
10/26/2019  5:18 PM  PATIENT:  Larry Terrell  59 y.o. male  PRE-OPERATIVE DIAGNOSIS:  Coronary Artery Disease  POST-OPERATIVE DIAGNOSIS:  Coronary Artery Disease  PROCEDURE:  Procedure(s): CORONARY ARTERY BYPASS GRAFTING (CABG) x 5, Using Bilateral mammary arteries, and right leg greater saphenous vein harvested endoscopically (N/A) RADIAL ARTERY HARVEST (Left) TRANSESOPHAGEAL ECHOCARDIOGRAM (TEE) (N/A) INDOCYANINE GREEN FLUORESCENCE IMAGING (ICG) (N/A)  SURGEON:  Surgeon(s) and Role:    * Wonda Olds, MD - Primary  PHYSICIAN ASSISTANT: Barrett, E PA-C  ASSISTANTS: staff   ANESTHESIA:   general  [id number]  BLOOD ADMINISTERED: per anes  DRAINS: 3 Chest Tube(s) in the mediastinum and bilateral pleural spaces   LOCAL MEDICATIONS USED:  OTHER Exparel/bupivicaine solution  SPECIMEN:  No Specimen  DISPOSITION OF SPECIMEN:  N/A  COUNTS:  YES  TOURNIQUET:  * No tourniquets in log *  DICTATION: .Note written in EPIC  PLAN OF CARE: Admit to inpatient   PATIENT DISPOSITION:  ICU - intubated and critically ill.   Delay start of Pharmacological VTE agent (>24hrs) due to surgical blood loss or risk of bleeding: yes

## 2019-10-26 NOTE — Progress Notes (Signed)
      [address]       ,[place] [address]             [phone]      S/p CABG x 5  Intubated, sedated  BP 106/69 (BP Location: Right Arm)   Pulse 92   Temp (!) 97.5 F (36.4 C)   Resp 12   Ht 5' 7"  (1.702 m)   Wt 90.7 kg   SpO2 100%   BMI 31.32 kg/m  CI= 1.6 - 1.8 on epi @ 3, milrinone @ 0.2  IMV 16/100%/ 8 PEEP- respiratory acidosis, Co2 down from 60 to 53   Intake/Output Summary (Last 24 hours) at 10/26/2019 1728 Last data filed at 10/26/2019 1700 Gross per 24 hour  Intake 4908.23 ml  Output 1785 ml  Net 3123.23 ml   Hct= 35, K= 5.6  Early postop  Continue volume resuscitation, milrinone, epi   Larry Terrell C. Roxan Hockey, MD [facility] [phone]

## 2019-10-26 NOTE — Transfer of Care (Signed)
Immediate Anesthesia Transfer of Care Note  Patient: Larry Terrell  Procedure(s) Performed: CORONARY ARTERY BYPASS GRAFTING (CABG) x 5, Using Bilateral mammary arteries, and right leg greater saphenous vein harvested endoscopically (N/A Chest) RADIAL ARTERY HARVEST (Left Arm Lower) TRANSESOPHAGEAL ECHOCARDIOGRAM (TEE) (N/A ) INDOCYANINE GREEN FLUORESCENCE IMAGING (ICG) (N/A )  Patient Location: SICU  Anesthesia Type:General  Level of Consciousness: sedated and Patient remains intubated per anesthesia plan  Airway & Oxygen Therapy: Patient remains intubated per anesthesia plan and Patient placed on Ventilator (see vital sign flow sheet for setting)  Post-op Assessment: Report given to RN and Post -op Vital signs reviewed and stable  Post vital signs: Reviewed and stable  Last Vitals:  Vitals Value Taken Time  BP 106/69 10/26/19 1448  Temp 36.4 C 10/26/19 1454  Pulse 80 10/26/19 1454  Resp 23 10/26/19 1454  SpO2 88 % 10/26/19 1454  Vitals shown include unvalidated device data.  Last Pain:  Vitals:   10/26/19 0500  TempSrc:   PainSc: 0-No pain      Patients Stated Pain Goal: 3 (22/58/34 6219)  Complications: No apparent anesthesia complications

## 2019-10-26 NOTE — Anesthesia Procedure Notes (Signed)
Central Venous Catheter Insertion Performed by: Roberts Gaudy, MD, anesthesiologist Start/End4/15/2021 6:50 AM, 10/26/2019 7:00 AM Patient location: Pre-op. Preanesthetic checklist: patient identified, IV checked, site marked, risks and benefits discussed, surgical consent, monitors and equipment checked, pre-op evaluation, timeout performed and anesthesia consent Hand hygiene performed  and maximum sterile barriers used  PA cath was placed.Swan type:thermodilution Procedure performed without using ultrasound guided technique. Attempts: 1 Following insertion, line sutured, dressing applied and Biopatch. Post procedure assessment: blood return through all ports, free fluid flow and no air  Patient tolerated the procedure well with no immediate complications.

## 2019-10-26 NOTE — Anesthesia Procedure Notes (Signed)
Procedure Name: Intubation Date/Time: 10/26/2019 8:00 AM Performed by: Gaylene Brooks, CRNA Pre-anesthesia Checklist: Patient identified, Emergency Drugs available, Suction available and Patient being monitored Patient Re-evaluated:Patient Re-evaluated prior to induction Oxygen Delivery Method: Circle System Utilized Preoxygenation: Pre-oxygenation with 100% oxygen Induction Type: IV induction Ventilation: Oral airway inserted - appropriate to patient size and Two handed mask ventilation required Laryngoscope Size: Mac and 4 Grade View: Grade I Tube type: Oral Tube size: 8.0 mm Number of attempts: 1 Airway Equipment and Method: Stylet and Oral airway Placement Confirmation: ETT inserted through vocal cords under direct vision,  positive ETCO2 and breath sounds checked- equal and bilateral Secured at: 23 cm Tube secured with: Tape Dental Injury: Teeth and Oropharynx as per pre-operative assessment  Comments: Intubation by Quintella Baton, SRNA.  Small nick to lip with minimal bleeding.

## 2019-10-26 NOTE — Brief Op Note (Signed)
10/20/2019 - 10/26/2019  11:36 AM  PATIENT:  Larry Terrell  59 y.o. male  PRE-OPERATIVE DIAGNOSIS:  Coronary ARtery Disease  POST-OPERATIVE DIAGNOSIS:  Coronary ARtery Disease  PROCEDURE:  Procedure(s):  CORONARY ARTERY BYPASS GRAFTING x 5 -LIMA to LAD -RIMA to PDA -SEQUENTIAL DISTAL OM, OM1, and DIAGONAL  ENDOSCOPIC HARVEST GREATER SAPHENOUS VEIN -Left Thigh  OPEN RADIAL ARTERY HARVEST  -Radial not felt to be suitable conduit  TRANSESOPHAGEAL ECHOCARDIOGRAM (TEE) (N/A)  INDOCYANINE GREEN FLUORESCENCE IMAGING (ICG) (N/A)  SURGEON:  Surgeon(s) and Role:    * Wonda Olds, MD - Primary  PHYSICIAN ASSISTANT: Ellwood Handler PA-C  ANESTHESIA:   general  EBL:  Refer to Anesthesia Record   BLOOD ADMINISTERED:1 unit PRBC and  CELLSAVER  DRAINS: Left and Right Pleural Chest tube, mediastinal Chest drains   LOCAL MEDICATIONS USED:  BUPIVICAINE   SPECIMEN:  No Specimen  DISPOSITION OF SPECIMEN:  N/A  COUNTS:  YES  TOURNIQUET:  * No tourniquets in log *  DICTATION: .Dragon Dictation  PLAN OF CARE: Admit to inpatient   PATIENT DISPOSITION:  ICU - intubated and hemodynamically stable.   Delay start of Pharmacological VTE agent (>24hrs) due to surgical blood loss or risk of bleeding: yes

## 2019-10-26 NOTE — Progress Notes (Signed)
ABG PaO2 in the 50's. FIO2 increased to 100% and PEEP to 8 per MD Atkins. Will repeat ABG in 1 hour. Will continue to monitor.

## 2019-10-27 ENCOUNTER — Encounter: Payer: Self-pay | Admitting: *Deleted

## 2019-10-27 ENCOUNTER — Inpatient Hospital Stay (HOSPITAL_COMMUNITY): Payer: Medicare HMO

## 2019-10-27 DIAGNOSIS — Z951 Presence of aortocoronary bypass graft: Secondary | ICD-10-CM

## 2019-10-27 DIAGNOSIS — I5031 Acute diastolic (congestive) heart failure: Secondary | ICD-10-CM

## 2019-10-27 DIAGNOSIS — J95821 Acute postprocedural respiratory failure: Secondary | ICD-10-CM

## 2019-10-27 DIAGNOSIS — Q278 Other specified congenital malformations of peripheral vascular system: Secondary | ICD-10-CM

## 2019-10-27 LAB — POCT I-STAT 7, (LYTES, BLD GAS, ICA,H+H)
Acid-Base Excess: 1 mmol/L (ref 0.0–2.0)
Acid-Base Excess: 2 mmol/L (ref 0.0–2.0)
Acid-base deficit: 2 mmol/L (ref 0.0–2.0)
Acid-base deficit: 1 mmol/L (ref 0.0–2.0)
Acid-base deficit: 4 mmol/L — ABNORMAL HIGH (ref 0.0–2.0)
Acid-base deficit: 5 mmol/L — ABNORMAL HIGH (ref 0.0–2.0)
Acid-base deficit: 3 mmol/L — ABNORMAL HIGH (ref 0.0–2.0)
Acid-base deficit: 3 mmol/L — ABNORMAL HIGH (ref 0.0–2.0)
Bicarbonate: 28.3 mmol/L — ABNORMAL HIGH (ref 20.0–28.0)
Bicarbonate: 23.5 mmol/L (ref 20.0–28.0)
Bicarbonate: 25.6 mmol/L (ref 20.0–28.0)
Bicarbonate: 25.1 mmol/L (ref 20.0–28.0)
Bicarbonate: 21.9 mmol/L (ref 20.0–28.0)
Bicarbonate: 20.8 mmol/L (ref 20.0–28.0)
Bicarbonate: 22.9 mmol/L (ref 20.0–28.0)
Bicarbonate: 23 mmol/L (ref 20.0–28.0)
Calcium, Ion: 1.25 mmol/L (ref 1.15–1.40)
Calcium, Ion: 1.03 mmol/L — ABNORMAL LOW (ref 1.15–1.40)
Calcium, Ion: 1.23 mmol/L (ref 1.15–1.40)
Calcium, Ion: 1.05 mmol/L — ABNORMAL LOW (ref 1.15–1.40)
Calcium, Ion: 1.14 mmol/L — ABNORMAL LOW (ref 1.15–1.40)
Calcium, Ion: 1.15 mmol/L (ref 1.15–1.40)
Calcium, Ion: 1.13 mmol/L — ABNORMAL LOW (ref 1.15–1.40)
Calcium, Ion: 1.16 mmol/L (ref 1.15–1.40)
HCT: 37 % — ABNORMAL LOW (ref 39.0–52.0)
HCT: 30 % — ABNORMAL LOW (ref 39.0–52.0)
HCT: 32 % — ABNORMAL LOW (ref 39.0–52.0)
HCT: 33 % — ABNORMAL LOW (ref 39.0–52.0)
HCT: 28 % — ABNORMAL LOW (ref 39.0–52.0)
HCT: 29 % — ABNORMAL LOW (ref 39.0–52.0)
HCT: 28 % — ABNORMAL LOW (ref 39.0–52.0)
HCT: 27 % — ABNORMAL LOW (ref 39.0–52.0)
Hemoglobin: 12.6 g/dL — ABNORMAL LOW (ref 13.0–17.0)
Hemoglobin: 10.2 g/dL — ABNORMAL LOW (ref 13.0–17.0)
Hemoglobin: 10.9 g/dL — ABNORMAL LOW (ref 13.0–17.0)
Hemoglobin: 11.2 g/dL — ABNORMAL LOW (ref 13.0–17.0)
Hemoglobin: 9.5 g/dL — ABNORMAL LOW (ref 13.0–17.0)
Hemoglobin: 9.9 g/dL — ABNORMAL LOW (ref 13.0–17.0)
Hemoglobin: 9.5 g/dL — ABNORMAL LOW (ref 13.0–17.0)
Hemoglobin: 9.2 g/dL — ABNORMAL LOW (ref 13.0–17.0)
O2 Saturation: 100 %
O2 Saturation: 100 %
O2 Saturation: 93 %
O2 Saturation: 100 %
O2 Saturation: 93 %
O2 Saturation: 92 %
O2 Saturation: 88 %
O2 Saturation: 94 %
Patient temperature: 37
Patient temperature: 36.9
Patient temperature: 37
Patient temperature: 37
Potassium: 6 mmol/L — ABNORMAL HIGH (ref 3.5–5.1)
Potassium: 4.3 mmol/L (ref 3.5–5.1)
Potassium: 4.9 mmol/L (ref 3.5–5.1)
Potassium: 5.4 mmol/L — ABNORMAL HIGH (ref 3.5–5.1)
Potassium: 5.4 mmol/L — ABNORMAL HIGH (ref 3.5–5.1)
Potassium: 5.2 mmol/L — ABNORMAL HIGH (ref 3.5–5.1)
Potassium: 5.2 mmol/L — ABNORMAL HIGH (ref 3.5–5.1)
Potassium: 4.8 mmol/L (ref 3.5–5.1)
Sodium: 135 mmol/L (ref 135–145)
Sodium: 137 mmol/L (ref 135–145)
Sodium: 139 mmol/L (ref 135–145)
Sodium: 138 mmol/L (ref 135–145)
Sodium: 135 mmol/L (ref 135–145)
Sodium: 135 mmol/L (ref 135–145)
Sodium: 136 mmol/L (ref 135–145)
Sodium: 136 mmol/L (ref 135–145)
TCO2: 30 mmol/L (ref 22–32)
TCO2: 25 mmol/L (ref 22–32)
TCO2: 27 mmol/L (ref 22–32)
TCO2: 26 mmol/L (ref 22–32)
TCO2: 23 mmol/L (ref 22–32)
TCO2: 22 mmol/L (ref 22–32)
TCO2: 24 mmol/L (ref 22–32)
TCO2: 24 mmol/L (ref 22–32)
pCO2 arterial: 54.2 mmHg — ABNORMAL HIGH (ref 32.0–48.0)
pCO2 arterial: 34.2 mmHg (ref 32.0–48.0)
pCO2 arterial: 43.1 mmHg (ref 32.0–48.0)
pCO2 arterial: 47 mmHg (ref 32.0–48.0)
pCO2 arterial: 45.1 mmHg (ref 32.0–48.0)
pCO2 arterial: 42.5 mmHg (ref 32.0–48.0)
pCO2 arterial: 41 mmHg (ref 32.0–48.0)
pCO2 arterial: 44 mmHg (ref 32.0–48.0)
pH, Arterial: 7.326 — ABNORMAL LOW (ref 7.350–7.450)
pH, Arterial: 7.343 — ABNORMAL LOW (ref 7.350–7.450)
pH, Arterial: 7.482 — ABNORMAL HIGH (ref 7.350–7.450)
pH, Arterial: 7.335 — ABNORMAL LOW (ref 7.350–7.450)
pH, Arterial: 7.294 — ABNORMAL LOW (ref 7.350–7.450)
pH, Arterial: 7.298 — ABNORMAL LOW (ref 7.350–7.450)
pH, Arterial: 7.354 (ref 7.350–7.450)
pH, Arterial: 7.327 — ABNORMAL LOW (ref 7.350–7.450)
pO2, Arterial: 368 mmHg — ABNORMAL HIGH (ref 83.0–108.0)
pO2, Arterial: 261 mmHg — ABNORMAL HIGH (ref 83.0–108.0)
pO2, Arterial: 71 mmHg — ABNORMAL LOW (ref 83.0–108.0)
pO2, Arterial: 263 mmHg — ABNORMAL HIGH (ref 83.0–108.0)
pO2, Arterial: 74 mmHg — ABNORMAL LOW (ref 83.0–108.0)
pO2, Arterial: 70 mmHg — ABNORMAL LOW (ref 83.0–108.0)
pO2, Arterial: 57 mmHg — ABNORMAL LOW (ref 83.0–108.0)
pO2, Arterial: 76 mmHg — ABNORMAL LOW (ref 83.0–108.0)

## 2019-10-27 LAB — BASIC METABOLIC PANEL
Anion gap: 7 (ref 5–15)
Anion gap: 10 (ref 5–15)
BUN: 26 mg/dL — ABNORMAL HIGH (ref 8–23)
BUN: 29 mg/dL — ABNORMAL HIGH (ref 8–23)
CO2: 22 mmol/L (ref 22–32)
CO2: 21 mmol/L — ABNORMAL LOW (ref 22–32)
Calcium: 7.5 mg/dL — ABNORMAL LOW (ref 8.9–10.3)
Calcium: 8 mg/dL — ABNORMAL LOW (ref 8.9–10.3)
Chloride: 106 mmol/L (ref 98–111)
Chloride: 105 mmol/L (ref 98–111)
Creatinine, Ser: 1.84 mg/dL — ABNORMAL HIGH (ref 0.61–1.24)
Creatinine, Ser: 2.37 mg/dL — ABNORMAL HIGH (ref 0.61–1.24)
GFR calc Af Amer: 45 mL/min — ABNORMAL LOW (ref >60)
GFR calc Af Amer: 33 mL/min — ABNORMAL LOW (ref >60)
GFR calc non Af Amer: 39 mL/min — ABNORMAL LOW (ref >60)
GFR calc non Af Amer: 28 mL/min — ABNORMAL LOW (ref >60)
Glucose, Bld: 167 mg/dL — ABNORMAL HIGH (ref 70–99)
Glucose, Bld: 163 mg/dL — ABNORMAL HIGH (ref 70–99)
Potassium: 5.3 mmol/L — ABNORMAL HIGH (ref 3.5–5.1)
Potassium: 4.9 mmol/L (ref 3.5–5.1)
Sodium: 135 mmol/L (ref 135–145)
Sodium: 136 mmol/L (ref 135–145)

## 2019-10-27 LAB — POCT I-STAT, CHEM 8
BUN: 26 mg/dL — ABNORMAL HIGH (ref 8–23)
BUN: 25 mg/dL — ABNORMAL HIGH (ref 8–23)
BUN: 22 mg/dL (ref 8–23)
BUN: 25 mg/dL — ABNORMAL HIGH (ref 8–23)
BUN: 25 mg/dL — ABNORMAL HIGH (ref 8–23)
BUN: 25 mg/dL — ABNORMAL HIGH (ref 8–23)
Calcium, Ion: 1.29 mmol/L (ref 1.15–1.40)
Calcium, Ion: 1.24 mmol/L (ref 1.15–1.40)
Calcium, Ion: 1.02 mmol/L — ABNORMAL LOW (ref 1.15–1.40)
Calcium, Ion: 1.24 mmol/L (ref 1.15–1.40)
Calcium, Ion: 1.03 mmol/L — ABNORMAL LOW (ref 1.15–1.40)
Calcium, Ion: 1.06 mmol/L — ABNORMAL LOW (ref 1.15–1.40)
Chloride: 98 mmol/L (ref 98–111)
Chloride: 101 mmol/L (ref 98–111)
Chloride: 101 mmol/L (ref 98–111)
Chloride: 99 mmol/L (ref 98–111)
Chloride: 100 mmol/L (ref 98–111)
Chloride: 101 mmol/L (ref 98–111)
Creatinine, Ser: 1.6 mg/dL — ABNORMAL HIGH (ref 0.61–1.24)
Creatinine, Ser: 1.6 mg/dL — ABNORMAL HIGH (ref 0.61–1.24)
Creatinine, Ser: 1.3 mg/dL — ABNORMAL HIGH (ref 0.61–1.24)
Creatinine, Ser: 1.4 mg/dL — ABNORMAL HIGH (ref 0.61–1.24)
Creatinine, Ser: 1.5 mg/dL — ABNORMAL HIGH (ref 0.61–1.24)
Creatinine, Ser: 1.3 mg/dL — ABNORMAL HIGH (ref 0.61–1.24)
Glucose, Bld: 210 mg/dL — ABNORMAL HIGH (ref 70–99)
Glucose, Bld: 159 mg/dL — ABNORMAL HIGH (ref 70–99)
Glucose, Bld: 109 mg/dL — ABNORMAL HIGH (ref 70–99)
Glucose, Bld: 134 mg/dL — ABNORMAL HIGH (ref 70–99)
Glucose, Bld: 164 mg/dL — ABNORMAL HIGH (ref 70–99)
Glucose, Bld: 184 mg/dL — ABNORMAL HIGH (ref 70–99)
HCT: 34 % — ABNORMAL LOW (ref 39.0–52.0)
HCT: 28 % — ABNORMAL LOW (ref 39.0–52.0)
HCT: 22 % — ABNORMAL LOW (ref 39.0–52.0)
HCT: 31 % — ABNORMAL LOW (ref 39.0–52.0)
HCT: 26 % — ABNORMAL LOW (ref 39.0–52.0)
HCT: 25 % — ABNORMAL LOW (ref 39.0–52.0)
Hemoglobin: 11.6 g/dL — ABNORMAL LOW (ref 13.0–17.0)
Hemoglobin: 9.5 g/dL — ABNORMAL LOW (ref 13.0–17.0)
Hemoglobin: 10.5 g/dL — ABNORMAL LOW (ref 13.0–17.0)
Hemoglobin: 7.5 g/dL — ABNORMAL LOW (ref 13.0–17.0)
Hemoglobin: 8.8 g/dL — ABNORMAL LOW (ref 13.0–17.0)
Hemoglobin: 8.5 g/dL — ABNORMAL LOW (ref 13.0–17.0)
Potassium: 5.9 mmol/L — ABNORMAL HIGH (ref 3.5–5.1)
Potassium: 5 mmol/L (ref 3.5–5.1)
Potassium: 4.3 mmol/L (ref 3.5–5.1)
Potassium: 4.9 mmol/L (ref 3.5–5.1)
Potassium: 5.7 mmol/L — ABNORMAL HIGH (ref 3.5–5.1)
Potassium: 5.3 mmol/L — ABNORMAL HIGH (ref 3.5–5.1)
Sodium: 134 mmol/L — ABNORMAL LOW (ref 135–145)
Sodium: 135 mmol/L (ref 135–145)
Sodium: 135 mmol/L (ref 135–145)
Sodium: 138 mmol/L (ref 135–145)
Sodium: 136 mmol/L (ref 135–145)
Sodium: 137 mmol/L (ref 135–145)
TCO2: 32 mmol/L (ref 22–32)
TCO2: 28 mmol/L (ref 22–32)
TCO2: 25 mmol/L (ref 22–32)
TCO2: 28 mmol/L (ref 22–32)
TCO2: 30 mmol/L (ref 22–32)
TCO2: 26 mmol/L (ref 22–32)

## 2019-10-27 LAB — GLUCOSE, CAPILLARY
Glucose-Capillary: 104 mg/dL — ABNORMAL HIGH (ref 70–99)
Glucose-Capillary: 120 mg/dL — ABNORMAL HIGH (ref 70–99)
Glucose-Capillary: 120 mg/dL — ABNORMAL HIGH (ref 70–99)
Glucose-Capillary: 121 mg/dL — ABNORMAL HIGH (ref 70–99)
Glucose-Capillary: 123 mg/dL — ABNORMAL HIGH (ref 70–99)
Glucose-Capillary: 133 mg/dL — ABNORMAL HIGH (ref 70–99)
Glucose-Capillary: 134 mg/dL — ABNORMAL HIGH (ref 70–99)
Glucose-Capillary: 137 mg/dL — ABNORMAL HIGH (ref 70–99)
Glucose-Capillary: 138 mg/dL — ABNORMAL HIGH (ref 70–99)
Glucose-Capillary: 141 mg/dL — ABNORMAL HIGH (ref 70–99)
Glucose-Capillary: 141 mg/dL — ABNORMAL HIGH (ref 70–99)
Glucose-Capillary: 148 mg/dL — ABNORMAL HIGH (ref 70–99)
Glucose-Capillary: 152 mg/dL — ABNORMAL HIGH (ref 70–99)
Glucose-Capillary: 155 mg/dL — ABNORMAL HIGH (ref 70–99)
Glucose-Capillary: 159 mg/dL — ABNORMAL HIGH (ref 70–99)
Glucose-Capillary: 164 mg/dL — ABNORMAL HIGH (ref 70–99)
Glucose-Capillary: 165 mg/dL — ABNORMAL HIGH (ref 70–99)
Glucose-Capillary: 167 mg/dL — ABNORMAL HIGH (ref 70–99)
Glucose-Capillary: 197 mg/dL — ABNORMAL HIGH (ref 70–99)
Glucose-Capillary: 92 mg/dL (ref 70–99)

## 2019-10-27 LAB — CBC
HCT: 31.5 % — ABNORMAL LOW (ref 39.0–52.0)
HCT: 28.6 % — ABNORMAL LOW (ref 39.0–52.0)
Hemoglobin: 10.8 g/dL — ABNORMAL LOW (ref 13.0–17.0)
Hemoglobin: 9.8 g/dL — ABNORMAL LOW (ref 13.0–17.0)
MCH: 32.3 pg (ref 26.0–34.0)
MCH: 31.9 pg (ref 26.0–34.0)
MCHC: 34.3 g/dL (ref 30.0–36.0)
MCHC: 34.3 g/dL (ref 30.0–36.0)
MCV: 94.3 fL (ref 80.0–100.0)
MCV: 93.2 fL (ref 80.0–100.0)
Platelets: 154 10*3/uL (ref 150–400)
Platelets: 158 10*3/uL (ref 150–400)
RBC: 3.34 MIL/uL — ABNORMAL LOW (ref 4.22–5.81)
RBC: 3.07 MIL/uL — ABNORMAL LOW (ref 4.22–5.81)
RDW: 17.8 % — ABNORMAL HIGH (ref 11.5–15.5)
RDW: 17.9 % — ABNORMAL HIGH (ref 11.5–15.5)
WBC: 15.8 10*3/uL — ABNORMAL HIGH (ref 4.0–10.5)
WBC: 17.5 10*3/uL — ABNORMAL HIGH (ref 4.0–10.5)
nRBC: 0 % (ref 0.0–0.2)
nRBC: 0 % (ref 0.0–0.2)

## 2019-10-27 LAB — MAGNESIUM
Magnesium: 2.7 mg/dL — ABNORMAL HIGH (ref 1.7–2.4)
Magnesium: 2.5 mg/dL — ABNORMAL HIGH (ref 1.7–2.4)

## 2019-10-27 MED ORDER — INSULIN ASPART 100 UNIT/ML ~~LOC~~ SOLN
0.0000 [IU] | SUBCUTANEOUS | Status: DC
  Administered 2019-10-27: 2 [IU] via SUBCUTANEOUS
  Administered 2019-10-28 (×2): 4 [IU] via SUBCUTANEOUS
  Administered 2019-10-28 (×2): 8 [IU] via SUBCUTANEOUS
  Administered 2019-10-28: 4 [IU] via SUBCUTANEOUS
  Administered 2019-10-28: 8 [IU] via SUBCUTANEOUS
  Administered 2019-10-28: 2 [IU] via SUBCUTANEOUS
  Administered 2019-10-29 (×2): 4 [IU] via SUBCUTANEOUS
  Administered 2019-10-29 (×2): 8 [IU] via SUBCUTANEOUS
  Administered 2019-10-29: 12 [IU] via SUBCUTANEOUS
  Administered 2019-10-30 (×2): 4 [IU] via SUBCUTANEOUS
  Administered 2019-10-30: 2 [IU] via SUBCUTANEOUS
  Administered 2019-10-30: 4 [IU] via SUBCUTANEOUS
  Administered 2019-10-30 (×2): 8 [IU] via SUBCUTANEOUS
  Administered 2019-10-31: 2 [IU] via SUBCUTANEOUS
  Administered 2019-10-31: 8 [IU] via SUBCUTANEOUS
  Administered 2019-10-31: 3 [IU] via SUBCUTANEOUS
  Administered 2019-10-31: 2 [IU] via SUBCUTANEOUS
  Administered 2019-10-31: 4 [IU] via SUBCUTANEOUS
  Administered 2019-11-01: 2 [IU] via SUBCUTANEOUS
  Administered 2019-11-01 (×2): 4 [IU] via SUBCUTANEOUS
  Administered 2019-11-01 – 2019-11-04 (×6): 2 [IU] via SUBCUTANEOUS
  Administered 2019-11-05 – 2019-11-06 (×4): 8 [IU] via SUBCUTANEOUS
  Administered 2019-11-06 (×2): 2 [IU] via SUBCUTANEOUS

## 2019-10-27 MED ORDER — FUROSEMIDE 10 MG/ML IJ SOLN
10.0000 mg/h | INTRAVENOUS | Status: DC
  Administered 2019-10-27: 5 mg/h via INTRAVENOUS
  Administered 2019-10-29: 10 mg/h via INTRAVENOUS
  Administered 2019-10-29: 8 mg/h via INTRAVENOUS
  Administered 2019-10-30 – 2019-11-01 (×3): 10 mg/h via INTRAVENOUS
  Filled 2019-10-27: qty 5
  Filled 2019-10-27: qty 21
  Filled 2019-10-27 (×3): qty 25
  Filled 2019-10-27: qty 21

## 2019-10-27 MED ORDER — SODIUM CHLORIDE 0.9% FLUSH: 10.0000 mL | INTRAVENOUS | Status: DC | PRN

## 2019-10-27 MED ORDER — SORBITOL 70 % SOLN
30.0000 mL | Freq: Once | Status: AC
  Administered 2019-10-27: 30 mL via ORAL
  Filled 2019-10-27: qty 30

## 2019-10-27 MED ORDER — SODIUM CHLORIDE 0.9% FLUSH: 10.0000 mL | Freq: Two times a day (BID) | INTRAVENOUS | Status: DC

## 2019-10-27 MED ORDER — CHLORHEXIDINE GLUCONATE 0.12 % MT SOLN
15.0000 mL | Freq: Two times a day (BID) | OROMUCOSAL | Status: DC
  Administered 2019-10-27 – 2019-11-06 (×20): 15 mL via OROMUCOSAL
  Filled 2019-10-27 (×18): qty 15

## 2019-10-27 MED ORDER — LEVALBUTEROL HCL 0.63 MG/3ML IN NEBU
0.6300 mg | INHALATION_SOLUTION | Freq: Four times a day (QID) | RESPIRATORY_TRACT | Status: AC
  Administered 2019-10-27 – 2019-10-28 (×6): 0.63 mg via RESPIRATORY_TRACT
  Filled 2019-10-27 (×6): qty 3

## 2019-10-27 MED ORDER — HYDRALAZINE HCL 20 MG/ML IJ SOLN
20.0000 mg | INTRAMUSCULAR | Status: DC | PRN
  Administered 2019-10-27 – 2019-10-29 (×6): 20 mg via INTRAVENOUS
  Filled 2019-10-27 (×8): qty 1

## 2019-10-27 MED ORDER — ORAL CARE MOUTH RINSE
15.0000 mL | Freq: Two times a day (BID) | OROMUCOSAL | Status: DC
  Administered 2019-10-27 – 2019-11-03 (×9): 15 mL via OROMUCOSAL

## 2019-10-27 MED ORDER — FUROSEMIDE 10 MG/ML IJ SOLN
60.0000 mg | Freq: Once | INTRAMUSCULAR | Status: AC
  Administered 2019-10-27: 60 mg via INTRAVENOUS
  Filled 2019-10-27: qty 6

## 2019-10-27 NOTE — Progress Notes (Signed)
RT attempted to take patient off Bipap to 6L [place]. Patient's sats dropped to 76% and he had increased WOB.  RT placed patient back on Bipap. Sats recovered. Family and RN at bedside.

## 2019-10-27 NOTE — Progress Notes (Signed)
CT surgery p.m. Rounds  Patient extubated to BiPAP earlier today, postop day 1 CABG Patient receiving Lasix drip to help with oxygenation, creatinine increased to 2.3 but patient making adequate urine Blood pressure and cardiac index stable, Swan still in place.  Leave epinephrine at 2 mcg/min to help with diuresis

## 2019-10-27 NOTE — Anesthesia Postprocedure Evaluation (Signed)
Anesthesia Post Note  Patient: Larry Terrell  Procedure(s) Performed: CORONARY ARTERY BYPASS GRAFTING (CABG) x 5, Using Bilateral mammary arteries, and right leg greater saphenous vein harvested endoscopically (N/A Chest) RADIAL ARTERY HARVEST (Left Arm Lower) TRANSESOPHAGEAL ECHOCARDIOGRAM (TEE) (N/A ) INDOCYANINE GREEN FLUORESCENCE IMAGING (ICG) (N/A )     Patient location during evaluation: PACU Anesthesia Type: General Level of consciousness: awake and alert Pain management: pain level controlled Vital Signs Assessment: post-procedure vital signs reviewed and stable Respiratory status: spontaneous breathing, nonlabored ventilation, respiratory function stable and patient connected to nasal cannula oxygen Cardiovascular status: blood pressure returned to baseline and stable Postop Assessment: no apparent nausea or vomiting Anesthetic complications: no    Last Vitals:  Vitals:   10/27/19 1400 10/27/19 1500  BP: (!) 137/57 (!) 152/52  Pulse: 73 79  Resp: 14 20  Temp: 37.4 C 37.4 C  SpO2: 97% 95%    Last Pain:  Vitals:   10/27/19 1552  TempSrc:   PainSc: Larry Terrell

## 2019-10-27 NOTE — Progress Notes (Signed)
Patient extubated and placed on Bipap through the vent per CCM order.

## 2019-10-27 NOTE — Progress Notes (Signed)
1 Day Post-Op Procedure(s) (LRB): CORONARY ARTERY BYPASS GRAFTING (CABG) x 5, Using Bilateral mammary arteries, and right leg greater saphenous vein harvested endoscopically (N/A) RADIAL ARTERY HARVEST (Left) TRANSESOPHAGEAL ECHOCARDIOGRAM (TEE) (N/A) INDOCYANINE GREEN FLUORESCENCE IMAGING (ICG) (N/A) Subjective: Intubated, nods to voice  Objective: Vital signs in last 24 hours: Temp:  [97.3 F (36.3 C)-98.8 F (37.1 C)] 98.4 F (36.9 C) (04/16 0730) Pulse Rate:  [69-92] 70 (04/16 0730) Cardiac Rhythm: Atrial paced (04/16 0400) Resp:  [11-26] 13 (04/16 0730) BP: (106-145)/(52-79) 145/58 (04/16 0700) SpO2:  [79 %-100 %] 96 % (04/16 0730) Arterial Line BP: (87-205)/(47-181) 143/49 (04/16 0730) FiO2 (%):  [50 %-100 %] 60 % (04/16 0400)  Hemodynamic parameters for last 24 hours: PAP: (36-51)/(15-24) 47/19 CO:  [2.9 L/min-5 L/min] 5 L/min CI:  [1.5 L/min/m2-2.5 L/min/m2] 2.5 L/min/m2  Intake/Output from previous day: 04/15 0701 - 04/16 0700 In: 8059.2 [I.V.:6273.5; Blood:690; IV Piggyback:1095.7] Out: 1950 [Urine:1270; Chest Tube:680] Intake/Output this shift: No intake/output data recorded.  General appearance: alert and cooperative Neurologic: intact Heart: regular rate and rhythm, S1, S2 normal, no murmur, click, rub or gallop Lungs: clear to auscultation bilaterally Abdomen: soft, non-tender; bowel sounds normal; no masses,  no organomegaly Extremities: extremities normal, atraumatic, no cyanosis or edema Wound: dressed  Lab Results: Recent Labs    10/26/19 2020 10/26/19 2125 10/27/19 0328 10/27/19 0328 10/27/19 0337 10/27/19 0602  WBC 16.8*  --  15.8*  --   --   --   HGB 11.1*   < > 10.8*   < > 9.9* 9.5*  HCT 32.7*   < > 31.5*   < > 29.0* 28.0*  PLT 142*  --  154  --   --   --    < > = values in this interval not displayed.   BMET:  Recent Labs    10/26/19 2020 10/26/19 2125 10/27/19 0328 10/27/19 0328 10/27/19 0337 10/27/19 0602  NA 136   < > 135   <  > 135 136  K 5.9*   < > 5.3*   < > 5.2* 5.2*  CL 106  --  106  --   --   --   CO2 23  --  22  --   --   --   GLUCOSE 195*  --  167*  --   --   --   BUN 25*  --  26*  --   --   --   CREATININE 1.55*  --  1.84*  --   --   --   CALCIUM 7.3*  --  7.5*  --   --   --    < > = values in this interval not displayed.    PT/INR:  Recent Labs    10/26/19 1458  LABPROT 16.6*  INR 1.4*   ABG    Component Value Date/Time   PHART 7.354 10/27/2019 0602   HCO3 22.9 10/27/2019 0602   TCO2 24 10/27/2019 0602   ACIDBASEDEF 3.0 (H) 10/27/2019 0602   O2SAT 88.0 10/27/2019 0602   CBG (last 3)  Recent Labs    10/27/19 0423 10/27/19 0559 10/27/19 0735  GLUCAP 141* 138* 164*    Assessment/Plan: S/P Procedure(s) (LRB): CORONARY ARTERY BYPASS GRAFTING (CABG) x 5, Using Bilateral mammary arteries, and right leg greater saphenous vein harvested endoscopically (N/A) RADIAL ARTERY HARVEST (Left) TRANSESOPHAGEAL ECHOCARDIOGRAM (TEE) (N/A) INDOCYANINE GREEN FLUORESCENCE IMAGING (ICG) (N/A) Mobilize Diuresis ask Dr. Tamala Julian from CCM to see re: respiratory status/vent management  LOS: 7 days    Larry Terrell 10/27/2019

## 2019-10-27 NOTE — Discharge Summary (Signed)
Physician Discharge Summary  Patient ID: Larry Terrell MRN: [id number] DOB/AGE: 1961/03/25 59 y.o.  Admit date: 10/20/2019 Discharge date: 11/06/2019  Admission Diagnoses:  Patient Active Problem List   Diagnosis Date Noted  . CAD in native artery 10/20/2019  . Unstable angina ([place]) 10/20/2019  . (HFpEF) heart failure with preserved ejection fraction ([place]) 10/20/2019  . Essential hypertension 10/20/2019  . Hyperlipidemia LDL goal <70 10/20/2019  . PAD (peripheral artery disease) ([place]) 10/20/2019  . Chest pain 10/18/2019  . Altered mental status 06/04/2019  . Sepsis ([place]) 06/04/2019  . AKI (acute kidney injury) ([place]) 06/04/2019  . GERD (gastroesophageal reflux disease) 06/04/2019  . History of seizure 06/04/2019  . Stroke ([place]) 04/01/2019  . Acute appendicitis 07/10/2015  . S/P laparoscopic appendectomy 07/10/2015  . Appendicitis, acute   . Diabetes mellitus with complication ([place])   . Chronic congestive heart failure with left ventricular diastolic dysfunction ([place])   . Lactic acidosis   . Acute respiratory failure with hypoxia [facility])    Discharge Diagnoses:   Patient Active Problem List   Diagnosis Date Noted  . Open harvest of left radial artery 10/27/2019  . S/P CABG x 5   . CAD in native artery 10/20/2019  . Unstable angina ([place]) 10/20/2019  . (HFpEF) heart failure with preserved ejection fraction ([place]) 10/20/2019  . Essential hypertension 10/20/2019  . Hyperlipidemia LDL goal <70 10/20/2019  . PAD (peripheral artery disease) ([place]) 10/20/2019  . Chest pain 10/18/2019  . Altered mental status 06/04/2019  . Sepsis ([place]) 06/04/2019  . AKI (acute kidney injury) ([place]) 06/04/2019  . GERD (gastroesophageal reflux disease) 06/04/2019  . History of seizure 06/04/2019  . Stroke ([place]) 04/01/2019  . Acute appendicitis 07/10/2015  . S/P laparoscopic appendectomy 07/10/2015  . Appendicitis, acute   . Diabetes mellitus with complication ([place])   . Chronic congestive heart  failure with left ventricular diastolic dysfunction ([place])   . Lactic acidosis   . Acute respiratory failure with hypoxia ([facility])    Discharged Condition: good  History of Present Illness:  Mr. Lindaman is a 59 yo man with H/O CAD S/P PCI to RCA and Left Circumflex, PVD s/p ABF in 2003, TIA and DM.  He presented with complaints of left chest/shoudler pain that has been occurring over the last few weeks.  Due to his history, there was some concern about his experiencing his anginal equivalent.  He was taken to the catheterization lab and found to have 3V CAD.  It was felt the patient would benefit from coronary bypass grafting and TCTS consult was requested.  He was transferred to [facility] for further care.    Hospital Course:   He remained chest pain free during admission.  He was evaluated by Dr. Orvan Seen who felt patient would benefit from coronary bypass grafting procedure.  The risks and benefits of the procedure were explained to the patient and he was agreeable to proceed.  He was taken to the operating room on 10/26/2019 and underwent CABG x 5 utilizing LIMA to LAD, RIMA to PDA, and Sequential SVG to Distal OM, OM1, and Diagonal.  He also underwent endoscopic harvest of greater saphenous vein and open harvest of his left radial artery.  Unfortunately the left radial artery was poor conduit and was not able to be used.  He tolerated the procedure without difficulty and was taken to the SICU in stable condition.  The patient was difficult to wean off ventilator after surgery in the past.  Critical care  consult was obtained for assistance with vent wean.  He was weaned and extubated on POD #1.  He was started on Lasix drip for volume overload status.  He was weaned off Epinephrine. Milrinone, and Amiodarone as hemodynamics allowed.  He developed Atrial Fibrillation and was treated with IV Amiodarone.  He converted back to NSR, but continued to have brief episodes.  He was started on Eliquis for  stroke prophylaxis.  He had poor cough effort and NTS was performed and thickened sputum was removed and sent for culture.  This showed normal respiratory flora.  He was treated with antibiotics for suspected infection.  The patient had issues with dysphagia.  SLP evaluation was performed and patient was placed on dysphagia 3 diet.  He was transitioned to oral Amiodarone and IV Lasix on 11/01/2019.  He was hypokalemic and supplemented accordingly.  He is deconditioned.  He is participating with PT/OT, whom have recommended SNF placement.  His incisions are healing without evidence of infection.  He is tolerating a diet.  He is medically stable for discharge to SNF today.  Consults: pulmonary/intensive care  Significant Diagnostic Studies: angiography:    Balloon angioplasty was performed.  Previously placed Mid Cx to Dist Cx drug eluting stent is widely patent.  Balloon angioplasty was performed.  Mid RCA lesion is 80% stenosed.  Mid RCA to Dist RCA lesion is 10% stenosed.  Prox Cx lesion is 100% stenosed.  Ost Ramus lesion is 80% stenosed.  Prox LAD lesion is 95% stenosed.  Dist LAD lesion is 45% stenosed.  Mid LM lesion is 30% stenosed.  Ost RPDA lesion is 80% stenosed.  The left ventricular ejection fraction is 50-55% by visual estimate.  LV end diastolic pressure is mildly elevated.  There is mild left ventricular systolic dysfunction.  Treatments: surgery:    Coronary Artery Bypass Grafting x5              Left Internal Mammary Artery to Distal Left Anterior Descending Coronary Artery; pedicled RIMA Graft to  Posterior Descending Coronary Artery; Reversed saphenous Vein Graft to distal Obtuse Marginal Branch of Left Circumflex Coronary Artery, 1st OM Branch of Left Circumflex Coronary Artery and 1st Diagonal Branch Coronary Artery as sequenced graft; Endoscopic Vein Harvest from right Thigh ; open left radial artery harvesting (graft not used due to poor  quality);  Multilevel rib block with Exparel solution  Rigid sternal reconstruction with linear plating system  Completion graft surveillance with indocyanine green fluorescence imaging (SPY)  Discharge Exam: Blood pressure 128/85, pulse 87, temperature 97.9 F (36.6 C), temperature source Oral, resp. rate 17, height 5' 7"  (1.702 m), weight 83.7 kg, SpO2 97 %.  General appearance: alert, cooperative and no distress Heart: regular rate and rhythm Lungs: clear to auscultation bilaterally Abdomen: soft, non-tender; bowel sounds normal; no masses,  no organomegaly Extremities: edema trace Wound: clean and dry  Discharge Medications:   The patient has been discharged on:   1.Beta Blocker:  Yes [ X  ]                              No   [   ]                              If No, reason:  2.Ace Inhibitor/ARB: Yes [   ]  No  [ X   ]                                     If No, reason: elevated creatinine  3.Statin:   Yes [ x  ]                  No  [   ]                  If No, reason:  4.Ecasa:  Yes  [ X  ]                  No   [   ]                  If No, reason:    Allergies as of 11/06/2019      Reactions   Gabapentin    Ace Inhibitors Cough   Other reaction(s): Other (See Comments) cough      Medication List    STOP taking these medications   amLODipine 5 MG tablet Commonly known as: NORVASC   furosemide 10 MG/ML injection Commonly known as: LASIX Replaced by: furosemide 40 MG tablet   heparin 25000-0.45 UT/250ML-% infusion   losartan 100 MG tablet Commonly known as: COZAAR   metoprolol succinate 100 MG 24 hr tablet Commonly known as: TOPROL-XL     TAKE these medications   acetaminophen 500 MG tablet Commonly known as: TYLENOL Take 1,000 mg by mouth daily as needed.   amiodarone 200 MG tablet Commonly known as: PACERONE Take 1 tablet (200 mg total) by mouth 2 (two) times daily.   apixaban 5 MG Tabs  tablet Commonly known as: ELIQUIS Take 1 tablet (5 mg total) by mouth 2 (two) times daily.   aspirin 81 MG chewable tablet Chew 1 tablet (81 mg total) by mouth daily.   atorvastatin 80 MG tablet Commonly known as: LIPITOR Take 1 tablet (80 mg total) by mouth daily. What changed: Another medication with the same name was removed. Continue taking this medication, and follow the directions you see here.   ezetimibe 10 MG tablet Commonly known as: ZETIA Take 1 tablet (10 mg total) by mouth daily.   famotidine 20 MG tablet Commonly known as: Pepcid Take 1 tablet (20 mg total) by mouth 2 (two) times daily. What changed:   when to take this  reasons to take this   furosemide 40 MG tablet Commonly known as: LASIX Take 1 tablet (40 mg total) by mouth daily. Replaces: furosemide 10 MG/ML injection   insulin aspart 100 UNIT/ML injection Commonly known as: novoLOG Inject 0-15 Units into the skin 4 (four) times daily - after meals and at bedtime.   insulin aspart 100 UNIT/ML injection Commonly known as: novoLOG Inject 10 Units into the skin 3 (three) times daily with meals.   insulin glargine 100 UNIT/ML injection Commonly known as: LANTUS Inject 0.05 mLs (5 Units total) into the skin daily.   isosorbide dinitrate 20 MG tablet Commonly known as: ISORDIL Take 1 tablet (20 mg total) by mouth 3 (three) times daily. For 1 month, then discontinue   levETIRAcetam 500 MG tablet Commonly known as: KEPPRA Take 500 mg by mouth 3 (three) times daily.   metFORMIN 500 MG tablet Commonly known as: GLUCOPHAGE Take 500 mg by mouth 2 (two) times daily with a meal.   Metoprolol  Tartrate 37.5 MG Tabs Take 37.5 mg by mouth 2 (two) times daily.   nitroGLYCERIN 0.4 MG SL tablet Commonly known as: NITROSTAT Place 0.4 mg under the tongue every 5 (five) minutes as needed for chest pain.   oxyCODONE 5 MG immediate release tablet Commonly known as: Oxy IR/ROXICODONE Take 1-2 tablets (5-10 mg  total) by mouth every 4 (four) hours as needed for severe pain. What changed:   how much to take  when to take this  reasons to take this   pantoprazole 40 MG tablet Commonly known as: PROTONIX Take 1 tablet (40 mg total) by mouth daily.   potassium chloride SA 20 MEQ tablet Commonly known as: KLOR-CON Take 1 tablet (20 mEq total) by mouth daily. For 7 days   pregabalin 50 MG capsule Commonly known as: Lyrica Take 1 capsule (50 mg total) by mouth 3 (three) times daily.   venlafaxine 75 MG tablet Commonly known as: EFFEXOR Take 75 mg by mouth 3 (three) times daily.       Contact information for follow-up providers    Wonda Olds, MD Follow up on 11/13/2019.   Specialty: Cardiothoracic Surgery Why: Appointment is at 1:00 Contact information: [place] [address] [phone]        Loel Dubonnet, NP Follow up on 11/14/2019.   Specialty: Cardiology Why: Appointment is at 9:00 Contact information: [place] [address] [phone]            Contact information for after-discharge care    Destination    [facility] Preferred SNF .   Service: Skilled Nursing Contact information: [address] [place] [phone]                  Signed:  Ellwood Handler, PA-C 11/06/2019, 8:49 AM

## 2019-10-27 NOTE — Plan of Care (Signed)
  Problem: Clinical Measurements: Goal: Ability to maintain clinical measurements within normal limits will improve Outcome: Progressing Goal: Will remain free from infection Outcome: Progressing Goal: Diagnostic test results will improve Outcome: Progressing Goal: Respiratory complications will improve Outcome: Progressing Goal: Cardiovascular complication will be avoided Outcome: Progressing   Problem: Elimination: Goal: Will not experience complications related to bowel motility Outcome: Progressing Goal: Will not experience complications related to urinary retention Outcome: Progressing   Problem: Pain Managment: Goal: General experience of comfort will improve Outcome: Progressing   Problem: Skin Integrity: Goal: Risk for impaired skin integrity will decrease Outcome: Progressing   Problem: Respiratory: Goal: Respiratory status will improve Outcome: Progressing

## 2019-10-27 NOTE — Progress Notes (Signed)
RT changed patient's ETT holder. Patient slightly anxious. Vital signs stable throughout. RN at bedside. RT will continue to monitor.

## 2019-10-27 NOTE — Consult Note (Signed)
NAME:  Larry Terrell, MRN:  [id number], DOB:  11/14/1960, LOS: 7 ADMISSION DATE:  10/20/2019, CONSULTATION DATE:  10/27/19 REFERRING MD:  Julien Girt, CHIEF COMPLAINT:  Chest pain   Brief History   59 year old man with history of metabolic syndrome who presented with anginal symptoms ultimately requiring CABG x 5 on 10/26/19 presenting with post-op respiratory failure.  History of present illness   59 year old man with history of metabolic syndrome who presented with anginal symptoms ultimately requiring CABG x 5 on 10/26/19 presenting with post-op respiratory failure.  He carries a diagnosis of COPD although his imaging and PFTs do not support this.    Looks like back in 2016 had trouble with extubation after appendectomy but was weaned after diuresis.    Past Medical History  Severe CAD s/p CABG PAD s/p Aorto-bifemoral bypass DM HTN Hepatic steatosis and splenomegaly   Significant Hospital Events   4/15 CABG x 5  Consults:  PCCM  Procedures:  N/A  Significant Diagnostic Tests:  CXR chest tubes in place, low lung volumes, atelectsis  Micro Data:  COVID neg MRSA PCR neg   Antimicrobials:  Vanc+cefuroxime x 1 10/26/19   Interim history/subjective:  Consulted  Objective   Blood pressure (!) 149/50, pulse 73, temperature 98.4 F (36.9 C), resp. rate 20, height 5' 7"  (1.702 m), weight 90.7 kg, SpO2 94 %. PAP: (36-51)/(15-24) 47/19 CO:  [2.9 L/min-5 L/min] 5 L/min CI:  [1.5 L/min/m2-2.5 L/min/m2] 2.5 L/min/m2  Vent Mode: PSV;CPAP FiO2 (%):  [45 %-100 %] 45 % Set Rate:  [12 bmp-16 bmp] 16 bmp Vt Set:  [520 mL] 520 mL PEEP:  [5 cmH20-8 cmH20] 8 cmH20 Pressure Support:  [10 cmH20] 10 cmH20 Plateau Pressure:  [16 cmH20-21 cmH20] 16 cmH20   Intake/Output Summary (Last 24 hours) at 10/27/2019 0811 Last data filed at 10/27/2019 0700 Gross per 24 hour  Intake 8059.21 ml  Output 1950 ml  Net 6109.21 ml   Filed Weights   10/24/19 0600 10/25/19 0500 10/26/19 0500  Weight:  91.3 kg 90.7 kg 90.7 kg    Examination: GEN: obese man on vent HEENT: ETT in place, minimal secretions CV: RRR, ext warm PULM: Good TV on PS, no wheezing GI: Protuberant, +BS EXT: trace edema NEURO: Moves all 4 ext to command PSYCH: RASS -1 SKIN: No rashes, incision site CDI  ABG with normal acid-base, low pAO2  Resolved Hospital Problem list   N/A  Assessment & Plan:  Post-CABG hypoxemic respiratory failure related to de-recruitment in patient with baseline restrictive lung disease.  Etiology of the restrictive lung disease is some combination of fluid overload, obesity-related, and effects of his severe hepatosplenomegaly.  I do not think he has big element of bronchospasm. - Be generous with PEEP - Use bed to reverse trendelenberg - Diuretic trial to start - Precedex PRN for sedation, need to balance sedation vs. Loss of diaphragmatic activity which worsens basilar atelectasis - Keep bowels moving - He would be good candidate for extubation to BIPAP to maximize chances of success, will discuss with primary, can consider this afternoon if has re-recruited  Best practice:  Diet: per primary Pain/Anxiety/Delirium protocol (if indicated): precedex PRN VAP protocol (if indicated): in place DVT prophylaxis: per primary GI prophylaxis: PPI Glucose control: Insulin gtt Mobility: BR Code Status: full Family Communication: per primary Disposition: ICU pending vent liberation  Labs   CBC: Recent Labs  Lab 10/25/19 0407 10/25/19 0407 10/26/19 0500 10/26/19 0500 10/26/19 1241 10/26/19 1241 10/26/19 1458  10/26/19 1502 10/26/19 2020 10/26/19 2125 10/26/19 2316 10/27/19 0119 10/27/19 0328 10/27/19 0337 10/27/19 0602  WBC 7.7  --  8.7  --   --   --  18.4*  --  16.8*  --   --   --  15.8*  --   --   HGB 12.1*   < > 13.4   < > 9.3*   < > 12.6*   < > 11.1*   < > 10.2* 9.5* 10.8* 9.9* 9.5*  HCT 34.6*   < > 38.6*   < > 26.7*   < > 37.0*   < > 32.7*   < > 30.0* 28.0* 31.5*  29.0* 28.0*  MCV 94.8  --  96.7  --   --   --  93.4  --  93.2  --   --   --  94.3  --   --   PLT 121*   < > 149*  --  107*  --  143*  --  142*  --   --   --  154  --   --    < > = values in this interval not displayed.    Basic Metabolic Panel: Recent Labs  Lab 10/24/19 0320 10/24/19 0320 10/25/19 0407 10/25/19 0407 10/26/19 0500 10/26/19 1502 10/26/19 2020 10/26/19 2125 10/26/19 2316 10/27/19 0119 10/27/19 0328 10/27/19 0337 10/27/19 0602  NA 137   < > 135   < > 136   < > 136   < > 136 135 135 135 136  K 4.4   < > 4.6   < > 4.8   < > 5.9*   < > 5.1 5.4* 5.3* 5.2* 5.2*  CL 101  --  99  --  100  --  106  --   --   --  106  --   --   CO2 27  --  25  --  24  --  23  --   --   --  22  --   --   GLUCOSE 178*  --  177*  --  192*  --  195*  --   --   --  167*  --   --   BUN 27*  --  26*  --  25*  --  25*  --   --   --  26*  --   --   CREATININE 1.54*  --  1.59*  --  1.54*  --  1.55*  --   --   --  1.84*  --   --   CALCIUM 9.1  --  9.1  --  9.3  --  7.3*  --   --   --  7.5*  --   --   MG  --   --   --   --   --   --  2.9*  --   --   --  2.7*  --   --    < > = values in this interval not displayed.   GFR: Estimated Creatinine Clearance: 45.3 mL/min (A) (by C-G formula based on SCr of 1.84 mg/dL (H)). Recent Labs  Lab 10/26/19 0500 10/26/19 1458 10/26/19 2020 10/27/19 0328  WBC 8.7 18.4* 16.8* 15.8*    Liver Function Tests: No results for input(s): AST, ALT, ALKPHOS, BILITOT, PROT, ALBUMIN in the last 168 hours. No results for input(s): LIPASE, AMYLASE in the last 168 hours. No results for input(s): AMMONIA  in the last 168 hours.  ABG    Component Value Date/Time   PHART 7.354 10/27/2019 0602   PCO2ART 41.0 10/27/2019 0602   PO2ART 57.0 (L) 10/27/2019 0602   HCO3 22.9 10/27/2019 0602   TCO2 24 10/27/2019 0602   ACIDBASEDEF 3.0 (H) 10/27/2019 0602   O2SAT 88.0 10/27/2019 0602     Coagulation Profile: Recent Labs  Lab 10/26/19 1458  INR 1.4*    Cardiac  Enzymes: No results for input(s): CKTOTAL, CKMB, CKMBINDEX, TROPONINI in the last 168 hours.  HbA1C: Hemoglobin A1C  Date/Time Value Ref Range Status  12/15/2013 03:43 AM 8.3 (H) 4.2 - 6.3 % Final    Comment:    The American Diabetes Association recommends that a primary goal of therapy should be <7% and that physicians should reevaluate the treatment regimen in patients with HbA1c values consistently >8%.   10/03/2013 12:11 PM 7.5 (H) 4.2 - 6.3 % Final    Comment:    The American Diabetes Association recommends that a primary goal of therapy should be <7% and that physicians should reevaluate the treatment regimen in patients with HbA1c values consistently >8%.    Hgb A1c MFr Bld  Date/Time Value Ref Range Status  10/18/2019 10:56 PM 7.3 (H) 4.8 - 5.6 % Final    Comment:    (NOTE)         Prediabetes: 5.7 - 6.4         Diabetes: >6.4         Glycemic control for adults with diabetes: <7.0   06/05/2019 04:52 AM 6.9 (H) 4.8 - 5.6 % Final    Comment:    (NOTE) Pre diabetes:          5.7%-6.4% Diabetes:              >6.4% Glycemic control for   <7.0% adults with diabetes     CBG: Recent Labs  Lab 10/27/19 0221 10/27/19 0334 10/27/19 0423 10/27/19 0559 10/27/19 0735  GLUCAP 167* 165* 141* 138* 164*    Review of Systems:   Cannot assess, intubated.  Does nod head yes to chest wall pain.  Past Medical History  He,  has a past medical history of CHF (congestive heart failure) ([place]), Cirrhosis ([place]), COPD (chronic obstructive pulmonary disease) ([place]), Depression, Diabetes mellitus with neuropathy ([place]), Heart attack ([place]), Neuropathy, OSA (obstructive sleep apnea), Peripheral vascular disease due to secondary diabetes mellitus ([place]), Splenomegaly, Temporal giant cell arteritis ([place]), and TIA (transient ischemic attack) (April 2016).   Surgical History    Past Surgical History:  Procedure Laterality Date  . AORTA - FEMORAL ARTERY BYPASS GRAFT Bilateral   . CARDIAC  CATHETERIZATION    . cardiac stents  July 2015   . CORONARY STENT INTERVENTION N/A 08/09/2017   Procedure: CORONARY STENT INTERVENTION;  Surgeon: Wellington Hampshire, MD;  Location: [facility];  Service: Cardiovascular;  Laterality: N/A;  . LAPAROSCOPIC APPENDECTOMY N/A 07/10/2015   Procedure: APPENDECTOMY LAPAROSCOPIC;  Surgeon: Rolm Bookbinder, MD;  Location: [facility];  Service: General;  Laterality: N/A;  . LEFT HEART CATH AND CORONARY ANGIOGRAPHY N/A 08/09/2017   Procedure: LEFT HEART CATH AND CORONARY ANGIOGRAPHY;  Surgeon: Wellington Hampshire, MD;  Location: [facility];  Service: Cardiovascular;  Laterality: N/A;  . LEFT HEART CATH AND CORONARY ANGIOGRAPHY N/A 10/19/2019   Procedure: LEFT HEART CATH AND CORONARY ANGIOGRAPHY;  Surgeon: Minna Merritts, MD;  Location: [facility];  Service: Cardiovascular;  Laterality: N/A;  Social History   reports that he quit smoking about 12 years ago. His smoking use included cigarettes. He has never used smokeless tobacco. He reports that he does not drink alcohol or use drugs.   Family History   His family history includes Alzheimer's disease in his father; Heart attack in his father; Heart attack (age of onset: 70) in his mother; Stroke in his mother.   Allergies Allergies  Allergen Reactions  . Gabapentin   . Ace Inhibitors Cough    Other reaction(s): Other (See Comments) cough     Home Medications  Prior to Admission medications   Medication Sig Start Date End Date Taking? Authorizing Provider  acetaminophen (TYLENOL) 500 MG tablet Take 1,000 mg by mouth daily as needed.    [provider]  amLODipine (NORVASC) 5 MG tablet Take 5 mg by mouth daily. 09/04/19 09/03/20  [provider]  aspirin 81 MG chewable tablet Chew 1 tablet (81 mg total) by mouth daily. 10/20/19   Ezekiel Slocumb, DO  atorvastatin (LIPITOR) 40 MG tablet Take 40 mg by mouth daily.     [provider]  atorvastatin  (LIPITOR) 80 MG tablet Take 1 tablet (80 mg total) by mouth daily. 10/21/19   Minna Merritts, MD  ezetimibe (ZETIA) 10 MG tablet Take 1 tablet (10 mg total) by mouth daily. 10/20/19   Minna Merritts, MD  famotidine (PEPCID) 20 MG tablet Take 1 tablet (20 mg total) by mouth 2 (two) times daily. Patient taking differently: Take 20 mg by mouth daily as needed.  12/26/17   Paulette Blanch, MD  furosemide (LASIX) 10 MG/ML injection Inject 4 mLs (40 mg total) into the vein every 12 (twelve) hours. 10/20/19   Nicole Kindred A, DO  heparin 25000-0.45 UT/250ML-% infusion Inject 1,150 Units/hr into the vein continuous. 10/20/19   Nicole Kindred A, DO  insulin aspart (NOVOLOG) 100 UNIT/ML injection Inject 0-15 Units into the skin 4 (four) times daily - after meals and at bedtime. 10/20/19   Nicole Kindred A, DO  insulin aspart (NOVOLOG) 100 UNIT/ML injection Inject 10 Units into the skin 3 (three) times daily with meals. 10/20/19   Nicole Kindred A, DO  insulin glargine (LANTUS) 100 UNIT/ML injection Inject 0.05 mLs (5 Units total) into the skin daily. 10/20/19   Ezekiel Slocumb, DO  levETIRAcetam (KEPPRA) 500 MG tablet Take 500 mg by mouth 3 (three) times daily. 10/03/19   [provider]  losartan (COZAAR) 100 MG tablet Take 100 mg by mouth daily. 05/11/19   [provider]  metFORMIN (GLUCOPHAGE) 500 MG tablet Take 500 mg by mouth 2 (two) times daily with a meal.    [provider]  metoprolol succinate (TOPROL-XL) 100 MG 24 hr tablet Take 100 mg by mouth daily. 05/11/19   [provider]  nitroGLYCERIN (NITROSTAT) 0.4 MG SL tablet Place 0.4 mg under the tongue every 5 (five) minutes as needed for chest pain.    [provider]  oxyCODONE (OXY IR/ROXICODONE) 5 MG immediate release tablet Take 5 mg by mouth every 8 (eight) hours as needed for pain. 05/18/19   [provider]  pantoprazole (PROTONIX) 40 MG tablet Take 1 tablet (40 mg total) by mouth daily. 10/20/19    Ezekiel Slocumb, DO  pregabalin (LYRICA) 50 MG capsule Take 1 capsule (50 mg total) by mouth 3 (three) times daily. 10/20/19 10/19/20  Nicole Kindred A, DO  venlafaxine (EFFEXOR) 75 MG tablet Take 75 mg by mouth  3 (three) times daily. 05/11/19   [provider]     Critical care time: 36 minutes

## 2019-10-27 NOTE — Procedures (Signed)
Extubation Procedure Note  Patient Details:   Name: Larry Terrell DOB: 11-Dec-1960 MRN: [id number]   Airway Documentation:    Vent end date: 10/27/19 Vent end time: 1310   Evaluation  O2 sats: stable throughout Complications: No apparent complications Patient did tolerate procedure well. Bilateral Breath Sounds: Diminished   Yes   Positive cuff leak noted. Patient placed on NON-invasive ventilation with a large mask through the vent.  No stridor noted. MD and RN at bedside.  [place] Amber Dacey Milberger 10/27/2019, 1:23 PM

## 2019-10-28 ENCOUNTER — Inpatient Hospital Stay (HOSPITAL_COMMUNITY): Payer: Medicare HMO

## 2019-10-28 DIAGNOSIS — J9601 Acute respiratory failure with hypoxia: Secondary | ICD-10-CM

## 2019-10-28 LAB — GLUCOSE, CAPILLARY
Glucose-Capillary: 179 mg/dL — ABNORMAL HIGH (ref 70–99)
Glucose-Capillary: 219 mg/dL — ABNORMAL HIGH (ref 70–99)
Glucose-Capillary: 184 mg/dL — ABNORMAL HIGH (ref 70–99)
Glucose-Capillary: 218 mg/dL — ABNORMAL HIGH (ref 70–99)
Glucose-Capillary: 203 mg/dL — ABNORMAL HIGH (ref 70–99)
Glucose-Capillary: 239 mg/dL — ABNORMAL HIGH (ref 70–99)

## 2019-10-28 LAB — POCT I-STAT 7, (LYTES, BLD GAS, ICA,H+H)
Acid-base deficit: 2 mmol/L (ref 0.0–2.0)
Acid-base deficit: 3 mmol/L — ABNORMAL HIGH (ref 0.0–2.0)
Acid-base deficit: 4 mmol/L — ABNORMAL HIGH (ref 0.0–2.0)
Bicarbonate: 20.9 mmol/L (ref 20.0–28.0)
Bicarbonate: 21.6 mmol/L (ref 20.0–28.0)
Bicarbonate: 23.6 mmol/L (ref 20.0–28.0)
Calcium, Ion: 1.17 mmol/L (ref 1.15–1.40)
Calcium, Ion: 1.18 mmol/L (ref 1.15–1.40)
Calcium, Ion: 1.19 mmol/L (ref 1.15–1.40)
HCT: 26 % — ABNORMAL LOW (ref 39.0–52.0)
HCT: 26 % — ABNORMAL LOW (ref 39.0–52.0)
HCT: 30 % — ABNORMAL LOW (ref 39.0–52.0)
Hemoglobin: 10.2 g/dL — ABNORMAL LOW (ref 13.0–17.0)
Hemoglobin: 8.8 g/dL — ABNORMAL LOW (ref 13.0–17.0)
Hemoglobin: 8.8 g/dL — ABNORMAL LOW (ref 13.0–17.0)
O2 Saturation: 94 %
O2 Saturation: 95 %
O2 Saturation: 96 %
Patient temperature: 33.1
Patient temperature: 37.2
Patient temperature: 38
Potassium: 4.9 mmol/L (ref 3.5–5.1)
Potassium: 5.2 mmol/L — ABNORMAL HIGH (ref 3.5–5.1)
Potassium: 5.3 mmol/L — ABNORMAL HIGH (ref 3.5–5.1)
Sodium: 133 mmol/L — ABNORMAL LOW (ref 135–145)
Sodium: 134 mmol/L — ABNORMAL LOW (ref 135–145)
Sodium: 134 mmol/L — ABNORMAL LOW (ref 135–145)
TCO2: 22 mmol/L (ref 22–32)
TCO2: 23 mmol/L (ref 22–32)
TCO2: 25 mmol/L (ref 22–32)
pCO2 arterial: 36.6 mmHg (ref 32.0–48.0)
pCO2 arterial: 37.1 mmHg (ref 32.0–48.0)
pCO2 arterial: 38.6 mmHg (ref 32.0–48.0)
pH, Arterial: 7.343 — ABNORMAL LOW (ref 7.350–7.450)
pH, Arterial: 7.377 (ref 7.350–7.450)
pH, Arterial: 7.399 (ref 7.350–7.450)
pO2, Arterial: 66 mmHg — ABNORMAL LOW (ref 83.0–108.0)
pO2, Arterial: 74 mmHg — ABNORMAL LOW (ref 83.0–108.0)
pO2, Arterial: 81 mmHg — ABNORMAL LOW (ref 83.0–108.0)

## 2019-10-28 LAB — RENAL FUNCTION PANEL
Albumin: 3 g/dL — ABNORMAL LOW (ref 3.5–5.0)
Anion gap: 13 (ref 5–15)
BUN: 33 mg/dL — ABNORMAL HIGH (ref 8–23)
CO2: 21 mmol/L — ABNORMAL LOW (ref 22–32)
Calcium: 8.2 mg/dL — ABNORMAL LOW (ref 8.9–10.3)
Chloride: 101 mmol/L (ref 98–111)
Creatinine, Ser: 2.45 mg/dL — ABNORMAL HIGH (ref 0.61–1.24)
GFR calc Af Amer: 32 mL/min — ABNORMAL LOW (ref >60)
GFR calc non Af Amer: 27 mL/min — ABNORMAL LOW (ref >60)
Glucose, Bld: 235 mg/dL — ABNORMAL HIGH (ref 70–99)
Phosphorus: 4.8 mg/dL — ABNORMAL HIGH (ref 2.5–4.6)
Potassium: 4.6 mmol/L (ref 3.5–5.1)
Sodium: 135 mmol/L (ref 135–145)

## 2019-10-28 LAB — COMPREHENSIVE METABOLIC PANEL
ALT: 20 U/L (ref 0–44)
AST: 22 U/L (ref 15–41)
Albumin: 3.2 g/dL — ABNORMAL LOW (ref 3.5–5.0)
Alkaline Phosphatase: 50 U/L (ref 38–126)
Anion gap: 11 (ref 5–15)
BUN: 30 mg/dL — ABNORMAL HIGH (ref 8–23)
CO2: 20 mmol/L — ABNORMAL LOW (ref 22–32)
Calcium: 8.2 mg/dL — ABNORMAL LOW (ref 8.9–10.3)
Chloride: 102 mmol/L (ref 98–111)
Creatinine, Ser: 2.52 mg/dL — ABNORMAL HIGH (ref 0.61–1.24)
GFR calc Af Amer: 31 mL/min — ABNORMAL LOW (ref >60)
GFR calc non Af Amer: 26 mL/min — ABNORMAL LOW (ref >60)
Glucose, Bld: 187 mg/dL — ABNORMAL HIGH (ref 70–99)
Potassium: 5.4 mmol/L — ABNORMAL HIGH (ref 3.5–5.1)
Sodium: 133 mmol/L — ABNORMAL LOW (ref 135–145)
Total Bilirubin: 1.4 mg/dL — ABNORMAL HIGH (ref 0.3–1.2)
Total Protein: 5.7 g/dL — ABNORMAL LOW (ref 6.5–8.1)

## 2019-10-28 LAB — CBC
HCT: 28.9 % — ABNORMAL LOW (ref 39.0–52.0)
Hemoglobin: 9.8 g/dL — ABNORMAL LOW (ref 13.0–17.0)
MCH: 32.2 pg (ref 26.0–34.0)
MCHC: 33.9 g/dL (ref 30.0–36.0)
MCV: 95.1 fL (ref 80.0–100.0)
Platelets: 126 10*3/uL — ABNORMAL LOW (ref 150–400)
RBC: 3.04 MIL/uL — ABNORMAL LOW (ref 4.22–5.81)
RDW: 18.8 % — ABNORMAL HIGH (ref 11.5–15.5)
WBC: 12.9 10*3/uL — ABNORMAL HIGH (ref 4.0–10.5)
nRBC: 0 % (ref 0.0–0.2)

## 2019-10-28 MED ORDER — IPRATROPIUM-ALBUTEROL 0.5-2.5 (3) MG/3ML IN SOLN: 3.0000 mL | Freq: Four times a day (QID) | RESPIRATORY_TRACT | Status: DC | PRN

## 2019-10-28 MED ORDER — METOLAZONE 5 MG PO TABS
5.0000 mg | ORAL_TABLET | Freq: Once | ORAL | Status: AC
  Administered 2019-10-28: 5 mg via ORAL
  Filled 2019-10-28: qty 1

## 2019-10-28 MED ORDER — ASPIRIN 300 MG RE SUPP
300.0000 mg | Freq: Every day | RECTAL | Status: DC
  Administered 2019-10-28 – 2019-10-29 (×2): 300 mg via RECTAL
  Filled 2019-10-28 (×2): qty 1

## 2019-10-28 MED ORDER — LEVETIRACETAM IN NACL 500 MG/100ML IV SOLN
500.0000 mg | Freq: Three times a day (TID) | INTRAVENOUS | Status: DC
  Administered 2019-10-28 – 2019-10-31 (×9): 500 mg via INTRAVENOUS
  Filled 2019-10-28 (×10): qty 100

## 2019-10-28 MED ORDER — FENTANYL CITRATE (PF) 100 MCG/2ML IJ SOLN
25.0000 ug | INTRAMUSCULAR | Status: DC | PRN
  Administered 2019-10-28 (×3): 25 ug via INTRAVENOUS
  Filled 2019-10-28 (×3): qty 2

## 2019-10-28 MED ORDER — PANTOPRAZOLE SODIUM 40 MG IV SOLR
40.0000 mg | INTRAVENOUS | Status: DC
  Administered 2019-10-28 – 2019-10-31 (×4): 40 mg via INTRAVENOUS
  Filled 2019-10-28 (×4): qty 40

## 2019-10-28 MED ORDER — ISOSORBIDE DINITRATE 10 MG PO TABS
10.0000 mg | ORAL_TABLET | Freq: Three times a day (TID) | ORAL | Status: DC
  Administered 2019-10-28 (×3): 10 mg via ORAL
  Filled 2019-10-28 (×3): qty 1

## 2019-10-28 NOTE — Progress Notes (Addendum)
NAME:  Larry Terrell, MRN:  [id number], DOB:  Dec 02, 1960, LOS: 8 ADMISSION DATE:  10/20/2019, CONSULTATION DATE:  10/27/19 REFERRING MD:  Julien Girt, CHIEF COMPLAINT:  Chest pain   Brief History   59 year old man with history of metabolic syndrome who presented with anginal symptoms ultimately requiring CABG x 5 on 10/26/19 presenting with post-op respiratory failure.  History of present illness   59 year old man with history of metabolic syndrome who presented with anginal symptoms ultimately requiring CABG x 5 on 10/26/19 presenting with post-op respiratory failure.  He carries a diagnosis of COPD although his imaging and PFTs do not support this.    Looks like back in 2016 had trouble with extubation after appendectomy but was weaned after diuresis.  Patient extubated yesterday to BiPAP. He is saturating well, but did not tolerate removing bipap for medications.  Past Medical History  Severe CAD s/p CABG PAD s/p Aorto-bifemoral bypass DM HTN Hepatic steatosis and splenomegaly  Significant Hospital Events   4/15 CABG x 5 4/15 Extubated to BiPAP  Consults:  PCCM  Procedures:  n/a  Significant Diagnostic Tests:  CXR chest tubes in place, low lung volumes, atelectsis  Micro Data:  COVID neg MRSA PCR neg  Antimicrobials:  Vanc+cefuroxime x 1 (10/26/19)   Interim history/subjective:  Patient resting in bed with BiPAP on. He is alert and able to follow commands.   Objective   Blood pressure (!) 145/48, pulse 97, temperature 100.2 F (37.9 C), resp. rate 20, height 5' 7"  (1.702 m), weight 90.7 kg, SpO2 94 %. PAP: (43-61)/(17-33) 51/24 CO:  [4 L/min-6.6 L/min] 6.6 L/min CI:  [2 L/min/m2-3.3 L/min/m2] 3.3 L/min/m2  Vent Mode: BIPAP;PCV FiO2 (%):  [40 %-50 %] 50 % Set Rate:  [15 bmp] 15 bmp PEEP:  [8 cmH20-12 cmH20] 12 cmH20 Pressure Support:  [10 cmH20] 10 cmH20   Intake/Output Summary (Last 24 hours) at 10/28/2019 0518 Last data filed at 10/28/2019 0400 Gross per 24  hour  Intake 2344.48 ml  Output 2795 ml  Net -450.52 ml   Filed Weights   10/24/19 0600 10/25/19 0500 10/26/19 0500  Weight: 91.3 kg 90.7 kg 90.7 kg    Examination: Physical Exam  Constitutional: No distress.  HENT:  Head: Normocephalic and atraumatic.  Eyes: EOM are normal.  Cardiovascular: Normal rate and intact distal pulses.  Pulmonary/Chest: Effort normal. No respiratory distress.  On BiPAP  Abdominal: He exhibits no distension. There is no abdominal tenderness.  Musculoskeletal:        General: Edema (trace) present. Normal range of motion.     Cervical back: Normal range of motion.  Neurological: He is alert.  Skin: Skin is warm. He is diaphoretic.    Resolved Hospital Problem list   Extubated to BiPAP  Assessment & Plan:   Post-CABG hypoxemic respiratory failure  - PFT indicates restrictive pattern, with mixed etiology of obesity and volume overload.  - CXR shows bibasilar atelectasis with low lung volumes  - continue BiPAP - Continue diuretics with lasix, -1L overnight  Best practice:  Diet: per primary Pain/Anxiety/Delirium protocol (if indicated): precedex PRN VAP protocol (if indicated): in place DVT prophylaxis: per primary GI prophylaxis: PPI Glucose control: Insulin gtt Mobility: BR Code Status: full Family Communication: per primary Disposition: ICU pending vent liberation  Labs   CBC: Recent Labs  Lab 10/26/19 1458 10/26/19 1502 10/26/19 2020 10/26/19 2125 10/27/19 0328 10/27/19 0337 10/27/19 0602 10/27/19 1510 10/27/19 1815 10/28/19 0040 10/28/19 0342  WBC 18.4*  --  16.8*  --  15.8*  --   --  17.5*  --   --  12.9*  HGB 12.6*   < > 11.1*   < > 10.8*   < > 9.5* 9.8* 9.2* 10.2* 9.8*  HCT 37.0*   < > 32.7*   < > 31.5*   < > 28.0* 28.6* 27.0* 30.0* 28.9*  MCV 93.4  --  93.2  --  94.3  --   --  93.2  --   --  95.1  PLT 143*  --  142*  --  154  --   --  158  --   --  126*   < > = values in this interval not displayed.    Basic  Metabolic Panel: Recent Labs  Lab 10/26/19 0500 10/26/19 0808 10/26/19 1327 10/26/19 1330 10/26/19 2020 10/26/19 2125 10/27/19 0328 10/27/19 0337 10/27/19 0602 10/27/19 1510 10/27/19 1815 10/28/19 0040 10/28/19 0342  NA 136   < > 137   < > 136   < > 135   < > 136 136 136 133* 133*  K 4.8   < > 5.3*   < > 5.9*   < > 5.3*   < > 5.2* 4.9 4.8 5.2* 5.4*  CL 100   < > 101  --  106  --  106  --   --  105  --   --  102  CO2 24  --   --   --  23  --  22  --   --  21*  --   --  20*  GLUCOSE 192*   < > 184*  --  195*  --  167*  --   --  163*  --   --  187*  BUN 25*   < > 25*  --  25*  --  26*  --   --  29*  --   --  30*  CREATININE 1.54*   < > 1.30*  --  1.55*  --  1.84*  --   --  2.37*  --   --  2.52*  CALCIUM 9.3  --   --   --  7.3*  --  7.5*  --   --  8.0*  --   --  8.2*  MG  --   --   --   --  2.9*  --  2.7*  --   --  2.5*  --   --   --    < > = values in this interval not displayed.   GFR: Estimated Creatinine Clearance: 33 mL/min (A) (by C-G formula based on SCr of 2.52 mg/dL (H)). Recent Labs  Lab 10/26/19 2020 10/27/19 0328 10/27/19 1510 10/28/19 0342  WBC 16.8* 15.8* 17.5* 12.9*    Liver Function Tests: Recent Labs  Lab 10/28/19 0342  AST 22  ALT 20  ALKPHOS 50  BILITOT 1.4*  PROT 5.7*  ALBUMIN 3.2*   No results for input(s): LIPASE, AMYLASE in the last 168 hours. No results for input(s): AMMONIA in the last 168 hours.  ABG    Component Value Date/Time   PHART 7.343 (L) 10/28/2019 0040   PCO2ART 38.6 10/28/2019 0040   PO2ART 74.0 (L) 10/28/2019 0040   HCO3 20.9 10/28/2019 0040   TCO2 22 10/28/2019 0040   ACIDBASEDEF 4.0 (H) 10/28/2019 0040   O2SAT 94.0 10/28/2019 0040     Coagulation Profile: Recent Labs  Lab 10/26/19 1458  INR  1.4*    Cardiac Enzymes: No results for input(s): CKTOTAL, CKMB, CKMBINDEX, TROPONINI in the last 168 hours.  HbA1C: Hemoglobin A1C  Date/Time Value Ref Range Status  12/15/2013 03:43 AM 8.3 (H) 4.2 - 6.3 % Final     Comment:    The American Diabetes Association recommends that a primary goal of therapy should be <7% and that physicians should reevaluate the treatment regimen in patients with HbA1c values consistently >8%.   10/03/2013 12:11 PM 7.5 (H) 4.2 - 6.3 % Final    Comment:    The American Diabetes Association recommends that a primary goal of therapy should be <7% and that physicians should reevaluate the treatment regimen in patients with HbA1c values consistently >8%.    Hgb A1c MFr Bld  Date/Time Value Ref Range Status  10/18/2019 10:56 PM 7.3 (H) 4.8 - 5.6 % Final    Comment:    (NOTE)         Prediabetes: 5.7 - 6.4         Diabetes: >6.4         Glycemic control for adults with diabetes: <7.0   06/05/2019 04:52 AM 6.9 (H) 4.8 - 5.6 % Final    Comment:    (NOTE) Pre diabetes:          5.7%-6.4% Diabetes:              >6.4% Glycemic control for   <7.0% adults with diabetes     CBG: Recent Labs  Lab 10/27/19 1814 10/27/19 1856 10/27/19 2014 10/27/19 2349 10/28/19 0338  GLUCAP 92 120* 123* 155* 179*    Review of Systems:   Negative except what is noted on subjective  Past Medical History  He,  has a past medical history of CHF (congestive heart failure) ([place]), Cirrhosis ([place]), COPD (chronic obstructive pulmonary disease) ([place]), Depression, Diabetes mellitus with neuropathy ([place]), Heart attack ([place]), Neuropathy, OSA (obstructive sleep apnea), Peripheral vascular disease due to secondary diabetes mellitus ([place]), Splenomegaly, Temporal giant cell arteritis ([place]), and TIA (transient ischemic attack) (April 2016).   Surgical History    Past Surgical History:  Procedure Laterality Date  . AORTA - FEMORAL ARTERY BYPASS GRAFT Bilateral   . CARDIAC CATHETERIZATION    . cardiac stents  July 2015   . CORONARY ARTERY BYPASS GRAFT N/A 10/26/2019   Procedure: CORONARY ARTERY BYPASS GRAFTING (CABG) x 5, Using Bilateral mammary arteries, and right leg greater saphenous vein  harvested endoscopically;  Surgeon: Wonda Olds, MD;  Location: [facility];  Service: Open Heart Surgery;  Laterality: N/A;  . CORONARY STENT INTERVENTION N/A 08/09/2017   Procedure: CORONARY STENT INTERVENTION;  Surgeon: Wellington Hampshire, MD;  Location: [facility];  Service: Cardiovascular;  Laterality: N/A;  . LAPAROSCOPIC APPENDECTOMY N/A 07/10/2015   Procedure: APPENDECTOMY LAPAROSCOPIC;  Surgeon: Rolm Bookbinder, MD;  Location: [facility];  Service: General;  Laterality: N/A;  . LEFT HEART CATH AND CORONARY ANGIOGRAPHY N/A 08/09/2017   Procedure: LEFT HEART CATH AND CORONARY ANGIOGRAPHY;  Surgeon: Wellington Hampshire, MD;  Location: [facility];  Service: Cardiovascular;  Laterality: N/A;  . LEFT HEART CATH AND CORONARY ANGIOGRAPHY N/A 10/19/2019   Procedure: LEFT HEART CATH AND CORONARY ANGIOGRAPHY;  Surgeon: Minna Merritts, MD;  Location: [facility];  Service: Cardiovascular;  Laterality: N/A;  . RADIAL ARTERY HARVEST Left 10/26/2019   Procedure: RADIAL ARTERY HARVEST;  Surgeon: Wonda Olds, MD;  Location: [facility];  Service: Open Heart Surgery;  Laterality: Left;  . TEE WITHOUT CARDIOVERSION N/A 10/26/2019   Procedure: TRANSESOPHAGEAL ECHOCARDIOGRAM (TEE);  Surgeon: Wonda Olds, MD;  Location: [facility];  Service: Open Heart Surgery;  Laterality: N/A;     Social History   reports that he quit smoking about 12 years ago. His smoking use included cigarettes. He has never used smokeless tobacco. He reports that he does not drink alcohol or use drugs.   Family History   His family history includes Alzheimer's disease in his father; Heart attack in his father; Heart attack (age of onset: 6) in his mother; Stroke in his mother.   Allergies Allergies  Allergen Reactions  . Gabapentin   . Ace Inhibitors Cough    Other reaction(s): Other (See Comments) cough     Home Medications  Prior to Admission medications   Medication Sig Start Date End Date Taking?  Authorizing Provider  acetaminophen (TYLENOL) 500 MG tablet Take 1,000 mg by mouth daily as needed.    [provider]  amLODipine (NORVASC) 5 MG tablet Take 5 mg by mouth daily. 09/04/19 09/03/20  [provider]  aspirin 81 MG chewable tablet Chew 1 tablet (81 mg total) by mouth daily. 10/20/19   Ezekiel Slocumb, DO  atorvastatin (LIPITOR) 40 MG tablet Take 40 mg by mouth daily.     [provider]  atorvastatin (LIPITOR) 80 MG tablet Take 1 tablet (80 mg total) by mouth daily. 10/21/19   Minna Merritts, MD  ezetimibe (ZETIA) 10 MG tablet Take 1 tablet (10 mg total) by mouth daily. 10/20/19   Minna Merritts, MD  famotidine (PEPCID) 20 MG tablet Take 1 tablet (20 mg total) by mouth 2 (two) times daily. Patient taking differently: Take 20 mg by mouth daily as needed.  12/26/17   Paulette Blanch, MD  furosemide (LASIX) 10 MG/ML injection Inject 4 mLs (40 mg total) into the vein every 12 (twelve) hours. 10/20/19   Nicole Kindred A, DO  heparin 25000-0.45 UT/250ML-% infusion Inject 1,150 Units/hr into the vein continuous. 10/20/19   Nicole Kindred A, DO  insulin aspart (NOVOLOG) 100 UNIT/ML injection Inject 0-15 Units into the skin 4 (four) times daily - after meals and at bedtime. 10/20/19   Nicole Kindred A, DO  insulin aspart (NOVOLOG) 100 UNIT/ML injection Inject 10 Units into the skin 3 (three) times daily with meals. 10/20/19   Nicole Kindred A, DO  insulin glargine (LANTUS) 100 UNIT/ML injection Inject 0.05 mLs (5 Units total) into the skin daily. 10/20/19   Ezekiel Slocumb, DO  levETIRAcetam (KEPPRA) 500 MG tablet Take 500 mg by mouth 3 (three) times daily. 10/03/19   [provider]  losartan (COZAAR) 100 MG tablet Take 100 mg by mouth daily. 05/11/19   [provider]  metFORMIN (GLUCOPHAGE) 500 MG tablet Take 500 mg by mouth 2 (two) times daily with a meal.    [provider]  metoprolol succinate (TOPROL-XL) 100 MG 24 hr tablet Take 100 mg by  mouth daily. 05/11/19   [provider]  nitroGLYCERIN (NITROSTAT) 0.4 MG SL tablet Place 0.4 mg under the tongue every 5 (five) minutes as needed for chest pain.    [provider]  oxyCODONE (OXY IR/ROXICODONE) 5 MG immediate release tablet Take 5 mg by mouth every 8 (eight) hours as needed for pain. 05/18/19   [provider]  pantoprazole (PROTONIX) 40 MG tablet Take 1 tablet (40 mg total) by mouth daily. 10/20/19   Ezekiel Slocumb,  DO  pregabalin (LYRICA) 50 MG capsule Take 1 capsule (50 mg total) by mouth 3 (three) times daily. 10/20/19 10/19/20  Nicole Kindred A, DO  venlafaxine (EFFEXOR) 75 MG tablet Take 75 mg by mouth 3 (three) times daily. 05/11/19   [provider]     Critical care time:     Marianna Payment, D.O. [facility], PGY-1 Pager: [phone], Phone: [phone] Date 10/28/2019 Time 9:27 AM   PCCM attending: 59 year old gentleman taken to the operating room for CABG x5.  Had postop respiratory failure maintained on mechanical support.  Extubated to BiPAP.  Continue diuresis on vasopressors.  BP (!) 146/66   Pulse (!) 106   Temp (!) 100.6 F (38.1 C)   Resp 18   Ht 5' 7"  (1.702 m)   Wt 97.3 kg   SpO2 94%   BMI 33.60 kg/m   General: Obese male on BiPAP, critically ill HEENT: NCAT Heart: Regular rate rhythm S1-S2 Lungs: Bilateral ventilated breath sounds with BiPAP. Abdomen: Obese, soft nontender mildly distended  Labs: Reviewed serum creatinine mildly elevated 2.5 Urine output 2.7 L Chest x-ray: Still with vascular congestion  Assessment: Acute hypoxemic respiratory failure Postop CABG Requiring BiPAP support. AKI on CKD Positive cumulative fluid balance Bilateral pulmonary edema Cardiogenic shock  Plan: Continue milrinone Continue diuresis Follow urine output Avoid nephrotoxic agents Continue amiodarone Maintained on BiPAP at this time. Was on EPAP of 20 dropped to 16/8.  This patient is  critically ill with multiple organ system failure; which, requires frequent high complexity decision making, assessment, support, evaluation, and titration of therapies. This was completed through the application of advanced monitoring technologies and extensive interpretation of multiple databases. During this encounter critical care time was devoted to patient care services described in this note for 32 minutes.  Garner Nash, DO [facility] 10/28/2019 11:26 AM

## 2019-10-28 NOTE — Progress Notes (Signed)
2 Days Post-Op Procedure(s) (LRB): CORONARY ARTERY BYPASS GRAFTING (CABG) x 5, Using Bilateral mammary arteries, and right leg greater saphenous vein harvested endoscopically (N/A) RADIAL ARTERY HARVEST (Left) TRANSESOPHAGEAL ECHOCARDIOGRAM (TEE) (N/A) INDOCYANINE GREEN FLUORESCENCE IMAGING (ICG) (N/A) Subjective: No complaints Objective: Vital signs in last 24 hours: Temp:  [98.2 F (36.8 C)-100.6 F (38.1 C)] 100.6 F (38.1 C) (04/17 0735) Pulse Rate:  [68-109] 109 (04/17 0735) Cardiac Rhythm: Atrial paced (04/17 0400) Resp:  [8-27] 20 (04/17 0735) BP: (103-195)/(47-73) 159/59 (04/17 0700) SpO2:  [88 %-98 %] 93 % (04/17 0735) Arterial Line BP: (107-194)/(45-88) 153/48 (04/17 0700) FiO2 (%):  [40 %-50 %] 40 % (04/17 0735) Weight:  [97.3 kg] 97.3 kg (04/17 0630)  Hemodynamic parameters for last 24 hours: PAP: (43-61)/(17-33) 48/22 CO:  [4 L/min-6.6 L/min] 6.6 L/min CI:  [2 L/min/m2-3.3 L/min/m2] 3.3 L/min/m2  Intake/Output from previous day: 04/16 0701 - 04/17 0700 In: 2253.4 [I.V.:2053.4; IV Piggyback:200] Out: 3546 [Urine:2785; Chest Tube:470] Intake/Output this shift: No intake/output data recorded.  General appearance: alert and cooperative Neurologic: intact Heart: regular rate and rhythm, S1, S2 normal, no murmur, click, rub or gallop Lungs: clear to auscultation bilaterally Abdomen: soft, non-tender; bowel sounds normal; no masses,  no organomegaly Extremities: extremities normal, atraumatic, no cyanosis or edema Wound: dressed  Lab Results: Recent Labs    10/27/19 1510 10/27/19 1815 10/28/19 0342 10/28/19 0700  WBC 17.5*  --  12.9*  --   HGB 9.8*   < > 9.8* 8.8*  HCT 28.6*   < > 28.9* 26.0*  PLT 158  --  126*  --    < > = values in this interval not displayed.   BMET:  Recent Labs    10/27/19 1510 10/27/19 1815 10/28/19 0342 10/28/19 0700  NA 136   < > 133* 134*  K 4.9   < > 5.4* 5.3*  CL 105  --  102  --   CO2 21*  --  20*  --   GLUCOSE 163*   --  187*  --   BUN 29*  --  30*  --   CREATININE 2.37*  --  2.52*  --   CALCIUM 8.0*  --  8.2*  --    < > = values in this interval not displayed.    PT/INR:  Recent Labs    10/26/19 1458  LABPROT 16.6*  INR 1.4*   ABG    Component Value Date/Time   PHART 7.377 10/28/2019 0700   HCO3 21.6 10/28/2019 0700   TCO2 23 10/28/2019 0700   ACIDBASEDEF 3.0 (H) 10/28/2019 0700   O2SAT 95.0 10/28/2019 0700   CBG (last 3)  Recent Labs    10/27/19 2014 10/27/19 2349 10/28/19 0338  GLUCAP 123* 155* 179*    Assessment/Plan: S/P Procedure(s) (LRB): CORONARY ARTERY BYPASS GRAFTING (CABG) x 5, Using Bilateral mammary arteries, and right leg greater saphenous vein harvested endoscopically (N/A) RADIAL ARTERY HARVEST (Left) TRANSESOPHAGEAL ECHOCARDIOGRAM (TEE) (N/A) INDOCYANINE GREEN FLUORESCENCE IMAGING (ICG) (N/A) Mobilize Diuresis d/c tubes/lines  Out of bed   LOS: 8 days    Wonda Olds 10/28/2019

## 2019-10-28 NOTE — Progress Notes (Signed)
Patient requiring BiPAP, unable to do metaneb at this time.

## 2019-10-28 NOTE — Progress Notes (Signed)
Progress Note  Patient Name: Larry Terrell Date of Encounter: 10/28/2019  Primary Cardiologist:   Mertie Moores, MD   Subjective   On BiPAP.  Reports usual chest pain.  Awake and appropriate  Inpatient Medications    Scheduled Meds: . sodium chloride   Intravenous Once  . acetaminophen  1,000 mg Oral Q6H   Or  . acetaminophen (TYLENOL) oral liquid 160 mg/5 mL  1,000 mg Per Tube Q6H  . aspirin EC  325 mg Oral Daily   Or  . aspirin  324 mg Per Tube Daily  . atorvastatin  80 mg Oral q1800  . bisacodyl  10 mg Oral Daily   Or  . bisacodyl  10 mg Rectal Daily  . chlorhexidine  15 mL Mouth Rinse BID  . Chlorhexidine Gluconate Cloth  6 each Topical Daily  . docusate sodium  200 mg Oral Daily  . insulin aspart  0-24 Units Subcutaneous Q4H  . isosorbide dinitrate  10 mg Oral TID  . levalbuterol  0.63 mg Inhalation Q6H  . levETIRAcetam  500 mg Oral TID  . mouth rinse  15 mL Mouth Rinse q12n4p  . metoprolol tartrate  12.5 mg Oral BID   Or  . metoprolol tartrate  12.5 mg Per Tube BID  . omega-3 acid ethyl esters  1 g Oral BID  . pantoprazole  40 mg Oral Daily  . pregabalin  50 mg Oral TID  . sodium chloride flush  10-40 mL Intracatheter Q12H  . venlafaxine  75 mg Oral TID WC   Continuous Infusions: . sodium chloride 20 mL/hr at 10/28/19 0800  . sodium chloride    . sodium chloride 20 mL/hr at 10/26/19 1514  . amiodarone 30 mg/hr (10/28/19 0800)  . furosemide (LASIX) infusion 5 mg/hr (10/28/19 0800)  . insulin Stopped (10/27/19 2002)  . lactated ringers    . lactated ringers    . lactated ringers 20 mL/hr at 10/28/19 0800  . milrinone 0.125 mcg/kg/min (10/28/19 0807)  . nitroGLYCERIN 30 mcg/min (10/28/19 0800)   PRN Meds: sodium chloride, acetaminophen, ALPRAZolam, dextrose, hydrALAZINE, ipratropium-albuterol, lactated ringers, metoprolol tartrate, nitroGLYCERIN, ondansetron (ZOFRAN) IV, oxyCODONE, sodium chloride flush, traMADol   Vital Signs    Vitals:   10/28/19 0630 10/28/19 0700 10/28/19 0735 10/28/19 0800  BP:  (!) 159/59  (!) 171/67  Pulse:  98 (!) 109 (!) 112  Resp:  20 20 20   Temp:  (!) 100.4 F (38 C) (!) 100.6 F (38.1 C) (!) 100.6 F (38.1 C)  TempSrc:    Core  SpO2:  96% 93% 99%  Weight: 97.3 kg     Height:        Intake/Output Summary (Last 24 hours) at 10/28/2019 0838 Last data filed at 10/28/2019 0800 Gross per 24 hour  Intake 2185.28 ml  Output 3205 ml  Net -1019.72 ml   Filed Weights   10/25/19 0500 10/26/19 0500 10/28/19 0630  Weight: 90.7 kg 90.7 kg 97.3 kg    Telemetry    NSR with PACs and first degree AV block - Personally Reviewed  ECG    NA - Personally Reviewed  Physical Exam   GEN: No acute distress.   Neck: No  JVD Cardiac: RRR, no murmurs, rubs, or gallops.  Respiratory:   Decreased breath sounds bilaterally.   GI: Soft, nontender, non-distended  MS:   Mild diffuse non pitting edema; No deformity. Neuro:  [facility] Recent Labs  Lab 10/27/19  4536 10/27/19 0337 10/27/19 1510 10/27/19 1815 10/28/19 0040 10/28/19 0342 10/28/19 0700  NA 135   < > 136   < > 133* 133* 134*  K 5.3*   < > 4.9   < > 5.2* 5.4* 5.3*  CL 106  --  105  --   --  102  --   CO2 22  --  21*  --   --  20*  --   GLUCOSE 167*  --  163*  --   --  187*  --   BUN 26*  --  29*  --   --  30*  --   CREATININE 1.84*  --  2.37*  --   --  2.52*  --   CALCIUM 7.5*  --  8.0*  --   --  8.2*  --   PROT  --   --   --   --   --  5.7*  --   ALBUMIN  --   --   --   --   --  3.2*  --   AST  --   --   --   --   --  22  --   ALT  --   --   --   --   --  20  --   ALKPHOS  --   --   --   --   --  50  --   BILITOT  --   --   --   --   --  1.4*  --   GFRNONAA 39*  --  28*  --   --  26*  --   GFRAA 45*  --  33*  --   --  31*  --   ANIONGAP 7  --  10  --   --  11  --    < > = values in this interval not displayed.     Hematology Recent Labs  Lab 10/27/19 0328 10/27/19 0337 10/27/19 1510 10/27/19 1815  10/28/19 0040 10/28/19 0342 10/28/19 0700  WBC 15.8*  --  17.5*  --   --  12.9*  --   RBC 3.34*  --  3.07*  --   --  3.04*  --   HGB 10.8*   < > 9.8*   < > 10.2* 9.8* 8.8*  HCT 31.5*   < > 28.6*   < > 30.0* 28.9* 26.0*  MCV 94.3  --  93.2  --   --  95.1  --   MCH 32.3  --  31.9  --   --  32.2  --   MCHC 34.3  --  34.3  --   --  33.9  --   RDW 17.8*  --  17.9*  --   --  18.8*  --   PLT 154  --  158  --   --  126*  --    < > = values in this interval not displayed.    Cardiac EnzymesNo results for input(s): TROPONINI in the last 168 hours. No results for input(s): TROPIPOC in the last 168 hours.   BNPNo results for input(s): BNP, PROBNP in the last 168 hours.   DDimer No results for input(s): DDIMER in the last 168 hours.   Radiology    DG Chest Port 1 View  Result Date: 10/27/2019 CLINICAL DATA:  59 year old male status endotracheal tube placement. EXAM: PORTABLE CHEST 1 VIEW COMPARISON:  Chest x-ray  10/26/2019. FINDINGS: An endotracheal tube is in place with tip 7.7 cm above the carina. A nasogastric tube is seen extending into the stomach, however, the tip of the nasogastric tube extends below the lower margin of the image. Right IJ Cordis through which a Swan-Ganz catheter has been passed into the distal pulmonic trunk. Bilateral chest tubes in position, as well as mediastinal/pericardial drain. Lung volumes are low with bibasilar opacities most compatible with areas of postoperative subsegmental atelectasis. No definite consolidative airspace disease. No pleural effusions. No appreciable pneumothorax. Cardiopericardial silhouette is within normal limits given the patient's positioning. Aortic atherosclerosis. IMPRESSION: 1. Postoperative changes and support apparatus, as above. 2. Low lung volumes with probable bibasilar subsegmental atelectasis, slightly worsened compared to the prior study. 3. Aortic atherosclerosis. Electronically Signed   By: Vinnie Langton M.D.   On: 10/27/2019  08:18   DG Chest Port 1 View  Result Date: 10/26/2019 CLINICAL DATA:  Status post coronary bypass graft EXAM: PORTABLE CHEST 1 VIEW COMPARISON:  10/18/2019 FINDINGS: Cardiac shadow is stable. Postsurgical changes are now seen. Bilateral thoracostomy catheters and mediastinal drain are noted. Endotracheal tube and gastric catheter are noted in satisfactory position. Swan-Ganz catheter is noted in the pulmonary outflow tract. The lungs are well aerated without pneumothorax. Mild bibasilar atelectasis is seen right greater than left. Small right pleural effusion is noted as well. IMPRESSION: Postsurgical changes with tubes and lines as described above. Mild bibasilar atelectasis and right-sided pleural effusion. Electronically Signed   By: Inez Catalina M.D.   On: 10/26/2019 15:22    Cardiac Studies   Echo:    1. Left ventricular ejection fraction, by estimation, is 55%. The left  ventricle has low normal function. Grossly, no significant wall motion  abnormality. There is mild left ventricular hypertrophy of the septal  segment. Grade II diastolic dysfunction.  2. Right ventricular systolic function is normal. The right ventricular  size is normal. Tricuspid regurgitation signal is inadequate for assessing  PA pressure.  3. Left atrial size was moderately dilated.   Patient Profile     59 y.o. male hx of CAD s/p RCA and LCx stenting,chronic diastolic heart failure, PAD s/paortobifemoral bypass in 2003 with known stenosis of the bilateral SFAs,TIA,DM2 with diabetic polyneuropathy, HTN, HLD,seizure disorder, chronic back pain,and prior tobacco usewhowas [facility] with left shoulder pain concerning for anginal equivalent and underwent diagnostic LHC which revealed severe multi-vessel CAD with recommendation for transfer to [facility] for evaluation of CABG.Now status post CABG.   Assessment & Plan    CABG:  Requiring BiPAP post extubation.  CXR today with some edema and  decreased inspiratory effort.  Continuing diuresis today given poor oxygenation.     CHRONIC DIASTOLIC HF:  Minus 1 liter last 24 hours but with increasing creat.  CO is 7.4 and weaning milrinone.    DYSLIPIDEMIA:  On Lipitor. Restart Zetia at discharge. Was also going to be started on Vascepa.     CKD:  Creat is increasing.    Likely some element of ATN with hopeful turn in the other direction soon.  Avoiding nephrotoxins  ATRIAL FIB:  Maintaining NSR.  Change to PO amio in the morning.    For questions or updates, please contact [place] Please consult [email or web address] for contact info under Cardiology/STEMI.   Signed, Minus Breeding, MD  10/28/2019, 8:38 AM

## 2019-10-28 NOTE — Progress Notes (Signed)
Patient on BIPAP unable to do meta neb

## 2019-10-28 NOTE — Progress Notes (Addendum)
Pt was given medication PO and was on 6L [place] at the time of administration. Pt has take about 1 hour to recover on BIPAP. Pt was given two PRNs for blood pressure, see MAR.

## 2019-10-28 NOTE — Plan of Care (Signed)

## 2019-10-28 NOTE — Progress Notes (Signed)
CT surgery p.m. Rounds  Patient diuresing well on Lasix drip Oxygenation slowly improving but still requires BiPAP Set up in chair 3 hours today Maintaining sinus rhythm P.m. labs-creatinine stable at 2.4 potassium 4.6, hemoglobin 8.8

## 2019-10-28 NOTE — Progress Notes (Signed)
Patient on BiPAP unable to do metaneb at this time.

## 2019-10-29 ENCOUNTER — Inpatient Hospital Stay (HOSPITAL_COMMUNITY): Payer: Medicare HMO

## 2019-10-29 ENCOUNTER — Inpatient Hospital Stay: Payer: Self-pay

## 2019-10-29 DIAGNOSIS — N189 Chronic kidney disease, unspecified: Secondary | ICD-10-CM

## 2019-10-29 DIAGNOSIS — N179 Acute kidney failure, unspecified: Secondary | ICD-10-CM

## 2019-10-29 LAB — COMPREHENSIVE METABOLIC PANEL
ALT: 20 U/L (ref 0–44)
AST: 18 U/L (ref 15–41)
Albumin: 2.9 g/dL — ABNORMAL LOW (ref 3.5–5.0)
Alkaline Phosphatase: 53 U/L (ref 38–126)
Anion gap: 13 (ref 5–15)
BUN: 35 mg/dL — ABNORMAL HIGH (ref 8–23)
CO2: 23 mmol/L (ref 22–32)
Calcium: 8.5 mg/dL — ABNORMAL LOW (ref 8.9–10.3)
Chloride: 98 mmol/L (ref 98–111)
Creatinine, Ser: 2.29 mg/dL — ABNORMAL HIGH (ref 0.61–1.24)
GFR calc Af Amer: 34 mL/min — ABNORMAL LOW (ref >60)
GFR calc non Af Amer: 30 mL/min — ABNORMAL LOW (ref >60)
Glucose, Bld: 263 mg/dL — ABNORMAL HIGH (ref 70–99)
Potassium: 4.1 mmol/L (ref 3.5–5.1)
Sodium: 134 mmol/L — ABNORMAL LOW (ref 135–145)
Total Bilirubin: 1.1 mg/dL (ref 0.3–1.2)
Total Protein: 5.9 g/dL — ABNORMAL LOW (ref 6.5–8.1)

## 2019-10-29 LAB — BASIC METABOLIC PANEL
Anion gap: 13 (ref 5–15)
BUN: 36 mg/dL — ABNORMAL HIGH (ref 8–23)
CO2: 27 mmol/L (ref 22–32)
Calcium: 8.6 mg/dL — ABNORMAL LOW (ref 8.9–10.3)
Chloride: 95 mmol/L — ABNORMAL LOW (ref 98–111)
Creatinine, Ser: 2.03 mg/dL — ABNORMAL HIGH (ref 0.61–1.24)
GFR calc Af Amer: 40 mL/min — ABNORMAL LOW (ref >60)
GFR calc non Af Amer: 34 mL/min — ABNORMAL LOW (ref >60)
Glucose, Bld: 190 mg/dL — ABNORMAL HIGH (ref 70–99)
Potassium: 3.3 mmol/L — ABNORMAL LOW (ref 3.5–5.1)
Sodium: 135 mmol/L (ref 135–145)

## 2019-10-29 LAB — TYPE AND SCREEN
ABO/RH(D): B POS
Antibody Screen: NEGATIVE
Unit division: 0
Unit division: 0
Unit division: 0
Unit division: 0

## 2019-10-29 LAB — GLUCOSE, CAPILLARY
Glucose-Capillary: 260 mg/dL — ABNORMAL HIGH (ref 70–99)
Glucose-Capillary: 249 mg/dL — ABNORMAL HIGH (ref 70–99)
Glucose-Capillary: 205 mg/dL — ABNORMAL HIGH (ref 70–99)
Glucose-Capillary: 175 mg/dL — ABNORMAL HIGH (ref 70–99)
Glucose-Capillary: 190 mg/dL — ABNORMAL HIGH (ref 70–99)
Glucose-Capillary: 185 mg/dL — ABNORMAL HIGH (ref 70–99)

## 2019-10-29 LAB — BPAM RBC
Blood Product Expiration Date: 202105102359
Blood Product Expiration Date: 202105102359
Blood Product Expiration Date: 202105132359
Blood Product Expiration Date: 202105132359
ISSUE DATE / TIME: 202104151135
ISSUE DATE / TIME: 202104151135
Unit Type and Rh: 7300
Unit Type and Rh: 7300
Unit Type and Rh: 7300
Unit Type and Rh: 7300

## 2019-10-29 LAB — CBC
HCT: 29.5 % — ABNORMAL LOW (ref 39.0–52.0)
Hemoglobin: 9.8 g/dL — ABNORMAL LOW (ref 13.0–17.0)
MCH: 31.7 pg (ref 26.0–34.0)
MCHC: 33.2 g/dL (ref 30.0–36.0)
MCV: 95.5 fL (ref 80.0–100.0)
Platelets: 128 10*3/uL — ABNORMAL LOW (ref 150–400)
RBC: 3.09 MIL/uL — ABNORMAL LOW (ref 4.22–5.81)
RDW: 19.1 % — ABNORMAL HIGH (ref 11.5–15.5)
WBC: 14.7 10*3/uL — ABNORMAL HIGH (ref 4.0–10.5)
nRBC: 0 % (ref 0.0–0.2)

## 2019-10-29 MED ORDER — SODIUM CHLORIDE 0.9% FLUSH
10.0000 mL | Freq: Two times a day (BID) | INTRAVENOUS | Status: DC
  Administered 2019-10-29 – 2019-10-31 (×3): 10 mL

## 2019-10-29 MED ORDER — AMIODARONE IV BOLUS ONLY 150 MG/100ML: 150.0000 mg | Freq: Once | INTRAVENOUS | Status: DC

## 2019-10-29 MED ORDER — METOLAZONE 5 MG PO TABS
5.0000 mg | ORAL_TABLET | Freq: Once | ORAL | Status: DC
  Filled 2019-10-29 (×2): qty 1

## 2019-10-29 MED ORDER — POTASSIUM CHLORIDE 10 MEQ/50ML IV SOLN
10.0000 meq | INTRAVENOUS | Status: AC
  Administered 2019-10-29 (×3): 10 meq via INTRAVENOUS
  Filled 2019-10-29 (×3): qty 50

## 2019-10-29 MED ORDER — SODIUM CHLORIDE 0.9 % IV SOLN
1.0000 g | INTRAVENOUS | Status: DC
  Administered 2019-10-29 – 2019-10-30 (×2): 1 g via INTRAVENOUS
  Filled 2019-10-29: qty 10
  Filled 2019-10-29 (×2): qty 1

## 2019-10-29 MED ORDER — SODIUM CHLORIDE 0.9% FLUSH: 10.0000 mL | INTRAVENOUS | Status: DC | PRN

## 2019-10-29 MED ORDER — LEVALBUTEROL HCL 0.63 MG/3ML IN NEBU
0.6300 mg | INHALATION_SOLUTION | Freq: Four times a day (QID) | RESPIRATORY_TRACT | Status: DC
  Administered 2019-10-29 (×4): 0.63 mg via RESPIRATORY_TRACT
  Filled 2019-10-29 (×5): qty 3

## 2019-10-29 MED ORDER — AMIODARONE LOAD VIA INFUSION
150.0000 mg | Freq: Once | INTRAVENOUS | Status: AC
  Administered 2019-10-29: 150 mg via INTRAVENOUS
  Filled 2019-10-29: qty 83.34

## 2019-10-29 MED ORDER — INSULIN GLARGINE 100 UNIT/ML ~~LOC~~ SOLN
10.0000 [IU] | Freq: Two times a day (BID) | SUBCUTANEOUS | Status: DC
  Administered 2019-10-29 – 2019-11-06 (×17): 10 [IU] via SUBCUTANEOUS
  Filled 2019-10-29 (×18): qty 0.1

## 2019-10-29 NOTE — Progress Notes (Signed)
3 Days Post-Op Procedure(s) (LRB): CORONARY ARTERY BYPASS GRAFTING (CABG) x 5, Using Bilateral mammary arteries, and right leg greater saphenous vein harvested endoscopically (N/A) RADIAL ARTERY HARVEST (Left) TRANSESOPHAGEAL ECHOCARDIOGRAM (TEE) (N/A) INDOCYANINE GREEN FLUORESCENCE IMAGING (ICG) (N/A) Subjective: Pulmonary status and BiPAP dependency are improving with diuresis on Lasix drip Maintaining sinus rhythm Out of bed to chair with assistance Need swallow study and physical therapy Objective: Vital signs in last 24 hours: Temp:  [98.8 F (37.1 C)-100.4 F (38 C)] 100.4 F (38 C) (04/18 1134) Pulse Rate:  [84-110] 107 (04/18 1230) Cardiac Rhythm: Sinus tachycardia (04/18 1200) Resp:  [15-25] 25 (04/18 1230) BP: (138-183)/(57-110) 182/63 (04/18 1230) SpO2:  [84 %-100 %] 93 % (04/18 1230) Arterial Line BP: (126-173)/(62-87) 126/87 (04/17 1600) FiO2 (%):  [35 %-60 %] 40 % (04/18 0732) Weight:  [97 kg] 97 kg (04/18 0500)  Hemodynamic parameters for last 24 hours:  Sinus rhythm  Intake/Output from previous day: 04/17 0701 - 04/18 0700 In: 2351.5 [P.O.:100; I.V.:2085.2; IV Piggyback:166.3] Out: 8341 [Urine:3025; Chest Tube:50] Intake/Output this shift: Total I/O In: 1574.1 [I.V.:240.1; IV Piggyback:1334.1] Out: 1605 [Urine:1605]  Patient responsive, appropriate Lungs clear Abdomen soft Extremities warm No cardiac murmur  Lab Results: Recent Labs    10/28/19 0342 10/28/19 0700 10/28/19 1347 10/29/19 0321  WBC 12.9*  --   --  14.7*  HGB 9.8*   < > 8.8* 9.8*  HCT 28.9*   < > 26.0* 29.5*  PLT 126*  --   --  128*   < > = values in this interval not displayed.   BMET:  Recent Labs    10/28/19 1627 10/29/19 0321  NA 135 134*  K 4.6 4.1  CL 101 98  CO2 21* 23  GLUCOSE 235* 263*  BUN 33* 35*  CREATININE 2.45* 2.29*  CALCIUM 8.2* 8.5*    PT/INR:  Recent Labs    10/26/19 1458  LABPROT 16.6*  INR 1.4*   ABG    Component Value Date/Time   PHART  7.399 10/28/2019 1347   HCO3 23.6 10/28/2019 1347   TCO2 25 10/28/2019 1347   ACIDBASEDEF 2.0 10/28/2019 1347   O2SAT 96.0 10/28/2019 1347   CBG (last 3)  Recent Labs    10/29/19 0436 10/29/19 0738 10/29/19 1132  GLUCAP 260* 249* 205*    Assessment/Plan: S/P Procedure(s) (LRB): CORONARY ARTERY BYPASS GRAFTING (CABG) x 5, Using Bilateral mammary arteries, and right leg greater saphenous vein harvested endoscopically (N/A) RADIAL ARTERY HARVEST (Left) TRANSESOPHAGEAL ECHOCARDIOGRAM (TEE) (N/A) INDOCYANINE GREEN FLUORESCENCE IMAGING (ICG) (N/A) Mobilize Diuresis Swallow assessment with concern for aspiration   LOS: 9 days    Larry Terrell 10/29/2019

## 2019-10-29 NOTE — Progress Notes (Signed)
Metaneb held at this time. Patient is on NIV resting comfortably PC5/5 40%. Moved FiO2 support from 30 to 40% to improve oxygenation.

## 2019-10-29 NOTE — Progress Notes (Signed)
PT Cancellation Note  Patient Details Name: Reginald Weida MRN: [id number] DOB: 08-19-1960   Cancelled Treatment:    Reason Eval/Treat Not Completed: (P) Medical issues which prohibited therapy Pt weaning off BiPAP. PT will follow back in a few hours.   Sheliah Fiorillo B. Migdalia Dk PT, DPT Acute Rehabilitation Services Pager [phone] Office [phone]    [place] 10/29/2019, 9:32 AM

## 2019-10-29 NOTE — Progress Notes (Addendum)
NAME:  Larry Larry, MRN:  [id number], DOB:  [birth year], LOS: 9 ADMISSION DATE:  10/20/2019, CONSULTATION DATE:  10/27/19 REFERRING MD:  Julien Girt, CHIEF COMPLAINT:  Chest pain   Brief History   59 year old man with history of metabolic syndrome who presented with anginal symptoms ultimately requiring CABG x 5 on 10/26/19 presenting with post-op respiratory failure.  Patient extubated 4/16 to BiPAP. He is saturating well, but did not tolerate removing bipap for medications.  Past Medical History  Severe CAD s/p CABG PAD s/p Aorto-bifemoral bypass DM HTN Hepatic steatosis and splenomegaly  Significant Hospital Events   4/15 CABG x 5 4/16 Extubated to BiPAP 4/18 weaning BiPAP settings 5/5   Consults:  PCCM  Procedures:  n/a  Significant Diagnostic Tests:  CXR chest tubes in place, low lung volumes, atelectsis CXR 4/18>some interval increase in lung volumes on L, however persistent congestion  Micro Data:  COVID neg MRSA PCR neg  Antimicrobials:  Vanc+cefuroxime x 1 (10/26/19)   Interim history/subjective:  NAEO Patient remains on BiPAP. Settings weaned 4/18 AM and patient tolerating well   Objective   Blood pressure (!) 142/67, pulse 97, temperature 99.5 F (37.5 C), temperature source Axillary, resp. rate 16, height 5' 7"  (1.702 m), weight 97 kg, SpO2 100 %. PAP: (46-47)/(22-24) 46/22  Vent Mode: BIPAP FiO2 (%):  [35 %-60 %] 40 % Set Rate:  [8 bmp] 8 bmp PEEP:  [7 cmH20] 7 cmH20 Plateau Pressure:  [9 cmH20] 9 cmH20   Intake/Output Summary (Last 24 hours) at 10/29/2019 0825 Last data filed at 10/29/2019 0700 Gross per 24 hour  Intake 2277.03 ml  Output 2890 ml  Net -612.97 ml   Filed Weights   10/26/19 0500 10/28/19 0630 10/29/19 0500  Weight: 90.7 kg 97.3 kg 97 kg    Examination:  General: Middle aged obese M, reclined in bed in NAD on BiPAP  Neuro: AA Following commands. PERRLA  HEENT: NCAT. BiPAP in place with good seal. Trachea midline. Anicteric sclera.   Pulm: Symmetrical chest expansion, no accessory muscle use on BiPAP 5/5. No adventitious sounds.  CV: RRR s1s2. Cap refill < 3 seconds  GI: Round, mildly distended. Non-tender. + bowel sounds  GU: defer  Extremities: Non-pitting dependent BUE edema. No cyanosis or clubbing  Skin: c/d/w. Midline sternotomy dressing c/d/i  Psych: Calm, no anxious psychomotor activity   Resolved Hospital Problem list   Extubated to BiPAP  Assessment & Plan:   Post operative hypoxemic respiratory failure  - PFT indicates restrictive pattern, with mixed etiology of obesity and volume overload.  - CXR with persistent edema, congestion. Likely component of atelectasis  P - Continue BiPAP, weaning settings as tolerated. If able to dc BiPAP, IS/Flutter - Mobilize, up to chair  - Continue diuresis - AM CXR   S/p CABG Cardiogenic shock P -Post-op per CVTS -ICU monitoring  -milrinone, weaning  Chronic diastolic HF P -on IV NTG, PRN BB   AKI on CKD, improving  -Cr down-trending P -Trend renal indices, UOP -minimize nephrotoxic agents  -Continuing diuresis as above   Atrial Fibrillation -currently NSR/ Sinus Tach P -Continue IV Amio -Cardiac monitoring  Leukocytosis  -variable WBC, variable Temp P -Will check CBC 4/19  -Trend WBC, temp. APAP for fever  DM with Hyperglycemia P -rSSI, Lantus    Best practice:  Diet: NPO on BiPAP Pain/Anxiety/Delirium protocol (if indicated): PRN oxycodone, ultram, fentanyl  VAP protocol (if indicated): na DVT prophylaxis: per primary GI prophylaxis: PPI Glucose control: Resistant  SSI + Basal Mobility: BR Code Status: full Family Communication: per primary Disposition: ICU   Labs   CBC: Recent Labs  Lab 10/26/19 2020 10/26/19 2125 10/27/19 0328 10/27/19 0337 10/27/19 1510 10/27/19 1815 10/28/19 0040 10/28/19 0342 10/28/19 0700 10/28/19 1347 10/29/19 0321  WBC 16.8*  --  15.8*  --  17.5*  --   --  12.9*  --   --  14.7*  HGB 11.1*    < > 10.8*   < > 9.8*   < > 10.2* 9.8* 8.8* 8.8* 9.8*  HCT 32.7*   < > 31.5*   < > 28.6*   < > 30.0* 28.9* 26.0* 26.0* 29.5*  MCV 93.2  --  94.3  --  93.2  --   --  95.1  --   --  95.5  PLT 142*  --  154  --  158  --   --  126*  --   --  128*   < > = values in this interval not displayed.    Basic Metabolic Panel: Recent Labs  Lab 10/26/19 2020 10/26/19 2125 10/27/19 0328 10/27/19 0337 10/27/19 1510 10/27/19 1815 10/28/19 0342 10/28/19 0700 10/28/19 1347 10/28/19 1627 10/29/19 0321  NA 136   < > 135   < > 136   < > 133* 134* 134* 135 134*  K 5.9*   < > 5.3*   < > 4.9   < > 5.4* 5.3* 4.9 4.6 4.1  CL 106   < > 106  --  105  --  102  --   --  101 98  CO2 23   < > 22  --  21*  --  20*  --   --  21* 23  GLUCOSE 195*   < > 167*  --  163*  --  187*  --   --  235* 263*  BUN 25*   < > 26*  --  29*  --  30*  --   --  33* 35*  CREATININE 1.55*   < > 1.84*  --  2.37*  --  2.52*  --   --  2.45* 2.29*  CALCIUM 7.3*   < > 7.5*  --  8.0*  --  8.2*  --   --  8.2* 8.5*  MG 2.9*  --  2.7*  --  2.5*  --   --   --   --   --   --   PHOS  --   --   --   --   --   --   --   --   --  4.8*  --    < > = values in this interval not displayed.   GFR: Estimated Creatinine Clearance: 37.6 mL/min (A) (by C-G formula based on SCr of 2.29 mg/dL (H)). Recent Labs  Lab 10/27/19 0328 10/27/19 1510 10/28/19 0342 10/29/19 0321  WBC 15.8* 17.5* 12.9* 14.7*    Liver Function Tests: Recent Labs  Lab 10/28/19 0342 10/28/19 1627 10/29/19 0321  AST 22  --  18  ALT 20  --  20  ALKPHOS 50  --  53  BILITOT 1.4*  --  1.1  PROT 5.7*  --  5.9*  ALBUMIN 3.2* 3.0* 2.9*   No results for input(s): LIPASE, AMYLASE in the last 168 hours. No results for input(s): AMMONIA in the last 168 hours.  ABG    Component Value Date/Time   PHART 7.399 10/28/2019 1347  PCO2ART 36.6 10/28/2019 1347   PO2ART 66.0 (L) 10/28/2019 1347   HCO3 23.6 10/28/2019 1347   TCO2 25 10/28/2019 1347   ACIDBASEDEF 2.0 10/28/2019 1347     O2SAT 96.0 10/28/2019 1347     Coagulation Profile: Recent Labs  Lab 10/26/19 1458  INR 1.4*    Cardiac Enzymes: No results for input(s): CKTOTAL, CKMB, CKMBINDEX, TROPONINI in the last 168 hours.  HbA1C: Hemoglobin A1C  Date/Time Value Ref Range Status  12/15/2013 03:43 AM 8.3 (H) 4.2 - 6.3 % Final    Comment:    The American Diabetes Association recommends that a primary goal of therapy should be <7% and that physicians should reevaluate the treatment regimen in patients with HbA1c values consistently >8%.   10/03/2013 12:11 PM 7.5 (H) 4.2 - 6.3 % Final    Comment:    The American Diabetes Association recommends that a primary goal of therapy should be <7% and that physicians should reevaluate the treatment regimen in patients with HbA1c values consistently >8%.    Hgb A1c MFr Bld  Date/Time Value Ref Range Status  10/18/2019 10:56 PM 7.3 (H) 4.8 - 5.6 % Final    Comment:    (NOTE)         Prediabetes: 5.7 - 6.4         Diabetes: >6.4         Glycemic control for adults with diabetes: <7.0   06/05/2019 04:52 AM 6.9 (H) 4.8 - 5.6 % Final    Comment:    (NOTE) Pre diabetes:          5.7%-6.4% Diabetes:              >6.4% Glycemic control for   <7.0% adults with diabetes     CBG: Recent Labs  Lab 10/28/19 1606 10/28/19 2008 10/28/19 2321 10/29/19 0436 10/29/19 0738  GLUCAP 218* 203* 239* 260* 249*    CRITICAL CARE Performed by: Cristal Generous   Total critical care time: 35 minutes  Critical care time was exclusive of separately billable procedures and treating other patients. Critical care was necessary to treat or prevent imminent or life-threatening deterioration.  Critical care was time spent personally by me on the following activities: development of treatment plan with patient and/or surrogate as well as nursing, discussions with consultants, evaluation of patient's response to treatment, examination of patient, obtaining history from  patient or surrogate, ordering and performing treatments and interventions, ordering and review of laboratory studies, ordering and review of radiographic studies, pulse oximetry and re-evaluation of patient's condition.  Eliseo Gum MSN, AGACNP-BC [place] [phone] If no answer, [phone] 10/29/2019, 9:55 AM    PCCM attending:  59 year old gentleman postop CABG x5 developed respiratory failure on mechanical support with BiPAP.  Continue diuresis and vasopressors per CT surgery.  BP (!) 151/84    Pulse (!) 104    Temp 99.5 F (37.5 C) (Axillary)    Resp (!) 23    Ht 5' 7"  (1.702 m)    Wt 97 kg    SpO2 98%    BMI 33.49 kg/m   General: Obese male on BiPAP HEENT: NCAT tracking appropriately Heart: Regular rhythm S1-S2 Lungs: Bilateral ventilated breath sounds on BiPAP Abdomen: Obese, soft nontender, ND   Labs: reviewed CXR: reviewed   A:  AHRF, BIPAP setting improving, dropped EPAP again this AM Post-op CABG AKI on CKD  Positive CFB  Cardiogenic shock, resolving   P :  Continue milrinone + diruesis  Follow UOP  Avoid nephrotoxic agents  Continue amiodarone  Weaning BIPAP setting  Dropped EPAP to 5  This patient is critically ill with multiple organ system failure; which, requires frequent high complexity decision making, assessment, support, evaluation, and titration of therapies. This was completed through the application of advanced monitoring technologies and extensive interpretation of multiple databases. During this encounter critical care time was devoted to patient care services described in this note for 32 minutes.  Garner Nash, DO [facility] 10/29/2019 11:19 AM

## 2019-10-29 NOTE — Progress Notes (Signed)
Progress Note  Patient Name: Larry Terrell Date of Encounter: 10/29/2019  Primary Cardiologist:   Mertie Moores, MD   Subjective   Still requiring BiPAP.  Answers questions  Inpatient Medications    Scheduled Meds: . sodium chloride   Intravenous Once  . acetaminophen  1,000 mg Oral Q6H   Or  . acetaminophen (TYLENOL) oral liquid 160 mg/5 mL  1,000 mg Per Tube Q6H  . aspirin  300 mg Rectal Daily  . atorvastatin  80 mg Oral q1800  . bisacodyl  10 mg Oral Daily   Or  . bisacodyl  10 mg Rectal Daily  . chlorhexidine  15 mL Mouth Rinse BID  . Chlorhexidine Gluconate Cloth  6 each Topical Daily  . docusate sodium  200 mg Oral Daily  . insulin aspart  0-24 Units Subcutaneous Q4H  . isosorbide dinitrate  10 mg Oral TID  . levalbuterol  0.63 mg Nebulization Q6H  . mouth rinse  15 mL Mouth Rinse q12n4p  . metoprolol tartrate  12.5 mg Oral BID   Or  . metoprolol tartrate  12.5 mg Per Tube BID  . omega-3 acid ethyl esters  1 g Oral BID  . pantoprazole (PROTONIX) IV  40 mg Intravenous Q24H  . pregabalin  50 mg Oral TID  . sodium chloride flush  10-40 mL Intracatheter Q12H  . venlafaxine  75 mg Oral TID WC   Continuous Infusions: . sodium chloride Stopped (10/28/19 0953)  . sodium chloride    . sodium chloride 20 mL/hr at 10/26/19 1514  . amiodarone 30 mg/hr (10/28/19 2302)  . furosemide (LASIX) infusion 8 mg/hr (10/29/19 0124)  . lactated ringers    . lactated ringers    . lactated ringers 20 mL/hr at 10/28/19 1900  . levETIRAcetam 500 mg (10/29/19 0500)  . milrinone 0.125 mcg/kg/min (10/28/19 1900)  . nitroGLYCERIN 30 mcg/min (10/29/19 8315)   PRN Meds: sodium chloride, acetaminophen, ALPRAZolam, dextrose, fentaNYL (SUBLIMAZE) injection, hydrALAZINE, ipratropium-albuterol, lactated ringers, metoprolol tartrate, nitroGLYCERIN, ondansetron (ZOFRAN) IV, oxyCODONE, sodium chloride flush, traMADol   Vital Signs    Vitals:   10/29/19 0740 10/29/19 0745 10/29/19 0800  10/29/19 0815  BP:  (!) 171/63 (!) 154/66 (!) 168/67  Pulse:  100 99 (!) 101  Resp:  20 17 20   Temp: 99.5 F (37.5 C)     TempSrc: Axillary     SpO2:  98% 98% 100%  Weight:      Height:        Intake/Output Summary (Last 24 hours) at 10/29/2019 0832 Last data filed at 10/29/2019 0700 Gross per 24 hour  Intake 2277.03 ml  Output 2890 ml  Net -612.97 ml   Filed Weights   10/26/19 0500 10/28/19 0630 10/29/19 0500  Weight: 90.7 kg 97.3 kg 97 kg    Telemetry    NSR - Personally Reviewed  ECG    NSR, rate 103, axis WNL, diffuse ST elevation consistent with pericarditis. No myocardial injury pattern. - Personally Reviewed  Physical Exam   GEN:   No acute distress on BiPAP.   Neck: No  JVD Cardiac: RRR, no murmurs, positive rubs, or gallops.  Respiratory:   Decreased breath sounds GI: Soft, nontender, non-distended, normal bowel sounds  MS:   No edema; No deformity. Neuro:   Nonfocal   Labs    Chemistry Recent Labs  Lab 10/28/19 0342 10/28/19 0700 10/28/19 1347 10/28/19 1627 10/29/19 0321  NA 133*   < > 134* 135 134*  K 5.4*   < >  4.9 4.6 4.1  CL 102  --   --  101 98  CO2 20*  --   --  21* 23  GLUCOSE 187*  --   --  235* 263*  BUN 30*  --   --  33* 35*  CREATININE 2.52*  --   --  2.45* 2.29*  CALCIUM 8.2*  --   --  8.2* 8.5*  PROT 5.7*  --   --   --  5.9*  ALBUMIN 3.2*  --   --  3.0* 2.9*  AST 22  --   --   --  18  ALT 20  --   --   --  20  ALKPHOS 50  --   --   --  53  BILITOT 1.4*  --   --   --  1.1  GFRNONAA 26*  --   --  27* 30*  GFRAA 31*  --   --  32* 34*  ANIONGAP 11  --   --  13 13   < > = values in this interval not displayed.     Hematology Recent Labs  Lab 10/27/19 1510 10/27/19 1815 10/28/19 0342 10/28/19 0342 10/28/19 0700 10/28/19 1347 10/29/19 0321  WBC 17.5*  --  12.9*  --   --   --  14.7*  RBC 3.07*  --  3.04*  --   --   --  3.09*  HGB 9.8*   < > 9.8*   < > 8.8* 8.8* 9.8*  HCT 28.6*   < > 28.9*   < > 26.0* 26.0* 29.5*  MCV  93.2  --  95.1  --   --   --  95.5  MCH 31.9  --  32.2  --   --   --  31.7  MCHC 34.3  --  33.9  --   --   --  33.2  RDW 17.9*  --  18.8*  --   --   --  19.1*  PLT 158  --  126*  --   --   --  128*   < > = values in this interval not displayed.    Cardiac EnzymesNo results for input(s): TROPONINI in the last 168 hours. No results for input(s): TROPIPOC in the last 168 hours.   BNPNo results for input(s): BNP, PROBNP in the last 168 hours.   DDimer No results for input(s): DDIMER in the last 168 hours.   Radiology    DG Chest Port 1 View  Result Date: 10/28/2019 CLINICAL DATA:  Status post CABG on 10/26/2019 EXAM: PORTABLE CHEST 1 VIEW COMPARISON:  10/27/2019 FINDINGS: nasogastric tube and endotracheal tube have been removed. Right IJ Swan-Ganz catheter tip at pulmonary outflow tract. Bilateral chest tubes are unchanged in position. Patient rotated left. Cardiomegaly accentuated by AP portable technique. Small bilateral pleural effusions are new or increased. No pneumothorax. Lung volumes are diminished. This accentuates mild interstitial edema. Progressive left greater than right base airspace disease. Mediastinal drain. IMPRESSION: Extubation with overall worsened aeration secondary to diminished lung volumes, bibasilar airspace atelectasis. Cardiomegaly and congestive heart failure remain with layering bilateral pleural effusions. Electronically Signed   By: Abigail Miyamoto M.D.   On: 10/28/2019 10:19    Cardiac Studies   Echo:    1. Left ventricular ejection fraction, by estimation, is 55%. The left  ventricle has low normal function. Grossly, no significant wall motion  abnormality. There is mild left ventricular hypertrophy of the septal  segment. Grade II diastolic dysfunction.  2. Right ventricular systolic function is normal. The right ventricular  size is normal. Tricuspid regurgitation signal is inadequate for assessing  PA pressure.  3. Left atrial size was moderately  dilated.   Patient Profile     59 y.o. male hx of CAD s/p RCA and LCx stenting,chronic diastolic heart failure, PAD s/paortobifemoral bypass in 2003 with known stenosis of the bilateral SFAs,TIA,DM2 with diabetic polyneuropathy, HTN, HLD,seizure disorder, chronic back pain,and prior tobacco usewhowas evaluated [facility] with left shoulder pain concerning for anginal equivalent and underwent diagnostic LHC which revealed severe multi-vessel CAD with recommendation for transfer to [facility] for evaluation of CABG.Now status post CABG.   Assessment & Plan    CABG:  Requiring BiPAP post extubation.  Intake and output incomplete.  Nursing reports excellent urine output.    CXR today continued edema.  Continue IV diuresis.  Weaning milrinone.  CHRONIC DIASTOLIC HF:  BP is elevated but not taking POs well.  On IV NTG.   Using PRN beta blocker.    DYSLIPIDEMIA:  On Lipitor. Restart Zetia at discharge. Was also going to be started on Vascepa.     CKD:  Creat is trending down.  Likely will continue to recover to previous baseline.    ATRIAL FIB:  Maintaining NSR.   Not taking POs well so continue IV amiodarone.   For questions or updates, please contact [place] Please consult [email or web address] for contact info under Cardiology/STEMI.   Signed, Minus Breeding, MD  10/29/2019, 8:32 AM

## 2019-10-29 NOTE — Progress Notes (Signed)
SLP Cancellation Note  Patient Details Name: Larry Terrell MRN: [id number] DOB: April 07, 1961   Cancelled treatment:     Attempted Swallow evaluation. Pt unable participate due to lethargy. Will re-attempt at next available time.     Wynelle Bourgeois., MA CCC-SLP 10/29/2019, 11:58 AM

## 2019-10-29 NOTE — Progress Notes (Signed)
CT surgery p.m. Rounds  Patient was off BiPAP most of the day Sat on side of bed with physical therapy Removing volume with Lasix drip to improve oxygenation Developed atrial fibrillation this evening with potassium 3.3-will supplement with IV potassium and repeat amiodarone 150 mg load, continue infusion Patient with very wet/weak cough-NTS of thick mucoid sputum sent for culture.  Patient placed on antibiotics cefuroxime

## 2019-10-29 NOTE — Progress Notes (Signed)
Metaneb not done patient on BIPAP.

## 2019-10-29 NOTE — Progress Notes (Signed)
Patient requiring BiPAP, unable to do metaneb at this time.

## 2019-10-29 NOTE — Evaluation (Signed)
Physical Therapy Evaluation Patient Details Name: Larry Terrell MRN: [id number] DOB: May 24, 1961 Today's Date: 10/29/2019   History of Present Illness  59 y.o. male with a known history of coronary artery disease status post MI and PCI with 2 stents, diastolic CHF, COPD, depression, diabetes mellitus and hypertension, presented to the emergency room with acute onset of midsternal chest pain, In ED EKG showed Sinus rhythm with a rate of 80 with first-degree AV block and poor R wave progression with T wave inversion inferiorly and laterally. Pt admitted 10/20/19 and is s/p CABGx5 10/26/19.   Clinical Impression  On entry pt awake and requesting to get up to use bathroom. Pt with increased urgency pt requests to get to EoB, however when he starts to initiate movement to EoB he requires total A of LE, and trunk to get to EoB, once there he requires total A for maintaining balance only achieve static balance 1 time for 10 sec before requiring support for posterior, L lateral lean. Transfer to [facility] deemed unsafe. Pt requires total A for returning to bed and rolling for placement of bed pan. Pt was able to lift pelvis with minA for bracing feet for removal of bed pan. Pt bed placed in chair position. Pt only able to provide minimal information on home set up and PLOF. Repeating "my cousin" multiple times. PT recommending SNF level rehab at discharge to improve strength, balance and endurance. PT will continue to follow acutely.      Follow Up Recommendations SNF    Equipment Recommendations  Other (comment)(TBD at next venue)       Precautions / Restrictions Precautions Precautions: Sternal Precaution Booklet Issued: No Precaution Comments: pt educated on need to not push through his hands while coming to EoB, additional education limited by pt's cognition and attention      Mobility  Bed Mobility Overal bed mobility: Needs Assistance Bed Mobility: Supine to Sit;Sit to Supine;Rolling Rolling: Total  assist;+2 for physical assistance;+2 for safety/equipment   Supine to sit: Total assist;HOB elevated;+2 for safety/equipment;+2 for physical assistance Sit to supine: Total assist;+2 for physical assistance;+2 for safety/equipment   General bed mobility comments: on entry pt reports need to use BSC, with HoB elevated pt requires total A to come to seated, only minimal initiation, to return to bed pt requires total A for trunk and LE. Pt then rolled with total a for placement of bedpan, pt was able to bridge with min A to brace feet to remove bedpan  Transfers                 General transfer comment: unable to attempt         Balance Overall balance assessment: Needs assistance Sitting-balance support: Feet supported;Bilateral upper extremity supported Sitting balance-Leahy Scale: Zero Sitting balance - Comments: pt unable to maintain balance without max outside support, able to balance 1x for 10 sec but began with increased posterior lean  Postural control: Posterior lean;Left lateral lean                                   Pertinent Vitals/Pain Pain Assessment: Faces Faces Pain Scale: Hurts little more Pain Location: chest, generalized with movement Pain Descriptors / Indicators: Grimacing;Guarding;Moaning Pain Intervention(s): Limited activity within patient's tolerance;Monitored during session;Premedicated before session    Home Living Family/patient expects to be discharged to:: Private residence Living Arrangements: Other relatives(cousin) Available Help at Discharge: Available 24 hours/day  Type of Home: House                Prior Function Level of Independence: Needs assistance   Gait / Transfers Assistance Needed: reports walks with RW              Extremity/Trunk Assessment   Upper Extremity Assessment Upper Extremity Assessment: Defer to OT evaluation    Lower Extremity Assessment Lower Extremity Assessment: RLE  deficits/detail;LLE deficits/detail RLE Deficits / Details: ROM WFL, hip and knee and dorsiflexion strength grossly 2/5 plantarflexion 3/5 RLE Coordination: decreased fine motor;decreased gross motor LLE Deficits / Details: ROM WFL, hip and knee and dorsiflexion strength grossly 2/5 plantarflexion 3/5 LLE Coordination: decreased fine motor;decreased gross motor       Communication   Communication: Other (comment)(level of arousal)  Cognition Arousal/Alertness: Lethargic Behavior During Therapy: Flat affect Overall Cognitive Status: Difficult to assess                                 General Comments: pt with waxing and waning participation and response to questions      General Comments General comments (skin integrity, edema, etc.): Pt on 6L O2 via HFNC on entry with SaO2 in mid 90s, with movement dropped to mid 80s, rebounded to 94%O2 with rest        Assessment/Plan    PT Assessment Patient needs continued PT services  PT Problem List Decreased strength;Decreased range of motion;Decreased activity tolerance;Decreased balance;Decreased mobility;Decreased coordination;Decreased cognition;Decreased knowledge of use of DME;Decreased safety awareness;Cardiopulmonary status limiting activity;Decreased knowledge of precautions;Pain       PT Treatment Interventions DME instruction;Gait training;Functional mobility training;Therapeutic activities;Therapeutic exercise;Balance training;Cognitive remediation;Patient/family education    PT Goals (Current goals can be found in the Care Plan section)  Acute Rehab PT Goals Patient Stated Goal: none stated PT Goal Formulation: With patient Time For Goal Achievement: 11/12/19 Potential to Achieve Goals: Good    Frequency Min 2X/week        Co-evaluation PT/OT/SLP Co-Evaluation/Treatment: Yes Reason for Co-Treatment: For patient/therapist safety;Complexity of the patient's impairments (multi-system involvement) PT goals  addressed during session: Mobility/safety with mobility;Balance         AM-PAC PT "6 Clicks" Mobility  Outcome Measure Help needed turning from your back to your side while in a flat bed without using bedrails?: Total Help needed moving from lying on your back to sitting on the side of a flat bed without using bedrails?: Total Help needed moving to and from a bed to a chair (including a wheelchair)?: Total Help needed standing up from a chair using your arms (e.g., wheelchair or bedside chair)?: Total Help needed to walk in hospital room?: Total Help needed climbing 3-5 steps with a railing? : Total 6 Click Score: 6    End of Session Equipment Utilized During Treatment: Oxygen Activity Tolerance: Patient limited by fatigue;Patient limited by lethargy Patient left: in bed;with call bell/phone within reach;with bed alarm set(in chair position for improved respiration ) Nurse Communication: Mobility status(possible problem with air bladder in bed) PT Visit Diagnosis: Unsteadiness on feet (R26.81);Other abnormalities of gait and mobility (R26.89);Muscle weakness (generalized) (M62.81);Difficulty in walking, not elsewhere classified (R26.2)    Time: 8841-6606 PT Time Calculation (min) (ACUTE ONLY): 27 min   Charges:   PT Evaluation $PT Eval Moderate Complexity: 1 Mod          Kelijah Towry B. Migdalia Dk PT, DPT Acute Rehabilitation Services Pager [phone]  Office [phone]   [place] 10/29/2019, 4:32 PM

## 2019-10-29 NOTE — Progress Notes (Addendum)
Pt offered a sip of water to evaluate swallowing. With a small sip, pt took multiple attempts to swallow, reports that "it doesn't feel like it's going down." Pt eventually had a congested cough that took multiple tries to clear. Pt also lethargic but arousable and follows commands. Dr. Prescott Gum notified.

## 2019-10-29 NOTE — Progress Notes (Signed)
Peripherally Inserted Central Catheter Placement  The IV Nurse has discussed with the patient and/or persons authorized to consent for the patient, the purpose of this procedure and the potential benefits and risks involved with this procedure.  The benefits include less needle sticks, lab draws from the catheter, and the patient may be discharged home with the catheter. Risks include, but not limited to, infection, bleeding, blood clot (thrombus formation), and puncture of an artery; nerve damage and irregular heartbeat and possibility to perform a PICC exchange if needed/ordered by physician.  Alternatives to this procedure were also discussed.  Bard Power PICC patient education guide, fact sheet on infection prevention and patient information card has been provided to patient /or left at bedside. Patient requested this author and T. Rigg, RN to sign consent due to weakness and difficulty holding pen.Marland Kitchen   PICC Placement Documentation  PICC Double Lumen 32/20/25 PICC Right Basilic 40 cm (Active)  Indication for Insertion or Continuance of Line Prolonged intravenous therapies 10/29/19 1700  Exposed Catheter (cm) 0 cm 10/29/19 1700  Site Assessment Clean;Dry;Intact 10/29/19 1700  Lumen #1 Status Flushed;Blood return noted;Saline locked 10/29/19 1700  Lumen #2 Status Flushed;Blood return noted;Saline locked 10/29/19 1700  Dressing Type Transparent 10/29/19 1700  Dressing Status Clean;Dry;Intact;Antimicrobial disc in place 10/29/19 [place] checked and tightened 10/29/19 1700  Dressing Change Due 11/05/19 10/29/19 1700       [name] 10/29/2019, 5:04 PM

## 2019-10-29 NOTE — Evaluation (Addendum)
Occupational Therapy Evaluation Patient Details Name: Larry Terrell MRN: [id number] DOB: 04-13-1961 Today's Date: 10/29/2019    History of Present Illness 59 y.o. male with a known history of coronary artery disease status post MI and PCI with 2 stents, diastolic CHF, COPD, depression, diabetes mellitus and hypertension, presented to the emergency room with acute onset of midsternal chest pain, In ED EKG showed Sinus rhythm with a rate of 80 with first-degree AV block and poor R wave progression with T wave inversion inferiorly and laterally. Pt admitted 10/20/19 and is s/p CABGx5 10/26/19.    Clinical Impression   Pt admitted with above diagnoses, presenting with decreased level of arousal/cognition, and overall significant deconditioning limiting ability to participate in functional BADL. Upon arrival, pt needing to urgently use BSC. Pt compelted bed mobility with total A +2. Once EOB, required max A to maintain sitting balance. Pt wanting to get up to [facility] with little safety awareness to current situation. Pt was returned to bed and placed on bed pan. He then stated he did not have to use the bathroom. Pt was able to bridge with min A to remove bed pan. Chair position then utilized to facilitate OOB activity. Noted diffuse weakness with bil UEs limiting a functional hand pattern. Pt was able to reach nose with LUE with increased time and effort, but not without max A on RUE. Noted cognitive deficits with pt perseverating on specific topics and having difficulty recalling home info and PLOF. Given current status, recommending SNF at d/c for safe progression of BADL prior to returning home. Will continue to follow per POC listed below.     Follow Up Recommendations  SNF;Supervision/Assistance - 24 hour    Equipment Recommendations  Other (comment)(TBD)    Recommendations for Other Services       Precautions / Restrictions Precautions Precautions: Sternal Precaution Booklet Issued:  No Precaution Comments: pt educated on need to not push through his hands while coming to EoB, additional education limited by pt's cognition and attention      Mobility Bed Mobility Overal bed mobility: Needs Assistance Bed Mobility: Supine to Sit;Sit to Supine;Rolling Rolling: Total assist;+2 for physical assistance;+2 for safety/equipment   Supine to sit: Total assist;HOB elevated;+2 for safety/equipment;+2 for physical assistance Sit to supine: Total assist;+2 for physical assistance;+2 for safety/equipment   General bed mobility comments: on entry pt reports need to use BSC, with HoB elevated pt requires total A to come to seated, only minimal initiation, to return to bed pt requires total A for trunk and LE. Pt then rolled with total a for placement of bedpan, pt was able to bridge with min A to brace feet to remove bedpan  Transfers                 General transfer comment: unable to attempt    Balance Overall balance assessment: Needs assistance Sitting-balance support: Feet supported;Bilateral upper extremity supported Sitting balance-Leahy Scale: Zero Sitting balance - Comments: pt unable to maintain balance without max outside support, able to balance 1x for 10 sec but began with increased posterior lean. Also appeared to have jerking like motions in trunk Postural control: Posterior lean;Left lateral lean                                 ADL either performed or assessed with clinical judgement   ADL Overall ADL's : Needs assistance/impaired Eating/Feeding: NPO   Grooming: Maximal  assistance Grooming Details (indicate cue type and reason): max A to consistently complete functional hand to mouth pattern                               General ADL Comments: otherwise pt is total A for BADLs. Attempted to intiate toileting, pt requires total A +2 to sit EOB with max A to maintain sitting balance. Pt was not able to engage and attend to task  to safely complete OOB transfers or further BADL progression and needed to use bed pan     Vision   Vision Assessment?: No apparent visual deficits     Perception     Praxis      Pertinent Vitals/Pain Pain Assessment: Faces Faces Pain Scale: Hurts little more Pain Location: chest, generalized with movement Pain Descriptors / Indicators: Grimacing;Guarding;Moaning Pain Intervention(s): Limited activity within patient's tolerance;Monitored during session;Repositioned     Hand Dominance     Extremity/Trunk Assessment Upper Extremity Assessment Upper Extremity Assessment: Generalized weakness;LUE deficits/detail;RUE deficits/detail RUE Deficits / Details: max A to compelte functional hand to mouth pattern LUE Deficits / Details: with max effort and increased time could just lightly touch nose   Lower Extremity Assessment Lower Extremity Assessment: Defer to PT evaluation RLE Deficits / Details: ROM WFL, hip and knee and dorsiflexion strength grossly 2/5 plantarflexion 3/5 RLE Coordination: decreased fine motor;decreased gross motor LLE Deficits / Details: ROM WFL, hip and knee and dorsiflexion strength grossly 2/5 plantarflexion 3/5 LLE Coordination: decreased fine motor;decreased gross motor       Communication Communication Communication: Other (comment)(level of arousal)   Cognition Arousal/Alertness: Lethargic Behavior During Therapy: Flat affect Overall Cognitive Status: Difficult to assess Area of Impairment: Attention;Following commands;Awareness;Safety/judgement;Problem solving                   Current Attention Level: Focused   Following Commands: Follows one step commands inconsistently;Follows one step commands with increased time Safety/Judgement: Decreased awareness of safety;Decreased awareness of deficits Awareness: Intellectual Problem Solving: Slow processing;Requires verbal cues;Requires tactile cues General Comments: pt with waxing and waning  participation and response to questions   General Comments  Pt on 6L O2 via HFNC on entry with SaO2 in mid 90s, with movement dropped to mid 80s, rebounded to 94%O2 with rest    Exercises     Shoulder Instructions      Home Living Family/patient expects to be discharged to:: Private residence Living Arrangements: Other relatives(cousin) Available Help at Discharge: Available 24 hours/day Type of Home: House Home Access: Ramped entrance     Home Layout: One level     Bathroom Shower/Tub: Tub/shower unit         Home Equipment: Walker - 2 wheels;Grab bars - toilet;Grab bars - tub/shower;Cane - single point   Additional Comments: information on equipment taken from previous admission due to poor ability to recall this date      Prior Functioning/Environment Level of Independence: Needs assistance  Gait / Transfers Assistance Needed: reports walks with RW ADL's / Homemaking Assistance Needed: brother and cousin help with home tasks.            OT Problem List: Decreased strength;Decreased knowledge of use of DME or AE;Decreased knowledge of precautions;Decreased activity tolerance;Decreased cognition;Cardiopulmonary status limiting activity;Impaired UE functional use;Impaired balance (sitting and/or standing);Decreased safety awareness;Pain      OT Treatment/Interventions: Self-care/ADL training;Therapeutic exercise;Patient/family education;Balance training;Energy conservation;Therapeutic activities;DME and/or AE instruction    OT  Goals(Current goals can be found in the care plan section) Acute Rehab OT Goals Patient Stated Goal: none stated OT Goal Formulation: With patient Time For Goal Achievement: 11/12/19 Potential to Achieve Goals: Good  OT Frequency: Min 2X/week   Barriers to D/C:            Co-evaluation PT/OT/SLP Co-Evaluation/Treatment: Yes Reason for Co-Treatment: For patient/therapist safety;To address functional/ADL transfers;Complexity of the  patient's impairments (multi-system involvement) PT goals addressed during session: Mobility/safety with mobility;Balance OT goals addressed during session: ADL's and self-care;Strengthening/ROM      AM-PAC OT "6 Clicks" Daily Activity     Outcome Measure Help from another person eating meals?: Total(NPO) Help from another person taking care of personal grooming?: A Lot Help from another person toileting, which includes using toliet, bedpan, or urinal?: Total Help from another person bathing (including washing, rinsing, drying)?: Total Help from another person to put on and taking off regular upper body clothing?: Total Help from another person to put on and taking off regular lower body clothing?: Total 6 Click Score: 7   End of Session Equipment Utilized During Treatment: Oxygen Nurse Communication: Mobility status  Activity Tolerance: Patient limited by lethargy;Patient limited by fatigue Patient left: in bed;with call bell/phone within reach;with bed alarm set  OT Visit Diagnosis: Unsteadiness on feet (R26.81);Other abnormalities of gait and mobility (R26.89);Muscle weakness (generalized) (M62.81);Other symptoms and signs involving cognitive function                Time: 6578-4696 OT Time Calculation (min): 26 min Charges:  OT General Charges $OT Visit: 1 Visit OT Evaluation $OT Eval High Complexity: 1 High  Zenovia Jarred, MSOT, OTR/L [facility] Office Number: [phone] Pager: [phone]  Zenovia Jarred 10/29/2019, 4:40 PM

## 2019-10-30 ENCOUNTER — Inpatient Hospital Stay (HOSPITAL_COMMUNITY): Payer: Medicare HMO

## 2019-10-30 DIAGNOSIS — J9602 Acute respiratory failure with hypercapnia: Secondary | ICD-10-CM

## 2019-10-30 LAB — CBC
HCT: 31.4 % — ABNORMAL LOW (ref 39.0–52.0)
Hemoglobin: 10.4 g/dL — ABNORMAL LOW (ref 13.0–17.0)
MCH: 32 pg (ref 26.0–34.0)
MCHC: 33.1 g/dL (ref 30.0–36.0)
MCV: 96.6 fL (ref 80.0–100.0)
Platelets: 138 10*3/uL — ABNORMAL LOW (ref 150–400)
RBC: 3.25 MIL/uL — ABNORMAL LOW (ref 4.22–5.81)
RDW: 18 % — ABNORMAL HIGH (ref 11.5–15.5)
WBC: 13.6 10*3/uL — ABNORMAL HIGH (ref 4.0–10.5)
nRBC: 0 % (ref 0.0–0.2)

## 2019-10-30 LAB — COMPREHENSIVE METABOLIC PANEL
ALT: 18 U/L (ref 0–44)
AST: 16 U/L (ref 15–41)
Albumin: 2.8 g/dL — ABNORMAL LOW (ref 3.5–5.0)
Alkaline Phosphatase: 63 U/L (ref 38–126)
Anion gap: 15 (ref 5–15)
BUN: 36 mg/dL — ABNORMAL HIGH (ref 8–23)
CO2: 29 mmol/L (ref 22–32)
Calcium: 8.8 mg/dL — ABNORMAL LOW (ref 8.9–10.3)
Chloride: 90 mmol/L — ABNORMAL LOW (ref 98–111)
Creatinine, Ser: 1.98 mg/dL — ABNORMAL HIGH (ref 0.61–1.24)
GFR calc Af Amer: 41 mL/min — ABNORMAL LOW (ref >60)
GFR calc non Af Amer: 35 mL/min — ABNORMAL LOW (ref >60)
Glucose, Bld: 241 mg/dL — ABNORMAL HIGH (ref 70–99)
Potassium: 3.1 mmol/L — ABNORMAL LOW (ref 3.5–5.1)
Sodium: 134 mmol/L — ABNORMAL LOW (ref 135–145)
Total Bilirubin: 1 mg/dL (ref 0.3–1.2)
Total Protein: 6.2 g/dL — ABNORMAL LOW (ref 6.5–8.1)

## 2019-10-30 LAB — BASIC METABOLIC PANEL
Anion gap: 13 (ref 5–15)
BUN: 44 mg/dL — ABNORMAL HIGH (ref 8–23)
CO2: 31 mmol/L (ref 22–32)
Calcium: 8.4 mg/dL — ABNORMAL LOW (ref 8.9–10.3)
Chloride: 91 mmol/L — ABNORMAL LOW (ref 98–111)
Creatinine, Ser: 2.27 mg/dL — ABNORMAL HIGH (ref 0.61–1.24)
GFR calc Af Amer: 35 mL/min — ABNORMAL LOW (ref >60)
GFR calc non Af Amer: 30 mL/min — ABNORMAL LOW (ref >60)
Glucose, Bld: 155 mg/dL — ABNORMAL HIGH (ref 70–99)
Potassium: 3.1 mmol/L — ABNORMAL LOW (ref 3.5–5.1)
Sodium: 135 mmol/L (ref 135–145)

## 2019-10-30 LAB — GLUCOSE, CAPILLARY
Glucose-Capillary: 119 mg/dL — ABNORMAL HIGH (ref 70–99)
Glucose-Capillary: 153 mg/dL — ABNORMAL HIGH (ref 70–99)
Glucose-Capillary: 166 mg/dL — ABNORMAL HIGH (ref 70–99)
Glucose-Capillary: 170 mg/dL — ABNORMAL HIGH (ref 70–99)
Glucose-Capillary: 172 mg/dL — ABNORMAL HIGH (ref 70–99)
Glucose-Capillary: 206 mg/dL — ABNORMAL HIGH (ref 70–99)
Glucose-Capillary: 208 mg/dL — ABNORMAL HIGH (ref 70–99)

## 2019-10-30 MED ORDER — MAGNESIUM SULFATE 2 GM/50ML IV SOLN
2.0000 g | Freq: Once | INTRAVENOUS | Status: AC
  Administered 2019-10-30: 2 g via INTRAVENOUS
  Filled 2019-10-30: qty 50

## 2019-10-30 MED ORDER — ASPIRIN EC 325 MG PO TBEC
325.0000 mg | DELAYED_RELEASE_TABLET | Freq: Every day | ORAL | Status: DC
  Administered 2019-10-30 – 2019-10-31 (×2): 325 mg via ORAL
  Filled 2019-10-30 (×3): qty 1

## 2019-10-30 MED ORDER — POTASSIUM CHLORIDE 10 MEQ/50ML IV SOLN
10.0000 meq | INTRAVENOUS | Status: AC
  Administered 2019-10-30 (×3): 10 meq via INTRAVENOUS
  Filled 2019-10-30 (×3): qty 50

## 2019-10-30 MED ORDER — AMIODARONE LOAD VIA INFUSION
150.0000 mg | Freq: Once | INTRAVENOUS | Status: AC
  Administered 2019-10-30: 150 mg via INTRAVENOUS
  Filled 2019-10-30: qty 83.34

## 2019-10-30 MED ORDER — AMIODARONE IV BOLUS ONLY 150 MG/100ML: 150.0000 mg | Freq: Once | INTRAVENOUS | Status: DC

## 2019-10-30 MED ORDER — POTASSIUM CHLORIDE 10 MEQ/50ML IV SOLN
10.0000 meq | INTRAVENOUS | Status: AC
  Administered 2019-10-30 (×2): 10 meq via INTRAVENOUS
  Filled 2019-10-30 (×2): qty 50

## 2019-10-30 MED ORDER — COLCHICINE 0.3 MG HALF TABLET
0.3000 mg | ORAL_TABLET | Freq: Two times a day (BID) | ORAL | Status: DC
  Administered 2019-10-30 (×2): 0.3 mg via ORAL
  Filled 2019-10-30 (×3): qty 1

## 2019-10-30 MED ORDER — POTASSIUM CHLORIDE 10 MEQ/50ML IV SOLN
INTRAVENOUS | Status: AC
  Administered 2019-10-30: 10 meq via INTRAVENOUS
  Filled 2019-10-30: qty 50

## 2019-10-30 MED ORDER — ISOSORBIDE DINITRATE 10 MG PO TABS
20.0000 mg | ORAL_TABLET | Freq: Three times a day (TID) | ORAL | Status: DC
  Administered 2019-10-30 – 2019-11-06 (×22): 20 mg via ORAL
  Filled 2019-10-30 (×23): qty 2

## 2019-10-30 NOTE — Progress Notes (Signed)
Patient ID: Larry Terrell, male   DOB: [birth year], 59 y.o.   MRN: [id number] EVENING ROUNDS NOTE :     [address]       [place] [address]             [phone]                 4 Days Post-Op Procedure(s) (LRB): CORONARY ARTERY BYPASS GRAFTING (CABG) x 5, Using Bilateral mammary arteries, and right leg greater saphenous vein harvested endoscopically (N/A) RADIAL ARTERY HARVEST (Left) TRANSESOPHAGEAL ECHOCARDIOGRAM (TEE) (N/A) INDOCYANINE GREEN FLUORESCENCE IMAGING (ICG) (N/A)  Total Length of Stay:  LOS: 10 days  BP (!) 104/57   Pulse (!) 121   Temp 97.8 F (36.6 C)   Resp (!) 21   Ht 5' 7"  (1.702 m)   Wt 89 kg   SpO2 94%   BMI 30.73 kg/m   .Intake/Output      04/18 0701 - 04/19 0700 04/19 0701 - 04/20 0700   P.O.     I.V. (mL/kg) 1073.4 (12.1) 397.9 (4.5)   IV Piggyback 1787.6 326.4   Total Intake(mL/kg) 2861 (32.1) 724.3 (8.1)   Urine (mL/kg/hr) 6880 (3.2) 1525 (1.6)   Stool  0   Chest Tube     Total Output 6880 1525   Net -4019.1 -800.7        Stool Occurrence  1 x     . sodium chloride Stopped (10/28/19 0953)  . sodium chloride    . sodium chloride 20 mL/hr at 10/26/19 1514  . amiodarone 30 mg/hr (10/30/19 1700)  . cefTRIAXone (ROCEPHIN)  IV Stopped (10/29/19 2046)  . furosemide (LASIX) infusion 10 mg/hr (10/30/19 1700)  . lactated ringers    . lactated ringers    . lactated ringers Stopped (10/29/19 2016)  . levETIRAcetam Stopped (10/30/19 1629)     Lab Results  Component Value Date   WBC 13.6 (H) 10/30/2019   HGB 10.4 (L) 10/30/2019   HCT 31.4 (L) 10/30/2019   PLT 138 (L) 10/30/2019   GLUCOSE 155 (H) 10/30/2019   CHOL 160 10/19/2019   TRIG 482 (H) 10/19/2019   HDL 23 (L) 10/19/2019   LDLDIRECT 72.0 10/19/2019   LDLCALC UNABLE TO CALCULATE IF TRIGLYCERIDE OVER 400 mg/dL 10/19/2019   ALT 18 10/30/2019   AST 16 10/30/2019   NA 135 10/30/2019   K 3.1 (L) 10/30/2019   CL 91 (L) 10/30/2019   CREATININE 2.27 (H) 10/30/2019   BUN 44 (H) 10/30/2019   CO2 31 10/30/2019   TSH 0.176 (L) 12/15/2013   INR 1.4 (H) 10/26/2019   HGBA1C 7.3 (H) 10/18/2019   Still in a fib on icv cordrone Cr at basline 2.2. Good uop with lasix drip    Grace Isaac MD  Beeper [phone] Office [phone] 10/30/2019 6:01 PM

## 2019-10-30 NOTE — Progress Notes (Addendum)
Physical Therapy Treatment Patient Details Name: Larry Terrell MRN: [id number] DOB: June 14, 1961 Today's Date: 10/30/2019    History of Present Illness 59 y.o. male with a known history of CAD, MI and PCI with 2 stents, diastolic CHF, COPD, depression, DM, HTN. Pt admitted with acute onset of midsternal chest pain, In ED EKG showed Sinus rhythm with a rate of 80 with first-degree AV block and poor R wave progression with T wave inversion inferiorly and laterally. Pt admitted 10/20/19 and is s/Terrell CABGx5 10/26/19.    PT Comments    Pt supine on arrival complaining of being cold and wanting water. Pt able to progress to sitting and standing utilizing foot egress position of bed with equipment traded behind him for transfer to chair. Pt educated for all precautions and HEP. Will continue to follow.   BP 153/71 before After 78/56 (65) HR 101-112 SpO2 93% on 6L    Follow Up Recommendations  SNF     Equipment Recommendations  Rolling walker with 5" wheels    Recommendations for Other Services       Precautions / Restrictions Precautions Precautions: Sternal;Fall Precaution Comments: pt with cues for precautions and safety, cortrak    Mobility  Bed Mobility Overal bed mobility: Needs Assistance Bed Mobility: Supine to Sit           General bed mobility comments: supine to sit via chair function of bed  Transfers Overall transfer level: Needs assistance   Transfers: Sit to/from Stand Sit to Stand: Mod assist;+2 physical assistance         General transfer comment: mod +2 assist to stand from foot egress position of bed with knees blocked use of belt and pad with cues for sequence and safety with bil UE support in standing. Pt stood grossly 10 sec then 45 sec. On 2nd trial RN switched bed for recliner behind pt to transfer  Ambulation/Gait             General Gait Details: unable   Stairs             Wheelchair Mobility    Modified Rankin (Stroke Patients  Only)       Balance   Sitting-balance support: Feet supported Sitting balance-Leahy Scale: Poor     Standing balance support: Bilateral upper extremity supported Standing balance-Leahy Scale: Poor                              Cognition Arousal/Alertness: Awake/alert Behavior During Therapy: Flat affect Overall Cognitive Status: Impaired/Different from baseline Area of Impairment: Attention;Following commands;Awareness;Safety/judgement;Problem solving;Orientation;Memory                 Orientation Level: Disoriented to;Time Current Attention Level: Sustained Memory: Decreased short-term memory Following Commands: Follows one step commands inconsistently;Follows one step commands with increased time Safety/Judgement: Decreased awareness of safety;Decreased awareness of deficits Awareness: Intellectual Problem Solving: Slow processing;Requires verbal cues;Requires tactile cues General Comments: pt not oriented to year or day, pt continued to ask for water despite education to be cleared by SLP first. Pt frequently stating "I'm a sexy man" ,"hurry the hell up", "water"      Exercises General Exercises - Lower Extremity Long Arc Quad: AROM;Both;Seated;15 reps Hip ABduction/ADduction: AROM;Both;Seated;15 reps Hip Flexion/Marching: AROM;Both;Seated;15 reps    General Comments        Pertinent Vitals/Pain Pain Assessment: No/denies pain    Home Living  Prior Function            PT Goals (current goals can now be found in the care plan section) Progress towards PT goals: Progressing toward goals    Frequency    Min 3X/week      PT Plan Current plan remains appropriate    Co-evaluation              AM-PAC PT "6 Clicks" Mobility   Outcome Measure  Help needed turning from your back to your side while in a flat bed without using bedrails?: Total Help needed moving from lying on your back to sitting on the  side of a flat bed without using bedrails?: Total Help needed moving to and from a bed to a chair (including a wheelchair)?: Total Help needed standing up from a chair using your arms (e.g., wheelchair or bedside chair)?: A Lot Help needed to walk in hospital room?: Total Help needed climbing 3-5 steps with a railing? : Total 6 Click Score: 7    End of Session Equipment Utilized During Treatment: Oxygen;Gait belt Activity Tolerance: Patient tolerated treatment well Patient left: in chair;with call bell/phone within reach;with chair alarm set;with nursing/sitter in room Nurse Communication: Mobility status;Precautions PT Visit Diagnosis: Unsteadiness on feet (R26.81);Other abnormalities of gait and mobility (R26.89);Muscle weakness (generalized) (M62.81);Difficulty in walking, not elsewhere classified (R26.2)     Time: 5726-2035 PT Time Calculation (min) (ACUTE ONLY): 25 min  Charges:  $Therapeutic Exercise: 8-22 mins $Therapeutic Activity: 8-22 mins                     Larry Terrell, PT Acute Rehabilitation Services Pager: [phone] Office: [place] Larry Terrell 10/30/2019, 1:37 PM

## 2019-10-30 NOTE — NC FL2 (Signed)
[place] MEDICAID FL2 LEVEL OF CARE SCREENING TOOL     IDENTIFICATION  Patient Name: Larry Terrell Birthdate: [birth year] Sex: male Admission Date (Current Location): 10/20/2019  [facility] and [place] Number:  Herbalist and Address:  [facility], [place] [address], [place], [place] [address]      Provider Number: [id number]  Attending Physician Name and Address:  Wonda Olds, MD  Relative Name and Phone Number:  Maudie Mercury [phone]    Current Level of Care: Hospital Recommended Level of Care: [place] Prior Approval Number:    Date Approved/Denied: 10/30/19 PASRR Number: [id number]  Discharge Plan: SNF    Current Diagnoses: Patient Active Problem List   Diagnosis Date Noted  . Open harvest of left radial artery 10/27/2019  . S/P CABG x 5   . CAD in native artery 10/20/2019  . Unstable angina ([place]) 10/20/2019  . (HFpEF) heart failure with preserved ejection fraction ([place]) 10/20/2019  . Essential hypertension 10/20/2019  . Hyperlipidemia LDL goal <70 10/20/2019  . PAD (peripheral artery disease) ([place]) 10/20/2019  . Chest pain 10/18/2019  . Altered mental status 06/04/2019  . Sepsis ([place]) 06/04/2019  . AKI (acute kidney injury) ([place]) 06/04/2019  . GERD (gastroesophageal reflux disease) 06/04/2019  . History of seizure 06/04/2019  . Stroke ([place]) 04/01/2019  . Acute appendicitis 07/10/2015  . S/P laparoscopic appendectomy 07/10/2015  . Appendicitis, acute   . Diabetes mellitus with complication ([place])   . Chronic congestive heart failure with left ventricular diastolic dysfunction ([place])   . Lactic acidosis   . Acute respiratory failure with hypoxia (HCC)     Orientation RESPIRATION BLADDER Height & Weight     Self, Time, Situation, Place  O2(Nasal Cannula 6 liters) Continent(Urinary Catheter) Weight: 196 lb 3.4 oz (89 kg) Height:  5' 7"  (170.2 cm)  BEHAVIORAL SYMPTOMS/MOOD NEUROLOGICAL BOWEL NUTRITION STATUS     Continent Diet(See Discharge Summary)  AMBULATORY STATUS COMMUNICATION OF NEEDS Skin   Extensive Assist Verbally Surgical wounds, Other (Comment)(Approp. for ethnicity, surgical incisions leg bilateral groin right, gauze thin film, groin;leg;nose;bilateral skin turgor non-tenting, incision closed arm left, incision closed leg right,Incision closed chest)                       Personal Care Assistance Level of Assistance  Bathing, Feeding, Dressing Bathing Assistance: Maximum assistance Feeding assistance: Limited assistance(NPO) Dressing Assistance: Maximum assistance     Functional Limitations Info  Sight, Hearing, Speech Sight Info: Impaired Hearing Info: Adequate Speech Info: Adequate    SPECIAL CARE FACTORS FREQUENCY  PT (By licensed PT), OT (By licensed OT)     PT Frequency: 5x min weekly OT Frequency: 5x min weekly            Contractures Contractures Info: Not present    Additional Factors Info  Code Status, Allergies Code Status Info: FULL Allergies Info: Gabapentin,Ace Inhibitors           Current Medications (10/30/2019):  This is the current hospital active medication list Current Facility-Administered Medications  Medication Dose Route Frequency Provider Last Rate Last Admin  . 0.45 % sodium chloride infusion   Intravenous Continuous PRN Barrett, Erin R, PA-C   Stopped at 10/28/19 0953  . 0.9 %  sodium chloride infusion (Manually program via Guardrails IV Fluids)   Intravenous Once Gaylene Brooks, CRNA      . 0.9 %  sodium chloride infusion  250 mL Intravenous Continuous Barrett, Junie Panning  R, PA-C      . 0.9 %  sodium chloride infusion   Intravenous Continuous Barrett, Erin R, PA-C 20 mL/hr at 10/26/19 1514 New Bag/Given (Non-Interop) at 10/26/19 1514  . acetaminophen (TYLENOL) tablet 1,000 mg  1,000 mg Oral Q6H Barrett, Erin R, PA-C   1,000 mg at 10/30/19 1046   Or  . acetaminophen (TYLENOL) 160 MG/5ML solution 1,000 mg  1,000 mg Per Tube Q6H Barrett,  Erin R, PA-C   1,000 mg at 10/28/19 0700  . acetaminophen (TYLENOL) tablet 650 mg  650 mg Oral Q4H PRN Barrett, Evelene Croon, PA-C      . ALPRAZolam Duanne Moron) tablet 0.25 mg  0.25 mg Oral BID PRN Barrett, Erin R, PA-C   0.25 mg at 10/27/19 0946  . amiodarone (NEXTERONE PREMIX) 360-4.14 MG/200ML-% (1.8 mg/mL) IV infusion  30 mg/hr Intravenous Continuous Wonda Olds, MD 16.67 mL/hr at 10/30/19 1500 30 mg/hr at 10/30/19 1500  . aspirin EC tablet 325 mg  325 mg Oral Daily Wonda Olds, MD   325 mg at 10/30/19 1043  . atorvastatin (LIPITOR) tablet 80 mg  80 mg Oral q1800 Barrett, Erin R, PA-C   80 mg at 10/27/19 1818  . bisacodyl (DULCOLAX) EC tablet 10 mg  10 mg Oral Daily Barrett, Erin R, PA-C   10 mg at 10/30/19 1011   Or  . bisacodyl (DULCOLAX) suppository 10 mg  10 mg Rectal Daily Barrett, Erin R, PA-C   10 mg at 10/29/19 1101  . cefTRIAXone (ROCEPHIN) 1 g in sodium chloride 0.9 % 100 mL IVPB  1 g Intravenous Q24H Ivin Poot, MD   Stopped at 10/29/19 2046  . chlorhexidine (PERIDEX) 0.12 % solution 15 mL  15 mL Mouth Rinse BID Wonda Olds, MD   15 mL at 10/30/19 0937  . Chlorhexidine Gluconate Cloth 2 % PADS 6 each  6 each Topical Daily Wonda Olds, MD   6 each at 10/30/19 0800  . colchicine tablet 0.3 mg  0.3 mg Oral BID Wonda Olds, MD   0.3 mg at 10/30/19 1050  . dextrose 50 % solution 0-50 mL  0-50 mL Intravenous PRN Barrett, Erin R, PA-C      . docusate sodium (COLACE) capsule 200 mg  200 mg Oral Daily Barrett, Erin R, PA-C   200 mg at 10/30/19 1011  . fentaNYL (SUBLIMAZE) injection 25 mcg  25 mcg Intravenous Q2H PRN Ivin Poot, MD   25 mcg at 10/28/19 2044  . furosemide (LASIX) 250 mg in dextrose 5 % 250 mL (1 mg/mL) infusion  10 mg/hr Intravenous Continuous Wonda Olds, MD 10 mL/hr at 10/30/19 1500 10 mg/hr at 10/30/19 1500  . hydrALAZINE (APRESOLINE) injection 20 mg  20 mg Intravenous Q4H PRN Wonda Olds, MD   20 mg at 10/29/19 0411  . insulin  aspart (novoLOG) injection 0-24 Units  0-24 Units Subcutaneous Q4H Wonda Olds, MD   8 Units at 10/30/19 1204  . insulin glargine (LANTUS) injection 10 Units  10 Units Subcutaneous BID Ivin Poot, MD   10 Units at 10/30/19 1043  . ipratropium-albuterol (DUONEB) 0.5-2.5 (3) MG/3ML nebulizer solution 3 mL  3 mL Nebulization Q6H PRN Helberg, Justin, MD      . isosorbide dinitrate (ISORDIL) tablet 20 mg  20 mg Oral TID Wonda Olds, MD   20 mg at 10/30/19 1614  . lactated ringers infusion 500 mL  500 mL Intravenous Once PRN Barrett, Lodema Hong,  PA-C      . lactated ringers infusion   Intravenous Continuous Barrett, Erin R, PA-C      . lactated ringers infusion   Intravenous Continuous Wonda Olds, MD   Stopped at 10/29/19 2016  . levalbuterol (XOPENEX) nebulizer solution 0.63 mg  0.63 mg Nebulization Q6H Atkins, Glenice Bow, MD   0.63 mg at 10/29/19 1954  . levETIRAcetam (KEPPRA) IVPB 500 mg/100 mL premix  500 mg Intravenous Q8H Einar Grad, RPH 400 mL/hr at 10/30/19 1614 500 mg at 10/30/19 1614  . MEDLINE mouth rinse  15 mL Mouth Rinse q12n4p Wonda Olds, MD   15 mL at 10/30/19 1616  . metolazone (ZAROXOLYN) tablet 5 mg  5 mg Oral Once [place]cott Gum, Collier Salina, MD      . metoprolol tartrate (LOPRESSOR) tablet 12.5 mg  12.5 mg Oral BID Barrett, Erin R, PA-C   12.5 mg at 10/30/19 1011   Or  . metoprolol tartrate (LOPRESSOR) 25 mg/10 mL oral suspension 12.5 mg  12.5 mg Per Tube BID Barrett, Erin R, PA-C   12.5 mg at 10/28/19 2100  . metoprolol tartrate (LOPRESSOR) injection 2.5-5 mg  2.5-5 mg Intravenous Q2H PRN Barrett, Erin R, PA-C   5 mg at 10/29/19 1233  . nitroGLYCERIN (NITROSTAT) SL tablet 0.4 mg  0.4 mg Sublingual Q5 Min x 3 PRN Barrett, Rhonda G, PA-C      . omega-3 acid ethyl esters (LOVAZA) capsule 1 g  1 g Oral BID Barrett, Erin R, PA-C   1 g at 10/30/19 1011  . ondansetron (ZOFRAN) injection 4 mg  4 mg Intravenous Q6H PRN Barrett, Erin R, PA-C   4 mg at 10/27/19 1827  .  oxyCODONE (Oxy IR/ROXICODONE) immediate release tablet 5-10 mg  5-10 mg Oral Q3H PRN Barrett, Erin R, PA-C   10 mg at 10/27/19 1828  . pantoprazole (PROTONIX) injection 40 mg  40 mg Intravenous Q24H Einar Grad, RPH   40 mg at 10/30/19 1013  . pregabalin (LYRICA) capsule 50 mg  50 mg Oral TID Barrett, Erin R, PA-C   50 mg at 10/30/19 1614  . sodium chloride flush (NS) 0.9 % injection 10-40 mL  10-40 mL Intracatheter Q12H Wonda Olds, MD   10 mL at 10/29/19 2142  . sodium chloride flush (NS) 0.9 % injection 10-40 mL  10-40 mL Intracatheter Q12H Wonda Olds, MD   10 mL at 10/29/19 2142  . sodium chloride flush (NS) 0.9 % injection 10-40 mL  10-40 mL Intracatheter PRN Atkins, Glenice Bow, MD      . sodium chloride flush (NS) 0.9 % injection 3 mL  3 mL Intravenous PRN Barrett, Erin R, PA-C      . traMADol (ULTRAM) tablet 50-100 mg  50-100 mg Oral Q4H PRN Barrett, Erin R, PA-C      . venlafaxine (EFFEXOR) tablet 75 mg  75 mg Oral TID WC Barrett, Erin R, PA-C   75 mg at 10/30/19 1616     Discharge Medications: Please see discharge summary for a list of discharge medications.  Relevant Imaging Results:  Relevant Lab Results:   Additional Information [id number]  Trula Ore, LCSWA

## 2019-10-30 NOTE — Progress Notes (Addendum)
NAME:  Larry Terrell, MRN:  [id number], DOB:  May 14, 1961, LOS: 9 ADMISSION DATE:  10/20/2019, CONSULTATION DATE:  10/27/19 REFERRING MD:  Julien Girt, CHIEF COMPLAINT:  Chest pain   Brief History   59 year old man with history of metabolic syndrome who presented with anginal symptoms ultimately requiring CABG x 5 on 10/26/19 presenting with post-op respiratory failure.  Patient extubated 4/16 to BiPAP. He is saturating well, but did not tolerate removing bipap for medications.  History of present illness   See above  Past Medical History  Severe CAD s/p CABG PAD s/p Aorto-bifemoral bypass DM HTN Hepatic steatosis and splenomegaly  Significant Hospital Events   4/15 CABG x 5 4/16 Extubated to BiPAP 4/18 weaning BiPAP settings 5/5  Consults:  PCCM  Procedures:  n/a  Significant Diagnostic Tests:  CXR chest tubes in place, low lung volumes, atelectsis CXR 4/18>some interval increase in lung volumes on L, however persistent congestion  Micro Data:  COVID neg MRSA PCR neg  Antimicrobials:  Vanc+cefuroxime x 1 (10/26/19) Ceftriaxone 4/18>  Interim history/subjective:  Patient is resting comfortably in bed on BiPAP. Continues to have good diuresis. He denies any new complaints today.  Objective   Blood pressure (!) 175/73, pulse 100, temperature 99.9 F (37.7 C), temperature source Axillary, resp. rate 17, height 5' 7"  (1.702 m), weight 89 kg, SpO2 96 %.    Vent Mode: BIPAP;PCV FiO2 (%):  [40 %] 40 % Set Rate:  [8 bmp] 8 bmp PEEP:  [5 cmH20-7 cmH20] 5 cmH20 Plateau Pressure:  [9 cmH20] 9 cmH20   Intake/Output Summary (Last 24 hours) at 10/30/2019 0626 Last data filed at 10/30/2019 0600 Gross per 24 hour  Intake 3413.79 ml  Output 6530 ml  Net -3116.21 ml   Filed Weights   10/28/19 0630 10/29/19 0500 10/30/19 0431  Weight: 97.3 kg 97 kg 89 kg    Examination: General: Middle aged obese M, reclined in bed in NAD on BiPAP  Neuro: AA Following commands. PERRLA    HEENT: NCAT. BiPAP in place with good seal. Trachea midline. Anicteric sclera.  Pulm: Symmetrical chest expansion, no accessory muscle use on BiPAP 5/5. No adventitious sounds.  CV: RRR s1s2. Cap refill < 3 seconds  GI: Round, mildly distended. Non-tender. + bowel sounds  GU: defer  Extremities: Non-pitting dependent BUE edema improved. No cyanosis or clubbing  Skin: c/d/w. Midline sternotomy dressing c/d/i  Psych: Calm, no anxious psychomotor activity   Resolved Hospital Problem list     Assessment & Plan:  Post operative hypoxemic respiratory failure  - PFT indicates restrictive pattern, with mixed etiology of obesity and volume overload.  - CXR with persistent edema, congestion. Likely component of atelectasis  - Continue BiPAP prn and qHS and [place] prn. - Mobilize, up to chair  - Continue diuresis, down 3.6 liters overnight.  - AM CXR pending  - PCCM is available to answer any questions.  S/p CABG Cardiogenic shock -Post-op per CVTS -ICU monitoring  -milrinone, weaning  Chronic diastolic HF -on IV NTG, PRN BB   AKI on CKD, improving  -Cr down-trending -Trend renal indices, UOP -minimize nephrotoxic agents  -Continuing diuresis as above   Atrial Fibrillation -currently NSR/ Sinus Tach -Continue IV Amio -Cardiac monitoring  Leukocytosis  -variable WBC, variable Temp -Trend WBC, temp. APAP for fever - On ceftriaxone and respiratory culture pending.  DM with Hyperglycemia -rSSI, Lantus    Best practice:  Diet:NPO on BiPAP, Heart health diet Pain/Anxiety/Delirium protocol (if indicated):PRN oxycodone, ultram,  fentanyl  VAP protocol (if indicated):na DVT prophylaxis:per primary GI prophylaxis:PPI Glucose control:Resistant SSI + Basal Mobility:BR Code Status:full Family Communication:per primary Disposition:ICU   Labs   CBC: Recent Labs  Lab 10/26/19 2020 10/26/19 2125 10/27/19 0328 10/27/19 0337 10/27/19 1510 10/27/19 1815  10/28/19 0040 10/28/19 0342 10/28/19 0700 10/28/19 1347 10/29/19 0321  WBC 16.8*  --  15.8*  --  17.5*  --   --  12.9*  --   --  14.7*  HGB 11.1*   < > 10.8*   < > 9.8*   < > 10.2* 9.8* 8.8* 8.8* 9.8*  HCT 32.7*   < > 31.5*   < > 28.6*   < > 30.0* 28.9* 26.0* 26.0* 29.5*  MCV 93.2  --  94.3  --  93.2  --   --  95.1  --   --  95.5  PLT 142*  --  154  --  158  --   --  126*  --   --  128*   < > = values in this interval not displayed.    Basic Metabolic Panel: Recent Labs  Lab 10/26/19 2020 10/26/19 2125 10/27/19 0328 10/27/19 0337 10/27/19 1510 10/27/19 1815 10/28/19 0342 10/28/19 0342 10/28/19 0700 10/28/19 1347 10/28/19 1627 10/29/19 0321 10/29/19 1753  NA 136   < > 135   < > 136   < > 133*   < > 134* 134* 135 134* 135  K 5.9*   < > 5.3*   < > 4.9   < > 5.4*   < > 5.3* 4.9 4.6 4.1 3.3*  CL 106   < > 106   < > 105  --  102  --   --   --  101 98 95*  CO2 23   < > 22   < > 21*  --  20*  --   --   --  21* 23 27  GLUCOSE 195*   < > 167*   < > 163*  --  187*  --   --   --  235* 263* 190*  BUN 25*   < > 26*   < > 29*  --  30*  --   --   --  33* 35* 36*  CREATININE 1.55*   < > 1.84*   < > 2.37*  --  2.52*  --   --   --  2.45* 2.29* 2.03*  CALCIUM 7.3*   < > 7.5*   < > 8.0*  --  8.2*  --   --   --  8.2* 8.5* 8.6*  MG 2.9*  --  2.7*  --  2.5*  --   --   --   --   --   --   --   --   PHOS  --   --   --   --   --   --   --   --   --   --  4.8*  --   --    < > = values in this interval not displayed.   GFR: Estimated Creatinine Clearance: 40.7 mL/min (A) (by C-G formula based on SCr of 2.03 mg/dL (H)). Recent Labs  Lab 10/27/19 0328 10/27/19 1510 10/28/19 0342 10/29/19 0321  WBC 15.8* 17.5* 12.9* 14.7*    Liver Function Tests: Recent Labs  Lab 10/28/19 0342 10/28/19 1627 10/29/19 0321  AST 22  --  18  ALT 20  --  20  ALKPHOS 50  --  53  BILITOT 1.4*  --  1.1  PROT 5.7*  --  5.9*  ALBUMIN 3.2* 3.0* 2.9*   No results for input(s): LIPASE, AMYLASE in the last 168  hours. No results for input(s): AMMONIA in the last 168 hours.  ABG    Component Value Date/Time   PHART 7.399 10/28/2019 1347   PCO2ART 36.6 10/28/2019 1347   PO2ART 66.0 (L) 10/28/2019 1347   HCO3 23.6 10/28/2019 1347   TCO2 25 10/28/2019 1347   ACIDBASEDEF 2.0 10/28/2019 1347   O2SAT 96.0 10/28/2019 1347     Coagulation Profile: Recent Labs  Lab 10/26/19 1458  INR 1.4*    Cardiac Enzymes: No results for input(s): CKTOTAL, CKMB, CKMBINDEX, TROPONINI in the last 168 hours.  HbA1C: Hemoglobin A1C  Date/Time Value Ref Range Status  12/15/2013 03:43 AM 8.3 (H) 4.2 - 6.3 % Final    Comment:    The American Diabetes Association recommends that a primary goal of therapy should be <7% and that physicians should reevaluate the treatment regimen in patients with HbA1c values consistently >8%.   10/03/2013 12:11 PM 7.5 (H) 4.2 - 6.3 % Final    Comment:    The American Diabetes Association recommends that a primary goal of therapy should be <7% and that physicians should reevaluate the treatment regimen in patients with HbA1c values consistently >8%.    Hgb A1c MFr Bld  Date/Time Value Ref Range Status  10/18/2019 10:56 PM 7.3 (H) 4.8 - 5.6 % Final    Comment:    (NOTE)         Prediabetes: 5.7 - 6.4         Diabetes: >6.4         Glycemic control for adults with diabetes: <7.0   06/05/2019 04:52 AM 6.9 (H) 4.8 - 5.6 % Final    Comment:    (NOTE) Pre diabetes:          5.7%-6.4% Diabetes:              >6.4% Glycemic control for   <7.0% adults with diabetes     CBG: Recent Labs  Lab 10/29/19 1132 10/29/19 1557 10/29/19 1946 10/29/19 2314 10/30/19 0417  GLUCAP 205* 175* 190* 185* 206*    Review of Systems:   Negative except for what is noted in subjective.  Past Medical History  He,  has a past medical history of CHF (congestive heart failure) ([place]), Cirrhosis ([place]), COPD (chronic obstructive pulmonary disease) ([place]), Depression, Diabetes mellitus with  neuropathy ([place]), Heart attack ([place]), Neuropathy, OSA (obstructive sleep apnea), Peripheral vascular disease due to secondary diabetes mellitus ([place]), Splenomegaly, Temporal giant cell arteritis ([place]), and TIA (transient ischemic attack) (April 2016).   Surgical History    Past Surgical History:  Procedure Laterality Date  . AORTA - FEMORAL ARTERY BYPASS GRAFT Bilateral   . CARDIAC CATHETERIZATION    . cardiac stents  July 2015   . CORONARY ARTERY BYPASS GRAFT N/A 10/26/2019   Procedure: CORONARY ARTERY BYPASS GRAFTING (CABG) x 5, Using Bilateral mammary arteries, and right leg greater saphenous vein harvested endoscopically;  Surgeon: Wonda Olds, MD;  Location: [place];  Service: Open Heart Surgery;  Laterality: N/A;  . CORONARY STENT INTERVENTION N/A 08/09/2017   Procedure: CORONARY STENT INTERVENTION;  Surgeon: Wellington Hampshire, MD;  Location: [place] [facility];  Service: Cardiovascular;  Laterality: N/A;  . LAPAROSCOPIC APPENDECTOMY N/A 07/10/2015   Procedure: APPENDECTOMY LAPAROSCOPIC;  Surgeon:  Rolm Bookbinder, MD;  Location: [facility];  Service: General;  Laterality: N/A;  . LEFT HEART CATH AND CORONARY ANGIOGRAPHY N/A 08/09/2017   Procedure: LEFT HEART CATH AND CORONARY ANGIOGRAPHY;  Surgeon: Wellington Hampshire, MD;  Location: [place] [facility];  Service: Cardiovascular;  Laterality: N/A;  . LEFT HEART CATH AND CORONARY ANGIOGRAPHY N/A 10/19/2019   Procedure: LEFT HEART CATH AND CORONARY ANGIOGRAPHY;  Surgeon: Minna Merritts, MD;  Location: [facility];  Service: Cardiovascular;  Laterality: N/A;  . RADIAL ARTERY HARVEST Left 10/26/2019   Procedure: RADIAL ARTERY HARVEST;  Surgeon: Wonda Olds, MD;  Location: [facility];  Service: Open Heart Surgery;  Laterality: Left;  . TEE WITHOUT CARDIOVERSION N/A 10/26/2019   Procedure: TRANSESOPHAGEAL ECHOCARDIOGRAM (TEE);  Surgeon: Wonda Olds, MD;  Location: [facility];  Service: Open Heart Surgery;  Laterality: N/A;      Social History   reports that he quit smoking about 12 years ago. His smoking use included cigarettes. He has never used smokeless tobacco. He reports that he does not drink alcohol or use drugs.   Family History   His family history includes Alzheimer's disease in his father; Heart attack in his father; Heart attack (age of onset: 51) in his mother; Stroke in his mother.   Allergies Allergies  Allergen Reactions  . Gabapentin   . Ace Inhibitors Cough    Other reaction(s): Other (See Comments) cough     Home Medications  Prior to Admission medications   Medication Sig Start Date End Date Taking? Authorizing Provider  acetaminophen (TYLENOL) 500 MG tablet Take 1,000 mg by mouth daily as needed.    [provider]  amLODipine (NORVASC) 5 MG tablet Take 5 mg by mouth daily. 09/04/19 09/03/20  [provider]  aspirin 81 MG chewable tablet Chew 1 tablet (81 mg total) by mouth daily. 10/20/19   Ezekiel Slocumb, DO  atorvastatin (LIPITOR) 40 MG tablet Take 40 mg by mouth daily.     [provider]  atorvastatin (LIPITOR) 80 MG tablet Take 1 tablet (80 mg total) by mouth daily. 10/21/19   Minna Merritts, MD  ezetimibe (ZETIA) 10 MG tablet Take 1 tablet (10 mg total) by mouth daily. 10/20/19   Minna Merritts, MD  famotidine (PEPCID) 20 MG tablet Take 1 tablet (20 mg total) by mouth 2 (two) times daily. Patient taking differently: Take 20 mg by mouth daily as needed.  12/26/17   Paulette Blanch, MD  furosemide (LASIX) 10 MG/ML injection Inject 4 mLs (40 mg total) into the vein every 12 (twelve) hours. 10/20/19   Nicole Kindred A, DO  heparin 25000-0.45 UT/250ML-% infusion Inject 1,150 Units/hr into the vein continuous. 10/20/19   Nicole Kindred A, DO  insulin aspart (NOVOLOG) 100 UNIT/ML injection Inject 0-15 Units into the skin 4 (four) times daily - after meals and at bedtime. 10/20/19   Nicole Kindred A, DO  insulin aspart (NOVOLOG) 100 UNIT/ML injection Inject 10  Units into the skin 3 (three) times daily with meals. 10/20/19   Nicole Kindred A, DO  insulin glargine (LANTUS) 100 UNIT/ML injection Inject 0.05 mLs (5 Units total) into the skin daily. 10/20/19   Ezekiel Slocumb, DO  levETIRAcetam (KEPPRA) 500 MG tablet Take 500 mg by mouth 3 (three) times daily. 10/03/19   [provider]  losartan (COZAAR) 100 MG tablet Take 100 mg by mouth daily. 05/11/19   [provider]  metFORMIN (GLUCOPHAGE) 500 MG tablet  Take 500 mg by mouth 2 (two) times daily with a meal.    [provider]  metoprolol succinate (TOPROL-XL) 100 MG 24 hr tablet Take 100 mg by mouth daily. 05/11/19   [provider]  nitroGLYCERIN (NITROSTAT) 0.4 MG SL tablet Place 0.4 mg under the tongue every 5 (five) minutes as needed for chest pain.    [provider]  oxyCODONE (OXY IR/ROXICODONE) 5 MG immediate release tablet Take 5 mg by mouth every 8 (eight) hours as needed for pain. 05/18/19   [provider]  pantoprazole (PROTONIX) 40 MG tablet Take 1 tablet (40 mg total) by mouth daily. 10/20/19   Ezekiel Slocumb, DO  pregabalin (LYRICA) 50 MG capsule Take 1 capsule (50 mg total) by mouth 3 (three) times daily. 10/20/19 10/19/20  Nicole Kindred A, DO  venlafaxine (EFFEXOR) 75 MG tablet Take 75 mg by mouth 3 (three) times daily. 05/11/19   [provider]     Critical care time:      Marianna Payment, D.O. [facility], PGY-1 Pager: [phone], Phone: [phone] Date 10/30/2019 Time 6:45 AM  PCCM attending:  59 year old gentleman with respiratory failure following CABG.  Was extubated to BiPAP.  Doing well now off BiPAP during the day.  Still with good urine output with diuresis.  Patient examined with residents today on rounds. Awake alert following commands No significant edema, regular rate rhythm S1-S2 No wheeze no crackles  Labs reviewed Assessment: Postop hypoxemic respiratory failure related to  pulmonary edema mixed restrictive physiology of the chest and obesity.  Plan: Continue BiPAP as needed nightly. Diuresis per primary service. Continue to mobilize as much as tolerated Up in chair as much as tolerated. Continue I-S to help with any component of atelectasis.  We appreciate the consultation.  Pulmonary will sign off at this time.  Please call with any questions or concerns were happy to help out in any way that we can.  Garner Nash, DO [facility] 10/30/2019 6:08 PM

## 2019-10-30 NOTE — Progress Notes (Signed)
Patient refuse Metaneb and aerosolized neb treatment this evening. Patient is now back into bed and stated that he wants to rest. Patient is clinically stable at this time. And saturations are stable. No complications noted.

## 2019-10-30 NOTE — Progress Notes (Signed)
Progress Note  Patient Name: Larry Terrell Date of Encounter: 10/30/2019  Primary Cardiologist:   Mertie Moores, MD   Subjective   BiPAP has been removed.  No difficulty breathing.  He is to have a swallow study later this morning.  He is pleading for water and ice.  Says his mouth is super dry.  Inpatient Medications    Scheduled Meds: . sodium chloride   Intravenous Once  . acetaminophen  1,000 mg Oral Q6H   Or  . acetaminophen (TYLENOL) oral liquid 160 mg/5 mL  1,000 mg Per Tube Q6H  . amiodarone  150 mg Intravenous Once  . aspirin  300 mg Rectal Daily  . atorvastatin  80 mg Oral q1800  . bisacodyl  10 mg Oral Daily   Or  . bisacodyl  10 mg Rectal Daily  . chlorhexidine  15 mL Mouth Rinse BID  . Chlorhexidine Gluconate Cloth  6 each Topical Daily  . colchicine  0.3 mg Oral BID  . docusate sodium  200 mg Oral Daily  . insulin aspart  0-24 Units Subcutaneous Q4H  . insulin glargine  10 Units Subcutaneous BID  . isosorbide dinitrate  20 mg Oral TID  . levalbuterol  0.63 mg Nebulization Q6H  . mouth rinse  15 mL Mouth Rinse q12n4p  . metolazone  5 mg Oral Once  . metoprolol tartrate  12.5 mg Oral BID   Or  . metoprolol tartrate  12.5 mg Per Tube BID  . omega-3 acid ethyl esters  1 g Oral BID  . pantoprazole (PROTONIX) IV  40 mg Intravenous Q24H  . pregabalin  50 mg Oral TID  . sodium chloride flush  10-40 mL Intracatheter Q12H  . sodium chloride flush  10-40 mL Intracatheter Q12H  . venlafaxine  75 mg Oral TID WC   Continuous Infusions: . sodium chloride Stopped (10/28/19 0953)  . sodium chloride    . sodium chloride 20 mL/hr at 10/26/19 1514  . amiodarone 30 mg/hr (10/30/19 0800)  . cefTRIAXone (ROCEPHIN)  IV Stopped (10/29/19 2046)  . furosemide (LASIX) infusion 10 mg/hr (10/30/19 0819)  . lactated ringers    . lactated ringers    . lactated ringers Stopped (10/29/19 2016)  . levETIRAcetam Stopped (10/30/19 0603)  . magnesium sulfate bolus IVPB 2 g  (10/30/19 0839)  . potassium chloride 10 mEq (10/30/19 0837)   PRN Meds: sodium chloride, acetaminophen, ALPRAZolam, dextrose, fentaNYL (SUBLIMAZE) injection, hydrALAZINE, ipratropium-albuterol, lactated ringers, metoprolol tartrate, nitroGLYCERIN, ondansetron (ZOFRAN) IV, oxyCODONE, sodium chloride flush, sodium chloride flush, traMADol   Vital Signs    Vitals:   10/30/19 0645 10/30/19 0700 10/30/19 0728 10/30/19 0800  BP: (!) 161/68 135/72  139/68  Pulse: 97   (!) 105  Resp: (!) 21 19  13   Temp:   99.5 F (37.5 C)   TempSrc:      SpO2: 97%   96%  Weight:      Height:        Intake/Output Summary (Last 24 hours) at 10/30/2019 0909 Last data filed at 10/30/2019 0800 Gross per 24 hour  Intake 1529.29 ml  Output 5645 ml  Net -4115.71 ml   Filed Weights   10/28/19 0630 10/29/19 0500 10/30/19 0431  Weight: 97.3 kg 97 kg 89 kg    Telemetry    NSR - Personally Reviewed  ECG    NSR, rate 103, axis WNL, diffuse ST elevation consistent with pericarditis. No myocardial injury pattern. - Personally Reviewed  Physical Exam  GEN:  Somewhat groggy but arousable and asking for water..   Neck: No  JVD Cardiac: RRR, no rub is heard.  Respiratory:  Clear anterior lung fields without wheezing Vascular: Lower extremity skin color is normal.  No cyanosis or evidence of ischemia GI:  Soft abdomen. MS:   Puffiness in hands and feet. Neuro:   Nonfocal   Labs    Chemistry Recent Labs  Lab 10/28/19 0342 10/28/19 0700 10/28/19 1627 10/28/19 1627 10/29/19 0321 10/29/19 1753 10/30/19 0613  NA 133*   < > 135   < > 134* 135 134*  K 5.4*   < > 4.6   < > 4.1 3.3* 3.1*  CL 102   < > 101   < > 98 95* 90*  CO2 20*   < > 21*   < > 23 27 29   GLUCOSE 187*   < > 235*   < > 263* 190* 241*  BUN 30*   < > 33*   < > 35* 36* 36*  CREATININE 2.52*   < > 2.45*   < > 2.29* 2.03* 1.98*  CALCIUM 8.2*   < > 8.2*   < > 8.5* 8.6* 8.8*  PROT 5.7*  --   --   --  5.9*  --  6.2*  ALBUMIN 3.2*   < >  3.0*  --  2.9*  --  2.8*  AST 22  --   --   --  18  --  16  ALT 20  --   --   --  20  --  18  ALKPHOS 50  --   --   --  53  --  63  BILITOT 1.4*  --   --   --  1.1  --  1.0  GFRNONAA 26*   < > 27*   < > 30* 34* 35*  GFRAA 31*   < > 32*   < > 34* 40* 41*  ANIONGAP 11   < > 13   < > 13 13 15    < > = values in this interval not displayed.     Hematology Recent Labs  Lab 10/28/19 0342 10/28/19 0700 10/28/19 1347 10/29/19 0321 10/30/19 0613  WBC 12.9*  --   --  14.7* 13.6*  RBC 3.04*  --   --  3.09* 3.25*  HGB 9.8*   < > 8.8* 9.8* 10.4*  HCT 28.9*   < > 26.0* 29.5* 31.4*  MCV 95.1  --   --  95.5 96.6  MCH 32.2  --   --  31.7 32.0  MCHC 33.9  --   --  33.2 33.1  RDW 18.8*  --   --  19.1* 18.0*  PLT 126*  --   --  128* 138*   < > = values in this interval not displayed.    Cardiac EnzymesNo results for input(s): TROPONINI in the last 168 hours. No results for input(s): TROPIPOC in the last 168 hours.   BNPNo results for input(s): BNP, PROBNP in the last 168 hours.   DDimer No results for input(s): DDIMER in the last 168 hours.   Radiology    DG Chest Port 1 View  Result Date: 10/30/2019 CLINICAL DATA:  Bypass surgery. EXAM: PORTABLE CHEST 1 VIEW COMPARISON:  10/29/2019 FINDINGS: The right IJ Cordis is stable. The right PICC line is stable. Stable cardiac enlargement. Improved bibasilar aeration with resolving atelectasis and effusions. No pneumothorax. IMPRESSION: 1. Stable support apparatus.  2. Improved bibasilar lung aeration. Electronically Signed   By: Marijo Sanes M.D.   On: 10/30/2019 08:38   DG CHEST PORT 1 VIEW  Result Date: 10/29/2019 CLINICAL DATA:  Post CABG. Right PICC placement. EXAM: PORTABLE CHEST 1 VIEW COMPARISON:  Radiograph earlier this day. FINDINGS: Tip of the right upper extremity PICC at the atrial caval junction. Right internal jugular sheath remains in place. Post median sternotomy and CABG. Cardiomegaly is stable. No coronary stent is visualized.  Peribronchial and interstitial thickening consistent with pulmonary edema, unchanged. Opacity in the right mid lung may represent focal airspace disease or fluid in the fissure. Small bilateral pleural effusions. No pneumothorax. IMPRESSION: 1. Tip of the right upper extremity PICC at the atrial caval junction. No pneumothorax. 2. Unchanged cardiomegaly and pulmonary edema. Small pleural effusions. 3. Right mid lung opacity may represent airspace disease such as atelectasis versus fluid tracking in the fissure. Electronically Signed   By: Keith Rake M.D.   On: 10/29/2019 17:41   DG Chest Port 1 View  Result Date: 10/29/2019 CLINICAL DATA:  CABG and chest pain EXAM: PORTABLE CHEST 1 VIEW COMPARISON:  1 day prior FINDINGS: Removal of Swan-Ganz catheter and mediastinal drain. A right IJ Cordis sheath remains in place. Prior median sternotomy. Lung volumes remain low. Cardiomegaly accentuated by AP portable technique. Bilateral chest tubes have been removed. Probable small right pleural effusion. No pneumothorax. Moderate interstitial edema, similar. Improved bibasilar airspace disease. IMPRESSION: Removal of support apparatus, including chest tubes. No pneumothorax. Minimally improved aeration with congestive heart failure and decreased bibasilar atelectasis. Electronically Signed   By: Abigail Miyamoto M.D.   On: 10/29/2019 10:31   [place] EKG SITE RITE  Result Date: 10/29/2019 If Site Rite image not attached, placement could not be confirmed due to current cardiac rhythm.   Cardiac Studies   Echo:    1. Left ventricular ejection fraction, by estimation, is 55%. The left  ventricle has low normal function. Grossly, no significant wall motion  abnormality. There is mild left ventricular hypertrophy of the septal  segment. Grade II diastolic dysfunction.  2. Right ventricular systolic function is normal. The right ventricular  size is normal. Tricuspid regurgitation signal is inadequate for assessing    PA pressure.  3. Left atrial size was moderately dilated.   Patient Profile     60 y.o. male hx of CAD s/p RCA and LCx stenting,chronic diastolic heart failure, PAD s/paortobifemoral bypass in 2003 with known stenosis of the bilateral SFAs,TIA,DM2 with diabetic polyneuropathy, HTN, HLD,seizure disorder, chronic back pain,and prior tobacco usewhowas [facility] with left shoulder pain concerning for anginal equivalent and underwent diagnostic LHC which revealed severe multi-vessel CAD with recommendation for transfer to [facility] for evaluation of CABG.successful CABG with LIMA to LAD, pedicled RIMA to PDA, SVG to distal circumflex, and SVG sequential to first diagonal and first obtuse marginal 4/15/2 2021.  Assessment & Plan    1. CAD/CABG: Successful 5 vessel coronary bypass grafting.  No recurrent angina or suspicion of graft occlusion. 2. Chronic diastolic heart failure: Some volume overload after surgery, now diuresed, extubated, and off BiPAP. 3. Acute on chronic kidney disease stage IV: Worsening creatinines after contrast exposure and subsequent bypass surgery.  There is a creatinine has crested on 10/28/2019 with creatinine of 2.52, now down to 1.98 on 10/29/2019. 4. Hyperlipidemia: Target going forward should be less than 70 and therapy should include high intensity statin therapy, Zetia, and PCSK9 if unable to hit target.  5. Postoperative atrial fibrillation: Currently resolved, in sinus rhythm.  IV amiodarone is currently infusing.  Will likely be switched to p.o. amiodarone post swallow evaluation if he passes.   For questions or updates, please contact Vander Please consult [email or web address] for contact info under Cardiology/STEMI.   Signed, Sinclair Grooms, MD  10/30/2019, 9:09 AM

## 2019-10-30 NOTE — Plan of Care (Signed)
  Problem: Clinical Measurements: Goal: Ability to maintain clinical measurements within normal limits will improve Outcome: Progressing Goal: Will remain free from infection Outcome: Progressing Goal: Diagnostic test results will improve Outcome: Progressing Goal: Respiratory complications will improve Outcome: Progressing Goal: Cardiovascular complication will be avoided Outcome: Progressing   Problem: Activity: Goal: Risk for activity intolerance will decrease Outcome: Progressing   Problem: Elimination: Goal: Will not experience complications related to bowel motility Outcome: Progressing Goal: Will not experience complications related to urinary retention Outcome: Progressing   Problem: Pain Managment: Goal: General experience of comfort will improve Outcome: Progressing   Problem: Respiratory: Goal: Respiratory status will improve Outcome: Progressing

## 2019-10-30 NOTE — Evaluation (Signed)
Clinical/Bedside Swallow Evaluation Patient Details  Name: Larry Terrell MRN: [id number] Date of Birth: 06/17/1961  Today's Date: 10/30/2019 Time: SLP Start Time (ACUTE ONLY): 0946 SLP Stop Time (ACUTE ONLY): 1010 SLP Time Calculation (min) (ACUTE ONLY): 24 min  Past Medical History:  Past Medical History:  Diagnosis Date  . CHF (congestive heart failure) (HCC)    diastolic (echo at [facility] 7026) EF 55-60%  . Cirrhosis ([place])    NASH per records  . COPD (chronic obstructive pulmonary disease) ([place])   . Depression   . Diabetes mellitus with neuropathy ([place])   . Heart attack ([place])   . Neuropathy   . OSA (obstructive sleep apnea)   . Peripheral vascular disease due to secondary diabetes mellitus ([place])   . Splenomegaly   . 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 TIA (transient ischemic attack) April 2016   Past Surgical History:  Past Surgical History:  Procedure Laterality Date  . AORTA - FEMORAL ARTERY BYPASS GRAFT Bilateral   . CARDIAC CATHETERIZATION    . cardiac stents  July 2015   . CORONARY ARTERY BYPASS GRAFT N/A 10/26/2019   Procedure: CORONARY ARTERY BYPASS GRAFTING (CABG) x 5, Using Bilateral mammary arteries, and right leg greater saphenous vein harvested endoscopically;  Surgeon: Wonda Olds, MD;  Location: [place];  Service: Open Heart Surgery;  Laterality: N/A;  . CORONARY STENT INTERVENTION N/A 08/09/2017   Procedure: CORONARY STENT INTERVENTION;  Surgeon: Wellington Hampshire, MD;  Location: [place] CV [facility];  Service: Cardiovascular;  Laterality: N/A;  . LAPAROSCOPIC APPENDECTOMY N/A 07/10/2015   Procedure: APPENDECTOMY LAPAROSCOPIC;  Surgeon: Rolm Bookbinder, MD;  Location: [place];  Service: General;  Laterality: N/A;  . LEFT HEART CATH AND CORONARY ANGIOGRAPHY N/A 08/09/2017   Procedure: LEFT HEART CATH AND CORONARY ANGIOGRAPHY;  Surgeon: Wellington Hampshire, MD;  Location: [place] CV LAB;  Service: Cardiovascular;  Laterality: N/A;  . LEFT HEART CATH AND  CORONARY ANGIOGRAPHY N/A 10/19/2019   Procedure: LEFT HEART CATH AND CORONARY ANGIOGRAPHY;  Surgeon: Minna Merritts, MD;  Location: [place] [facility];  Service: Cardiovascular;  Laterality: N/A;  . RADIAL ARTERY HARVEST Left 10/26/2019   Procedure: RADIAL ARTERY HARVEST;  Surgeon: Wonda Olds, MD;  Location: [place];  Service: Open Heart Surgery;  Laterality: Left;  . TEE WITHOUT CARDIOVERSION N/A 10/26/2019   Procedure: TRANSESOPHAGEAL ECHOCARDIOGRAM (TEE);  Surgeon: Wonda Olds, MD;  Location: [place];  Service: Open Heart Surgery;  Laterality: N/A;   HPI:  59 y.o. male hx of CAD s/p RCA and LCx stenting, chronic diastolic heart failure, PAD s/p aortobifemoral bypass in 2003 with known stenosis of the bilateral SFAs, TIA, DM2 with diabetic polyneuropathy, HTN, HLD, seizure disorder, chronic back pain, and prior tobacco use who was evaluated at [facility] on 4/8 with left shoulder pain concerning for anginal equivalent and underwent diagnostic LHC which revealed severe multi-vessel CAD with recommendation for transfer to [facility] for evaluation of CABG. successful CABG with LIMA to LAD, pedicled RIMA to PDA, SVG to distal circumflex, and SVG sequential to first diagonal and first obtuse marginal 4/15/2 2021. Extubated 4/16 to Bipap.    Assessment / Plan / Recommendation Clinical Impression  Patient presents with a largely normal oropharyngeal swallow. Oral phase with swift transit of bolus and seemingly normal swallow initiation. No overt s/s of aspiration observed despite patient being challenged with 3-4 ounces of water consumed via consecutive straw sips as well as pills consumed under RN supervision  with thin liquid. Intermittent coughing noted in between po trials although appearing to be related to chest congestion, as vocal quality remaining clear throughout evaluation. Patient may initiate a mechanical soft diet due to missing dentition, thin liquid. SLP will f/u for tolerance as there has  been a question of aspiration in the past.   SLP Visit Diagnosis: Dysphagia, unspecified (R13.10)    Aspiration Risk  Mild aspiration risk    Diet Recommendation Dysphagia 3 (Mech soft);Thin liquid   Liquid Administration via: Cup;Straw Medication Administration: Whole meds with liquid Supervision: Staff to assist with self feeding;Full supervision/cueing for compensatory strategies Compensations: Slow rate;Small sips/bites Postural Changes: Seated upright at 90 degrees    Other  Recommendations Oral Care Recommendations: Oral care BID   Follow up Recommendations None      Frequency and Duration min 1 x/week  1 week       Prognosis Prognosis for Safe Diet Advancement: Good      Swallow Study   General HPI: 59 y.o. male hx of CAD s/p RCA and LCx stenting, chronic diastolic heart failure, PAD s/p aortobifemoral bypass in 2003 with known stenosis of the bilateral SFAs, TIA, DM2 with diabetic polyneuropathy, HTN, HLD, seizure disorder, chronic back pain, and prior tobacco use who was evaluated at [facility] on 4/8 with left shoulder pain concerning for anginal equivalent and underwent diagnostic LHC which revealed severe multi-vessel CAD with recommendation for transfer to [facility] for evaluation of CABG. successful CABG with LIMA to LAD, pedicled RIMA to PDA, SVG to distal circumflex, and SVG sequential to first diagonal and first obtuse marginal 4/15/2 2021. Extubated 4/16 to Bipap.  Type of Study: Bedside Swallow Evaluation Previous Swallow Assessment: none, screened in 2020 but ultimately no SLP needs indicated.  Diet Prior to this Study: NPO Temperature Spikes Noted: No Respiratory Status: Nasal cannula History of Recent Intubation: Yes Length of Intubations (days): 1 days Date extubated: 10/27/19 Behavior/Cognition: Alert;Cooperative;Requires cueing Oral Cavity Assessment: Within Functional Limits Oral Care Completed by SLP: Recent completion by staff Oral Cavity - Dentition:  Missing dentition;Poor condition Vision: Functional for self-feeding Self-Feeding Abilities: Needs assist Patient Positioning: Upright in chair Baseline Vocal Quality: Normal Volitional Cough: Strong;Congested Volitional Swallow: Able to elicit    Oral/Motor/Sensory Function Overall Oral Motor/Sensory Function: Within functional limits   Ice Chips Ice chips: Within functional limits   Thin Liquid Thin Liquid: Within functional limits Presentation: Cup;Straw    Nectar Thick Nectar Thick Liquid: Not tested   Honey Thick Honey Thick Liquid: Not tested   Puree Puree: Within functional limits Presentation: Spoon   Solid     Solid: Within functional limits     Shaquisha Wynn MA, CCC-SLP   Annisha Baar Meryl 10/30/2019,10:19 AM

## 2019-10-30 NOTE — TOC Initial Note (Signed)
Transition of Care [facility]) - Initial/Assessment Note    Patient Details  Name: Larry Terrell MRN: [id number] Date of Birth: [birth year]  Transition of Care [facility]) CM/SW Contact:    Trula Ore, [place] Phone Number: 10/30/2019, 3:59 PM  Clinical Narrative:            CSW spoke with patient at bedside. Patient was agreeable to SNF placement. Patient wants CSW to fax out initial referral to [place] area.  Pending bed offers for SNF placement. CSW will continue to follow.         Expected Discharge Plan: Skilled Nursing Facility Barriers to Discharge: Continued Medical Work up   Patient Goals and CMS Choice Patient states their goals for this hospitalization and ongoing recovery are:: to go to skilled nursing facility CMS Medicare.gov Compare Post Acute Care list provided to:: Patient Choice offered to / list presented to : Patient  Expected Discharge Plan and Services Expected Discharge Plan: [place]                                              Prior Living Arrangements/Services     Patient language and need for interpreter reviewed:: Yes Do you feel safe going back to the place where you live?: No   SNFs  Need for Family Participation in Patient Care: Yes (Comment) Care giver support system in place?: Yes (comment)   Criminal Activity/Legal Involvement Pertinent to Current Situation/Hospitalization: No - Comment as needed  Activities of Daily Living Home Assistive Devices/Equipment: Environmental consultant (specify type), Wheelchair ADL Screening (condition at time of admission) Patient's cognitive ability adequate to safely complete daily activities?: Yes Is the patient deaf or have difficulty hearing?: Yes Does the patient have difficulty seeing, even when wearing glasses/contacts?: No Does the patient have difficulty concentrating, remembering, or making decisions?: No Patient able to express need for assistance with ADLs?: Yes Does the patient have  difficulty dressing or bathing?: No Independently performs ADLs?: Yes (appropriate for developmental age) Does the patient have difficulty walking or climbing stairs?: Yes Weakness of Legs: Both Weakness of Arms/Hands: None  Permission Sought/Granted Permission sought to share information with : Case Manager, Customer service manager, Family Supports                Emotional Assessment Appearance:: Appears stated age Attitude/Demeanor/Rapport: Gracious Affect (typically observed): Calm Orientation: : Oriented to Self, Oriented to Place, Oriented to  Time, Oriented to Situation Alcohol / Substance Use: Not Applicable Psych Involvement: No (comment)  Admission diagnosis:  Unstable angina ([place]) [I20.0] Patient Active Problem List   Diagnosis Date Noted  . Open harvest of left radial artery 10/27/2019  . S/P CABG x 5   . CAD in native artery 10/20/2019  . Unstable angina ([place]) 10/20/2019  . (HFpEF) heart failure with preserved ejection fraction ([place]) 10/20/2019  . Essential hypertension 10/20/2019  . Hyperlipidemia LDL goal <70 10/20/2019  . PAD (peripheral artery disease) ([place]) 10/20/2019  . Chest pain 10/18/2019  . Altered mental status 06/04/2019  . Sepsis ([place]) 06/04/2019  . AKI (acute kidney injury) ([place]) 06/04/2019  . GERD (gastroesophageal reflux disease) 06/04/2019  . History of seizure 06/04/2019  . Stroke ([place]) 04/01/2019  . Acute appendicitis 07/10/2015  . S/P laparoscopic appendectomy 07/10/2015  . Appendicitis, acute   . Diabetes mellitus with complication ([place])   . Chronic congestive heart failure with  left ventricular diastolic dysfunction ([place])   . Lactic acidosis   . Acute respiratory failure with hypoxia [facility])    PCP:  Medicine, [place]:   [place] (N), [place]) [place] [address] Phone: [phone] Fax: [phone]     Social  Determinants of Health (SDOH) Interventions    Readmission Risk Interventions No flowsheet data found.

## 2019-10-30 NOTE — Progress Notes (Signed)
VAST RN spoke with pt's nurse, Minette Brine. She stated she is aware of discontinue CL order and will take care of it.

## 2019-10-31 DIAGNOSIS — I480 Paroxysmal atrial fibrillation: Secondary | ICD-10-CM

## 2019-10-31 LAB — COMPREHENSIVE METABOLIC PANEL
ALT: 18 U/L (ref 0–44)
AST: 20 U/L (ref 15–41)
Albumin: 2.5 g/dL — ABNORMAL LOW (ref 3.5–5.0)
Alkaline Phosphatase: 56 U/L (ref 38–126)
Anion gap: 15 (ref 5–15)
BUN: 46 mg/dL — ABNORMAL HIGH (ref 8–23)
CO2: 32 mmol/L (ref 22–32)
Calcium: 8.2 mg/dL — ABNORMAL LOW (ref 8.9–10.3)
Chloride: 87 mmol/L — ABNORMAL LOW (ref 98–111)
Creatinine, Ser: 2.22 mg/dL — ABNORMAL HIGH (ref 0.61–1.24)
GFR calc Af Amer: 36 mL/min — ABNORMAL LOW (ref >60)
GFR calc non Af Amer: 31 mL/min — ABNORMAL LOW (ref >60)
Glucose, Bld: 171 mg/dL — ABNORMAL HIGH (ref 70–99)
Potassium: 2.8 mmol/L — ABNORMAL LOW (ref 3.5–5.1)
Sodium: 134 mmol/L — ABNORMAL LOW (ref 135–145)
Total Bilirubin: 0.7 mg/dL (ref 0.3–1.2)
Total Protein: 5.7 g/dL — ABNORMAL LOW (ref 6.5–8.1)

## 2019-10-31 LAB — GLUCOSE, CAPILLARY
Glucose-Capillary: 129 mg/dL — ABNORMAL HIGH (ref 70–99)
Glucose-Capillary: 135 mg/dL — ABNORMAL HIGH (ref 70–99)
Glucose-Capillary: 138 mg/dL — ABNORMAL HIGH (ref 70–99)
Glucose-Capillary: 196 mg/dL — ABNORMAL HIGH (ref 70–99)
Glucose-Capillary: 199 mg/dL — ABNORMAL HIGH (ref 70–99)
Glucose-Capillary: 229 mg/dL — ABNORMAL HIGH (ref 70–99)

## 2019-10-31 LAB — CULTURE, RESPIRATORY W GRAM STAIN
Culture: NORMAL
Special Requests: NORMAL

## 2019-10-31 LAB — ECHO INTRAOPERATIVE TEE
Height: 67 in
Weight: 3200 oz

## 2019-10-31 MED ORDER — LEVETIRACETAM 500 MG PO TABS
500.0000 mg | ORAL_TABLET | Freq: Three times a day (TID) | ORAL | Status: DC
  Administered 2019-10-31 – 2019-11-06 (×19): 500 mg via ORAL
  Filled 2019-10-31 (×12): qty 1
  Filled 2019-10-31: qty 2
  Filled 2019-10-31 (×6): qty 1

## 2019-10-31 MED ORDER — POTASSIUM CHLORIDE 10 MEQ/50ML IV SOLN
10.0000 meq | INTRAVENOUS | Status: AC
  Administered 2019-10-31 (×4): 10 meq via INTRAVENOUS
  Filled 2019-10-31 (×5): qty 50

## 2019-10-31 MED ORDER — METOPROLOL TARTRATE 25 MG/10 ML ORAL SUSPENSION
25.0000 mg | Freq: Two times a day (BID) | ORAL | Status: DC
  Filled 2019-10-31: qty 10

## 2019-10-31 MED ORDER — PANTOPRAZOLE SODIUM 40 MG PO TBEC
40.0000 mg | DELAYED_RELEASE_TABLET | Freq: Every day | ORAL | Status: DC
  Administered 2019-11-01 – 2019-11-06 (×6): 40 mg via ORAL
  Filled 2019-10-31 (×6): qty 1

## 2019-10-31 MED ORDER — AMIODARONE HCL IN DEXTROSE 360-4.14 MG/200ML-% IV SOLN
30.0000 mg/h | INTRAVENOUS | Status: DC
  Administered 2019-11-01: 30 mg/h via INTRAVENOUS

## 2019-10-31 MED ORDER — METOPROLOL TARTRATE 25 MG PO TABS
25.0000 mg | ORAL_TABLET | Freq: Two times a day (BID) | ORAL | Status: DC
  Administered 2019-10-31 (×2): 25 mg via ORAL
  Filled 2019-10-31 (×2): qty 1

## 2019-10-31 MED ORDER — APIXABAN 5 MG PO TABS
5.0000 mg | ORAL_TABLET | Freq: Two times a day (BID) | ORAL | Status: DC
  Administered 2019-10-31 – 2019-11-06 (×13): 5 mg via ORAL
  Filled 2019-10-31 (×13): qty 1

## 2019-10-31 MED ORDER — POTASSIUM CHLORIDE 10 MEQ/50ML IV SOLN
10.0000 meq | INTRAVENOUS | Status: AC
  Administered 2019-10-31 (×2): 10 meq via INTRAVENOUS
  Filled 2019-10-31: qty 50

## 2019-10-31 MED FILL — Potassium Chloride Inj 2 mEq/ML: INTRAVENOUS | Qty: 40 | Status: AC

## 2019-10-31 MED FILL — Heparin Sodium (Porcine) Inj 1000 Unit/ML: INTRAMUSCULAR | Qty: 30 | Status: AC

## 2019-10-31 MED FILL — Magnesium Sulfate Inj 50%: INTRAMUSCULAR | Qty: 10 | Status: AC

## 2019-10-31 NOTE — Progress Notes (Signed)
Pt continues to refuse breathing treatments and metaneb at this time. Pt on 6L nasal cannula, no distress noted. RT will continue to monitor.

## 2019-10-31 NOTE — Progress Notes (Signed)
   He is in atrial fibrillation with controlled rate.  On IV amiodarone.  Hopefully there will be spontaneous conversion over the next 24 to 48 hours.  He is on apixaban.  Creatinine is slightly less than yesterday.  Hopefully will continue to improve.  There is a mild level of confusion.  He is eating.  Improved compared to yesterday.

## 2019-10-31 NOTE — Progress Notes (Signed)
[place] Progress Note Patient Name: Detavious Rinn DOB: 1960-08-02 MRN: [id number]   Date of Service  10/31/2019  HPI/Events of Note  Hypokalemia - K+ = 2.8 and Creatinine = 2.22.  eICU Interventions  Will replace K+.      Intervention Category Major Interventions: Electrolyte abnormality - evaluation and management  Tyrice Hewitt [place] 10/31/2019, 4:45 AM

## 2019-10-31 NOTE — Progress Notes (Addendum)
Inpatient Rehabilitation Admissions Coordinator  Inpatient rehab consult received. Note therapy is recommending SNF. Patient somewhat confused to place and situation. I will contact his brother, [name], for whom he lives with, to discuss rehab venues and pt options.  Danne Baxter, RN, MSN Rehab Admissions Coordinator [phone] 10/31/2019 11:11 AM   Brother to be here at hospital to visit in about an hour and I will return to discuss his options upon his arrival.  I met with patient with his brother [name] and sister at bedside. Patient is most appropriate for SNF rehab at this time for prolonged recovery . They are in agreement and [name] would like SW to contact him to arrange, I will alert SW and we will sign off at this time.  Danne Baxter, RN, MSN Rehab Admissions Coordinator [phone] 10/31/2019 12:33 PM

## 2019-10-31 NOTE — Plan of Care (Signed)
  Problem: Clinical Measurements: Goal: Cardiovascular complication will be avoided Outcome: Progressing   Problem: Coping: Goal: Level of anxiety will decrease Outcome: Progressing   Problem: Elimination: Goal: Will not experience complications related to bowel motility Outcome: Progressing   Problem: Pain Managment: Goal: General experience of comfort will improve Outcome: Progressing   Problem: Safety: Goal: Ability to remain free from injury will improve Outcome: Progressing   Problem: Skin Integrity: Goal: Risk for impaired skin integrity will decrease Outcome: Progressing   Problem: Clinical Measurements: Goal: Postoperative complications will be avoided or minimized Outcome: Progressing   Problem: Skin Integrity: Goal: Wound healing without signs and symptoms of infection Outcome: Progressing

## 2019-10-31 NOTE — Progress Notes (Signed)
EVENING ROUNDS NOTE :     [address]       [place] [address]             [phone]                 5 Days Post-Op Procedure(s) (LRB): CORONARY ARTERY BYPASS GRAFTING (CABG) x 5, Using Bilateral mammary arteries, and right leg greater saphenous vein harvested endoscopically (N/A) RADIAL ARTERY HARVEST (Left) TRANSESOPHAGEAL ECHOCARDIOGRAM (TEE) (N/A) INDOCYANINE GREEN FLUORESCENCE IMAGING (ICG) (N/A)  Total Length of Stay:  LOS: 11 days  BP 118/77 (BP Location: Left Arm)   Pulse 90   Temp 97.8 F (36.6 C) (Oral)   Resp (!) 21   Ht 5' 7"  (1.702 m)   Wt 89.4 kg   SpO2 96%   BMI 30.87 kg/m   .Intake/Output      04/20 0701 - 04/21 0700   I.V. (mL/kg) 290 (3.2)   IV Piggyback 261.7   Total Intake(mL/kg) 551.6 (6.2)   Urine (mL/kg/hr) 1825 (1.6)   Stool    Total Output 1825   Net -1273.4         . sodium chloride 20 mL/hr at 10/26/19 1514  . furosemide (LASIX) infusion 10 mg/hr (10/31/19 1800)     Lab Results  Component Value Date   WBC 13.6 (H) 10/30/2019   HGB 10.4 (L) 10/30/2019   HCT 31.4 (L) 10/30/2019   PLT 138 (L) 10/30/2019   GLUCOSE 171 (H) 10/31/2019   CHOL 160 10/19/2019   TRIG 482 (H) 10/19/2019   HDL 23 (L) 10/19/2019   LDLDIRECT 72.0 10/19/2019   LDLCALC UNABLE TO CALCULATE IF TRIGLYCERIDE OVER 400 mg/dL 10/19/2019   ALT 18 10/31/2019   AST 20 10/31/2019   NA 134 (L) 10/31/2019   K 2.8 (L) 10/31/2019   CL 87 (L) 10/31/2019   CREATININE 2.22 (H) 10/31/2019   BUN 46 (H) 10/31/2019   CO2 32 10/31/2019   TSH 0.176 (L) 12/15/2013   INR 1.4 (H) 10/26/2019   HGBA1C 7.3 (H) 10/18/2019   1 In and out of afib , conts on amio gtt 2 otherwise remains stable 3 working on SNF placement when ready for discharge  John Giovanni, PA-C

## 2019-10-31 NOTE — TOC Progression Note (Addendum)
Transition of Care [facility]) - Progression Note    Patient Details  Name: Larry Terrell MRN: [id number] Date of Birth: [birth year]  Transition of Care [facility]) CM/SW [place], [place] Phone Number: 10/31/2019, 1:57 PM  Clinical Narrative:      **Update 3:03pm 4/20- [facility] called CSW to inform her that they cannot accept patients insurance. CSW will offer patient other SNF choice.Still waiting on call back from son [name].  CSW will continue to follow.  CSW spoke with patient at bedside. Patient was agreeable to SNF placement at [facility]. CSW tried to call son [name] in regards to SNF placement. CSW is waiting on patients son [name] to call back.   CSW will call SNF choice to make sure we start insurance authorization for patient.  CSW will continue to follow.   Expected Discharge Plan: Skilled Nursing Facility Barriers to Discharge: Continued Medical Work up  Expected Discharge Plan and Services Expected Discharge Plan: [place]                                               Social Determinants of Health (SDOH) Interventions    Readmission Risk Interventions No flowsheet data found.

## 2019-10-31 NOTE — Progress Notes (Signed)
  Speech Language Pathology Treatment: Dysphagia  Patient Details Name: Larry Terrell MRN: [id number] DOB: [birth year] Today's Date: 10/31/2019 Time: 3664-4034 SLP Time Calculation (min) (ACUTE ONLY): 9 min  Assessment / Plan / Recommendation Clinical Impression  F/u diet tolerance assessment revealed excellent intake of dysphagia 3 solids and thin liquids. No overt indication of aspiration, normal mastication of bolus. SLP provided min verbal cueing for small sips of thin liquid to aid in safest intake although patient without s/s of aspiration today even with multiple consecutive straw sips. No SLP f/u indicated. Dysphagia 3 textures remain appropriate as patient is missing dentition as well as chopped solids will aid in independence with self feeding.    HPI HPI: 59 y.o. male hx of CAD s/p RCA and LCx stenting, chronic diastolic heart failure, PAD s/p aortobifemoral bypass in 2003 with known stenosis of the bilateral SFAs, TIA, DM2 with diabetic polyneuropathy, HTN, HLD, seizure disorder, chronic back pain, and prior tobacco use who was evaluated at [facility] on 4/8 with left shoulder pain concerning for anginal equivalent and underwent diagnostic LHC which revealed severe multi-vessel CAD with recommendation for transfer to [facility] for evaluation of CABG. successful CABG with LIMA to LAD, pedicled RIMA to PDA, SVG to distal circumflex, and SVG sequential to first diagonal and first obtuse marginal 4/15/2 2021. Extubated 4/16 to [facility].       SLP Plan  All goals met;Discharge SLP treatment due to (comment)       Recommendations  Diet recommendations: Dysphagia 3 (mechanical soft);Thin liquid Liquids provided via: Cup;Straw Medication Administration: Whole meds with liquid Supervision: Patient able to self feed Compensations: Slow rate;Small sips/bites Postural Changes and/or Swallow Maneuvers: Seated upright 90 degrees                Oral Care Recommendations: Oral care BID Follow up  Recommendations: None SLP Visit Diagnosis: Dysphagia, unspecified (R13.10) Plan: All goals met;Discharge SLP treatment due to (comment)       GO             Gabriel Rainwater MA, CCC-SLP     Robbin Loughmiller Meryl 10/31/2019, 9:53 AM

## 2019-10-31 NOTE — Plan of Care (Signed)

## 2019-11-01 ENCOUNTER — Inpatient Hospital Stay (HOSPITAL_COMMUNITY): Payer: Medicare HMO

## 2019-11-01 LAB — COMPREHENSIVE METABOLIC PANEL
ALT: 19 U/L (ref 0–44)
AST: 21 U/L (ref 15–41)
Albumin: 2.4 g/dL — ABNORMAL LOW (ref 3.5–5.0)
Alkaline Phosphatase: 63 U/L (ref 38–126)
Anion gap: 13 (ref 5–15)
BUN: 42 mg/dL — ABNORMAL HIGH (ref 8–23)
CO2: 34 mmol/L — ABNORMAL HIGH (ref 22–32)
Calcium: 8.1 mg/dL — ABNORMAL LOW (ref 8.9–10.3)
Chloride: 83 mmol/L — ABNORMAL LOW (ref 98–111)
Creatinine, Ser: 2.11 mg/dL — ABNORMAL HIGH (ref 0.61–1.24)
GFR calc Af Amer: 38 mL/min — ABNORMAL LOW (ref >60)
GFR calc non Af Amer: 33 mL/min — ABNORMAL LOW (ref >60)
Glucose, Bld: 180 mg/dL — ABNORMAL HIGH (ref 70–99)
Potassium: 2.7 mmol/L — CL (ref 3.5–5.1)
Sodium: 130 mmol/L — ABNORMAL LOW (ref 135–145)
Total Bilirubin: 0.9 mg/dL (ref 0.3–1.2)
Total Protein: 5.7 g/dL — ABNORMAL LOW (ref 6.5–8.1)

## 2019-11-01 LAB — GLUCOSE, CAPILLARY
Glucose-Capillary: 117 mg/dL — ABNORMAL HIGH (ref 70–99)
Glucose-Capillary: 115 mg/dL — ABNORMAL HIGH (ref 70–99)
Glucose-Capillary: 195 mg/dL — ABNORMAL HIGH (ref 70–99)
Glucose-Capillary: 141 mg/dL — ABNORMAL HIGH (ref 70–99)
Glucose-Capillary: 167 mg/dL — ABNORMAL HIGH (ref 70–99)
Glucose-Capillary: 120 mg/dL — ABNORMAL HIGH (ref 70–99)

## 2019-11-01 MED ORDER — POTASSIUM CHLORIDE 10 MEQ/100ML IV SOLN
10.0000 meq | INTRAVENOUS | Status: AC
  Administered 2019-11-01 (×3): 10 meq via INTRAVENOUS
  Filled 2019-11-01 (×3): qty 100

## 2019-11-01 MED ORDER — ASPIRIN EC 81 MG PO TBEC
81.0000 mg | DELAYED_RELEASE_TABLET | Freq: Every day | ORAL | Status: DC
  Administered 2019-11-01 – 2019-11-06 (×6): 81 mg via ORAL
  Filled 2019-11-01 (×6): qty 1

## 2019-11-01 MED ORDER — ENOXAPARIN SODIUM 30 MG/0.3ML ~~LOC~~ SOLN: 30.0000 mg | SUBCUTANEOUS | Status: DC

## 2019-11-01 MED ORDER — AMIODARONE HCL 200 MG PO TABS
400.0000 mg | ORAL_TABLET | Freq: Two times a day (BID) | ORAL | Status: DC
  Administered 2019-11-01 – 2019-11-05 (×10): 400 mg via ORAL
  Filled 2019-11-01 (×10): qty 2

## 2019-11-01 MED ORDER — METOPROLOL TARTRATE 25 MG/10 ML ORAL SUSPENSION
37.5000 mg | Freq: Two times a day (BID) | ORAL | Status: DC
  Administered 2019-11-03 – 2019-11-05 (×3): 37.5 mg
  Filled 2019-11-01 (×3): qty 15

## 2019-11-01 MED ORDER — METOPROLOL TARTRATE 25 MG PO TABS
37.5000 mg | ORAL_TABLET | Freq: Two times a day (BID) | ORAL | Status: DC
  Administered 2019-11-01 – 2019-11-06 (×8): 37.5 mg via ORAL
  Filled 2019-11-01 (×11): qty 1

## 2019-11-01 MED ORDER — FUROSEMIDE 10 MG/ML IJ SOLN
80.0000 mg | Freq: Two times a day (BID) | INTRAMUSCULAR | Status: DC
  Administered 2019-11-01 – 2019-11-02 (×4): 80 mg via INTRAVENOUS
  Filled 2019-11-01 (×4): qty 8

## 2019-11-01 NOTE — Plan of Care (Signed)
  Problem: Education: Goal: Knowledge of General Education information will improve Description Including pain rating scale, medication(s)/side effects and non-pharmacologic comfort measures Outcome: Progressing   

## 2019-11-01 NOTE — Progress Notes (Signed)
      [address]       [place] [address]             [phone]       Pre: A little confused this morning. Understands procedure.   Procedure: Skin prepped with betadine swabstick. Cut epicardial pacing wires at the skin. Patient is to lay flat for an hour. Patient verbalized understanding.   Post: no complications.    Nicholes Rough, PA-C

## 2019-11-01 NOTE — Progress Notes (Signed)
Reviewed chart, noted pt is unable to ambulate yet. Will follow progress with PT. Yves Dill CES, ACSM 9:59 AM 11/01/2019

## 2019-11-01 NOTE — Progress Notes (Signed)
Physical Therapy Treatment Patient Details Name: Larry Terrell MRN: [id number] DOB: 04-09-1961 Today's Date: 11/01/2019    History of Present Illness 59 y.o. male with a known history of CAD, MI and PCI with 2 stents, diastolic CHF, COPD, depression, DM, HTN. Pt admitted with acute onset of midsternal chest pain, In ED EKG showed Sinus rhythm with a rate of 80 with first-degree AV block and poor R wave progression with T wave inversion inferiorly and laterally. Pt admitted 10/20/19 and is s/p CABGx5 10/26/19.    PT Comments    Pt is slowly progressing with functional mobility and activity tolerance. He required maxA +2 for bed mobility to maintain sternal precautions. He required modA +2 to stand for ~15sec with Bil knee block due to pts tendency to buckle. Pt unable to take any steps. Reviewed sternal precautions throughout session as pt could not recall them. Hopeful to progress mobility and activity tolerance with improved cognition. Pt would continue to benefit from skilled physical therapy services at this time while admitted and after d/c to address the below listed limitations in order to improve overall safety and independence with functional mobility.   Follow Up Recommendations  SNF     Equipment Recommendations  Other (comment)(defer to next venue of care)    Recommendations for Other Services       Precautions / Restrictions Precautions Precautions: Sternal;Fall Precaution Booklet Issued: No Precaution Comments: heavy verbal and tactile cues for sternal precautions, limited by pt's cognition and attention. Rectal tube    Mobility  Bed Mobility Overal bed mobility: Needs Assistance Bed Mobility: Rolling;Supine to Sit;Sit to Sidelying Rolling: Max assist;+2 for physical assistance   Supine to sit: Max assist;+2 for physical assistance   Sit to sidelying: Mod assist;+2 for physical assistance General bed mobility comments: mod-maxA +2 for bed mobility to adhere to sternal  precautions. Pt unable to follow commands consistently  Transfers Overall transfer level: Needs assistance Equipment used: 2 person hand held assist Transfers: Sit to/from Stand Sit to Stand: Mod assist;+2 physical assistance;Max assist         General transfer comment: modA +2 to stand from EOB. Pt able to stand for ~15sec, unable to take a step therefore maxA +2 to position hips closer to [facility]  Ambulation/Gait             General Gait Details: unable   Stairs             Wheelchair Mobility    Modified Rankin (Stroke Patients Only)       Balance Overall balance assessment: Needs assistance Sitting-balance support: No upper extremity supported;Feet supported Sitting balance-Leahy Scale: Poor Sitting balance - Comments: pt unable to maintain balance in sitting for >5seconds. Occasionally demo ability maintain sitting balance without assist for ~5sec, then begins to fall posteriorly Postural control: Posterior lean Standing balance support: Bilateral upper extremity supported Standing balance-Leahy Scale: Zero Standing balance comment: Pt unable to stand without +2 assist w/bil knee blocks                            Cognition Arousal/Alertness: Awake/alert Behavior During Therapy: Flat affect Overall Cognitive Status: Impaired/Different from baseline Area of Impairment: Orientation;Attention;Memory;Following commands;Safety/judgement;Awareness;Problem solving                 Orientation Level: Disoriented to;Place;Situation Current Attention Level: Sustained Memory: Decreased recall of precautions;Decreased short-term memory Following Commands: Follows one step commands inconsistently Safety/Judgement: Decreased awareness of safety;Decreased  awareness of deficits Awareness: Intellectual Problem Solving: Slow processing;Decreased initiation;Difficulty sequencing;Requires verbal cues;Requires tactile cues General Comments: pt not orientated  to place or situation, frequently stating "am I an experiment", "what did I do", "the president is mad at me because I'm good looking". Pt unable to follow sternal precautions without continuous verbal and tactile cues      Exercises      General Comments General comments (skin integrity, edema, etc.): Pt on 6L O2, VSS      Pertinent Vitals/Pain Pain Assessment: Faces Faces Pain Scale: Hurts a little bit Pain Location: posterior neck Pain Descriptors / Indicators: Discomfort;Aching Pain Intervention(s): Limited activity within patient's tolerance;Monitored during session;Repositioned    Home Living                      Prior Function            PT Goals (current goals can now be found in the care plan section) Acute Rehab PT Goals PT Goal Formulation: With patient Time For Goal Achievement: 11/12/19 Potential to Achieve Goals: Fair Progress towards PT goals: Progressing toward goals    Frequency    Min 3X/week      PT Plan Current plan remains appropriate    Co-evaluation              AM-PAC PT "6 Clicks" Mobility   Outcome Measure  Help needed turning from your back to your side while in a flat bed without using bedrails?: Total Help needed moving from lying on your back to sitting on the side of a flat bed without using bedrails?: Total Help needed moving to and from a bed to a chair (including a wheelchair)?: Total Help needed standing up from a chair using your arms (e.g., wheelchair or bedside chair)?: A Lot Help needed to walk in hospital room?: Total Help needed climbing 3-5 steps with a railing? : Total 6 Click Score: 7    End of Session Equipment Utilized During Treatment: Oxygen;Gait belt Activity Tolerance: Patient tolerated treatment well Patient left: in bed;with call bell/phone within reach;with bed alarm set;with nursing/sitter in room Nurse Communication: Mobility status;Precautions PT Visit Diagnosis: Unsteadiness on feet  (R26.81);Other abnormalities of gait and mobility (R26.89);Muscle weakness (generalized) (M62.81);Difficulty in walking, not elsewhere classified (R26.2)     Time: 8916-9450 PT Time Calculation (min) (ACUTE ONLY): 23 min  Charges:  $Therapeutic Activity: 23-37 mins                    Kayode Petion, SPT Acute Rehab  [phone]  Jolena Kittle 11/01/2019, 1:17 PM

## 2019-11-01 NOTE — Progress Notes (Signed)
PT Progress Note for Charges    11/01/19 1300  PT Visit Information  Last PT Received On 11/01/19  PT General Charges  $$ ACUTE PT VISIT 1 Visit  PT Treatments  $Therapeutic Activity 23-37 mins  Anastasio Champion, DPT  Acute Rehabilitation Services Pager [phone] Office [phone]

## 2019-11-01 NOTE — Progress Notes (Addendum)
      [address]       [place] [address]             [phone]      6 Days Post-Op Procedure(s) (LRB): CORONARY ARTERY BYPASS GRAFTING (CABG) x 5, Using Bilateral mammary arteries, and right leg greater saphenous vein harvested endoscopically (N/A) RADIAL ARTERY HARVEST (Left) TRANSESOPHAGEAL ECHOCARDIOGRAM (TEE) (N/A) INDOCYANINE GREEN FLUORESCENCE IMAGING (ICG) (N/A)   Subjective: Sleeping, woke up to say he was okay.  Objective: Vital signs in last 24 hours: Temp:  [97.6 F (36.4 C)-100.2 F (37.9 C)] 98.1 F (36.7 C) (04/21 0351) Pulse Rate:  [68-90] 81 (04/21 0400) Cardiac Rhythm: Atrial fibrillation (04/21 0400) Resp:  [12-21] 14 (04/21 0400) BP: (108-152)/(57-77) 138/69 (04/21 0400) SpO2:  [92 %-100 %] 96 % (04/21 0400) Weight:  [89.6 kg] 89.6 kg (04/21 0511)  Intake/Output from previous day: 04/20 0701 - 04/21 0700 In: 1819 [P.O.:960; I.V.:597.3; IV Piggyback:261.7] Out: 4325 [Urine:4325]  General appearance: alert, cooperative and no distress Heart: regular rate and rhythm Lungs: clear to auscultation bilaterally Abdomen: soft, non-tender; bowel sounds normal; no masses,  no organomegaly Extremities: edema trace Wound: clean and dry  Lab Results: Recent Labs    10/30/19 0613  WBC 13.6*  HGB 10.4*  HCT 31.4*  PLT 138*   BMET:  Recent Labs    10/31/19 0337 11/01/19 0335  NA 134* 130*  K 2.8* 2.7*  CL 87* 83*  CO2 32 34*  GLUCOSE 171* 180*  BUN 46* 42*  CREATININE 2.22* 2.11*  CALCIUM 8.2* 8.1*    PT/INR: No results for input(s): LABPROT, INR in the last 72 hours. ABG    Component Value Date/Time   PHART 7.399 10/28/2019 1347   HCO3 23.6 10/28/2019 1347   TCO2 25 10/28/2019 1347   ACIDBASEDEF 2.0 10/28/2019 1347   O2SAT 96.0 10/28/2019 1347   CBG (last 3)  Recent Labs    10/31/19 1946 10/31/19 2353 11/01/19 0335  GLUCAP 199* 129* 117*    Assessment/Plan: S/P Procedure(s) (LRB): CORONARY ARTERY BYPASS  GRAFTING (CABG) x 5, Using Bilateral mammary arteries, and right leg greater saphenous vein harvested endoscopically (N/A) RADIAL ARTERY HARVEST (Left) TRANSESOPHAGEAL ECHOCARDIOGRAM (TEE) (N/A) INDOCYANINE GREEN FLUORESCENCE IMAGING (ICG) (N/A)  1. CV- PAF, currently in NSR--- on Lopressor, Isordil, Amiodarone drip.Marland Kitchen will discuss transition to oral amiodarone with Dr. Orvan Seen.Marland Kitchen also on Eliquis 2. Pulm- wean oxygen as tolerated, continue IS 3. Renal- creatinine stable at 2.11, on Lasix gtt at 10 mg/hr will discuss further management with Dr. Orvan Seen, weight is below admission 4. Hypokalemia- IV replacement ordered 5. DM- sugars mostly controlled continue current insulin regimen 6. Deconditioning- for SNF at discharge, CSW is arranging 7. Dispo- patient stable, will discuss amiodarone drip and lasix drip management with Dr. Orvan Seen, increase BB for additional HR and BP control, continue current care   LOS: 12 days    Ellwood Handler, PA-C  11/01/2019 Agree with assessment and plan. Hold eliquis this am; cut or pull pacing wires later today. Orders for po amiodarone and furosemide entered. Shadell Brenn Z. Orvan Seen, [place]

## 2019-11-01 NOTE — Plan of Care (Signed)
  Problem: Clinical Measurements: Goal: Will remain free from infection Outcome: Progressing   Problem: Clinical Measurements: Goal: Cardiovascular complication will be avoided Outcome: Progressing   Problem: Nutrition: Goal: Adequate nutrition will be maintained Outcome: Progressing   Problem: Elimination: Goal: Will not experience complications related to urinary retention Outcome: Progressing   Problem: Elimination: Goal: Will not experience complications related to bowel motility Outcome: Progressing   Problem: Pain Managment: Goal: General experience of comfort will improve Outcome: Progressing   Problem: Safety: Goal: Ability to remain free from injury will improve Outcome: Progressing   Problem: Skin Integrity: Goal: Risk for impaired skin integrity will decrease Outcome: Progressing   Problem: Clinical Measurements: Goal: Postoperative complications will be avoided or minimized Outcome: Progressing   Problem: Skin Integrity: Goal: Wound healing without signs and symptoms of infection Outcome: Progressing

## 2019-11-02 LAB — BASIC METABOLIC PANEL
Anion gap: 15 (ref 5–15)
BUN: 42 mg/dL — ABNORMAL HIGH (ref 8–23)
CO2: 33 mmol/L — ABNORMAL HIGH (ref 22–32)
Calcium: 8 mg/dL — ABNORMAL LOW (ref 8.9–10.3)
Chloride: 80 mmol/L — ABNORMAL LOW (ref 98–111)
Creatinine, Ser: 1.99 mg/dL — ABNORMAL HIGH (ref 0.61–1.24)
GFR calc Af Amer: 41 mL/min — ABNORMAL LOW (ref >60)
GFR calc non Af Amer: 35 mL/min — ABNORMAL LOW (ref >60)
Glucose, Bld: 101 mg/dL — ABNORMAL HIGH (ref 70–99)
Potassium: 2.8 mmol/L — ABNORMAL LOW (ref 3.5–5.1)
Sodium: 128 mmol/L — ABNORMAL LOW (ref 135–145)

## 2019-11-02 LAB — RESPIRATORY PANEL BY RT PCR (FLU A&B, COVID)
Influenza A by PCR: NEGATIVE
Influenza B by PCR: NEGATIVE
SARS Coronavirus 2 by RT PCR: NEGATIVE

## 2019-11-02 LAB — GLUCOSE, CAPILLARY
Glucose-Capillary: 95 mg/dL (ref 70–99)
Glucose-Capillary: 99 mg/dL (ref 70–99)
Glucose-Capillary: 150 mg/dL — ABNORMAL HIGH (ref 70–99)
Glucose-Capillary: 107 mg/dL — ABNORMAL HIGH (ref 70–99)

## 2019-11-02 MED ORDER — POTASSIUM CHLORIDE 10 MEQ/50ML IV SOLN
10.0000 meq | INTRAVENOUS | Status: AC
  Administered 2019-11-02 (×6): 10 meq via INTRAVENOUS
  Filled 2019-11-02 (×6): qty 50

## 2019-11-02 MED ORDER — DM-GUAIFENESIN ER 30-600 MG PO TB12: 1.0000 | ORAL_TABLET | Freq: Two times a day (BID) | ORAL | Status: DC | PRN

## 2019-11-02 MED FILL — Heparin Sodium (Porcine) Inj 1000 Unit/ML: INTRAMUSCULAR | Qty: 30 | Status: AC

## 2019-11-02 MED FILL — Sodium Chloride IV Soln 0.9%: INTRAVENOUS | Qty: 2000 | Status: AC

## 2019-11-02 MED FILL — Electrolyte-R (PH 7.4) Solution: INTRAVENOUS | Qty: 4000 | Status: AC

## 2019-11-02 MED FILL — Sodium Bicarbonate IV Soln 8.4%: INTRAVENOUS | Qty: 50 | Status: AC

## 2019-11-02 MED FILL — Heparin Sodium (Porcine) Inj 1000 Unit/ML: INTRAMUSCULAR | Qty: 10 | Status: AC

## 2019-11-02 MED FILL — Lidocaine HCl Local Soln Prefilled Syringe 100 MG/5ML (2%): INTRAMUSCULAR | Qty: 5 | Status: AC

## 2019-11-02 MED FILL — Mannitol IV Soln 20%: INTRAVENOUS | Qty: 500 | Status: AC

## 2019-11-02 NOTE — Progress Notes (Signed)
      [address]       [place] [address]             [phone]      7 Days Post-Op Procedure(s) (LRB): CORONARY ARTERY BYPASS GRAFTING (CABG) x 5, Using Bilateral mammary arteries, and right leg greater saphenous vein harvested endoscopically (N/A) RADIAL ARTERY HARVEST (Left) TRANSESOPHAGEAL ECHOCARDIOGRAM (TEE) (N/A) INDOCYANINE GREEN FLUORESCENCE IMAGING (ICG) (N/A)   Subjective:  Patient states he has a lot of shoulder pain this morning.  He is requesting pain medication.  Objective: Vital signs in last 24 hours: Temp:  [97.5 F (36.4 C)-98.5 F (36.9 C)] 97.5 F (36.4 C) (04/22 0757) Pulse Rate:  [58-97] 87 (04/22 0338) Cardiac Rhythm: Atrial fibrillation (04/22 0700) Resp:  [14-20] 20 (04/22 0338) BP: (117-139)/(61-82) 117/65 (04/22 0338) SpO2:  [98 %-100 %] 98 % (04/22 0338) Weight:  [88.5 kg] 88.5 kg (04/22 0508)  Intake/Output from previous day: 04/21 0701 - 04/22 0700 In: 2246.6 [P.O.:2140; I.V.:106.6] Out: 2550 [Urine:2550]  General appearance: alert, cooperative and no distress Heart: regular rate and rhythm Lungs: coarse breath sounds, clears with cough Abdomen: soft, non-tender; bowel sounds normal; no masses,  no organomegaly Extremities: edema trace Wound: clean and dry  Lab Results: No results for input(s): WBC, HGB, HCT, PLT in the last 72 hours. BMET:  Recent Labs    10/31/19 0337 11/01/19 0335  NA 134* 130*  K 2.8* 2.7*  CL 87* 83*  CO2 32 34*  GLUCOSE 171* 180*  BUN 46* 42*  CREATININE 2.22* 2.11*  CALCIUM 8.2* 8.1*    PT/INR: No results for input(s): LABPROT, INR in the last 72 hours. ABG    Component Value Date/Time   PHART 7.399 10/28/2019 1347   HCO3 23.6 10/28/2019 1347   TCO2 25 10/28/2019 1347   ACIDBASEDEF 2.0 10/28/2019 1347   O2SAT 96.0 10/28/2019 1347   CBG (last 3)  Recent Labs    11/01/19 2024 11/01/19 2317 11/02/19 0633  GLUCAP 167* 120* 95    Assessment/Plan: S/P Procedure(s)  (LRB): CORONARY ARTERY BYPASS GRAFTING (CABG) x 5, Using Bilateral mammary arteries, and right leg greater saphenous vein harvested endoscopically (N/A) RADIAL ARTERY HARVEST (Left) TRANSESOPHAGEAL ECHOCARDIOGRAM (TEE) (N/A) INDOCYANINE GREEN FLUORESCENCE IMAGING (ICG) (N/A)  1. CV- PAF, maintaining NSR- continue Lopressor, Isordi, Amiodarone has been transitioned to oral tablet, continue Eliquis 2. Pulm- weaning oxygen as able, + congestion on exam, will add Mucinex to help clear cough, continue nebs, IS 3. Renal- creatinine had been stable, on IV Lasix 80 mg BID, K was low at 2.7, replacement ordered, however no BMET drawn.. will get level this morning 4. DM- sugars controlled 5. Deconditioning- will need SNF likely Monday 6. Dispo- patient stable, add Mucinex for cough, get BMET, continue PT/OT.Marland Kitchen if patient remains stable and continues to improve, can hopefully d/c to SNF on Monday   LOS: 13 days    Ellwood Handler, PA-C  11/02/2019

## 2019-11-02 NOTE — Progress Notes (Signed)
Late note from 4/21Spoke to patients  Nurse Urban Gibson to speak to patient. She states he is confused currently. Called patients brother, [name]. Informed him of offer from  [facility] for after hospital placement. He agreed to [employer] .

## 2019-11-02 NOTE — Care Management (Signed)
Per Clare Gandy B. [facility] [facility]: Co-pay amount for Eliquis 2.88m and or 531mbid for a 30,60,or 90 day supply is $9.20. @retail  pharmacy or [facility] mail order pharmacy.  Co-pay amount for Vascepa 2gm po bid for 30,60,or 90 day supply $9.20  No PA required No deductible Eliquis Tier 3 Vascepa Tier 4  Pharmacy : Any retail pharmacy [facility]. [facility]

## 2019-11-02 NOTE — Plan of Care (Signed)

## 2019-11-02 NOTE — TOC Progression Note (Signed)
Transition of Care [facility]) - Progression Note    Patient Details  Name: Larry Terrell MRN: [id number] Date of Birth: [birth year]  Transition of Care [facility]) CM/SW [name], Larry Terrell Phone Number: 11/02/2019, 11:23 AM  Clinical Narrative:     CSW spoke with previous floor CSW Larry Terrell who reports patient's brother chose [facility] on 4/21 pending documentation of this converstaion that will be input today per [place].   CSW reached out to Larry Terrell with [facility] they have started [employer] today. CSW has reached out to MD requesting updated Covid test be ordered.    Expected Discharge Plan: Skilled Nursing Facility Barriers to Discharge: Continued Medical Work up  Expected Discharge Plan and Services Expected Discharge Plan: [name]                                               Social Determinants of Health (SDOH) Interventions    Readmission Risk Interventions No flowsheet data found.

## 2019-11-03 LAB — GLUCOSE, CAPILLARY
Glucose-Capillary: 115 mg/dL — ABNORMAL HIGH (ref 70–99)
Glucose-Capillary: 105 mg/dL — ABNORMAL HIGH (ref 70–99)
Glucose-Capillary: 103 mg/dL — ABNORMAL HIGH (ref 70–99)
Glucose-Capillary: 146 mg/dL — ABNORMAL HIGH (ref 70–99)

## 2019-11-03 LAB — BASIC METABOLIC PANEL
Anion gap: 16 — ABNORMAL HIGH (ref 5–15)
BUN: 51 mg/dL — ABNORMAL HIGH (ref 8–23)
CO2: 33 mmol/L — ABNORMAL HIGH (ref 22–32)
Calcium: 8.6 mg/dL — ABNORMAL LOW (ref 8.9–10.3)
Chloride: 83 mmol/L — ABNORMAL LOW (ref 98–111)
Creatinine, Ser: 2.13 mg/dL — ABNORMAL HIGH (ref 0.61–1.24)
GFR calc Af Amer: 38 mL/min — ABNORMAL LOW (ref >60)
GFR calc non Af Amer: 32 mL/min — ABNORMAL LOW (ref >60)
Glucose, Bld: 117 mg/dL — ABNORMAL HIGH (ref 70–99)
Potassium: 3.6 mmol/L (ref 3.5–5.1)
Sodium: 132 mmol/L — ABNORMAL LOW (ref 135–145)

## 2019-11-03 MED ORDER — FUROSEMIDE 40 MG PO TABS
40.0000 mg | ORAL_TABLET | Freq: Two times a day (BID) | ORAL | Status: DC
  Administered 2019-11-03 – 2019-11-04 (×2): 40 mg via ORAL
  Filled 2019-11-03 (×2): qty 1
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
## 2019-11-03 NOTE — Progress Notes (Signed)
      [address]       [place] [address]             [phone]      8 Days Post-Op Procedure(s) (LRB): CORONARY ARTERY BYPASS GRAFTING (CABG) x 5, Using Bilateral mammary arteries, and right leg greater saphenous vein harvested endoscopically (N/A) RADIAL ARTERY HARVEST (Left) TRANSESOPHAGEAL ECHOCARDIOGRAM (TEE) (N/A) INDOCYANINE GREEN FLUORESCENCE IMAGING (ICG) (N/A)   Subjective:  Patient confused this morning.  States why am I here.  I re-orientated patient to place and time.  Objective: Vital signs in last 24 hours: Temp:  [97.3 F (36.3 C)-98.8 F (37.1 C)] 97.4 F (36.3 C) (04/23 0443) Pulse Rate:  [75-93] 80 (04/23 0443) Cardiac Rhythm: (P) Atrial fibrillation (04/23 0430) Resp:  [9-19] 16 (04/23 0443) BP: (97-141)/(53-82) 109/60 (04/23 0443) SpO2:  [95 %-99 %] 97 % (04/23 0443) Weight:  [85 kg] 85 kg (04/23 0443)  Intake/Output from previous day: 04/22 0701 - 04/23 0700 In: 350 [P.O.:300; IV Piggyback:50] Out: 2625 [Urine:2625]  General appearance: alert, cooperative and no distress Heart: regular rate and rhythm Lungs: clear to auscultation bilaterally Abdomen: soft, non-tender; bowel sounds normal; no masses,  no organomegaly Extremities: edema trace Wound: clean and dry  Lab Results: No results for input(s): WBC, HGB, HCT, PLT in the last 72 hours. BMET:  Recent Labs    11/02/19 1022 11/03/19 0446  NA 128* 132*  K 2.8* 3.6  CL 80* 83*  CO2 33* 33*  GLUCOSE 101* 117*  BUN 42* 51*  CREATININE 1.99* 2.13*  CALCIUM 8.0* 8.6*    PT/INR: No results for input(s): LABPROT, INR in the last 72 hours. ABG    Component Value Date/Time   PHART 7.399 10/28/2019 1347   HCO3 23.6 10/28/2019 1347   TCO2 25 10/28/2019 1347   ACIDBASEDEF 2.0 10/28/2019 1347   O2SAT 96.0 10/28/2019 1347   CBG (last 3)  Recent Labs    11/02/19 1613 11/02/19 2144 11/03/19 0612  GLUCAP 150* 107* 115*    Assessment/Plan: S/P Procedure(s)  (LRB): CORONARY ARTERY BYPASS GRAFTING (CABG) x 5, Using Bilateral mammary arteries, and right leg greater saphenous vein harvested endoscopically (N/A) RADIAL ARTERY HARVEST (Left) TRANSESOPHAGEAL ECHOCARDIOGRAM (TEE) (N/A) INDOCYANINE GREEN FLUORESCENCE IMAGING (ICG) (N/A)  1. CV- PAF, maintaining NSR- continue Lopressor, Isordil, Amiodarone, and Eliquis 2. Pulm- continue to wean oxygen as tolerated, currently on 3L 3. Renal- creatinine stable, on Lasix 80 mg BID... will speak with Dr. Orvan Seen when to transition to oral medications 4. Hypokalemia- resolved, K at 3.6, continue supplementation while on Lasix 5. DM-sugars are controlled 6. Deconditioning- for SNF, insurance authorization is in proceess 7. Dispo- patient stable, will speak with Dr. Orvan Seen in regards to Lasix regimen, continue to supplement K, continue PT/OT.Marland Kitchen patient is confused at times, this resolves throughout the day, for SNF on Monday   LOS: 14 days    Ellwood Handler, PA-C 11/03/2019

## 2019-11-03 NOTE — Progress Notes (Signed)
Pt states he cannot wear CPAP due to pressure too high, even at home.

## 2019-11-03 NOTE — Plan of Care (Signed)
  Problem: Clinical Measurements: Goal: Will remain free from infection Outcome: Progressing   Problem: Elimination: Goal: Will not experience complications related to bowel motility Outcome: Progressing   Problem: Skin Integrity: Goal: Wound healing without signs and symptoms of infection Outcome: Progressing Goal: Risk for impaired skin integrity will decrease Outcome: Progressing

## 2019-11-04 ENCOUNTER — Inpatient Hospital Stay (HOSPITAL_COMMUNITY): Payer: Medicare HMO

## 2019-11-04 LAB — GLUCOSE, CAPILLARY
Glucose-Capillary: 114 mg/dL — ABNORMAL HIGH (ref 70–99)
Glucose-Capillary: 121 mg/dL — ABNORMAL HIGH (ref 70–99)
Glucose-Capillary: 136 mg/dL — ABNORMAL HIGH (ref 70–99)
Glucose-Capillary: 139 mg/dL — ABNORMAL HIGH (ref 70–99)
Glucose-Capillary: 140 mg/dL — ABNORMAL HIGH (ref 70–99)
Glucose-Capillary: 157 mg/dL — ABNORMAL HIGH (ref 70–99)

## 2019-11-04 MED ORDER — POTASSIUM CHLORIDE CRYS ER 20 MEQ PO TBCR
20.0000 meq | EXTENDED_RELEASE_TABLET | Freq: Once | ORAL | Status: AC
  Administered 2019-11-04: 20 meq via ORAL
  Filled 2019-11-04: qty 1

## 2019-11-04 MED ORDER — FUROSEMIDE 40 MG PO TABS
40.0000 mg | ORAL_TABLET | Freq: Every day | ORAL | Status: DC
  Administered 2019-11-05 – 2019-11-06 (×2): 40 mg via ORAL
  Filled 2019-11-04 (×2): qty 1

## 2019-11-04 NOTE — Plan of Care (Signed)
  Problem: Clinical Measurements: Goal: Ability to maintain clinical measurements within normal limits will improve Outcome: Progressing   Problem: Clinical Measurements: Goal: Diagnostic test results will improve Outcome: Progressing   Problem: Clinical Measurements: Goal: Cardiovascular complication will be avoided Outcome: Progressing   Problem: Coping: Goal: Level of anxiety will decrease Outcome: Progressing   Problem: Nutrition: Goal: Adequate nutrition will be maintained Outcome: Progressing   Problem: Elimination: Goal: Will not experience complications related to bowel motility Outcome: Progressing

## 2019-11-04 NOTE — Progress Notes (Addendum)
      [address]       [place] [address]             [phone]        9 Days Post-Op Procedure(s) (LRB): CORONARY ARTERY BYPASS GRAFTING (CABG) x 5, Using Bilateral mammary arteries, and right leg greater saphenous vein harvested endoscopically (N/A) RADIAL ARTERY HARVEST (Left) TRANSESOPHAGEAL ECHOCARDIOGRAM (TEE) (N/A) INDOCYANINE GREEN FLUORESCENCE IMAGING (ICG) (N/A)  Subjective: Patient awake and alert his am. He has had intermittent confusion but re orients with assistance  Objective: Vital signs in last 24 hours: Temp:  [97.9 F (36.6 C)-98.5 F (36.9 C)] 98.3 F (36.8 C) (04/24 0758) Pulse Rate:  [78-90] 78 (04/24 0758) Cardiac Rhythm: Atrial fibrillation (04/24 0701) Resp:  [14-18] 16 (04/24 0758) BP: (93-138)/(57-73) 138/70 (04/24 0758) SpO2:  [92 %-96 %] 96 % (04/24 0758) Weight:  [84.1 kg] 84.1 kg (04/24 0458)  Pre op weight 90.7 kg Current Weight  11/04/19 84.1 kg      Intake/Output from previous day: 04/23 0701 - 04/24 0700 In: 300 [P.O.:300] Out: 1825 [Urine:1825]   Physical Exam:  Cardiovascular: IRRR IRRR Pulmonary: Slightly diminished basilar breath sounds on left Abdomen: Soft, non tender, bowel sounds present. Extremities: Mild bilateral lower extremity edema. Wounds: Clean and dry.  No erythema or signs of infection.  Lab Results: CBC:No results for input(s): WBC, HGB, HCT, PLT in the last 72 hours. BMET:  Recent Labs    11/02/19 1022 11/03/19 0446  NA 128* 132*  K 2.8* 3.6  CL 80* 83*  CO2 33* 33*  GLUCOSE 101* 117*  BUN 42* 51*  CREATININE 1.99* 2.13*  CALCIUM 8.0* 8.6*    PT/INR:  Lab Results  Component Value Date   INR 1.4 (H) 10/26/2019   INR 1.0 10/19/2019   INR 1.1 06/04/2019   ABG:  INR: Will add last result for INR, ABG once components are confirmed Will add last 4 CBG results once components are confirmed  Assessment/Plan:  1. CV - PAF. On Amiodarone 400 mg bid, Apixaban 5 mg bid,  Isordil 20 mg tid, Lopressor 37.5 mg bid. 2.  Pulmonary - On 6 liters of oxygen via [place]. Wean as able. Check CXR as requiring more oxygen. Of note, he did refuse CPAP last night but patient states he does not remember that.  Encourage incentive spirometer. 3. Volume Overload - On Lasix 40 mg bid. May be able to decrease soon 4.  Expected post op acute blood loss anemia - Last H and H 10.4 and 31.4 5. DM-CBGs 157/139/114. On Insulin. He was on Metformin pre op but this has not been restarted secondary to elevated creatinine. Pre op HGA1C 7.3 6. Creatinine yesterday 2.13. Will recheck in am. Creatinine upon admission was 1.36 7. Hopefully, to [facility] Monday   Donielle M ZimmermanPA-C 11/04/2019,9:44 AM    Chart reviewed, patient examined, agree with above. He is still in rate controlled AF on amiodarone. Apixaban for anticoagulation.  CXR shows mild bibasilar atelectasis but looks ok overall.  Weight is coming down with diuresis. He is below preop wt. Will decrease lasix to once daily.

## 2019-11-05 LAB — BASIC METABOLIC PANEL
Anion gap: 13 (ref 5–15)
BUN: 58 mg/dL — ABNORMAL HIGH (ref 8–23)
CO2: 31 mmol/L (ref 22–32)
Calcium: 8.2 mg/dL — ABNORMAL LOW (ref 8.9–10.3)
Chloride: 86 mmol/L — ABNORMAL LOW (ref 98–111)
Creatinine, Ser: 1.8 mg/dL — ABNORMAL HIGH (ref 0.61–1.24)
GFR calc Af Amer: 46 mL/min — ABNORMAL LOW (ref >60)
GFR calc non Af Amer: 40 mL/min — ABNORMAL LOW (ref >60)
Glucose, Bld: 116 mg/dL — ABNORMAL HIGH (ref 70–99)
Potassium: 3.2 mmol/L — ABNORMAL LOW (ref 3.5–5.1)
Sodium: 130 mmol/L — ABNORMAL LOW (ref 135–145)

## 2019-11-05 LAB — GLUCOSE, CAPILLARY
Glucose-Capillary: 111 mg/dL — ABNORMAL HIGH (ref 70–99)
Glucose-Capillary: 206 mg/dL — ABNORMAL HIGH (ref 70–99)
Glucose-Capillary: 209 mg/dL — ABNORMAL HIGH (ref 70–99)
Glucose-Capillary: 238 mg/dL — ABNORMAL HIGH (ref 70–99)

## 2019-11-05 LAB — CBC
HCT: 28.2 % — ABNORMAL LOW (ref 39.0–52.0)
Hemoglobin: 9.5 g/dL — ABNORMAL LOW (ref 13.0–17.0)
MCH: 30.9 pg (ref 26.0–34.0)
MCHC: 33.7 g/dL (ref 30.0–36.0)
MCV: 91.9 fL (ref 80.0–100.0)
Platelets: 274 10*3/uL (ref 150–400)
RBC: 3.07 MIL/uL — ABNORMAL LOW (ref 4.22–5.81)
RDW: 16.2 % — ABNORMAL HIGH (ref 11.5–15.5)
WBC: 11.2 10*3/uL — ABNORMAL HIGH (ref 4.0–10.5)
nRBC: 0 % (ref 0.0–0.2)

## 2019-11-05 MED ORDER — POTASSIUM CHLORIDE CRYS ER 20 MEQ PO TBCR
30.0000 meq | EXTENDED_RELEASE_TABLET | Freq: Three times a day (TID) | ORAL | Status: AC
  Administered 2019-11-05 (×3): 30 meq via ORAL
  Filled 2019-11-05 (×5): qty 1

## 2019-11-05 MED ORDER — POTASSIUM CHLORIDE CRYS ER 20 MEQ PO TBCR
20.0000 meq | EXTENDED_RELEASE_TABLET | Freq: Every day | ORAL | Status: DC
  Administered 2019-11-06: 20 meq via ORAL
  Filled 2019-11-05: qty 1

## 2019-11-05 NOTE — Plan of Care (Signed)
  Problem: Health Behavior/Discharge Planning: Goal: Ability to manage health-related needs will improve Outcome: Progressing   Problem: Clinical Measurements: Goal: Will remain free from infection Outcome: Progressing Goal: Diagnostic test results will improve Outcome: Progressing Goal: Cardiovascular complication will be avoided Outcome: Progressing   Problem: Nutrition: Goal: Adequate nutrition will be maintained Outcome: Progressing   Problem: Safety: Goal: Ability to remain free from injury will improve Outcome: Progressing

## 2019-11-05 NOTE — Plan of Care (Signed)

## 2019-11-05 NOTE — Progress Notes (Addendum)
Patient encouraged to practice feeding himself after set up--he has done well at breakfast and at lunch---I have asked for PT consult from Dr Tacy Dura for ambulation assessment---patient needs constant reminding to use call bell button when he needs to go to the bathroom along with return demonstration with which button to push on tv remote

## 2019-11-05 NOTE — Progress Notes (Addendum)
      [address]       [place] [address]             [phone]        10 Days Post-Op Procedure(s) (LRB): CORONARY ARTERY BYPASS GRAFTING (CABG) x 5, Using Bilateral mammary arteries, and right leg greater saphenous vein harvested endoscopically (N/A) RADIAL ARTERY HARVEST (Left) TRANSESOPHAGEAL ECHOCARDIOGRAM (TEE) (N/A) INDOCYANINE GREEN FLUORESCENCE IMAGING (ICG) (N/A)  Subjective: Patient sleeping and awakened. No specific complaint this am  Objective: Vital signs in last 24 hours: Temp:  [97.8 F (36.6 C)-98.6 F (37 C)] 98.1 F (36.7 C) (04/25 0735) Pulse Rate:  [73-87] 85 (04/25 0413) Cardiac Rhythm: Atrial fibrillation (04/25 0703) Resp:  [14-22] 14 (04/25 0735) BP: (105-138)/(59-80) 138/67 (04/25 0735) SpO2:  [86 %-98 %] 95 % (04/25 0735) Weight:  [83.7 kg] 83.7 kg (04/25 0413)  Pre op weight 90.7 kg Current Weight  11/05/19 83.7 kg      Intake/Output from previous day: 04/24 0701 - 04/25 0700 In: 365 [P.O.:365] Out: 650 [Urine:650]   Physical Exam:  Cardiovascular: IRRR IRRR Pulmonary: Slightly diminished bibasilar breath sounds  Abdomen: Soft, non tender, bowel sounds present. Extremities: Trace bilateral lower extremity edema. Wounds: Clean and dry.  No erythema or signs of infection.  Lab Results: CBC: Recent Labs    11/05/19 0500  WBC 11.2*  HGB 9.5*  HCT 28.2*  PLT 274   BMET:  Recent Labs    11/03/19 0446 11/05/19 0500  NA 132* 130*  K 3.6 3.2*  CL 83* 86*  CO2 33* 31  GLUCOSE 117* 116*  BUN 51* 58*  CREATININE 2.13* 1.80*  CALCIUM 8.6* 8.2*    PT/INR:  Lab Results  Component Value Date   INR 1.4 (H) 10/26/2019   INR 1.0 10/19/2019   INR 1.1 06/04/2019   ABG:  INR: Will add last result for INR, ABG once components are confirmed Will add last 4 CBG results once components are confirmed  Assessment/Plan:  1. CV - PAF. On Amiodarone 400 mg bid, Apixaban 5 mg bid, Isordil 20 mg tid, Lopressor  37.5 mg bid. 2.  Pulmonary - On 2 liters of oxygen via [place]. Weaning as able.  Encourage incentive spirometer. 3. Volume Overload - On Lasix 40 mg daily 4.  Expected post op acute blood loss anemia - H and H this am decreased to 9.5 and 28.2 5. DM-CBGs 121/136/111. On Insulin. He was on Metformin pre op but this has not been restarted secondary to elevated creatinine. Pre op HGA1C 7.3 6. Creatinine decreased to 1.8. Creatinine upon admission was 1.36. Re check in am 7. Supplement potassium 8. Remove PICC line and sutures in am 9. Hopefully, to [facility] Monday   Donielle M ZimmermanPA-C 11/05/2019,8:36 AM      Chart reviewed, patient examined, agree with above.

## 2019-11-06 LAB — BASIC METABOLIC PANEL
Anion gap: 11 (ref 5–15)
BUN: 57 mg/dL — ABNORMAL HIGH (ref 8–23)
CO2: 31 mmol/L (ref 22–32)
Calcium: 8.2 mg/dL — ABNORMAL LOW (ref 8.9–10.3)
Chloride: 90 mmol/L — ABNORMAL LOW (ref 98–111)
Creatinine, Ser: 1.83 mg/dL — ABNORMAL HIGH (ref 0.61–1.24)
GFR calc Af Amer: 45 mL/min — ABNORMAL LOW (ref >60)
GFR calc non Af Amer: 39 mL/min — ABNORMAL LOW (ref >60)
Glucose, Bld: 149 mg/dL — ABNORMAL HIGH (ref 70–99)
Potassium: 4.1 mmol/L (ref 3.5–5.1)
Sodium: 132 mmol/L — ABNORMAL LOW (ref 135–145)

## 2019-11-06 LAB — SARS CORONAVIRUS 2 (TAT 6-24 HRS): SARS Coronavirus 2: NEGATIVE

## 2019-11-06 LAB — GLUCOSE, CAPILLARY
Glucose-Capillary: 137 mg/dL — ABNORMAL HIGH (ref 70–99)
Glucose-Capillary: 134 mg/dL — ABNORMAL HIGH (ref 70–99)
Glucose-Capillary: 229 mg/dL — ABNORMAL HIGH (ref 70–99)

## 2019-11-06 MED ORDER — METOPROLOL TARTRATE 37.5 MG PO TABS: 37.5000 mg | ORAL_TABLET | Freq: Two times a day (BID) | ORAL | Status: AC

## 2019-11-06 MED ORDER — FUROSEMIDE 40 MG PO TABS: 40.0000 mg | ORAL_TABLET | Freq: Every day | ORAL | 0 refills | Status: DC

## 2019-11-06 MED ORDER — ISOSORBIDE DINITRATE 20 MG PO TABS: 20.0000 mg | ORAL_TABLET | Freq: Three times a day (TID) | ORAL | Status: AC

## 2019-11-06 MED ORDER — POTASSIUM CHLORIDE CRYS ER 20 MEQ PO TBCR: 20.0000 meq | EXTENDED_RELEASE_TABLET | Freq: Every day | ORAL | Status: DC

## 2019-11-06 MED ORDER — OXYCODONE HCL 5 MG PO TABS: 5.0000 mg | ORAL_TABLET | ORAL | 0 refills | Status: AC | PRN

## 2019-11-06 MED ORDER — AMIODARONE HCL 200 MG PO TABS: 200.0000 mg | ORAL_TABLET | Freq: Two times a day (BID) | ORAL | Status: DC

## 2019-11-06 MED ORDER — AMIODARONE HCL 200 MG PO TABS
200.0000 mg | ORAL_TABLET | Freq: Two times a day (BID) | ORAL | Status: DC
  Administered 2019-11-06: 200 mg via ORAL
  Filled 2019-11-06: qty 1

## 2019-11-06 MED ORDER — APIXABAN 5 MG PO TABS: 5.0000 mg | ORAL_TABLET | Freq: Two times a day (BID) | ORAL | Status: AC

## 2019-11-06 NOTE — TOC Transition Note (Addendum)
Transition of Care [facility]) - CM/SW Discharge Note   Patient Details  Name: Larry Terrell MRN: [id number] Date of Birth: 07-Oct-1960  Transition of Care [facility]) CM/SW Contact:  Alberteen Sam, LCSW Phone Number: 11/06/2019, 1:02 PM   Clinical Narrative:     Patient will DC to: [facility] [place] Anticipated DC date: 11/06/19 Family notified: [name] -lvm Transport GO:[facility]  Per MD patient ready for DC to [facility] . RN, patient, patient's family, and facility notified of DC. Discharge Summary sent to facility. RN given number for report [phone] Room 601 . DC packet on chart. Ambulance transport requested for patient.  CSW signing off.  [employer], [place]   Final next level of care: Skilled Nursing Facility Barriers to Discharge: No Barriers Identified   Patient Goals and CMS Choice Patient states their goals for this hospitalization and ongoing recovery are:: to go to skilled nursing facility CMS Medicare.gov Compare Post Acute Care list provided to:: Patient Represenative (must comment)(brother [name]) Choice offered to / list presented to : Patient  Discharge Placement PASRR number recieved: 10/30/19            Patient chooses bed at: [facility] [place] Patient to be transferred to facility by: [place] Name of family member notified: [name] Patient and family notified of of transfer: 11/06/19  Discharge Plan and Services                                     Social Determinants of Health (SDOH) Interventions     Readmission Risk Interventions No flowsheet data found.

## 2019-11-06 NOTE — Progress Notes (Signed)
Per report pt has been unable to ambulate or recall education. He has [facility] recovery book and left DM and HH diets today. For d/c. Yves Dill CES, ACSM 2:02 PM 11/06/2019

## 2019-11-06 NOTE — Progress Notes (Signed)
In preparing patient for D/C to SNF, I read Pricilla Riffle, Social Workers note from 4/22. I saw it read patients brother picked [facility] as placement of choice for his brother. I also, read MD was to order the Covid testing for his placement. I placed the Covid test order and swabbed the patients nose on 11/06/2019 at 0320, labeled the specimen and took it down to the lab. Will continue to prepare patient for his d/c to SNF as planned.

## 2019-11-06 NOTE — Progress Notes (Signed)
      [address]       [place] [address]             [phone]      11 Days Post-Op Procedure(s) (LRB): CORONARY ARTERY BYPASS GRAFTING (CABG) x 5, Using Bilateral mammary arteries, and right leg greater saphenous vein harvested endoscopically (N/A) RADIAL ARTERY HARVEST (Left) TRANSESOPHAGEAL ECHOCARDIOGRAM (TEE) (N/A) INDOCYANINE GREEN FLUORESCENCE IMAGING (ICG) (N/A)  Subjective:  Patient has no specific complaints this morning.  He has some mild confusion which is not abnormal for him.   Objective: Vital signs in last 24 hours: Temp:  [97.5 F (36.4 C)-98.4 F (36.9 C)] 97.5 F (36.4 C) (04/26 0500) Pulse Rate:  [29-111] 72 (04/26 0500) Cardiac Rhythm: Atrial fibrillation (04/26 0500) Resp:  [14-18] 16 (04/26 0500) BP: (108-136)/(61-74) 121/71 (04/26 0500) SpO2:  [69 %-99 %] 99 % (04/26 0500)  Intake/Output from previous day: 04/25 0701 - 04/26 0700 In: 840 [P.O.:840] Out: 600 [Urine:600]  General appearance: alert, cooperative and no distress Heart: regular rate and rhythm Lungs: clear to auscultation bilaterally Abdomen: soft, non-tender; bowel sounds normal; no masses,  no organomegaly Extremities: edema trace Wound: clean and dry  Lab Results: Recent Labs    11/05/19 0500  WBC 11.2*  HGB 9.5*  HCT 28.2*  PLT 274   BMET:  Recent Labs    11/05/19 0500 11/06/19 0331  NA 130* 132*  K 3.2* 4.1  CL 86* 90*  CO2 31 31  GLUCOSE 116* 149*  BUN 58* 57*  CREATININE 1.80* 1.83*  CALCIUM 8.2* 8.2*    PT/INR: No results for input(s): LABPROT, INR in the last 72 hours. ABG    Component Value Date/Time   PHART 7.399 10/28/2019 1347   HCO3 23.6 10/28/2019 1347   TCO2 25 10/28/2019 1347   ACIDBASEDEF 2.0 10/28/2019 1347   O2SAT 96.0 10/28/2019 1347   CBG (last 3)  Recent Labs    11/05/19 1628 11/05/19 2047 11/06/19 0627  GLUCAP 209* 238* 137*    Assessment/Plan: S/P Procedure(s) (LRB): CORONARY ARTERY BYPASS GRAFTING  (CABG) x 5, Using Bilateral mammary arteries, and right leg greater saphenous vein harvested endoscopically (N/A) RADIAL ARTERY HARVEST (Left) TRANSESOPHAGEAL ECHOCARDIOGRAM (TEE) (N/A) INDOCYANINE GREEN FLUORESCENCE IMAGING (ICG) (N/A)  1. CV- NSR, will decrease Amiodarone, continue Isordil, Lopressor 2. Pulm- wean oxygen as tolerated 3. Renal- creatinine remains stable, will continue Lasix 40 mg daily for another week 4. Anxiety/Depression- continue Effexor, Xanax 5. Deconditioning- continue PT/OT, needs SNF placement 6. Dispo- patient stable, will d/c to SNF today if bed is available   LOS: 17 days    Ellwood Handler, PA-C  11/06/2019

## 2019-11-06 NOTE — Progress Notes (Signed)
Report called to [facility] [place], [employer] took report.  Patient sister Treylin Burtch notified of impending move and address, phone number and room number

## 2019-11-06 NOTE — Discharge Instructions (Signed)
Discharge Instructions:  1. You may shower, please wash incisions daily with soap and water and keep dry.  If you wish to cover wounds with dressing you may do so but please keep clean and change daily.  No tub baths or swimming until incisions have completely healed.  If your incisions become red or develop any drainage please call our office at [phone]  2. No Driving until cleared by Dr. Orvan Seen office and you are no longer using narcotic pain medications  3. Monitor your weight daily.. Please use the same scale and weigh at same time... If you gain 5-10 lbs in 48 hours with associated lower extremity swelling, please contact our office at [phone]  4. Fever of 101.5 for at least 24 hours with no source, please contact our office at [phone]  Information on my medicine - ELIQUIS (apixaban)  This medication education was reviewed with me or my healthcare representative as part of my discharge preparation.   Why was Eliquis prescribed for you? Eliquis was prescribed for you to reduce the risk of a blood clot forming that can cause a stroke if you have a medical condition called atrial fibrillation (a type of irregular heartbeat).  What do You need to know about Eliquis ? Take your Eliquis TWICE DAILY - one tablet in the morning and one tablet in the evening with or without food. If you have difficulty swallowing the tablet whole please discuss with your pharmacist how to take the medication safely.  Take Eliquis exactly as prescribed by your doctor and DO NOT stop taking Eliquis without talking to the doctor who prescribed the medication.  Stopping may increase your risk of developing a stroke.  Refill your prescription before you run out.  After discharge, you should have regular check-up appointments with your healthcare provider that is prescribing your Eliquis.  In the future your dose may need to be changed if your kidney function or weight changes by a significant  amount or as you get older.  What do you do if you miss a dose? If you miss a dose, take it as soon as you remember on the same day and resume taking twice daily.  Do not take more than one dose of ELIQUIS at the same time to make up a missed dose.  Important Safety Information A possible side effect of Eliquis is bleeding. You should call your healthcare provider right away if you experience any of the following: ? Bleeding from an injury or your nose that does not stop. ? Unusual colored urine (red or dark brown) or unusual colored stools (red or black). ? Unusual bruising for unknown reasons. ? A serious fall or if you hit your head (even if there is no bleeding).  Some medicines may interact with Eliquis and might increase your risk of bleeding or clotting while on Eliquis. To help avoid this, consult your healthcare provider or pharmacist prior to using any new prescription or non-prescription medications, including herbals, vitamins, non-steroidal anti-inflammatory drugs (NSAIDs) and supplements.  This website has more information on Eliquis (apixaban): http://www.eliquis.com/eliquis/home   5. Activity- up as tolerated, please walk at least 3 times per day.  Avoid strenuous activity, no lifting, pushing, or pulling with your arms over 8-10 lbs for a minimum of 6 weeks  6. If any questions or concerns arise, please do not hesitate to contact our office at [phone]

## 2019-11-06 NOTE — Progress Notes (Signed)
Spoke with RN Pamala Hurry and she states the PICC is not ready to be DC'd yet. I requested that the nurse re enter the order when the patient is ready for discharge

## 2019-11-06 NOTE — Plan of Care (Signed)
  Problem: Education: Goal: Knowledge of General Education information will improve Description: Including pain rating scale, medication(s)/side effects and non-pharmacologic comfort measures Outcome: Progressing   Problem: Health Behavior/Discharge Planning: Goal: Ability to manage health-related needs will improve Outcome: Progressing   Problem: Clinical Measurements: Goal: Ability to maintain clinical measurements within normal limits will improve Outcome: Progressing Goal: Will remain free from infection Outcome: Progressing Goal: Diagnostic test results will improve Outcome: Progressing Goal: Respiratory complications will improve Outcome: Progressing Goal: Cardiovascular complication will be avoided Outcome: Progressing   Problem: Activity: Goal: Risk for activity intolerance will decrease Outcome: Progressing   Problem: Nutrition: Goal: Adequate nutrition will be maintained Outcome: Progressing   Problem: Coping: Goal: Level of anxiety will decrease Outcome: Progressing   Problem: Elimination: Goal: Will not experience complications related to bowel motility Outcome: Progressing Goal: Will not experience complications related to urinary retention Outcome: Progressing   Problem: Pain Managment: Goal: General experience of comfort will improve Outcome: Progressing   Problem: Safety: Goal: Ability to remain free from injury will improve Outcome: Progressing   Problem: Skin Integrity: Goal: Risk for impaired skin integrity will decrease Outcome: Progressing   Problem: Education: Goal: Will demonstrate proper wound care and an understanding of methods to prevent future damage Outcome: Progressing Goal: Knowledge of disease or condition will improve Outcome: Progressing Goal: Knowledge of the prescribed therapeutic regimen will improve Outcome: Progressing Goal: Individualized Educational Video(s) Outcome: Progressing   Problem: Education: Goal: Will  demonstrate proper wound care and an understanding of methods to prevent future damage Outcome: Progressing Goal: Knowledge of disease or condition will improve Outcome: Progressing Goal: Knowledge of the prescribed therapeutic regimen will improve Outcome: Progressing Goal: Individualized Educational Video(s) Outcome: Progressing   Problem: Activity: Goal: Risk for activity intolerance will decrease Outcome: Progressing   Problem: Cardiac: Goal: Will achieve and/or maintain hemodynamic stability Outcome: Progressing   Problem: Activity: Goal: Risk for activity intolerance will decrease Outcome: Progressing   Problem: Cardiac: Goal: Will achieve and/or maintain hemodynamic stability Outcome: Progressing   Problem: Clinical Measurements: Goal: Postoperative complications will be avoided or minimized Outcome: Progressing   Problem: Respiratory: Goal: Respiratory status will improve Outcome: Progressing   Problem: Skin Integrity: Goal: Wound healing without signs and symptoms of infection Outcome: Progressing Goal: Risk for impaired skin integrity will decrease Outcome: Progressing   Problem: Urinary Elimination: Goal: Ability to achieve and maintain adequate renal perfusion and functioning will improve Outcome: Progressing

## 2019-11-06 NOTE — TOC Progression Note (Signed)
Transition of Care [facility]) - Progression Note    Patient Details  Name: Larry Terrell MRN: [id number] Date of Birth: [birth year]  Transition of Care [facility]) CM/SW [place], [place] Phone Number: 11/06/2019, 12:54 PM  Clinical Narrative:     CSW received notice from [facility] that insurance Josem Kaufmann has been received.   CSW informed RN Lilia Pro, will need dc summary and orders for patient to dc to [facility]. Pending dc summary at this time .  Expected Discharge Plan: [place] Barriers to Discharge: Continued Medical Work up  Expected Discharge Plan and Services Expected Discharge Plan: [place]         Expected Discharge Date: 11/06/19                                     Social Determinants of Health (SDOH) Interventions    Readmission Risk Interventions No flowsheet data found.

## 2019-11-06 NOTE — Progress Notes (Signed)
Pt D/C with [facility]AR to [facility]. BP 112/68 HR 74 R 19 SpO2 97 on 3L O2. Belonging sent with pt. [facility] aware pt is in route. Family notified.

## 2019-11-07 LAB — GLUCOSE, CAPILLARY: Glucose-Capillary: 23 mg/dL — CL (ref 70–99)

## 2019-11-10 ENCOUNTER — Other Ambulatory Visit: Payer: Self-pay | Admitting: Cardiothoracic Surgery

## 2019-11-10 DIAGNOSIS — Z951 Presence of aortocoronary bypass graft: Secondary | ICD-10-CM

## 2019-11-13 ENCOUNTER — Encounter: Payer: Self-pay | Admitting: Cardiothoracic Surgery

## 2019-11-13 ENCOUNTER — Ambulatory Visit (INDEPENDENT_AMBULATORY_CARE_PROVIDER_SITE_OTHER): Payer: Self-pay | Admitting: Cardiothoracic Surgery

## 2019-11-13 ENCOUNTER — Other Ambulatory Visit: Payer: Self-pay

## 2019-11-13 ENCOUNTER — Ambulatory Visit
Admission: RE | Admit: 2019-11-13 | Discharge: 2019-11-13 | Disposition: A | Discharge status: Home / Self Care | Payer: Medicaid Other | Source: Ambulatory Visit | Attending: Cardiothoracic Surgery | Admitting: Cardiothoracic Surgery

## 2019-11-13 VITALS — BP 102/65 | HR 84 | Temp 97.6°F | Resp 20 | Ht 67.0 in | Wt 184.0 lb

## 2019-11-13 DIAGNOSIS — Z951 Presence of aortocoronary bypass graft: Secondary | ICD-10-CM

## 2019-11-13 DIAGNOSIS — I2510 Atherosclerotic heart disease of native coronary artery without angina pectoris: Secondary | ICD-10-CM

## 2019-11-14 ENCOUNTER — Encounter: Payer: Self-pay | Admitting: Family

## 2019-11-14 ENCOUNTER — Ambulatory Visit (INDEPENDENT_AMBULATORY_CARE_PROVIDER_SITE_OTHER): Payer: Medicare HMO | Admitting: Family

## 2019-11-14 VITALS — BP 128/90 | HR 75 | Ht 69.0 in | Wt 195.0 lb

## 2019-11-14 DIAGNOSIS — Z79899 Other long term (current) drug therapy: Secondary | ICD-10-CM | POA: Diagnosis not present

## 2019-11-14 DIAGNOSIS — I10 Essential (primary) hypertension: Secondary | ICD-10-CM | POA: Diagnosis not present

## 2019-11-14 DIAGNOSIS — I5032 Chronic diastolic (congestive) heart failure: Secondary | ICD-10-CM

## 2019-11-14 DIAGNOSIS — E785 Hyperlipidemia, unspecified: Secondary | ICD-10-CM

## 2019-11-14 DIAGNOSIS — Z951 Presence of aortocoronary bypass graft: Secondary | ICD-10-CM

## 2019-11-14 DIAGNOSIS — I739 Peripheral vascular disease, unspecified: Secondary | ICD-10-CM

## 2019-11-14 DIAGNOSIS — I2510 Atherosclerotic heart disease of native coronary artery without angina pectoris: Secondary | ICD-10-CM

## 2019-11-14 MED ORDER — AMIODARONE HCL 200 MG PO TABS: 200.0000 mg | ORAL_TABLET | Freq: Every day | ORAL | Status: AC

## 2019-11-14 NOTE — Progress Notes (Signed)
Office Visit    Patient Name: Larry Terrell Date of Encounter: 11/14/2019  Primary Care Provider:  Med[facility], [place] [facility] Cardiologist:  Ida Rogue, MD Electrophysiologist:  None   Chief Complaint    Larry Terrell is a 59 y.o. male with a hx of CAD, HFpEF, TIA, PAD s/p aortobifemoral bypass 2013 with known stenosis of bilateral SFA, DM2, HTN, HLD, prior tobacco use presents today for follow up after CABG.    Past Medical History    Past Medical History:  Diagnosis Date  . CHF (congestive heart failure) (HCC)    diastolic (echo at [facility] 3790) EF 55-60%  . Cirrhosis ([place])    NASH per records  . COPD (chronic obstructive pulmonary disease) ([place])   . Depression   . Diabetes mellitus with neuropathy ([place])   . Heart attack ([place])   . Neuropathy   . OSA (obstructive sleep apnea)   . Peripheral vascular disease due to secondary diabetes mellitus ([place])   . Splenomegaly   . Temporal giant cell arteritis ([place])   . TIA (transient ischemic attack) April 2016   Past Surgical History:  Procedure Laterality Date  . AORTA - FEMORAL ARTERY BYPASS GRAFT Bilateral   . CARDIAC CATHETERIZATION    . cardiac stents  July 2015   . CORONARY ARTERY BYPASS GRAFT N/A 10/26/2019   Procedure: CORONARY ARTERY BYPASS GRAFTING (CABG) x 5, Using Bilateral mammary arteries, and right leg greater saphenous vein harvested endoscopically;  Surgeon: Wonda Olds, MD;  Location: [place];  Service: Open Heart Surgery;  Laterality: N/A;  . CORONARY STENT INTERVENTION N/A 08/09/2017   Procedure: CORONARY STENT INTERVENTION;  Surgeon: Wellington Hampshire, MD;  Location: [place] CV LAB;  Service: Cardiovascular;  Laterality: N/A;  . LAPAROSCOPIC APPENDECTOMY N/A 07/10/2015   Procedure: APPENDECTOMY LAPAROSCOPIC;  Surgeon: Rolm Bookbinder, MD;  Location: [place];  Service: General;  Laterality: N/A;  . LEFT HEART CATH AND CORONARY ANGIOGRAPHY N/A 08/09/2017   Procedure: LEFT HEART CATH AND  CORONARY ANGIOGRAPHY;  Surgeon: Wellington Hampshire, MD;  Location: [place] CV LAB;  Service: Cardiovascular;  Laterality: N/A;  . LEFT HEART CATH AND CORONARY ANGIOGRAPHY N/A 10/19/2019   Procedure: LEFT HEART CATH AND CORONARY ANGIOGRAPHY;  Surgeon: Minna Merritts, MD;  Location: [place] CV LAB;  Service: Cardiovascular;  Laterality: N/A;  . RADIAL ARTERY HARVEST Left 10/26/2019   Procedure: RADIAL ARTERY HARVEST;  Surgeon: Wonda Olds, MD;  Location: [place];  Service: Open Heart Surgery;  Laterality: Left;  . TEE WITHOUT CARDIOVERSION N/A 10/26/2019   Procedure: TRANSESOPHAGEAL ECHOCARDIOGRAM (TEE);  Surgeon: Wonda Olds, MD;  Location: [place];  Service: Open Heart Surgery;  Laterality: N/A;    Allergies  Allergies  Allergen Reactions  . Gabapentin   . Ace Inhibitors Cough    Other reaction(s): Other (See Comments) cough    History of Present Illness    Larry Terrell is a 59 y.o. male with a hx of CAD, HFpEF, TIA, PAD s/p aortobifemoral bypass 2013 with known stenosis of bilateral SFA, DM2, HTN, HLD, prior tobacco use. He was last seen while hospitalized.  Hi CAD is s/p remote MI and stenting to RCA and LCx with presenting symptoms at the tisme of left shoulder pain. Diagnostic cath for chest pain 07/30/17 with significant two-vessel CAD with patent RCA stents with moderate ISR and severe subocclusive ISR in LCx.  Underwent successful balloon angioplasty and DES to the LCx to cover  from the distal to the proximal area with 1 long stent.  Was lost to cardiology follow-up after this.  Presented to [facility] on 10/18/2019 with left shoulder discomfort that was similar to his anginal equivalent.  Subsequent LHC 10/19/19 with severe three-vessel CAD with severe proximal LAD stenosis, in-stent restenosis of the mid RCA as well as distal RCA disease, occluded proximal to mid LCx with ISR and severe ostial ramus disease. Echo 10/20/19 LVEF 55-60%, mild LVH, gr2DD, RV normal size and  function, LA moderately dilated.  He was transferred to [facility] for consideration of CABG. On 10/26/19 underwent CABGx5 (LIMA-LAD, RIMA-PDA, Sequwntial SVG to Distal OM, OM1, diagonal) additionally underwent endoscopic harvest on greater saphenous vein and open harvest left radial artery. Left radial artery unable to be used as it was a poor conduit. IV then PO amiodarone for atrial fib and started on Eliquis due to recurrent episodes. He was discharged on 11/06/19.  Present today with his brother from skilled nursing facility.  Reports feeling well and enjoys working with therapies at the skilled.  Does miss his 2 dogs and is hopeful to be home soon.  Reports some lightheadedness today.  Did not eat breakfast.  Blood pressure normal.  Encouraged he will continue to feel stronger.  He was provided water in the clinic.  Reports no shortness of breath nor dyspnea on exertion. Reports no chest pain, pressure, or tightness. No edema, orthopnea, PND.   He is unaware of his atrial fibrillation reports no palpitations, irregular heartbeats.  EKGs/Labs/Other Studies Reviewed:   The following studies were reviewed today:  Echo 10/20/19  1. Left ventricular ejection fraction, by estimation, is 55%. The left  ventricle has low normal function. Grossly, no significant wall motion  abnormality. There is mild left ventricular hypertrophy of the septal  segment. Grade II diastolic dysfunction.   2. Right ventricular systolic function is normal. The right ventricular  size is normal. Tricuspid regurgitation signal is inadequate for assessing  PA pressure.   3. Left atrial size was moderately dilated.   Cardiac cath 10/2019 [facility] 10/19/2019: Coronary dominance: Right  Left mainstem:   Large vessel that bifurcates into the LAD and left circumflex, no significant disease noted, unable to exclude mild distal left main disease, appears hazy  Left anterior descending (LAD):   Large vessel that extends to the  apical region, diagonal branch 2 of moderate size, severe proximal LAD disease at the takeoff of the diagonal estimated 95%, mild distal LAD disease  Left circumflex (LCx):  Large vessel with OM branch 2, proximal to mid left circumflex is occluded with in-stent restenosis OM seen distally, fills via collaterals High ramus with severe ostial disease estimated 90%  Right coronary artery (RCA):  Right dominant vessel with PL and PDA, severe in-stent restenosis of mid vessel stent, severe ostial PDA disease  Left ventriculography: Left ventricular systolic function is low normal, LVEF is estimated at 50%, there is no significant mitral regurgitation , no significant aortic valve stenosis Mild inferior wall hypokinesis  Final Conclusions:   Severe three-vessel coronary disease including Severe proximal LAD Severe mid RCA, in-stent restenosis , distal RCA Occluded proximal to mid left circumflex in-stent occlusion Severe ostial ramus disease Mild inferior wall hypokinesis  Recommendations:  Case discussed with interventional cardiology Given he has not been responding well to stenting as noted by occluded left circumflex stent, in-stent restenosis on the right in the setting of diabetes, would likely be a good candidate for CABG Given the severity of  his symptoms and disease, will transfer to [facility] for further discussion and evaluation   EKG:  EKG is  ordered today.  The ekg ordered today demonstrates rate controlled atrial fib 75 bpm  Recent Labs: 10/18/2019: B Natriuretic Peptide 177.0 10/27/2019: Magnesium 2.5 11/01/2019: ALT 19 11/05/2019: Hemoglobin 9.5; Platelets 274 11/06/2019: BUN 57; Creatinine, Ser 1.83; Potassium 4.1; Sodium 132  Recent Lipid Panel    Component Value Date/Time   CHOL 160 10/19/2019 0418   CHOL 172 11/07/2014 0418   TRIG 482 (H) 10/19/2019 0418   TRIG 298 (H) 11/07/2014 0418   HDL 23 (L) 10/19/2019 0418   HDL 25 (L) 11/07/2014 0418   CHOLHDL 7.0 10/19/2019  0418   VLDL UNABLE TO CALCULATE IF TRIGLYCERIDE OVER 400 mg/dL 10/19/2019 0418   VLDL 60 (H) 11/07/2014 0418   LDLCALC UNABLE TO CALCULATE IF TRIGLYCERIDE OVER 400 mg/dL 10/19/2019 0418   LDLCALC 87 11/07/2014 0418   LDLDIRECT 72.0 10/19/2019 0418    Home Medications   Current Meds  Medication Sig  . acetaminophen (TYLENOL) 500 MG tablet Take 1,000 mg by mouth daily as needed.  Marland Kitchen amiodarone (PACERONE) 200 MG tablet Take 1 tablet (200 mg total) by mouth daily.  Marland Kitchen apixaban (ELIQUIS) 5 MG TABS tablet Take 1 tablet (5 mg total) by mouth 2 (two) times daily.  Marland Kitchen aspirin 81 MG chewable tablet Chew 1 tablet (81 mg total) by mouth daily.  Marland Kitchen atorvastatin (LIPITOR) 80 MG tablet Take 1 tablet (80 mg total) by mouth daily.  Marland Kitchen ezetimibe (ZETIA) 10 MG tablet Take 1 tablet (10 mg total) by mouth daily.  . famotidine (PEPCID) 20 MG tablet Take 1 tablet (20 mg total) by mouth 2 (two) times daily. (Patient taking differently: Take 20 mg by mouth daily as needed. )  . furosemide (LASIX) 40 MG tablet Take 1 tablet (40 mg total) by mouth daily.  . insulin aspart (NOVOLOG) 100 UNIT/ML injection Inject 0-15 Units into the skin 4 (four) times daily - after meals and at bedtime. (Patient taking differently: Inject 0-15 Units into the skin 4 (four) times daily - after meals and at bedtime. Sliding scale)  . insulin aspart (NOVOLOG) 100 UNIT/ML injection Inject 10 Units into the skin 3 (three) times daily with meals.  . insulin glargine (LANTUS) 100 UNIT/ML injection Inject 0.05 mLs (5 Units total) into the skin daily.  . isosorbide dinitrate (ISORDIL) 20 MG tablet Take 1 tablet (20 mg total) by mouth 3 (three) times daily. For 1 month, then discontinue  . levETIRAcetam (KEPPRA) 500 MG tablet Take 500 mg by mouth 3 (three) times daily.  . metFORMIN (GLUCOPHAGE) 500 MG tablet Take 500 mg by mouth 2 (two) times daily with a meal.  . Metoprolol Tartrate 37.5 MG TABS Take 37.5 mg by mouth 2 (two) times daily.  .  nitroGLYCERIN (NITROSTAT) 0.4 MG SL tablet Place 0.4 mg under the tongue every 5 (five) minutes as needed for chest pain.  Marland Kitchen oxyCODONE (OXY IR/ROXICODONE) 5 MG immediate release tablet Take 1-2 tablets (5-10 mg total) by mouth every 4 (four) hours as needed for severe pain.  . pantoprazole (PROTONIX) 40 MG tablet Take 1 tablet (40 mg total) by mouth daily.  . potassium chloride SA (KLOR-CON) 20 MEQ tablet Take 1 tablet (20 mEq total) by mouth daily. For 7 days  . pregabalin (LYRICA) 50 MG capsule Take 1 capsule (50 mg total) by mouth 3 (three) times daily.  Marland Kitchen venlafaxine (EFFEXOR) 75 MG tablet Take 75 mg by  mouth 3 (three) times daily.  . [DISCONTINUED] amiodarone (PACERONE) 200 MG tablet Take 1 tablet (200 mg total) by mouth 2 (two) times daily.    Review of Systems      Review of Systems  Constitution: Negative for chills, fever and malaise/fatigue.  Cardiovascular: Negative for chest pain, dyspnea on exertion, irregular heartbeat, leg swelling, near-syncope, orthopnea, palpitations and syncope.  Respiratory: Negative for cough, shortness of breath and wheezing.   Gastrointestinal: Negative for melena, nausea and vomiting.  Genitourinary: Negative for hematuria.  Neurological: Positive for Cress-headedness and weakness. Negative for dizziness.   All other systems reviewed and are otherwise negative except as noted above.  Physical Exam    VS:  BP 128/90 (BP Location: Left Arm, Patient Position: Sitting, Cuff Size: Normal)   Pulse 75   Ht 5' 9"  (1.753 m)   Wt 195 lb (88.5 kg)   SpO2 99% Comment: on 4L O2  BMI 28.80 kg/m  , BMI Body mass index is 28.8 kg/m. GEN: Well nourished, well developed, in no acute distress. HEENT: normal. Neck: Supple, no JVD, carotid bruits, or masses. Cardiac: irregularly irregular, no murmurs, rubs, or gallops. No clubbing, cyanosis, edema.  Radials/DP/PT 2+ and equal bilaterally.  Respiratory:  Respirations regular and unlabored, clear to auscultation  bilaterally. GI: Soft, nontender, nondistended, BS + x 4. MS: No deformity or atrophy. Skin: Warm and dry, no rash.  Left arm incision clean, dry, intact.  Midsternal incision with some serous drainage, edges well approximated, no signs or symptoms of infection. Neuro:  Strength and sensation are intact. Psych: Normal affect.  Accessory Clinical Findings    ECG personally reviewed by me today - rate controlled 75 bpm with no acute St/T wave changes - no acute changes.  Assessment & Plan    1. CAD s/p CABG -stable with no anginal symptoms.  EKG today no acute ST/T wave changes.  Continue GDMT including aspirin, beta-blocker, statin.  Left radial graft site incision clean, dry, intact healing appropriately.  He will continue Isordil for 1 month from discharge per surgeon.  Midsternal incision with approximated edges but some serous drainage.  Dressing applied in clinic today.  Request SNF to monitor closely.  2. HFpEF -euvolemic and well compensated at this time.  ACE/ARB was discontinued in the hospital due to AKI, repeat BMP today for monitoring. No indication for standing diuretic at this time.  3. HLD, LDL goal <70 -started on atorvastatin and Zetia while hospitalized.  Will require follow-up lipid and liver panel in 6 weeks.  4. PAF -EKG today shows atrial fibrillation rate controlled at 75 bpm.  He is unaware of his atrial fibrillation.  Reduce amiodarone to 200 mg daily.  Amiodarone monitoring: TSH for baseline today, ALT/AST normal while hospitalized. Continue Metoprolol.  Continue Eliquis 5 mg twice daily for CHA2DS2-VASc of at least 3 (PAD, CAD, HFpEF).  Follow-up in 3 weeks for consideration of cardioversion.  It is very important that he not miss a dose of Eliquis if we consider cardioversion in 3 weeks.  Monitor closely for signs and symptoms of bleeding. CBC today.  5. PAD -Reports no symptoms of claudication.  Continue aspirin, statin, Zetia.  6. HTN - Blood pressure controlled in  clinic today.  Will not add ACE/ARB which was discontinued in the hospital.  Continue to monitor blood pressure goal less than 130/80.  Disposition: He has a somewhat limited understanding of his medical conditions. Labs today including TSH for monitoring of amiodarone therapy, BMP for monitoring  of renal function, CBC for monitoring of anticoagulations.  Follow up in 3 week(s) with Dr.Gollan or APP for consideration of cardioversion.   Loel Dubonnet, NP 11/14/2019, 10:12 AM

## 2019-11-14 NOTE — Patient Instructions (Addendum)
Medication Instructions:  Your provider has recommended you make the following change in your medication:   REDUCE Amiodarone to 225m daily  *If you need a refill on your cardiac medications before your next appointment, please call your pharmacy*   Lab Work: The following labs were collected today: BMET, TSH, CBC  If you have labs (blood work) drawn today and your tests are completely normal, you will receive your results only by: .Marland KitchenMyChart Message (if you have MyChart) OR . A paper copy in the mail If you have any lab test that is abnormal or we need to change your treatment, we will call you to review the results.   Testing/Procedures: Your EKG today showed rate controlled atrial fibrillation 75 bpm  Follow-Up: [facility] and your health needs are our priority.  As part of our continuing mission to provide you with exceptional heart care, we have created designated Provider Care Teams.  These Care Teams include your primary Cardiologist (physician) and Advanced Practice Providers (APPs -  Physician Assistants and Nurse Practitioners) who all work together to provide you with the care you need, when you need it.  We recommend signing up for the patient portal called "MyChart".  Sign up information is provided on this After Visit Summary.  MyChart is used to connect with patients for Virtual Visits (Telemedicine).  Patients are able to view lab/test results, encounter notes, upcoming appointments, etc.  Non-urgent messages can be sent to your provider as well.   To learn more about what you can do with MyChart, go to [email or web address]    Your next appointment:   3 week(s)  The format for your next appointment:   In Person  Provider:   You may see TIda Rogue MD or one of the following Advanced Practice Providers on your designated Care Team:    CMurray Hodgkins NP  RChristell Faith PA-C  JMarrianne Mood PA-C  CLaurann Montana NP  Other  Instructions  You have an irregular heart rhythm called atrial fibrillation. This is very common after surgery. Your Amiodarone will help to try to get you out of this rhythm. Your Metoprolol will help control your heart rate. Your Eliquis will help protect from stroke.   Call uKoreaif you have any blood in your urine or stool (this could be black or red)  Keep up the good work with therapy!

## 2019-11-15 LAB — BASIC METABOLIC PANEL
BUN/Creatinine Ratio: 19 (ref 10–24)
BUN: 33 mg/dL — ABNORMAL HIGH (ref 8–27)
CO2: 22 mmol/L (ref 20–29)
Calcium: 8.6 mg/dL (ref 8.6–10.2)
Chloride: 101 mmol/L (ref 96–106)
Creatinine, Ser: 1.75 mg/dL — ABNORMAL HIGH (ref 0.76–1.27)
GFR calc Af Amer: 48 mL/min/{1.73_m2} — ABNORMAL LOW (ref >59)
GFR calc non Af Amer: 41 mL/min/{1.73_m2} — ABNORMAL LOW (ref >59)
Glucose: 112 mg/dL — ABNORMAL HIGH (ref 65–99)
Potassium: 4.8 mmol/L (ref 3.5–5.2)
Sodium: 137 mmol/L (ref 134–144)

## 2019-11-15 LAB — CBC
Hematocrit: 28.3 % — ABNORMAL LOW (ref 37.5–51.0)
Hemoglobin: 9.3 g/dL — ABNORMAL LOW (ref 13.0–17.7)
MCH: 30.8 pg (ref 26.6–33.0)
MCHC: 32.9 g/dL (ref 31.5–35.7)
MCV: 94 fL (ref 79–97)
Platelets: 209 10*3/uL (ref 150–450)
RBC: 3.02 x10E6/uL — ABNORMAL LOW (ref 4.14–5.80)
RDW: 16.2 % — ABNORMAL HIGH (ref 11.6–15.4)
WBC: 9.4 10*3/uL (ref 3.4–10.8)

## 2019-11-15 LAB — TSH: TSH: 3.42 u[IU]/mL (ref 0.450–4.500)

## 2019-11-15 NOTE — Progress Notes (Signed)
[address]       [place] [address]             [phone]     CARDIOTHORACIC SURGERY OFFICE NOTE  Referring Provider is Nahser, Wonda Cheng, MD Primary Cardiologist is Ida Rogue, MD PCP is [facility] [employer]   HPI:  59 year old man recently underwent CABG.  He did reasonably well in the hospital although his lungs were an issue initially.  He was discharged to a skilled nursing facility and recently had returns for initial visit after discharge.  He has no complaints of chest pain or shortness of breath   Current Outpatient Medications  Medication Sig Dispense Refill  . acetaminophen (TYLENOL) 500 MG tablet Take 1,000 mg by mouth daily as needed.    Marland Kitchen apixaban (ELIQUIS) 5 MG TABS tablet Take 1 tablet (5 mg total) by mouth 2 (two) times daily. 60 tablet   . aspirin 81 MG chewable tablet Chew 1 tablet (81 mg total) by mouth daily. 30 tablet 1  . atorvastatin (LIPITOR) 80 MG tablet Take 1 tablet (80 mg total) by mouth daily. 90 tablet 3  . ezetimibe (ZETIA) 10 MG tablet Take 1 tablet (10 mg total) by mouth daily. 90 tablet 3  . famotidine (PEPCID) 20 MG tablet Take 1 tablet (20 mg total) by mouth 2 (two) times daily. (Patient taking differently: Take 20 mg by mouth daily as needed. ) 8 tablet 0  . furosemide (LASIX) 40 MG tablet Take 1 tablet (40 mg total) by mouth daily. 7 tablet 0  . insulin aspart (NOVOLOG) 100 UNIT/ML injection Inject 0-15 Units into the skin 4 (four) times daily - after meals and at bedtime. (Patient taking differently: Inject 0-15 Units into the skin 4 (four) times daily - after meals and at bedtime. Sliding scale) 10 mL 11  . insulin aspart (NOVOLOG) 100 UNIT/ML injection Inject 10 Units into the skin 3 (three) times daily with meals. 10 mL 11  . insulin glargine (LANTUS) 100 UNIT/ML injection Inject 0.05 mLs (5 Units total) into the skin daily. 10 mL 11  . isosorbide dinitrate (ISORDIL) 20 MG tablet Take 1 tablet (20 mg total) by  mouth 3 (three) times daily. For 1 month, then discontinue    . levETIRAcetam (KEPPRA) 500 MG tablet Take 500 mg by mouth 3 (three) times daily.    . metFORMIN (GLUCOPHAGE) 500 MG tablet Take 500 mg by mouth 2 (two) times daily with a meal.    . Metoprolol Tartrate 37.5 MG TABS Take 37.5 mg by mouth 2 (two) times daily.    . nitroGLYCERIN (NITROSTAT) 0.4 MG SL tablet Place 0.4 mg under the tongue every 5 (five) minutes as needed for chest pain.    Marland Kitchen oxyCODONE (OXY IR/ROXICODONE) 5 MG immediate release tablet Take 1-2 tablets (5-10 mg total) by mouth every 4 (four) hours as needed for severe pain. 30 tablet 0  . pantoprazole (PROTONIX) 40 MG tablet Take 1 tablet (40 mg total) by mouth daily. 30 tablet 0  . potassium chloride SA (KLOR-CON) 20 MEQ tablet Take 1 tablet (20 mEq total) by mouth daily. For 7 days    . pregabalin (LYRICA) 50 MG capsule Take 1 capsule (50 mg total) by mouth 3 (three) times daily. 90 capsule 2  . venlafaxine (EFFEXOR) 75 MG tablet Take 75 mg by mouth 3 (three) times daily.    Marland Kitchen amiodarone (PACERONE) 200 MG tablet Take 1 tablet (200 mg total) by  mouth daily.     No current facility-administered medications for this visit.      Physical Exam:   BP 102/65   Pulse 84   Temp 97.6 F (36.4 C) (Skin)   Resp 20   Ht 5' 7"  (1.702 m)   Wt 83.5 kg   SpO2 97% Comment: 2L O2 per [place]  BMI 28.82 kg/m   General:  Chronically ill appearing no acute distress  Chest:   Regular rate and rhythm  CV:   Clear to auscultation  Incisions:  Healing well  Abdomen:  Soft  Extremities:  No edema  Diagnostic Tests:  Chest x-ray with clear lung fields   Impression:  Doing well after CABG  Plan:  Follow up in 2 to 3 weeks Continue present therapy in the meantime. Continue to observe sternal precautions  I spent in excess of 20 minutes during the conduct of this office consultation and >50% of this time involved direct face-to-face encounter with the patient for counseling  and/or coordination of their care.  Level 2                 10 minutes Level 3                 15 minutes Level 4                 25 minutes Level 5                 40 minutes  B. Murvin Natal, MD 11/15/2019 9:15 AM

## 2019-11-23 ENCOUNTER — Other Ambulatory Visit: Payer: Self-pay

## 2019-11-23 ENCOUNTER — Encounter: Payer: Self-pay | Admitting: Family Medicine

## 2019-11-23 ENCOUNTER — Emergency Department: Payer: Medicare HMO

## 2019-11-23 ENCOUNTER — Inpatient Hospital Stay
Admission: EM | Admit: 2019-11-23 | Discharge: 2019-12-12 | DRG: 291 | Disposition: E | Payer: Medicare HMO | Attending: Pulmonary Disease | Admitting: Pulmonary Disease

## 2019-11-23 DIAGNOSIS — Z7901 Long term (current) use of anticoagulants: Secondary | ICD-10-CM

## 2019-11-23 DIAGNOSIS — Z951 Presence of aortocoronary bypass graft: Secondary | ICD-10-CM

## 2019-11-23 DIAGNOSIS — J9621 Acute and chronic respiratory failure with hypoxia: Secondary | ICD-10-CM | POA: Diagnosis present

## 2019-11-23 DIAGNOSIS — G9340 Encephalopathy, unspecified: Secondary | ICD-10-CM

## 2019-11-23 DIAGNOSIS — I471 Supraventricular tachycardia: Secondary | ICD-10-CM | POA: Diagnosis not present

## 2019-11-23 DIAGNOSIS — K819 Cholecystitis, unspecified: Secondary | ICD-10-CM | POA: Diagnosis not present

## 2019-11-23 DIAGNOSIS — R791 Abnormal coagulation profile: Secondary | ICD-10-CM | POA: Diagnosis not present

## 2019-11-23 DIAGNOSIS — R29711 NIHSS score 11: Secondary | ICD-10-CM | POA: Diagnosis not present

## 2019-11-23 DIAGNOSIS — G9341 Metabolic encephalopathy: Secondary | ICD-10-CM | POA: Diagnosis present

## 2019-11-23 DIAGNOSIS — R4189 Other symptoms and signs involving cognitive functions and awareness: Secondary | ICD-10-CM | POA: Diagnosis present

## 2019-11-23 DIAGNOSIS — I5033 Acute on chronic diastolic (congestive) heart failure: Secondary | ICD-10-CM | POA: Diagnosis not present

## 2019-11-23 DIAGNOSIS — I639 Cerebral infarction, unspecified: Secondary | ICD-10-CM | POA: Diagnosis not present

## 2019-11-23 DIAGNOSIS — E1122 Type 2 diabetes mellitus with diabetic chronic kidney disease: Secondary | ICD-10-CM | POA: Diagnosis present

## 2019-11-23 DIAGNOSIS — J811 Chronic pulmonary edema: Secondary | ICD-10-CM | POA: Diagnosis present

## 2019-11-23 DIAGNOSIS — D500 Iron deficiency anemia secondary to blood loss (chronic): Secondary | ICD-10-CM | POA: Diagnosis not present

## 2019-11-23 DIAGNOSIS — J432 Centrilobular emphysema: Secondary | ICD-10-CM | POA: Diagnosis not present

## 2019-11-23 DIAGNOSIS — R29703 NIHSS score 3: Secondary | ICD-10-CM | POA: Diagnosis present

## 2019-11-23 DIAGNOSIS — J984 Other disorders of lung: Secondary | ICD-10-CM

## 2019-11-23 DIAGNOSIS — I5043 Acute on chronic combined systolic (congestive) and diastolic (congestive) heart failure: Secondary | ICD-10-CM | POA: Diagnosis present

## 2019-11-23 DIAGNOSIS — I63432 Cerebral infarction due to embolism of left posterior cerebral artery: Secondary | ICD-10-CM | POA: Diagnosis not present

## 2019-11-23 DIAGNOSIS — Y92129 Unspecified place in nursing home as the place of occurrence of the external cause: Secondary | ICD-10-CM | POA: Diagnosis not present

## 2019-11-23 DIAGNOSIS — G8929 Other chronic pain: Secondary | ICD-10-CM | POA: Diagnosis present

## 2019-11-23 DIAGNOSIS — G8191 Hemiplegia, unspecified affecting right dominant side: Secondary | ICD-10-CM | POA: Diagnosis not present

## 2019-11-23 DIAGNOSIS — R1011 Right upper quadrant pain: Secondary | ICD-10-CM

## 2019-11-23 DIAGNOSIS — J80 Acute respiratory distress syndrome: Secondary | ICD-10-CM | POA: Clinically undetermined

## 2019-11-23 DIAGNOSIS — I493 Ventricular premature depolarization: Secondary | ICD-10-CM | POA: Diagnosis present

## 2019-11-23 DIAGNOSIS — J440 Chronic obstructive pulmonary disease with acute lower respiratory infection: Secondary | ICD-10-CM | POA: Diagnosis present

## 2019-11-23 DIAGNOSIS — I739 Peripheral vascular disease, unspecified: Secondary | ICD-10-CM | POA: Diagnosis not present

## 2019-11-23 DIAGNOSIS — K3189 Other diseases of stomach and duodenum: Secondary | ICD-10-CM | POA: Diagnosis present

## 2019-11-23 DIAGNOSIS — R0603 Acute respiratory distress: Secondary | ICD-10-CM

## 2019-11-23 DIAGNOSIS — R4701 Aphasia: Secondary | ICD-10-CM | POA: Diagnosis not present

## 2019-11-23 DIAGNOSIS — I252 Old myocardial infarction: Secondary | ICD-10-CM

## 2019-11-23 DIAGNOSIS — J189 Pneumonia, unspecified organism: Secondary | ICD-10-CM | POA: Diagnosis present

## 2019-11-23 DIAGNOSIS — I25118 Atherosclerotic heart disease of native coronary artery with other forms of angina pectoris: Secondary | ICD-10-CM | POA: Diagnosis present

## 2019-11-23 DIAGNOSIS — Z66 Do not resuscitate: Secondary | ICD-10-CM | POA: Diagnosis not present

## 2019-11-23 DIAGNOSIS — K219 Gastro-esophageal reflux disease without esophagitis: Secondary | ICD-10-CM | POA: Diagnosis present

## 2019-11-23 DIAGNOSIS — I6389 Other cerebral infarction: Secondary | ICD-10-CM | POA: Diagnosis not present

## 2019-11-23 DIAGNOSIS — N179 Acute kidney failure, unspecified: Secondary | ICD-10-CM

## 2019-11-23 DIAGNOSIS — R6520 Severe sepsis without septic shock: Secondary | ICD-10-CM | POA: Diagnosis not present

## 2019-11-23 DIAGNOSIS — R410 Disorientation, unspecified: Secondary | ICD-10-CM | POA: Diagnosis not present

## 2019-11-23 DIAGNOSIS — Z87891 Personal history of nicotine dependence: Secondary | ICD-10-CM

## 2019-11-23 DIAGNOSIS — Z01818 Encounter for other preprocedural examination: Secondary | ICD-10-CM

## 2019-11-23 DIAGNOSIS — R7401 Elevation of levels of liver transaminase levels: Secondary | ICD-10-CM

## 2019-11-23 DIAGNOSIS — Z8673 Personal history of transient ischemic attack (TIA), and cerebral infarction without residual deficits: Secondary | ICD-10-CM

## 2019-11-23 DIAGNOSIS — I2510 Atherosclerotic heart disease of native coronary artery without angina pectoris: Secondary | ICD-10-CM | POA: Diagnosis present

## 2019-11-23 DIAGNOSIS — J9600 Acute respiratory failure, unspecified whether with hypoxia or hypercapnia: Secondary | ICD-10-CM

## 2019-11-23 DIAGNOSIS — N1832 Chronic kidney disease, stage 3b: Secondary | ICD-10-CM | POA: Diagnosis present

## 2019-11-23 DIAGNOSIS — Z888 Allergy status to other drugs, medicaments and biological substances status: Secondary | ICD-10-CM

## 2019-11-23 DIAGNOSIS — I130 Hypertensive heart and chronic kidney disease with heart failure and stage 1 through stage 4 chronic kidney disease, or unspecified chronic kidney disease: Principal | ICD-10-CM | POA: Diagnosis present

## 2019-11-23 DIAGNOSIS — G4733 Obstructive sleep apnea (adult) (pediatric): Secondary | ICD-10-CM | POA: Diagnosis present

## 2019-11-23 DIAGNOSIS — Z20822 Contact with and (suspected) exposure to covid-19: Secondary | ICD-10-CM | POA: Diagnosis present

## 2019-11-23 DIAGNOSIS — R1112 Projectile vomiting: Secondary | ICD-10-CM | POA: Diagnosis not present

## 2019-11-23 DIAGNOSIS — R29705 NIHSS score 5: Secondary | ICD-10-CM | POA: Diagnosis not present

## 2019-11-23 DIAGNOSIS — I10 Essential (primary) hypertension: Secondary | ICD-10-CM | POA: Diagnosis present

## 2019-11-23 DIAGNOSIS — E669 Obesity, unspecified: Secondary | ICD-10-CM | POA: Diagnosis present

## 2019-11-23 DIAGNOSIS — I5189 Other ill-defined heart diseases: Secondary | ICD-10-CM

## 2019-11-23 DIAGNOSIS — E785 Hyperlipidemia, unspecified: Secondary | ICD-10-CM | POA: Diagnosis present

## 2019-11-23 DIAGNOSIS — J9622 Acute and chronic respiratory failure with hypercapnia: Secondary | ICD-10-CM | POA: Diagnosis present

## 2019-11-23 DIAGNOSIS — R29810 Facial weakness: Secondary | ICD-10-CM | POA: Diagnosis not present

## 2019-11-23 DIAGNOSIS — A419 Sepsis, unspecified organism: Secondary | ICD-10-CM | POA: Diagnosis not present

## 2019-11-23 DIAGNOSIS — Z452 Encounter for adjustment and management of vascular access device: Secondary | ICD-10-CM

## 2019-11-23 DIAGNOSIS — K810 Acute cholecystitis: Secondary | ICD-10-CM | POA: Diagnosis present

## 2019-11-23 DIAGNOSIS — E1151 Type 2 diabetes mellitus with diabetic peripheral angiopathy without gangrene: Secondary | ICD-10-CM | POA: Diagnosis present

## 2019-11-23 DIAGNOSIS — R29704 NIHSS score 4: Secondary | ICD-10-CM | POA: Diagnosis not present

## 2019-11-23 DIAGNOSIS — F329 Major depressive disorder, single episode, unspecified: Secondary | ICD-10-CM | POA: Diagnosis present

## 2019-11-23 DIAGNOSIS — I469 Cardiac arrest, cause unspecified: Secondary | ICD-10-CM | POA: Diagnosis not present

## 2019-11-23 DIAGNOSIS — E118 Type 2 diabetes mellitus with unspecified complications: Secondary | ICD-10-CM | POA: Diagnosis not present

## 2019-11-23 DIAGNOSIS — E8881 Metabolic syndrome: Secondary | ICD-10-CM

## 2019-11-23 DIAGNOSIS — Z7982 Long term (current) use of aspirin: Secondary | ICD-10-CM

## 2019-11-23 DIAGNOSIS — Y831 Surgical operation with implant of artificial internal device as the cause of abnormal reaction of the patient, or of later complication, without mention of misadventure at the time of the procedure: Secondary | ICD-10-CM | POA: Diagnosis present

## 2019-11-23 DIAGNOSIS — Z823 Family history of stroke: Secondary | ICD-10-CM

## 2019-11-23 DIAGNOSIS — R0602 Shortness of breath: Secondary | ICD-10-CM | POA: Diagnosis present

## 2019-11-23 DIAGNOSIS — W19XXXA Unspecified fall, initial encounter: Secondary | ICD-10-CM | POA: Diagnosis present

## 2019-11-23 DIAGNOSIS — I340 Nonrheumatic mitral (valve) insufficiency: Secondary | ICD-10-CM | POA: Diagnosis not present

## 2019-11-23 DIAGNOSIS — E1140 Type 2 diabetes mellitus with diabetic neuropathy, unspecified: Secondary | ICD-10-CM | POA: Diagnosis present

## 2019-11-23 DIAGNOSIS — I509 Heart failure, unspecified: Secondary | ICD-10-CM | POA: Diagnosis not present

## 2019-11-23 DIAGNOSIS — Z8249 Family history of ischemic heart disease and other diseases of the circulatory system: Secondary | ICD-10-CM

## 2019-11-23 DIAGNOSIS — R109 Unspecified abdominal pain: Secondary | ICD-10-CM

## 2019-11-23 DIAGNOSIS — I63331 Cerebral infarction due to thrombosis of right posterior cerebral artery: Secondary | ICD-10-CM | POA: Diagnosis not present

## 2019-11-23 DIAGNOSIS — J9690 Respiratory failure, unspecified, unspecified whether with hypoxia or hypercapnia: Secondary | ICD-10-CM

## 2019-11-23 DIAGNOSIS — I361 Nonrheumatic tricuspid (valve) insufficiency: Secondary | ICD-10-CM | POA: Diagnosis not present

## 2019-11-23 DIAGNOSIS — T82855A Stenosis of coronary artery stent, initial encounter: Secondary | ICD-10-CM | POA: Diagnosis present

## 2019-11-23 DIAGNOSIS — K7581 Nonalcoholic steatohepatitis (NASH): Secondary | ICD-10-CM | POA: Diagnosis present

## 2019-11-23 DIAGNOSIS — D631 Anemia in chronic kidney disease: Secondary | ICD-10-CM | POA: Diagnosis present

## 2019-11-23 DIAGNOSIS — E876 Hypokalemia: Secondary | ICD-10-CM | POA: Diagnosis present

## 2019-11-23 DIAGNOSIS — Z79899 Other long term (current) drug therapy: Secondary | ICD-10-CM

## 2019-11-23 DIAGNOSIS — Z794 Long term (current) use of insulin: Secondary | ICD-10-CM

## 2019-11-23 DIAGNOSIS — R569 Unspecified convulsions: Secondary | ICD-10-CM | POA: Diagnosis present

## 2019-11-23 DIAGNOSIS — K7460 Unspecified cirrhosis of liver: Secondary | ICD-10-CM | POA: Diagnosis present

## 2019-11-23 DIAGNOSIS — R9401 Abnormal electroencephalogram [EEG]: Secondary | ICD-10-CM | POA: Diagnosis present

## 2019-11-23 DIAGNOSIS — I4819 Other persistent atrial fibrillation: Secondary | ICD-10-CM

## 2019-11-23 DIAGNOSIS — N35911 Unspecified urethral stricture, male, meatal: Secondary | ICD-10-CM | POA: Diagnosis present

## 2019-11-23 DIAGNOSIS — J90 Pleural effusion, not elsewhere classified: Secondary | ICD-10-CM

## 2019-11-23 DIAGNOSIS — Z6831 Body mass index (BMI) 31.0-31.9, adult: Secondary | ICD-10-CM

## 2019-11-23 LAB — COMPREHENSIVE METABOLIC PANEL
ALT: 13 U/L (ref 0–44)
AST: 19 U/L (ref 15–41)
Albumin: 3.1 g/dL — ABNORMAL LOW (ref 3.5–5.0)
Alkaline Phosphatase: 100 U/L (ref 38–126)
Anion gap: 11 (ref 5–15)
BUN: 34 mg/dL — ABNORMAL HIGH (ref 8–23)
CO2: 25 mmol/L (ref 22–32)
Calcium: 8.9 mg/dL (ref 8.9–10.3)
Chloride: 103 mmol/L (ref 98–111)
Creatinine, Ser: 1.72 mg/dL — ABNORMAL HIGH (ref 0.61–1.24)
GFR calc Af Amer: 49 mL/min — ABNORMAL LOW (ref >60)
GFR calc non Af Amer: 42 mL/min — ABNORMAL LOW (ref >60)
Glucose, Bld: 180 mg/dL — ABNORMAL HIGH (ref 70–99)
Potassium: 4.5 mmol/L (ref 3.5–5.1)
Sodium: 139 mmol/L (ref 135–145)
Total Bilirubin: 1 mg/dL (ref 0.3–1.2)
Total Protein: 6.9 g/dL (ref 6.5–8.1)

## 2019-11-23 LAB — CBC WITH DIFFERENTIAL/PLATELET
Abs Immature Granulocytes: 0.04 10*3/uL (ref 0.00–0.07)
Basophils Absolute: 0 10*3/uL (ref 0.0–0.1)
Basophils Relative: 1 %
Eosinophils Absolute: 0.3 10*3/uL (ref 0.0–0.5)
Eosinophils Relative: 3 %
HCT: 32 % — ABNORMAL LOW (ref 39.0–52.0)
Hemoglobin: 9.9 g/dL — ABNORMAL LOW (ref 13.0–17.0)
Immature Granulocytes: 1 %
Lymphocytes Relative: 7 %
Lymphs Abs: 0.6 10*3/uL — ABNORMAL LOW (ref 0.7–4.0)
MCH: 30 pg (ref 26.0–34.0)
MCHC: 30.9 g/dL (ref 30.0–36.0)
MCV: 97 fL (ref 80.0–100.0)
Monocytes Absolute: 0.7 10*3/uL (ref 0.1–1.0)
Monocytes Relative: 7 %
Neutro Abs: 7.3 10*3/uL (ref 1.7–7.7)
Neutrophils Relative %: 81 %
Platelets: 215 10*3/uL (ref 150–400)
RBC: 3.3 MIL/uL — ABNORMAL LOW (ref 4.22–5.81)
RDW: 19.5 % — ABNORMAL HIGH (ref 11.5–15.5)
WBC: 8.8 10*3/uL (ref 4.0–10.5)
nRBC: 0 % (ref 0.0–0.2)

## 2019-11-23 LAB — TROPONIN I (HIGH SENSITIVITY)
Troponin I (High Sensitivity): 10 ng/L (ref <18)
Troponin I (High Sensitivity): 10 ng/L (ref <18)

## 2019-11-23 LAB — BLOOD GAS, ARTERIAL
Acid-Base Excess: 2.7 mmol/L — ABNORMAL HIGH (ref 0.0–2.0)
Bicarbonate: 27.9 mmol/L (ref 20.0–28.0)
FIO2: 0.36
O2 Saturation: 93.7 %
Patient temperature: 37
pCO2 arterial: 44 mmHg (ref 32.0–48.0)
pH, Arterial: 7.41 (ref 7.350–7.450)
pO2, Arterial: 69 mmHg — ABNORMAL LOW (ref 83.0–108.0)

## 2019-11-23 LAB — PROTIME-INR
INR: 1.9 — ABNORMAL HIGH (ref 0.8–1.2)
Prothrombin Time: 20.8 seconds — ABNORMAL HIGH (ref 11.4–15.2)

## 2019-11-23 LAB — LACTIC ACID, PLASMA: Lactic Acid, Venous: 1.7 mmol/L (ref 0.5–1.9)

## 2019-11-23 LAB — AMMONIA: Ammonia: <9 umol/L — ABNORMAL LOW (ref 9–35)

## 2019-11-23 LAB — SARS CORONAVIRUS 2 BY RT PCR (HOSPITAL ORDER, PERFORMED IN ~~LOC~~ HOSPITAL LAB): SARS Coronavirus 2: NEGATIVE

## 2019-11-23 LAB — PROCALCITONIN: Procalcitonin: <0.1 ng/mL

## 2019-11-23 LAB — GLUCOSE, CAPILLARY: Glucose-Capillary: 155 mg/dL — ABNORMAL HIGH (ref 70–99)

## 2019-11-23 LAB — BRAIN NATRIURETIC PEPTIDE: B Natriuretic Peptide: 273 pg/mL — ABNORMAL HIGH (ref 0.0–100.0)

## 2019-11-23 LAB — TSH: TSH: 2.583 u[IU]/mL (ref 0.350–4.500)

## 2019-11-23 MED ORDER — ACETAMINOPHEN 325 MG PO TABS
650.0000 mg | ORAL_TABLET | Freq: Four times a day (QID) | ORAL | Status: DC | PRN
  Administered 2019-11-25: 650 mg via ORAL
  Filled 2019-11-23: qty 2

## 2019-11-23 MED ORDER — PANTOPRAZOLE SODIUM 40 MG PO TBEC
40.0000 mg | DELAYED_RELEASE_TABLET | Freq: Every day | ORAL | Status: DC
  Administered 2019-11-24 – 2019-11-30 (×7): 40 mg via ORAL
  Filled 2019-11-23 (×7): qty 1

## 2019-11-23 MED ORDER — AMIODARONE HCL 200 MG PO TABS
200.0000 mg | ORAL_TABLET | Freq: Every day | ORAL | Status: DC
  Administered 2019-11-24 – 2019-11-30 (×7): 200 mg via ORAL
  Filled 2019-11-23 (×7): qty 1

## 2019-11-23 MED ORDER — FAMOTIDINE 20 MG PO TABS
20.0000 mg | ORAL_TABLET | Freq: Two times a day (BID) | ORAL | Status: DC
  Administered 2019-11-23 – 2019-11-30 (×14): 20 mg via ORAL
  Filled 2019-11-23 (×14): qty 1

## 2019-11-23 MED ORDER — EZETIMIBE 10 MG PO TABS
10.0000 mg | ORAL_TABLET | Freq: Every day | ORAL | Status: DC
  Administered 2019-11-24 – 2019-11-30 (×6): 10 mg via ORAL
  Filled 2019-11-23 (×9): qty 1

## 2019-11-23 MED ORDER — INSULIN ASPART 100 UNIT/ML ~~LOC~~ SOLN: 0.0000 [IU] | Freq: Every day | SUBCUTANEOUS | Status: DC

## 2019-11-23 MED ORDER — ORAL CARE MOUTH RINSE
15.0000 mL | Freq: Two times a day (BID) | OROMUCOSAL | Status: DC
  Administered 2019-11-24 – 2019-12-02 (×15): 15 mL via OROMUCOSAL

## 2019-11-23 MED ORDER — SODIUM CHLORIDE 0.9 % IV SOLN
2.0000 g | INTRAVENOUS | Status: DC
  Administered 2019-11-23: 2 g via INTRAVENOUS
  Filled 2019-11-23: qty 2
  Filled 2019-11-23: qty 20

## 2019-11-23 MED ORDER — METOPROLOL TARTRATE 25 MG PO TABS
37.5000 mg | ORAL_TABLET | Freq: Two times a day (BID) | ORAL | Status: DC
  Administered 2019-11-23 – 2019-11-30 (×14): 37.5 mg via ORAL
  Filled 2019-11-23 (×14): qty 2

## 2019-11-23 MED ORDER — PREGABALIN 50 MG PO CAPS
50.0000 mg | ORAL_CAPSULE | Freq: Three times a day (TID) | ORAL | Status: DC
  Administered 2019-11-23 – 2019-12-01 (×20): 50 mg via ORAL
  Filled 2019-11-23 (×20): qty 1

## 2019-11-23 MED ORDER — APIXABAN 5 MG PO TABS
5.0000 mg | ORAL_TABLET | Freq: Two times a day (BID) | ORAL | Status: DC
  Administered 2019-11-23 – 2019-11-30 (×13): 5 mg via ORAL
  Filled 2019-11-23 (×13): qty 1

## 2019-11-23 MED ORDER — ASPIRIN 81 MG PO CHEW
81.0000 mg | CHEWABLE_TABLET | Freq: Every day | ORAL | Status: DC
  Administered 2019-11-24 – 2019-11-30 (×6): 81 mg via ORAL
  Filled 2019-11-23 (×6): qty 1

## 2019-11-23 MED ORDER — VENLAFAXINE HCL 37.5 MG PO TABS
75.0000 mg | ORAL_TABLET | Freq: Three times a day (TID) | ORAL | Status: DC
  Administered 2019-11-23 – 2019-12-01 (×18): 75 mg via ORAL
  Filled 2019-11-23 (×32): qty 2

## 2019-11-23 MED ORDER — FUROSEMIDE 10 MG/ML IJ SOLN
40.0000 mg | Freq: Once | INTRAMUSCULAR | Status: AC
  Administered 2019-11-23: 40 mg via INTRAVENOUS
  Filled 2019-11-23: qty 4

## 2019-11-23 MED ORDER — INSULIN GLARGINE 100 UNIT/ML ~~LOC~~ SOLN
5.0000 [IU] | Freq: Every day | SUBCUTANEOUS | Status: DC
  Administered 2019-11-24 – 2019-12-03 (×9): 5 [IU] via SUBCUTANEOUS
  Filled 2019-11-23 (×12): qty 0.05

## 2019-11-23 MED ORDER — INSULIN ASPART 100 UNIT/ML ~~LOC~~ SOLN
0.0000 [IU] | Freq: Three times a day (TID) | SUBCUTANEOUS | Status: DC
  Administered 2019-11-24: 5 [IU] via SUBCUTANEOUS
  Administered 2019-11-24 (×2): 2 [IU] via SUBCUTANEOUS
  Administered 2019-11-25: 5 [IU] via SUBCUTANEOUS
  Administered 2019-11-25: 3 [IU] via SUBCUTANEOUS
  Administered 2019-11-25: 5 [IU] via SUBCUTANEOUS
  Administered 2019-11-26: 2 [IU] via SUBCUTANEOUS
  Administered 2019-11-26 (×2): 3 [IU] via SUBCUTANEOUS
  Administered 2019-11-27: 5 [IU] via SUBCUTANEOUS
  Administered 2019-11-27: 8 [IU] via SUBCUTANEOUS
  Administered 2019-11-27: 3 [IU] via SUBCUTANEOUS
  Administered 2019-11-28: 2 [IU] via SUBCUTANEOUS
  Administered 2019-11-28: 3 [IU] via SUBCUTANEOUS
  Administered 2019-11-28 – 2019-11-30 (×5): 5 [IU] via SUBCUTANEOUS
  Administered 2019-11-30 – 2019-12-01 (×2): 3 [IU] via SUBCUTANEOUS
  Administered 2019-12-01: 2 [IU] via SUBCUTANEOUS
  Administered 2019-12-02: 3 [IU] via SUBCUTANEOUS
  Administered 2019-12-02: 2 [IU] via SUBCUTANEOUS
  Administered 2019-12-03: 3 [IU] via SUBCUTANEOUS
  Administered 2019-12-03: 2 [IU] via SUBCUTANEOUS
  Filled 2019-11-23 (×25): qty 1

## 2019-11-23 MED ORDER — OXYCODONE HCL 5 MG PO TABS
5.0000 mg | ORAL_TABLET | ORAL | Status: DC | PRN
  Administered 2019-11-24: 10 mg via ORAL
  Administered 2019-11-24: 5 mg via ORAL
  Administered 2019-11-25: 10 mg via ORAL
  Filled 2019-11-23: qty 1
  Filled 2019-11-23 (×2): qty 2

## 2019-11-23 MED ORDER — LEVETIRACETAM 500 MG PO TABS
500.0000 mg | ORAL_TABLET | Freq: Three times a day (TID) | ORAL | Status: DC
  Administered 2019-11-23 – 2019-11-30 (×20): 500 mg via ORAL
  Filled 2019-11-23 (×22): qty 1

## 2019-11-23 MED ORDER — FUROSEMIDE 10 MG/ML IJ SOLN
40.0000 mg | Freq: Two times a day (BID) | INTRAMUSCULAR | Status: DC
  Administered 2019-11-23 – 2019-11-24 (×2): 40 mg via INTRAVENOUS
  Filled 2019-11-23 (×2): qty 4

## 2019-11-23 MED ORDER — ATORVASTATIN CALCIUM 20 MG PO TABS
80.0000 mg | ORAL_TABLET | Freq: Every evening | ORAL | Status: DC
  Administered 2019-11-23 – 2019-12-01 (×7): 80 mg via ORAL
  Filled 2019-11-23 (×4): qty 1
  Filled 2019-11-23: qty 4
  Filled 2019-11-23 (×3): qty 1

## 2019-11-23 MED ORDER — ONDANSETRON HCL 4 MG/2ML IJ SOLN
4.0000 mg | Freq: Four times a day (QID) | INTRAMUSCULAR | Status: DC | PRN
  Administered 2019-11-30 (×2): 4 mg via INTRAVENOUS
  Filled 2019-11-23 (×3): qty 2

## 2019-11-23 MED ORDER — ACETAMINOPHEN 650 MG RE SUPP: 650.0000 mg | Freq: Four times a day (QID) | RECTAL | Status: DC | PRN

## 2019-11-23 MED ORDER — SODIUM CHLORIDE 0.9 % IV SOLN
500.0000 mg | INTRAVENOUS | Status: DC
  Administered 2019-11-23: 500 mg via INTRAVENOUS
  Filled 2019-11-23 (×2): qty 500

## 2019-11-23 MED ORDER — ONDANSETRON HCL 4 MG PO TABS: 4.0000 mg | ORAL_TABLET | Freq: Four times a day (QID) | ORAL | Status: DC | PRN

## 2019-11-23 MED ORDER — FUROSEMIDE 40 MG PO TABS: 40.0000 mg | ORAL_TABLET | Freq: Two times a day (BID) | ORAL | 0 refills | Status: AC

## 2019-11-23 NOTE — ED Triage Notes (Signed)
Pt here from [facility] after fall and hit his head. Small bruise on left side on head. Pt also c/o of SOB, wears 3L at home. Hx of CABG in April, afib, and aphasia. Staff also concerned with pt's pale skin color. Pt in NAD.

## 2019-11-23 NOTE — ED Provider Notes (Signed)
[facility] Emergency Department Provider Note  ____________________________________________  Time seen: Approximately 2:41 PM  I have reviewed the triage vital signs and the nursing notes.   HISTORY  Chief Complaint Shortness of Breath and Fall    HPI Larry Terrell is a 59 y.o. male with a history of CHF cirrhosis COPD diabetes who is brought to the ED due to a fall resulting in blunt head trauma.  Patient denies any preceding symptoms, states he feels fine.   Denies neck pain chest pain or acute shortness of breath.  Denies palpitations headache vision changes weakness or abdominal pain or back pain.     Past Medical History:  Diagnosis Date  . CHF (congestive heart failure) (HCC)    diastolic (echo at [facility] 8657) EF 55-60%  . Cirrhosis ([place])    NASH per records  . COPD (chronic obstructive pulmonary disease) ([place])   . Depression   . Diabetes mellitus with neuropathy ([place])   . Heart attack ([place])   . Neuropathy   . OSA (obstructive sleep apnea)   . Peripheral vascular disease due to secondary diabetes mellitus ([place])   . Splenomegaly   . 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 TIA (transient ischemic attack) April 2016     Patient Active Problem List   Diagnosis Date Noted  . Open harvest of left radial artery 10/27/2019  . S/P CABG x 5   . CAD in native artery 10/20/2019  . Unstable angina ([place]) 10/20/2019  . (HFpEF) heart failure with preserved ejection fraction ([place]) 10/20/2019  . Essential hypertension 10/20/2019  . Hyperlipidemia LDL goal <70 10/20/2019  . PAD (peripheral artery disease) ([place]) 10/20/2019  . Chest pain 10/18/2019  . Altered mental status 06/04/2019  . Sepsis ([place]) 06/04/2019  . AKI (acute kidney injury) ([place]) 06/04/2019  . GERD (gastroesophageal reflux disease) 06/04/2019  . History of seizure 06/04/2019  . Stroke ([place]) 04/01/2019  . Acute appendicitis 07/10/2015  . S/P laparoscopic appendectomy 07/10/2015  .  Appendicitis, acute   . Diabetes mellitus with complication ([place])   . Chronic congestive heart failure with left ventricular diastolic dysfunction ([place])   . Lactic acidosis   . Acute respiratory failure with hypoxia [facility])      Past Surgical History:  Procedure Laterality Date  . AORTA - FEMORAL ARTERY BYPASS GRAFT Bilateral   . CARDIAC CATHETERIZATION    . cardiac stents  July 2015   . CORONARY ARTERY BYPASS GRAFT N/A 10/26/2019   Procedure: CORONARY ARTERY BYPASS GRAFTING (CABG) x 5, Using Bilateral mammary arteries, and right leg greater saphenous vein harvested endoscopically;  Surgeon: Wonda Olds, MD;  Location: [place];  Service: Open Heart Surgery;  Laterality: N/A;  . CORONARY STENT INTERVENTION N/A 08/09/2017   Procedure: CORONARY STENT INTERVENTION;  Surgeon: [place] Hampshire, MD;  Location: [place] CV LAB;  Service: Cardiovascular;  Laterality: N/A;  . LAPAROSCOPIC APPENDECTOMY N/A 07/10/2015   Procedure: APPENDECTOMY LAPAROSCOPIC;  Surgeon: Rolm Bookbinder, MD;  Location: [place];  Service: General;  Laterality: N/A;  . LEFT HEART CATH AND CORONARY ANGIOGRAPHY N/A 08/09/2017   Procedure: LEFT HEART CATH AND CORONARY ANGIOGRAPHY;  Surgeon: [place] Hampshire, MD;  Location: [place] CV LAB;  Service: Cardiovascular;  Laterality: N/A;  . LEFT HEART CATH AND CORONARY ANGIOGRAPHY N/A 10/19/2019   Procedure: LEFT HEART CATH AND CORONARY ANGIOGRAPHY;  Surgeon: Minna Merritts, MD;  Location: [place] CV LAB;  Service: Cardiovascular;  Laterality: N/A;  . RADIAL  ARTERY HARVEST Left 10/26/2019   Procedure: RADIAL ARTERY HARVEST;  Surgeon: Wonda Olds, MD;  Location: [place];  Service: Open Heart Surgery;  Laterality: Left;  . TEE WITHOUT CARDIOVERSION N/A 10/26/2019   Procedure: TRANSESOPHAGEAL ECHOCARDIOGRAM (TEE);  Surgeon: Wonda Olds, MD;  Location: [place];  Service: Open Heart Surgery;  Laterality: N/A;     Prior to Admission medications   Medication  Sig Start Date End Date Taking? Authorizing Provider  acetaminophen (TYLENOL) 500 MG tablet Take 1,000 mg by mouth daily as needed.    [provider]  amiodarone (PACERONE) 200 MG tablet Take 1 tablet (200 mg total) by mouth daily. 11/14/19   Loel Dubonnet, NP  apixaban (ELIQUIS) 5 MG TABS tablet Take 1 tablet (5 mg total) by mouth 2 (two) times daily. 11/06/19   Barrett, Lodema Hong, PA-C  aspirin 81 MG chewable tablet Chew 1 tablet (81 mg total) by mouth daily. 10/20/19   Ezekiel Slocumb, DO  atorvastatin (LIPITOR) 80 MG tablet Take 1 tablet (80 mg total) by mouth daily. 10/21/19   Minna Merritts, MD  ezetimibe (ZETIA) 10 MG tablet Take 1 tablet (10 mg total) by mouth daily. 10/20/19   Minna Merritts, MD  famotidine (PEPCID) 20 MG tablet Take 1 tablet (20 mg total) by mouth 2 (two) times daily. Patient taking differently: Take 20 mg by mouth daily as needed.  12/26/17   Paulette Blanch, MD  furosemide (LASIX) 40 MG tablet Take 1 tablet (40 mg total) by mouth 2 (two) times daily for 5 days. 11/20/2019 11/28/19  Carrie Mew, MD  insulin aspart (NOVOLOG) 100 UNIT/ML injection Inject 0-15 Units into the skin 4 (four) times daily - after meals and at bedtime. Patient taking differently: Inject 0-15 Units into the skin 4 (four) times daily - after meals and at bedtime. Sliding scale 10/20/19   Nicole Kindred A, DO  insulin aspart (NOVOLOG) 100 UNIT/ML injection Inject 10 Units into the skin 3 (three) times daily with meals. 10/20/19   Nicole Kindred A, DO  insulin glargine (LANTUS) 100 UNIT/ML injection Inject 0.05 mLs (5 Units total) into the skin daily. 10/20/19   Nicole Kindred A, DO  isosorbide dinitrate (ISORDIL) 20 MG tablet Take 1 tablet (20 mg total) by mouth 3 (three) times daily. For 1 month, then discontinue 11/06/19   Barrett, Erin R, PA-C  levETIRAcetam (KEPPRA) 500 MG tablet Take 500 mg by mouth 3 (three) times daily. 10/03/19   [provider]  metFORMIN (GLUCOPHAGE) 500 MG  tablet Take 500 mg by mouth 2 (two) times daily with a meal.    [provider]  Metoprolol Tartrate 37.5 MG TABS Take 37.5 mg by mouth 2 (two) times daily. 11/06/19   Barrett, Erin R, PA-C  nitroGLYCERIN (NITROSTAT) 0.4 MG SL tablet Place 0.4 mg under the tongue every 5 (five) minutes as needed for chest pain.    [provider]  oxyCODONE (OXY IR/ROXICODONE) 5 MG immediate release tablet Take 1-2 tablets (5-10 mg total) by mouth every 4 (four) hours as needed for severe pain. 11/06/19   Barrett, Erin R, PA-C  pantoprazole (PROTONIX) 40 MG tablet Take 1 tablet (40 mg total) by mouth daily. 10/20/19   Ezekiel Slocumb, DO  potassium chloride SA (KLOR-CON) 20 MEQ tablet Take 1 tablet (20 mEq total) by mouth daily. For 7 days 11/06/19   Barrett, Lodema Hong, PA-C  pregabalin (LYRICA) 50 MG capsule Take 1 capsule (50 mg total) by  mouth 3 (three) times daily. 10/20/19 10/19/20  Nicole Kindred A, DO  venlafaxine (EFFEXOR) 75 MG tablet Take 75 mg by mouth 3 (three) times daily. 05/11/19   [provider]     Allergies Gabapentin and Ace inhibitors   Family History  Problem Relation Age of Onset  . Heart attack Mother 3  . Stroke Mother   . Heart attack Father   . Alzheimer's disease Father     Social History Social History   Tobacco Use  . Smoking status: Former Smoker    Types: Cigarettes    Quit date: 2009    Years since quitting: 12.3  . Smokeless tobacco: Never Used  Substance Use Topics  . Alcohol use: No    Alcohol/week: 0.0 standard drinks  . Drug use: No    Review of Systems  Constitutional:   No fever or chills.  ENT:   No sore throat. No rhinorrhea. Cardiovascular:   No chest pain or syncope. Respiratory:   No dyspnea or cough. Gastrointestinal:   Negative for abdominal pain, vomiting and diarrhea.  Musculoskeletal:   Negative for focal pain or swelling All other systems reviewed and are negative except as documented above in ROS and  HPI.  ____________________________________________   PHYSICAL EXAM:  VITAL SIGNS: ED Triage Vitals  Enc Vitals Group     BP 11/16/2019 1151 133/76     Pulse Rate 12/10/2019 1151 87     Resp 11/28/2019 1151 20     Temp 12/01/2019 1151 (!) 97.4 F (36.3 C)     Temp Source 12/10/2019 1151 Oral     SpO2 11/26/2019 1151 100 %     Weight 11/17/2019 1152 198 lb (89.8 kg)     Height 12/07/2019 1152 5' 7"  (1.702 m)     Head Circumference --      [place] --      Pain Score 12/08/2019 1152 0     Pain Loc --      Pain Edu? --      Excl. in [place]? --     Vital signs reviewed, nursing assessments reviewed.   Constitutional:   Alert and oriented. Non-toxic appearance. Eyes:   Conjunctivae are normal. EOMI. PERRL. ENT      Head:   Normocephalic with left forehead contusion.      Nose:   Wearing a mask.      Mouth/Throat:   Wearing a mask.      Neck:   No meningismus. Full ROM.  No midline spinal tenderness. Hematological/Lymphatic/Immunilogical:   No cervical lymphadenopathy. Cardiovascular:   RRR. Symmetric bilateral radial and DP pulses.  No murmurs. Cap refill less than 2 seconds. Respiratory:   Normal respiratory effort without tachypnea/retractions.  Left lower lung crackles Gastrointestinal:   Soft and nontender. Non distended. There is no CVA tenderness.  No rebound, rigidity, or guarding.  Musculoskeletal:   Normal range of motion in all extremities. No joint effusions.  No lower extremity tenderness.  No edema. Neurologic:   Normal speech and language.  Motor grossly intact. No acute focal neurologic deficits are appreciated.  Skin:    Skin is warm, dry and intact. No rash noted.  No petechiae, purpura, or bullae.  ____________________________________________    LABS (pertinent positives/negatives) (all labs ordered are listed, but only abnormal results are displayed) Labs Reviewed  COMPREHENSIVE METABOLIC PANEL - Abnormal; Notable for the following components:      Result Value    Glucose, Bld 180 (*)  BUN 34 (*)    Creatinine, Ser 1.72 (*)    Albumin 3.1 (*)    GFR calc non Af Amer 42 (*)    GFR calc Af Amer 49 (*)    All other components within normal limits  CBC WITH DIFFERENTIAL/PLATELET - Abnormal; Notable for the following components:   RBC 3.30 (*)    Hemoglobin 9.9 (*)    HCT 32.0 (*)    RDW 19.5 (*)    Lymphs Abs 0.6 (*)    All other components within normal limits  PROTIME-INR - Abnormal; Notable for the following components:   Prothrombin Time 20.8 (*)    INR 1.9 (*)    All other components within normal limits  SARS CORONAVIRUS 2 BY RT PCR (HOSPITAL ORDER, [facility])  TROPONIN I (HIGH SENSITIVITY)  TROPONIN I (HIGH SENSITIVITY)   ____________________________________________   EKG    ____________________________________________    RADIOLOGY  DG Chest 2 View  Result Date: 11/13/2019 CLINICAL DATA:  Shortness of breath, fall EXAM: CHEST - 2 VIEW COMPARISON:  11/13/2019 FINDINGS: Prior CABG. Stable cardiomegaly. Diffusely increased interstitial markings throughout both lungs with patchy scattered patchy opacities in the bilateral lung bases and peripheral aspect of the right upper lobe. No large pleural fluid collection. No pneumothorax. Degenerative changes of the thoracic spine suggesting DISH. IMPRESSION: Findings suggestive of congestive heart failure with pulmonary edema. A superimposed infectious process would be difficult to exclude. Electronically Signed   By: Davina Poke D.O.   On: 11/22/2019 12:37   CT HEAD WO CONTRAST  Result Date: 12/05/2019 CLINICAL DATA:  Fall. EXAM: CT HEAD WITHOUT CONTRAST TECHNIQUE: Contiguous axial images were obtained from the base of the skull through the vertex without intravenous contrast. COMPARISON:  MR brain and CT head dated April 01, 2019. FINDINGS: Brain: No evidence of acute infarction, hemorrhage, hydrocephalus, extra-axial collection or mass lesion/mass  effect. Stable mild atrophy and chronic microvascular ischemic changes. Small remote lacunar infarct in the right cerebellum again noted. Vascular: Calcified atherosclerosis at the skullbase. No hyperdense vessel. Skull: Normal. Negative for fracture or focal lesion. Sinuses/Orbits: No acute finding. Other: None. IMPRESSION: No acute intracranial abnormality. Electronically Signed   By: Titus Dubin M.D.   On: 11/22/2019 12:47    ____________________________________________   PROCEDURES Procedures  ____________________________________________  DIFFERENTIAL DIAGNOSIS   Intracranial hemorrhage, Non-STEMI, CHF, pleural effusion, pneumonia  CLINICAL IMPRESSION / ASSESSMENT AND PLAN / ED COURSE  Medications ordered in the ED: Medications  furosemide (LASIX) injection 40 mg (has no administration in time range)    Pertinent labs & imaging results that were available during my care of the patient were reviewed by me and considered in my medical decision making (see chart for details).  Heith Jaymeson Mengel was evaluated in Emergency Department on 12/04/2019 for the symptoms described in the history of present illness. He was evaluated in the context of the global COVID-19 pandemic, which necessitated consideration that the patient might be at risk for infection with the SARS-CoV-2 virus that causes COVID-19. Institutional protocols and algorithms that pertain to the evaluation of patients at risk for COVID-19 are in a state of rapid change based on information released by regulatory bodies including the CDC and federal and state organizations. These policies and algorithms were followed during the patient's care in the ED.   Patient presents after fall.  History is unclear whether this is mechanical or syncopal.  Will check labs and Covid test.  Vital signs are  normal, not in distress.  Requiring slightly increased oxygen of 4 L compared to his usual baseline of 3 L.  Clinical Course as of Nov 23 1439  Thu Nov 23, 2019  1220 Baseline chronic anemia  Hemoglobin(!): 9.9 [PS]  1660 D/w cardiology Dr. Rockey Situ who recs trial of IV lasix. If he has good UOP and feels better, could DC home with doubling of diuretic dosing to f/u in CHF clinic.   [PS]    Clinical Course User Index [PS] Carrie Mew, MD     ----------------------------------------- 3:01 PM on 12/05/2019 -----------------------------------------  CT head unremarkable.  Chest x-ray consistent with pulmonary edema.  Labs are unremarkable, stable anemia and CKD.  Serial troponins negative.  plan to give IV Lasix in the ED, observe urine output, if still feeling well or improved, stable for discharge home on increased Lasix to follow-up with cardiology.  ____________________________________________   FINAL CLINICAL IMPRESSION(S) / ED DIAGNOSES    Final diagnoses:  Acute on chronic congestive heart failure, unspecified heart failure type ([place])  Hx of CABG     ED Discharge Orders         Ordered    furosemide (LASIX) 40 MG tablet  2 times daily     12/11/2019 1441          Portions of this note were generated with dragon dictation software. Dictation errors may occur despite best attempts at proofreading.   Carrie Mew, MD 12/05/2019 [phone]

## 2019-11-23 NOTE — ED Notes (Signed)
RT at bedside for ABG

## 2019-11-23 NOTE — ED Notes (Signed)
Pt placed on 4L Real taken of bipap per dr danford at bedside. SpO2 94%

## 2019-11-23 NOTE — ED Notes (Signed)
Attempted to call pt's brother and daughter with no success to inform them of pt's arrival. Will retry later.

## 2019-11-23 NOTE — ED Notes (Signed)
Pt placed on external catheter

## 2019-11-23 NOTE — ED Provider Notes (Signed)
-----------------------------------------   5:16 PM on 11/15/2019 -----------------------------------------  I took over care on this patient from Dr. Joni Fears.  The plan was to give a dose of Lasix, reassess if the patient could be weaned back to his baseline 3 L of O2 by nasal cannula, and possible discharge home.  However, when the nurse was giving the patient Lasix she noted him to be altered and hypoxic.  O2 saturation was in the low 80s at that point on 4 L.  We brought him into the room, improved his positioning, put him to 6 L, and his mental status started to improve.  Given the CHF and edema I placed him on BiPAP and the Lasix was given.  His mental status has now improved further.  However, given the O2 requirement and acute on chronic CHF he will need admission.  I discussed his case with the hospitalist Dr. Loleta Books.   Arta Silence, MD 11/22/2019 1718

## 2019-11-23 NOTE — H&P (Signed)
History and Physical  Patient Name: Larry Terrell     QBH:[id number]    DOB: 08-02-1960    DOA: 11/15/2019 PCP: Medicine, [place]  Patient coming from: SNF  Chief Complaint: Fall, weakness      HPI: Larry Terrell is a 59 y.o. M with hx CAD s/p PCI x2 2015 and CABG x5 last month at [facility], postop A. fib on amiodarone and Eliquis, COPD on 2 L home O2, depression, chronic pain, DM, HTN, CHF EF 45%, obesity, and PVD status post aortofemoral bypass, CKD 3B baseline creatinine 1.7, and NASH who presents with fall.  Caveat the patient is now confused, poorly able to provide history.  Per report, patient was at SNF today when he fell, hit his head, was brought to the ER.  In the ER, the patient was initially hypoxic requiring 3-4L oxygen (baseline at 2.  CT head unremarkable.  Chest x-ray showed edema.  Electrolytes and blood count and renal function stable relative to baseline, hemodynamically stable.  While in the ER, the patient became progressively more encephalopathic and hypoxic to 70% on 4 L,, and confused.  Was placed on BiPAP, Lasix was given, and the hospital service were asked to evaluate for respiratory failure and confusion.      ROS: Review of Systems  Constitutional: Positive for malaise/fatigue. Negative for chills and fever.  Eyes: Negative for blurred vision, double vision and photophobia.  Respiratory: Negative for cough, sputum production, shortness of breath and wheezing.   Cardiovascular: Negative for chest pain.  Gastrointestinal: Negative for abdominal pain, nausea and vomiting.  Neurological: Positive for weakness. Negative for dizziness, sensory change, speech change, focal weakness, seizures, loss of consciousness and headaches.  All other systems reviewed and are negative. Review of systems somewhat limited by confusion.      Past Medical History:  Diagnosis Date  . CHF (congestive heart failure) (HCC)    diastolic (echo at [facility]) EF 55-60%  .  Cirrhosis ([place])    NASH per records  . COPD (chronic obstructive pulmonary disease) ([place])   . Depression   . Diabetes mellitus with neuropathy ([place])   . Heart attack ([place])   . Neuropathy   . OSA (obstructive sleep apnea)   . Peripheral vascular disease due to secondary diabetes mellitus ([place])   . Splenomegaly   . Temporal giant cell arteritis ([place])   . TIA (transient ischemic attack) April 2016    Past Surgical History:  Procedure Laterality Date  . AORTA - FEMORAL ARTERY BYPASS GRAFT Bilateral   . CARDIAC CATHETERIZATION    . cardiac stents  July 2015   . CORONARY ARTERY BYPASS GRAFT N/A 10/26/2019   Procedure: CORONARY ARTERY BYPASS GRAFTING (CABG) x 5, Using Bilateral mammary arteries, and right leg greater saphenous vein harvested endoscopically;  Surgeon: Wonda Olds, MD;  Location: [place];  Service: Open Heart Surgery;  Laterality: N/A;  . CORONARY STENT INTERVENTION N/A 08/09/2017   Procedure: CORONARY STENT INTERVENTION;  Surgeon: Wellington Hampshire, MD;  Location: [place] [facility];  Service: Cardiovascular;  Laterality: N/A;  . LAPAROSCOPIC APPENDECTOMY N/A 07/10/2015   Procedure: APPENDECTOMY LAPAROSCOPIC;  Surgeon: Rolm Bookbinder, MD;  Location: [place];  Service: General;  Laterality: N/A;  . LEFT HEART CATH AND CORONARY ANGIOGRAPHY N/A 08/09/2017   Procedure: LEFT HEART CATH AND CORONARY ANGIOGRAPHY;  Surgeon: Wellington Hampshire, MD;  Location: [place] [facility];  Service: Cardiovascular;  Laterality: N/A;  . LEFT HEART CATH AND CORONARY  ANGIOGRAPHY N/A 10/19/2019   Procedure: LEFT HEART CATH AND CORONARY ANGIOGRAPHY;  Surgeon: Minna Merritts, MD;  Location: [place] [facility];  Service: Cardiovascular;  Laterality: N/A;  . RADIAL ARTERY HARVEST Left 10/26/2019   Procedure: RADIAL ARTERY HARVEST;  Surgeon: Wonda Olds, MD;  Location: [place];  Service: Open Heart Surgery;  Laterality: Left;  . TEE WITHOUT CARDIOVERSION N/A 10/26/2019   Procedure:  TRANSESOPHAGEAL ECHOCARDIOGRAM (TEE);  Surgeon: Wonda Olds, MD;  Location: [place];  Service: Open Heart Surgery;  Laterality: N/A;    Social History: Patient is currently in rehabilitation At a skilled nursing facility.former smoker.  Allergies  Allergen Reactions  . Gabapentin   . Ace Inhibitors Cough    Other reaction(s): Other (See Comments) cough    Family history: family history includes Alzheimer's disease in his father; Heart attack in his father; Heart attack (age of onset: 38) in his mother; Stroke in his mother.  Prior to Admission medications   Medication Sig Start Date End Date Taking? Authorizing Provider  acetaminophen (TYLENOL) 500 MG tablet Take 500-1,000 mg by mouth daily as needed for mild pain or fever.    Yes [provider]  amiodarone (PACERONE) 200 MG tablet Take 1 tablet (200 mg total) by mouth daily. 11/14/19  Yes Loel Dubonnet, NP  apixaban (ELIQUIS) 5 MG TABS tablet Take 1 tablet (5 mg total) by mouth 2 (two) times daily. 11/06/19  Yes Barrett, Erin R, PA-C  aspirin 81 MG chewable tablet Chew 1 tablet (81 mg total) by mouth daily. 10/20/19  Yes Nicole Kindred A, DO  atorvastatin (LIPITOR) 80 MG tablet Take 1 tablet (80 mg total) by mouth daily. Patient taking differently: Take 80 mg by mouth every evening.  10/21/19  Yes Minna Merritts, MD  ezetimibe (ZETIA) 10 MG tablet Take 1 tablet (10 mg total) by mouth daily. 10/20/19  Yes Minna Merritts, MD  famotidine (PEPCID) 20 MG tablet Take 1 tablet (20 mg total) by mouth 2 (two) times daily. 12/26/17  Yes Paulette Blanch, MD  insulin aspart (NOVOLOG) 100 UNIT/ML injection Inject 0-15 Units into the skin 4 (four) times daily - after meals and at bedtime. Patient taking differently: Inject 0-20 Units into the skin 4 (four) times daily - after meals and at bedtime. Inject according to blood sugar readings:  < 60u: 0u and contact MD 120-160: 2u 161-200: 4u 201-250: 8u 251-300: 12u 301-350:  16u 351-450: contact MD 10/20/19  Yes Nicole Kindred A, DO  insulin aspart (NOVOLOG) 100 UNIT/ML injection Inject 10 Units into the skin 3 (three) times daily with meals. 10/20/19  Yes Nicole Kindred A, DO  insulin glargine (LANTUS) 100 UNIT/ML injection Inject 0.05 mLs (5 Units total) into the skin daily. 10/20/19  Yes Nicole Kindred A, DO  isosorbide dinitrate (ISORDIL) 20 MG tablet Take 1 tablet (20 mg total) by mouth 3 (three) times daily. For 1 month, then discontinue Patient taking differently: Take 20 mg by mouth 3 (three) times daily.  11/06/19  Yes Barrett, Erin R, PA-C  levETIRAcetam (KEPPRA) 500 MG tablet Take 500 mg by mouth 3 (three) times daily. 10/03/19  Yes [provider]  metFORMIN (GLUCOPHAGE) 500 MG tablet Take 500 mg by mouth 2 (two) times daily with a meal.   Yes [provider]  Metoprolol Tartrate 37.5 MG TABS Take 37.5 mg by mouth 2 (two) times daily. 11/06/19  Yes Barrett, Erin R, PA-C  nitroGLYCERIN (NITROSTAT) 0.4 MG SL tablet Place  0.4 mg under the tongue every 5 (five) minutes as needed for chest pain.   Yes [provider]  oxyCODONE (OXY IR/ROXICODONE) 5 MG immediate release tablet Take 1-2 tablets (5-10 mg total) by mouth every 4 (four) hours as needed for severe pain. 11/06/19  Yes Barrett, Erin R, PA-C  pantoprazole (PROTONIX) 40 MG tablet Take 1 tablet (40 mg total) by mouth daily. 10/20/19  Yes Nicole Kindred A, DO  pregabalin (LYRICA) 50 MG capsule Take 1 capsule (50 mg total) by mouth 3 (three) times daily. 10/20/19 10/19/20 Yes Ezekiel Slocumb, DO  therapeutic multivitamin-minerals [facility]) tablet Take 1 tablet by mouth daily.   Yes [provider]  venlafaxine (EFFEXOR) 75 MG tablet Take 75 mg by mouth 3 (three) times daily. 05/11/19  Yes [provider]  furosemide (LASIX) 40 MG tablet Take 1 tablet (40 mg total) by mouth 2 (two) times daily for 5 days. 11/15/2019 11/28/19  Carrie Mew, MD       Physical  Exam: BP 136/70   Pulse 62   Temp (!) 97.4 F (36.3 C) (Oral)   Resp 15   Ht 5' 7"  (1.702 m)   Wt 89.8 kg   SpO2 92%   BMI 31.01 kg/m  General appearance: Chronically ill-appearing adult male, awake but somewhat disoriented, inattentive.   Eyes: Anicteric, conjunctiva pink, lids and lashes normal.  Right pupil 4 mm, reactive to [place].  Left pupil has.   surgical scar from eye surgery  ENT: No nasal deformity, discharge, epistaxis.  Hearing seems normal. OP dry without lesions.  Mostly edentulous. Neck: No neck masses.  Trachea midline.  No thyromegaly/tenderness. Lymph: No cervical or supraclavicular lymphadenopathy. Skin: Pallor noted. No tmottling.  Cool and dry.  No jaundice.  No suspicious rashes or lesions. Cardiac: RRR, nl S1-S2, no murmurs appreciated.  Capillary refill is brisk.  JVP normale.  No LE edema.  Radial pulses 2+ and symmetric.DP pulses diminished but present. Respiratory: Normal respiratory rate and rhythm.  Lung sounds diminished I do not appreciate rales or wheezes. Abdomen: Abdomen soft.  No TTP or guarding. No ascites, distension, hepatosplenomegaly.   MSK: No deformities or effusions of the large joints of the upper or lower extremities bilaterally.  No cyanosis or clubbing.  Diffuse loss of subcutaneous muscle mass and fat. Neuro: Extraocular movements are intact but slow, without nystagmus.  Cranial nerve 7 is symmetrical.  Cranial nerve 8 is within normal limits.  Cranial nerves 9 and 10 reveal equal palate elevation.  Cranial nerve 11 reveals sternocleidomastoid strong.  Cranial nerve 12 is midline.  Motor strength testing is 4/5 in the upper and lower extremities bilaterally with some mild ataxia which is symmetric.  Sensory examination is intact to [place] touch.  Finger-to-nose testing is limited by encephalopathy. The patient is oriented to self, able to be oriented to hospital, but seems otherwise disoriented.  Speech is fluent.  Psychomotor slowing is obvious,  and is short-term recall seems impaired.  He is globally encephalopathic.  Attention span seems diminished.   Psych: Makes eye contact and attempts to follow commands and answer questions, but psychomotor slowing is noted, attention is diminished, affect is blunted, judgment insight appear moderately impaired      Labs on Admission:  I have personally reviewed following labs and imaging studies: CBC: Recent Labs  Lab 11/27/2019 1208  WBC 8.8  NEUTROABS 7.3  HGB 9.9*  HCT 32.0*  MCV 97.0  PLT 952   Basic Metabolic Panel: Recent Labs  Lab 12/11/2019 1208  NA 139  K 4.5  CL 103  CO2 25  GLUCOSE 180*  BUN 34*  CREATININE 1.72*  CALCIUM 8.9   GFR: Estimated Creatinine Clearance: 48.2 mL/min (A) (by C-G formula based on SCr of 1.72 mg/dL (H)).  Liver Function Tests: Recent Labs  Lab 11/22/2019 1208  AST 19  ALT 13  ALKPHOS 100  BILITOT 1.0  PROT 6.9  ALBUMIN 3.1*   No results for input(s): LIPASE, AMYLASE in the last 168 hours. No results for input(s): AMMONIA in the last 168 hours. Coagulation Profile: Recent Labs  Lab 11/13/2019 1208  INR 1.9*     Recent Results (from the past 240 hour(s))  SARS Coronavirus 2 by RT PCR (hospital order, performed in [facility] hospital lab) Nasopharyngeal Nasopharyngeal Swab     Status: None   Collection Time: 11/24/2019  2:05 PM   Specimen: Nasopharyngeal Swab  Result Value Ref Range Status   SARS Coronavirus 2 NEGATIVE NEGATIVE Final    Comment: (NOTE) SARS-CoV-2 target nucleic acids are NOT DETECTED. The SARS-CoV-2 RNA is generally detectable in upper and lower respiratory specimens during the acute phase of infection. The lowest concentration of SARS-CoV-2 viral copies this assay can detect is 250 copies / mL. A negative result does not preclude SARS-CoV-2 infection and should not be used as the sole basis for treatment or other patient management decisions.  A negative result may occur with improper specimen collection /  handling, submission of specimen other than nasopharyngeal swab, presence of viral mutation(s) within the areas targeted by this assay, and inadequate number of viral copies (<250 copies / mL). A negative result must be combined with clinical observations, patient history, and epidemiological information. Fact Sheet for Patients:   StrictlyIdeas.no Fact Sheet for Healthcare Providers: BankingDealers.co.za This test is not yet approved or cleared  by the [place] FDA and has been authorized for detection and/or diagnosis of SARS-CoV-2 by FDA under an Emergency Use Authorization (EUA).  This EUA will remain in effect (meaning this test can be used) for the duration of the COVID-19 declaration under Section 564(b)(1) of the Act, 21 U.S.C. section 360bbb-3(b)(1), unless the authorization is terminated or revoked sooner. Performed at [facility], [address] [place], [place] [address]            Radiological Exams on Admission: Personally reviewed chest x-ray shows bilateral infiltrates: DG Chest 2 View  Result Date: 12/07/2019 CLINICAL DATA:  Shortness of breath, fall EXAM: CHEST - 2 VIEW COMPARISON:  11/13/2019 FINDINGS: Prior CABG. Stable cardiomegaly. Diffusely increased interstitial markings throughout both lungs with patchy scattered patchy opacities in the bilateral lung bases and peripheral aspect of the right upper lobe. No large pleural fluid collection. No pneumothorax. Degenerative changes of the thoracic spine suggesting DISH. IMPRESSION: Findings suggestive of congestive heart failure with pulmonary edema. A superimposed infectious process would be difficult to exclude. Electronically Signed   By: Davina Poke D.O.   On: 11/13/2019 12:37   CT HEAD WO CONTRAST  Result Date: 11/13/2019 CLINICAL DATA:  Fall. EXAM: CT HEAD WITHOUT CONTRAST TECHNIQUE: Contiguous axial images were obtained from the base of the  skull through the vertex without intravenous contrast. COMPARISON:  MR brain and CT head dated April 01, 2019. FINDINGS: Brain: No evidence of acute infarction, hemorrhage, hydrocephalus, extra-axial collection or mass lesion/mass effect. Stable mild atrophy and chronic microvascular ischemic changes. Small remote lacunar infarct in the right cerebellum again noted. Vascular: Calcified atherosclerosis at the  skullbase. No hyperdense vessel. Skull: Normal. Negative for fracture or focal lesion. Sinuses/Orbits: No acute finding. Other: None. IMPRESSION: No acute intracranial abnormality. Electronically Signed   By: Titus Dubin M.D.   On: 11/19/2019 12:47    EKG: Independently reviewed.  Atrial fibrillation, no ST changes, rate 87.       Assessment/Plan   Acute on chronic hypoxic respiratory failure Respiratory failure due to acute on chronic systolic and diastolic CHF Presented with fall, found to have increased O2 needs, edema on CXR.    Echo during hospitalization for CABG last month showed EF 45%.  Not on ARB due to renal dysfunction. -Check BNP -Furosemide 40 mg IV twice a day  -Strict I/Os, daily weights, telemetry  -Daily monitoring renal function -Continue metoprolol, aspirin, atorvastatin, Zetia, Isordil     Acute metabolic encephalopathy Unclear etiology, this appears to have developed relatively acutely after arrival to the ER.  CT head rules out intracranial bleeding.  No focal deficits to suggest stroke.  Rather has a global metabolic encephalopathy, suspect this is from hypoxia with superimposed infection.  ABG ruled out hypercarbia.  Will rule out hyperammonemia, infection.  No witnessed seizure, doubt post-ictal state -Check blood cultures -Check procalcitonin -Start empiric antibiotics, with ceftriaxone and azithromycin de-escalate as able -Check ammonia, TSH -Check EEG  Coronary disease, secondary prevention Peripheral vascular disease, secondary  prevention Hypertension Recent CABG -Continue aspirin, atorvastatin, Zetia, Isordil famotidine, pantoprazole  Diabetes with macrovascular complications -Continue home Lantus -SS correction insulin  COPD No wheezing  Depression Chronic pain -Continue venlafaxine, Lyrica, oxycodone  CKD stage IIIb Creatinine stable relative to recent baseline  Atrial fibrillation, paroxysmal This developed post CABG last month.  He is on amiodarone and Eliquis.  CTH has ruled out bleeding. -Continue amiodarone and Eliquis  History of seizures -Continue Keppra         DVT prophylaxis: N/A on Eliquis  Code Status: FULL  Family Communication: Daughter by phone  Disposition Plan: Anticipate IV Lasix, close monitoring in stepdown.  Consults called: None Admission status: INPATIENT   At the time of admission, it appears that the appropriate admission status for this patient is INPATIENT. This is judged to be reasonable and necessary in order to provide the required intensity of service to ensure the patient's safety    Together, these circumstances are felt to place him at high risk for further clinical deterioration threatening life, limb, or organ requiring a high intensity of service due to this severe exacerbation of their chronic organ failure.  I certify that at the point of admission it is my clinical judgment that the patient will require inpatient hospital care spanning beyond 2 midnights from the point of admission and that early discharge would result in unnecessary risk of decompensation and readmission or threat to life, limb or bodily function.   Medical decision making: Patient seen at 6:03 PM on 11/16/2019.  The patient was discussed with Dr. Cherylann Banas.  What exists of the patient's chart was reviewed in depth and summarized above.  Clinical condition: mentation poor, but respiratory status improved to 3-4L O2 and BP and HR normal.        Suann Larry Devinn [facility] Please page though [place] or Epic secure chat:  For password, contact charge nurse

## 2019-11-23 NOTE — Progress Notes (Signed)
Pt taken off BIPAP per MD, tolerating well at this time. Will continue to monitor.

## 2019-11-23 NOTE — ED Notes (Signed)
Report given to floor RN

## 2019-11-23 NOTE — ED Notes (Signed)
Pt noted to be hypoxic SpO2 70's. Siadecki MD to bedside. [place] increased to 6L . Pt readjusted to be sitting up in bed. 89% on 6L. RT called for bipap per siadecki MD

## 2019-11-23 NOTE — ED Notes (Signed)
Pt disoriented to time and place

## 2019-11-23 NOTE — Progress Notes (Signed)
Called Dr Joni Fears to get sign out on this patient.  He had discussed patient with his cardiologist, Dr Candis Musa who recommended trial of IV diuretics and if patient has good urine output to discharge home with an increase in his dose of oral diuretic therapy.  Patient to follow-up with cardiology as an outpatient Admission deferred for now

## 2019-11-23 NOTE — Discharge Instructions (Signed)
Increase your lasix to 19m twice a day, and follow up with the heart failure clinic in the next 4 days for reassessment of your symptoms.

## 2019-11-23 NOTE — ED Notes (Signed)
Pt tolerating bipap well. Resting comfortably. SpO2 98%. Family at bedside. Stretcher locked in lowest position

## 2019-11-24 ENCOUNTER — Inpatient Hospital Stay: Payer: Medicare HMO

## 2019-11-24 ENCOUNTER — Inpatient Hospital Stay (HOSPITAL_COMMUNITY)
Admit: 2019-11-24 | Discharge: 2019-11-24 | Disposition: A | Discharge status: Home / Self Care | Payer: Medicare HMO | Attending: Family Medicine | Admitting: Family Medicine

## 2019-11-24 DIAGNOSIS — I739 Peripheral vascular disease, unspecified: Secondary | ICD-10-CM

## 2019-11-24 DIAGNOSIS — D500 Iron deficiency anemia secondary to blood loss (chronic): Secondary | ICD-10-CM

## 2019-11-24 DIAGNOSIS — I639 Cerebral infarction, unspecified: Secondary | ICD-10-CM

## 2019-11-24 DIAGNOSIS — I340 Nonrheumatic mitral (valve) insufficiency: Secondary | ICD-10-CM

## 2019-11-24 DIAGNOSIS — I5033 Acute on chronic diastolic (congestive) heart failure: Secondary | ICD-10-CM

## 2019-11-24 DIAGNOSIS — J432 Centrilobular emphysema: Secondary | ICD-10-CM

## 2019-11-24 DIAGNOSIS — I2510 Atherosclerotic heart disease of native coronary artery without angina pectoris: Secondary | ICD-10-CM

## 2019-11-24 DIAGNOSIS — R0603 Acute respiratory distress: Secondary | ICD-10-CM

## 2019-11-24 LAB — HEPATIC FUNCTION PANEL
ALT: 12 U/L (ref 0–44)
AST: 16 U/L (ref 15–41)
Albumin: 2.8 g/dL — ABNORMAL LOW (ref 3.5–5.0)
Alkaline Phosphatase: 97 U/L (ref 38–126)
Bilirubin, Direct: 0.1 mg/dL (ref 0.0–0.2)
Indirect Bilirubin: 0.6 mg/dL (ref 0.3–0.9)
Total Bilirubin: 0.7 mg/dL (ref 0.3–1.2)
Total Protein: 6.7 g/dL (ref 6.5–8.1)

## 2019-11-24 LAB — CBC
HCT: 31.1 % — ABNORMAL LOW (ref 39.0–52.0)
Hemoglobin: 9.6 g/dL — ABNORMAL LOW (ref 13.0–17.0)
MCH: 30.2 pg (ref 26.0–34.0)
MCHC: 30.9 g/dL (ref 30.0–36.0)
MCV: 97.8 fL (ref 80.0–100.0)
Platelets: 205 10*3/uL (ref 150–400)
RBC: 3.18 MIL/uL — ABNORMAL LOW (ref 4.22–5.81)
RDW: 19.6 % — ABNORMAL HIGH (ref 11.5–15.5)
WBC: 7.9 10*3/uL (ref 4.0–10.5)
nRBC: 0 % (ref 0.0–0.2)

## 2019-11-24 LAB — BASIC METABOLIC PANEL
Anion gap: 10 (ref 5–15)
BUN: 34 mg/dL — ABNORMAL HIGH (ref 8–23)
CO2: 29 mmol/L (ref 22–32)
Calcium: 8.6 mg/dL — ABNORMAL LOW (ref 8.9–10.3)
Chloride: 101 mmol/L (ref 98–111)
Creatinine, Ser: 1.6 mg/dL — ABNORMAL HIGH (ref 0.61–1.24)
GFR calc Af Amer: 53 mL/min — ABNORMAL LOW (ref >60)
GFR calc non Af Amer: 46 mL/min — ABNORMAL LOW (ref >60)
Glucose, Bld: 259 mg/dL — ABNORMAL HIGH (ref 70–99)
Potassium: 3.7 mmol/L (ref 3.5–5.1)
Sodium: 140 mmol/L (ref 135–145)

## 2019-11-24 LAB — ECHOCARDIOGRAM COMPLETE
Height: 67 in
Weight: 3061.75 oz

## 2019-11-24 LAB — GLUCOSE, CAPILLARY
Glucose-Capillary: 146 mg/dL — ABNORMAL HIGH (ref 70–99)
Glucose-Capillary: 225 mg/dL — ABNORMAL HIGH (ref 70–99)
Glucose-Capillary: 128 mg/dL — ABNORMAL HIGH (ref 70–99)
Glucose-Capillary: 157 mg/dL — ABNORMAL HIGH (ref 70–99)

## 2019-11-24 LAB — VITAMIN B12: Vitamin B-12: 444 pg/mL (ref 180–914)

## 2019-11-24 LAB — HEMOGLOBIN A1C
Hgb A1c MFr Bld: 5.3 % (ref 4.8–5.6)
Mean Plasma Glucose: 105.41 mg/dL

## 2019-11-24 LAB — LACTIC ACID, PLASMA: Lactic Acid, Venous: 1.8 mmol/L (ref 0.5–1.9)

## 2019-11-24 MED ORDER — POTASSIUM CHLORIDE CRYS ER 20 MEQ PO TBCR
30.0000 meq | EXTENDED_RELEASE_TABLET | Freq: Once | ORAL | Status: AC
  Administered 2019-11-24: 30 meq via ORAL
  Filled 2019-11-24: qty 1

## 2019-11-24 MED ORDER — FUROSEMIDE 10 MG/ML IJ SOLN
80.0000 mg | Freq: Two times a day (BID) | INTRAMUSCULAR | Status: DC
  Administered 2019-11-24 – 2019-11-26 (×4): 80 mg via INTRAVENOUS
  Filled 2019-11-24 (×4): qty 8

## 2019-11-24 MED ORDER — STROKE: EARLY STAGES OF RECOVERY BOOK: Freq: Once | Status: DC

## 2019-11-24 NOTE — Progress Notes (Signed)
PROGRESS NOTE    Josias Tomerlin  EUM:[id number] DOB: 25-Aug-1960 DOA: 12/09/2019 PCP: Medicine, [employer]      Brief Narrative:  Mr. Guttman is a 59 y.o. M with hx CAD s/p PCI x2 2015 and CABG x5 last month at [facility], postop A. fib on amiodarone and Eliquis, COPD on 2 L home O2, depression, chronic pain, DM, HTN, CHF EF 45%, obesity, and PVD status post aortofemoral bypass, CKD 3B baseline creatinine 1.7, and NASH who presents with fall.  Caveat the patient is now confused, poorly able to provide history.  Per report, patient was at SNF today when he fell, hit his head, was brought to the ER.  In the ER, the patient was initially hypoxic requiring 3-4L oxygen (baseline at 2.  CT head unremarkable.  Chest x-ray showed edema.  Electrolytes and blood count and renal function stable relative to baseline, hemodynamically stable.  While in the ER, the patient became progressively more encephalopathic and hypoxic to 70% on 4 L,, and confused.  Was placed on BiPAP, Lasix was given, and the hospital service were asked to evaluate for respiratory failure and confusion.       Assessment & Plan:  Acute on chronic hypoxic respiratory failure Respiratory failure due to acute on chronic systolic and diastolic CHF Presented with fall, found to have increased O2 needs, edema on CXR.  BNP 200s.  Echo during hospitalization for CABG last month showed EF 45%.  Not on ARB due to renal dysfunction.    Lasix last night, only ~500cc documented net negative.  Cr better.  O2 needs still higher than basleine. -Continue Furosemide 40 mg IV twice a day  -Strict I/Os, daily weights, telemetry  -Daily monitoring renal function -Continue metoprolol, aspirin, atorvastatin, Zetia, Isordil     Acute metabolic encephalopathy Present at time of admission, unclear cause.    CT head ruled out intracranial bleeding.  No focal deficits to suggest stroke.  Ammonia low.  ABG ruled out hypercarbia.  Suspect  this is from hypoxia with superimposed infection. Procal low, will stop antibiotics, sepsis ruled out.  Mentation seems back to baseline today. cardiology note a left-sided facial droop which I do not appreciate today -Follow EEG -MRI brain ordered  Coronary disease, secondary prevention Peripheral vascular disease, secondary prevention Hypertension Recent CABG -Continue aspirin, atorvastatin, Zetia, Isordil famotidine, pantoprazole  Diabetes with macrovascular complications Glucoses well controlled -Continue home Lantus -Continue SS correction insulin  COPD No wheezing  Depression Chronic pain -Continue venlafaxine, Lyrica, oxycodone  CKD stage IIIb Creatinine stable today, relative to recent baseline  Atrial fibrillation, paroxysmal This developed post CABG last month.  He is on amiodarone and Eliquis.  CTH has ruled out bleeding. HR normal -Continue amiodarone and Eliquis  History of seizures No seizures -Continue Keppra         Disposition: Status is: Inpatient  Remains inpatient appropriate because:He remains fluid overloaded on increased level oxygen,, and with rales on exam.  He will need ongoing IV Lasix.   Dispo: The patient is from: SNF              Anticipated d/c is to: SNF              Anticipated d/c date is: 2 days              Patient currently is not medically stable to d/c.               MDM: The below labs and  imaging reports were reviewed and summarized above.  Medication management as above.  This is a severe exacerbation of his chronic disease  DVT prophylaxis: N/A on systemic anticoagulation Code Status: FULL Family Communication: Sister by phone, code status confirmed    Consultants:     Procedures:     Antimicrobials:   CTX and azithromycin x1 on 5/13   Culture data:   5/13 blood culture x2 -- ngtd           Subjective: Patient has no chest pain, headache, focal weakness, focal  numbness.  He is somewhat disoriented, but appears to be interactive, coherent relative to his baseline.  He is somewhat dyspneic.     Objective: Vitals:   11/24/19 0100 11/24/19 0440 11/24/19 0500 11/24/19 0753  BP:  140/71  (!) 163/76  Pulse:  80  70  Resp: 16 16  19   Temp:  97.7 F (36.5 C)  97.6 F (36.4 C)  TempSrc:    Oral  SpO2: 93% 93%  97%  Weight:   86.8 kg   Height:        Intake/Output Summary (Last 24 hours) at 11/24/2019 0034 Last data filed at 11/24/2019 9179 Gross per 24 hour  Intake 350 ml  Output 1500 ml  Net -1150 ml   Filed Weights   12/05/2019 1152 11/24/19 0500  Weight: 89.8 kg 86.8 kg    Examination: General appearance: Chronically ill-appearing adult male, alert and in no acute distress.  Eating himself breakfast HEENT: Anicteric, conjunctiva pink, lids and lashes normal.  Slight ptosis on the left, left iris defect from old surgery.  No nasal deformity, discharge, epistaxis.  Lips moist, mostly edentulous, oropharynx moist, no oral lesions, hearing normal.   Skin: Warm and dry.  Mildly pale, no jaundice.  No suspicious rashes or lesions. Cardiac: RRR, nl S1-S2, no murmurs appreciated.  Capillary refill is brisk.  JVP not visible.  No LE edema.  Radial pulses 2+ and symmetric. Respiratory: Normal respiratory rate and rhythm.  Crackles at bases bilaterally, no wheezing. Abdomen: Abdomen soft.  No TTP or guarding. No ascites, distension, hepatosplenomegaly.   MSK: No deformities or effusions.  Diffuse loss of subcutaneous muscle mass and fat. Neuro: Awake and alert, feeding himself breakfast.  EOMI, moves all extremities with severe generalized weakness, but symmetric strength. Speech fluent.    Psych: Sensorium intact and responding to questions, attention normal. Affect blunted.  Judgment and insight appear moderately impaired.  Oriented to self, hospital, not to year or specific hospital.    Data Reviewed: I have personally reviewed following labs and  imaging studies:  CBC: Recent Labs  Lab 11/28/2019 1208 11/24/19 0015  WBC 8.8 7.9  NEUTROABS 7.3  --   HGB 9.9* 9.6*  HCT 32.0* 31.1*  MCV 97.0 97.8  PLT 215 150   Basic Metabolic Panel: Recent Labs  Lab 12/05/2019 1208 11/24/19 0015  NA 139 140  K 4.5 3.7  CL 103 101  CO2 25 29  GLUCOSE 180* 259*  BUN 34* 34*  CREATININE 1.72* 1.60*  CALCIUM 8.9 8.6*   GFR: Estimated Creatinine Clearance: 51 mL/min (A) (by C-G formula based on SCr of 1.6 mg/dL (H)). Liver Function Tests: Recent Labs  Lab 12/06/2019 1208 11/24/19 0015  AST 19 16  ALT 13 12  ALKPHOS 100 97  BILITOT 1.0 0.7  PROT 6.9 6.7  ALBUMIN 3.1* 2.8*   No results for input(s): LIPASE, AMYLASE in the last 168 hours. Recent Labs  Lab 11/22/2019  1951  AMMONIA <9*   Coagulation Profile: Recent Labs  Lab 11/25/2019 1208  INR 1.9*   Cardiac Enzymes: No results for input(s): CKTOTAL, CKMB, CKMBINDEX, TROPONINI in the last 168 hours. BNP (last 3 results) No results for input(s): PROBNP in the last 8760 hours. HbA1C: No results for input(s): HGBA1C in the last 72 hours. CBG: Recent Labs  Lab 11/28/2019 2051 11/24/19 0755  GLUCAP 155* 146*   Lipid Profile: No results for input(s): CHOL, HDL, LDLCALC, TRIG, CHOLHDL, LDLDIRECT in the last 72 hours. Thyroid Function Tests: Recent Labs    11/15/2019 1208  TSH 2.583   Anemia Panel: Recent Labs    11/17/2019 1951  VITAMINB12 444   Urine analysis:    Component Value Date/Time   COLORURINE YELLOW 10/25/2019 0424   APPEARANCEUR CLEAR 10/25/2019 0424   APPEARANCEUR Clear 11/06/2014 1527   LABSPEC 1.009 10/25/2019 0424   LABSPEC 1.008 11/06/2014 1527   PHURINE 6.0 10/25/2019 0424   GLUCOSEU NEGATIVE 10/25/2019 0424   GLUCOSEU Negative 11/06/2014 1527   HGBUR NEGATIVE 10/25/2019 0424   BILIRUBINUR NEGATIVE 10/25/2019 0424   BILIRUBINUR Negative 11/06/2014 1527   KETONESUR NEGATIVE 10/25/2019 0424   PROTEINUR 100 (A) 10/25/2019 0424   NITRITE NEGATIVE  10/25/2019 0424   LEUKOCYTESUR NEGATIVE 10/25/2019 0424   LEUKOCYTESUR Negative 11/06/2014 1527   Sepsis Labs: @LABRCNTIP (procalcitonin:4,lacticacidven:4)  ) Recent Results (from the past 240 hour(s))  SARS Coronavirus 2 by RT PCR (hospital order, performed in [place] hospital lab) Nasopharyngeal Nasopharyngeal Swab     Status: None   Collection Time: 11/24/2019  2:05 PM   Specimen: Nasopharyngeal Swab  Result Value Ref Range Status   SARS Coronavirus 2 NEGATIVE NEGATIVE Final    Comment: (NOTE) SARS-CoV-2 target nucleic acids are NOT DETECTED. The SARS-CoV-2 RNA is generally detectable in upper and lower respiratory specimens during the acute phase of infection. The lowest concentration of SARS-CoV-2 viral copies this assay can detect is 250 copies / mL. A negative result does not preclude SARS-CoV-2 infection and should not be used as the sole basis for treatment or other patient management decisions.  A negative result may occur with improper specimen collection / handling, submission of specimen other than nasopharyngeal swab, presence of viral mutation(s) within the areas targeted by this assay, and inadequate number of viral copies (<250 copies / mL). A negative result must be combined with clinical observations, patient history, and epidemiological information. Fact Sheet for Patients:   StrictlyIdeas.no Fact Sheet for Healthcare Providers: BankingDealers.co.za This test is not yet approved or cleared  by the [place] FDA and has been authorized for detection and/or diagnosis of SARS-CoV-2 by FDA under an Emergency Use Authorization (EUA).  This EUA will remain in effect (meaning this test can be used) for the duration of the COVID-19 declaration under Section 564(b)(1) of the Act, 21 U.S.C. section 360bbb-3(b)(1), unless the authorization is terminated or revoked sooner. Performed at [facility], [address] [place], [place] [address]   Culture, blood (x 2)     Status: None (Preliminary result)   Collection Time: 12/04/2019  8:54 PM   Specimen: BLOOD  Result Value Ref Range Status   Specimen Description BLOOD RAC  Final   Special Requests BOTTLES DRAWN AEROBIC AND ANAEROBIC BCAV  Final   Culture   Final    NO GROWTH < 12 HOURS Performed at [facility], [address] [place], [place] [address]    Report Status PENDING  Incomplete  Culture, blood (  x 2)     Status: None (Preliminary result)   Collection Time: 11/15/2019  8:54 PM   Specimen: BLOOD  Result Value Ref Range Status   Specimen Description BLOOD BRH  Final   Special Requests BOTTLES DRAWN AEROBIC AND ANAEROBIC BCAV  Final   Culture   Final    NO GROWTH < 12 HOURS Performed at [facility], [address], [place], [place] [address]    Report Status PENDING  Incomplete         Radiology Studies: DG Chest 2 View  Result Date: 11/16/2019 CLINICAL DATA:  Shortness of breath, fall EXAM: CHEST - 2 VIEW COMPARISON:  11/13/2019 FINDINGS: Prior CABG. Stable cardiomegaly. Diffusely increased interstitial markings throughout both lungs with patchy scattered patchy opacities in the bilateral lung bases and peripheral aspect of the right upper lobe. No large pleural fluid collection. No pneumothorax. Degenerative changes of the thoracic spine suggesting DISH. IMPRESSION: Findings suggestive of congestive heart failure with pulmonary edema. A superimposed infectious process would be difficult to exclude. Electronically Signed   By: Davina Poke D.O.   On: 11/19/2019 12:37   CT HEAD WO CONTRAST  Result Date: 12/06/2019 CLINICAL DATA:  Fall. EXAM: CT HEAD WITHOUT CONTRAST TECHNIQUE: Contiguous axial images were obtained from the base of the skull through the vertex without intravenous contrast. COMPARISON:  MR brain and CT head dated April 01, 2019. FINDINGS: Brain: No evidence of acute infarction, hemorrhage,  hydrocephalus, extra-axial collection or mass lesion/mass effect. Stable mild atrophy and chronic microvascular ischemic changes. Small remote lacunar infarct in the right cerebellum again noted. Vascular: Calcified atherosclerosis at the skullbase. No hyperdense vessel. Skull: Normal. Negative for fracture or focal lesion. Sinuses/Orbits: No acute finding. Other: None. IMPRESSION: No acute intracranial abnormality. Electronically Signed   By: Titus Dubin M.D.   On: 11/17/2019 12:47        Scheduled Meds: . amiodarone  200 mg Oral Daily  . apixaban  5 mg Oral BID  . aspirin  81 mg Oral Daily  . atorvastatin  80 mg Oral QPM  . ezetimibe  10 mg Oral Daily  . famotidine  20 mg Oral BID  . furosemide  40 mg Intravenous BID  . insulin aspart  0-15 Units Subcutaneous TID WC  . insulin aspart  0-5 Units Subcutaneous QHS  . insulin glargine  5 Units Subcutaneous Daily  . levETIRAcetam  500 mg Oral TID  . mouth rinse  15 mL Mouth Rinse BID  . metoprolol tartrate  37.5 mg Oral BID  . pantoprazole  40 mg Oral Daily  . pregabalin  50 mg Oral TID  . venlafaxine  75 mg Oral TID   Continuous Infusions:    LOS: 1 day    Time spent: 35 minutes    Edwin Dada, MD [facility] 11/24/2019, 9:38 AM     Please page though [place] or Epic secure chat:  For [employer], Adult nurse

## 2019-11-24 NOTE — Progress Notes (Signed)
Initial Nutrition Assessment  DOCUMENTATION CODES:   Not applicable  INTERVENTION:   RD will add supplements pending SLP evaluation   NUTRITION DIAGNOSIS:   Increased nutrient needs related to chronic illness(CHF, COPD) as evidenced by increased estimated needs.  GOAL:   Patient will meet greater than or equal to 90% of their needs  MONITOR:   PO intake, Supplement acceptance, Labs, Weight trends, Skin, I & O's  REASON FOR ASSESSMENT:   Malnutrition Screening Tool    ASSESSMENT:   58 y.o. M with hx CAD s/p PCI x2 2015 and CABG x5 last month at [facility], postop A. fib on amiodarone and Eliquis, COPD on 2 L home O2, depression, chronic pain, DM, HTN, CHF EF 45%, obesity, PVD status post aortofemoral bypass, CKD 3B and NASH who presents with fall.   Unable to obtain nutrition related history as pt with AMS. Per chart, pt eating 100% of meals prior to NPO. Pt is pending SLP evaluation; RD will add supplements once diet advanced. Per chart, pt down 4lbs(2%) over the past 10 days; this is not significant. Per MD note, pt to transfer to [facility] for possible CABG.   Medications reviewed and include: aspirin, pepcid, lasix, insulin, protonix  Labs reviewed: BUN 34(H), creat 1.60(H) Hgb 9.6(L), Hct 31.1(L) cbgs- 146, 225 x 24 hrs AIC 5.3  NUTRITION - FOCUSED PHYSICAL EXAM:    Most Recent Value  Orbital Region  No depletion  Upper Arm Region  No depletion  Thoracic and Lumbar Region  No depletion  Buccal Region  No depletion  Temple Region  No depletion  Clavicle Bone Region  No depletion  Clavicle and Acromion Bone Region  No depletion  Scapular Bone Region  No depletion  Dorsal Hand  No depletion  Patellar Region  No depletion  Anterior Thigh Region  No depletion  Posterior Calf Region  No depletion  Edema (RD Assessment)  Mild  Hair  Reviewed  Eyes  Reviewed  Mouth  Reviewed  Skin  Reviewed  Nails  Reviewed     Diet Order:   Diet Order            Diet NPO time  specified  Diet effective now             EDUCATION NEEDS:   Not appropriate for education at this time  Skin:  Skin Assessment: Reviewed RN Assessment  Last BM:  pta  Height:   Ht Readings from Last 1 Encounters:  11/25/2019 5' 7"  (1.702 m)    Weight:   Wt Readings from Last 1 Encounters:  11/24/19 86.8 kg    Ideal Body Weight:  67.3 kg  BMI:  Body mass index is 29.97 kg/m.  Estimated Nutritional Needs:   Kcal:  2000-2300kcal/day  Protein:  100-115g/day  Fluid:  2L/day  Koleen Distance MS, RD, LDN Please refer to [facility] for RD and/or RD on-call/weekend/after hours pager

## 2019-11-24 NOTE — Progress Notes (Signed)
*  PRELIMINARY RESULTS* Echocardiogram 2D Echocardiogram has been performed.  Larry Terrell 11/24/2019, 2:20 PM

## 2019-11-24 NOTE — Evaluation (Signed)
Physical Therapy Evaluation Patient Details Name: Larry Terrell MRN: [id number] DOB: [birth year] Today's Date: 11/24/2019   History of Present Illness  Pt is a 59 y.o. M with hx CAD s/p PCI x2 2015 and CABG x5 last month at [facility], postop A. fib on amiodarone and Eliquis, COPD on 2 L home O2, depression, chronic pain, DM, HTN, CHF EF 45%, obesity, and PVD status post aortofemoral bypass, CKD 3B baseline creatinine 1.7, and NASH who presents with fall, and confusion placed on bipap in ED. MRI + for Acute left PCA territory infarct, Punctate focus of restricted diffusion in the left corona radiata, consistent with acute infarct. 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    Clinical Impression  Pt alert, agreeable to PT, denied pain. Oriented to self and location, disoriented to time, family reported that this is not uncommon. Pt in good spirits throughout session, often joking with PT and family in room. Per pt and family, after recent rehab stay pt was ambulating at home without an AD, needed assistance for ADLs recently, lives with his brother and cousin. 1 fall that led to this admission.  Upon assessment patient demonstrated deficits in coordination, generalized weakness (questionable LUE and LLE weakness, pt also complained of L hip weakness that may have limited ROM/strength), and impaired visual convergence. Able to visually track with max cueing. Supine to sit and sit <> stand ModA. Once sitting EOB pt exhibited fair balance, and able to scoot anteriorly towards EOB with CGA. The patient needed modA with RW to take several steps to the recliner in room, cued for posture, RW positioning/management and to avoid sitting too early. Echo tech in room, further mobility deferred.  Overall the patient demonstrated deficits (see "PT Problem List") that impede the patient's functional abilities, safety, and mobility and would benefit from skilled PT intervention. Recommend transition to acute  inpatient rehab upon discharge for high-intensity, post-acute rehab services to maximize mobility, safety, and independence. Pt with very supportive family, in room throughout session.   Of note, pt 4wks s/p CABGx5 (4/15), may still need sternal precautions moving forward.        Follow Up Recommendations CIR    Equipment Recommendations  Other (comment);Rolling walker with 5" wheels(TBD at next venue of care)    Recommendations for Other Services OT consult;Speech consult     Precautions / Restrictions Precautions Precautions: Sternal;Fall Precaution Booklet Issued: No Restrictions Weight Bearing Restrictions: Yes Other Position/Activity Restrictions: sternal precautions      Mobility  Bed Mobility Overal bed mobility: Needs Assistance Bed Mobility: Supine to Sit     Supine to sit: Mod assist;HOB elevated     General bed mobility comments: use of bed rails, cued for sequencing. able to scoot anteriorly towards EOB in prep for transfer with CGA, heavy use of UEs  Transfers Overall transfer level: Needs assistance Equipment used: Rolling walker (2 wheeled) Transfers: Sit to/from Stand Sit to Stand: Mod assist         General transfer comment: modA to come to complete standing, deficits in initial standing balance noted  Ambulation/Gait Ambulation/Gait assistance: Mod assist Gait Distance (Feet): 2 Feet Assistive device: Rolling walker (2 wheeled)       General Gait Details: pt with flexed trunk with very short strides with cueing for upright posture, foot placement.  Stairs            Wheelchair Mobility    Modified Rankin (Stroke Patients Only)       Balance Overall  balance assessment: Needs assistance Sitting-balance support: Feet supported Sitting balance-Leahy Scale: Fair Sitting balance - Comments: able to sit EOB with guarding     Standing balance-Leahy Scale: Poor Standing balance comment: reliant on RW and PT assist to maintain  balance                             Pertinent Vitals/Pain Pain Assessment: Faces Faces Pain Scale: No hurt Pain Intervention(s): Limited activity within patient's tolerance;Repositioned    Home Living Family/patient expects to be discharged to:: Private residence Living Arrangements: Other relatives(cousin) Available Help at Discharge: Available 24 hours/day Type of Home: House Home Access: Ramped entrance     Home Layout: One level Home Equipment: Walker - 2 wheels;Grab bars - toilet;Grab bars - tub/shower;Cane - single point Additional Comments: information on equipment taken from previous admission due to poor ability to recall this date    Prior Function Level of Independence: Needs assistance   Gait / Transfers Assistance Needed: Family stated that after his rehab staym, he was walking, walking without a cane but does have a cane  ADL's / Homemaking Assistance Needed: brother and cousin help with home tasks  Comments: 1 fall, that led to admission     Hand Dominance   Dominant Hand: Right    Extremity/Trunk Assessment   Upper Extremity Assessment Upper Extremity Assessment: Defer to OT evaluation;RUE deficits/detail;LUE deficits/detail RUE Deficits / Details: grossly 4-/5 RUE Coordination: decreased fine motor;decreased gross motor LUE Deficits / Details: grossly 3+/5 LUE Coordination: decreased fine motor;decreased gross motor    Lower Extremity Assessment Lower Extremity Assessment: RLE deficits/detail;LLE deficits/detail RLE Deficits / Details: able to lift extremities against gravity, grossly 4/5 RLE Coordination: decreased gross motor LLE Deficits / Details: able to lift extremities against gravity. Grossly 4-/5, pt did endorse L hip pain, difficulty assessing true weakness versus deficits caused by pain LLE Coordination: decreased gross motor       Communication   Communication: Other (comment)(slurred/garbled speech)  Cognition  Arousal/Alertness: Awake/alert Behavior During Therapy: WFL for tasks assessed/performed Overall Cognitive Status: History of cognitive impairments - at baseline                                 General Comments: Able to folow all commands without difficulty. Often joking with therapist/family in room. Disoriented to time, family reports that is not uncommon      General Comments General comments (skin integrity, edema, etc.): on 4L via [place] throughout    Exercises     Assessment/Plan    PT Assessment Patient needs continued PT services  PT Problem List Decreased strength;Decreased range of motion;Decreased activity tolerance;Decreased balance;Decreased mobility;Decreased coordination;Decreased cognition;Decreased knowledge of use of DME;Decreased safety awareness;Cardiopulmonary status limiting activity;Decreased knowledge of precautions;Pain       PT Treatment Interventions DME instruction;Gait training;Functional mobility training;Therapeutic activities;Therapeutic exercise;Balance training;Cognitive remediation;Patient/family education    PT Goals (Current goals can be found in the Care Plan section)  Acute Rehab PT Goals Patient Stated Goal: to get stronger PT Goal Formulation: With patient Time For Goal Achievement: 12/08/19 Potential to Achieve Goals: Good    Frequency 7X/week   Barriers to discharge        Co-evaluation               AM-PAC PT "6 Clicks" Mobility  Outcome Measure Help needed turning from your back to your side while  in a flat bed without using bedrails?: A Lot Help needed moving from lying on your back to sitting on the side of a flat bed without using bedrails?: A Lot Help needed moving to and from a bed to a chair (including a wheelchair)?: A Lot Help needed standing up from a chair using your arms (e.g., wheelchair or bedside chair)?: A Lot Help needed to walk in hospital room?: A Lot Help needed climbing 3-5 steps with a  railing? : Total 6 Click Score: 11    End of Session Equipment Utilized During Treatment: Gait belt;Oxygen(4L) Activity Tolerance: Patient tolerated treatment well Patient left: in chair;with nursing/sitter in room;with call bell/phone within reach(with echo tech in room) Nurse Communication: Mobility status;Precautions PT Visit Diagnosis: Unsteadiness on feet (R26.81);Other abnormalities of gait and mobility (R26.89);Muscle weakness (generalized) (M62.81);Difficulty in walking, not elsewhere classified (R26.2)    Time: 3299-2426 PT Time Calculation (min) (ACUTE ONLY): 33 min   Charges:   PT Evaluation $PT Eval Moderate Complexity: 1 Mod PT Treatments $Therapeutic Activity: 8-22 mins       Lieutenant Diego PT, DPT 2:19 PM,11/24/19

## 2019-11-24 NOTE — Procedures (Signed)
ELECTROENCEPHALOGRAM REPORT   Patient: Larry Terrell       Room #: 249A-AA EEG No. ID: [id number] Age: 59 y.o.        Sex: male Requesting Physician: Danford Report Date:  11/24/2019        Interpreting Physician: Alexis Goodell  History: Larry Terrell is an 59 y.o. male with altered mental status  Medications:  Amiodarone, Eliquis, ASA, Insulin, Lasix, Keppra, Lopressor, Lyrica, Effexor, Zetia, Lipitor  Conditions of Recording:  This is a 21 channel routine scalp EEG performed with bipolar and monopolar montages arranged in accordance to the international 10/20 system of electrode placement. One channel was dedicated to EKG recording.  The patient is in the awake state.  Description:  The background activity is slow and poorly organized.  It consists of low voltage activity in the delta-theta continuum.  This activity is diffusely distributed and continuous throughout the recording.   No epileptiform activity is noted.   Hyperventilation was not performed. Intermittent photic stimulation was performed but failed to illicit any change in the tracing.   IMPRESSION: This is an abnormal EEG secondary to general background slowing.  This finding may be seen with a diffuse disturbance that is etiologically nonspecific, but may include a metabolic encephalopathy, among other possibilities.  No epileptiform activity was noted.     Alexis Goodell, MD Neurology [phone] 11/24/2019, 4:41 PM

## 2019-11-24 NOTE — Progress Notes (Signed)
eeg completed ° °

## 2019-11-24 NOTE — Consult Note (Signed)
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 A silicone foam dressing is used as a cover dressing for atraumatic removal.   WOC nursing team will not follow, but will remain available to this patient, the nursing and medical teams.  Please re-consult if needed. Thanks, Maudie Flakes, MSN, RN, [place], Arther Abbott  Pager# [phone]

## 2019-11-24 NOTE — Consult Note (Signed)
Requesting Physician: Danford    Chief Complaint: AMS  I have been asked by Dr. Loleta Books to see this patient in consultation for acute infarct.  HPI: Larry Terrell is an 59 y.o. male with hx CAD s/p PCI x2 2015 and CABG x5 last month at [facility], postop A. fib on amiodarone and Eliquis, COPD on 2 L home O2, depression, chronic pain, DM, HTN, CHF EF 45%, obesity, and PVD status post aortofemoral bypass, CKD 3B baseline creatinine 1.7, and NASH who presented on yesterday after a fall.  Patient has been confused since that time.  Has complained of SOB and was noted to be hypoxic. Imaging revealed an acute infarct.  Consult called for further recommendations.  Initial NIHSS of 3.    Date last known well: 11/26/2019 Time last known well: Unable to determine tPA Given: No: Patient on Eliquis  Past Medical History:  Diagnosis Date  . CHF (congestive heart failure) (HCC)    diastolic (echo at [facility] 3704) EF 55-60%  . Cirrhosis ([place])    NASH per records  . COPD (chronic obstructive pulmonary disease) ([place])   . Depression   . Diabetes mellitus with neuropathy ([place])   . Heart attack ([place])   . Neuropathy   . OSA (obstructive sleep apnea)   . Peripheral vascular disease due to secondary diabetes mellitus ([place])   . Splenomegaly   . 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 TIA (transient ischemic attack) April 2016    Past Surgical History:  Procedure Laterality Date  . AORTA - FEMORAL ARTERY BYPASS GRAFT Bilateral   . CARDIAC CATHETERIZATION    . cardiac stents  July 2015   . CORONARY ARTERY BYPASS GRAFT N/A 10/26/2019   Procedure: CORONARY ARTERY BYPASS GRAFTING (CABG) x 5, Using Bilateral mammary arteries, and right leg greater saphenous vein harvested endoscopically;  Surgeon: Wonda Olds, MD;  Location: [place];  Service: Open Heart Surgery;  Laterality: N/A;  . CORONARY STENT INTERVENTION N/A 08/09/2017   Procedure: CORONARY STENT INTERVENTION;  Surgeon: Wellington Hampshire, MD;  Location:  [place] [facility];  Service: Cardiovascular;  Laterality: N/A;  . LAPAROSCOPIC APPENDECTOMY N/A 07/10/2015   Procedure: APPENDECTOMY LAPAROSCOPIC;  Surgeon: Rolm Bookbinder, MD;  Location: [place];  Service: General;  Laterality: N/A;  . LEFT HEART CATH AND CORONARY ANGIOGRAPHY N/A 08/09/2017   Procedure: LEFT HEART CATH AND CORONARY ANGIOGRAPHY;  Surgeon: Wellington Hampshire, MD;  Location: [place] [facility];  Service: Cardiovascular;  Laterality: N/A;  . LEFT HEART CATH AND CORONARY ANGIOGRAPHY N/A 10/19/2019   Procedure: LEFT HEART CATH AND CORONARY ANGIOGRAPHY;  Surgeon: Minna Merritts, MD;  Location: [place] [facility];  Service: Cardiovascular;  Laterality: N/A;  . RADIAL ARTERY HARVEST Left 10/26/2019   Procedure: RADIAL ARTERY HARVEST;  Surgeon: Wonda Olds, MD;  Location: [place];  Service: Open Heart Surgery;  Laterality: Left;  . TEE WITHOUT CARDIOVERSION N/A 10/26/2019   Procedure: TRANSESOPHAGEAL ECHOCARDIOGRAM (TEE);  Surgeon: Wonda Olds, MD;  Location: [place];  Service: Open Heart Surgery;  Laterality: N/A;    Family History  Problem Relation Age of Onset  . Heart attack Mother 7  . Stroke Mother   . Heart attack Father   . Alzheimer's disease Father    Social History:  reports that he quit smoking about 12 years ago. His smoking use included cigarettes. He has never used smokeless tobacco. He reports that he does not drink alcohol or use drugs.  Allergies:  Allergies  Allergen Reactions  . Gabapentin   . Ace Inhibitors Cough    Other reaction(s): Other (See Comments) cough    Medications:  I have reviewed the patient's current medications. Prior to Admission:  Medications Prior to Admission  Medication Sig Dispense Refill Last Dose  . acetaminophen (TYLENOL) 500 MG tablet Take 500-1,000 mg by mouth daily as needed for mild pain or fever.    Unknown at PRN  . amiodarone (PACERONE) 200 MG tablet Take 1 tablet (200 mg total) by mouth daily.   12/02/2019  at 0900  . apixaban (ELIQUIS) 5 MG TABS tablet Take 1 tablet (5 mg total) by mouth 2 (two) times daily. 60 tablet  12/07/2019 at 0900  . aspirin 81 MG chewable tablet Chew 1 tablet (81 mg total) by mouth daily. 30 tablet 1 12/06/2019 at 0900  . atorvastatin (LIPITOR) 80 MG tablet Take 1 tablet (80 mg total) by mouth daily. (Patient taking differently: Take 80 mg by mouth every evening. ) 90 tablet 3 11/22/2019 at 1800  . ezetimibe (ZETIA) 10 MG tablet Take 1 tablet (10 mg total) by mouth daily. (Patient taking differently: Take 10 mg by mouth daily in the afternoon. ) 90 tablet 3 11/22/2019 at 1700  . famotidine (PEPCID) 20 MG tablet Take 1 tablet (20 mg total) by mouth 2 (two) times daily. 8 tablet 0 11/27/2019 at 0600  . insulin aspart (NOVOLOG) 100 UNIT/ML injection Inject 0-15 Units into the skin 4 (four) times daily - after meals and at bedtime. (Patient taking differently: Inject 0-20 Units into the skin 4 (four) times daily - after meals and at bedtime. Inject according to blood sugar readings:  < 60u: 0u and contact MD 120-160: 2u 161-200: 4u 201-250: 8u 251-300: 12u 301-350: 16u 351-450: contact MD) 10 mL 11   . insulin aspart (NOVOLOG) 100 UNIT/ML injection Inject 10 Units into the skin 3 (three) times daily with meals. 10 mL 11 11/18/2019 at 0730  . insulin glargine (LANTUS) 100 UNIT/ML injection Inject 0.05 mLs (5 Units total) into the skin daily. 10 mL 11 11/22/2019 at 0730  . isosorbide dinitrate (ISORDIL) 20 MG tablet Take 1 tablet (20 mg total) by mouth 3 (three) times daily. For 1 month, then discontinue (Patient taking differently: Take 20 mg by mouth 3 (three) times daily. )   11/12/2019 at 0900  . levETIRAcetam (KEPPRA) 500 MG tablet Take 500 mg by mouth 3 (three) times daily.   11/17/2019 at 0900  . metFORMIN (GLUCOPHAGE) 500 MG tablet Take 500 mg by mouth 2 (two) times daily with a meal.   11/18/2019 at 0730  . Metoprolol Tartrate 37.5 MG TABS Take 37.5 mg by mouth 2 (two) times  daily.   11/13/2019 at 0900  . nitroGLYCERIN (NITROSTAT) 0.4 MG SL tablet Place 0.4 mg under the tongue every 5 (five) minutes as needed for chest pain.   Unknown at PRN  . oxyCODONE (OXY IR/ROXICODONE) 5 MG immediate release tablet Take 1-2 tablets (5-10 mg total) by mouth every 4 (four) hours as needed for severe pain. 30 tablet 0 Unknown at PRN  . pantoprazole (PROTONIX) 40 MG tablet Take 1 tablet (40 mg total) by mouth daily. 30 tablet 0 11/22/2019 at 0600  . pregabalin (LYRICA) 50 MG capsule Take 1 capsule (50 mg total) by mouth 3 (three) times daily. 90 capsule 2 11/30/2019 at 090  . therapeutic multivitamin-minerals (THERAGRAN-M) tablet Take 1 tablet by mouth daily.   11/22/2019 at 1600  . venlafaxine [facility])  75 MG tablet Take 75 mg by mouth 3 (three) times daily.   12/02/2019 at 0900   Scheduled: .  stroke: mapping our early stages of recovery book   Does not apply Once  . amiodarone  200 mg Oral Daily  . apixaban  5 mg Oral BID  . aspirin  81 mg Oral Daily  . atorvastatin  80 mg Oral QPM  . ezetimibe  10 mg Oral Daily  . famotidine  20 mg Oral BID  . furosemide  40 mg Intravenous BID  . insulin aspart  0-15 Units Subcutaneous TID WC  . insulin aspart  0-5 Units Subcutaneous QHS  . insulin glargine  5 Units Subcutaneous Daily  . levETIRAcetam  500 mg Oral TID  . mouth rinse  15 mL Mouth Rinse BID  . metoprolol tartrate  37.5 mg Oral BID  . pantoprazole  40 mg Oral Daily  . pregabalin  50 mg Oral TID  . venlafaxine  75 mg Oral TID    ROS: History obtained from the patient  General ROS: negative for - chills, fatigue, fever, night sweats, weight gain or weight loss Psychological ROS: negative for - behavioral disorder, hallucinations, memory difficulties, mood swings or suicidal ideation Ophthalmic ROS: negative for - blurry vision, double vision, eye pain or loss of vision ENT ROS: negative for - epistaxis, nasal discharge, oral lesions, sore throat, tinnitus or vertigo Allergy  and Immunology ROS: negative for - hives or itchy/watery eyes Hematological and Lymphatic ROS: negative for - bleeding problems, bruising or swollen lymph nodes Endocrine ROS: negative for - galactorrhea, hair pattern changes, polydipsia/polyuria or temperature intolerance Respiratory ROS: shortness of breath Cardiovascular ROS: negative for - chest pain, dyspnea on exertion, edema or irregular heartbeat Gastrointestinal ROS: negative for - abdominal pain, diarrhea, hematemesis, nausea/vomiting or stool incontinence Genito-Urinary ROS: negative for - dysuria, hematuria, incontinence or urinary frequency/urgency Musculoskeletal ROS: negative for - joint swelling or muscular weakness Neurological ROS: as noted in HPI Dermatological ROS: negative for rash and skin lesion changes  Physical Examination: Blood pressure (!) 152/75, pulse 67, temperature 97.7 F (36.5 C), temperature source Oral, resp. rate 19, height 5' 7"  (1.702 m), weight 86.8 kg, SpO2 95 %.  HEENT-  Normocephalic, no lesions, without obvious abnormality.  Normal external eye and conjunctiva.  Normal TM's bilaterally.  Normal auditory canals and external ears. Normal external nose, mucus membranes and septum.  Normal pharynx. Cardiovascular- S1, S2 normal, pulses palpable throughout   Lungs- chest clear, no wheezing, rales, normal symmetric air entry Abdomen- soft, non-tender; bowel sounds normal; no masses,  no organomegaly Extremities- no edema Lymph-no adenopathy palpable Musculoskeletal-no joint tenderness, deformity or swelling Skin-warm and dry, no hyperpigmentation, vitiligo, or suspicious lesions  Neurological Examination   Mental Status: Alert, oriented to name but not to age or year/month.  Speech fluent.  Requires some reinforcement to follow 3-step commands Cranial Nerves: II: Visual fields grossly normal, pupils equal, round, reactive to Babineaux and accommodation III,IV, VI: ptosis not present, extra-ocular motions  intact bilaterally V,VII: smile symmetric, facial Esterline touch sensation normal bilaterally VIII: hearing normal bilaterally IX,X: gag reflex present XI: bilateral shoulder shrug XII: midline tongue extension Motor: Right : Upper extremity   5/5    Left:     Upper extremity   5/5  Lower extremity   5/5     Lower extremity   5/5 Tone and bulk:normal tone throughout; no atrophy noted Sensory: Pinprick and Henney touch intact throughout, bilaterally Deep Tendon Reflexes: Symmetric  throughout Plantars: Right: mute   Left: mute Cerebellar: Normal finger-to-nose and normal heel-to-shin testing bilaterally Gait: not tested due to safety concerns   Laboratory Studies:  Basic Metabolic Panel: Recent Labs  Lab 11/14/2019 1208 11/24/19 0015  NA 139 140  K 4.5 3.7  CL 103 101  CO2 25 29  GLUCOSE 180* 259*  BUN 34* 34*  CREATININE 1.72* 1.60*  CALCIUM 8.9 8.6*    Liver Function Tests: Recent Labs  Lab 11/20/2019 1208 11/24/19 0015  AST 19 16  ALT 13 12  ALKPHOS 100 97  BILITOT 1.0 0.7  PROT 6.9 6.7  ALBUMIN 3.1* 2.8*   No results for input(s): LIPASE, AMYLASE in the last 168 hours. Recent Labs  Lab 12/10/2019 1951  AMMONIA <9*    CBC: Recent Labs  Lab 11/13/2019 1208 11/24/19 0015  WBC 8.8 7.9  NEUTROABS 7.3  --   HGB 9.9* 9.6*  HCT 32.0* 31.1*  MCV 97.0 97.8  PLT 215 205    Cardiac Enzymes: No results for input(s): CKTOTAL, CKMB, CKMBINDEX, TROPONINI in the last 168 hours.  BNP: Invalid input(s): POCBNP  CBG: Recent Labs  Lab 11/19/2019 2051 11/24/19 0755 11/24/19 1132  GLUCAP 155* 146* 225*    Microbiology: Results for orders placed or performed during the hospital encounter of 12/11/2019  SARS Coronavirus 2 by RT PCR (hospital order, performed in [facility] hospital lab) Nasopharyngeal Nasopharyngeal Swab     Status: None   Collection Time: 11/22/2019  2:05 PM   Specimen: Nasopharyngeal Swab  Result Value Ref Range Status   SARS Coronavirus 2 NEGATIVE  NEGATIVE Final    Comment: (NOTE) SARS-CoV-2 target nucleic acids are NOT DETECTED. The SARS-CoV-2 RNA is generally detectable in upper and lower respiratory specimens during the acute phase of infection. The lowest concentration of SARS-CoV-2 viral copies this assay can detect is 250 copies / mL. A negative result does not preclude SARS-CoV-2 infection and should not be used as the sole basis for treatment or other patient management decisions.  A negative result may occur with improper specimen collection / handling, submission of specimen other than nasopharyngeal swab, presence of viral mutation(s) within the areas targeted by this assay, and inadequate number of viral copies (<250 copies / mL). A negative result must be combined with clinical observations, patient history, and epidemiological information. Fact Sheet for Patients:   StrictlyIdeas.no Fact Sheet for Healthcare Providers: BankingDealers.co.za This test is not yet approved or cleared  by the [place] FDA and has been authorized for detection and/or diagnosis of SARS-CoV-2 by FDA under an Emergency Use Authorization (EUA).  This EUA will remain in effect (meaning this test can be used) for the duration of the COVID-19 declaration under Section 564(b)(1) of the Act, 21 U.S.C. section 360bbb-3(b)(1), unless the authorization is terminated or revoked sooner. Performed at [facility], [address] [place], [place] [address]   Culture, blood (x 2)     Status: None (Preliminary result)   Collection Time: 11/22/2019  8:54 PM   Specimen: BLOOD  Result Value Ref Range Status   Specimen Description BLOOD RAC  Final   Special Requests BOTTLES DRAWN AEROBIC AND ANAEROBIC BCAV  Final   Culture   Final    NO GROWTH < 12 HOURS Performed at [facility], [address], [place], [place] [address]    Report Status PENDING  Incomplete  Culture, blood (x 2)      Status: None (Preliminary result)   Collection Time:  11/14/2019  8:54 PM   Specimen: BLOOD  Result Value Ref Range Status   Specimen Description BLOOD [facility]  Final   Special Requests BOTTLES DRAWN AEROBIC AND ANAEROBIC BCAV  Final   Culture   Final    NO GROWTH < 12 HOURS Performed at [facility], [address] [place], [place] [address]    Report Status PENDING  Incomplete    Coagulation Studies: Recent Labs    11/21/2019 1208  LABPROT 20.8*  INR 1.9*    Urinalysis: No results for input(s): COLORURINE, LABSPEC, PHURINE, GLUCOSEU, HGBUR, BILIRUBINUR, KETONESUR, PROTEINUR, UROBILINOGEN, NITRITE, LEUKOCYTESUR in the last 168 hours.  Invalid input(s): APPERANCEUR  Lipid Panel:    Component Value Date/Time   CHOL 160 10/19/2019 0418   CHOL 172 11/07/2014 0418   TRIG 482 (H) 10/19/2019 0418   TRIG 298 (H) 11/07/2014 0418   HDL 23 (L) 10/19/2019 0418   HDL 25 (L) 11/07/2014 0418   CHOLHDL 7.0 10/19/2019 0418   VLDL UNABLE TO CALCULATE IF TRIGLYCERIDE OVER 400 mg/dL 10/19/2019 0418   VLDL 60 (H) 11/07/2014 0418   LDLCALC UNABLE TO CALCULATE IF TRIGLYCERIDE OVER 400 mg/dL 10/19/2019 0418   LDLCALC 87 11/07/2014 0418    HgbA1C:  Lab Results  Component Value Date   HGBA1C 5.3 11/24/2019    Urine Drug Screen:      Component Value Date/Time   LABOPIA NONE DETECTED 03/26/2019 2258   COCAINSCRNUR NONE DETECTED 03/26/2019 2258   LABBENZ NONE DETECTED 03/26/2019 2258   AMPHETMU NONE DETECTED 03/26/2019 2258   THCU POSITIVE (A) 03/26/2019 2258   LABBARB NONE DETECTED 03/26/2019 2258    Alcohol Level: No results for input(s): ETH in the last 168 hours.  Other results: EKG: atrial fibrillation, rate 87 bpm.  Imaging: DG Chest 2 View  Result Date: 11/20/2019 CLINICAL DATA:  Shortness of breath, fall EXAM: CHEST - 2 VIEW COMPARISON:  11/13/2019 FINDINGS: Prior CABG. Stable cardiomegaly. Diffusely increased interstitial markings throughout both lungs with patchy  scattered patchy opacities in the bilateral lung bases and peripheral aspect of the right upper lobe. No large pleural fluid collection. No pneumothorax. Degenerative changes of the thoracic spine suggesting DISH. IMPRESSION: Findings suggestive of congestive heart failure with pulmonary edema. A superimposed infectious process would be difficult to exclude. Electronically Signed   By: Davina Poke D.O.   On: 11/22/2019 12:37   CT HEAD WO CONTRAST  Result Date: 11/14/2019 CLINICAL DATA:  Fall. EXAM: CT HEAD WITHOUT CONTRAST TECHNIQUE: Contiguous axial images were obtained from the base of the skull through the vertex without intravenous contrast. COMPARISON:  MR brain and CT head dated April 01, 2019. FINDINGS: Brain: No evidence of acute infarction, hemorrhage, hydrocephalus, extra-axial collection or mass lesion/mass effect. Stable mild atrophy and chronic microvascular ischemic changes. Small remote lacunar infarct in the right cerebellum again noted. Vascular: Calcified atherosclerosis at the skullbase. No hyperdense vessel. Skull: Normal. Negative for fracture or focal lesion. Sinuses/Orbits: No acute finding. Other: None. IMPRESSION: No acute intracranial abnormality. Electronically Signed   By: Titus Dubin M.D.   On: 11/19/2019 12:47   MR BRAIN WO CONTRAST  Result Date: 11/24/2019 CLINICAL DATA:  Focal neurological deficit. Stroke suspected. EXAM: MRI HEAD WITHOUT CONTRAST TECHNIQUE: Multiplanar, multiecho pulse sequences of the brain and surrounding structures were obtained without intravenous contrast. COMPARISON:  Head CT Nov 23, 2019 FINDINGS: Brain: Area of restricted diffusion involving the splenium of the corpus callosum on the left side, posterior aspect of the left fornix,  lateral geniculate body, left occipital lobe and medial temporal lobe, including the body of the left hippocampus. Findings are consistent with a left PCA territory infarct. A single focus of restricted  diffusion is also seen in the left corona radiata. Remote lacunar infarcts are seen in the right cerebellar hemisphere. Scattered foci of T2 hyperintensity are seen within the white matter of cerebral hemispheres and within the pons, nonspecific, most likely related to chronic small vessel ischemia. More pronounced than expected for age. Mild prominence of the ventricular system and cerebellar sulci reflecting parenchymal volume loss. There is no hemorrhage, hydrocephalus, extra-axial collection or mass lesion. Vascular: Diminutive caliber of the horizontal petrous segment of the left ICA. Remainder of the major arterial flow voids are preserved. Skull and upper cervical spine: Normal marrow signal. Sinuses/Orbits: Left lens surgery. Other: None. IMPRESSION: 1. Acute left PCA territory infarct. 2. Punctate focus of restricted diffusion in the left corona radiata, consistent with acute infarct. 3. Diminutive caliber of the horizontal petrous segment of the left ICA. 4. 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 5. Moderate chronic small vessel ischemic changes. More pronounced than expected for age. These results were called by telephone at the time of interpretation on 11/24/2019 at 12:13 pm to provider [facility] , who verbally acknowledged these results. Electronically Signed   By: Pedro Earls M.D.   On: 11/24/2019 12:19    Assessment: 59 y.o. male with hx CAD s/p PCI x2 2015 and CABG [facility]5 last month at [facility], postop A. fib on amiodarone and Eliquis, COPD on 2 L home O2, depression, chronic pain, DM, HTN, CHF EF 45%, obesity, and PVD status post aortofemoral bypass, CKD 3B baseline creatinine 1.7, and NASH who presented on yesterday after a fall.  Patient has been confused since that time.  Initial head CT personally reviewed and shows no acute changes.  MRI of the brain personally reviewed as well and shows an acute left PCA territory infarct and a punctate acute infarct  in the left corona radiata.  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  Suspect embolic etiology for findings.  Patient not eligible for tPA and outside time window for intervention with low NIHSS and no focal weakness on neurological examination.  Patient on ASA and Eliquis.   A1c 5.3.   Stroke Risk Factors - atrial fibrillation, diabetes mellitus and hypertension  Plan: 1. Fasting lipid panel pending.  Patient on statin.  Target LDL<70.   2. PT consult, OT consult, Speech consult 3. Echocardiogram pending 4. Carotid dopplers pending 5. Prophylactic therapy-Continue Eliquis, ASA and statin 6. Telemetry monitoring 7. Frequent neuro checks   Alexis Goodell, MD Neurology [phone] 11/24/2019, 2:05 PM

## 2019-11-24 NOTE — Consult Note (Signed)
Cardiology Consultation:   Patient ID: [name] MRN: [id number]; DOB: 1961-05-25  Admit date: 11/11/2019 Date of Consult: 11/24/2019  Primary Care Provider: Medicine, [place] [facility] Primary Cardiologist: Ida Rogue, MD  Primary Electrophysiologist:  None    Patient Profile:   Larry Terrell is a 59 y.o. male with a hx of CAD s/p remote PCI and CABG x5 10/2019, post op Afib on amiodarone and Eliquis, HFpEF (EF 55-60%), TIA, PAD s/p arterial bifemoral bypass 2013 with known stenosis of bilateral SFA, DM2, hypertension, hyperlipidemia, COPD on 3L [place] at home, and prior tobacco use who is being seen today for the evaluation of acute on chronic HFpEF at the request of Dr. Joni Fears.  History of Present Illness:   Larry Terrell is a 59 year old male with PMH as above.    He is s/p remote MI and stenting to the RCA and LCx with presenting symptoms at the time of left shoulder pain.  Diagnostic cath for chest pain 07/30/2017 with significant two-vessel CAD and patent RCA stents with moderate ISR and severe subocclusion ISR in LCx.  He underwent successful balloon angioplasty and DES to the left circumflex to cover from the distal to the proximal area with 1 long stent.  He presented to [facility] 10/18/2019 with left shoulder discomfort, similar to his previous anginal equivalent.  LHC 10/2019 showed severe 3 v CAD with pLAD stenosis, ISR of the mRCA, and dRCA disease, occluded p-m LCx with ISR and severe ostial ramus disease.  Echo showed LVEF 55 to 60%, mild LVH, G2 DD, moderate LAE.  He was transferred to [facility] for consideration of CABG.  On 10/26/2019, he underwent CABG x5 (LIMA-LAD, RIMA-PDA, sequential SVG to dOM, OM1, diagonal).  He developed atrial fibrillation and was placed on amiodarone and Eliquis.  He was last seen in clinic 11/14/2019 after presenting from a SNF. He was feeling well but did report some lightheadedness. He was felt to be euvolemic. Drainage was noted at his sternal incision  and dressing applied. It was noted that ACE/ARB was discontinued in the hospital due to AKI and restart deferred that visit. He was also noted to be continued on Isordil per surgeon for 1 month s/p discharge. Amiodarone was reduced to 27m daily. BP was 130/80 with HR 75bpm and IRIR. Weight was 195 lbs.    The following information was obtained via chart bx as the patient is currently a poor historian. I reached out to CLaurann Montana NP, who reported that he was somewhat a poor historian at his last clinic visit but did not demonstrate the R sided facial droop observed as outlined below.   On 11/11/2019, he reportedly hit his head while still at the SNF and was brought to the ED 11/24/2019. In the ED, he was noted to be hypoxic and required [place] oxygen up to 6L. IV Lasix was administered with worsening mental status and patient admitted. During exam today, R sided facial droop and weakness was noted with brief neuro exam. He was also unable to articulate the reason for his admission. He was aware that he had fallen but was unable to state if this was due to mechanical fall versus presyncope / LOC. He was uncertain of his breathing status. He denied CP, racing HR, or palpitations. He was uncertain if he had presyncope or LOC. He denied any recent LEE or abdominal distention and felt he was at his baseline volume status. He was unable to respond and indicate if he had noted any  melena, hematochezia, BRBPR, or hematemesis. He was also unable to respond to state if he was taking his medication regularly, including his Eliquis and amiodarone.   Heart Pathway Score:     Past Medical History:  Diagnosis Date  . CHF (congestive heart failure) (HCC)    diastolic (echo at [facility] 2202) EF 55-60%  . Cirrhosis ([place])    NASH per records  . COPD (chronic obstructive pulmonary disease) ([place])   . Depression   . Diabetes mellitus with neuropathy ([place])   . Heart attack ([place])   . Neuropathy   . OSA (obstructive sleep apnea)     . Peripheral vascular disease due to secondary diabetes mellitus ([place])   . Splenomegaly   . Temporal giant cell arteritis ([place])   . TIA (transient ischemic attack) April 2016    Past Surgical History:  Procedure Laterality Date  . AORTA - FEMORAL ARTERY BYPASS GRAFT Bilateral   . CARDIAC CATHETERIZATION    . cardiac stents  July 2015   . CORONARY ARTERY BYPASS GRAFT N/A 10/26/2019   Procedure: CORONARY ARTERY BYPASS GRAFTING (CABG) x 5, Using Bilateral mammary arteries, and right leg greater saphenous vein harvested endoscopically;  Surgeon: Wonda Olds, MD;  Location: [place];  Service: Open Heart Surgery;  Laterality: N/A;  . CORONARY STENT INTERVENTION N/A 08/09/2017   Procedure: CORONARY STENT INTERVENTION;  Surgeon: Wellington Hampshire, MD;  Location: [place] [facility];  Service: Cardiovascular;  Laterality: N/A;  . LAPAROSCOPIC APPENDECTOMY N/A 07/10/2015   Procedure: APPENDECTOMY LAPAROSCOPIC;  Surgeon: Rolm Bookbinder, MD;  Location: [place];  Service: General;  Laterality: N/A;  . LEFT HEART CATH AND CORONARY ANGIOGRAPHY N/A 08/09/2017   Procedure: LEFT HEART CATH AND CORONARY ANGIOGRAPHY;  Surgeon: Wellington Hampshire, MD;  Location: [place] [facility];  Service: Cardiovascular;  Laterality: N/A;  . LEFT HEART CATH AND CORONARY ANGIOGRAPHY N/A 10/19/2019   Procedure: LEFT HEART CATH AND CORONARY ANGIOGRAPHY;  Surgeon: Minna Merritts, MD;  Location: [place] [facility];  Service: Cardiovascular;  Laterality: N/A;  . RADIAL ARTERY HARVEST Left 10/26/2019   Procedure: RADIAL ARTERY HARVEST;  Surgeon: Wonda Olds, MD;  Location: [place];  Service: Open Heart Surgery;  Laterality: Left;  . TEE WITHOUT CARDIOVERSION N/A 10/26/2019   Procedure: TRANSESOPHAGEAL ECHOCARDIOGRAM (TEE);  Surgeon: Wonda Olds, MD;  Location: [place];  Service: Open Heart Surgery;  Laterality: N/A;     Home Medications:  Prior to Admission medications   Medication Sig Start Date End Date Taking?  Authorizing Provider  acetaminophen (TYLENOL) 500 MG tablet Take 500-1,000 mg by mouth daily as needed for mild pain or fever.    Yes [provider]  amiodarone (PACERONE) 200 MG tablet Take 1 tablet (200 mg total) by mouth daily. 11/14/19  Yes Loel Dubonnet, NP  apixaban (ELIQUIS) 5 MG TABS tablet Take 1 tablet (5 mg total) by mouth 2 (two) times daily. 11/06/19  Yes Barrett, Erin R, PA-C  aspirin 81 MG chewable tablet Chew 1 tablet (81 mg total) by mouth daily. 10/20/19  Yes Nicole Kindred A, DO  atorvastatin (LIPITOR) 80 MG tablet Take 1 tablet (80 mg total) by mouth daily. Patient taking differently: Take 80 mg by mouth every evening.  10/21/19  Yes Minna Merritts, MD  ezetimibe (ZETIA) 10 MG tablet Take 1 tablet (10 mg total) by mouth daily. Patient taking differently: Take 10 mg by mouth daily in the afternoon.  10/20/19  Yes Minna Merritts,  MD  famotidine (PEPCID) 20 MG tablet Take 1 tablet (20 mg total) by mouth 2 (two) times daily. 12/26/17  Yes Paulette Blanch, MD  insulin aspart (NOVOLOG) 100 UNIT/ML injection Inject 0-15 Units into the skin 4 (four) times daily - after meals and at bedtime. Patient taking differently: Inject 0-20 Units into the skin 4 (four) times daily - after meals and at bedtime. Inject according to blood sugar readings:  < 60u: 0u and contact MD 120-160: 2u 161-200: 4u 201-250: 8u 251-300: 12u 301-350: 16u 351-450: contact MD 10/20/19  Yes Nicole Kindred A, DO  insulin aspart (NOVOLOG) 100 UNIT/ML injection Inject 10 Units into the skin 3 (three) times daily with meals. 10/20/19  Yes Nicole Kindred A, DO  insulin glargine (LANTUS) 100 UNIT/ML injection Inject 0.05 mLs (5 Units total) into the skin daily. 10/20/19  Yes Nicole Kindred A, DO  isosorbide dinitrate (ISORDIL) 20 MG tablet Take 1 tablet (20 mg total) by mouth 3 (three) times daily. For 1 month, then discontinue Patient taking differently: Take 20 mg by mouth 3 (three) times daily.  11/06/19  Yes  Barrett, Erin R, PA-C  levETIRAcetam (KEPPRA) 500 MG tablet Take 500 mg by mouth 3 (three) times daily. 10/03/19  Yes [provider]  metFORMIN (GLUCOPHAGE) 500 MG tablet Take 500 mg by mouth 2 (two) times daily with a meal.   Yes [provider]  Metoprolol Tartrate 37.5 MG TABS Take 37.5 mg by mouth 2 (two) times daily. 11/06/19  Yes Barrett, Erin R, PA-C  nitroGLYCERIN (NITROSTAT) 0.4 MG SL tablet Place 0.4 mg under the tongue every 5 (five) minutes as needed for chest pain.   Yes [provider]  oxyCODONE (OXY IR/ROXICODONE) 5 MG immediate release tablet Take 1-2 tablets (5-10 mg total) by mouth every 4 (four) hours as needed for severe pain. 11/06/19  Yes Barrett, Erin R, PA-C  pantoprazole (PROTONIX) 40 MG tablet Take 1 tablet (40 mg total) by mouth daily. 10/20/19  Yes Nicole Kindred A, DO  pregabalin (LYRICA) 50 MG capsule Take 1 capsule (50 mg total) by mouth 3 (three) times daily. 10/20/19 10/19/20 Yes Ezekiel Slocumb, DO  therapeutic multivitamin-minerals [facility]) tablet Take 1 tablet by mouth daily.   Yes [provider]  venlafaxine (EFFEXOR) 75 MG tablet Take 75 mg by mouth 3 (three) times daily. 05/11/19  Yes [provider]  furosemide (LASIX) 40 MG tablet Take 1 tablet (40 mg total) by mouth 2 (two) times daily for 5 days. 11/22/2019 11/28/19  Carrie Mew, MD    Inpatient Medications: Scheduled Meds: . amiodarone  200 mg Oral Daily  . apixaban  5 mg Oral BID  . aspirin  81 mg Oral Daily  . atorvastatin  80 mg Oral QPM  . ezetimibe  10 mg Oral Daily  . famotidine  20 mg Oral BID  . furosemide  40 mg Intravenous BID  . insulin aspart  0-15 Units Subcutaneous TID WC  . insulin aspart  0-5 Units Subcutaneous QHS  . insulin glargine  5 Units Subcutaneous Daily  . levETIRAcetam  500 mg Oral TID  . mouth rinse  15 mL Mouth Rinse BID  . metoprolol tartrate  37.5 mg Oral BID  . pantoprazole  40 mg Oral Daily  . pregabalin  50 mg  Oral TID  . venlafaxine  75 mg Oral TID   Continuous Infusions: . azithromycin 500 mg (12/01/2019 2144)  . cefTRIAXone (ROCEPHIN)  IV 2 g (11/28/2019 2110)  PRN Meds: acetaminophen **OR** acetaminophen, ondansetron **OR** ondansetron (ZOFRAN) IV, oxyCODONE  Allergies:    Allergies  Allergen Reactions  . Gabapentin   . Ace Inhibitors Cough    Other reaction(s): Other (See Comments) cough    Social History:   Social History   Socioeconomic History  . Marital status: Divorced    Spouse name: Not on file  . Number of children: Not on file  . Years of education: Not on file  . Highest education level: Not on file  Occupational History  . Not on file  Tobacco Use  . Smoking status: Former Smoker    Types: Cigarettes    Quit date: 2009    Years since quitting: 12.3  . Smokeless tobacco: Never Used  Substance and Sexual Activity  . Alcohol use: No    Alcohol/week: 0.0 standard drinks  . Drug use: No  . Sexual activity: Not on file  Other Topics Concern  . Not on file  Social History Narrative  . Not on file   Social Determinants of Health   Financial Resource Strain:   . Difficulty of Paying Living Expenses:   Food Insecurity:   . Worried About Charity fundraiser in the Last Year:   . Arboriculturist in the Last Year:   Transportation Needs:   . Film/video editor (Medical):   Marland Kitchen Lack of Transportation (Non-Medical):   Physical Activity:   . Days of Exercise per Week:   . Minutes of Exercise per Session:   Stress:   . Feeling of Stress :   Social Connections:   . Frequency of Communication with Friends and Family:   . Frequency of Social Gatherings with Friends and Family:   . Attends Religious Services:   . Active Member of Clubs or Organizations:   . Attends Archivist Meetings:   Marland Kitchen Marital Status:   Intimate Partner Violence:   . Fear of Current or Ex-Partner:   . Emotionally Abused:   Marland Kitchen Physically Abused:   . Sexually Abused:     Family  History:    Family History  Problem Relation Age of Onset  . Heart attack Mother 31  . Stroke Mother   . Heart attack Father   . Alzheimer's disease Father      ROS:  Please see the history of present illness.  Review of Systems  Unable to perform ROS: Mental acuity  Respiratory:       Unable to articulate if SOB/DOE  Cardiovascular: Negative for chest pain, palpitations and leg swelling.  Gastrointestinal:       Unable to articulate if signs of bleeding  Musculoskeletal: Positive for falls.       Unable to articulate if mechanical fall versus presyncope or LOC  Neurological:       R sided weakness noted on neuro exam as well as R sided facial droop. Unable to articulate if this is his baseline or if dizzy / presyncope / LOC.    All other ROS reviewed and negative.     Physical Exam/Data:   Vitals:   11/24/19 0100 11/24/19 0440 11/24/19 0500 11/24/19 0753  BP:  140/71  (!) 163/76  Pulse:  80  70  Resp: 16 16  19   Temp:  97.7 F (36.5 C)  97.6 F (36.4 C)  TempSrc:    Oral  SpO2: 93% 93%  97%  Weight:   86.8 kg   Height:  Intake/Output Summary (Last 24 hours) at 11/24/2019 0800 Last data filed at 11/24/2019 0400 Gross per 24 hour  Intake 350 ml  Output 850 ml  Net -500 ml   Last 3 Weights 11/24/2019 11/25/2019 11/14/2019  Weight (lbs) 191 lb 5.8 oz 198 lb 195 lb  Weight (kg) 86.8 kg 89.812 kg 88.451 kg     Body mass index is 29.97 kg/m.  General:  Well nourished, well developed, in no acute distress HEENT: normal. Scab noted across bridge of nose. Bruise noted on L side of forehead and R sided facial droop noted Neck: no JVD Vascular: No carotid bruits; radial pulses 2+ bilaterally Cardiac:  normal S1, S2; IRIR; no murmur. Midsternal incision with dry and intact dressing covering it (wound care team has reportedly been consulted).  L arm incision without signs of infection.  Lungs:  Poor inspiratory effort, reduced breath sounds bilaterally. Tachypnea  noted Abd: soft, nontender, no hepatomegaly  Ext: no lower extremity edema Musculoskeletal:  No deformities, BUE and BLE strength normal and left side stronger than that of right during brief neuro exam performed for noted R sided facial droop Skin: warm and dry  Neuro:  See above. Right sided facial droop and weakness. Unable to communicate well / AMS Psych:  See above, AMS  EKG:  The EKG was personally reviewed and demonstrates:  Afib, 87bpm, RAD, poor R wave progression, low voltage, poor lead placement lead I Telemetry:  Telemetry was personally reviewed and demonstrates:  Afib with frequent PVCs and at times trigeminy / bigeminy, rate 70s-80s  Relevant CV Studies: Echo 10/20/19 1. Left ventricular ejection fraction, by estimation, is 55%. The left  ventricle has low normal function. Grossly, no significant wall motion  abnormality. There is mild left ventricular hypertrophy of the septal  segment. Grade II diastolic dysfunction.  2. Right ventricular systolic function is normal. The right ventricular  size is normal. Tricuspid regurgitation signal is inadequate for assessing  PA pressure.  3. Left atrial size was moderately dilated.   Cardiac cath 10/2019 [facility] 10/19/2019: Coronary dominance: Right  Left mainstem: Large vessel that bifurcates into the LAD and left circumflex, no significant disease noted, unable to exclude mild distal left main disease, appears hazy  Left anterior descending (LAD): Large vessel that extends to the apical region, diagonal branch 2 of moderate size, severe proximal LAD disease at the takeoff of the diagonal estimated 95%, mild distal LAD disease  Left circumflex (LCx): Large vessel with OM branch 2, proximal to mid left circumflex is occluded with in-stent restenosis OM seen distally, fills via collaterals High ramus with severe ostial disease estimated 90%  Right coronary artery (RCA): Right dominant vessel with PL and PDA, severe in-stent  restenosis of mid vessel stent, severe ostial PDA disease  Left ventriculography: Left ventricular systolic function is low normal, LVEF is estimated at 50%, there is no significant mitral regurgitation , no significant aortic valve stenosis Mild inferior wall hypokinesis  Final Conclusions:  Severe three-vessel coronary disease including Severe proximal LAD Severe mid RCA, in-stent restenosis , distal RCA Occluded proximal to mid left circumflex in-stent occlusion Severe ostial ramus disease Mild inferior wall hypokinesis  Recommendations:  Case discussed with interventional cardiology Given he has not been responding well to stenting as noted by occluded left circumflex stent, in-stent restenosis on the right in the setting of diabetes, would likely be a good candidate for CABG Given the severity of his symptoms and disease, will transfer to [facility] for further discussion and  evaluation    Laboratory Data:  High Sensitivity Troponin:   Recent Labs  Lab 11/21/2019 1208 12/05/2019 1405  TROPONINIHS 10 10     Cardiac EnzymesNo results for input(s): TROPONINI in the last 168 hours. No results for input(s): TROPIPOC in the last 168 hours.  Chemistry Recent Labs  Lab 11/28/2019 1208 11/24/19 0015  NA 139 140  K 4.5 3.7  CL 103 101  CO2 25 29  GLUCOSE 180* 259*  BUN 34* 34*  CREATININE 1.72* 1.60*  CALCIUM 8.9 8.6*  GFRNONAA 42* 46*  GFRAA 49* 53*  ANIONGAP 11 10    Recent Labs  Lab 11/28/2019 1208 11/24/19 0015  PROT 6.9 6.7  ALBUMIN 3.1* 2.8*  AST 19 16  ALT 13 12  ALKPHOS 100 97  BILITOT 1.0 0.7   Hematology Recent Labs  Lab 11/15/2019 1208 11/24/19 0015  WBC 8.8 7.9  RBC 3.30* 3.18*  HGB 9.9* 9.6*  HCT 32.0* 31.1*  MCV 97.0 97.8  MCH 30.0 30.2  MCHC 30.9 30.9  RDW 19.5* 19.6*  PLT 215 205   BNP Recent Labs  Lab 11/24/2019 1208  BNP 273.0*    DDimer No results for input(s): DDIMER in the last 168 hours.   Radiology/Studies:  DG Chest 2  View  Result Date: 12/02/2019 CLINICAL DATA:  Shortness of breath, fall EXAM: CHEST - 2 VIEW COMPARISON:  11/13/2019 FINDINGS: Prior CABG. Stable cardiomegaly. Diffusely increased interstitial markings throughout both lungs with patchy scattered patchy opacities in the bilateral lung bases and peripheral aspect of the right upper lobe. No large pleural fluid collection. No pneumothorax. Degenerative changes of the thoracic spine suggesting DISH. IMPRESSION: Findings suggestive of congestive heart failure with pulmonary edema. A superimposed infectious process would be difficult to exclude. Electronically Signed   By: Davina Poke D.O.   On: 11/15/2019 12:37   CT HEAD WO CONTRAST  Result Date: 11/17/2019 CLINICAL DATA:  Fall. EXAM: CT HEAD WITHOUT CONTRAST TECHNIQUE: Contiguous axial images were obtained from the base of the skull through the vertex without intravenous contrast. COMPARISON:  MR brain and CT head dated April 01, 2019. FINDINGS: Brain: No evidence of acute infarction, hemorrhage, hydrocephalus, extra-axial collection or mass lesion/mass effect. Stable mild atrophy and chronic microvascular ischemic changes. Small remote lacunar infarct in the right cerebellum again noted. Vascular: Calcified atherosclerosis at the skullbase. No hyperdense vessel. Skull: Normal. Negative for fracture or focal lesion. Sinuses/Orbits: No acute finding. Other: None. IMPRESSION: No acute intracranial abnormality. Electronically Signed   By: Titus Dubin M.D.   On: 11/14/2019 12:47    Assessment and Plan:   Respiratory distress Hypoxia --Presented s/p fall and with AMS and with hypoxia. Unclear etiology of fall as patient is poor historian and as below. When last seen in the clinic, he was asx in Afib and felt to be euvolemic on exam. Considered is pulmonary infectious process, volume overload, amiodarone lung, PE, OHS, and known COPD on 3L [place] oxygen at home. COVID-19 negative. Presented to ED with  hypoxia and started on IV diuresis and BiPAP with some improvement in his status and renal function.   Acute on chronic HFpEF --Due to AMS, difficult to assess current sx or confirm medication compliance. Not on a PTA diuretic and felt to be euvolemic on exam when last in clinic. Admission BNP 273.0 (though with consideration of obesity) and CXR consistent with edema. Hypoxic on exam with [place] oxygen increased from baseline to 5-6L (baseline 3L [place] oxygen). Started on IV lasix  with initial worsening mental status but improvement in status with BiPAP.  --Continue IV diuresis as renal function allows and until euvolemic on exam. Continue to monitor I/Os, daily standing weights. Renal function improving with Cr 1.72  1.60 and BUN stable. Net -37cc yesterday and -1.2L for the admission. Wt decrease at 89.8kg  86.8kg. Recommend replete K as needed with repletion of K+ ordered this AM as below. ACE/ARB should be restarted for additional BP control once renal function allows with patient approaching baseline.   Fall of unknown etiology R sided Facial Droop, aphasia, R sided weakness --Unclear if fall 2/2 presyncope, LOC, or mechanical fall. Current mental status precludes accurate HPI. PE and embolic stroke less likely if compliant with Eliquis. Consider also hemorrhagic stroke. Initial CT head negative for acute findings. Pending MRI head after R sided facial droop and weakness noted with aphasia earlier this AM. Also considered is arrhythmia though telemetry so far only demonstrates known Afib and frequent PVCs. If no additional arrhythmia caught on EKG or telemetry, consider Zio at discharge. Consider also his history of CAD / recent CABG with last clinic visit noting serious drainage of wound and possible concern for infection.   CAD s/p CABG --Unable to assess sx 2/2 AMS. EKG without acute ST/T changes. HS Tn not consistent with ACS. Continue GDMT. Adjust BB as needed for HR and BP control and addition of  ACE/ARB recommended once renal function allows. Midsternal incision showed some serious drainage in clinic and followed by wound care due to concern for infection. Continue statin for risk factor modification.   HTN --BP currently suboptimal and recommend titration of BB and addition of ACE/ARB once renal function stable at baseline. Consider also addition of CCB or hydralazine at this time for additional BP support as renal function recovers.  PAF --Known Afib with rate well controlled and reportedly asx in Afib.  --Continue BB for rate control.  --Consider that amiodarone lung could also contribute to his current respiratory status. Recently, amiodarone reduced to 280m daily. Monitor lung, liver, and thyroid function.  --Continue Eliquis 561mBID for CHA2DS2VASc score of at least  3. Due to AMS, unclear if compliance or any s/sx of bleeding. As above, pending head MRI. Recommend daily CBC.   HLD --Continue statin and Zetia. Due for repeat lipid and liver function in 6-8 weeks.   PAD --Continue ASA, statin, and Zetia.  Anemia --Hgb low with baseline Hgb also low. Unable to assess if s/sx of bleeding leading up to admission due to AMS. Consider anemia of chronic dz. Per IM.  CKD --Daily BMET.  Hypokalemia --Replete with goal 4.0 on IV lasix. Ordered KCl supplementation for today.   For questions or updates, please contact [facility] consult [email or web address] for contact info under     Signed, JaArvil ChacoPA-C  11/24/2019 8:00 AM

## 2019-11-24 NOTE — Progress Notes (Signed)
PT Cancellation Note  Patient Details Name: Larry Terrell MRN: [id number] DOB: June 07, 1961   Cancelled Treatment:    Reason Eval/Treat Not Completed: Other (comment)(Pt attempted twice this AM, first attempt pt just received breakfast, second attempt out of room for imaging. PT to follow up as able.)   Lieutenant Diego PT, DPT 10:57 AM,11/24/19

## 2019-11-24 NOTE — Progress Notes (Signed)
SLP Cancellation Note  Patient Details Name: Hilton Saephan MRN: [id number] DOB: 08/08/1960   Cancelled treatment:       Reason Eval/Treat Not Completed: Medical issues which prohibited therapy;Patient not medically ready(chart reviewed). Pt admitted yesterday PM w/ Acute metabolic encephalopathy suspect related to hypoxia and infection per MD note. Pt is not tolerating CPAP this morning; placed on [place] O2 support at 4L. Per report, patient was at [facility] when he fell, hit his head, then was brought to the ER.  ST services will f/u w/ pt tomorrow to determine any Cognitive-linguistic needs (noted pt seen previously and recommended Cognitive-linguistic services at d/c as needed) d/t current Acuity of presentation. Noted pt is verbally engaging w/ others; recommend reducing distractions during communications.     Orinda Kenner, [place], CCC-SLP Roxanna Mcever 11/24/2019, 12:37 PM

## 2019-11-24 NOTE — Progress Notes (Signed)
Pt. Not tolerating CPAP with desaturations to low 80's.  Pt. Placed back on O2 via Nasal Cannula at 4L with O2 SAT 94-96%. Pt. Tolerating well. No distress or shortness of breath noted. Will continue to monitor.

## 2019-11-25 ENCOUNTER — Inpatient Hospital Stay: Payer: Medicare HMO

## 2019-11-25 DIAGNOSIS — Z951 Presence of aortocoronary bypass graft: Secondary | ICD-10-CM

## 2019-11-25 DIAGNOSIS — R410 Disorientation, unspecified: Secondary | ICD-10-CM

## 2019-11-25 LAB — CBC
HCT: 31.4 % — ABNORMAL LOW (ref 39.0–52.0)
Hemoglobin: 10.1 g/dL — ABNORMAL LOW (ref 13.0–17.0)
MCH: 30.2 pg (ref 26.0–34.0)
MCHC: 32.2 g/dL (ref 30.0–36.0)
MCV: 94 fL (ref 80.0–100.0)
Platelets: 193 10*3/uL (ref 150–400)
RBC: 3.34 MIL/uL — ABNORMAL LOW (ref 4.22–5.81)
RDW: 19.3 % — ABNORMAL HIGH (ref 11.5–15.5)
WBC: 8.2 10*3/uL (ref 4.0–10.5)
nRBC: 0 % (ref 0.0–0.2)

## 2019-11-25 LAB — LIPID PANEL
Cholesterol: 49 mg/dL (ref 0–200)
HDL: 22 mg/dL — ABNORMAL LOW (ref >40)
LDL Cholesterol: 13 mg/dL (ref 0–99)
Total CHOL/HDL Ratio: 2.2 RATIO
Triglycerides: 69 mg/dL (ref <150)
VLDL: 14 mg/dL (ref 0–40)

## 2019-11-25 LAB — BASIC METABOLIC PANEL
Anion gap: 8 (ref 5–15)
BUN: 27 mg/dL — ABNORMAL HIGH (ref 8–23)
CO2: 31 mmol/L (ref 22–32)
Calcium: 8.7 mg/dL — ABNORMAL LOW (ref 8.9–10.3)
Chloride: 102 mmol/L (ref 98–111)
Creatinine, Ser: 1.29 mg/dL — ABNORMAL HIGH (ref 0.61–1.24)
GFR calc Af Amer: >60 mL/min (ref >60)
GFR calc non Af Amer: 59 mL/min — ABNORMAL LOW (ref >60)
Glucose, Bld: 212 mg/dL — ABNORMAL HIGH (ref 70–99)
Potassium: 3.9 mmol/L (ref 3.5–5.1)
Sodium: 141 mmol/L (ref 135–145)

## 2019-11-25 LAB — HEMOGLOBIN A1C
Hgb A1c MFr Bld: 5.2 % (ref 4.8–5.6)
Mean Plasma Glucose: 102.54 mg/dL

## 2019-11-25 LAB — GLUCOSE, CAPILLARY
Glucose-Capillary: 221 mg/dL — ABNORMAL HIGH (ref 70–99)
Glucose-Capillary: 243 mg/dL — ABNORMAL HIGH (ref 70–99)
Glucose-Capillary: 168 mg/dL — ABNORMAL HIGH (ref 70–99)
Glucose-Capillary: 180 mg/dL — ABNORMAL HIGH (ref 70–99)

## 2019-11-25 NOTE — Evaluation (Addendum)
Speech Language Pathology Evaluation Patient Details Name: Larry Terrell MRN: [id number] DOB: [birth year] Today's Date: 11/25/2019 Time: 0920-1005 SLP Time Calculation (min) (ACUTE ONLY): 45 min  Problem List:  Patient Active Problem List   Diagnosis Date Noted  . Acute on chronic combined systolic and diastolic CHF (congestive heart failure) ([place]) 11/20/2019  . Open harvest of left radial artery 10/27/2019  . S/P CABG x 5   . CAD in native artery 10/20/2019  . Unstable angina ([place]) 10/20/2019  . (HFpEF) heart failure with preserved ejection fraction ([place]) 10/20/2019  . Essential hypertension 10/20/2019  . Hyperlipidemia LDL goal <70 10/20/2019  . PAD (peripheral artery disease) ([place]) 10/20/2019  . Chest pain 10/18/2019  . Altered mental status 06/04/2019  . Sepsis ([place]) 06/04/2019  . AKI (acute kidney injury) ([place]) 06/04/2019  . GERD (gastroesophageal reflux disease) 06/04/2019  . History of seizure 06/04/2019  . Stroke ([place]) 04/01/2019  . Acute appendicitis 07/10/2015  . S/P laparoscopic appendectomy 07/10/2015  . Appendicitis, acute   . Diabetes mellitus with complication ([place])   . Chronic congestive heart failure with left ventricular diastolic dysfunction ([place])   . Lactic acidosis   . Acute respiratory failure with hypoxia [facility])    Past Medical History:  Past Medical History:  Diagnosis Date  . CHF (congestive heart failure) (HCC)    diastolic (echo at [facility] 8144) EF 55-60%  . Cirrhosis ([place])    NASH per records  . COPD (chronic obstructive pulmonary disease) ([place])   . Depression   . Diabetes mellitus with neuropathy ([place])   . Heart attack ([place])   . Neuropathy   . OSA (obstructive sleep apnea)   . Peripheral vascular disease due to secondary diabetes mellitus ([place])   . Splenomegaly   . Temporal giant cell arteritis ([place])   . TIA (transient ischemic attack) April 2016   Past Surgical History:  Past Surgical History:  Procedure Laterality Date  . AORTA - FEMORAL  ARTERY BYPASS GRAFT Bilateral   . CARDIAC CATHETERIZATION    . cardiac stents  July 2015   . CORONARY ARTERY BYPASS GRAFT N/A 10/26/2019   Procedure: CORONARY ARTERY BYPASS GRAFTING (CABG) x 5, Using Bilateral mammary arteries, and right leg greater saphenous vein harvested endoscopically;  Surgeon: Wonda Olds, MD;  Location: [place];  Service: Open Heart Surgery;  Laterality: N/A;  . CORONARY STENT INTERVENTION N/A 08/09/2017   Procedure: CORONARY STENT INTERVENTION;  Surgeon: Wellington Hampshire, MD;  Location: [place] [facility];  Service: Cardiovascular;  Laterality: N/A;  . LAPAROSCOPIC APPENDECTOMY N/A 07/10/2015   Procedure: APPENDECTOMY LAPAROSCOPIC;  Surgeon: Rolm Bookbinder, MD;  Location: [place];  Service: General;  Laterality: N/A;  . LEFT HEART CATH AND CORONARY ANGIOGRAPHY N/A 08/09/2017   Procedure: LEFT HEART CATH AND CORONARY ANGIOGRAPHY;  Surgeon: Wellington Hampshire, MD;  Location: [place] [facility];  Service: Cardiovascular;  Laterality: N/A;  . LEFT HEART CATH AND CORONARY ANGIOGRAPHY N/A 10/19/2019   Procedure: LEFT HEART CATH AND CORONARY ANGIOGRAPHY;  Surgeon: Minna Merritts, MD;  Location: [place] [facility];  Service: Cardiovascular;  Laterality: N/A;  . RADIAL ARTERY HARVEST Left 10/26/2019   Procedure: RADIAL ARTERY HARVEST;  Surgeon: Wonda Olds, MD;  Location: [place];  Service: Open Heart Surgery;  Laterality: Left;  . TEE WITHOUT CARDIOVERSION N/A 10/26/2019   Procedure: TRANSESOPHAGEAL ECHOCARDIOGRAM (TEE);  Surgeon: Wonda Olds, MD;  Location: [place];  Service: Open Heart Surgery;  Laterality: N/A;   HPI:  Pt is a 59 y.o. male hx of NASH, CAD s/p RCA and LCx stenting, chronic diastolic heart failure, PAD s/p aortobifemoral bypass in 2003 with known stenosis of the bilateral SFAs, TIA, DM2 with diabetic polyneuropathy, HTN, HLD, seizure disorder, chronic back pain, prior tobacco use, and CABGx5 [facility] in 4/2021s/p severe multi-vessel CAD. Pt  presented to the ED s/p fall, and confusion placed on bipap in ED. MRI + for Acute left PCA territory infarct, Punctate focus of restricted diffusion in the left corona radiata, consistent with acute infarct. 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    Assessment / Plan / Recommendation Clinical Impression  Pt presents w/ grossly functional oropharyngeal phase swallow tolerating trials of soft, cut foods and thin liquids via straw w/ no overt clinical s/s of aspiration noted during the informal, bedside Cognitive-linguistic assessment. (Pt was finishing his breakfast meal. He was recommended to be on a [facility] SOFT DIET d/t missing Dentition last admission at [facility].)  During informal Cognitive-linguistic assessment, pt exhibited deficits inpacting Cognition and Awareness, receptive and expressive language/communication, and Dysarthria. Per chart/family notes and previous assessments, pt has a Baseline of Memory Deficits, and problems w/ Orientation and Recall of new information. Current deficits appear to impact pt's verbal communication w/ others to indicate wants/needs beyond basic needs. Pt demonstrated deficits in problem solving, awareness of situation, safety/judgement and reduced verbal skills to answer more complex y/n questions and identify object function. Pt was unable to recall how to use the Nurse Call [place] for assistance despite 3 practice trials and explanation. Suspect long term carry-over of information is a challenge. Pt would benefit from skilled ST services for ongoing formal assessment and treatment targeting Cognitive-linguistic skills and abilities to increase pt's functional communication in ADLs and reduce caregiver burden.     SLP Assessment  SLP Recommendation/Assessment: All further Speech Lanaguage Pathology  needs can be addressed in the next venue of care SLP Visit Diagnosis: Cognitive communication deficit (R41.841)    Follow Up Recommendations  Skilled  Nursing facility(vs CIR)    Frequency and Duration (n/a)  (n/a)      SLP Evaluation Cognition  Overall Cognitive Status: History of cognitive impairments - at baseline Arousal/Alertness: Awake/alert Orientation Level: Oriented to person(he lived in [place] w/ brother/cousin) Attention: Focused;Sustained Focused Attention: Impaired Focused Attention Impairment: Verbal basic;Functional basic Sustained Attention: Impaired Sustained Attention Impairment: Verbal basic;Functional basic Memory: Impaired(baseline) Awareness: Impaired Awareness Impairment: (unknown baseline) Problem Solving: Impaired Problem Solving Impairment: Verbal basic;Functional basic Executive Function: Reasoning;Sequencing;Decision Making Reasoning: Impaired Sequencing: Impaired Decision Making: Impaired Safety/Judgment: Impaired Comments: unsure of baseline       Comprehension  Auditory Comprehension Overall Auditory Comprehension: Impaired Yes/No Questions: Impaired(complex) Commands: Impaired(2 step) Conversation: Complex Other Conversation Comments: delay Interfering Components: Attention;Processing speed;Working Field seismologist: Lawyer: Not tested Reading Comprehension Reading Status: Not tested    Expression Expression Primary Mode of Expression: Verbal Verbal Expression Overall Verbal Expression: Impaired(for complex tasks) Initiation: (hesitation to enage) Automatic Speech: Name;Social Response;Counting;Day of week(WFL) Level of Generative/Spontaneous Verbalization: Word Repetition: No impairment(adequate`) Naming: No impairment(adequate) Interfering Components: Attention;Speech intelligibility Effective Techniques: (cues) Non-Verbal Means of Communication: Not applicable Written Expression Dominant Hand: Right Written Expression: Not tested   Oral / Motor  Oral Motor/Sensory  Function Overall Oral Motor/Sensory Function: Generalized oral weakness(grossly WFL) Facial Symmetry: Within Functional Limits Lingual Symmetry: Within Functional Limits Lingual Strength: Within Functional Limits Motor Speech Overall Motor Speech: Impaired Respiration: Within functional limits Phonation: Normal Articulation: Impaired  Level of Impairment: Phrase Intelligibility: Intelligibility reduced Effective Techniques: Slow rate;Increased vocal intensity;Over-articulate   GO                      Orinda Kenner, MS, CCC-SLP Larry Terrell 11/25/2019, 10:52 AM

## 2019-11-25 NOTE — Progress Notes (Addendum)
Progress Note  Patient Name: Larry Terrell Date of Encounter: 11/25/2019  Primary Cardiologist: Ida Rogue, MD   Subjective   No complaints Denies shortness of breath or chest discomfort Isolated low oxygen measurement overnight otherwise maintaining in the mid 90s -1.7 L past 24 hours, -2.2 L this admission  Inpatient Medications    Scheduled Meds: .  stroke: mapping our early stages of recovery book   Does not apply Once  . amiodarone  200 mg Oral Daily  . apixaban  5 mg Oral BID  . aspirin  81 mg Oral Daily  . atorvastatin  80 mg Oral QPM  . ezetimibe  10 mg Oral Daily  . famotidine  20 mg Oral BID  . furosemide  80 mg Intravenous BID  . insulin aspart  0-15 Units Subcutaneous TID WC  . insulin aspart  0-5 Units Subcutaneous QHS  . insulin glargine  5 Units Subcutaneous Daily  . levETIRAcetam  500 mg Oral TID  . mouth rinse  15 mL Mouth Rinse BID  . metoprolol tartrate  37.5 mg Oral BID  . pantoprazole  40 mg Oral Daily  . pregabalin  50 mg Oral TID  . venlafaxine  75 mg Oral TID   Continuous Infusions:  PRN Meds: acetaminophen **OR** acetaminophen, ondansetron **OR** ondansetron (ZOFRAN) IV   Vital Signs    Vitals:   11/25/19 1015 11/25/19 1017 11/25/19 1019 11/25/19 1214  BP: 109/88 123/82 136/79 (!) 142/93  Pulse: 64 84 68 68  Resp:    18  Temp:    98.4 F (36.9 C)  TempSrc:    Oral  SpO2: 92% 94% 96% 94%  Weight:      Height:        Intake/Output Summary (Last 24 hours) at 11/25/2019 1340 Last data filed at 11/25/2019 0700 Gross per 24 hour  Intake 240 ml  Output 1550 ml  Net -1310 ml   Last 3 Weights 11/25/2019 11/24/2019 11/27/2019  Weight (lbs) 189 lb 191 lb 5.8 oz 198 lb  Weight (kg) 85.73 kg 86.8 kg 89.812 kg      Telemetry    Atrial fibrillation- Personally Reviewed  ECG     - Personally Reviewed  Physical Exam   GEN: No acute distress.   Neck: No JVD Cardiac:  Irregularly irregular, no murmurs, rubs, or gallops.    Respiratory: Clear to auscultation bilaterally. GI: Soft, nontender, non-distended  MS: No edema; No deformity. Neuro:  Nonfocal  Psych: Normal affect   Labs    High Sensitivity Troponin:   Recent Labs  Lab 12/02/2019 1208 12/05/2019 1405  TROPONINIHS 10 10      Chemistry Recent Labs  Lab 12/04/2019 1208 11/24/19 0015 11/25/19 0520  NA 139 140 141  K 4.5 3.7 3.9  CL 103 101 102  CO2 25 29 31   GLUCOSE 180* 259* 212*  BUN 34* 34* 27*  CREATININE 1.72* 1.60* 1.29*  CALCIUM 8.9 8.6* 8.7*  PROT 6.9 6.7  --   ALBUMIN 3.1* 2.8*  --   AST 19 16  --   ALT 13 12  --   ALKPHOS 100 97  --   BILITOT 1.0 0.7  --   GFRNONAA 42* 46* 59*  GFRAA 49* 53* >60  ANIONGAP 11 10 8      Hematology Recent Labs  Lab 11/28/2019 1208 11/24/19 0015 11/25/19 0520  WBC 8.8 7.9 8.2  RBC 3.30* 3.18* 3.34*  HGB 9.9* 9.6* 10.1*  HCT 32.0* 31.1* 31.4*  MCV 97.0 97.8 94.0  MCH 30.0 30.2 30.2  MCHC 30.9 30.9 32.2  RDW 19.5* 19.6* 19.3*  PLT 215 205 193    BNP Recent Labs  Lab 11/19/2019 1208  BNP 273.0*     DDimer No results for input(s): DDIMER in the last 168 hours.   Radiology    EEG  Result Date: 11/24/2019 Alexis Goodell, MD     11/24/2019  4:44 PM ELECTROENCEPHALOGRAM REPORT Patient: Prince Couey       Room #: 249A-AA EEG No. ID: [id number] Age: 59 y.o.        Sex: male Requesting Physician: Danford Report Date:  11/24/2019       Interpreting Physician: Alexis Goodell History: Sehaj Mcenroe is an 59 y.o. male with altered mental status Medications: Amiodarone, Eliquis, ASA, Insulin, Lasix, Keppra, Lopressor, Lyrica, Effexor, Zetia, Lipitor Conditions of Recording:  This is a 21 channel routine scalp EEG performed with bipolar and monopolar montages arranged in accordance to the international 10/20 system of electrode placement. One channel was dedicated to EKG recording. The patient is in the awake state. Description:  The background activity is slow and poorly organized.  It consists  of low voltage activity in the delta-theta continuum.  This activity is diffusely distributed and continuous throughout the recording.  No epileptiform activity is noted.  Hyperventilation was not performed. Intermittent photic stimulation was performed but failed to illicit any change in the tracing. IMPRESSION: This is an abnormal EEG secondary to general background slowing.  This finding may be seen with a diffuse disturbance that is etiologically nonspecific, but may include a metabolic encephalopathy, among other possibilities.  No epileptiform activity was noted.  Alexis Goodell, MD Neurology [phone] 11/24/2019, 4:41 PM   MR BRAIN WO CONTRAST  Result Date: 11/24/2019 CLINICAL DATA:  Focal neurological deficit. Stroke suspected. EXAM: MRI HEAD WITHOUT CONTRAST TECHNIQUE: Multiplanar, multiecho pulse sequences of the brain and surrounding structures were obtained without intravenous contrast. COMPARISON:  Head CT Nov 23, 2019 FINDINGS: Brain: Area of restricted diffusion involving the splenium of the corpus callosum on the left side, posterior aspect of the left fornix, lateral geniculate body, left occipital lobe and medial temporal lobe, including the body of the left hippocampus. Findings are consistent with a left PCA territory infarct. A single focus of restricted diffusion is also seen in the left corona radiata. Remote lacunar infarcts are seen in the right cerebellar hemisphere. Scattered foci of T2 hyperintensity are seen within the white matter of cerebral hemispheres and within the pons, nonspecific, most likely related to chronic small vessel ischemia. More pronounced than expected for age. Mild prominence of the ventricular system and cerebellar sulci reflecting parenchymal volume loss. There is no hemorrhage, hydrocephalus, extra-axial collection or mass lesion. Vascular: Diminutive caliber of the horizontal petrous segment of the left ICA. Remainder of the major arterial flow voids are  preserved. Skull and upper cervical spine: Normal marrow signal. Sinuses/Orbits: Left lens surgery. Other: None. IMPRESSION: 1. Acute left PCA territory infarct. 2. Punctate focus of restricted diffusion in the left corona radiata, consistent with acute infarct. 3. Diminutive caliber of the horizontal petrous segment of the left ICA. 4. 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 5. Moderate chronic small vessel ischemic changes. More pronounced than expected for age. These results were called by telephone at the time of interpretation on 11/24/2019 at 12:13 pm to provider [facility] , who verbally acknowledged these results. Electronically Signed   By: Pedro Earls  M.D.   On: 11/24/2019 12:19   US Carotid Bilateral (at [facility] and [facility] only)  Result Date: 11/24/2019 CLINICAL DATA:  59 year old male with stroke-like symptoms EXAM: BILATERAL CAROTID DUPLEX ULTRASOUND TECHNIQUE: Pearline Cables scale imaging, color Doppler and duplex ultrasound were performed of bilateral carotid and vertebral arteries in the neck. COMPARISON:  Prior duplex carotid ultrasound 11/06/2014 FINDINGS: Criteria: Quantification of carotid stenosis is based on velocity parameters that correlate the residual internal carotid diameter with NASCET-based stenosis levels, using the diameter of the distal internal carotid lumen as the denominator for stenosis measurement. The following velocity measurements were obtained: RIGHT ICA: 70/28 cm/sec CCA: 14/48 cm/sec SYSTOLIC ICA/CCA RATIO:  0.8 ECA:  142 cm/sec LEFT ICA: 53/23 cm/sec CCA: 185/63 cm/sec SYSTOLIC ICA/CCA RATIO:  0.5 ECA:  188 cm/sec RIGHT CAROTID ARTERY: Mild heterogeneous atherosclerotic plaque in the proximal internal carotid artery. By peak systolic velocity criteria, the estimated stenosis is less than 50%. RIGHT VERTEBRAL ARTERY:  Patent with antegrade flow. LEFT CAROTID ARTERY: Mild heterogeneous atherosclerotic plaque in the proximal internal carotid  artery. By peak systolic velocity criteria, the estimated stenosis is less than 50%. LEFT VERTEBRAL ARTERY:  Patent with antegrade flow. IMPRESSION: 1. Mild (1-49%) stenosis proximal right internal carotid artery secondary to mild heterogeneous atherosclerotic plaque. 2. Mild (1-49%) stenosis proximal left internal carotid artery secondary to mild heterogeneous atherosclerotic plaque. 3. The vertebral arteries are patent with normal antegrade flow. Signed, Criselda Peaches, MD, [facility] Radiology Electronically Signed   By: Jacqulynn Cadet M.D.   On: 11/24/2019 15:37   ECHOCARDIOGRAM COMPLETE  Result Date: 11/24/2019    ECHOCARDIOGRAM REPORT   Patient Name:   [facility] LEE Selmon Date of Exam: 11/24/2019 Medical Rec #:  [id number]       Height:       67.0 in Accession #:    [id number]      Weight:       191.4 lb Date of Birth:  [birth year]        BSA:          1.985 m Patient Age:    59 years        BP:           152/75 mmHg Patient Gender: M               HR:           67 bpm. Exam Location:  [facility] Procedure: 2D Echo, Cardiac Doppler and Color Doppler Indications:     Stroke 434.91  History:         Patient has prior history of Echocardiogram examinations, most                  recent 10/26/2019. TIA and COPD; Risk Factors:Diabetes and Sleep                  Apnea.  Sonographer:     Sherrie Sport RDCS (AE) Referring Phys:  [id number] Suann Larry DANFORD Diagnosing Phys: Ida Rogue MD  Sonographer Comments: Suboptimal apical window. IMPRESSIONS  1. Left ventricular ejection fraction, by estimation, is 55 to 60%. The left ventricle has normal function. The left ventricle has no regional wall motion abnormalities. There is mild left ventricular hypertrophy. Left ventricular diastolic parameters are indeterminate.  2. Right ventricular systolic function is normal. The right ventricular size is normal. There is normal pulmonary artery systolic pressure.  3. Left  atrial size was moderately dilated.  4. Mild mitral  valve regurgitation.  5. Images suggestive of left pleural effusion FINDINGS  Left Ventricle: Left ventricular ejection fraction, by estimation, is 55 to 60%. The left ventricle has normal function. The left ventricle has no regional wall motion abnormalities. The left ventricular internal cavity size was normal in size. There is  mild left ventricular hypertrophy. Left ventricular diastolic parameters are indeterminate. Right Ventricle: The right ventricular size is normal. No increase in right ventricular wall thickness. Right ventricular systolic function is normal. There is normal pulmonary artery systolic pressure. The tricuspid regurgitant velocity is 2.44 m/s, and  with an assumed right atrial pressure of 10 mmHg, the estimated right ventricular systolic pressure is 33.8 mmHg. Left Atrium: Left atrial size was moderately dilated. Right Atrium: Right atrial size was normal in size. Pericardium: A small pericardial effusion is present. Mitral Valve: The mitral valve is normal in structure. Normal mobility of the mitral valve leaflets. Mild mitral valve regurgitation. No evidence of mitral valve stenosis. Tricuspid Valve: The tricuspid valve is normal in structure. Tricuspid valve regurgitation is not demonstrated. No evidence of tricuspid stenosis. Aortic Valve: The aortic valve is normal in structure. Aortic valve regurgitation is not visualized. No aortic stenosis is present. Aortic valve mean gradient measures 5.7 mmHg. Aortic valve peak gradient measures 11.4 mmHg. Aortic valve area, by VTI measures 1.68 cm. Pulmonic Valve: The pulmonic valve was normal in structure. Pulmonic valve regurgitation is not visualized. No evidence of pulmonic stenosis. Aorta: The aortic root is normal in size and structure. Venous: The inferior vena cava is normal in size with greater than 50% respiratory variability, suggesting right atrial pressure of 3 mmHg. IAS/Shunts: No  atrial level shunt detected by color flow Doppler.  LEFT VENTRICLE PLAX 2D LVIDd:         3.38 cm LVIDs:         2.28 cm LV PW:         1.19 cm LV IVS:        0.97 cm LVOT diam:     2.00 cm LV SV:         50 LV SV Index:   25 LVOT Area:     3.14 cm  RIGHT VENTRICLE RV Basal diam:  3.36 cm RV S prime:     10.00 cm/s TAPSE (M-mode): 3.2 cm LEFT ATRIUM             Index       RIGHT ATRIUM           Index LA diam:        5.30 cm 2.67 cm/m  RA Area:     21.10 cm LA Vol (A2C):   77.4 ml 39.00 ml/m RA Volume:   61.70 ml  31.09 ml/m LA Vol (A4C):   82.1 ml 41.37 ml/m LA Biplane Vol: 80.1 ml 40.36 ml/m  AORTIC VALVE                    PULMONIC VALVE AV Area (Vmax):    1.88 cm     PV Vmax:        0.69 m/s AV Area (Vmean):   1.84 cm     PV Peak grad:   1.9 mmHg AV Area (VTI):     1.68 cm     RVOT Peak grad: 2 mmHg AV Vmax:           168.67 cm/s AV Vmean:          111.333 cm/s AV VTI:  0.299 m AV Peak Grad:      11.4 mmHg AV Mean Grad:      5.7 mmHg LVOT Vmax:         101.00 cm/s LVOT Vmean:        65.200 cm/s LVOT VTI:          0.160 m LVOT/AV VTI ratio: 0.53  AORTA Ao Root diam: 2.90 cm MITRAL VALVE                TRICUSPID VALVE MV Area (PHT): 4.77 cm     TR Peak grad:   23.8 mmHg MV Decel Time: 159 msec     TR Vmax:        244.00 cm/s MV E velocity: 131.00 cm/s                             SHUNTS                             Systemic VTI:  0.16 m                             Systemic Diam: 2.00 cm Ida Rogue MD Electronically signed by Ida Rogue MD Signature Date/Time: 11/24/2019/2:50:13 PM    Final     Cardiac Studies   Echocardiogram 1. Left ventricular ejection fraction, by estimation, is 55 to 60%. The  left ventricle has normal function. The left ventricle has no regional  wall motion abnormalities. There is mild left ventricular hypertrophy.  Left ventricular diastolic parameters  are indeterminate.  2. Right ventricular systolic function is normal. The right ventricular  size  is normal. There is normal pulmonary artery systolic pressure.  3. Left atrial size was moderately dilated.  4. Mild mitral valve regurgitation.   Patient Profile     59 year old gentleman with history of coronary artery disease, prior PCI, recent CABG April 2021, postoperative atrial fibrillation, PAD, history of arterial bifemoral bypass 2013, diabetes type 2 COPD on 3 L nasal cannula oxygen at home, prior smoker stopped 10 years ago, presented with worsening shortness of breath  Assessment & Plan    A/P: Acute respiratory distress Acute on chronic diastolic CHF, anemia, COPD, long smoking history, unable to exclude pleural effusion -Improving renal function consistent with cardiorenal syndrome -Would continue IV Lasix twice daily  Acute stroke Notes indicating baseline level is poor, chronic mild confusion CT head, MRI brain with stroke Would continue aspirin with Eliquis Followed by neurology  Atrial fibrillation, persistent Developed postoperatively following CABG April 2021 despite treatment with IV amiodarone as inpatient Treated with oral amiodarone through April, has been on as outpatient On Eliquis 5 twice daily Likely contributing to diastolic heart failure symptoms --Plan is for cardioversion at some point.  Procedure is not urgent given he is asymptomatic,  appears he has been compliant with his anticoagulation throughout -Would be ideal to get him back to his baseline before using general anesthesia  Confusion Issues at baseline even prior to surgery per family Confusion in the hospital following CABG Baseline appears poor  CAD with stable angina, CABG Normal ejection fraction on echo ,no new focal wall motion abnormalities Troponin negative  Acute on chronic renal failure Appears to be improving with diuresis consistent with cardiorenal syndrome -Continue Lasix as above  COPD Long smoking history, on 3 L nasal cannula oxygen at  home Will wean oxygen,  currently on levels above his baseline  Case discussed with hospitalist service  Total encounter time more than 35 minutes  Greater than 50% was spent in counseling and coordination of care with the patient   For questions or updates, please contact [place] Please consult [email or web address] for contact info under        Signed, Ida Rogue, MD  11/25/2019, 1:40 PM

## 2019-11-25 NOTE — Progress Notes (Signed)
PROGRESS NOTE    Larry Terrell  TDS:[id number] DOB: [birth year] DOA: 11/11/2019 PCP: Medicine, [employer]      Brief Narrative:  Larry Terrell is a 59 y.o. M with hx CAD s/p PCI x2 2015 and CABG x5 last month at [facility], postop A. fib on amiodarone and Eliquis, COPD on 2 L home O2, depression, chronic pain, DM, HTN, CHF EF 45%, obesity, and PVD status post aortofemoral bypass, CKD 3B baseline creatinine 1.7, and NASH who presents with fall.  Caveat the patient is now confused, poorly able to provide history.  Per report, patient was at SNF today when he fell, hit his head, was brought to the ER.  In the ER, the patient was initially hypoxic requiring 3-4L oxygen (baseline at 2.  CT head unremarkable.  Chest x-ray showed edema.  Electrolytes and blood count and renal function stable relative to baseline, hemodynamically stable.  While in the ER, the patient became progressively more encephalopathic and hypoxic to 70% on 4 L,, and confused.  Was placed on BiPAP, Lasix was given, and the hospital service were asked to evaluate for respiratory failure and confusion.       Assessment & Plan:  Acute on chronic hypoxic respiratory failure Respiratory failure due to acute on chronic systolic and diastolic CHF Presented with fall, found to have increased O2 needs, edema on CXR.  BNP 200s.  Echo during hospitalization for CABG last month showed EF 45%.  Not on ARB due to renal dysfunction.    Lasix increased yesterday, put out 1700 cc overnight.  Creatinine improving.   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 -Continue aspirin, Eliquis -Echocardiogram showed no cardiogenic source of embolism -Lipids ordered: continue atorvastatin -Atrial fibrillation: present, already anticoagulated -tPA not given because on Eliquis at admission -Dysphagia screen ordered in ER -PT eval ordered -Smoking cessation: not pertinent    Acute metabolic encephalopathy superimposed on baseline cognitive impairment Due to underlying CHF flare, new stroke. EEG showed diffuse slowing.  Occupational Therapy and speech therapy noticed cognitive deficits, which family note are similar to his baseline.  Possibly exacerbated by vision loss, and if he has some parietal lobe infarct/attention deficits -Avoid sedating medicines.  -PT/OT  Coronary disease, secondary prevention Peripheral vascular disease, secondary prevention Hypertension Recent CABG -Continue aspirin, atorvastatin, Zetia, Isordil famotidine, pantoprazole  Diabetes with macrovascular complications Glucoses well controlled -Continue home Lantus -Continue SS correction insulin  COPD No wheezing  Depression Chronic pain -Continue venlafaxine, Lyrica, oxycodone  CKD stage IIIb Creatinine stable today, relative to recent baseline  Atrial fibrillation, paroxysmal This developed post CABG last month.  He is on amiodarone and Eliquis.  CTH has ruled out bleeding. HR normal -Continue amiodarone and Eliquis  History of seizures No seizures -Continue Keppra         Disposition: Status is: Inpatient  Remains inpatient appropriate because:He remains fluid overloaded on increased level oxygen,, and with rales on exam.  He will need ongoing IV Lasix.   Dispo: The patient is from: SNF  Anticipated d/c is to: SNF              Anticipated d/c date is: 2 days              Patient currently is not medically stable to d/c.               MDM: The below labs and  imaging reports were reviewed and summarized above.  Medication management as above.  This is a severe exacerbation of his chronic disease  DVT prophylaxis: N/A on systemic anticoagulation Code Status: FULL Family Communication: Sister by phone, code status confirmed    Consultants:     Procedures:     Antimicrobials:   CTX and azithromycin x1 on 5/13   Culture data:   5/13 blood culture x2 -- ngtd           Subjective: Patient has no chest pain, headache, focal weakness, focal numbness.  He is somewhat disoriented, but appears to be interactive, coherent relative to his baseline.  He is somewhat dyspneic.     Objective: Vitals:   11/25/19 1015 11/25/19 1017 11/25/19 1019 11/25/19 1214  BP: 109/88 123/82 136/79 (!) 142/93  Pulse: 64 84 68 68  Resp:    18  Temp:    98.4 F (36.9 C)  TempSrc:    Oral  SpO2: 92% 94% 96% 94%  Weight:      Height:        Intake/Output Summary (Last 24 hours) at 11/25/2019 1458 Last data filed at 11/25/2019 0700 Gross per 24 hour  Intake 240 ml  Output 1550 ml  Net -1310 ml   Filed Weights   12/02/2019 1152 11/24/19 0500 11/25/19 0416  Weight: 89.8 kg 86.8 kg 85.7 kg    Examination: General appearance: Chronically ill-appearing adult male, alert and in no acute distress.  Eating himself breakfast HEENT: Anicteric, conjunctiva pink, lids and lashes normal.  Slight ptosis on the left, left iris defect from old surgery.  No nasal deformity, discharge, epistaxis.  Lips moist, mostly edentulous, oropharynx moist, no oral lesions, hearing normal.   Skin: Warm and dry.  Mildly pale, no jaundice.  No suspicious rashes or lesions. Cardiac: RRR, nl S1-S2, no murmurs appreciated.  Capillary refill is brisk.  JVP not visible.  No LE edema.  Radial pulses 2+ and symmetric. Respiratory: Normal respiratory rate and rhythm.  Crackles at bases bilaterally, no wheezing. Abdomen: Abdomen soft.  No TTP or guarding. No ascites, distension,  hepatosplenomegaly.   MSK: No deformities or effusions.  Diffuse loss of subcutaneous muscle mass and fat. Neuro: Awake and alert, feeding himself breakfast.  EOMI, moves all extremities with severe generalized weakness, but symmetric strength. Speech fluent.    Psych: Sensorium intact and responding to questions, attention normal. Affect blunted.  Judgment and insight appear moderately impaired.  Oriented to self, hospital, not to year or specific hospital.    Data Reviewed: I have personally reviewed following labs and imaging studies:  CBC: Recent Labs  Lab 11/19/2019 1208 11/24/19 0015 11/25/19 0520  WBC 8.8 7.9 8.2  NEUTROABS 7.3  --   --   HGB 9.9* 9.6* 10.1*  HCT 32.0* 31.1* 31.4*  MCV 97.0 97.8 94.0  PLT 215 205 341   Basic Metabolic Panel: Recent Labs  Lab 11/18/2019 1208 11/24/19 0015 11/25/19 0520  NA 139 140 141  K 4.5 3.7 3.9  CL 103 101 102  CO2 25 29 31   GLUCOSE 180* 259* 212*  BUN 34*  34* 27*  CREATININE 1.72* 1.60* 1.29*  CALCIUM 8.9 8.6* 8.7*   GFR: Estimated Creatinine Clearance: 62.9 mL/min (A) (by C-G formula based on SCr of 1.29 mg/dL (H)). Liver Function Tests: Recent Labs  Lab 12/04/2019 1208 11/24/19 0015  AST 19 16  ALT 13 12  ALKPHOS 100 97  BILITOT 1.0 0.7  PROT 6.9 6.7  ALBUMIN 3.1* 2.8*   No results for input(s): LIPASE, AMYLASE in the last 168 hours. Recent Labs  Lab 11/22/2019 1951  AMMONIA <9*   Coagulation Profile: Recent Labs  Lab 12/05/2019 1208  INR 1.9*   Cardiac Enzymes: No results for input(s): CKTOTAL, CKMB, CKMBINDEX, TROPONINI in the last 168 hours. BNP (last 3 results) No results for input(s): PROBNP in the last 8760 hours. HbA1C: Recent Labs    11/24/19 0015 11/25/19 0520  HGBA1C 5.3 5.2   CBG: Recent Labs  Lab 11/24/19 1132 11/24/19 1648 11/24/19 2106 11/25/19 0839 11/25/19 1142  GLUCAP 225* 128* 157* 221* 243*   Lipid Profile: Recent Labs    11/25/19 0520  CHOL 49  HDL 22*  LDLCALC 13  TRIG  69  CHOLHDL 2.2   Thyroid Function Tests: Recent Labs    11/22/2019 1208  TSH 2.583   Anemia Panel: Recent Labs    12/06/2019 1951  VITAMINB12 444   Urine analysis:    Component Value Date/Time   COLORURINE YELLOW 10/25/2019 0424   APPEARANCEUR CLEAR 10/25/2019 0424   APPEARANCEUR Clear 11/06/2014 1527   LABSPEC 1.009 10/25/2019 0424   LABSPEC 1.008 11/06/2014 1527   PHURINE 6.0 10/25/2019 0424   GLUCOSEU NEGATIVE 10/25/2019 0424   GLUCOSEU Negative 11/06/2014 1527   HGBUR NEGATIVE 10/25/2019 0424   BILIRUBINUR NEGATIVE 10/25/2019 0424   BILIRUBINUR Negative 11/06/2014 1527   KETONESUR NEGATIVE 10/25/2019 0424   PROTEINUR 100 (A) 10/25/2019 0424   NITRITE NEGATIVE 10/25/2019 0424   LEUKOCYTESUR NEGATIVE 10/25/2019 0424   LEUKOCYTESUR Negative 11/06/2014 1527   Sepsis Labs: @LABRCNTIP (procalcitonin:4,lacticacidven:4)  ) Recent Results (from the past 240 hour(s))  SARS Coronavirus 2 by RT PCR (hospital order, performed in [place] hospital lab) Nasopharyngeal Nasopharyngeal Swab     Status: None   Collection Time: 12/05/2019  2:05 PM   Specimen: Nasopharyngeal Swab  Result Value Ref Range Status   SARS Coronavirus 2 NEGATIVE NEGATIVE Final    Comment: (NOTE) SARS-CoV-2 target nucleic acids are NOT DETECTED. The SARS-CoV-2 RNA is generally detectable in upper and lower respiratory specimens during the acute phase of infection. The lowest concentration of SARS-CoV-2 viral copies this assay can detect is 250 copies / mL. A negative result does not preclude SARS-CoV-2 infection and should not be used as the sole basis for treatment or other patient management decisions.  A negative result may occur with improper specimen collection / handling, submission of specimen other than nasopharyngeal swab, presence of viral mutation(s) within the areas targeted by this assay, and inadequate number of viral copies (<250 copies / mL). A negative result must be combined with  clinical observations, patient history, and epidemiological information. Fact Sheet for Patients:   StrictlyIdeas.no Fact Sheet for Healthcare Providers: BankingDealers.co.za This test is not yet approved or cleared  by the [place] FDA and has been authorized for detection and/or diagnosis of SARS-CoV-2 by FDA under an Emergency Use Authorization (EUA).  This EUA will remain in effect (meaning this test can be used) for the duration of the COVID-19 declaration under Section 564(b)(1) of the Act, 21 U.S.C. section 360bbb-3(b)(1), unless the  authorization is terminated or revoked sooner. Performed at [facility], [address] [place], [place] [address]   Culture, blood (x 2)     Status: None (Preliminary result)   Collection Time: 12/05/2019  8:54 PM   Specimen: BLOOD  Result Value Ref Range Status   Specimen Description BLOOD RAC  Final   Special Requests BOTTLES DRAWN AEROBIC AND ANAEROBIC BCAV  Final   Culture   Final    NO GROWTH 2 DAYS Performed at [facility], [address] [place], [place] [address]    Report Status PENDING  Incomplete  Culture, blood (x 2)     Status: None (Preliminary result)   Collection Time: 11/19/2019  8:54 PM   Specimen: BLOOD  Result Value Ref Range Status   Specimen Description BLOOD BRH  Final   Special Requests BOTTLES DRAWN AEROBIC AND ANAEROBIC BCAV  Final   Culture   Final    NO GROWTH 2 DAYS Performed at [facility], [address], [place],  [address]    Report Status PENDING  Incomplete         Radiology Studies: EEG  Result Date: 11/24/2019 Alexis Goodell, MD     11/24/2019  4:44 PM ELECTROENCEPHALOGRAM REPORT Patient: Evonnie Dawes       Room #: 249A-AA EEG No. ID: [id number] Age: 59 y.o.        Sex: male Requesting Physician: Alexza Norbeck Report Date:  11/24/2019       Interpreting Physician: Alexis Goodell History: Zameer Borman is an 59  y.o. male with altered mental status Medications: Amiodarone, Eliquis, ASA, Insulin, Lasix, Keppra, Lopressor, Lyrica, Effexor, Zetia, Lipitor Conditions of Recording:  This is a 21 channel routine scalp EEG performed with bipolar and monopolar montages arranged in accordance to the international 10/20 system of electrode placement. One channel was dedicated to EKG recording. The patient is in the awake state. Description:  The background activity is slow and poorly organized.  It consists of low voltage activity in the delta-theta continuum.  This activity is diffusely distributed and continuous throughout the recording.  No epileptiform activity is noted.  Hyperventilation was not performed. Intermittent photic stimulation was performed but failed to illicit any change in the tracing. IMPRESSION: This is an abnormal EEG secondary to general background slowing.  This finding may be seen with a diffuse disturbance that is etiologically nonspecific, but may include a metabolic encephalopathy, among other possibilities.  No epileptiform activity was noted.  Alexis Goodell, MD Neurology [phone] 11/24/2019, 4:41 PM   MR BRAIN WO CONTRAST  Result Date: 11/24/2019 CLINICAL DATA:  Focal neurological deficit. Stroke suspected. EXAM: MRI HEAD WITHOUT CONTRAST TECHNIQUE: Multiplanar, multiecho pulse sequences of the brain and surrounding structures were obtained without intravenous contrast. COMPARISON:  Head CT Nov 23, 2019 FINDINGS: Brain: Area of restricted diffusion involving the splenium of the corpus callosum on the left side, posterior aspect of the left fornix, lateral geniculate body, left occipital lobe and medial temporal lobe, including the body of the left hippocampus. Findings are consistent with a left PCA territory infarct. A single focus of restricted diffusion is also seen in the left corona radiata. Remote lacunar infarcts are seen in the right cerebellar hemisphere. Scattered foci of T2  hyperintensity are seen within the white matter of cerebral hemispheres and within the pons, nonspecific, most likely related to chronic small vessel ischemia. More pronounced than expected for age. Mild prominence of the ventricular system and cerebellar sulci reflecting parenchymal  volume loss. There is no hemorrhage, hydrocephalus, extra-axial collection or mass lesion. Vascular: Diminutive caliber of the horizontal petrous segment of the left ICA. Remainder of the major arterial flow voids are preserved. Skull and upper cervical spine: Normal marrow signal. Sinuses/Orbits: Left lens surgery. Other: None. IMPRESSION: 1. Acute left PCA territory infarct. 2. Punctate focus of restricted diffusion in the left corona radiata, consistent with acute infarct. 3. Diminutive caliber of the horizontal petrous segment of the left ICA. 4. 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 5. Moderate chronic small vessel ischemic changes. More pronounced than expected for age. These results were called by telephone at the time of interpretation on 11/24/2019 at 12:13 pm to provider [facility] , who verbally acknowledged these results. Electronically Signed   By: Pedro Earls M.D.   On: 11/24/2019 12:19   US Carotid Bilateral (at [facility] and [facility] only)  Result Date: 11/24/2019 CLINICAL DATA:  59 year old male with stroke-like symptoms EXAM: BILATERAL CAROTID DUPLEX ULTRASOUND TECHNIQUE: Pearline Cables scale imaging, color Doppler and duplex ultrasound were performed of bilateral carotid and vertebral arteries in the neck. COMPARISON:  Prior duplex carotid ultrasound 11/06/2014 FINDINGS: Criteria: Quantification of carotid stenosis is based on velocity parameters that correlate the residual internal carotid diameter with NASCET-based stenosis levels, using the diameter of the distal internal carotid lumen as the denominator for stenosis measurement. The following velocity measurements were obtained:  RIGHT ICA: 70/28 cm/sec CCA: 94/85 cm/sec SYSTOLIC ICA/CCA RATIO:  0.8 ECA:  142 cm/sec LEFT ICA: 53/23 cm/sec CCA: 462/70 cm/sec SYSTOLIC ICA/CCA RATIO:  0.5 ECA:  188 cm/sec RIGHT CAROTID ARTERY: Mild heterogeneous atherosclerotic plaque in the proximal internal carotid artery. By peak systolic velocity criteria, the estimated stenosis is less than 50%. RIGHT VERTEBRAL ARTERY:  Patent with antegrade flow. LEFT CAROTID ARTERY: Mild heterogeneous atherosclerotic plaque in the proximal internal carotid artery. By peak systolic velocity criteria, the estimated stenosis is less than 50%. LEFT VERTEBRAL ARTERY:  Patent with antegrade flow. IMPRESSION: 1. Mild (1-49%) stenosis proximal right internal carotid artery secondary to mild heterogeneous atherosclerotic plaque. 2. Mild (1-49%) stenosis proximal left internal carotid artery secondary to mild heterogeneous atherosclerotic plaque. 3. The vertebral arteries are patent with normal antegrade flow. Signed, Criselda Peaches, MD, [facility] Radiology Electronically Signed   By: Jacqulynn Cadet M.D.   On: 11/24/2019 15:37   ECHOCARDIOGRAM COMPLETE  Result Date: 11/24/2019    ECHOCARDIOGRAM REPORT   Patient Name:   [facility] LEE Sherman Date of Exam: 11/24/2019 Medical Rec #:  [id number]       Height:       67.0 in Accession #:    [id number]      Weight:       191.4 lb Date of Birth:  1960/07/23        BSA:          1.985 m Patient Age:    57 years        BP:           152/75 mmHg Patient Gender: M               HR:           67 bpm. Exam Location:  [facility] Procedure: 2D Echo, Cardiac Doppler and Color Doppler Indications:     Stroke 434.91  History:         Patient has prior history of Echocardiogram examinations, most  recent 10/26/2019. TIA and COPD; Risk Factors:Diabetes and Sleep                  Apnea.  Sonographer:     Sherrie Sport RDCS (AE) Referring Phys:  [id number] Suann Larry Apolonia Ellwood Diagnosing  Phys: Ida Rogue MD  Sonographer Comments: Suboptimal apical window. IMPRESSIONS  1. Left ventricular ejection fraction, by estimation, is 55 to 60%. The left ventricle has normal function. The left ventricle has no regional wall motion abnormalities. There is mild left ventricular hypertrophy. Left ventricular diastolic parameters are indeterminate.  2. Right ventricular systolic function is normal. The right ventricular size is normal. There is normal pulmonary artery systolic pressure.  3. Left atrial size was moderately dilated.  4. Mild mitral valve regurgitation.  5. Images suggestive of left pleural effusion FINDINGS  Left Ventricle: Left ventricular ejection fraction, by estimation, is 55 to 60%. The left ventricle has normal function. The left ventricle has no regional wall motion abnormalities. The left ventricular internal cavity size was normal in size. There is  mild left ventricular hypertrophy. Left ventricular diastolic parameters are indeterminate. Right Ventricle: The right ventricular size is normal. No increase in right ventricular wall thickness. Right ventricular systolic function is normal. There is normal pulmonary artery systolic pressure. The tricuspid regurgitant velocity is 2.44 m/s, and  with an assumed right atrial pressure of 10 mmHg, the estimated right ventricular systolic pressure is 88.8 mmHg. Left Atrium: Left atrial size was moderately dilated. Right Atrium: Right atrial size was normal in size. Pericardium: A small pericardial effusion is present. Mitral Valve: The mitral valve is normal in structure. Normal mobility of the mitral valve leaflets. Mild mitral valve regurgitation. No evidence of mitral valve stenosis. Tricuspid Valve: The tricuspid valve is normal in structure. Tricuspid valve regurgitation is not demonstrated. No evidence of tricuspid stenosis. Aortic Valve: The aortic valve is normal in structure. Aortic valve regurgitation is not visualized. No aortic  stenosis is present. Aortic valve mean gradient measures 5.7 mmHg. Aortic valve peak gradient measures 11.4 mmHg. Aortic valve area, by VTI measures 1.68 cm. Pulmonic Valve: The pulmonic valve was normal in structure. Pulmonic valve regurgitation is not visualized. No evidence of pulmonic stenosis. Aorta: The aortic root is normal in size and structure. Venous: The inferior vena cava is normal in size with greater than 50% respiratory variability, suggesting right atrial pressure of 3 mmHg. IAS/Shunts: No atrial level shunt detected by color flow Doppler.  LEFT VENTRICLE PLAX 2D LVIDd:         3.38 cm LVIDs:         2.28 cm LV PW:         1.19 cm LV IVS:        0.97 cm LVOT diam:     2.00 cm LV SV:         50 LV SV Index:   25 LVOT Area:     3.14 cm  RIGHT VENTRICLE RV Basal diam:  3.36 cm RV S prime:     10.00 cm/s TAPSE (M-mode): 3.2 cm LEFT ATRIUM             Index       RIGHT ATRIUM           Index LA diam:        5.30 cm 2.67 cm/m  RA Area:     21.10 cm LA Vol (A2C):   77.4 ml 39.00 ml/m RA Volume:   61.70 ml  31.09 ml/m  LA Vol (A4C):   82.1 ml 41.37 ml/m LA Biplane Vol: 80.1 ml 40.36 ml/m  AORTIC VALVE                    PULMONIC VALVE AV Area (Vmax):    1.88 cm     PV Vmax:        0.69 m/s AV Area (Vmean):   1.84 cm     PV Peak grad:   1.9 mmHg AV Area (VTI):     1.68 cm     RVOT Peak grad: 2 mmHg AV Vmax:           168.67 cm/s AV Vmean:          111.333 cm/s AV VTI:            0.299 m AV Peak Grad:      11.4 mmHg AV Mean Grad:      5.7 mmHg LVOT Vmax:         101.00 cm/s LVOT Vmean:        65.200 cm/s LVOT VTI:          0.160 m LVOT/AV VTI ratio: 0.53  AORTA Ao Root diam: 2.90 cm MITRAL VALVE                TRICUSPID VALVE MV Area (PHT): 4.77 cm     TR Peak grad:   23.8 mmHg MV Decel Time: 159 msec     TR Vmax:        244.00 cm/s MV E velocity: 131.00 cm/s                             SHUNTS                             Systemic VTI:  0.16 m                             Systemic Diam: 2.00 cm  Ida Rogue MD Electronically signed by Ida Rogue MD Signature Date/Time: 11/24/2019/2:50:13 PM    Final         Scheduled Meds: .  stroke: mapping our early stages of recovery book   Does not apply Once  . amiodarone  200 mg Oral Daily  . apixaban  5 mg Oral BID  . aspirin  81 mg Oral Daily  . atorvastatin  80 mg Oral QPM  . ezetimibe  10 mg Oral Daily  . famotidine  20 mg Oral BID  . furosemide  80 mg Intravenous BID  . insulin aspart  0-15 Units Subcutaneous TID WC  . insulin aspart  0-5 Units Subcutaneous QHS  . insulin glargine  5 Units Subcutaneous Daily  . levETIRAcetam  500 mg Oral TID  . mouth rinse  15 mL Mouth Rinse BID  . metoprolol tartrate  37.5 mg Oral BID  . pantoprazole  40 mg Oral Daily  . pregabalin  50 mg Oral TID  . venlafaxine  75 mg Oral TID   Continuous Infusions:    LOS: 2 days    Time spent: 35 minutes    Edwin Dada, MD [facility] 11/25/2019, 2:58 PM     Please page though [place] or Epic secure chat:  For [employer], Adult nurse

## 2019-11-25 NOTE — Progress Notes (Signed)
Ice pack applied to mid back area as pt still having some back pain after med.

## 2019-11-25 NOTE — Progress Notes (Signed)
Pt now on bipap due to low O2 sats and shallow inspiration on Cpap.  Pt is now w/ O2 sat of 97% on bipap.

## 2019-11-25 NOTE — Progress Notes (Signed)
Inpatient Rehab Admissions Coordinator:   Pt. Is a SNF resident at baseline. Recommend discharge back to SNF. CIR will sign off at this time.   Clemens Catholic, [place], [place] Admissions Coordinator  [phone] ([place]) [phone] (office)

## 2019-11-25 NOTE — Evaluation (Signed)
Occupational Therapy Evaluation Patient Details Name: Larry Terrell MRN: [id number] DOB: Jun 03, 1961 Today's Date: 11/25/2019    History of Present Illness Pt is a 59 y.o. M with hx CAD s/p PCI x2 2015 and CABG x5 last month at [facility], postop A. fib on amiodarone and Eliquis, COPD on 2 L home O2, depression, chronic pain, DM, HTN, CHF EF 45%, obesity, and PVD status post aortofemoral bypass, CKD 3B baseline creatinine 1.7, and NASH who presents with fall, and confusion placed on bipap in ED. MRI + for Acute left PCA territory infarct, Punctate focus of restricted diffusion in the left corona radiata, consistent with acute infarct. 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   Clinical Impression   Larry Terrell was seen for OT evaluation this date. Prior to hospital admission, pt was MOD I c SPC and requiring assist from family for ADLs. Pt presents to acute OT demonstrating impaired ADL performance, functional cognition, and functional mobility 2/2 decreased safety awareness, decreased command following, functional strength/ROM/balance/endurance deficits, and decreased fine motor control. Pt currently requires SUPERVISION to adjust B socks long sitting in chair. SETUP & SBA self-drinking seated in chair - pt self-corrected dropping cup. Pt received and left up in chair, deferred mobility/OOB ADL assessment this date 2/2 bradycardia seated in chair - RN aware pt HR fluctuating mid 30s to high 80s t/o seated session. Pt would benefit from skilled OT to address noted impairments and functional limitations (see below for any additional details) in order to maximize safety and independence while minimizing falls risk and caregiver burden. Upon hospital discharge, recommend STR to maximize pt safety and return to PLOF.     Follow Up Recommendations  SNF;Supervision/Assistance - 24 hour    Equipment Recommendations       Recommendations for Other Services       Precautions / Restrictions  Precautions Precautions: Sternal;Fall Precaution Booklet Issued: No Restrictions Weight Bearing Restrictions: Yes Other Position/Activity Restrictions: sternal precautions      Mobility Bed Mobility Overal bed mobility: Needs Assistance             General bed mobility comments: Pt received and left up in chair  Transfers Overall transfer level: Needs assistance               General transfer comment: Mobility deferred 2/2 bradycardic HR    Balance Overall balance assessment: Needs assistance                                         ADL either performed or assessed with clinical judgement   ADL Overall ADL's : Needs assistance/impaired                                       General ADL Comments: SUPERVISION adjust B socks long sitting in chair. SETUP & SBA  self-drinking seated in chair - pt self-corrected dropping cup.      Vision   Vision Assessment?: Yes;Vision impaired- to be further tested in functional context Tracking/Visual Pursuits: Decreased smoothness of horizontal tracking;Decreased smoothness of vertical tracking;Requires cues, head turns, or add eye shifts to track Convergence: Impaired (comment) Additional Comments: Pt demonstrated difficulty in all 4 quadrants during H test, unable to track laterally or vertically     Perception     Praxis  Pertinent Vitals/Pain Pain Assessment: No/denies pain Pain Intervention(s): Limited activity within patient's tolerance     Hand Dominance Right   Extremity/Trunk Assessment Upper Extremity Assessment Upper Extremity Assessment: RUE deficits/detail;LUE deficits/detail RUE Deficits / Details: Grip 4-/5. AROM: shoulder flexion ~90*, elbow flexion WFL. Unable to complete 5 finger opposition but achieved thumb-2nd digit opposition. Unable to achieve FNF  LUE Deficits / Details: Grip 3+/5. AROM: shoulder flexion 90*, elbow flexion WFL. Unable to achieve 5 finger  oppositionor FNF, pt is able to touch L digit to nose, suspect command following limiting ability to complete FNF   Lower Extremity Assessment Lower Extremity Assessment: Defer to PT evaluation       Communication Communication Communication: Other (comment)(slurred/garbled speech)   Cognition Arousal/Alertness: Awake/alert(Slow to respond, intermittently less responsive ) Behavior During Therapy: Flat affect Overall Cognitive Status: History of cognitive impairments - at baseline Area of Impairment: Orientation;Attention;Memory;Following commands;Safety/judgement;Problem solving                 Orientation Level: Disoriented to;Time;Situation Current Attention Level: Selective Memory: Decreased recall of precautions;Decreased short-term memory Following Commands: Follows one step commands consistently;Follows one step commands with increased time Safety/Judgement: Decreased awareness of safety;Decreased awareness of deficits   Problem Solving: Slow processing;Decreased initiation;Requires verbal cues;Requires tactile cues General Comments: Difficulty following commands for coordination tests (FNF, 5 finger opposition) despite MAX multimodal cues. Unable to teach back call bell education. Pt oriented to self; pt oriented to place c categorical cues; states time only after extensive logical/categorical prompting    General Comments  SpO2 stable on 9L high flow [place] t/o. HR noted to jump from mid 30s to high 80s t/o session - pt denies change in breathing/fatigue/responsiveness. RN notified.     Exercises Exercises: Other exercises Other Exercises Other Exercises: Pt educated re: OT role, call bell use, falls prevention Other Exercises: Self-drinking, simulated grooming tasks, LBD   Shoulder Instructions      Home Living Family/patient expects to be discharged to:: Private residence Living Arrangements: Other relatives(cousin) Available Help at Discharge: Family Type of Home:  House Home Access: Ramped entrance     Home Layout: One level     Bathroom Shower/Tub: Tub/shower unit         Home Equipment: Walker - 2 wheels;Grab bars - toilet;Grab bars - tub/shower;Cane - single point   Additional Comments: information on equipment taken from previous admission due to poor ability to recall this date  Lives With: Family(brother and cousin)    Prior Functioning/Environment Level of Independence: Needs assistance  Gait / Transfers Assistance Needed: Family stated that after his rehab stay, he was walking, walking without a cane but does have a cane ADL's / Homemaking Assistance Needed: brother and cousin help with home tasks   Comments: 1 fall, that led to admission        OT Problem List: Decreased strength;Decreased knowledge of use of DME or AE;Decreased knowledge of precautions;Decreased activity tolerance;Decreased cognition;Cardiopulmonary status limiting activity;Impaired UE functional use;Impaired balance (sitting and/or standing);Decreased safety awareness;Impaired vision/perception;Decreased coordination;Decreased range of motion      OT Treatment/Interventions: Therapeutic exercise;Neuromuscular education;Energy conservation;DME and/or AE instruction;Therapeutic activities;Cognitive remediation/compensation;Visual/perceptual remediation/compensation;Patient/family education;Balance training;Self-care/ADL training    OT Goals(Current goals can be found in the care plan section) Acute Rehab OT Goals Patient Stated Goal: to get stronger OT Goal Formulation: With patient Time For Goal Achievement: 12/09/19 Potential to Achieve Goals: Fair ADL Goals Pt Will Perform Eating: with set-up;sitting;with supervision Pt Will Perform Lower Body Dressing: with set-up;with supervision;sit to/from  stand(c LRAD PRN) Pt Will Transfer to Toilet: with min assist;stand pivot transfer;bedside commode(c LRAD PRN) Additional ADL Goal #1: Pt will independently verbalize  x3 functional applications of sternal precautions  OT Frequency: Min 2X/week   Barriers to D/C: Inaccessible home environment;Decreased caregiver support          Co-evaluation              AM-PAC OT "6 Clicks" Daily Activity     Outcome Measure Help from another person eating meals?: A Little Help from another person taking care of personal grooming?: A Lot Help from another person toileting, which includes using toliet, bedpan, or urinal?: A Lot Help from another person bathing (including washing, rinsing, drying)?: A Little Help from another person to put on and taking off regular upper body clothing?: A Little Help from another person to put on and taking off regular lower body clothing?: A Lot 6 Click Score: 15   End of Session Equipment Utilized During Treatment: Oxygen(9L hf [place] )  Activity Tolerance: Treatment limited secondary to medical complications (Comment)(Tx limited 2/2 bradycardia and poor command following ) Patient left: in chair;with call bell/phone within reach;with chair alarm set  OT Visit Diagnosis: Unsteadiness on feet (R26.81);Other abnormalities of gait and mobility (R26.89);Muscle weakness (generalized) (M62.81)                Time: 6629-4765 OT Time Calculation (min): 6 min Charges:  OT General Charges $OT Visit: 1 Visit OT Evaluation $OT Eval Low Complexity: 1 Low  Dessie Coma, M.S. OTR/L  11/25/19, 11:22 AM

## 2019-11-25 NOTE — Plan of Care (Signed)
  Problem: Education: Goal: Knowledge of General Education information will improve Description: Including pain rating scale, medication(s)/side effects and non-pharmacologic comfort measures Outcome: Progressing   Problem: Health Behavior/Discharge Planning: Goal: Ability to manage health-related needs will improve Outcome: Progressing   Problem: Education: Goal: Knowledge of disease or condition will improve Outcome: Progressing Goal: Knowledge of secondary prevention will improve Outcome: Progressing

## 2019-11-25 NOTE — Progress Notes (Signed)
Nightshift unit NP made aware of BP's.  No further orders.

## 2019-11-25 NOTE — Progress Notes (Signed)
Pt took his bipap off and said he needed a break from it.  Pt placed on O2 at 6 liters  but O2 sat low so increased to 9 liters.  O2 sat still 88-90%.  Pt now on high flow O2  at 10 and O2 sat is 95%.  Pt asked for a snack and snack provided.

## 2019-11-25 NOTE — Consult Note (Signed)
Subjective: Mentation improved slightly. He does follow commands. R sided drift still present.  MRI with L PCA infarcts.   Past Medical History:  Diagnosis Date  . CHF (congestive heart failure) (HCC)    diastolic (echo at [facility] 6644) EF 55-60%  . Cirrhosis ([place])    NASH per records  . COPD (chronic obstructive pulmonary disease) ([place])   . Depression   . Diabetes mellitus with neuropathy ([place])   . Heart attack ([place])   . Neuropathy   . OSA (obstructive sleep apnea)   . Peripheral vascular disease due to secondary diabetes mellitus ([place])   . Splenomegaly   . Temporal giant cell arteritis ([place])   . TIA (transient ischemic attack) April 2016    Past Surgical History:  Procedure Laterality Date  . AORTA - FEMORAL ARTERY BYPASS GRAFT Bilateral   . CARDIAC CATHETERIZATION    . cardiac stents  July 2015   . CORONARY ARTERY BYPASS GRAFT N/A 10/26/2019   Procedure: CORONARY ARTERY BYPASS GRAFTING (CABG) x 5, Using Bilateral mammary arteries, and right leg greater saphenous vein harvested endoscopically;  Surgeon: Wonda Olds, MD;  Location: [place];  Service: Open Heart Surgery;  Laterality: N/A;  . CORONARY STENT INTERVENTION N/A 08/09/2017   Procedure: CORONARY STENT INTERVENTION;  Surgeon: Wellington Hampshire, MD;  Location: [place] [facility];  Service: Cardiovascular;  Laterality: N/A;  . LAPAROSCOPIC APPENDECTOMY N/A 07/10/2015   Procedure: APPENDECTOMY LAPAROSCOPIC;  Surgeon: Rolm Bookbinder, MD;  Location: [place];  Service: General;  Laterality: N/A;  . LEFT HEART CATH AND CORONARY ANGIOGRAPHY N/A 08/09/2017   Procedure: LEFT HEART CATH AND CORONARY ANGIOGRAPHY;  Surgeon: Wellington Hampshire, MD;  Location: [place] [facility];  Service: Cardiovascular;  Laterality: N/A;  . LEFT HEART CATH AND CORONARY ANGIOGRAPHY N/A 10/19/2019   Procedure: LEFT HEART CATH AND CORONARY ANGIOGRAPHY;  Surgeon: Minna Merritts, MD;  Location: [place] [facility] LAB;  Service: Cardiovascular;  Laterality:  N/A;  . RADIAL ARTERY HARVEST Left 10/26/2019   Procedure: RADIAL ARTERY HARVEST;  Surgeon: Wonda Olds, MD;  Location: [place];  Service: Open Heart Surgery;  Laterality: Left;  . TEE WITHOUT CARDIOVERSION N/A 10/26/2019   Procedure: TRANSESOPHAGEAL ECHOCARDIOGRAM (TEE);  Surgeon: Wonda Olds, MD;  Location: [place];  Service: Open Heart Surgery;  Laterality: N/A;    Family History  Problem Relation Age of Onset  . Heart attack Mother 15  . Stroke Mother   . Heart attack Father   . Alzheimer's disease Father    Social History:  reports that he quit smoking about 12 years ago. His smoking use included cigarettes. He has never used smokeless tobacco. He reports that he does not drink alcohol or use drugs.  Allergies:  Allergies  Allergen Reactions  . Gabapentin   . Ace Inhibitors Cough    Other reaction(s): Other (See Comments) cough    Medications:  I have reviewed the patient's current medications. Prior to Admission:  Medications Prior to Admission  Medication Sig Dispense Refill Last Dose  . acetaminophen (TYLENOL) 500 MG tablet Take 500-1,000 mg by mouth daily as needed for mild pain or fever.    Unknown at PRN  . amiodarone (PACERONE) 200 MG tablet Take 1 tablet (200 mg total) by mouth daily.   12/02/2019 at 0900  . apixaban (ELIQUIS) 5 MG TABS tablet Take 1 tablet (5 mg total) by mouth 2 (two) times daily. 60 tablet  11/18/2019 at 0900  . aspirin 81 MG  chewable tablet Chew 1 tablet (81 mg total) by mouth daily. 30 tablet 1 12/02/2019 at 0900  . atorvastatin (LIPITOR) 80 MG tablet Take 1 tablet (80 mg total) by mouth daily. (Patient taking differently: Take 80 mg by mouth every evening. ) 90 tablet 3 11/22/2019 at 1800  . ezetimibe (ZETIA) 10 MG tablet Take 1 tablet (10 mg total) by mouth daily. (Patient taking differently: Take 10 mg by mouth daily in the afternoon. ) 90 tablet 3 11/22/2019 at 1700  . famotidine (PEPCID) 20 MG tablet Take 1 tablet (20 mg total) by mouth 2  (two) times daily. 8 tablet 0 11/22/2019 at 0600  . insulin aspart (NOVOLOG) 100 UNIT/ML injection Inject 0-15 Units into the skin 4 (four) times daily - after meals and at bedtime. (Patient taking differently: Inject 0-20 Units into the skin 4 (four) times daily - after meals and at bedtime. Inject according to blood sugar readings:  < 60u: 0u and contact MD 120-160: 2u 161-200: 4u 201-250: 8u 251-300: 12u 301-350: 16u 351-450: contact MD) 10 mL 11   . insulin aspart (NOVOLOG) 100 UNIT/ML injection Inject 10 Units into the skin 3 (three) times daily with meals. 10 mL 11 12/02/2019 at 0730  . insulin glargine (LANTUS) 100 UNIT/ML injection Inject 0.05 mLs (5 Units total) into the skin daily. 10 mL 11 11/22/2019 at 0730  . isosorbide dinitrate (ISORDIL) 20 MG tablet Take 1 tablet (20 mg total) by mouth 3 (three) times daily. For 1 month, then discontinue (Patient taking differently: Take 20 mg by mouth 3 (three) times daily. )   11/18/2019 at 0900  . levETIRAcetam (KEPPRA) 500 MG tablet Take 500 mg by mouth 3 (three) times daily.   11/18/2019 at 0900  . metFORMIN (GLUCOPHAGE) 500 MG tablet Take 500 mg by mouth 2 (two) times daily with a meal.   11/13/2019 at 0730  . Metoprolol Tartrate 37.5 MG TABS Take 37.5 mg by mouth 2 (two) times daily.   11/14/2019 at 0900  . nitroGLYCERIN (NITROSTAT) 0.4 MG SL tablet Place 0.4 mg under the tongue every 5 (five) minutes as needed for chest pain.   Unknown at PRN  . oxyCODONE (OXY IR/ROXICODONE) 5 MG immediate release tablet Take 1-2 tablets (5-10 mg total) by mouth every 4 (four) hours as needed for severe pain. 30 tablet 0 Unknown at PRN  . pantoprazole (PROTONIX) 40 MG tablet Take 1 tablet (40 mg total) by mouth daily. 30 tablet 0 11/26/2019 at 0600  . pregabalin (LYRICA) 50 MG capsule Take 1 capsule (50 mg total) by mouth 3 (three) times daily. 90 capsule 2 11/28/2019 at 090  . therapeutic multivitamin-minerals (THERAGRAN-M) tablet Take 1 tablet by mouth daily.    11/22/2019 at 1600  . venlafaxine (EFFEXOR) 75 MG tablet Take 75 mg by mouth 3 (three) times daily.   12/09/2019 at 0900   Scheduled: .  stroke: mapping our early stages of recovery book   Does not apply Once  . amiodarone  200 mg Oral Daily  . apixaban  5 mg Oral BID  . aspirin  81 mg Oral Daily  . atorvastatin  80 mg Oral QPM  . ezetimibe  10 mg Oral Daily  . famotidine  20 mg Oral BID  . furosemide  80 mg Intravenous BID  . insulin aspart  0-15 Units Subcutaneous TID WC  . insulin aspart  0-5 Units Subcutaneous QHS  . insulin glargine  5 Units Subcutaneous Daily  . levETIRAcetam  500 mg Oral  TID  . mouth rinse  15 mL Mouth Rinse BID  . metoprolol tartrate  37.5 mg Oral BID  . pantoprazole  40 mg Oral Daily  . pregabalin  50 mg Oral TID  . venlafaxine  75 mg Oral TID    ROS: History obtained from the patient  General ROS: negative for - chills, fatigue, fever, night sweats, weight gain or weight loss Psychological ROS: negative for - behavioral disorder, hallucinations, memory difficulties, mood swings or suicidal ideation Ophthalmic ROS: negative for - blurry vision, double vision, eye pain or loss of vision ENT ROS: negative for - epistaxis, nasal discharge, oral lesions, sore throat, tinnitus or vertigo Allergy and Immunology ROS: negative for - hives or itchy/watery eyes Hematological and Lymphatic ROS: negative for - bleeding problems, bruising or swollen lymph nodes Endocrine ROS: negative for - galactorrhea, hair pattern changes, polydipsia/polyuria or temperature intolerance Respiratory ROS: shortness of breath Cardiovascular ROS: negative for - chest pain, dyspnea on exertion, edema or irregular heartbeat Gastrointestinal ROS: negative for - abdominal pain, diarrhea, hematemesis, nausea/vomiting or stool incontinence Genito-Urinary ROS: negative for - dysuria, hematuria, incontinence or urinary frequency/urgency Musculoskeletal ROS: negative for - joint swelling or  muscular weakness Neurological ROS: as noted in HPI Dermatological ROS: negative for rash and skin lesion changes  Physical Examination: Blood pressure (!) 142/93, pulse 68, temperature 98.4 F (36.9 C), temperature source Oral, resp. rate 18, height 5' 7"  (1.702 m), weight 85.7 kg, SpO2 94 %.  HEENT-  Normocephalic, no lesions, without obvious abnormality.  Normal external eye and conjunctiva.  Normal TM's bilaterally.  Normal auditory canals and external ears. Normal external nose, mucus membranes and septum.  Normal pharynx. Cardiovascular- S1, S2 normal, pulses palpable throughout   Lungs- chest clear, no wheezing, rales, normal symmetric air entry Abdomen- soft, non-tender; bowel sounds normal; no masses,  no organomegaly Extremities- no edema Lymph-no adenopathy palpable Musculoskeletal-no joint tenderness, deformity or swelling Skin-warm and dry, no hyperpigmentation, vitiligo, or suspicious lesions  Neurological Examination   Mental Status: Alert, oriented to name but not to age or year/month.  Speech fluent.  Requires some reinforcement to follow 3-step commands Cranial Nerves: II: Visual fields grossly normal, pupils equal, round, reactive to Hauger and accommodation III,IV, VI: ptosis not present, extra-ocular motions intact bilaterally V,VII: smile symmetric, facial Abelson touch sensation normal bilaterally VIII: hearing normal bilaterally IX,X: gag reflex present XI: bilateral shoulder shrug XII: midline tongue extension Motor: Right : Upper extremity   5/5    Left:     Upper extremity   4+/5  Lower extremity   5/5     Lower extremity   5/5 Tone and bulk:normal tone throughout; no atrophy noted Sensory: Pinprick and Brueggemann touch intact throughout, bilaterally Deep Tendon Reflexes: Symmetric throughout Plantars: Right: mute   Left: mute Cerebellar: Normal finger-to-nose and normal heel-to-shin testing bilaterally Gait: not tested due to safety concerns   Laboratory  Studies:  Basic Metabolic Panel: Recent Labs  Lab 11/22/2019 1208 11/24/19 0015 11/25/19 0520  NA 139 140 141  K 4.5 3.7 3.9  CL 103 101 102  CO2 25 29 31   GLUCOSE 180* 259* 212*  BUN 34* 34* 27*  CREATININE 1.72* 1.60* 1.29*  CALCIUM 8.9 8.6* 8.7*    Liver Function Tests: Recent Labs  Lab 12/01/2019 1208 11/24/19 0015  AST 19 16  ALT 13 12  ALKPHOS 100 97  BILITOT 1.0 0.7  PROT 6.9 6.7  ALBUMIN 3.1* 2.8*   No results for input(s): LIPASE, AMYLASE  in the last 168 hours. Recent Labs  Lab 11/25/2019 1951  AMMONIA <9*    CBC: Recent Labs  Lab 12/07/2019 1208 11/24/19 0015 11/25/19 0520  WBC 8.8 7.9 8.2  NEUTROABS 7.3  --   --   HGB 9.9* 9.6* 10.1*  HCT 32.0* 31.1* 31.4*  MCV 97.0 97.8 94.0  PLT 215 205 193    Cardiac Enzymes: No results for input(s): CKTOTAL, CKMB, CKMBINDEX, TROPONINI in the last 168 hours.  BNP: Invalid input(s): POCBNP  CBG: Recent Labs  Lab 11/24/19 1132 11/24/19 1648 11/24/19 2106 11/25/19 0839 11/25/19 1142  GLUCAP 225* 128* 157* 221* 243*    Microbiology: Results for orders placed or performed during the hospital encounter of 12/02/2019  SARS Coronavirus 2 by RT PCR (hospital order, performed in [facility] hospital lab) Nasopharyngeal Nasopharyngeal Swab     Status: None   Collection Time: 12/08/2019  2:05 PM   Specimen: Nasopharyngeal Swab  Result Value Ref Range Status   SARS Coronavirus 2 NEGATIVE NEGATIVE Final    Comment: (NOTE) SARS-CoV-2 target nucleic acids are NOT DETECTED. The SARS-CoV-2 RNA is generally detectable in upper and lower respiratory specimens during the acute phase of infection. The lowest concentration of SARS-CoV-2 viral copies this assay can detect is 250 copies / mL. A negative result does not preclude SARS-CoV-2 infection and should not be used as the sole basis for treatment or other patient management decisions.  A negative result may occur with improper specimen collection / handling, submission  of specimen other than nasopharyngeal swab, presence of viral mutation(s) within the areas targeted by this assay, and inadequate number of viral copies (<250 copies / mL). A negative result must be combined with clinical observations, patient history, and epidemiological information. Fact Sheet for Patients:   StrictlyIdeas.no Fact Sheet for Healthcare Providers: BankingDealers.co.za This test is not yet approved or cleared  by the [place] FDA and has been authorized for detection and/or diagnosis of SARS-CoV-2 by FDA under an Emergency Use Authorization (EUA).  This EUA will remain in effect (meaning this test can be used) for the duration of the COVID-19 declaration under Section 564(b)(1) of the Act, 21 U.S.C. section 360bbb-3(b)(1), unless the authorization is terminated or revoked sooner. Performed at [facility], [address] [place], [place] [address]   Culture, blood (x 2)     Status: None (Preliminary result)   Collection Time: 11/27/2019  8:54 PM   Specimen: BLOOD  Result Value Ref Range Status   Specimen Description BLOOD RAC  Final   Special Requests BOTTLES DRAWN AEROBIC AND ANAEROBIC BCAV  Final   Culture   Final    NO GROWTH 2 DAYS Performed at [facility], [address] [place], [place] [address]    Report Status PENDING  Incomplete  Culture, blood (x 2)     Status: None (Preliminary result)   Collection Time: 11/24/2019  8:54 PM   Specimen: BLOOD  Result Value Ref Range Status   Specimen Description BLOOD [facility]  Final   Special Requests BOTTLES DRAWN AEROBIC AND ANAEROBIC BCAV  Final   Culture   Final    NO GROWTH 2 DAYS Performed at [facility], [address] [place], [place] [address]    Report Status PENDING  Incomplete    Coagulation Studies: Recent Labs    12/06/2019 1208  LABPROT 20.8*  INR 1.9*    Urinalysis: No results for input(s): COLORURINE, LABSPEC,  PHURINE, GLUCOSEU, HGBUR, BILIRUBINUR, KETONESUR, PROTEINUR, UROBILINOGEN,  NITRITE, LEUKOCYTESUR in the last 168 hours.  Invalid input(s): APPERANCEUR  Lipid Panel:    Component Value Date/Time   CHOL 49 11/25/2019 0520   CHOL 172 11/07/2014 0418   TRIG 69 11/25/2019 0520   TRIG 298 (H) 11/07/2014 0418   HDL 22 (L) 11/25/2019 0520   HDL 25 (L) 11/07/2014 0418   CHOLHDL 2.2 11/25/2019 0520   VLDL 14 11/25/2019 0520   VLDL 60 (H) 11/07/2014 0418   LDLCALC 13 11/25/2019 0520   LDLCALC 87 11/07/2014 0418    HgbA1C:  Lab Results  Component Value Date   HGBA1C 5.3 11/24/2019    Urine Drug Screen:      Component Value Date/Time   LABOPIA NONE DETECTED 03/26/2019 2258   COCAINSCRNUR NONE DETECTED 03/26/2019 2258   LABBENZ NONE DETECTED 03/26/2019 2258   AMPHETMU NONE DETECTED 03/26/2019 2258   THCU POSITIVE (A) 03/26/2019 2258   LABBARB NONE DETECTED 03/26/2019 2258    Alcohol Level: No results for input(s): ETH in the last 168 hours.  Other results: EKG: atrial fibrillation, rate 87 bpm.  Imaging: EEG  Result Date: 11/24/2019 Alexis Goodell, MD     11/24/2019  4:44 PM ELECTROENCEPHALOGRAM REPORT Patient: Evonnie Dawes       Room #: 249A-AA EEG No. ID: [id number] Age: 59 y.o.        Sex: male Requesting Physician: Danford Report Date:  11/24/2019       Interpreting Physician: Alexis Goodell History: Mable Lashley is an 59 y.o. male with altered mental status Medications: Amiodarone, Eliquis, ASA, Insulin, Lasix, Keppra, Lopressor, Lyrica, Effexor, Zetia, Lipitor Conditions of Recording:  This is a 21 channel routine scalp EEG performed with bipolar and monopolar montages arranged in accordance to the international 10/20 system of electrode placement. One channel was dedicated to EKG recording. The patient is in the awake state. Description:  The background activity is slow and poorly organized.  It consists of low voltage activity in the delta-theta continuum.  This activity is  diffusely distributed and continuous throughout the recording.  No epileptiform activity is noted.  Hyperventilation was not performed. Intermittent photic stimulation was performed but failed to illicit any change in the tracing. IMPRESSION: This is an abnormal EEG secondary to general background slowing.  This finding may be seen with a diffuse disturbance that is etiologically nonspecific, but may include a metabolic encephalopathy, among other possibilities.  No epileptiform activity was noted.  Alexis Goodell, MD Neurology [phone] 11/24/2019, 4:41 PM   MR BRAIN WO CONTRAST  Result Date: 11/24/2019 CLINICAL DATA:  Focal neurological deficit. Stroke suspected. EXAM: MRI HEAD WITHOUT CONTRAST TECHNIQUE: Multiplanar, multiecho pulse sequences of the brain and surrounding structures were obtained without intravenous contrast. COMPARISON:  Head CT Nov 23, 2019 FINDINGS: Brain: Area of restricted diffusion involving the splenium of the corpus callosum on the left side, posterior aspect of the left fornix, lateral geniculate body, left occipital lobe and medial temporal lobe, including the body of the left hippocampus. Findings are consistent with a left PCA territory infarct. A single focus of restricted diffusion is also seen in the left corona radiata. Remote lacunar infarcts are seen in the right cerebellar hemisphere. Scattered foci of T2 hyperintensity are seen within the white matter of cerebral hemispheres and within the pons, nonspecific, most likely related to chronic small vessel ischemia. More pronounced than expected for age. Mild prominence of the ventricular system and cerebellar sulci reflecting parenchymal volume loss. There is no hemorrhage, hydrocephalus, extra-axial collection or  mass lesion. Vascular: Diminutive caliber of the horizontal petrous segment of the left ICA. Remainder of the major arterial flow voids are preserved. Skull and upper cervical spine: Normal marrow signal.  Sinuses/Orbits: Left lens surgery. Other: None. IMPRESSION: 1. Acute left PCA territory infarct. 2. Punctate focus of restricted diffusion in the left corona radiata, consistent with acute infarct. 3. Diminutive caliber of the horizontal petrous segment of the left ICA. 4. 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 5. Moderate chronic small vessel ischemic changes. More pronounced than expected for age. These results were called by telephone at the time of interpretation on 11/24/2019 at 12:13 pm to provider [facility] , who verbally acknowledged these results. Electronically Signed   By: Pedro Earls M.D.   On: 11/24/2019 12:19   US Carotid Bilateral (at [facility] and [facility] only)  Result Date: 11/24/2019 CLINICAL DATA:  59 year old male with stroke-like symptoms EXAM: BILATERAL CAROTID DUPLEX ULTRASOUND TECHNIQUE: Pearline Cables scale imaging, color Doppler and duplex ultrasound were performed of bilateral carotid and vertebral arteries in the neck. COMPARISON:  Prior duplex carotid ultrasound 11/06/2014 FINDINGS: Criteria: Quantification of carotid stenosis is based on velocity parameters that correlate the residual internal carotid diameter with NASCET-based stenosis levels, using the diameter of the distal internal carotid lumen as the denominator for stenosis measurement. The following velocity measurements were obtained: RIGHT ICA: 70/28 cm/sec CCA: 14/48 cm/sec SYSTOLIC ICA/CCA RATIO:  0.8 ECA:  142 cm/sec LEFT ICA: 53/23 cm/sec CCA: 185/63 cm/sec SYSTOLIC ICA/CCA RATIO:  0.5 ECA:  188 cm/sec RIGHT CAROTID ARTERY: Mild heterogeneous atherosclerotic plaque in the proximal internal carotid artery. By peak systolic velocity criteria, the estimated stenosis is less than 50%. RIGHT VERTEBRAL ARTERY:  Patent with antegrade flow. LEFT CAROTID ARTERY: Mild heterogeneous atherosclerotic plaque in the proximal internal carotid artery. By peak systolic velocity criteria, the estimated  stenosis is less than 50%. LEFT VERTEBRAL ARTERY:  Patent with antegrade flow. IMPRESSION: 1. Mild (1-49%) stenosis proximal right internal carotid artery secondary to mild heterogeneous atherosclerotic plaque. 2. Mild (1-49%) stenosis proximal left internal carotid artery secondary to mild heterogeneous atherosclerotic plaque. 3. The vertebral arteries are patent with normal antegrade flow. Signed, Criselda Peaches, MD, [facility] Electronically Signed   By: Jacqulynn Cadet M.D.   On: 11/24/2019 15:37   ECHOCARDIOGRAM COMPLETE  Result Date: 11/24/2019    ECHOCARDIOGRAM REPORT   Patient Name:   [facility] LEE Cocke Date of Exam: 11/24/2019 Medical Rec #:  [id number]       Height:       67.0 in Accession #:    [id number]      Weight:       191.4 lb Date of Birth:  07/19/1960        BSA:          1.985 m Patient Age:    34 years        BP:           152/75 mmHg Patient Gender: M               HR:           67 bpm. Exam Location:  [facility] Procedure: 2D Echo, Cardiac Doppler and Color Doppler Indications:     Stroke 434.91  History:         Patient has prior history of Echocardiogram examinations, most  recent 10/26/2019. TIA and COPD; Risk Factors:Diabetes and Sleep                  Apnea.  Sonographer:     Sherrie Sport RDCS (AE) Referring Phys:  [id number] Suann Larry DANFORD Diagnosing Phys: Ida Rogue MD  Sonographer Comments: Suboptimal apical window. IMPRESSIONS  1. Left ventricular ejection fraction, by estimation, is 55 to 60%. The left ventricle has normal function. The left ventricle has no regional wall motion abnormalities. There is mild left ventricular hypertrophy. Left ventricular diastolic parameters are indeterminate.  2. Right ventricular systolic function is normal. The right ventricular size is normal. There is normal pulmonary artery systolic pressure.  3. Left atrial size was moderately dilated.  4. Mild mitral valve  regurgitation.  5. Images suggestive of left pleural effusion FINDINGS  Left Ventricle: Left ventricular ejection fraction, by estimation, is 55 to 60%. The left ventricle has normal function. The left ventricle has no regional wall motion abnormalities. The left ventricular internal cavity size was normal in size. There is  mild left ventricular hypertrophy. Left ventricular diastolic parameters are indeterminate. Right Ventricle: The right ventricular size is normal. No increase in right ventricular wall thickness. Right ventricular systolic function is normal. There is normal pulmonary artery systolic pressure. The tricuspid regurgitant velocity is 2.44 m/s, and  with an assumed right atrial pressure of 10 mmHg, the estimated right ventricular systolic pressure is 78.4 mmHg. Left Atrium: Left atrial size was moderately dilated. Right Atrium: Right atrial size was normal in size. Pericardium: A small pericardial effusion is present. Mitral Valve: The mitral valve is normal in structure. Normal mobility of the mitral valve leaflets. Mild mitral valve regurgitation. No evidence of mitral valve stenosis. Tricuspid Valve: The tricuspid valve is normal in structure. Tricuspid valve regurgitation is not demonstrated. No evidence of tricuspid stenosis. Aortic Valve: The aortic valve is normal in structure. Aortic valve regurgitation is not visualized. No aortic stenosis is present. Aortic valve mean gradient measures 5.7 mmHg. Aortic valve peak gradient measures 11.4 mmHg. Aortic valve area, by VTI measures 1.68 cm. Pulmonic Valve: The pulmonic valve was normal in structure. Pulmonic valve regurgitation is not visualized. No evidence of pulmonic stenosis. Aorta: The aortic root is normal in size and structure. Venous: The inferior vena cava is normal in size with greater than 50% respiratory variability, suggesting right atrial pressure of 3 mmHg. IAS/Shunts: No atrial level shunt detected by color flow Doppler.  LEFT  VENTRICLE PLAX 2D LVIDd:         3.38 cm LVIDs:         2.28 cm LV PW:         1.19 cm LV IVS:        0.97 cm LVOT diam:     2.00 cm LV SV:         50 LV SV Index:   25 LVOT Area:     3.14 cm  RIGHT VENTRICLE RV Basal diam:  3.36 cm RV S prime:     10.00 cm/s TAPSE (M-mode): 3.2 cm LEFT ATRIUM             Index       RIGHT ATRIUM           Index LA diam:        5.30 cm 2.67 cm/m  RA Area:     21.10 cm LA Vol (A2C):   77.4 ml 39.00 ml/m RA Volume:   61.70 ml  31.09 ml/m  LA Vol (A4C):   82.1 ml 41.37 ml/m LA Biplane Vol: 80.1 ml 40.36 ml/m  AORTIC VALVE                    PULMONIC VALVE AV Area (Vmax):    1.88 cm     PV Vmax:        0.69 m/s AV Area (Vmean):   1.84 cm     PV Peak grad:   1.9 mmHg AV Area (VTI):     1.68 cm     RVOT Peak grad: 2 mmHg AV Vmax:           168.67 cm/s AV Vmean:          111.333 cm/s AV VTI:            0.299 m AV Peak Grad:      11.4 mmHg AV Mean Grad:      5.7 mmHg LVOT Vmax:         101.00 cm/s LVOT Vmean:        65.200 cm/s LVOT VTI:          0.160 m LVOT/AV VTI ratio: 0.53  AORTA Ao Root diam: 2.90 cm MITRAL VALVE                TRICUSPID VALVE MV Area (PHT): 4.77 cm     TR Peak grad:   23.8 mmHg MV Decel Time: 159 msec     TR Vmax:        244.00 cm/s MV E velocity: 131.00 cm/s                             SHUNTS                             Systemic VTI:  0.16 m                             Systemic Diam: 2.00 cm Ida Rogue MD Electronically signed by Ida Rogue MD Signature Date/Time: 11/24/2019/2:50:13 PM    Final     Assessment: 59 y.o. male with hx CAD s/p PCI x2 2015 and CABG x5 last month at [facility], postop A. fib on amiodarone and Eliquis, COPD on 2 L home O2, depression, chronic pain, DM, HTN, CHF EF 45%, obesity, and PVD status post aortofemoral bypass, CKD 3B baseline creatinine 1.7, and NASH who presented on yesterday after a fall.  Patient has been confused since that time.  Initial head CT personally reviewed and shows no acute changes.  MRI of the brain  personally reviewed as well and shows an acute left PCA territory infarct and a punctate acute infarct in the left corona radiata.  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  Suspect embolic etiology for findings.  Patient not eligible for tPA and outside time window for intervention with low NIHSS and no focal weakness on neurological examination.  Patient on ASA and Eliquis.   A1c 5.3.   - MRI with L PCA infarcts - pt is on ASA and plavix - pt/ot  - EEG with diffuse slowing no seizure activity - No further imaging from neurological perspective

## 2019-11-25 NOTE — Progress Notes (Signed)
Physical Therapy Treatment Patient Details Name: Larry Terrell MRN: [id number] DOB: 1960-08-19 Today's Date: 11/25/2019    History of Present Illness Pt is a 59 y.o. M with hx CAD s/p PCI x2 2015 and CABG x5 last month at [facility], postop A. fib on amiodarone and Eliquis, COPD on 2 L home O2, depression, chronic pain, DM, HTN, CHF EF 45%, obesity, and PVD status post aortofemoral bypass, CKD 3B baseline creatinine 1.7, and NASH who presents with fall, and confusion placed on bipap in ED. MRI + for Acute left PCA territory infarct, Punctate focus of restricted diffusion in the left corona radiata, consistent with acute infarct. 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    PT Comments    Pt was long sitting in bed upon arriving. He agrees to PT session and is alert however does have occasions with altered alertness. He is oriented to self but disoriented to situation/day/time/setting. BP at rest 152/95, sao2 94% on 9 L HFNC, and HR 72bpm.He agrees to OOB activity. Unable to recall sternal precautions. He required mod assist to exit L side of bed. Sat EOB x several minutes without complaints however BP dropped to 109/88. With prolonged sitting BP elevated to 122/79. He was able to stand and take several steps to recliner with +1 UE HHA. Once seated in recliner, HR dropped to 38bpm with decreasing  RR to 6bpm. He reports no symptoms but has 2 occasions with poor alertness for several secounds. Pt on two separate monitors during and both reading consistently. RN notified. Pt left on 9 L HFNC and was seated in recliner. OT arrived at conclusion of PT session. Will continue to follow per current POC.       Follow Up Recommendations  SNF;CIR     Equipment Recommendations  Other (comment)(defer to next level of care)    Recommendations for Other Services       Precautions / Restrictions Precautions Precautions: Sternal;Fall Precaution Booklet Issued: No Restrictions Weight Bearing  Restrictions: Yes Other Position/Activity Restrictions: sternal precautions    Mobility  Bed Mobility Overal bed mobility: Needs Assistance Bed Mobility: Supine to Sit Rolling: Mod assist Sidelying to sit: Mod assist Supine to sit: Mod assist;HOB elevated     General bed mobility comments: Pt required incraesed time to perform with vcs to adhere to sternal precautions. mod assist to achieve EOB sitting. sao2 > 90% throughout session on 9 L HFNC. supine BP 152/95. upon sitting EOB 109/88. pt reports no symptoms and after several minuted BP 122/79  Transfers Overall transfer level: Needs assistance Equipment used: 1 person hand held assist Transfers: Sit to/from Stand Sit to Stand: Mod assist         General transfer comment: Pt was able to stand EOB 1 x and stand pivot to recliner one time. did not progress to ambulation 2/2 to pt have episodes of bradycardia with low RR (6 BPM) at times. HR dropped to 38 bpm from upper 80s. RN informed. OT arrived at conclusion of session.  Ambulation/Gait             General Gait Details: unsafe 2/2 to cardiac concerns   Stairs             Wheelchair Mobility    Modified Rankin (Stroke Patients Only)       Balance Overall balance assessment: Needs assistance  Cognition Arousal/Alertness: Awake/alert Behavior During Therapy: Flat affect Overall Cognitive Status: History of cognitive impairments - at baseline Area of Impairment: Orientation;Attention;Memory;Following commands;Safety/judgement;Awareness;Problem solving                 Orientation Level: Disoriented to;Time;Place;Situation Current Attention Level: Selective Memory: Decreased recall of precautions;Decreased short-term memory Following Commands: Follows one step commands inconsistently Safety/Judgement: Decreased awareness of safety;Decreased awareness of deficits Awareness:  Intellectual Problem Solving: Slow processing;Decreased initiation;Difficulty sequencing;Requires verbal cues;Requires tactile cues General Comments: pt is pleasnatly confused. Altered mental status with fluctuating ability to follow commands      Exercises Other Exercises Other Exercises: Pt educated re: OT role, call bell use, falls prevention Other Exercises: Self-drinking, simulated grooming tasks, LBD    General Comments General comments (skin integrity, edema, etc.): SpO2 stable on 9L high flow  t/o. HR noted to jump from mid 30s to high 80s t/o session - pt denies change in breathing/fatigue/responsiveness. RN notified.       Pertinent Vitals/Pain Pain Assessment: No/denies pain Pain Intervention(s): Limited activity within patient's tolerance    Home Living Family/patient expects to be discharged to:: Private residence Living Arrangements: Other relatives(cousin) Available Help at Discharge: Family Type of Home: House Home Access: Ramped entrance   Home Layout: One level Home Equipment: Walker - 2 wheels;Grab bars - toilet;Grab bars - tub/shower;Cane - single point Additional Comments: information on equipment taken from previous admission due to poor ability to recall this date    Prior Function Level of Independence: Needs assistance  Gait / Transfers Assistance Needed: Family stated that after his rehab stay, he was walking, walking without a cane but does have a cane ADL's / Homemaking Assistance Needed: brother and cousin help with home tasks Comments: 1 fall, that led to admission   PT Goals (current goals can now be found in the care plan section) Acute Rehab PT Goals Patient Stated Goal: none stated Progress towards PT goals: Progressing toward goals    Frequency    7X/week      PT Plan Current plan remains appropriate    Co-evaluation     PT goals addressed during session: Mobility/safety with mobility        AM-PAC PT "6 Clicks" Mobility    Outcome Measure  Help needed turning from your back to your side while in a flat bed without using bedrails?: A Lot Help needed moving from lying on your back to sitting on the side of a flat bed without using bedrails?: A Lot Help needed moving to and from a bed to a chair (including a wheelchair)?: A Lot Help needed standing up from a chair using your arms (e.g., wheelchair or bedside chair)?: A Lot Help needed to walk in hospital room?: A Lot Help needed climbing 3-5 steps with a railing? : Total 6 Click Score: 11    End of Session Equipment Utilized During Treatment: Gait belt;Oxygen Activity Tolerance: Patient limited by fatigue Patient left: in chair;with call bell/phone within reach;with chair alarm set;Other (comment)(OT in room) Nurse Communication: Mobility status;Precautions PT Visit Diagnosis: Unsteadiness on feet (R26.81);Other abnormalities of gait and mobility (R26.89);Muscle weakness (generalized) (M62.81);Difficulty in walking, not elsewhere classified (R26.2)     Time: 9562-1308 PT Time Calculation (min) (ACUTE ONLY): 18 min  Charges:  $Therapeutic Activity: 8-22 mins                     Julaine Fusi PTA 11/25/19, 11:59 AM

## 2019-11-25 NOTE — Progress Notes (Signed)
Pt stated that the ice pack is helping relieve his back pain.

## 2019-11-26 DIAGNOSIS — I10 Essential (primary) hypertension: Secondary | ICD-10-CM

## 2019-11-26 DIAGNOSIS — I5033 Acute on chronic diastolic (congestive) heart failure: Secondary | ICD-10-CM

## 2019-11-26 DIAGNOSIS — G9340 Encephalopathy, unspecified: Secondary | ICD-10-CM

## 2019-11-26 LAB — BASIC METABOLIC PANEL
Anion gap: 10 (ref 5–15)
BUN: 29 mg/dL — ABNORMAL HIGH (ref 8–23)
CO2: 32 mmol/L (ref 22–32)
Calcium: 8.4 mg/dL — ABNORMAL LOW (ref 8.9–10.3)
Chloride: 99 mmol/L (ref 98–111)
Creatinine, Ser: 1.23 mg/dL (ref 0.61–1.24)
GFR calc Af Amer: >60 mL/min (ref >60)
GFR calc non Af Amer: >60 mL/min (ref >60)
Glucose, Bld: 162 mg/dL — ABNORMAL HIGH (ref 70–99)
Potassium: 3.6 mmol/L (ref 3.5–5.1)
Sodium: 141 mmol/L (ref 135–145)

## 2019-11-26 LAB — GLUCOSE, CAPILLARY
Glucose-Capillary: 147 mg/dL — ABNORMAL HIGH (ref 70–99)
Glucose-Capillary: 188 mg/dL — ABNORMAL HIGH (ref 70–99)
Glucose-Capillary: 157 mg/dL — ABNORMAL HIGH (ref 70–99)
Glucose-Capillary: 187 mg/dL — ABNORMAL HIGH (ref 70–99)

## 2019-11-26 LAB — CBC
HCT: 29.6 % — ABNORMAL LOW (ref 39.0–52.0)
Hemoglobin: 9.6 g/dL — ABNORMAL LOW (ref 13.0–17.0)
MCH: 30.1 pg (ref 26.0–34.0)
MCHC: 32.4 g/dL (ref 30.0–36.0)
MCV: 92.8 fL (ref 80.0–100.0)
Platelets: 173 10*3/uL (ref 150–400)
RBC: 3.19 MIL/uL — ABNORMAL LOW (ref 4.22–5.81)
RDW: 19.1 % — ABNORMAL HIGH (ref 11.5–15.5)
WBC: 9.6 10*3/uL (ref 4.0–10.5)
nRBC: 0 % (ref 0.0–0.2)

## 2019-11-26 MED ORDER — FUROSEMIDE 10 MG/ML IJ SOLN
80.0000 mg | Freq: Three times a day (TID) | INTRAMUSCULAR | Status: AC
  Administered 2019-11-26 – 2019-11-27 (×3): 80 mg via INTRAVENOUS
  Filled 2019-11-26 (×3): qty 8

## 2019-11-26 MED ORDER — POTASSIUM CHLORIDE CRYS ER 20 MEQ PO TBCR
20.0000 meq | EXTENDED_RELEASE_TABLET | Freq: Every day | ORAL | Status: DC
  Administered 2019-11-26: 20 meq via ORAL
  Filled 2019-11-26: qty 1

## 2019-11-26 NOTE — Progress Notes (Signed)
Physical Therapy Treatment Patient Details Name: Larry Terrell MRN: [id number] DOB: [birth year] Today's Date: 11/26/2019    History of Present Illness Pt is a 59 y.o. M with hx CAD s/p PCI x2 2015 and CABG x5 last month at [facility], postop A. fib on amiodarone and Eliquis, COPD on 2 L home O2, depression, chronic pain, DM, HTN, CHF EF 45%, obesity, and PVD status post aortofemoral bypass, CKD 3B baseline creatinine 1.7, and NASH who presents with fall, and confusion placed on bipap in ED. MRI + for Acute left PCA territory infarct, Punctate focus of restricted diffusion in the left corona radiata, consistent with acute infarct. 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    PT Comments    Patient agrees to PT treatment but reports that he wants to get to the chair later today. Patient reports no pain. Patient performs AAROM BLE with max cues and inability to follow commands for proper technique 75% of treatment. He is mod assist for supine<> sit with confusion and difficulty understanding his verbal responses. Patient declines transfers and will continue to be progressed with mobility as cognitive status allows . Patient will continue to benefit from skilled PT to improve mobility and strength.   Follow Up Recommendations  SNF     Equipment Recommendations       Recommendations for Other Services       Precautions / Restrictions Precautions Precautions: Sternal;Fall Precaution Booklet Issued: No Restrictions Weight Bearing Restrictions: No Other Position/Activity Restrictions: sternal precautions    Mobility  Bed Mobility Overal bed mobility: Needs Assistance Bed Mobility: Supine to Sit Rolling: Mod assist Sidelying to sit: Mod assist Supine to sit: Mod assist;HOB elevated     General bed mobility comments: Needs cues for safety  Transfers Overall transfer level: (pt declined)                  Ambulation/Gait                 Stairs              Wheelchair Mobility    Modified Rankin (Stroke Patients Only)       Balance Overall balance assessment: Needs assistance                                          Cognition Arousal/Alertness: Awake/alert Behavior During Therapy: Flat affect Overall Cognitive Status: History of cognitive impairments - at baseline Area of Impairment: Orientation;Attention;Memory;Following commands;Safety/judgement;Awareness;Problem solving                 Orientation Level: Disoriented to;Time;Place;Situation Current Attention Level: Selective Memory: Decreased recall of precautions;Decreased short-term memory Following Commands: Follows one step commands inconsistently Safety/Judgement: Decreased awareness of safety;Decreased awareness of deficits Awareness: Intellectual Problem Solving: Slow processing;Decreased initiation;Difficulty sequencing;Requires verbal cues;Requires tactile cues General Comments: pt is pleasnatly confused. Altered mental status with fluctuating ability to follow commands      Exercises General Exercises - Lower Extremity Long Arc Quad: AAROM;Strengthening;Right;Left;10 reps    General Comments        Pertinent Vitals/Pain      Home Living                      Prior Function            PT Goals (current goals can now be found in the care plan  section) Acute Rehab PT Goals Patient Stated Goal: none stated Progress towards PT goals: Progressing toward goals    Frequency    7X/week      PT Plan Current plan remains appropriate    Co-evaluation              AM-PAC PT "6 Clicks" Mobility   Outcome Measure  Help needed turning from your back to your side while in a flat bed without using bedrails?: A Lot Help needed moving from lying on your back to sitting on the side of a flat bed without using bedrails?: A Lot Help needed moving to and from a bed to a chair (including a wheelchair)?: A Lot Help  needed standing up from a chair using your arms (e.g., wheelchair or bedside chair)?: A Lot Help needed to walk in hospital room?: A Lot Help needed climbing 3-5 steps with a railing? : A Lot 6 Click Score: 12    End of Session Equipment Utilized During Treatment: Gait belt;Oxygen Activity Tolerance: Patient tolerated treatment well Patient left: in bed;with bed alarm set Nurse Communication: Mobility status PT Visit Diagnosis: Unsteadiness on feet (R26.81);Other abnormalities of gait and mobility (R26.89);Muscle weakness (generalized) (M62.81);Difficulty in walking, not elsewhere classified (R26.2)     Time: 2202-5427 PT Time Calculation (min) (ACUTE ONLY): 15 min  Charges:  $Therapeutic Exercise: 8-22 mins                        Alanson Puls, PT DPT 11/26/2019, 12:33 PM

## 2019-11-26 NOTE — Progress Notes (Signed)
PROGRESS NOTE    Kristofor Michalowski  AVW:[id number] DOB: 10/13/1960 DOA: 11/13/2019 PCP: Medicine, [employer]      Brief Narrative:  Mr. Campusano is a 59 y.o. M with hx CAD s/p PCI x2 2015 and CABG x5 last month at [facility], postop A. fib on amiodarone and Eliquis, COPD on 2 L home O2, depression, chronic pain, DM, HTN, CHF EF 45%, obesity, and PVD status post aortofemoral bypass, CKD 3B baseline creatinine 1.7, and NASH who presents with fall.  Caveat the patient is now confused, poorly able to provide history.  Per report, patient was at SNF today when he fell, hit his head, was brought to the ER.  In the ER, the patient was initially hypoxic requiring 3-4L oxygen (baseline at 2.  CT head unremarkable.  Chest x-ray showed edema.  Electrolytes and blood count and renal function stable relative to baseline, hemodynamically stable.  While in the ER, the patient became progressively more encephalopathic and hypoxic to 70% on 4 L,, and confused.  Was placed on BiPAP, Lasix was given, and the hospital service were asked to evaluate for respiratory failure and confusion.            Assessment & Plan:  Acute on chronic hypoxic respiratory failure due to chronic systolic and diastolic CHF 1.5 L net negative yesterday, creatinine still improving, potassium normal However, chest x-ray yesterday worsening opacities, he is more rhonchorous on exam, and his oxygen requirements have gone up -Increase furosemide to 80 mg 3 times daily -K supplement -Strict I/Os, daily weights, telemetry  -Daily monitoring renal function -Consult cardiology, appreciate cares    Acute left posterior stroke, likely embolic -Non-invasive angiography showed diffuse atherosclerosis -Echocardiogram showed no cardiogenic source of embolism -continue atorvastatin -Continue aspirin, Eliquis -Atrial fibrillation: Known -tPA not given because symptoms resolved  Acute metabolic encephalopathy superimposed on  baseline cognitive impairment -Avoid sedating medicines -PT/OT  Coronary disease, secondary prevention Peripheral vascular disease, secondary prevention Hypertension Recent CABG -Continue aspirin, atorvastatin, Zetia, Isordil, famotidine, pantoprazole  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  He will need ongoing IV Lasix.   Dispo: The patient is from: SNF              Anticipated d/c is to: SNF              Anticipated d/c date is: 2 days              Patient currently is not medically stable to d/c.   Patient has ongoing severe respiratory failure, requiring trip with his normal home oxygen.  We will continue aggressive diuresis and close monitoring of electrolytes.            MDM: The below labs and imaging reports were reviewed and summarized above.  Medication management as above.  This is a severe exacerbation of his chronic disease  DVT prophylaxis: N/A on systemic anticoagulation Code Status: FULL Family Communication: Spoke with sister by phone last night    Consultants:     Procedures:     Antimicrobials:   CTX  and azithromycin x1 on 5/13   Culture data:   5/13 blood culture x2 -- ngtd           Subjective: He is interactive however still somewhat disoriented and confused.  He requires ongoing oxygen at 4 L/min to 5 L/min, and is significantly debilitated.  No fever, no respiratory distress, no vomiting or  diarrhea.     Objective: Vitals:   11/26/19 0500 11/26/19 0740 11/26/19 0804 11/26/19 1117  BP: (!) 147/75 (!) 156/77  (!) 153/70  Pulse: 94 85  82  Resp: 18 19  19   Temp: 98.3 F (36.8 C) 98.4 F (36.9 C)  (!) 97.4 F (36.3 C)  TempSrc: Oral Oral  Oral  SpO2: 97% 92% 93% 90%  Weight: 85.3 kg     Height:        Intake/Output Summary (Last 24 hours) at 11/26/2019 1326 Last data filed at 11/26/2019 1120 Gross per 24 hour  Intake --  Output 3100 ml  Net -3100 ml   Filed Weights   11/24/19 0500 11/25/19 0416 11/26/19 0500  Weight: 86.8 kg 85.7 kg 85.3 kg    Examination: General appearance: Chronically ill-appearing adult male.  Awake, appears to be watching TV, but debilitated and somewhat disoriented.     HEENT: Clear, conjunctival pink, lids and lashes normal.  Slight ptosis on the right.  He has some dried epistaxis, no nasal deformity or discharge.  He has edentulous, lips are normal, oropharynx moist, no oral lesions, hearing seems diminished. Skin: No suspicious rashes or lesions.  Pale. Cardiac: Irregularly irregular, no murmurs, JVP not visible, no lower extremity edema Respiratory: Respiratory rate is increased, extensive rales bilaterally.  No wheezing. Abdomen: Abdomen soft, no tenderness palpation or guarding.  No hepatosplenomegaly or distention. MSK: Severely decreased loss of subcutaneous muscle mass and fat. Neuro: Psychomotor slowing is obvious.  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  Extraocular movements appear intact.  He has some mild left upper extremity weakness, speech is dysarthric but at baseline Psych: Attention diminished, affect blunted, judgment and insight appear moderately impaired         Data Reviewed: I have personally reviewed following labs and imaging studies:  CBC: Recent Labs  Lab 11/20/2019 1208 11/24/19 0015 11/25/19 0520 11/26/19 0557  WBC 8.8 7.9 8.2 9.6  NEUTROABS 7.3  --   --   --   HGB 9.9* 9.6*  10.1* 9.6*  HCT 32.0* 31.1* 31.4* 29.6*  MCV 97.0 97.8 94.0 92.8  PLT 215 205 193 852   Basic Metabolic Panel: Recent Labs  Lab 11/28/2019 1208 11/24/19 0015 11/25/19 0520 11/26/19 0557  NA 139 140 141 141  K 4.5 3.7 3.9 3.6  CL 103 101 102 99  CO2 25 29 31  32  GLUCOSE 180* 259* 212* 162*  BUN 34* 34* 27* 29*  CREATININE 1.72* 1.60* 1.29* 1.23  CALCIUM 8.9 8.6* 8.7* 8.4*   GFR: Estimated Creatinine Clearance: 65.8 mL/min (by C-G formula based on SCr of 1.23 mg/dL). Liver Function Tests: Recent Labs  Lab 11/19/2019 1208 11/24/19 0015  AST 19 16  ALT 13 12  ALKPHOS 100 97  BILITOT 1.0 0.7  PROT 6.9 6.7  ALBUMIN 3.1* 2.8*   No results for input(s): LIPASE, AMYLASE in the last 168 hours. Recent Labs  Lab 12/07/2019 1951  AMMONIA <9*   Coagulation Profile: Recent Labs  Lab 11/28/2019 1208  INR 1.9*   Cardiac Enzymes: No results for input(s): CKTOTAL, CKMB, CKMBINDEX, TROPONINI in  the last 168 hours. BNP (last 3 results) No results for input(s): PROBNP in the last 8760 hours. HbA1C: Recent Labs    11/24/19 0015 11/25/19 0520  HGBA1C 5.3 5.2   CBG: Recent Labs  Lab 11/25/19 1142 11/25/19 1617 11/25/19 2118 11/26/19 0741 11/26/19 1142  GLUCAP 243* 168* 180* 147* 188*   Lipid Profile: Recent Labs    11/25/19 0520  CHOL 49  HDL 22*  LDLCALC 13  TRIG 69  CHOLHDL 2.2   Thyroid Function Tests: No results for input(s): TSH, T4TOTAL, FREET4, T3FREE, THYROIDAB in the last 72 hours. Anemia Panel: Recent Labs    12/09/2019 1951  VITAMINB12 444   Urine analysis:    Component Value Date/Time   COLORURINE YELLOW 10/25/2019 0424   APPEARANCEUR CLEAR 10/25/2019 0424   APPEARANCEUR Clear 11/06/2014 1527   LABSPEC 1.009 10/25/2019 0424   LABSPEC 1.008 11/06/2014 1527   PHURINE 6.0 10/25/2019 0424   GLUCOSEU NEGATIVE 10/25/2019 0424   GLUCOSEU Negative 11/06/2014 1527   HGBUR NEGATIVE 10/25/2019 0424   BILIRUBINUR NEGATIVE 10/25/2019 0424   BILIRUBINUR  Negative 11/06/2014 1527   KETONESUR NEGATIVE 10/25/2019 0424   PROTEINUR 100 (A) 10/25/2019 0424   NITRITE NEGATIVE 10/25/2019 0424   LEUKOCYTESUR NEGATIVE 10/25/2019 0424   LEUKOCYTESUR Negative 11/06/2014 1527   Sepsis Labs: @LABRCNTIP (procalcitonin:4,lacticacidven:4)  ) Recent Results (from the past 240 hour(s))  SARS Coronavirus 2 by RT PCR (hospital order, performed in [place] hospital lab) Nasopharyngeal Nasopharyngeal Swab     Status: None   Collection Time: 11/22/2019  2:05 PM   Specimen: Nasopharyngeal Swab  Result Value Ref Range Status   SARS Coronavirus 2 NEGATIVE NEGATIVE Final    Comment: (NOTE) SARS-CoV-2 target nucleic acids are NOT DETECTED. The SARS-CoV-2 RNA is generally detectable in upper and lower respiratory specimens during the acute phase of infection. The lowest concentration of SARS-CoV-2 viral copies this assay can detect is 250 copies / mL. A negative result does not preclude SARS-CoV-2 infection and should not be used as the sole basis for treatment or other patient management decisions.  A negative result may occur with improper specimen collection / handling, submission of specimen other than nasopharyngeal swab, presence of viral mutation(s) within the areas targeted by this assay, and inadequate number of viral copies (<250 copies / mL). A negative result must be combined with clinical observations, patient history, and epidemiological information. Fact Sheet for Patients:   StrictlyIdeas.no Fact Sheet for Healthcare Providers: BankingDealers.co.za This test is not yet approved or cleared  by the [place] FDA and has been authorized for detection and/or diagnosis of SARS-CoV-2 by FDA under an Emergency Use Authorization (EUA).  This EUA will remain in effect (meaning this test can be used) for the duration of the COVID-19 declaration under Section 564(b)(1) of the Act, 21 U.S.C. section  360bbb-3(b)(1), unless the authorization is terminated or revoked sooner. Performed at [facility], [address] [place], [place] [address]   Culture, blood (x 2)     Status: None (Preliminary result)   Collection Time: 12/02/2019  8:54 PM   Specimen: BLOOD  Result Value Ref Range Status   Specimen Description BLOOD RAC  Final   Special Requests BOTTLES DRAWN AEROBIC AND ANAEROBIC BCAV  Final   Culture   Final    NO GROWTH 3 DAYS Performed at [facility], [address] [place], [place] [address]    Report Status PENDING  Incomplete  Culture, blood (x 2)  Status: None (Preliminary result)   Collection Time: 11/18/2019  8:54 PM   Specimen: BLOOD  Result Value Ref Range Status   Specimen Description BLOOD BRH  Final   Special Requests BOTTLES DRAWN AEROBIC AND ANAEROBIC BCAV  Final   Culture   Final    NO GROWTH 3 DAYS Performed at [facility], [address], [place], [place] [address]    Report Status PENDING  Incomplete         Radiology Studies: EEG  Result Date: 11/24/2019 Alexis Goodell, MD     11/24/2019  4:44 PM ELECTROENCEPHALOGRAM REPORT Patient: Larry Terrell       Room #: 249A-AA EEG No. ID: [id number] Age: 59 y.o.        Sex: male Requesting Physician: Josecarlos Harriott Report Date:  11/24/2019       Interpreting Physician: Alexis Goodell History: Jaymon Dudek is an 59 y.o. male with altered mental status Medications: Amiodarone, Eliquis, ASA, Insulin, Lasix, Keppra, Lopressor, Lyrica, Effexor, Zetia, Lipitor Conditions of Recording:  This is a 21 channel routine scalp EEG performed with bipolar and monopolar montages arranged in accordance to the international 10/20 system of electrode placement. One channel was dedicated to EKG recording. The patient is in the awake state. Description:  The background activity is slow and poorly organized.  It consists of low voltage activity in the delta-theta continuum.  This activity is diffusely  distributed and continuous throughout the recording.  No epileptiform activity is noted.  Hyperventilation was not performed. Intermittent photic stimulation was performed but failed to illicit any change in the tracing. IMPRESSION: This is an abnormal EEG secondary to general background slowing.  This finding may be seen with a diffuse disturbance that is etiologically nonspecific, but may include a metabolic encephalopathy, among other possibilities.  No epileptiform activity was noted.  Alexis Goodell, MD Neurology [phone] 11/24/2019, 4:41 PM   DG Chest 2 View  Result Date: 11/25/2019 CLINICAL DATA:  Pleural effusion EXAM: CHEST - 2 VIEW COMPARISON:  11/21/2019 FINDINGS: Post CABG changes. Stable cardiomegaly. Progressive interstitial and alveolar opacities throughout both lungs. Small to moderate bilateral pleural effusions. No pneumothorax. IMPRESSION: Progressive bilateral interstitial and alveolar opacities, which may reflect pulmonary edema versus multifocal pneumonia. Small to moderate bilateral pleural effusions. Electronically Signed   By: Davina Poke D.O.   On: 11/25/2019 17:02   US Carotid Bilateral (at [facility] and [facility] only)  Result Date: 11/24/2019 CLINICAL DATA:  59 year old male with stroke-like symptoms EXAM: BILATERAL CAROTID DUPLEX ULTRASOUND TECHNIQUE: Pearline Cables scale imaging, color Doppler and duplex ultrasound were performed of bilateral carotid and vertebral arteries in the neck. COMPARISON:  Prior duplex carotid ultrasound 11/06/2014 FINDINGS: Criteria: Quantification of carotid stenosis is based on velocity parameters that correlate the residual internal carotid diameter with NASCET-based stenosis levels, using the diameter of the distal internal carotid lumen as the denominator for stenosis measurement. The following velocity measurements were obtained: RIGHT ICA: 70/28 cm/sec CCA: 77/93 cm/sec SYSTOLIC ICA/CCA RATIO:  0.8 ECA:  142 cm/sec LEFT ICA: 53/23 cm/sec CCA: 903/00 cm/sec  SYSTOLIC ICA/CCA RATIO:  0.5 ECA:  188 cm/sec RIGHT CAROTID ARTERY: Mild heterogeneous atherosclerotic plaque in the proximal internal carotid artery. By peak systolic velocity criteria, the estimated stenosis is less than 50%. RIGHT VERTEBRAL ARTERY:  Patent with antegrade flow. LEFT CAROTID ARTERY: Mild heterogeneous atherosclerotic plaque in the proximal internal carotid artery. By peak systolic velocity criteria, the estimated stenosis is less than 50%. LEFT VERTEBRAL ARTERY:  Patent with antegrade flow.  IMPRESSION: 1. Mild (1-49%) stenosis proximal right internal carotid artery secondary to mild heterogeneous atherosclerotic plaque. 2. Mild (1-49%) stenosis proximal left internal carotid artery secondary to mild heterogeneous atherosclerotic plaque. 3. The vertebral arteries are patent with normal antegrade flow. Signed, Criselda Peaches, MD, [facility] Radiology Electronically Signed   By: Jacqulynn Cadet M.D.   On: 11/24/2019 15:37   ECHOCARDIOGRAM COMPLETE  Result Date: 11/24/2019    ECHOCARDIOGRAM REPORT   Patient Name:   [facility] LEE Stefanko Date of Exam: 11/24/2019 Medical Rec #:  [id number]       Height:       67.0 in Accession #:    [id number]      Weight:       191.4 lb Date of Birth:  03/27/1961        BSA:          1.985 m Patient Age:    47 years        BP:           152/75 mmHg Patient Gender: M               HR:           67 bpm. Exam Location:  [facility] Procedure: 2D Echo, Cardiac Doppler and Color Doppler Indications:     Stroke 434.91  History:         Patient has prior history of Echocardiogram examinations, most                  recent 10/26/2019. TIA and COPD; Risk Factors:Diabetes and Sleep                  Apnea.  Sonographer:     Sherrie Sport RDCS (AE) Referring Phys:  [id number] Suann Larry Anetta Olvera Diagnosing Phys: Ida Rogue MD  Sonographer Comments: Suboptimal apical window. IMPRESSIONS  1. Left ventricular ejection fraction, by  estimation, is 55 to 60%. The left ventricle has normal function. The left ventricle has no regional wall motion abnormalities. There is mild left ventricular hypertrophy. Left ventricular diastolic parameters are indeterminate.  2. Right ventricular systolic function is normal. The right ventricular size is normal. There is normal pulmonary artery systolic pressure.  3. Left atrial size was moderately dilated.  4. Mild mitral valve regurgitation.  5. Images suggestive of left pleural effusion FINDINGS  Left Ventricle: Left ventricular ejection fraction, by estimation, is 55 to 60%. The left ventricle has normal function. The left ventricle has no regional wall motion abnormalities. The left ventricular internal cavity size was normal in size. There is  mild left ventricular hypertrophy. Left ventricular diastolic parameters are indeterminate. Right Ventricle: The right ventricular size is normal. No increase in right ventricular wall thickness. Right ventricular systolic function is normal. There is normal pulmonary artery systolic pressure. The tricuspid regurgitant velocity is 2.44 m/s, and  with an assumed right atrial pressure of 10 mmHg, the estimated right ventricular systolic pressure is 38.1 mmHg. Left Atrium: Left atrial size was moderately dilated. Right Atrium: Right atrial size was normal in size. Pericardium: A small pericardial effusion is present. Mitral Valve: The mitral valve is normal in structure. Normal mobility of the mitral valve leaflets. Mild mitral valve regurgitation. No evidence of mitral valve stenosis. Tricuspid Valve: The tricuspid valve is normal in structure. Tricuspid valve regurgitation is not demonstrated. No evidence of tricuspid stenosis. Aortic Valve: The aortic valve is normal in structure. Aortic valve regurgitation is not visualized.  No aortic stenosis is present. Aortic valve mean gradient measures 5.7 mmHg. Aortic valve peak gradient measures 11.4 mmHg. Aortic valve area,  by VTI measures 1.68 cm. Pulmonic Valve: The pulmonic valve was normal in structure. Pulmonic valve regurgitation is not visualized. No evidence of pulmonic stenosis. Aorta: The aortic root is normal in size and structure. Venous: The inferior vena cava is normal in size with greater than 50% respiratory variability, suggesting right atrial pressure of 3 mmHg. IAS/Shunts: No atrial level shunt detected by color flow Doppler.  LEFT VENTRICLE PLAX 2D LVIDd:         3.38 cm LVIDs:         2.28 cm LV PW:         1.19 cm LV IVS:        0.97 cm LVOT diam:     2.00 cm LV SV:         50 LV SV Index:   25 LVOT Area:     3.14 cm  RIGHT VENTRICLE RV Basal diam:  3.36 cm RV S prime:     10.00 cm/s TAPSE (M-mode): 3.2 cm LEFT ATRIUM             Index       RIGHT ATRIUM           Index LA diam:        5.30 cm 2.67 cm/m  RA Area:     21.10 cm LA Vol (A2C):   77.4 ml 39.00 ml/m RA Volume:   61.70 ml  31.09 ml/m LA Vol (A4C):   82.1 ml 41.37 ml/m LA Biplane Vol: 80.1 ml 40.36 ml/m  AORTIC VALVE                    PULMONIC VALVE AV Area (Vmax):    1.88 cm     PV Vmax:        0.69 m/s AV Area (Vmean):   1.84 cm     PV Peak grad:   1.9 mmHg AV Area (VTI):     1.68 cm     RVOT Peak grad: 2 mmHg AV Vmax:           168.67 cm/s AV Vmean:          111.333 cm/s AV VTI:            0.299 m AV Peak Grad:      11.4 mmHg AV Mean Grad:      5.7 mmHg LVOT Vmax:         101.00 cm/s LVOT Vmean:        65.200 cm/s LVOT VTI:          0.160 m LVOT/AV VTI ratio: 0.53  AORTA Ao Root diam: 2.90 cm MITRAL VALVE                TRICUSPID VALVE MV Area (PHT): 4.77 cm     TR Peak grad:   23.8 mmHg MV Decel Time: 159 msec     TR Vmax:        244.00 cm/s MV E velocity: 131.00 cm/s                             SHUNTS                             Systemic VTI:  0.16 m  Systemic Diam: 2.00 cm Ida Rogue MD Electronically signed by Ida Rogue MD Signature Date/Time: 11/24/2019/2:50:13 PM    Final         Scheduled  Meds:   stroke: mapping our early stages of recovery book   Does not apply Once   amiodarone  200 mg Oral Daily   apixaban  5 mg Oral BID   aspirin  81 mg Oral Daily   atorvastatin  80 mg Oral QPM   ezetimibe  10 mg Oral Daily   famotidine  20 mg Oral BID   furosemide  80 mg Intravenous BID   insulin aspart  0-15 Units Subcutaneous TID WC   insulin aspart  0-5 Units Subcutaneous QHS   insulin glargine  5 Units Subcutaneous Daily   levETIRAcetam  500 mg Oral TID   mouth rinse  15 mL Mouth Rinse BID   metoprolol tartrate  37.5 mg Oral BID   pantoprazole  40 mg Oral Daily   pregabalin  50 mg Oral TID   venlafaxine  75 mg Oral TID   Continuous Infusions:    LOS: 3 days    Time spent: 25 minutes    Edwin Dada, MD [facility] 11/26/2019, 1:26 PM     Please page though [place] or Epic secure chat:  For [employer], Adult nurse

## 2019-11-26 NOTE — Plan of Care (Signed)
  Problem: Activity: Goal: Risk for activity intolerance will decrease Outcome: Progressing   Problem: Education: Goal: Knowledge of disease or condition will improve Outcome: Progressing Goal: Knowledge of secondary prevention will improve Outcome: Progressing Goal: Knowledge of patient specific risk factors addressed and post discharge goals established will improve Outcome: Progressing   Problem: Education: Goal: Knowledge of secondary prevention will improve Outcome: Progressing

## 2019-11-26 NOTE — Progress Notes (Signed)
Progress Note  Patient Name: Larry Terrell Date of Encounter: 11/26/2019  Primary Cardiologist: Ida Rogue, MD   Subjective   No complaints, denies shortness of breath, no chest pain Laying supine without any distress   Inpatient Medications    Scheduled Meds: .  stroke: mapping our early stages of recovery book   Does not apply Once  . amiodarone  200 mg Oral Daily  . apixaban  5 mg Oral BID  . aspirin  81 mg Oral Daily  . atorvastatin  80 mg Oral QPM  . ezetimibe  10 mg Oral Daily  . famotidine  20 mg Oral BID  . furosemide  80 mg Intravenous Q8H  . insulin aspart  0-15 Units Subcutaneous TID WC  . insulin aspart  0-5 Units Subcutaneous QHS  . insulin glargine  5 Units Subcutaneous Daily  . levETIRAcetam  500 mg Oral TID  . mouth rinse  15 mL Mouth Rinse BID  . metoprolol tartrate  37.5 mg Oral BID  . pantoprazole  40 mg Oral Daily  . potassium chloride  20 mEq Oral Daily  . pregabalin  50 mg Oral TID  . venlafaxine  75 mg Oral TID   Continuous Infusions:  PRN Meds: acetaminophen **OR** acetaminophen, ondansetron **OR** ondansetron (ZOFRAN) IV   Vital Signs    Vitals:   11/26/19 0500 11/26/19 0740 11/26/19 0804 11/26/19 1117  BP: (!) 147/75 (!) 156/77  (!) 153/70  Pulse: 94 85  82  Resp: 18 19  19   Temp: 98.3 F (36.8 C) 98.4 F (36.9 C)  (!) 97.4 F (36.3 C)  TempSrc: Oral Oral  Oral  SpO2: 97% 92% 93% 90%  Weight: 85.3 kg     Height:        Intake/Output Summary (Last 24 hours) at 11/26/2019 1411 Last data filed at 11/26/2019 1120 Gross per 24 hour  Intake --  Output 3100 ml  Net -3100 ml   Last 3 Weights 11/26/2019 11/25/2019 11/24/2019  Weight (lbs) 188 lb 1.2 oz 189 lb 191 lb 5.8 oz  Weight (kg) 85.31 kg 85.73 kg 86.8 kg      Telemetry    Atrial fibrillation- Personally Reviewed  ECG     - Personally Reviewed  Physical Exam   Constitutional: Alert, some confusion HENT:  Head: Grossly normal Eyes:  no discharge. No scleral  icterus.  Neck: No JVD, no carotid bruits  Cardiovascular: Irregularly irregular, no murmurs appreciated Pulmonary/Chest: Clear to auscultation bilaterally, no wheezes or rails Abdominal: Soft.  no distension.  no tenderness.  Musculoskeletal: Normal range of motion Neurological:  normal muscle tone. Coordination normal. No atrophy Skin: Skin warm and dry Psychiatric: normal affect, pleasant   Labs    High Sensitivity Troponin:   Recent Labs  Lab 12/11/2019 1208 11/12/2019 1405  TROPONINIHS 10 10      Chemistry Recent Labs  Lab 12/11/2019 1208 11/22/2019 1208 11/24/19 0015 11/25/19 0520 11/26/19 0557  NA 139   < > 140 141 141  K 4.5   < > 3.7 3.9 3.6  CL 103   < > 101 102 99  CO2 25   < > 29 31 32  GLUCOSE 180*   < > 259* 212* 162*  BUN 34*   < > 34* 27* 29*  CREATININE 1.72*   < > 1.60* 1.29* 1.23  CALCIUM 8.9   < > 8.6* 8.7* 8.4*  PROT 6.9  --  6.7  --   --  ALBUMIN 3.1*  --  2.8*  --   --   AST 19  --  16  --   --   ALT 13  --  12  --   --   ALKPHOS 100  --  97  --   --   BILITOT 1.0  --  0.7  --   --   GFRNONAA 42*   < > 46* 59* >60  GFRAA 49*   < > 53* >60 >60  ANIONGAP 11   < > 10 8 10    < > = values in this interval not displayed.     Hematology Recent Labs  Lab 11/24/19 0015 11/25/19 0520 11/26/19 0557  WBC 7.9 8.2 9.6  RBC 3.18* 3.34* 3.19*  HGB 9.6* 10.1* 9.6*  HCT 31.1* 31.4* 29.6*  MCV 97.8 94.0 92.8  MCH 30.2 30.2 30.1  MCHC 30.9 32.2 32.4  RDW 19.6* 19.3* 19.1*  PLT 205 193 173    BNP Recent Labs  Lab 12/02/2019 1208  BNP 273.0*     DDimer No results for input(s): DDIMER in the last 168 hours.   Radiology    EEG  Result Date: 11/24/2019 Alexis Goodell, MD     11/24/2019  4:44 PM ELECTROENCEPHALOGRAM REPORT Patient: Cire Clute       Room #: 249A-AA EEG No. ID: [id number] Age: 59 y.o.        Sex: male Requesting Physician: Danford Report Date:  11/24/2019       Interpreting Physician: Alexis Goodell History: Dashan Chizmar is an 59  y.o. male with altered mental status Medications: Amiodarone, Eliquis, ASA, Insulin, Lasix, Keppra, Lopressor, Lyrica, Effexor, Zetia, Lipitor Conditions of Recording:  This is a 21 channel routine scalp EEG performed with bipolar and monopolar montages arranged in accordance to the international 10/20 system of electrode placement. One channel was dedicated to EKG recording. The patient is in the awake state. Description:  The background activity is slow and poorly organized.  It consists of low voltage activity in the delta-theta continuum.  This activity is diffusely distributed and continuous throughout the recording.  No epileptiform activity is noted.  Hyperventilation was not performed. Intermittent photic stimulation was performed but failed to illicit any change in the tracing. IMPRESSION: This is an abnormal EEG secondary to general background slowing.  This finding may be seen with a diffuse disturbance that is etiologically nonspecific, but may include a metabolic encephalopathy, among other possibilities.  No epileptiform activity was noted.  Alexis Goodell, MD Neurology [phone] 11/24/2019, 4:41 PM   DG Chest 2 View  Result Date: 11/25/2019 CLINICAL DATA:  Pleural effusion EXAM: CHEST - 2 VIEW COMPARISON:  12/11/2019 FINDINGS: Post CABG changes. Stable cardiomegaly. Progressive interstitial and alveolar opacities throughout both lungs. Small to moderate bilateral pleural effusions. No pneumothorax. IMPRESSION: Progressive bilateral interstitial and alveolar opacities, which may reflect pulmonary edema versus multifocal pneumonia. Small to moderate bilateral pleural effusions. Electronically Signed   By: Davina Poke D.O.   On: 11/25/2019 17:02   US Carotid Bilateral (at [facility] and [facility] only)  Result Date: 11/24/2019 CLINICAL DATA:  59 year old male with stroke-like symptoms EXAM: BILATERAL CAROTID DUPLEX ULTRASOUND TECHNIQUE: Pearline Cables scale imaging, color Doppler and duplex ultrasound were  performed of bilateral carotid and vertebral arteries in the neck. COMPARISON:  Prior duplex carotid ultrasound 11/06/2014 FINDINGS: Criteria: Quantification of carotid stenosis is based on velocity parameters that correlate the residual internal carotid diameter with NASCET-based stenosis levels, using the diameter of the  distal internal carotid lumen as the denominator for stenosis measurement. The following velocity measurements were obtained: RIGHT ICA: 70/28 cm/sec CCA: 70/26 cm/sec SYSTOLIC ICA/CCA RATIO:  0.8 ECA:  142 cm/sec LEFT ICA: 53/23 cm/sec CCA: 378/58 cm/sec SYSTOLIC ICA/CCA RATIO:  0.5 ECA:  188 cm/sec RIGHT CAROTID ARTERY: Mild heterogeneous atherosclerotic plaque in the proximal internal carotid artery. By peak systolic velocity criteria, the estimated stenosis is less than 50%. RIGHT VERTEBRAL ARTERY:  Patent with antegrade flow. LEFT CAROTID ARTERY: Mild heterogeneous atherosclerotic plaque in the proximal internal carotid artery. By peak systolic velocity criteria, the estimated stenosis is less than 50%. LEFT VERTEBRAL ARTERY:  Patent with antegrade flow. IMPRESSION: 1. Mild (1-49%) stenosis proximal right internal carotid artery secondary to mild heterogeneous atherosclerotic plaque. 2. Mild (1-49%) stenosis proximal left internal carotid artery secondary to mild heterogeneous atherosclerotic plaque. 3. The vertebral arteries are patent with normal antegrade flow. Signed, Criselda Peaches, MD, [facility] Radiology Electronically Signed   By: Jacqulynn Cadet M.D.   On: 11/24/2019 15:37   ECHOCARDIOGRAM COMPLETE  Result Date: 11/24/2019    ECHOCARDIOGRAM REPORT   Patient Name:   [facility] LEE Shiner Date of Exam: 11/24/2019 Medical Rec #:  [id number]       Height:       67.0 in Accession #:    [id number]      Weight:       191.4 lb Date of Birth:  08-May-1961        BSA:          1.985 m Patient Age:    36 years        BP:           152/75  mmHg Patient Gender: M               HR:           67 bpm. Exam Location:  [facility] Procedure: 2D Echo, Cardiac Doppler and Color Doppler Indications:     Stroke 434.91  History:         Patient has prior history of Echocardiogram examinations, most                  recent 10/26/2019. TIA and COPD; Risk Factors:Diabetes and Sleep                  Apnea.  Sonographer:     Sherrie Sport RDCS (AE) Referring Phys:  [id number] Suann Larry DANFORD Diagnosing Phys: Ida Rogue MD  Sonographer Comments: Suboptimal apical window. IMPRESSIONS  1. Left ventricular ejection fraction, by estimation, is 55 to 60%. The left ventricle has normal function. The left ventricle has no regional wall motion abnormalities. There is mild left ventricular hypertrophy. Left ventricular diastolic parameters are indeterminate.  2. Right ventricular systolic function is normal. The right ventricular size is normal. There is normal pulmonary artery systolic pressure.  3. Left atrial size was moderately dilated.  4. Mild mitral valve regurgitation.  5. Images suggestive of left pleural effusion FINDINGS  Left Ventricle: Left ventricular ejection fraction, by estimation, is 55 to 60%. The left ventricle has normal function. The left ventricle has no regional wall motion abnormalities. The left ventricular internal cavity size was normal in size. There is  mild left ventricular hypertrophy. Left ventricular diastolic parameters are indeterminate. Right Ventricle: The right ventricular size is normal. No increase in right ventricular wall thickness. Right ventricular systolic function is normal. There is normal pulmonary artery  systolic pressure. The tricuspid regurgitant velocity is 2.44 m/s, and  with an assumed right atrial pressure of 10 mmHg, the estimated right ventricular systolic pressure is 73.4 mmHg. Left Atrium: Left atrial size was moderately dilated. Right Atrium: Right atrial size was normal in size. Pericardium: A small pericardial effusion  is present. Mitral Valve: The mitral valve is normal in structure. Normal mobility of the mitral valve leaflets. Mild mitral valve regurgitation. No evidence of mitral valve stenosis. Tricuspid Valve: The tricuspid valve is normal in structure. Tricuspid valve regurgitation is not demonstrated. No evidence of tricuspid stenosis. Aortic Valve: The aortic valve is normal in structure. Aortic valve regurgitation is not visualized. No aortic stenosis is present. Aortic valve mean gradient measures 5.7 mmHg. Aortic valve peak gradient measures 11.4 mmHg. Aortic valve area, by VTI measures 1.68 cm. Pulmonic Valve: The pulmonic valve was normal in structure. Pulmonic valve regurgitation is not visualized. No evidence of pulmonic stenosis. Aorta: The aortic root is normal in size and structure. Venous: The inferior vena cava is normal in size with greater than 50% respiratory variability, suggesting right atrial pressure of 3 mmHg. IAS/Shunts: No atrial level shunt detected by color flow Doppler.  LEFT VENTRICLE PLAX 2D LVIDd:         3.38 cm LVIDs:         2.28 cm LV PW:         1.19 cm LV IVS:        0.97 cm LVOT diam:     2.00 cm LV SV:         50 LV SV Index:   25 LVOT Area:     3.14 cm  RIGHT VENTRICLE RV Basal diam:  3.36 cm RV S prime:     10.00 cm/s TAPSE (M-mode): 3.2 cm LEFT ATRIUM             Index       RIGHT ATRIUM           Index LA diam:        5.30 cm 2.67 cm/m  RA Area:     21.10 cm LA Vol (A2C):   77.4 ml 39.00 ml/m RA Volume:   61.70 ml  31.09 ml/m LA Vol (A4C):   82.1 ml 41.37 ml/m LA Biplane Vol: 80.1 ml 40.36 ml/m  AORTIC VALVE                    PULMONIC VALVE AV Area (Vmax):    1.88 cm     PV Vmax:        0.69 m/s AV Area (Vmean):   1.84 cm     PV Peak grad:   1.9 mmHg AV Area (VTI):     1.68 cm     RVOT Peak grad: 2 mmHg AV Vmax:           168.67 cm/s AV Vmean:          111.333 cm/s AV VTI:            0.299 m AV Peak Grad:      11.4 mmHg AV Mean Grad:      5.7 mmHg LVOT Vmax:          101.00 cm/s LVOT Vmean:        65.200 cm/s LVOT VTI:          0.160 m LVOT/AV VTI ratio: 0.53  AORTA Ao Root diam: 2.90 cm MITRAL VALVE  TRICUSPID VALVE MV Area (PHT): 4.77 cm     TR Peak grad:   23.8 mmHg MV Decel Time: 159 msec     TR Vmax:        244.00 cm/s MV E velocity: 131.00 cm/s                             SHUNTS                             Systemic VTI:  0.16 m                             Systemic Diam: 2.00 cm Ida Rogue MD Electronically signed by Ida Rogue MD Signature Date/Time: 11/24/2019/2:50:13 PM    Final     Cardiac Studies   Echocardiogram 1. Left ventricular ejection fraction, by estimation, is 55 to 60%. The  left ventricle has normal function. The left ventricle has no regional  wall motion abnormalities. There is mild left ventricular hypertrophy.  Left ventricular diastolic parameters  are indeterminate.  2. Right ventricular systolic function is normal. The right ventricular  size is normal. There is normal pulmonary artery systolic pressure.  3. Left atrial size was moderately dilated.  4. Mild mitral valve regurgitation.   Patient Profile     59 year old gentleman with history of coronary artery disease, prior PCI, recent CABG April 2021, postoperative atrial fibrillation, PAD, history of arterial bifemoral bypass 2013, diabetes type 2 COPD on 3 L nasal cannula oxygen at home, prior smoker stopped 10 years ago, presented with worsening shortness of breath  Assessment & Plan    A/P: Acute respiratory distress Acute on chronic diastolic CHF, anemia, COPD, long smoking history, unable to exclude pleural effusion -Cardiorenal syndrome, renal function has been improving with diuresis now -5 L or more -Appears to be more comfortable, likely close to his baseline -Would continue IV Lasix twice daily with close monitoring of renal function, likely can be transitioned to oral diuresis in the next day or 2 -He was not on Lasix as an outpatient,  had 7 days following CABG. in the setting of atrial fibrillation would likely need Lasix 40 daily  Acute stroke Notes indicating baseline level is poor, chronic mild confusion CT head, MRI brain with stroke this admission Would continue aspirin with Eliquis Followed by neurology  Atrial fibrillation, persistent Developed postoperatively following CABG April 2021 despite treatment with IV amiodarone as inpatient Treated with oral amiodarone through April, also Eliquis 5 twice daily Likely contributing to diastolic heart failure symptoms -Consider cardioversion at some point, Given some confusion, possible encephalopathy, may not want to do general anesthesia at this time  Confusion Issues at baseline even prior to surgery per family Confusion in the hospital following CABG Baseline appears poor Given asymptomatic from his atrial fibrillation would likely hold off on cardioversion until later date  CAD with stable angina, CABG Normal ejection fraction on echo ,no new focal wall motion abnormalities Troponin negative  Acute on chronic renal failure  improving with diuresis consistent with cardiorenal syndrome -Continue Lasix as above Would look to transition from IV Lasix twice daily to oral 40 daily in the next day or so  COPD Long smoking history, on 3 L nasal cannula oxygen at home Weaning oxygen with diuresis    Total encounter time more than 25 minutes  Greater than 50% was spent in counseling and coordination of care with the patient   For questions or updates, please contact [place] Please consult [email or web address] for contact info under        Signed, Ida Rogue, MD  11/26/2019, 2:11 PM

## 2019-11-27 LAB — CBC
HCT: 30.8 % — ABNORMAL LOW (ref 39.0–52.0)
Hemoglobin: 9.7 g/dL — ABNORMAL LOW (ref 13.0–17.0)
MCH: 29.7 pg (ref 26.0–34.0)
MCHC: 31.5 g/dL (ref 30.0–36.0)
MCV: 94.2 fL (ref 80.0–100.0)
Platelets: 197 10*3/uL (ref 150–400)
RBC: 3.27 MIL/uL — ABNORMAL LOW (ref 4.22–5.81)
RDW: 18.8 % — ABNORMAL HIGH (ref 11.5–15.5)
WBC: 8.1 10*3/uL (ref 4.0–10.5)
nRBC: 0 % (ref 0.0–0.2)

## 2019-11-27 LAB — BASIC METABOLIC PANEL
Anion gap: 12 (ref 5–15)
BUN: 29 mg/dL — ABNORMAL HIGH (ref 8–23)
CO2: 33 mmol/L — ABNORMAL HIGH (ref 22–32)
Calcium: 8.7 mg/dL — ABNORMAL LOW (ref 8.9–10.3)
Chloride: 95 mmol/L — ABNORMAL LOW (ref 98–111)
Creatinine, Ser: 1.26 mg/dL — ABNORMAL HIGH (ref 0.61–1.24)
GFR calc Af Amer: >60 mL/min (ref >60)
GFR calc non Af Amer: >60 mL/min (ref >60)
Glucose, Bld: 145 mg/dL — ABNORMAL HIGH (ref 70–99)
Potassium: 3.4 mmol/L — ABNORMAL LOW (ref 3.5–5.1)
Sodium: 140 mmol/L (ref 135–145)

## 2019-11-27 LAB — GLUCOSE, CAPILLARY
Glucose-Capillary: 165 mg/dL — ABNORMAL HIGH (ref 70–99)
Glucose-Capillary: 244 mg/dL — ABNORMAL HIGH (ref 70–99)
Glucose-Capillary: 268 mg/dL — ABNORMAL HIGH (ref 70–99)
Glucose-Capillary: 187 mg/dL — ABNORMAL HIGH (ref 70–99)

## 2019-11-27 MED ORDER — FUROSEMIDE 10 MG/ML IJ SOLN
80.0000 mg | Freq: Two times a day (BID) | INTRAMUSCULAR | Status: AC
  Administered 2019-11-27: 80 mg via INTRAVENOUS
  Filled 2019-11-27: qty 8

## 2019-11-27 MED ORDER — ENSURE MAX PROTEIN PO LIQD
11.0000 [oz_av] | Freq: Two times a day (BID) | ORAL | Status: DC
  Administered 2019-11-28 – 2019-11-30 (×2): 11 [oz_av] via ORAL
  Filled 2019-11-27: qty 330

## 2019-11-27 MED ORDER — POTASSIUM CHLORIDE CRYS ER 20 MEQ PO TBCR
40.0000 meq | EXTENDED_RELEASE_TABLET | Freq: Every day | ORAL | Status: DC
  Administered 2019-11-27 – 2019-11-30 (×4): 40 meq via ORAL
  Filled 2019-11-27 (×4): qty 2

## 2019-11-27 MED ORDER — FUROSEMIDE 40 MG PO TABS
40.0000 mg | ORAL_TABLET | Freq: Two times a day (BID) | ORAL | Status: DC
  Administered 2019-11-28: 40 mg via ORAL
  Filled 2019-11-27: qty 1

## 2019-11-27 NOTE — Addendum Note (Signed)
Addended by: Raelene Bott, Fount Bahe L on: 11/27/2019 07:46 AM   Modules accepted: Orders

## 2019-11-27 NOTE — Progress Notes (Signed)
PT Cancellation Note  Patient Details Name: Linley Moxley MRN: [id number] DOB: 01/24/1961   Cancelled Treatment:    Reason Eval/Treat Not Completed: Fatigue/lethargy limiting ability to participate.  Chart reviewed.  Pt sleeping in bed upon PT arrival; breakfast tray in front of pt but not eaten.  Pt able to wake with vc's and tactile cues multiple times but pt unable to stay away or keep eyes open with cueing each time (unable to keep pt awake/alert to participate in therapy or to eat breakfast).  Nurse notified (reports pt refused CPAP last night) and nurse went to check on pt.  Will re-attempt PT treatment session at a later date/time.  Leitha Bleak, PT 11/27/19, 9:59 AM

## 2019-11-27 NOTE — Progress Notes (Signed)
Per Dr. Loleta Books, make sure patient is weaned to be 88-94%.  Patient currently on [facility] with sats at 90%.  Patient is unable to tell me how much oxygen he wears at home.

## 2019-11-27 NOTE — NC FL2 (Signed)
[place] LEVEL OF CARE SCREENING TOOL     IDENTIFICATION  Patient Name: Larry Terrell Birthdate: 12-19-1960 Sex: male Admission Date (Current Location): 12/08/2019  [place] and [place] Number:  Engineering geologist and Address:  [facility], [address] [place], [place] [address]      Provider Number: [id number]  Attending Physician Name and Address:  Edwin Dada, *  Relative Name and Phone Number:  Larry Terrell [phone]    Current Level of Care: Hospital Recommended Level of Care: [place] Prior Approval Number:    Date Approved/Denied:   PASRR Number: [id number]  Discharge Plan: SNF    Current Diagnoses: Patient Active Problem List   Diagnosis Date Noted  . Acute on chronic diastolic CHF (congestive heart failure) ([place]) 11/26/2019  . Encephalopathy 11/26/2019  . Acute on chronic combined systolic and diastolic CHF (congestive heart failure) ([place]) 11/24/2019  . Open harvest of left radial artery 10/27/2019  . S/P CABG x 5   . CAD in native artery 10/20/2019  . Unstable angina ([place]) 10/20/2019  . (HFpEF) heart failure with preserved ejection fraction ([place]) 10/20/2019  . Essential hypertension 10/20/2019  . Hyperlipidemia LDL goal <70 10/20/2019  . PAD (peripheral artery disease) ([place]) 10/20/2019  . Chest pain 10/18/2019  . Altered mental status 06/04/2019  . Sepsis ([place]) 06/04/2019  . AKI (acute kidney injury) ([place]) 06/04/2019  . GERD (gastroesophageal reflux disease) 06/04/2019  . History of seizure 06/04/2019  . Stroke ([place]) 04/01/2019  . Acute appendicitis 07/10/2015  . S/P laparoscopic appendectomy 07/10/2015  . Appendicitis, acute   . Diabetes mellitus with complication ([place])   . Chronic congestive heart failure with left ventricular diastolic dysfunction ([place])   . Lactic acidosis   . Acute respiratory failure with hypoxia ([facility])     Orientation RESPIRATION BLADDER Height & Weight      Self, Situation, Place  O2(1.5 LNC) Continent Weight: 85.8 kg Height:  5' 7"  (170.2 cm)  BEHAVIORAL SYMPTOMS/MOOD NEUROLOGICAL BOWEL NUTRITION STATUS      Continent    AMBULATORY STATUS COMMUNICATION OF NEEDS Skin   Extensive Assist Verbally                         Personal Care Assistance Level of Assistance  Bathing, Feeding, Dressing Bathing Assistance: Limited assistance Feeding assistance: Independent Dressing Assistance: Limited assistance     Functional Limitations Info  Sight, Hearing, Speech Sight Info: Impaired Hearing Info: Adequate Speech Info: Adequate    SPECIAL CARE FACTORS FREQUENCY  PT (By licensed PT), OT (By licensed OT)     PT Frequency: 5x weekly OT Frequency: 5x weekly            Contractures      Additional Factors Info  Code Status, Allergies Code Status Info: Full code Allergies Info: Gabapentin, Ace Inhibitors           Current Medications (11/27/2019):  This is the current hospital active medication list Current Facility-Administered Medications  Medication Dose Route Frequency Provider Last Rate Last Admin  .  stroke: mapping our early stages of recovery book   Does not apply Once Danford, Suann Larry, MD      . acetaminophen (TYLENOL) tablet 650 mg  650 mg Oral Q6H PRN Edwin Dada, MD   650 mg at 11/25/19 2774   Or  . acetaminophen (TYLENOL) suppository 650 mg  650 mg Rectal Q6H PRN Danford, Suann Larry, MD      .  amiodarone (PACERONE) tablet 200 mg  200 mg Oral Daily Edwin Dada, MD   200 mg at 11/27/19 0802  . apixaban (ELIQUIS) tablet 5 mg  5 mg Oral BID Edwin Dada, MD   5 mg at 11/27/19 0802  . aspirin chewable tablet 81 mg  81 mg Oral Daily Edwin Dada, MD   81 mg at 11/27/19 0802  . atorvastatin (LIPITOR) tablet 80 mg  80 mg Oral QPM Edwin Dada, MD   80 mg at 11/26/19 1715  . ezetimibe (ZETIA) tablet 10 mg  10 mg Oral Daily Edwin Dada, MD   10 mg  at 11/27/19 0802  . famotidine (PEPCID) tablet 20 mg  20 mg Oral BID Edwin Dada, MD   20 mg at 11/27/19 0802  . furosemide (LASIX) injection 80 mg  80 mg Intravenous BID Danford, Christopher P, MD      . insulin aspart (novoLOG) injection 0-15 Units  0-15 Units Subcutaneous TID WC Edwin Dada, MD   3 Units at 11/27/19 0803  . insulin aspart (novoLOG) injection 0-5 Units  0-5 Units Subcutaneous QHS Danford, Christopher P, MD      . insulin glargine (LANTUS) injection 5 Units  5 Units Subcutaneous Daily Danford, Suann Larry, MD   5 Units at 11/27/19 0804  . levETIRAcetam (KEPPRA) tablet 500 mg  500 mg Oral TID Edwin Dada, MD   500 mg at 11/27/19 0803  . MEDLINE mouth rinse  15 mL Mouth Rinse BID Edwin Dada, MD   15 mL at 11/27/19 0804  . metoprolol tartrate (LOPRESSOR) tablet 37.5 mg  37.5 mg Oral BID Edwin Dada, MD   37.5 mg at 11/27/19 0803  . ondansetron (ZOFRAN) tablet 4 mg  4 mg Oral Q6H PRN Danford, Suann Larry, MD       Or  . ondansetron (ZOFRAN) injection 4 mg  4 mg Intravenous Q6H PRN Danford, Suann Larry, MD      . pantoprazole (PROTONIX) EC tablet 40 mg  40 mg Oral Daily Danford, Suann Larry, MD   40 mg at 11/27/19 0802  . potassium chloride SA (KLOR-CON) CR tablet 40 mEq  40 mEq Oral Daily Edwin Dada, MD   40 mEq at 11/27/19 0802  . pregabalin (LYRICA) capsule 50 mg  50 mg Oral TID Edwin Dada, MD   50 mg at 11/27/19 0803  . venlafaxine (EFFEXOR) tablet 75 mg  75 mg Oral TID Edwin Dada, MD   75 mg at 11/27/19 0803     Discharge Medications: Please see discharge summary for a list of discharge medications.  Relevant Imaging Results:  Relevant Lab Results:   Additional Information [id number]  Victorino Dike, RN

## 2019-11-27 NOTE — Progress Notes (Signed)
Nutrition Follow Up Note   DOCUMENTATION CODES:   Not applicable  INTERVENTION:   Ensure Max protein supplement BID, each supplement provides 150kcal and 30g of protein.  NUTRITION DIAGNOSIS:   Increased nutrient needs related to chronic illness(CHF, COPD) as evidenced by increased estimated needs.  GOAL:   Patient will meet greater than or equal to 90% of their needs  -progressing   MONITOR:   PO intake, Supplement acceptance, Labs, Weight trends, Skin, I & O's  ASSESSMENT:   59 y.o. M with hx CAD s/p PCI x2 2015 and CABG x5 last month at [facility], postop A. fib on amiodarone and Eliquis, COPD on 2 L home O2, depression, chronic pain, DM, HTN, CHF EF 45%, obesity, PVD status post aortofemoral bypass, CKD 3B and NASH who presents with fall. Pt found to have aute left posterior stroke, likely embolic per MD note  Pt eating 100% of meals in hospital. RD will add supplements to help pt meet his estimated needs. Per chart, pt down 9lbs since admit; this is likely r/t fluid changes.   Medications reviewed and include: aspirin, pepcid, lasix, insulin, protonix, KCl  Labs reviewed: K 3.4(L), BUN 29(H), creat 1.26(H) Hgb 9.7(L), Hct 30.8(L) cbgs- 165, 244 x 24 hrs  Diet Order:   Diet Order            Diet heart healthy/carb modified Room service appropriate? Yes with Assist; Fluid consistency: Thin  Diet effective now             EDUCATION NEEDS:   Not appropriate for education at this time  Skin:  Skin Assessment: Reviewed RN Assessment(distal portion of sternal wound 2.2cm x 0.4cm x 0.2cm)  Last BM:  5/13  Height:   Ht Readings from Last 1 Encounters:  12/09/2019 5' 7"  (1.702 m)    Weight:   Wt Readings from Last 1 Encounters:  11/27/19 85.8 kg    Ideal Body Weight:  67.3 kg  BMI:  Body mass index is 29.62 kg/m.  Estimated Nutritional Needs:   Kcal:  2000-2300kcal/day  Protein:  100-115g/day  Fluid:  2L/day  Koleen Distance MS, RD, LDN Please refer to  [facility] for RD and/or RD on-call/weekend/after hours pager

## 2019-11-27 NOTE — Care Management Important Message (Signed)
Important Message  Patient Details  Name: Larry Terrell MRN: [id number] Date of Birth: [birth year]   Medicare Important Message Given:  Yes     Dannette Barbara 11/27/2019, 12:33 PM

## 2019-11-27 NOTE — Progress Notes (Signed)
PROGRESS NOTE    Larry Terrell  ZOX:[id number] DOB: 12-26-1960 DOA: 11/22/2019 PCP: Medicine, [employer]      Brief Narrative:  Larry Terrell is a 59 y.o. M with hx CAD s/p PCI x2 2015 and CABG x5 last month at [facility], postop A. fib on amiodarone and Eliquis, COPD on 2 L home O2, depression, chronic pain, DM, HTN, CHF EF 45%, obesity, and PVD status post aortofemoral bypass, CKD 3B baseline creatinine 1.7, and NASH who presents with fall.  Caveat the patient is now confused, poorly able to provide history.  Per report, patient was at SNF today when he fell, hit his head, was brought to the ER.  In the ER, the patient was initially hypoxic requiring 3-4L oxygen (baseline at 2.  CT head unremarkable.  Chest x-ray showed edema.  Electrolytes and blood count and renal function stable relative to baseline, hemodynamically stable.  While in the ER, the patient became progressively more encephalopathic and hypoxic to 70% on 4 L,, and confused.  Was placed on BiPAP, Lasix was given, and the hospital service were asked to evaluate for respiratory failure and confusion.            Assessment & Plan:  Acute on chronic hypoxic respiratory failure due to chronic systolic and diastolic CHF Net negative 4L yesterday, Cr stable, K slightly down.   -Continue Lasix, reduced back to BID -Continue K -Strict I/Os, daily weights, telemetry  -Daily monitoring renal function -Consult cardiology, appreciate cares    Acute left posterior stroke, likely embolic -Non-invasive angiography showed diffuse atherosclerosis -Echocardiogram showed no cardiogenic source of embolism -Continue atorvastatin -Continue aspirin, Eliquis -Atrial fibrillation: Known -tPA not given because symptoms resolved  Acute metabolic encephalopathy superimposed on baseline cognitive impairment This appears to be notably worse since his stroke. 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  He will need ongoing IV Lasix.   Dispo: The patient is from: SNF              Anticipated d/c is to: SNF              Anticipated d/c date is: 2 days              Patient currently is not medically stable to d/c.                MDM: The below labs and imaging reports were reviewed and summarized above.  Medication management as above.  This is a severe exacerbation of his chronic disease  DVT prophylaxis: N/A on systemic anticoagulation Code Status: FULL Family Communication: Sister by phone    Consultants:     Procedures:     Antimicrobials:   CTX and azithromycin x1 on 5/13   Culture data:   5/13 blood culture x2 -- ngtd           Subjective: He is interactive however still somewhat disoriented  and confused.  He requires ongoing oxygen but weaned 3 L/min.  Still significantly debilitated.  No fever, no respiratory distress, no vomiting or diarrhea.     Objective: Vitals:   11/27/19 0950 11/27/19 0956 11/27/19 1153 11/27/19 1609  BP:   120/76 (!) 154/79  Pulse:   89 83  Resp:    19  Temp:   97.7 F (36.5 C) 98.3 F (36.8 C)  TempSrc:   Oral   SpO2: (!) 85% 93% 94% 92%  Weight:      Height:         Intake/Output Summary (Last 24 hours) at 11/27/2019 1833 Last data filed at 11/27/2019 1638 Gross per 24 hour  Intake 240 ml  Output 4100 ml  Net -3860 ml   Filed Weights   11/25/19 0416 11/26/19 0500 11/27/19 6734  Weight: 85.7 kg 85.3 kg 85.8 kg    Examination: General appearance: Chronically ill-appearing adult male.  Awake, appears to be watching TV, but debilitated and somewhat disoriented.     HEENT: Clear, conjunctival pink, lids and lashes normal.  Slight ptosis on the right.  He has some dried epistaxis, no nasal deformity or discharge.  He has edentulous, lips are normal, oropharynx moist, no oral lesions, hearing seems diminished. Skin: No suspicious rashes or lesions.  Pale. Cardiac: Irregularly irregular, no murmurs, JVP not visible, no lower extremity edema Respiratory: Respiratory rate is increased, extensive rales bilaterally.  No wheezing. Abdomen: Abdomen soft, no tenderness palpation or guarding.  No hepatosplenomegaly or distention. MSK: Severely decreased loss of subcutaneous muscle mass and fat. Neuro: Psychomotor slowing is obvious.  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  Extraocular movements appear intact.  He has some mild left upper extremity weakness, speech is dysarthric but at baseline Psych: Attention diminished, affect blunted, judgment and insight appear moderately impaired         Data Reviewed: I have personally reviewed following labs and imaging studies:  CBC: Recent Labs  Lab 11/26/2019 1208 11/24/19 0015 11/25/19 0520 11/26/19 0557 11/27/19 0515  WBC 8.8 7.9 8.2 9.6 8.1  NEUTROABS 7.3  --   --   --   --   HGB 9.9* 9.6* 10.1* 9.6* 9.7*  HCT 32.0* 31.1* 31.4* 29.6* 30.8*  MCV 97.0 97.8 94.0 92.8 94.2  PLT 215 205 193 173 193   Basic Metabolic Panel: Recent Labs  Lab 12/05/2019 1208 11/24/19 0015 11/25/19 0520 11/26/19 0557 11/27/19 0515  NA 139 140 141 141 140  K 4.5 3.7 3.9 3.6 3.4*  CL 103 101 102  99 95*  CO2 25 29 31  32 33*  GLUCOSE 180* 259* 212* 162* 145*  BUN 34* 34* 27* 29* 29*  CREATININE 1.72* 1.60* 1.29* 1.23 1.26*  CALCIUM 8.9 8.6* 8.7* 8.4* 8.7*   GFR: Estimated Creatinine Clearance: 64.4 mL/min (A) (by C-G formula based on SCr of 1.26 mg/dL (H)). Liver Function Tests: Recent Labs  Lab 11/20/2019 1208 11/24/19 0015  AST 19 16  ALT 13 12  ALKPHOS 100 97  BILITOT 1.0 0.7  PROT 6.9 6.7  ALBUMIN 3.1* 2.8*   No results for input(s): LIPASE, AMYLASE in the last 168 hours. Recent Labs  Lab 12/10/2019 1951  AMMONIA <9*   Coagulation Profile: Recent Labs  Lab 12/04/2019 1208  INR 1.9*   Cardiac Enzymes: No results for input(s): CKTOTAL, CKMB, CKMBINDEX, TROPONINI in the last 168 hours. BNP (last 3 results) No results for input(s): PROBNP in the last 8760 hours. HbA1C: Recent  Labs    11/25/19 0520  HGBA1C 5.2   CBG: Recent Labs  Lab 11/26/19 1705 11/26/19 2121 11/27/19 0728 11/27/19 1149 11/27/19 1656  GLUCAP 157* 187* 165* 244* 268*   Lipid Profile: Recent Labs    11/25/19 0520  CHOL 49  HDL 22*  LDLCALC 13  TRIG 69  CHOLHDL 2.2   Thyroid Function Tests: No results for input(s): TSH, T4TOTAL, FREET4, T3FREE, THYROIDAB in the last 72 hours. Anemia Panel: No results for input(s): VITAMINB12, FOLATE, FERRITIN, TIBC, IRON, RETICCTPCT in the last 72 hours. Urine analysis:    Component Value Date/Time   COLORURINE YELLOW 10/25/2019 0424   APPEARANCEUR CLEAR 10/25/2019 0424   APPEARANCEUR Clear 11/06/2014 1527   LABSPEC 1.009 10/25/2019 0424   LABSPEC 1.008 11/06/2014 1527   PHURINE 6.0 10/25/2019 0424   GLUCOSEU NEGATIVE 10/25/2019 0424   GLUCOSEU Negative 11/06/2014 1527   HGBUR NEGATIVE 10/25/2019 0424   BILIRUBINUR NEGATIVE 10/25/2019 0424   BILIRUBINUR Negative 11/06/2014 1527   KETONESUR NEGATIVE 10/25/2019 0424   PROTEINUR 100 (A) 10/25/2019 0424   NITRITE NEGATIVE 10/25/2019 0424   LEUKOCYTESUR NEGATIVE 10/25/2019 0424    LEUKOCYTESUR Negative 11/06/2014 1527   Sepsis Labs: @LABRCNTIP (procalcitonin:4,lacticacidven:4)  ) Recent Results (from the past 240 hour(s))  SARS Coronavirus 2 by RT PCR (hospital order, performed in [place] hospital lab) Nasopharyngeal Nasopharyngeal Swab     Status: None   Collection Time: 12/02/2019  2:05 PM   Specimen: Nasopharyngeal Swab  Result Value Ref Range Status   SARS Coronavirus 2 NEGATIVE NEGATIVE Final    Comment: (NOTE) SARS-CoV-2 target nucleic acids are NOT DETECTED. The SARS-CoV-2 RNA is generally detectable in upper and lower respiratory specimens during the acute phase of infection. The lowest concentration of SARS-CoV-2 viral copies this assay can detect is 250 copies / mL. A negative result does not preclude SARS-CoV-2 infection and should not be used as the sole basis for treatment or other patient management decisions.  A negative result may occur with improper specimen collection / handling, submission of specimen other than nasopharyngeal swab, presence of viral mutation(s) within the areas targeted by this assay, and inadequate number of viral copies (<250 copies / mL). A negative result must be combined with clinical observations, patient history, and epidemiological information. Fact Sheet for Patients:   StrictlyIdeas.no Fact Sheet for Healthcare Providers: BankingDealers.co.za This test is not yet approved or cleared  by the [place] FDA and has been authorized for detection and/or diagnosis of SARS-CoV-2 by FDA under an Emergency Use Authorization (EUA).  This EUA will remain in effect (meaning this test can be used) for the duration of the COVID-19 declaration under Section 564(b)(1) of the Act, 21 U.S.C. section 360bbb-3(b)(1), unless the authorization is terminated or revoked sooner. Performed at [facility], [address] [place], [place] [address]   Culture, blood (x 2)      Status: None (Preliminary result)   Collection Time: 12/04/2019  8:54 PM   Specimen: BLOOD  Result Value Ref Range Status   Specimen Description BLOOD RAC  Final   Special Requests BOTTLES DRAWN AEROBIC AND ANAEROBIC BCAV  Final   Culture   Final    NO GROWTH 4 DAYS Performed at [facility], [address], [place], [place] [address]    Report Status PENDING  Incomplete  Culture, blood (x 2)     Status: None (Preliminary result)   Collection Time: 11/28/2019  8:54 PM   Specimen: BLOOD  Result Value Ref  Range Status   Specimen Description BLOOD BRH  Final   Special Requests BOTTLES DRAWN AEROBIC AND ANAEROBIC BCAV  Final   Culture   Final    NO GROWTH 4 DAYS Performed at [facility], [address] [place], [place] [address]    Report Status PENDING  Incomplete         Radiology Studies: No results found.      Scheduled Meds: .  stroke: mapping our early stages of recovery book   Does not apply Once  . amiodarone  200 mg Oral Daily  . apixaban  5 mg Oral BID  . aspirin  81 mg Oral Daily  . atorvastatin  80 mg Oral QPM  . ezetimibe  10 mg Oral Daily  . famotidine  20 mg Oral BID  . furosemide  80 mg Intravenous BID  . insulin aspart  0-15 Units Subcutaneous TID WC  . insulin aspart  0-5 Units Subcutaneous QHS  . insulin glargine  5 Units Subcutaneous Daily  . levETIRAcetam  500 mg Oral TID  . mouth rinse  15 mL Mouth Rinse BID  . metoprolol tartrate  37.5 mg Oral BID  . pantoprazole  40 mg Oral Daily  . potassium chloride  40 mEq Oral Daily  . pregabalin  50 mg Oral TID  . Ensure Max Protein  11 oz Oral BID  . venlafaxine  75 mg Oral TID   Continuous Infusions:    LOS: 4 days    Time spent: 25 minutes    Edwin Dada, MD [facility] 11/27/2019, 6:33 PM     Please page though [employer] or Epic secure chat:  For [employer], Adult nurse

## 2019-11-27 NOTE — Progress Notes (Addendum)
Progress Note  Patient Name: Larry Terrell Date of Encounter: 11/27/2019  Primary Cardiologist: Ida Rogue, MD   Subjective   No CP, palpitations, or racing HR. Reports breathing OK - currently on 3L [place] oxygen. Denies any swelling of lower extremities or abdomen. Currently eating lunch and tolerating this well.   Inpatient Medications    Scheduled Meds: .  stroke: mapping our early stages of recovery book   Does not apply Once  . amiodarone  200 mg Oral Daily  . apixaban  5 mg Oral BID  . aspirin  81 mg Oral Daily  . atorvastatin  80 mg Oral QPM  . ezetimibe  10 mg Oral Daily  . famotidine  20 mg Oral BID  . furosemide  80 mg Intravenous BID  . insulin aspart  0-15 Units Subcutaneous TID WC  . insulin aspart  0-5 Units Subcutaneous QHS  . insulin glargine  5 Units Subcutaneous Daily  . levETIRAcetam  500 mg Oral TID  . mouth rinse  15 mL Mouth Rinse BID  . metoprolol tartrate  37.5 mg Oral BID  . pantoprazole  40 mg Oral Daily  . potassium chloride  40 mEq Oral Daily  . pregabalin  50 mg Oral TID  . venlafaxine  75 mg Oral TID   Continuous Infusions:  PRN Meds: acetaminophen **OR** acetaminophen, ondansetron **OR** ondansetron (ZOFRAN) IV   Vital Signs    Vitals:   11/27/19 0731 11/27/19 0827 11/27/19 0950 11/27/19 0956  BP: (!) 175/77     Pulse: 75     Resp: 19     Temp: 98.2 F (36.8 C)     TempSrc:      SpO2: 98% 90% (!) 85% 93%  Weight:      Height:        Intake/Output Summary (Last 24 hours) at 11/27/2019 1152 Last data filed at 11/27/2019 0624 Gross per 24 hour  Intake --  Output 2600 ml  Net -2600 ml   Last 3 Weights 11/27/2019 11/26/2019 11/25/2019  Weight (lbs) 189 lb 1.6 oz 188 lb 1.2 oz 189 lb  Weight (kg) 85.775 kg 85.31 kg 85.73 kg      Telemetry    Afib with ventricular rates 70-90s - Personally Reviewed  ECG    No new tracings - Personally Reviewed  Physical Exam   GEN: No acute distress.  Some confusion at times Neck: No  JVD Cardiac: IRIR, no murmurs, rubs, or gallops.  Respiratory: Bibasilar reduced breath sounds and worse on L than R. GI: Soft, nontender, non-distended  MS: No edema; No deformity. Neuro:  Nonfocal  Psych: Normal affect   Labs    High Sensitivity Troponin:   Recent Labs  Lab 11/17/2019 1208 11/30/2019 1405  TROPONINIHS 10 10      Chemistry Recent Labs  Lab 12/10/2019 1208 11/22/2019 1208 11/24/19 0015 11/24/19 0015 11/25/19 0520 11/26/19 0557 11/27/19 0515  NA 139   < > 140   < > 141 141 140  K 4.5   < > 3.7   < > 3.9 3.6 3.4*  CL 103   < > 101   < > 102 99 95*  CO2 25   < > 29   < > 31 32 33*  GLUCOSE 180*   < > 259*   < > 212* 162* 145*  BUN 34*   < > 34*   < > 27* 29* 29*  CREATININE 1.72*   < > 1.60*   < >  1.29* 1.23 1.26*  CALCIUM 8.9   < > 8.6*   < > 8.7* 8.4* 8.7*  PROT 6.9  --  6.7  --   --   --   --   ALBUMIN 3.1*  --  2.8*  --   --   --   --   AST 19  --  16  --   --   --   --   ALT 13  --  12  --   --   --   --   ALKPHOS 100  --  97  --   --   --   --   BILITOT 1.0  --  0.7  --   --   --   --   GFRNONAA 42*   < > 46*   < > 59* >60 >60  GFRAA 49*   < > 53*   < > >60 >60 >60  ANIONGAP 11   < > 10   < > 8 10 12    < > = values in this interval not displayed.     Hematology Recent Labs  Lab 11/25/19 0520 11/26/19 0557 11/27/19 0515  WBC 8.2 9.6 8.1  RBC 3.34* 3.19* 3.27*  HGB 10.1* 9.6* 9.7*  HCT 31.4* 29.6* 30.8*  MCV 94.0 92.8 94.2  MCH 30.2 30.1 29.7  MCHC 32.2 32.4 31.5  RDW 19.3* 19.1* 18.8*  PLT 193 173 197    BNP Recent Labs  Lab 12/06/2019 1208  BNP 273.0*     DDimer No results for input(s): DDIMER in the last 168 hours.   Radiology    DG Chest 2 View  Result Date: 11/25/2019 CLINICAL DATA:  Pleural effusion EXAM: CHEST - 2 VIEW COMPARISON:  12/04/2019 FINDINGS: Post CABG changes. Stable cardiomegaly. Progressive interstitial and alveolar opacities throughout both lungs. Small to moderate bilateral pleural effusions. No pneumothorax.  IMPRESSION: Progressive bilateral interstitial and alveolar opacities, which may reflect pulmonary edema versus multifocal pneumonia. Small to moderate bilateral pleural effusions. Electronically Signed   By: Davina Poke D.O.   On: 11/25/2019 17:02    Cardiac Studies   Echo 11/27/2019 1. Left ventricular ejection fraction, by estimation, is 55 to 60%. The  left ventricle has normal function. The left ventricle has no regional  wall motion abnormalities. There is mild left ventricular hypertrophy.  Left ventricular diastolic parameters  are indeterminate.  2. Right ventricular systolic function is normal. The right ventricular  size is normal. There is normal pulmonary artery systolic pressure.  3. Left atrial size was moderately dilated.  4. Mild mitral valve regurgitation.  5. Images suggestive of left pleural effusion   RAP 37mHg, RVSP 33.882mg Small pericardial effusion  Echo 10/20/19 1. Left ventricular ejection fraction, by estimation, is 55%. The left  ventricle has low normal function. Grossly, no significant wall motion  abnormality. There is mild left ventricular hypertrophy of the septal  segment. Grade II diastolic dysfunction.  2. Right ventricular systolic function is normal. The right ventricular  size is normal. Tricuspid regurgitation signal is inadequate for assessing  PA pressure.  3. Left atrial size was moderately dilated.  Cardiac cath 10/2019 [facility]02/2020: Coronary dominance: Right  Left mainstem: Large vessel that bifurcates into the LAD and left circumflex, no significant disease noted, unable to exclude mild distal left main disease, appears hazy  Left anterior descending (LAD): Large vessel that extends to the apical region, diagonal branch 2 of moderate size, severe proximal LAD disease at  the takeoff of the diagonal estimated 95%, mild distal LAD disease  Left circumflex (LCx): Large vessel with OM branch 2, proximal to mid left  circumflex is occluded with in-stent restenosis OM seen distally, fills via collaterals High ramus with severe ostial disease estimated 90%  Right coronary artery (RCA): Right dominant vessel with PL and PDA, severe in-stent restenosis of mid vessel stent, severe ostial PDA disease  Left ventriculography: Left ventricular systolic function is low normal, LVEF is estimated at 50%, there is no significant mitral regurgitation , no significant aortic valve stenosis Mild inferior wall hypokinesis  Final Conclusions:  Severe three-vessel coronary disease including Severe proximal LAD Severe mid RCA, in-stent restenosis , distal RCA Occluded proximal to mid left circumflex in-stent occlusion Severe ostial ramus disease Mild inferior wall hypokinesis  Recommendations:  Case discussed with interventional cardiology Given he has not been responding well to stenting as noted by occluded left circumflex stent, in-stent restenosis on the right in the setting of diabetes, would likely be a good candidate for CABG Given the severity of his symptoms and disease, will transfer to [facility] for further discussion and evaluation     Patient Profile     59 y.o. male with hx of CAD s/p remote PCI and CABG x5 10/2019, post op Afib on amiodarone and Eliquis, HFpEF (EF 55-60%), TIA, PAD s/p arterial bifemoral bypass 2013 with known stenosis of bilateral SFA, DM2, hypertension, hyperlipidemia, COPD on 3L [place] at home, and prior tobacco use who is being seen today for the evaluation of acute on chronic HFpEF.  Assessment & Plan    Respiratory distress Hypoxia --Improved breathing status. Presented s/p fall with AMS and hypoxia / elevated volume status on presentation. Known h/o Afib. Acute stroke found on MRI.  S/p IV diuresis and BiPAP with improvement in his breathing / volume status and renal function.   Acute on chronic HFpEF --Denies SOB and now back to baseline 3L [place] oxygen. He has undergone IV  diuresis with volume status significantly improved from admission. --Echo showed nl EF with RAP 28mHg, RVSP 33.822mg, and small pericardial effusion. --Continue IV diuresis for now with further consideration of transition to oral diuresis tomorrow. Still significant volume output with diuresis as below. Will need transition to an oral diuretic prior to discharge given his Afib and with KCl supplementation as below.  --Daily BMET Cr 1.72  1.23  1.26 and BUN 29.  --Continue to monitor I/Os, daily standing weights. Net -4.2L yesterday and -7.9 L for the admission. -2L for today. Wt decrease at 89.8kg  85.8kg.  --Continue medication management with IV diuresis, BB, and ACE. Will need discharge with oral potassium in addition to oral diuretic as below.  Recent Fall  Confusion  CVA -- Not a great historian with details surrounding this fall. CTA negative for stroke with subsequent MRI showing acute stroke. Also considered fall 2/2 arrhythmia and volume overload with hypoxia at presentation. Telemetry so far only demonstrates known Afib and frequent PVCs. Repeat echo as above with nl EF and no RWMA.  If no additional arrhythmia caught on EKG or telemetry, consider Zio at discharge. Followed by neurology. --Recommend PT/OT before discharge given he does appear deconditioned / weak on exam and has not yet ambulated. Agree with SNF placement given weakness and confusion.   CAD s/p CABG --No CP. EKG without acute ST/T changes. HS Tn not consistent with ACS. No indication for further ischemic workup.  --Continue GDMT with BB, ACE, and statin.  Continue statin  for risk factor modification. Midsternal incision showed some serious drainage in outpatient clinic and followed by wound care due to concern for infection. Will need OP follow-up in the office.   HTN --BP controlled. Continue current medications.   Persistent Atrial fibrillation --Known Afib and first noted s/p CABG 10/2019. --Continue BB for  rate control.  --Amiodarone recently reduced to 240m daily. Monitor lung, liver, and thyroid function.  --Continue Eliquis 572mBID for CHA2DS2VASc score of at least 3. No s/sx of bleeding. Daily CBC. Due to confusion and recent stroke, not an ideal candidate for cardioversion at this time.   HLD --Continue statin and Zetia. Due for repeat lipid and liver function in 6-8 weeks.   PAD --Continue ASA, statin, and Zetia.  Anemia --Hgb low at 9.7 with baseline Hgb also low and HCT stable. Consider anemia of chronic dz. Per IM.  CKD --Daily BMET. Renal function improved with diuresis.  Hypokalemia --Replete with goal 4.0 on IV lasix. Will likely need oral potassium supplementation at discharge with oral diuretic.   COPD --Previous long smoking history on 3L [place]. Consider that this contributes to breathing issues.    For questions or updates, please contact [facility] consult [email or web address] for contact info under        Signed, JaArvil ChacoPA-C  11/27/2019, 11:52 AM

## 2019-11-27 NOTE — Progress Notes (Signed)
Occupational Therapy Treatment Patient Details Name: Larry Terrell MRN: [id number] DOB: 22-Dec-1960 Today's Date: 11/27/2019    History of present illness Pt is a 59 y.o. M with hx CAD s/p PCI x2 2015 and CABG x5 last month at [facility], postop A. fib on amiodarone and Eliquis, COPD on 2 L home O2, depression, chronic pain, DM, HTN, CHF EF 45%, obesity, and PVD status post aortofemoral bypass, CKD 3B baseline creatinine 1.7, and NASH who presents with fall, and confusion placed on bipap in ED. MRI + for Acute left PCA territory infarct, Punctate focus of restricted diffusion in the left corona radiata, consistent with acute infarct. 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   OT comments  Pt seen for OT tx this date. Pt asleep upon arrival and with min-mod verbal and tactile cues pt able to improve alertness for participation. Pt denies pain or discomfort. Oriented to self, able to follow simple commands with max multimodal cues, ultimately unable to safely perform sup>sit EOB. Min A for log rolling in bed. Mod A for UB dressing task with more cuing required for R side of task. Pt demonstrating preference for L sided cervical rotation but able to rotate to the right when prompted. RT in for treatment. Pt continues to benefit from skilled OT services. Continue to recommend SNF.   Follow Up Recommendations  SNF;Supervision/Assistance - 24 hour    Equipment Recommendations       Recommendations for Other Services      Precautions / Restrictions Precautions Precautions: Sternal;Fall Precaution Booklet Issued: No Restrictions Weight Bearing Restrictions: No Other Position/Activity Restrictions: sternal precautions       Mobility Bed Mobility Overal bed mobility: Needs Assistance Bed Mobility: Rolling Rolling: Min assist         General bed mobility comments: With attempts, pt unable to safely follow commands to perform supine>sit EOB with max cues; able to log roll with  Min A  Transfers                      Balance                                           ADL either performed or assessed with clinical judgement   ADL Overall ADL's : Needs assistance/impaired                 Upper Body Dressing : Maximal assistance Upper Body Dressing Details (indicate cue type and reason): cues for sequencing with additional verbal/visual cues to attend to R side to thread RUE through sleeve of gown                         Vision       Perception     Praxis      Cognition Arousal/Alertness: Awake/alert Behavior During Therapy: Flat affect Overall Cognitive Status: History of cognitive impairments - at baseline                                 General Comments: Pt sleeping upon arrival, alertness improving with verbal cues, follows commands with mod-max verbal/visual/tactile cues, particularly to R side        Exercises     Shoulder Instructions       General Comments  Pertinent Vitals/ Pain       Pain Assessment: No/denies pain  Home Living                                          Prior Functioning/Environment              Frequency  Min 2X/week        Progress Toward Goals  OT Goals(current goals can now be found in the care plan section)  Progress towards OT goals: OT to reassess next treatment  Acute Rehab OT Goals Patient Stated Goal: none stated OT Goal Formulation: With patient Time For Goal Achievement: 12/09/19 Potential to Achieve Goals: [place] Discharge plan remains appropriate;Frequency remains appropriate    Co-evaluation                 AM-PAC OT "6 Clicks" Daily Activity     Outcome Measure   Help from another person eating meals?: A Little Help from another person taking care of personal grooming?: A Lot Help from another person toileting, which includes using toliet, bedpan, or urinal?: A Lot Help from another  person bathing (including washing, rinsing, drying)?: A Lot Help from another person to put on and taking off regular upper body clothing?: A Lot Help from another person to put on and taking off regular lower body clothing?: A Lot 6 Click Score: 13    End of Session Equipment Utilized During Treatment: Oxygen  OT Visit Diagnosis: Unsteadiness on feet (R26.81);Other abnormalities of gait and mobility (R26.89);Muscle weakness (generalized) (M62.81)   Activity Tolerance Patient tolerated treatment well   Patient Left in bed;with call bell/phone within reach;with bed alarm set   Nurse Communication          Time: 1610-9604 OT Time Calculation (min): 18 min  Charges: OT General Charges $OT Visit: 1 Visit OT Treatments $Self Care/Home Management : 8-22 mins  Jeni Salles, MPH, MS, OTR/L ascom [phone] 11/27/19, 3:33 PM

## 2019-11-27 NOTE — Progress Notes (Signed)
cpap refused

## 2019-11-28 DIAGNOSIS — I4819 Other persistent atrial fibrillation: Secondary | ICD-10-CM

## 2019-11-28 LAB — BASIC METABOLIC PANEL WITH GFR
Anion gap: 9 (ref 5–15)
BUN: 33 mg/dL — ABNORMAL HIGH (ref 8–23)
CO2: 37 mmol/L — ABNORMAL HIGH (ref 22–32)
Calcium: 8.9 mg/dL (ref 8.9–10.3)
Chloride: 95 mmol/L — ABNORMAL LOW (ref 98–111)
Creatinine, Ser: 1.34 mg/dL — ABNORMAL HIGH (ref 0.61–1.24)
GFR calc Af Amer: >60 mL/min (ref >60)
GFR calc non Af Amer: 57 mL/min — ABNORMAL LOW (ref >60)
Glucose, Bld: 176 mg/dL — ABNORMAL HIGH (ref 70–99)
Potassium: 3.6 mmol/L (ref 3.5–5.1)
Sodium: 141 mmol/L (ref 135–145)

## 2019-11-28 LAB — CULTURE, BLOOD (ROUTINE X 2)
Culture: NO GROWTH
Culture: NO GROWTH

## 2019-11-28 LAB — CBC
HCT: 31.5 % — ABNORMAL LOW (ref 39.0–52.0)
Hemoglobin: 10 g/dL — ABNORMAL LOW (ref 13.0–17.0)
MCH: 29.4 pg (ref 26.0–34.0)
MCHC: 31.7 g/dL (ref 30.0–36.0)
MCV: 92.6 fL (ref 80.0–100.0)
Platelets: 209 K/uL (ref 150–400)
RBC: 3.4 MIL/uL — ABNORMAL LOW (ref 4.22–5.81)
RDW: 18.9 % — ABNORMAL HIGH (ref 11.5–15.5)
WBC: 7.8 K/uL (ref 4.0–10.5)
nRBC: 0 % (ref 0.0–0.2)

## 2019-11-28 LAB — GLUCOSE, CAPILLARY
Glucose-Capillary: 149 mg/dL — ABNORMAL HIGH (ref 70–99)
Glucose-Capillary: 221 mg/dL — ABNORMAL HIGH (ref 70–99)
Glucose-Capillary: 183 mg/dL — ABNORMAL HIGH (ref 70–99)
Glucose-Capillary: 175 mg/dL — ABNORMAL HIGH (ref 70–99)

## 2019-11-28 LAB — SARS CORONAVIRUS 2 (TAT 6-24 HRS): SARS Coronavirus 2: NEGATIVE

## 2019-11-28 MED ORDER — SODIUM CHLORIDE 0.9 % IV SOLN: INTRAVENOUS | Status: DC

## 2019-11-28 MED ORDER — FUROSEMIDE 40 MG PO TABS
40.0000 mg | ORAL_TABLET | Freq: Two times a day (BID) | ORAL | Status: DC
  Administered 2019-11-29 (×2): 40 mg via ORAL
  Filled 2019-11-28 (×2): qty 1

## 2019-11-28 NOTE — Progress Notes (Addendum)
PROGRESS NOTE    Larry Terrell  EFE:[id number] DOB: January 21, 1961 DOA: 11/13/2019 PCP: Medicine, [employer]      Brief Narrative:  Larry Terrell is a 59 y.o. M with hx CAD s/p PCI x2 2015 and CABG x5 last month at [facility], postop A. fib on amiodarone and Eliquis, COPD on 2 L home O2, depression, chronic pain, DM, HTN, CHF EF 45%, obesity, and PVD status post aortofemoral bypass, CKD 3B baseline creatinine 1.7, and NASH who presents with fall.  Caveat the patient is now confused, poorly able to provide history.  Per report, patient was at SNF today when he fell, hit his head, was brought to the ER.  In the ER, the patient was initially hypoxic requiring 3-4L oxygen (baseline at 2.  CT head unremarkable.  Chest x-ray showed edema.  Electrolytes and blood count and renal function stable relative to baseline, hemodynamically stable.  While in the ER, the patient became progressively more encephalopathic and hypoxic to 70% on 4 L,, and confused.  Was placed on BiPAP, Lasix was given, and the hospital service were asked to evaluate for respiratory failure and confusion.            Assessment & Plan:  Acute on chronic hypoxic respiratory failure due to chronic systolic and diastolic CHF Presented with fall, found to have increased O2 needs, edema on CXR. BNP 200s.  Echo during hospitalization for CABG last month showed EF 45%. Not on ARB due to renal dysfunction.    Repeat CXR after 2 days IV Lasix actually showed worsening infiltrates, still without fever, procalcitonin elevation or leukocytosis.  Lasix increased and O2 needs weaned down to 3L.  Per records from last hospital stay and interim office notes, he was discharged after CABG on 3L.    Net negative 10.4L so far, 2.4 liters yesterday.  Cr slightly up but overall stable.   -Continue oral Lasix, plan for d/c on 40 PO BID -Continue K -Strict I/Os, daily weights, telemetry  -Daily monitoring renal function -Consult cardiology,  appreciate cares    Acute left posterior stroke, likely embolic On admission, patient noted to be confused, no focal deficits noted, but concern for vision changes. Not tPA candidate given on Eliquis.   MRI obtained due to sudden mental status change shows new left PCA infarct involving the splenium of the corpus callosum, posterior aspect of the left fornix, left occipital lobe, left medial temporal lobe, and body of the left hippocampus. -Non-invasive angiography showed diffuse atherosclerosis -Echocardiogram showed no cardiogenic source of embolism -Continue atorvastatin -Continue aspirin, Eliquis -Atrial fibrillation: Known -tPA not given because symptoms resolved  Acute metabolic encephalopathy superimposed on baseline cognitive impairment This appears to be notably worse since his stroke. 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 Glucose controlled -Continue Lantus, -Continue correction insulin  COPD No  wheezing to suggest flare  Depression Chronic pain -Continue venlafaxine, Lyrica, oxycodone  Atrial fibrillation, paroxysmal, post CABG Heart rate stable -Continue amiodarone and Eliquis  History of seizures No seizures -Continue Keppra    Disposition: Status is: Inpatient  Remains inpatient appropriate because: safe disposition plan is pending insurance authorization   Dispo: The patient is from: SNF              Anticipated d/c is to: SNF  Anticipated d/c date is: 1 day              Patient currently is medically stable to d/c.      Patient admitted and diuresed 10 L.  Found to have stroke.  Neurology consulted, all stroke risk factor modification  done.  Diuresed back to dry weight and home O2, will need SNF certainly, due to significant debility  Cardiology plan TEE tomorrow, likely to SNF afterwards          MDM: The below labs and imaging reports reviewed and summarized above.  Medication management as above.    DVT prophylaxis: N/A on systemic anticoagulation Code Status: FULL Family Communication: Sister by phone    Consultants:     Procedures:     Antimicrobials:   CTX and azithromycin x1 on 5/13   Culture data:   5/13 blood culture x2 -- ngtd           Subjective: Patient is much more alert today, interactive.  He has been weaned down to his 3 L, he appears debilitated, but denies chest pain, dyspnea, confusion, fever.    Objective: Vitals:   11/28/19 0404 11/28/19 0752 11/28/19 1132 11/28/19 1507  BP: 130/85 (!) 147/80 139/81 137/86  Pulse: 85 79 77 76  Resp: 14  17 17   Temp: 97.6 F (36.4 C) 98 F (36.7 C) 97.7 F (36.5 C) 97.6 F (36.4 C)  TempSrc: Oral     SpO2: 97% 93% 94% 96%  Weight: 83.6 kg     Height:        Intake/Output Summary (Last 24 hours) at 11/28/2019 1643 Last data filed at 11/28/2019 1413 Gross per 24 hour  Intake 480 ml  Output 1750 ml  Net -1270 ml   Filed Weights   11/26/19 0500 11/27/19 0638 11/28/19 0404  Weight: 85.3 kg 85.8 kg 83.6 kg    Examination: General appearance: Chronically ill-appearing adult male.  Awake, eating breakfast, no acute distress.     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  Extraocular movements appear intact, no nystagmus.  Left upper extremity weakness, speech dysarthric but at baseline. Psych: Psychomotor slowing is noted, but much more alert than  yesterday.  Attention normal, affect pleasant, judgment insight appear moderately impaired.        Data Reviewed: I have personally reviewed following labs and imaging studies:  CBC: Recent Labs  Lab 12/07/2019 1208 11/11/2019 1208 11/24/19 0015 11/25/19 0520 11/26/19 0557 11/27/19 0515 11/28/19 0609  WBC 8.8   < > 7.9 8.2 9.6 8.1 7.8  NEUTROABS 7.3  --   --   --   --   --   --   HGB 9.9*   < > 9.6* 10.1* 9.6* 9.7* 10.0*  HCT 32.0*   < > 31.1* 31.4* 29.6* 30.8* 31.5*  MCV 97.0   < > 97.8 94.0 92.8 94.2 92.6  PLT 215   < > 205 193 173 197 209   < > = values in this interval not displayed.   Basic Metabolic Panel: Recent Labs  Lab 11/24/19 0015 11/25/19 0520 11/26/19 0557 11/27/19 0515 11/28/19 0609  NA 140 141 141 140 141  K 3.7 3.9 3.6 3.4* 3.6  CL 101 102 99 95* 95*  CO2 29 31 32 33* 37*  GLUCOSE 259* 212* 162* 145* 176*  BUN 34* 27* 29* 29* 33*  CREATININE 1.60* 1.29* 1.23 1.26* 1.34*  CALCIUM 8.6* 8.7* 8.4* 8.7* 8.9   GFR: Estimated Creatinine Clearance: 59.9 mL/min (A) (by C-G formula based on SCr of 1.34 mg/dL (H)). Liver Function Tests: Recent Labs  Lab 11/14/2019 1208 11/24/19 0015  AST 19 16  ALT 13 12  ALKPHOS 100 97  BILITOT 1.0 0.7  PROT 6.9 6.7  ALBUMIN 3.1* 2.8*   No results for input(s): LIPASE, AMYLASE in the last 168 hours. Recent Labs  Lab 11/18/2019 1951  AMMONIA <9*   Coagulation Profile: Recent Labs  Lab 11/24/2019 1208  INR 1.9*   Cardiac Enzymes: No results for input(s): CKTOTAL, CKMB, CKMBINDEX, TROPONINI in the last 168 hours. BNP (last 3 results) No results for input(s): PROBNP in the last 8760 hours. HbA1C: No results for input(s): HGBA1C in the last 72 hours. CBG: Recent Labs  Lab 11/27/19 1656 11/27/19 2118 11/28/19 0744 11/28/19 1132 11/28/19 1633  GLUCAP 268* 187* 149* 221* 183*   Lipid Profile: No results for input(s): CHOL, HDL, LDLCALC, TRIG, CHOLHDL, LDLDIRECT in the last 72 hours. Thyroid Function  Tests: No results for input(s): TSH, T4TOTAL, FREET4, T3FREE, THYROIDAB in the last 72 hours. Anemia Panel: No results for input(s): VITAMINB12, FOLATE, FERRITIN, TIBC, IRON, RETICCTPCT in the last 72 hours. Urine analysis:    Component Value Date/Time   COLORURINE YELLOW 10/25/2019 0424   APPEARANCEUR CLEAR 10/25/2019 0424   APPEARANCEUR Clear 11/06/2014 1527   LABSPEC 1.009 10/25/2019 0424   LABSPEC 1.008 11/06/2014 1527   PHURINE 6.0 10/25/2019 0424   GLUCOSEU NEGATIVE 10/25/2019 0424   GLUCOSEU Negative 11/06/2014 1527   HGBUR NEGATIVE 10/25/2019 0424   BILIRUBINUR NEGATIVE 10/25/2019 0424   BILIRUBINUR Negative 11/06/2014 1527   KETONESUR NEGATIVE 10/25/2019 0424   PROTEINUR 100 (A) 10/25/2019 0424   NITRITE NEGATIVE 10/25/2019 0424   LEUKOCYTESUR NEGATIVE 10/25/2019 0424   LEUKOCYTESUR Negative 11/06/2014 1527   Sepsis Labs: @LABRCNTIP (procalcitonin:4,lacticacidven:4)  ) Recent Results (from the past 240 hour(s))  SARS Coronavirus 2 by RT PCR (hospital order, performed in [place] hospital lab) Nasopharyngeal Nasopharyngeal Swab     Status: None   Collection Time: 11/22/2019  2:05 PM   Specimen: Nasopharyngeal Swab  Result Value Ref Range Status   SARS Coronavirus 2 NEGATIVE NEGATIVE Final    Comment: (NOTE) SARS-CoV-2 target nucleic acids are NOT DETECTED. The SARS-CoV-2 RNA is generally detectable in upper and lower respiratory specimens during the acute phase of infection. The lowest concentration of SARS-CoV-2 viral copies this assay can detect is 250 copies / mL. A negative result does not preclude SARS-CoV-2 infection and should not be used as the sole basis for treatment or other patient management decisions.  A negative result may occur with improper specimen collection / handling, submission of specimen other than nasopharyngeal swab, presence of viral mutation(s) within the areas targeted by this assay, and inadequate number of viral copies (<250 copies /  mL). A negative result must be combined with clinical observations, patient history, and epidemiological information. Fact Sheet for Patients:   StrictlyIdeas.no Fact Sheet for Healthcare Providers: BankingDealers.co.za This test is not yet approved or cleared  by the [place] FDA and has been authorized for detection and/or diagnosis of SARS-CoV-2 by FDA under an Emergency Use Authorization (EUA).  This EUA will remain in effect (meaning this test can be used) for the duration of the COVID-19  declaration under Section 564(b)(1) of the Act, 21 U.S.C. section 360bbb-3(b)(1), unless the authorization is terminated or revoked sooner. Performed at [facility], [address] [place], [place] [address]   Culture, blood (x 2)     Status: None   Collection Time: 12/04/2019  8:54 PM   Specimen: BLOOD  Result Value Ref Range Status   Specimen Description BLOOD RAC  Final   Special Requests BOTTLES DRAWN AEROBIC AND ANAEROBIC BCAV  Final   Culture   Final    NO GROWTH 5 DAYS Performed at [facility], [address], [place], [place] [address]    Report Status 11/28/2019 FINAL  Final  Culture, blood (x 2)     Status: None   Collection Time: 12/02/2019  8:54 PM   Specimen: BLOOD  Result Value Ref Range Status   Specimen Description BLOOD [facility]  Final   Special Requests BOTTLES DRAWN AEROBIC AND ANAEROBIC BCAV  Final   Culture   Final    NO GROWTH 5 DAYS Performed at [facility], [address] [place], [place] [address]    Report Status 11/28/2019 FINAL  Final         Radiology Studies: No results found.      Scheduled Meds: .  stroke: mapping our early stages of recovery book   Does not apply Once  . amiodarone  200 mg Oral Daily  . apixaban  5 mg Oral BID  . aspirin  81 mg Oral Daily  . atorvastatin  80 mg Oral QPM  . ezetimibe  10 mg Oral Daily  . famotidine  20 mg Oral BID  . [START ON  11/19/2019] furosemide  40 mg Oral BID  . insulin aspart  0-15 Units Subcutaneous TID WC  . insulin aspart  0-5 Units Subcutaneous QHS  . insulin glargine  5 Units Subcutaneous Daily  . levETIRAcetam  500 mg Oral TID  . mouth rinse  15 mL Mouth Rinse BID  . metoprolol tartrate  37.5 mg Oral BID  . pantoprazole  40 mg Oral Daily  . potassium chloride  40 mEq Oral Daily  . pregabalin  50 mg Oral TID  . Ensure Max Protein  11 oz Oral BID  . venlafaxine  75 mg Oral TID   Continuous Infusions:    LOS: 5 days    Time spent: 25 minutes    Edwin Dada, MD [facility] 11/28/2019, 4:43 PM     Please page though Dexter or Epic secure chat:  For [employer], Adult nurse

## 2019-11-28 NOTE — Progress Notes (Signed)
Progress Note  Patient Name: Larry Terrell Date of Encounter: 11/28/2019  Primary Cardiologist: Ida Rogue, MD   Subjective   No CP, palpitations, or racing HR. Reports breathing OK - currently on 3L [place] oxygen but states that he does not use oxygen 24/7 at home. Denies any swelling.   Inpatient Medications    Scheduled Meds: .  stroke: mapping our early stages of recovery book   Does not apply Once  . amiodarone  200 mg Oral Daily  . apixaban  5 mg Oral BID  . aspirin  81 mg Oral Daily  . atorvastatin  80 mg Oral QPM  . ezetimibe  10 mg Oral Daily  . famotidine  20 mg Oral BID  . [START ON 12/06/2019] furosemide  40 mg Oral BID  . insulin aspart  0-15 Units Subcutaneous TID WC  . insulin aspart  0-5 Units Subcutaneous QHS  . insulin glargine  5 Units Subcutaneous Daily  . levETIRAcetam  500 mg Oral TID  . mouth rinse  15 mL Mouth Rinse BID  . metoprolol tartrate  37.5 mg Oral BID  . pantoprazole  40 mg Oral Daily  . potassium chloride  40 mEq Oral Daily  . pregabalin  50 mg Oral TID  . Ensure Max Protein  11 oz Oral BID  . venlafaxine  75 mg Oral TID   Continuous Infusions:  PRN Meds: acetaminophen **OR** acetaminophen, ondansetron **OR** ondansetron (ZOFRAN) IV   Vital Signs    Vitals:   11/27/19 1609 11/27/19 2143 11/28/19 0404 11/28/19 0752  BP: (!) 154/79 (!) 150/86 130/85 (!) 147/80  Pulse: 83 88 85 79  Resp: 19 12 14    Temp: 98.3 F (36.8 C) 98.4 F (36.9 C) 97.6 F (36.4 C) 98 F (36.7 C)  TempSrc:  Oral Oral   SpO2: 92% 91% 97% 93%  Weight:   83.6 kg   Height:        Intake/Output Summary (Last 24 hours) at 11/28/2019 1107 Last data filed at 11/28/2019 1013 Gross per 24 hour  Intake 480 ml  Output 3250 ml  Net -2770 ml   Last 3 Weights 11/28/2019 11/27/2019 11/26/2019  Weight (lbs) 184 lb 4.8 oz 189 lb 1.6 oz 188 lb 1.2 oz  Weight (kg) 83.598 kg 85.775 kg 85.31 kg      Telemetry    Afib with PVCs versus aberrancy, ventricular rate 80s  - Personally Reviewed  ECG    No new tracings - Personally Reviewed  Physical Exam   GEN: No acute distress.   Neck: No JVD Cardiac: IRIR, no murmurs, rubs, or gallops. Sternal incision inspected and without drainage or signs of infection though noted to be healing slower towards the distal edge of the incision with a gap noted between the approximated edges. Respiratory: Bibasilar reduced breath sounds and worse on L than R. GI: Soft, nontender, non-distended  MS: No edema; No deformity. Neuro:  Nonfocal  Psych: Normal affect   Labs    High Sensitivity Troponin:   Recent Labs  Lab 11/18/2019 1208 11/13/2019 1405  TROPONINIHS 10 10      Chemistry Recent Labs  Lab 12/02/2019 1208 12/05/2019 1208 11/24/19 0015 11/25/19 0520 11/26/19 0557 11/27/19 0515 11/28/19 0609  NA 139   < > 140   < > 141 140 141  K 4.5   < > 3.7   < > 3.6 3.4* 3.6  CL 103   < > 101   < > 99 95*  95*  CO2 25   < > 29   < > 32 33* 37*  GLUCOSE 180*   < > 259*   < > 162* 145* 176*  BUN 34*   < > 34*   < > 29* 29* 33*  CREATININE 1.72*   < > 1.60*   < > 1.23 1.26* 1.34*  CALCIUM 8.9   < > 8.6*   < > 8.4* 8.7* 8.9  PROT 6.9  --  6.7  --   --   --   --   ALBUMIN 3.1*  --  2.8*  --   --   --   --   AST 19  --  16  --   --   --   --   ALT 13  --  12  --   --   --   --   ALKPHOS 100  --  97  --   --   --   --   BILITOT 1.0  --  0.7  --   --   --   --   GFRNONAA 42*   < > 46*   < > >60 >60 57*  GFRAA 49*   < > 53*   < > >60 >60 >60  ANIONGAP 11   < > 10   < > 10 12 9    < > = values in this interval not displayed.     Hematology Recent Labs  Lab 11/26/19 0557 11/27/19 0515 11/28/19 0609  WBC 9.6 8.1 7.8  RBC 3.19* 3.27* 3.40*  HGB 9.6* 9.7* 10.0*  HCT 29.6* 30.8* 31.5*  MCV 92.8 94.2 92.6  MCH 30.1 29.7 29.4  MCHC 32.4 31.5 31.7  RDW 19.1* 18.8* 18.9*  PLT 173 197 209    BNP Recent Labs  Lab 11/27/2019 1208  BNP 273.0*     DDimer No results for input(s): DDIMER in the last 168 hours.    Radiology    No results found.  Cardiac Studies   Echo 11/27/2019 1. Left ventricular ejection fraction, by estimation, is 55 to 60%. The  left ventricle has normal function. The left ventricle has no regional  wall motion abnormalities. There is mild left ventricular hypertrophy.  Left ventricular diastolic parameters  are indeterminate.  2. Right ventricular systolic function is normal. The right ventricular  size is normal. There is normal pulmonary artery systolic pressure.  3. Left atrial size was moderately dilated.  4. Mild mitral valve regurgitation.  5. Images suggestive of left pleural effusion   RAP 38mHg, RVSP 33.878mg Small pericardial effusion  Echo 10/20/19 1. Left ventricular ejection fraction, by estimation, is 55%. The left  ventricle has low normal function. Grossly, no significant wall motion  abnormality. There is mild left ventricular hypertrophy of the septal  segment. Grade II diastolic dysfunction.  2. Right ventricular systolic function is normal. The right ventricular  size is normal. Tricuspid regurgitation signal is inadequate for assessing  PA pressure.  3. Left atrial size was moderately dilated.  [facility]: Coronary dominance: Right  Left mainstem: Large vessel that bifurcates into the LAD and left circumflex, no significant disease noted, unable to exclude mild distal left main disease, appears hazy  Left anterior descending (LAD): Large vessel that extends to the apical region, diagonal branch 2 of moderate size, severe proximal LAD disease at the takeoff of the diagonal estimated 95%, mild distal LAD disease  Left circumflex (LCx): Large vessel with OM  branch 2, proximal to mid left circumflex is occluded with in-stent restenosis OM seen distally, fills via collaterals High ramus with severe ostial disease estimated 90%  Right coronary artery (RCA): Right dominant vessel with PL and PDA, severe  in-stent restenosis of mid vessel stent, severe ostial PDA disease  Left ventriculography: Left ventricular systolic function is low normal, LVEF is estimated at 50%, there is no significant mitral regurgitation , no significant aortic valve stenosis Mild inferior wall hypokinesis  Final Conclusions:  Severe three-vessel coronary disease including Severe proximal LAD Severe mid RCA, in-stent restenosis , distal RCA Occluded proximal to mid left circumflex in-stent occlusion Severe ostial ramus disease Mild inferior wall hypokinesis  Recommendations:  Case discussed with interventional cardiology Given he has not been responding well to stenting as noted by occluded left circumflex stent, in-stent restenosis on the right in the setting of diabetes, would likely be a good candidate for CABG Given the severity of his symptoms and disease, will transfer to [facility] for further discussion and evaluation     Patient Profile     59 y.o. male with hx of CAD s/p remote PCI and CABG x5 10/2019, post op Afib on amiodarone and Eliquis, HFpEF (EF 55-60%), TIA, PAD s/p arterial bifemoral bypass 2013 with known stenosis of bilateral SFA, DM2, hypertension, hyperlipidemia, COPD on 3L [place] at home, and prior tobacco use who is being seen today for the evaluation of acute on chronic HFpEF.  Assessment & Plan    Respiratory distress Hypoxia --Improved breathing status. Presented s/p fall with AMS and hypoxia / elevated volume status on presentation. Known h/o Afib, which likely exacerbated volume status. Acute stroke found on MRI. S/p IV diuresis and BiPAP with improvement in his breathing / volume status.   Acute on chronic HFpEF --Denies SOB. Volume status significantly improved. Echo showed nl EF with RAP 8mHg, RVSP 33.887mg, and small pericardial effusion. --Monitor I/Os: Net -2.5L yesterday and -10.6 L for the admission. Wt 85.8kg  93.6kg. Monitor BMET: Overnight bump in renal function. Cr  1.26   1.34 and BUN 29  37.   --Transitioned to oral diuresis today with lasix 4046mID. Recommend discharge with oral KCl supplementation as well given hypokalemia this admission. Continue with BB and ACE. Will need follow-up scheduled in the office.   CVA Fall at presentation --Presented following a fall with AMS, now improved. CTA negative for stroke with subsequent MRI showing acute stroke. Telemetry shows known Afib and frequent PVCs. Could consider Zio at discharge.  Consider also fall 2/2 hypoxia and volume overload. Also consider deconditioning / weakness. Repeat echo as above with nl EF and no RWMA. --Due to deconditioning, agree with OT/PT and likely SNF placement at discharge. Continue ASA and Eliquis.  CAD s/p CABG --No CP. EKG without acute ST/T changes. HS Tn not consistent with ACS. No indication for further ischemic workup.  --Midsternal incision inspected today and without any signs of infection during dressing change. Slower healing noted towards the distal part of the incision. --Continue GDMT with BB, ACE, and statin.  Continue statin for risk factor modification.   HTN --BP controlled. Continue current medications.   Persistent Atrial fibrillation --Known Afib and first noted s/p CABG 10/2019. --Continue BB for rate control. Continue amiodarone, recently reduced as an outpatient to 200m26mily. Monitor lung, liver, and thyroid function.  --Continue Eliquis 5mg 26m for CHA2DS2VASc score of at least 3. No s/sx of bleeding. Daily CBC. Could consider cardioversion in the future and as an  outpatient - will defer for now.   HLD --Continue statin and Zetia. Due for repeat lipid and liver function in 6-8 weeks.   PAD --Continue ASA, statin, and Zetia.  Anemia --Baseline Hgb also low and HCT stable. Consider anemia of chronic dz. Per IM.  CKD --Daily BMET. Renal function improved with diuresis.  Hypokalemia --Replete with goal 4.0 on diuresis. Will likely need oral  potassium supplementation at discharge with oral diuretic.   COPD --Previous long smoking history on 3L [place]. Consider that this contributes to breathing issues.    For questions or updates, please contact [place] Please consult [email or web address] for contact info under        Signed, Arvil Chaco, PA-C  11/28/2019, 11:07 AM

## 2019-11-28 NOTE — Progress Notes (Signed)
Physical Therapy Treatment Patient Details Name: Larry Terrell MRN: [id number] DOB: [birth year] Today's Date: 11/28/2019    History of Present Illness Pt is a 59 y.o. M with hx CAD s/p PCI x2 2015 and CAB[facility] last [facility], postop A. fib on amiodarone and Eliquis, COPD on 2 L home O2, depression, chronic pain, DM, HTN, CHF EF 45%, obesity, and PVD status post aortofemoral bypass, CKD 3B baseline creatinine 1.7, and NASH who presents with fall, and confusion placed on bipap in ED. MRI + for Acute left PCA territory infarct, Punctate focus of restricted diffusion in the left corona radiata, consistent with acute infarct. 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    PT Comments    Pt was asleep in long sitting upon arriving. He easily awakes but overall is more lethargic this date from previously observed by therapist on 5/15. He is disoriented and pleasantly confused. Was on 2 L o2 throughout session with only one occasion of desat to 88% in standing that quickly recovers to 94% in sitting. Pt has poor insight of deficits and wants to DC to home from hospsital however with encouragement and education does agree that rehab is safest option and reports willing to go when stable. Pt has several occasions in which alertness wax/weans. He was able to inconsistently follow commands but was agreeable to OOB to recliner. HR much more stable this date than previously session. He is seated in recliner with BLEs elevated, call bell in lab, chair alarm in place, and RN aware of pt's abilities. Acute PT will continue to follow per current POC progressing as able per pt tolerance.    Follow Up Recommendations  SNF     Equipment Recommendations  Other (comment)(defer to next level of care)    Recommendations for Other Services       Precautions / Restrictions Precautions Precautions: Sternal;Fall Precaution Booklet Issued: No Precaution Comments: pt unable to recall sternal  precautions. Restrictions Weight Bearing Restrictions: No    Mobility  Bed Mobility Overal bed mobility: Needs Assistance Bed Mobility: Supine to Sit     Supine to sit: Mod assist;HOB elevated     General bed mobility comments: Pt required mod assist to safely exit L sid eof bed. max vcs throughout for technique and sequencing. pt slightly impulsive and sits up without performing recommended technique. pt's cognition varied throughout session. he was appriate at times however has episodes of increased confusion at times as well.  Transfers Overall transfer level: Needs assistance Equipment used: Rolling walker (2 wheeled) Transfers: Sit to/from Stand Sit to Stand: Min assist;Mod assist         General transfer comment: Pt wanted to try to stand without assistance to try to prove he can safely go home. while holding pillow to adhere to sternal precautions, pt was unable. Required min assist + max vcs to achieve standing. staood and took sveral steps to recliner with incraesed time and Vcs throughout. sao2 desats to 88% in standing but quickly recovers to 93% with seated rest and breathing techniques.  Ambulation/Gait Ambulation/Gait assistance: Min assist Gait Distance (Feet): 3 Feet Assistive device: Rolling walker (2 wheeled) Gait Pattern/deviations: Decreased stride length;Trunk flexed;Shuffle     General Gait Details: unsafe 2/2 to cognition and lethargy    Stairs             Wheelchair Mobility    Modified Rankin (Stroke Patients Only)       Balance  Cognition Arousal/Alertness: Awake/alert Behavior During Therapy: Flat affect(lethargic) Overall Cognitive Status: History of cognitive impairments - at baseline Area of Impairment: Orientation;Attention;Memory;Following commands;Safety/judgement;Awareness;Problem solving                 Orientation Level: Disoriented  to;Time;Place;Situation Current Attention Level: Selective Memory: Decreased recall of precautions;Decreased short-term memory Following Commands: Follows one step commands inconsistently Safety/Judgement: Decreased awareness of safety;Decreased awareness of deficits Awareness: Intellectual Problem Solving: Slow processing;Decreased initiation;Difficulty sequencing;Requires verbal cues;Requires tactile cues General Comments: Pt continues to be disoriented and confused with lack of insight of deficits. Overall more lethargic this date than previously observed on 5/15      Exercises      General Comments        Pertinent Vitals/Pain Pain Assessment: No/denies pain Pain Score: 0-No pain Faces Pain Scale: No hurt Pain Intervention(s): Limited activity within patient's tolerance;Monitored during session;Premedicated before session;Repositioned    Home Living                      Prior Function            PT Goals (current goals can now be found in the care plan section) Acute Rehab PT Goals Patient Stated Goal: none stated Progress towards PT goals: Progressing toward goals    Frequency    7X/week      PT Plan Current plan remains appropriate    Co-evaluation     PT goals addressed during session: Mobility/safety with mobility;Proper use of DME;Balance;Strengthening/ROM        AM-PAC PT "6 Clicks" Mobility   Outcome Measure  Help needed turning from your back to your side while in a flat bed without using bedrails?: A Lot Help needed moving from lying on your back to sitting on the side of a flat bed without using bedrails?: A Lot Help needed moving to and from a bed to a chair (including a wheelchair)?: A Lot Help needed standing up from a chair using your arms (e.g., wheelchair or bedside chair)?: A Lot Help needed to walk in hospital room?: A Lot Help needed climbing 3-5 steps with a railing? : A Lot 6 Click Score: 12    End of Session Equipment  Utilized During Treatment: Gait belt;Oxygen(2 L o2 throughout session) Activity Tolerance: Patient tolerated treatment well Patient left: in chair;with chair alarm set;with call bell/phone within reach Nurse Communication: Mobility status PT Visit Diagnosis: Unsteadiness on feet (R26.81);Other abnormalities of gait and mobility (R26.89);Muscle weakness (generalized) (M62.81);Difficulty in walking, not elsewhere classified (R26.2)     Time: 6063-0160 PT Time Calculation (min) (ACUTE ONLY): 27 min  Charges:  $Therapeutic Activity: 23-37 mins                     Julaine Fusi PTA 11/28/19, 1:49 PM

## 2019-11-28 NOTE — Progress Notes (Signed)
    [facility] has been requested to perform a transesophageal echocardiogram on Mr. Larry Terrell for recent stroke.  After careful review of history and examination, the risks and benefits of transesophageal echocardiogram have been explained including risks of esophageal damage, perforation (1:10,000 risk), bleeding, pharyngeal hematoma as well as other potential complications associated with conscious sedation including aspiration, arrhythmia, respiratory failure and death. Alternatives to treatment were discussed, questions were answered. Patient is willing to proceed.   Patient's sister, Ms. Rhyatt Muska, was present at the time of explanation of this procedure. She has requested a call following the procedure to let her know that it has successfully been completed. [phone].  Arvil Chaco, PA-C 11/28/2019 4:44 PM

## 2019-11-29 ENCOUNTER — Telehealth: Payer: Self-pay | Admitting: Physician Assistant

## 2019-11-29 ENCOUNTER — Encounter: Admission: EM | Disposition: E | Payer: Self-pay | Source: Home / Self Care | Attending: Family Medicine

## 2019-11-29 ENCOUNTER — Encounter: Payer: Self-pay | Admitting: Family Medicine

## 2019-11-29 ENCOUNTER — Inpatient Hospital Stay (HOSPITAL_COMMUNITY)
Admit: 2019-11-29 | Discharge: 2019-11-29 | Disposition: A | Discharge status: Home / Self Care | Payer: Medicare HMO | Attending: Physician Assistant | Admitting: Physician Assistant

## 2019-11-29 ENCOUNTER — Inpatient Hospital Stay: Payer: Medicare HMO

## 2019-11-29 DIAGNOSIS — I6389 Other cerebral infarction: Secondary | ICD-10-CM

## 2019-11-29 DIAGNOSIS — I340 Nonrheumatic mitral (valve) insufficiency: Secondary | ICD-10-CM

## 2019-11-29 DIAGNOSIS — I639 Cerebral infarction, unspecified: Secondary | ICD-10-CM

## 2019-11-29 DIAGNOSIS — I361 Nonrheumatic tricuspid (valve) insufficiency: Secondary | ICD-10-CM

## 2019-11-29 HISTORY — PX: TEE WITHOUT CARDIOVERSION: SHX5443

## 2019-11-29 LAB — BLOOD GAS, ARTERIAL
Acid-Base Excess: 9.9 mmol/L — ABNORMAL HIGH (ref 0.0–2.0)
Bicarbonate: 36 mmol/L — ABNORMAL HIGH (ref 20.0–28.0)
FIO2: 0.52
O2 Saturation: 94.4 %
Patient temperature: 37
pCO2 arterial: 53 mmHg — ABNORMAL HIGH (ref 32.0–48.0)
pH, Arterial: 7.44 (ref 7.350–7.450)
pO2, Arterial: 70 mmHg — ABNORMAL LOW (ref 83.0–108.0)

## 2019-11-29 LAB — GLUCOSE, CAPILLARY
Glucose-Capillary: 157 mg/dL — ABNORMAL HIGH (ref 70–99)
Glucose-Capillary: 174 mg/dL — ABNORMAL HIGH (ref 70–99)
Glucose-Capillary: 217 mg/dL — ABNORMAL HIGH (ref 70–99)
Glucose-Capillary: 210 mg/dL — ABNORMAL HIGH (ref 70–99)
Glucose-Capillary: 196 mg/dL — ABNORMAL HIGH (ref 70–99)

## 2019-11-29 LAB — CBC
HCT: 29.1 % — ABNORMAL LOW (ref 39.0–52.0)
HCT: 33.5 % — ABNORMAL LOW (ref 39.0–52.0)
Hemoglobin: 9.6 g/dL — ABNORMAL LOW (ref 13.0–17.0)
Hemoglobin: 11.1 g/dL — ABNORMAL LOW (ref 13.0–17.0)
MCH: 30.1 pg (ref 26.0–34.0)
MCH: 29.8 pg (ref 26.0–34.0)
MCHC: 33 g/dL (ref 30.0–36.0)
MCHC: 33.1 g/dL (ref 30.0–36.0)
MCV: 91.2 fL (ref 80.0–100.0)
MCV: 90.1 fL (ref 80.0–100.0)
Platelets: 177 10*3/uL (ref 150–400)
Platelets: 237 10*3/uL (ref 150–400)
RBC: 3.19 MIL/uL — ABNORMAL LOW (ref 4.22–5.81)
RBC: 3.72 MIL/uL — ABNORMAL LOW (ref 4.22–5.81)
RDW: 18.9 % — ABNORMAL HIGH (ref 11.5–15.5)
RDW: 19 % — ABNORMAL HIGH (ref 11.5–15.5)
WBC: 7.6 10*3/uL (ref 4.0–10.5)
WBC: 15.8 10*3/uL — ABNORMAL HIGH (ref 4.0–10.5)
nRBC: 0 % (ref 0.0–0.2)
nRBC: 0 % (ref 0.0–0.2)

## 2019-11-29 LAB — BASIC METABOLIC PANEL
Anion gap: 9 (ref 5–15)
Anion gap: 11 (ref 5–15)
BUN: 34 mg/dL — ABNORMAL HIGH (ref 8–23)
BUN: 31 mg/dL — ABNORMAL HIGH (ref 8–23)
CO2: 35 mmol/L — ABNORMAL HIGH (ref 22–32)
CO2: 33 mmol/L — ABNORMAL HIGH (ref 22–32)
Calcium: 8.7 mg/dL — ABNORMAL LOW (ref 8.9–10.3)
Calcium: 9 mg/dL (ref 8.9–10.3)
Chloride: 95 mmol/L — ABNORMAL LOW (ref 98–111)
Chloride: 95 mmol/L — ABNORMAL LOW (ref 98–111)
Creatinine, Ser: 1.37 mg/dL — ABNORMAL HIGH (ref 0.61–1.24)
Creatinine, Ser: 1.21 mg/dL (ref 0.61–1.24)
GFR calc Af Amer: >60 mL/min (ref >60)
GFR calc Af Amer: >60 mL/min (ref >60)
GFR calc non Af Amer: 55 mL/min — ABNORMAL LOW (ref >60)
GFR calc non Af Amer: >60 mL/min (ref >60)
Glucose, Bld: 172 mg/dL — ABNORMAL HIGH (ref 70–99)
Glucose, Bld: 216 mg/dL — ABNORMAL HIGH (ref 70–99)
Potassium: 3.6 mmol/L (ref 3.5–5.1)
Potassium: 4.1 mmol/L (ref 3.5–5.1)
Sodium: 139 mmol/L (ref 135–145)
Sodium: 139 mmol/L (ref 135–145)

## 2019-11-29 LAB — PROCALCITONIN: Procalcitonin: 0.12 ng/mL

## 2019-11-29 LAB — TROPONIN I (HIGH SENSITIVITY): Troponin I (High Sensitivity): 12 ng/L (ref <18)

## 2019-11-29 SURGERY — ECHOCARDIOGRAM, TRANSESOPHAGEAL
Anesthesia: Moderate Sedation

## 2019-11-29 MED ORDER — FENTANYL CITRATE (PF) 100 MCG/2ML IJ SOLN
INTRAMUSCULAR | Status: AC | PRN
  Administered 2019-11-29 (×2): 25 ug via INTRAVENOUS

## 2019-11-29 MED ORDER — MORPHINE SULFATE (PF) 2 MG/ML IV SOLN
2.0000 mg | INTRAVENOUS | Status: DC | PRN
  Administered 2019-11-30 – 2019-12-01 (×3): 2 mg via INTRAVENOUS
  Filled 2019-11-29 (×4): qty 1

## 2019-11-29 MED ORDER — FENTANYL CITRATE (PF) 100 MCG/2ML IJ SOLN
INTRAMUSCULAR | Status: AC
  Filled 2019-11-29: qty 2

## 2019-11-29 MED ORDER — SODIUM CHLORIDE 0.9 % IV SOLN
1.0000 g | Freq: Three times a day (TID) | INTRAVENOUS | Status: DC
  Administered 2019-11-30: 1 g via INTRAVENOUS
  Filled 2019-11-29 (×3): qty 1

## 2019-11-29 MED ORDER — NALOXONE HCL 0.4 MG/ML IJ SOLN
0.4000 mg | INTRAMUSCULAR | Status: DC | PRN
  Administered 2019-11-29: 0.4 mg via INTRAVENOUS
  Filled 2019-11-29: qty 1

## 2019-11-29 MED ORDER — BUTAMBEN-TETRACAINE-BENZOCAINE 2-2-14 % EX AERO
INHALATION_SPRAY | CUTANEOUS | Status: AC
  Filled 2019-11-29: qty 5

## 2019-11-29 MED ORDER — LIDOCAINE VISCOUS HCL 2 % MT SOLN
OROMUCOSAL | Status: AC
  Filled 2019-11-29: qty 15

## 2019-11-29 MED ORDER — MIDAZOLAM HCL 2 MG/2ML IJ SOLN
INTRAMUSCULAR | Status: AC | PRN
  Administered 2019-11-29 (×2): 1 mg via INTRAVENOUS

## 2019-11-29 MED ORDER — OXYCODONE HCL 5 MG PO TABS
5.0000 mg | ORAL_TABLET | Freq: Four times a day (QID) | ORAL | Status: DC | PRN
  Administered 2019-11-29 – 2019-11-30 (×2): 5 mg via ORAL
  Filled 2019-11-29 (×3): qty 1

## 2019-11-29 MED ORDER — MIDAZOLAM HCL 5 MG/5ML IJ SOLN
INTRAMUSCULAR | Status: AC
  Filled 2019-11-29: qty 5

## 2019-11-29 MED ORDER — OXYCODONE HCL 5 MG PO TABS
5.0000 mg | ORAL_TABLET | Freq: Once | ORAL | Status: AC
  Administered 2019-11-29: 5 mg via ORAL

## 2019-11-29 MED ORDER — ONDANSETRON HCL 4 MG/2ML IJ SOLN
INTRAMUSCULAR | Status: AC
  Administered 2019-11-29: 4 mg via INTRAVENOUS
  Filled 2019-11-29: qty 2

## 2019-11-29 NOTE — Progress Notes (Signed)
Physical Therapy Treatment Patient Details Name: Larry Terrell MRN: [id number] DOB: 1960/11/06 Today's Date: 12/09/2019    History of Present Illness Pt is a 59 y.o. M with hx CAD s/p PCI x2 2015 and CABG x5 last month at [facility], postop A. fib on amiodarone and Eliquis, COPD on 2 L home O2, depression, chronic pain, DM, HTN, CHF EF 45%, obesity, and PVD status post aortofemoral bypass, CKD 3B baseline creatinine 1.7, and NASH who presents with fall, and confusion placed on bipap in ED. MRI + for Acute left PCA territory infarct, Punctate focus of restricted diffusion in the left corona radiata, consistent with acute infarct. 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    PT Comments    Pt was supine in bed with RN/RN tech in room assisting with bed linen change. Pt is very lethargic and much more flat. He only responds to questions with increased time, repetition, and encouragement. Pt does reports back pain and endorses fatigue. On 5 L O2 [place] throughout session. With attempt to wean, pt de-sats to 84%. RN in room to give medications. Pt agreeable to sit EOB to take. He required increased assistance to perform rolling L to short sit. A lot of time and cueing throughout for technique and safety. Unable to recall sternal precautions. Upon sitting EOB, pt was unable to maintain sitting balance. More severe L lateral lean throughout. Due to increased fatigue this date? He was only able to tolerate sitting EOB x 5 minute prior to requesting to return to supine. HR in A-fib but maintained ~ 100 bpm during sitting EOB activity. He required total assist to return to supine and reposition to [facility]. Pt much more limited today by cognition/fatigue/lethargy. Will continue to follow as able per POC. Pt quickly falls asleep once repositioned in bed. Recommend DC to SNF when medically stable.    Follow Up Recommendations  SNF     Equipment Recommendations  Other (comment)(defer to next level of care)     Recommendations for Other Services       Precautions / Restrictions Precautions Precautions: Sternal;Fall Precaution Booklet Issued: No Precaution Comments: pt unable to recall sternal precautions. Restrictions Weight Bearing Restrictions: No    Mobility  Bed Mobility Overal bed mobility: Needs Assistance Bed Mobility: Supine to Sit Rolling: +2 for safety/equipment;Mod assist Sidelying to sit: Mod assist;Max assist;+2 for safety/equipment Supine to sit: Mod assist;Max assist;+2 for safety/equipment Sit to supine: Total assist;+2 for safety/equipment(unable to progress back to supine via log roll without total) Sit to sidelying: Total assist;+2 for safety/equipment General bed mobility comments: Pt required increased assistance to perform all mobility this date and much more assistance to maintain sitting balance EOB. severe L lateral lean. mod/max assist to roll L to short sit with increased time and max vcs for progression. Pt much more flat today with less coversations/ participation overall. does reports back pain throughout session and states fatigue from procedure earlier and lack of eating. Lunch tray ordered prior to PT session.  Transfers                 General transfer comment: pt unwilling 2/2 to pain and fatigue. severe L lateral lean in sitting with BUE/BLE support.  Ambulation/Gait                 Stairs             Wheelchair Mobility    Modified Rankin (Stroke Patients Only)       Balance  Overall balance assessment: Needs assistance Sitting-balance support: Feet supported;Bilateral upper extremity supported Sitting balance-Leahy Scale: Poor Sitting balance - Comments: pt unable to maintain sitting balance with BLe support/BUE support. severe loss of balance L: with constant assist + vcs for R lateral wt shift. Pt reports feeling more fatigued this date. sat EOB x 5 minutes prior to requesting to lay back down.        Standing  balance comment: unsafe to trial today 2/2 to decreased abilitiy to maintain sitting balance.                            Cognition Arousal/Alertness: Lethargic Behavior During Therapy: Flat affect Overall Cognitive Status: History of cognitive impairments - at baseline Area of Impairment: Orientation;Attention;Memory;Following commands;Safety/judgement;Awareness;Problem solving                 Orientation Level: Situation;Time Current Attention Level: Alternating Memory: Decreased recall of precautions;Decreased short-term memory Following Commands: Follows one step commands inconsistently Safety/Judgement: Decreased awareness of safety;Decreased awareness of deficits Awareness: Intellectual Problem Solving: Slow processing;Decreased initiation;Difficulty sequencing;Requires verbal cues;Requires tactile cues General Comments: pt much more lethargic throughout session today. He was less conversational and more flat. Was unable to recall sternal precautions and was unwilling to trial OOB activity. RN was in room to give medication so pt agrees to sitting up EOB to take pills.      Exercises      General Comments        Pertinent Vitals/Pain Pain Assessment: 0-10 Pain Score: 7  Pain Location: back pain Pain Descriptors / Indicators: Discomfort;Aching Pain Intervention(s): Limited activity within patient's tolerance;Monitored during session;Premedicated before session;Repositioned    Home Living                      Prior Function            PT Goals (current goals can now be found in the care plan section) Acute Rehab PT Goals Patient Stated Goal: none stated Progress towards PT goals: Not progressing toward goals - comment(fatigue, pain, and cognition slowing progression)    Frequency    7X/week      PT Plan Current plan remains appropriate    Co-evaluation     PT goals addressed during session: Strengthening/ROM;Proper use of  DME;Mobility/safety with mobility;Balance        AM-PAC PT "6 Clicks" Mobility   Outcome Measure  Help needed turning from your back to your side while in a flat bed without using bedrails?: A Lot Help needed moving from lying on your back to sitting on the side of a flat bed without using bedrails?: A Lot Help needed moving to and from a bed to a chair (including a wheelchair)?: A Lot Help needed standing up from a chair using your arms (e.g., wheelchair or bedside chair)?: A Lot Help needed to walk in hospital room?: A Lot Help needed climbing 3-5 steps with a railing? : A Lot 6 Click Score: 12    End of Session Equipment Utilized During Treatment: Gait belt;Oxygen(% L [place] throughout. Does desat with attempt to wean. RN aware) Activity Tolerance: Patient limited by fatigue;Patient limited by pain Patient left: in bed;with call bell/phone within reach;with bed alarm set;with nursing/sitter in room Nurse Communication: Mobility status PT Visit Diagnosis: Unsteadiness on feet (R26.81);Other abnormalities of gait and mobility (R26.89);Muscle weakness (generalized) (M62.81);Difficulty in walking, not elsewhere classified (R26.2)     Time: 2376-2831 PT Time  Calculation (min) (ACUTE ONLY): 19 min  Charges:  $Therapeutic Activity: 8-22 mins                     Julaine Fusi PTA 12/06/2019, 2:10 PM

## 2019-11-29 NOTE — Progress Notes (Signed)
Placed patient on cpap 10 45%. Tolerated interventions well. Skin breakdown noted on nose.  Gel cushion applied as guard.

## 2019-11-29 NOTE — Progress Notes (Signed)
The patient is restless, lethargy/drawsy and confused, a changed from his baseline. 02 sustaining in the upper 80 s on 5 L/[place]. PB also elevated as indicated in the flow sheet. Called rapid respose and Cheree Ditto NP. Patient was Narcan per protocol and also received mupltile prders from the NP-see orders and intervention in epic. Brother at bedside updated on patient's condition.Will continue to monitor.

## 2019-11-29 NOTE — Progress Notes (Signed)
*  PRELIMINARY RESULTS* Echocardiogram Echocardiogram Transesophageal has been performed.  Larry Terrell 12/05/2019, 10:33 AM

## 2019-11-29 NOTE — Significant Event (Signed)
Rapid Response Event Note  Overview: Time Called: 2017 Arrival Time: 2019 Event Type: Neurologic, Respiratory  Initial Focused Assessment: RR called for pt with AMS, decreased O2 sats.  Patient is oriented to self and place, answers questions appropriately.  O2 sats 86% on 5LNC however pt does not appear to be in any respiratory distress.  Interventions: Patient placed on 6L HFNC by RT. Chest xray, ABG, and narcan ordered per Rapid Response standing orders.  Sharion Settler, NP at bedside at 2035 to assess pt.  Abd xray, EKG, and labs ordered per NP.    Plan of Care (if not transferred): Recommend bladder scan since pt has not voided after lasix administration.  NP at bedside.  Please call if further assistance is needed.   Event Summary: Name of Physician Notified: Sharion Settler, NP at [place] in room and stabilized  Event End Time: 2100  Zettie Pho

## 2019-11-29 NOTE — Progress Notes (Signed)
PT Cancellation Note  Patient Details Name: Larry Terrell MRN: [id number] DOB: 08/07/1960   Cancelled Treatment:    Reason Eval/Treat Not Completed: Patient at procedure or test/unavailable.  Pt currently off floor for procedure.  Will re-attempt PT treatment at a later date/time as medically appropriate.  Leitha Bleak, PT 11/24/2019, 9:53 AM

## 2019-11-29 NOTE — Telephone Encounter (Signed)
   Updated patient's sister on TEE results and that there was no evidence of thrombus, as well as the current plan for cardiac standpoint. She voiced her appreciation.   She requested that someone update her once we know which SNF he will be discharged to and this message was relayed to IM, who will loop in Case Management regarding this request.

## 2019-11-29 NOTE — Progress Notes (Addendum)
PROGRESS NOTE    Larry Terrell  EPP:[id number] DOB: 06-16-1961 DOA: 11/19/2019 PCP: [facility]    Assessment & Plan:   Principal Problem:   Acute on chronic heart failure with preserved ejection fraction (HFpEF) (HCC) Active Problems:   Diabetes mellitus with complication (HCC)   CAD in native artery   Essential hypertension   PAD (peripheral artery disease) (HCC)   Encephalopathy   Persistent atrial fibrillation (HCC)   Acute on chronic hypoxic respiratory failure: secondary to chronic systolic and diastolic CHF.  Echo during hospitalization for CABG last month showed EF 45%. Not on ARB due to renal dysfunction.Continue on supplemental oxygen. Per records from last hospital stay and interim office notes, he was discharged after CABG at [facility] on 3L.  Continue on lasix, plan for d/c on 45m BID. Strict I/Os, daily weights. 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 On admission, patient noted to be confused, no focal deficits noted, but concern for vision changes. Not tPA candidate given on eliquis. MRI obtained due to sudden mental status change shows new left PCA infarct involving the splenium of the corpus callosum, posterior aspect of the left fornix, left occipital lobe, left medial temporal lobe, and body of the left hippocampus. Echo showed no cardiogenic source of embolism. Continue atorvastatin, zetia, aspirin & eliquis. Hx of a. Fib. TEE 11/16/2019 did not show a thrombus as per cardio   Acute metabolic encephalopathy: superimposed on baseline cognitive impairment.  PT/OT recs SNF.  GERD: continue on famotidine, pantoprazole  CKD stage IIIb: Cr is labile. Will continue to monitor   DM2: continue lantus, SSI w/ accuchecks.   COPD: w/o exacerbation. Continue w/ supportive care  Depression: severity unknown. Continue venlafaxine, pregabalin   PAF: s/p CABG. Continue amiodarone and eliquis as per cardio  History of  seizures: continue keppra   DVT prophylaxis: eliquis Code Status: full  Family Communication: called pt's sister, KMaudie Mercury no answer so I left a message  Disposition Plan: will likely d/c to SNF. CM is working on this    Consultants:   Cardio    Procedures: TEE 11/19/2019 no thrombus noted   Antimicrobials:   Subjective: Pt c/o fatigue. Pt is lethargic after anesthesia for TEE   Objective: Vitals:   11/28/19 1944 11/21/2019 0500 11/20/2019 0527 11/22/2019 0813  BP: (!) 142/83  (!) 141/82 (!) 153/62  Pulse: 84  67 71  Resp: 19 15 19 17   Temp: 97.7 F (36.5 C)  97.6 F (36.4 C) 97.9 F (36.6 C)  TempSrc: Oral  Oral   SpO2: 94%  96% 93%  Weight:   83.5 kg   Height:        Intake/Output Summary (Last 24 hours) at 11/18/2019 0832 Last data filed at 12/09/2019 0700 Gross per 24 hour  Intake 270 ml  Output 1600 ml  Net -1330 ml   Filed Weights   11/27/19 0638 11/28/19 0404 11/19/2019 0527  Weight: 85.8 kg 83.6 kg 83.5 kg    Examination:  General exam: Appears calm and comfortable  Respiratory system: Clear to auscultation. Respiratory effort normal. Cardiovascular system: S1 & S2 +. No rubs, gallops or clicks.. Gastrointestinal system: Abdomen is nondistended, soft and nontender. Normal bowel sounds heard. Central nervous system: Lethargic after anesthesia.  Psychiatry: Judgement and insight appear normal. Flat mood and affect      Data Reviewed: I have personally reviewed following labs and imaging studies  CBC: Recent Labs  Lab  11/20/2019 1208 11/24/19 0015 11/25/19 0520 11/26/19 0557 11/27/19 0515 11/28/19 0609 11/11/2019 0434  WBC 8.8   < > 8.2 9.6 8.1 7.8 7.6  NEUTROABS 7.3  --   --   --   --   --   --   HGB 9.9*   < > 10.1* 9.6* 9.7* 10.0* 9.6*  HCT 32.0*   < > 31.4* 29.6* 30.8* 31.5* 29.1*  MCV 97.0   < > 94.0 92.8 94.2 92.6 91.2  PLT 215   < > 193 173 197 209 177   < > = values in this interval not displayed.   Basic Metabolic Panel: Recent Labs  Lab  11/25/19 0520 11/26/19 0557 11/27/19 0515 11/28/19 0609 11/18/2019 0434  NA 141 141 140 141 139  K 3.9 3.6 3.4* 3.6 3.6  CL 102 99 95* 95* 95*  CO2 31 32 33* 37* 35*  GLUCOSE 212* 162* 145* 176* 172*  BUN 27* 29* 29* 33* 34*  CREATININE 1.29* 1.23 1.26* 1.34* 1.37*  CALCIUM 8.7* 8.4* 8.7* 8.9 8.7*   GFR: Estimated Creatinine Clearance: 58.5 mL/min (A) (by C-G formula based on SCr of 1.37 mg/dL (H)). Liver Function Tests: Recent Labs  Lab 11/21/2019 1208 11/24/19 0015  AST 19 16  ALT 13 12  ALKPHOS 100 97  BILITOT 1.0 0.7  PROT 6.9 6.7  ALBUMIN 3.1* 2.8*   No results for input(s): LIPASE, AMYLASE in the last 168 hours. Recent Labs  Lab 12/09/2019 1951  AMMONIA <9*   Coagulation Profile: Recent Labs  Lab 11/22/2019 1208  INR 1.9*   Cardiac Enzymes: No results for input(s): CKTOTAL, CKMB, CKMBINDEX, TROPONINI in the last 168 hours. BNP (last 3 results) No results for input(s): PROBNP in the last 8760 hours. HbA1C: No results for input(s): HGBA1C in the last 72 hours. CBG: Recent Labs  Lab 11/28/19 0744 11/28/19 1132 11/28/19 1633 11/28/19 2106 11/27/2019 0828  GLUCAP 149* 221* 183* 175* 157*   Lipid Profile: No results for input(s): CHOL, HDL, LDLCALC, TRIG, CHOLHDL, LDLDIRECT in the last 72 hours. Thyroid Function Tests: No results for input(s): TSH, T4TOTAL, FREET4, T3FREE, THYROIDAB in the last 72 hours. Anemia Panel: No results for input(s): VITAMINB12, FOLATE, FERRITIN, TIBC, IRON, RETICCTPCT in the last 72 hours. Sepsis Labs: Recent Labs  Lab 12/05/2019 1305 12/04/2019 2054 11/24/19 0015  PROCALCITON <0.10  --   --   LATICACIDVEN  --  1.7 1.8    Recent Results (from the past 240 hour(s))  SARS Coronavirus 2 by RT PCR (hospital order, performed in [facility] hospital lab) Nasopharyngeal Nasopharyngeal Swab     Status: None   Collection Time: 12/09/2019  2:05 PM   Specimen: Nasopharyngeal Swab  Result Value Ref Range Status   SARS Coronavirus 2 NEGATIVE  NEGATIVE Final    Comment: (NOTE) SARS-CoV-2 target nucleic acids are NOT DETECTED. The SARS-CoV-2 RNA is generally detectable in upper and lower respiratory specimens during the acute phase of infection. The lowest concentration of SARS-CoV-2 viral copies this assay can detect is 250 copies / mL. A negative result does not preclude SARS-CoV-2 infection and should not be used as the sole basis for treatment or other patient management decisions.  A negative result may occur with improper specimen collection / handling, submission of specimen other than nasopharyngeal swab, presence of viral mutation(s) within the areas targeted by this assay, and inadequate number of viral copies (<250 copies / mL). A negative result must be combined with clinical observations, patient history, and  epidemiological information. Fact Sheet for Patients:   StrictlyIdeas.no Fact Sheet for Healthcare Providers: BankingDealers.co.za This test is not yet approved or cleared  by the [place] FDA and has been authorized for detection and/or diagnosis of SARS-CoV-2 by FDA under an Emergency Use Authorization (EUA).  This EUA will remain in effect (meaning this test can be used) for the duration of the COVID-19 declaration under Section 564(b)(1) of the Act, 21 U.S.C. section 360bbb-3(b)(1), unless the authorization is terminated or revoked sooner. Performed at [facility], [address] [place], [place] [address]   Culture, blood (x 2)     Status: None   Collection Time: 12/06/2019  8:54 PM   Specimen: BLOOD  Result Value Ref Range Status   Specimen Description BLOOD RAC  Final   Special Requests BOTTLES DRAWN AEROBIC AND ANAEROBIC BCAV  Final   Culture   Final    NO GROWTH 5 DAYS Performed at [facility], [address], [place], [place] [address]    Report Status 11/28/2019 FINAL  Final  Culture, blood (x 2)     Status: None    Collection Time: 12/02/2019  8:54 PM   Specimen: BLOOD  Result Value Ref Range Status   Specimen Description BLOOD BRH  Final   Special Requests BOTTLES DRAWN AEROBIC AND ANAEROBIC BCAV  Final   Culture   Final    NO GROWTH 5 DAYS Performed at [facility], [address] [place], [place] [address]    Report Status 11/28/2019 FINAL  Final  SARS CORONAVIRUS 2 (TAT 6-24 HRS) Nasopharyngeal Nasopharyngeal Swab     Status: None   Collection Time: 11/28/19 11:39 AM   Specimen: Nasopharyngeal Swab  Result Value Ref Range Status   SARS Coronavirus 2 NEGATIVE NEGATIVE Final    Comment: (NOTE) SARS-CoV-2 target nucleic acids are NOT DETECTED. The SARS-CoV-2 RNA is generally detectable in upper and lower respiratory specimens during the acute phase of infection. Negative results do not preclude SARS-CoV-2 infection, do not rule out co-infections with other pathogens, and should not be used as the sole basis for treatment or other patient management decisions. Negative results must be combined with clinical observations, patient history, and epidemiological information. The expected result is Negative. Fact Sheet for Patients: SugarRoll.be Fact Sheet for Healthcare Providers: https://www.woods-mathews.com/ This test is not yet approved or cleared by the [place] FDA and  has been authorized for detection and/or diagnosis of SARS-CoV-2 by FDA under an Emergency Use Authorization (EUA). This EUA will remain  in effect (meaning this test can be used) for the duration of the COVID-19 declaration under Section 56 4(b)(1) of the Act, 21 U.S.C. section 360bbb-3(b)(1), unless the authorization is terminated or revoked sooner. Performed at [facility], [place] [address] [place], [place] [address]          Radiology Studies: No results found.      Scheduled Meds: .  stroke: mapping our early stages of recovery book   Does not  apply Once  . amiodarone  200 mg Oral Daily  . apixaban  5 mg Oral BID  . aspirin  81 mg Oral Daily  . atorvastatin  80 mg Oral QPM  . ezetimibe  10 mg Oral Daily  . famotidine  20 mg Oral BID  . furosemide  40 mg Oral BID  . insulin aspart  0-15 Units Subcutaneous TID WC  . insulin aspart  0-5 Units Subcutaneous QHS  . insulin glargine  5  Units Subcutaneous Daily  . levETIRAcetam  500 mg Oral TID  . mouth rinse  15 mL Mouth Rinse BID  . metoprolol tartrate  37.5 mg Oral BID  . pantoprazole  40 mg Oral Daily  . potassium chloride  40 mEq Oral Daily  . pregabalin  50 mg Oral TID  . Ensure Max Protein  11 oz Oral BID  . venlafaxine  75 mg Oral TID   Continuous Infusions: . sodium chloride 10 mL/hr at 11/26/2019 0400     LOS: 6 days    Time spent: 30 mins    Wyvonnia Dusky, MD [facility] Pager [phone]  If 7PM-7AM, please contact night-coverage [email or web address] 12/06/2019, 8:32 AM  '

## 2019-11-29 NOTE — Procedures (Signed)
Transesophageal Echocardiogram :  Indication: stroke Requesting/ordering  physician: Marrianne Mood, PA-C  Procedure: 10cc of viscous lidocaine were given orally to provide local anesthesia to the oropharynx. The patient was positioned supine on the left side, bite block provided. The patient was moderately sedated with the doses of versed and fentanyl as detailed below.  Using digital technique an omniplane probe was advanced into the esophagus without incident.   Moderate sedation: 1. Sedation used:  Versed: 70m, Fentanyl: 547m 2. Time administered:  10:01   Time when patient started recovery: 1027 3. I was face to face during this time, which was 26 minutes  See report in EPIC  for complete details: In brief, imaging revealed normal LV function with no RWMAs and no mural, apical thrombus.  .  Estimated ejection fraction was 55%.  Right sided cardiac chambers were normal with no evidence of pulmonary hypertension.  Imaging of the septum showed no ASD or VSD 2D and color flow confirmed no PFO  The LA was well visualized in orthogonal views.  There was no spontaneous contrast and no thrombus in the LA and LA appendage   The descending thoracic aorta had no  mural aortic debris with no evidence of aneurysmal dilation or disection   BrAaron Edelmangbor-Etang 11/24/2019 10:29 AM

## 2019-11-29 NOTE — Progress Notes (Signed)
Progress Note  Patient Name: Larry Terrell Date of Encounter: 11/25/2019  Primary Cardiologist: Ida Rogue, MD   Subjective   Patient seen this morning, denies chest pain or shortness of breath, denies any weakness.  He is hungry and wants some food.  Inpatient Medications    Scheduled Meds: . butamben-tetracaine-benzocaine      . fentaNYL      . lidocaine      . midazolam      . ondansetron      . [MAR Hold]  stroke: mapping our early stages of recovery book   Does not apply Once  . [MAR Hold] amiodarone  200 mg Oral Daily  . [MAR Hold] apixaban  5 mg Oral BID  . [MAR Hold] aspirin  81 mg Oral Daily  . [MAR Hold] atorvastatin  80 mg Oral QPM  . [MAR Hold] ezetimibe  10 mg Oral Daily  . [MAR Hold] famotidine  20 mg Oral BID  . [MAR Hold] furosemide  40 mg Oral BID  . [MAR Hold] insulin aspart  0-15 Units Subcutaneous TID WC  . [MAR Hold] insulin aspart  0-5 Units Subcutaneous QHS  . [MAR Hold] insulin glargine  5 Units Subcutaneous Daily  . [MAR Hold] levETIRAcetam  500 mg Oral TID  . [MAR Hold] mouth rinse  15 mL Mouth Rinse BID  . [MAR Hold] metoprolol tartrate  37.5 mg Oral BID  . [MAR Hold] pantoprazole  40 mg Oral Daily  . [MAR Hold] potassium chloride  40 mEq Oral Daily  . [MAR Hold] pregabalin  50 mg Oral TID  . [MAR Hold] Ensure Max Protein  11 oz Oral BID  . [facility] Hold] venlafaxine  75 mg Oral TID   Continuous Infusions: . sodium chloride 10 mL/hr at 12/02/2019 0400   PRN Meds: [MAR Hold] acetaminophen **OR** [MAR Hold] acetaminophen, [MAR Hold] ondansetron **OR** [MAR Hold] ondansetron (ZOFRAN) IV   Vital Signs    Vitals:   11/28/19 1944 11/16/2019 0500 11/18/2019 0527 11/24/2019 0813  BP: (!) 142/83  (!) 141/82 (!) 153/62  Pulse: 84  67 71  Resp: 19 15 19 17   Temp: 97.7 F (36.5 C)  97.6 F (36.4 C) 97.9 F (36.6 C)  TempSrc: Oral  Oral Oral  SpO2: 94%  96% 93%  Weight:   83.5 kg   Height:        Intake/Output Summary (Last 24 hours) at  12/05/2019 0944 Last data filed at 11/12/2019 0921 Gross per 24 hour  Intake 270 ml  Output 1900 ml  Net -1630 ml   Last 3 Weights 12/08/2019 11/28/2019 11/27/2019  Weight (lbs) 184 lb 184 lb 4.8 oz 189 lb 1.6 oz  Weight (kg) 83.462 kg 83.598 kg 85.775 kg      Telemetry    Atrial fibrillation, heart rate 89- Personally Reviewed  ECG    No new ECG, obtained- Personally Reviewed  Physical Exam   GEN: No acute distress.   Neck: No JVD Cardiac:  Irregular irregular, no murmurs, rubs, or gallops.  Respiratory: Clear to auscultation bilaterally. GI: Soft, nontender, non-distended  MS: No edema; No deformity. Neuro:  Nonfocal  Psych: Normal affect   Labs    High Sensitivity Troponin:   Recent Labs  Lab 12/02/2019 1208 12/09/2019 1405  TROPONINIHS 10 10      Chemistry Recent Labs  Lab 12/02/2019 1208 11/12/2019 1208 11/24/19 0015 11/25/19 0520 11/27/19 0515 11/28/19 0609 11/21/2019 0434  NA 139   < > 140   < >  140 141 139  K 4.5   < > 3.7   < > 3.4* 3.6 3.6  CL 103   < > 101   < > 95* 95* 95*  CO2 25   < > 29   < > 33* 37* 35*  GLUCOSE 180*   < > 259*   < > 145* 176* 172*  BUN 34*   < > 34*   < > 29* 33* 34*  CREATININE 1.72*   < > 1.60*   < > 1.26* 1.34* 1.37*  CALCIUM 8.9   < > 8.6*   < > 8.7* 8.9 8.7*  PROT 6.9  --  6.7  --   --   --   --   ALBUMIN 3.1*  --  2.8*  --   --   --   --   AST 19  --  16  --   --   --   --   ALT 13  --  12  --   --   --   --   ALKPHOS 100  --  97  --   --   --   --   BILITOT 1.0  --  0.7  --   --   --   --   GFRNONAA 42*   < > 46*   < > >60 57* 55*  GFRAA 49*   < > 53*   < > >60 >60 >60  ANIONGAP 11   < > 10   < > 12 9 9    < > = values in this interval not displayed.     Hematology Recent Labs  Lab 11/27/19 0515 11/28/19 0609 11/28/2019 0434  WBC 8.1 7.8 7.6  RBC 3.27* 3.40* 3.19*  HGB 9.7* 10.0* 9.6*  HCT 30.8* 31.5* 29.1*  MCV 94.2 92.6 91.2  MCH 29.7 29.4 30.1  MCHC 31.5 31.7 33.0  RDW 18.8* 18.9* 18.9*  PLT 197 209 177     BNP Recent Labs  Lab 11/28/2019 1208  BNP 273.0*     DDimer No results for input(s): DDIMER in the last 168 hours.   Radiology    No results found.  Cardiac Studies   Echo 11/27/2019 1. Left ventricular ejection fraction, by estimation, is 55 to 60%. The  left ventricle has normal function. The left ventricle has no regional  wall motion abnormalities. There is mild left ventricular hypertrophy.  Left ventricular diastolic parameters  are indeterminate.  2. Right ventricular systolic function is normal. The right ventricular  size is normal. There is normal pulmonary artery systolic pressure.  3. Left atrial size was moderately dilated.  4. Mild mitral valve regurgitation.  5. Images suggestive of left pleural effusion  Patient Profile     59 y.o. male with history of CAD/CABG, persistent A. fib on Eliquis, heart failure preserved ejection fraction presented with a fall, found to have an acute stroke on MRI.  Being seen for acute on chronic heart failure preserved ejection fraction.  Assessment & Plan    1. HFpEF -Net -1.3 L over the past 24 hours, creatinine stable -Continue Lasix 40 mg twice daily  2.  Persistent atrial fibrillation -Heart rate controlled -Continue beta-blocker, amiodarone, Eliquis.  3.  Altered mental status, acute ischemic stroke -On aspirin, statin -Plan for TEE today to evaluate thrombus -If thrombus is noted, may consider switch to Coumadin.  Total encounter time 35 minutes  Greater than 50% was spent in counseling and coordination of  care with the patient    Signed, Kate Sable, MD  11/22/2019, 9:44 AM

## 2019-11-30 ENCOUNTER — Inpatient Hospital Stay: Payer: Medicare HMO

## 2019-11-30 DIAGNOSIS — I4819 Other persistent atrial fibrillation: Secondary | ICD-10-CM

## 2019-11-30 DIAGNOSIS — I5043 Acute on chronic combined systolic (congestive) and diastolic (congestive) heart failure: Secondary | ICD-10-CM

## 2019-11-30 LAB — COMPREHENSIVE METABOLIC PANEL
ALT: 287 U/L — ABNORMAL HIGH (ref 0–44)
AST: 504 U/L — ABNORMAL HIGH (ref 15–41)
Albumin: 2.6 g/dL — ABNORMAL LOW (ref 3.5–5.0)
Alkaline Phosphatase: 761 U/L — ABNORMAL HIGH (ref 38–126)
Anion gap: 14 (ref 5–15)
BUN: 33 mg/dL — ABNORMAL HIGH (ref 8–23)
CO2: 32 mmol/L (ref 22–32)
Calcium: 9 mg/dL (ref 8.9–10.3)
Chloride: 96 mmol/L — ABNORMAL LOW (ref 98–111)
Creatinine, Ser: 1.15 mg/dL (ref 0.61–1.24)
GFR calc Af Amer: >60 mL/min (ref >60)
GFR calc non Af Amer: >60 mL/min (ref >60)
Glucose, Bld: 198 mg/dL — ABNORMAL HIGH (ref 70–99)
Potassium: 4.3 mmol/L (ref 3.5–5.1)
Sodium: 142 mmol/L (ref 135–145)
Total Bilirubin: 12.5 mg/dL — ABNORMAL HIGH (ref 0.3–1.2)
Total Protein: 7 g/dL (ref 6.5–8.1)

## 2019-11-30 LAB — LACTIC ACID, PLASMA
Lactic Acid, Venous: 1.1 mmol/L (ref 0.5–1.9)
Lactic Acid, Venous: 1.2 mmol/L (ref 0.5–1.9)

## 2019-11-30 LAB — BASIC METABOLIC PANEL
Anion gap: 13 (ref 5–15)
BUN: 31 mg/dL — ABNORMAL HIGH (ref 8–23)
CO2: 34 mmol/L — ABNORMAL HIGH (ref 22–32)
Calcium: 8.8 mg/dL — ABNORMAL LOW (ref 8.9–10.3)
Chloride: 94 mmol/L — ABNORMAL LOW (ref 98–111)
Creatinine, Ser: 0.96 mg/dL (ref 0.61–1.24)
GFR calc Af Amer: >60 mL/min (ref >60)
GFR calc non Af Amer: >60 mL/min (ref >60)
Glucose, Bld: 225 mg/dL — ABNORMAL HIGH (ref 70–99)
Potassium: 4.2 mmol/L (ref 3.5–5.1)
Sodium: 141 mmol/L (ref 135–145)

## 2019-11-30 LAB — GLUCOSE, CAPILLARY
Glucose-Capillary: 211 mg/dL — ABNORMAL HIGH (ref 70–99)
Glucose-Capillary: 224 mg/dL — ABNORMAL HIGH (ref 70–99)
Glucose-Capillary: 189 mg/dL — ABNORMAL HIGH (ref 70–99)
Glucose-Capillary: 191 mg/dL — ABNORMAL HIGH (ref 70–99)

## 2019-11-30 LAB — URINALYSIS, COMPLETE (UACMP) WITH MICROSCOPIC
Bacteria, UA: NONE SEEN
Bilirubin Urine: NEGATIVE
Glucose, UA: 50 mg/dL — AB
Ketones, ur: NEGATIVE mg/dL
Leukocytes,Ua: NEGATIVE
Nitrite: NEGATIVE
Protein, ur: 100 mg/dL — AB
Specific Gravity, Urine: 1.018 (ref 1.005–1.030)
Squamous Epithelial / HPF: NONE SEEN (ref 0–5)
pH: 5 (ref 5.0–8.0)

## 2019-11-30 LAB — CBC
HCT: 32.7 % — ABNORMAL LOW (ref 39.0–52.0)
Hemoglobin: 10.7 g/dL — ABNORMAL LOW (ref 13.0–17.0)
MCH: 29.6 pg (ref 26.0–34.0)
MCHC: 32.7 g/dL (ref 30.0–36.0)
MCV: 90.3 fL (ref 80.0–100.0)
Platelets: 216 10*3/uL (ref 150–400)
RBC: 3.62 MIL/uL — ABNORMAL LOW (ref 4.22–5.81)
RDW: 18.9 % — ABNORMAL HIGH (ref 11.5–15.5)
WBC: 17.8 10*3/uL — ABNORMAL HIGH (ref 4.0–10.5)
nRBC: 0 % (ref 0.0–0.2)

## 2019-11-30 LAB — PROTIME-INR
INR: 2.2 — ABNORMAL HIGH (ref 0.8–1.2)
Prothrombin Time: 23.3 seconds — ABNORMAL HIGH (ref 11.4–15.2)

## 2019-11-30 LAB — HEPARIN LEVEL (UNFRACTIONATED): Heparin Unfractionated: 1.72 IU/mL — ABNORMAL HIGH (ref 0.30–0.70)

## 2019-11-30 LAB — MAGNESIUM: Magnesium: 1.9 mg/dL (ref 1.7–2.4)

## 2019-11-30 LAB — APTT: aPTT: 52 seconds — ABNORMAL HIGH (ref 24–36)

## 2019-11-30 LAB — MRSA PCR SCREENING: MRSA by PCR: NEGATIVE

## 2019-11-30 MED ORDER — VANCOMYCIN HCL IN DEXTROSE 1-5 GM/200ML-% IV SOLN
1000.0000 mg | Freq: Two times a day (BID) | INTRAVENOUS | Status: DC
  Administered 2019-11-30: 1000 mg via INTRAVENOUS
  Filled 2019-11-30 (×3): qty 200

## 2019-11-30 MED ORDER — HYDRALAZINE HCL 20 MG/ML IJ SOLN
10.0000 mg | Freq: Four times a day (QID) | INTRAMUSCULAR | Status: DC | PRN
  Administered 2019-11-30 (×2): 10 mg via INTRAVENOUS
  Filled 2019-11-30 (×2): qty 1

## 2019-11-30 MED ORDER — VANCOMYCIN HCL 1750 MG/350ML IV SOLN
1750.0000 mg | Freq: Once | INTRAVENOUS | Status: AC
  Administered 2019-11-30: 1750 mg via INTRAVENOUS
  Filled 2019-11-30 (×2): qty 350

## 2019-11-30 MED ORDER — HEPARIN (PORCINE) 25000 UT/250ML-% IV SOLN
1000.0000 [IU]/h | INTRAVENOUS | Status: DC
  Administered 2019-11-30: 1000 [IU]/h via INTRAVENOUS
  Filled 2019-11-30: qty 250

## 2019-11-30 MED ORDER — AMIODARONE HCL IN DEXTROSE 360-4.14 MG/200ML-% IV SOLN
30.0000 mg/h | INTRAVENOUS | Status: DC
  Administered 2019-12-01 – 2019-12-02 (×5): 30 mg/h via INTRAVENOUS
  Filled 2019-11-30 (×4): qty 200

## 2019-11-30 MED ORDER — FUROSEMIDE 10 MG/ML IJ SOLN
40.0000 mg | Freq: Two times a day (BID) | INTRAMUSCULAR | Status: DC
  Administered 2019-11-30 – 2019-12-01 (×3): 40 mg via INTRAVENOUS
  Filled 2019-11-30 (×3): qty 4

## 2019-11-30 MED ORDER — METOPROLOL TARTRATE 5 MG/5ML IV SOLN
7.5000 mg | Freq: Four times a day (QID) | INTRAVENOUS | Status: DC
  Administered 2019-12-01 (×2): 7.5 mg via INTRAVENOUS
  Filled 2019-11-30 (×2): qty 10

## 2019-11-30 MED ORDER — PIPERACILLIN-TAZOBACTAM 3.375 G IVPB
3.3750 g | Freq: Three times a day (TID) | INTRAVENOUS | Status: DC
  Administered 2019-12-01 – 2019-12-03 (×8): 3.375 g via INTRAVENOUS
  Filled 2019-11-30 (×12): qty 50

## 2019-11-30 MED ORDER — PROMETHAZINE HCL 25 MG/ML IJ SOLN
6.2500 mg | Freq: Once | INTRAMUSCULAR | Status: AC
  Administered 2019-11-30: 6.25 mg via INTRAVENOUS
  Filled 2019-11-30: qty 1

## 2019-11-30 MED ORDER — AMIODARONE HCL IN DEXTROSE 360-4.14 MG/200ML-% IV SOLN
60.0000 mg/h | INTRAVENOUS | Status: AC
  Administered 2019-11-30: 60 mg/h via INTRAVENOUS
  Filled 2019-11-30: qty 200

## 2019-11-30 MED ORDER — SODIUM CHLORIDE 0.9 % IV SOLN
2.0000 g | Freq: Three times a day (TID) | INTRAVENOUS | Status: DC
  Administered 2019-11-30 (×2): 2 g via INTRAVENOUS
  Filled 2019-11-30 (×4): qty 2

## 2019-11-30 MED ORDER — PANTOPRAZOLE SODIUM 40 MG IV SOLR
40.0000 mg | Freq: Every day | INTRAVENOUS | Status: DC
  Administered 2019-12-01 – 2019-12-02 (×3): 40 mg via INTRAVENOUS
  Filled 2019-11-30 (×3): qty 40

## 2019-11-30 MED ORDER — IOHEXOL 300 MG/ML  SOLN
100.0000 mL | Freq: Once | INTRAMUSCULAR | Status: AC | PRN
  Administered 2019-11-30: 100 mL via INTRAVENOUS

## 2019-11-30 MED ORDER — LEVETIRACETAM IN NACL 500 MG/100ML IV SOLN
500.0000 mg | Freq: Three times a day (TID) | INTRAVENOUS | Status: DC
  Administered 2019-12-01 – 2019-12-03 (×8): 500 mg via INTRAVENOUS
  Filled 2019-11-30 (×13): qty 100

## 2019-11-30 MED ORDER — SODIUM CHLORIDE 0.9 % IV SOLN: 750.0000 mg | Freq: Two times a day (BID) | INTRAVENOUS | Status: DC

## 2019-11-30 NOTE — Progress Notes (Signed)
Pharmacy Antibiotic Note  Larry Terrell is a 59 y.o. male admitted on 11/13/2019 with pneumonia.  Pharmacy has been consulted for vanc/cefepime dosing.  Plan: Patient received vanc 1.75g IV load and cefepime 1g IV x 1  Will continue vanc 1g IV q12h and spoke to [place] NP to increase cefepime dose to 2g IV q8h given severity of pneumonia and superior kinetics.  Ke 0.0549 T1/2 12 hrs  Renal function is currently stable, will continue to monitor and adjust doses per changes in renal function and will continue to monitor patient's clinical status.  Height: 5' 7"  (170.2 cm) Weight: 83.5 kg (184 lb) IBW/kg (Calculated) : 66.1  Temp (24hrs), Avg:98 F (36.7 C), Min:97.6 F (36.4 C), Max:98.4 F (36.9 C)  Recent Labs  Lab 11/11/2019 1208 11/13/2019 2054 11/24/19 0015 11/25/19 0520 11/26/19 0557 11/27/19 0515 11/28/19 0609 11/28/2019 0434 12/02/2019 2111 11/15/2019 2327  WBC   < >  --  7.9   < > 9.6 8.1 7.8 7.6 15.8*  --   CREATININE   < >  --  1.60*   < > 1.23 1.26* 1.34* 1.37* 1.21  --   LATICACIDVEN  --  1.7 1.8  --   --   --   --   --   --  1.1   < > = values in this interval not displayed.    Estimated Creatinine Clearance: 66.3 mL/min (by C-G formula based on SCr of 1.21 mg/dL).    Allergies  Allergen Reactions  . Gabapentin   . Ace Inhibitors Cough    Other reaction(s): Other (See Comments) cough    Thank you for allowing pharmacy to be a part of this patient's care.  [name], PharmD, BCPS Clinical Pharmacist 11/30/2019 3:04 AM

## 2019-11-30 NOTE — Progress Notes (Signed)
Inpatient Diabetes Program Recommendations  AACE/ADA: New Consensus Statement on Inpatient Glycemic Control   Target Ranges:  Prepandial:   less than 140 mg/dL      Peak postprandial:   less than 180 mg/dL (1-2 hours)      Critically ill patients:  140 - 180 mg/dL   Results for CARLEN, FILS (MRN [id number]) as of 11/30/2019 10:53  Ref. Range 11/15/2019 08:28 12/11/2019 09:57 12/05/2019 12:11 11/15/2019 16:45 11/14/2019 20:38 11/30/2019 07:45  Glucose-Capillary Latest Ref Range: 70 - 99 mg/dL 157 (H) 174 (H) 217 (H) 210 (H) 196 (H) 211 (H)   Review of Glycemic Control  Diabetes history: DM2 Outpatient Diabetes medications: Lantus 5 units QHS, Novolog 10 units TID with meals plus additional Novolog by sliding scale based on glucose Current orders for Inpatient glycemic control: Lantus 5 units daily, Novolog 0-15 TID with meals, Novolog 0-5 units QHS  Inpatient Diabetes Program Recommendations:   Insulin - Basal: In reviewing chart, noted Lantus was NOT given on 11/26/2019. Fasting glucose 211 mg/dl today and Lantus 5 units has already been given today. Would not recommend any changes with Lantus at this time.  Thanks, Barnie Alderman, RN, MSN, CDE Diabetes Coordinator Inpatient Diabetes Program [phone] (Team Pager from 8am to 5pm)

## 2019-11-30 NOTE — Progress Notes (Signed)
11/30/19  Discussed patient with Ms. Sharion Settler, NP.  Patient was admitted on 11/13/2019 with a fall and weakness, found to have shortness of breath as well, and on workup, was diagnosed with an acute left PCA territory stroke.  He's currently on a Heparin gtt.  Today, patient had an U/S as well as CT scan of abdomen and pelvis given worsening pain, jaundice, tachycardia, and elevated WBC and elevated LFTs.  CT shows a very distended gallbladder with diffuse surrounding stranding, consistent with acute cholecystitis.  CBD is normal in size on U/S.  Recommended to obtain an [facility] to rule out choledocholithiasis.  Otherwise, will discuss with radiology in the morning for percutaneous cholecystostomy tube placement.  I do not think cholecystectomy at this point would be recommended, given his recent stroke, worsening tachycardia, and the significant inflammation around his gallbladder.  Full consult note to follow.  Olean Ree, MD

## 2019-11-30 NOTE — Progress Notes (Addendum)
Went with patient to U/S due to HR and O2 concern.   Patient O2 sat started dropping to 87-89 on 10L.  HR remained in 140-150s.   Patient A&O x2.   Patient has aphasia - unable to understand all commands (same as earlier).    Once back on unit 2nd IV obtained (1855) -  Amio started.  Patient appears slightly more jaundice than before.  A little more lethargic- having a harder time with attention.    Charge nurse aware.  Resp. Called on back on floor- O2 turned up to 15L HFNC  Made Night time MD aware - will come assess patient

## 2019-11-30 NOTE — Progress Notes (Signed)
Cross cover brief note Nurse called to reported increased oxygen requirements and start of amio for tachycardia started in ultrasound.  Patient reports he has been ablle to do some coughing and deep breathing with sputum production/expectoration today.Marland Kitchen Respirations not labored and oxygen saturations 92% on 15 HFNC. Jaundice notable.   He is having some right posterior back pain but unable to rate or describe. Also endorsing nausea at his time.  Currently due for lasix. Advised to nurse to treat his nausea and give the lasix  Ultrasound results with acute cholecystitis noted.  Cefepime changed to zosyn CT ordered  - results discussed with Dr Hampton Abbot - MRCP ordered stat per his request. Patietn currently in MRI having MRCP done at 0650  Secondary from apparent nursing transfer delay

## 2019-11-30 NOTE — Progress Notes (Signed)
Rachael Fee NP  is looking in patient's chart to see if there's anything else to be done for him. Will wait for any new order.

## 2019-11-30 NOTE — Progress Notes (Addendum)
Patient feeling better after zofran.  MD ok to try to give oral medications at this time.    Will try to give slowly with small sips of ginger ale.    MD to place order for clear liquids   2 pm- patient tolerated pills well- no further issues with vomiting.  Will continue to monitor

## 2019-11-30 NOTE — TOC Progression Note (Signed)
Transition of Care [facility]) - Progression Note    Patient Details  Name: Larry Terrell MRN: [id number] Date of Birth: 12/22/1960  Transition of Care [facility]) CM/SW [place], RN Phone Number: 11/30/2019, 9:41 AM  Clinical Narrative:     [employer] received insurance authorization thru [place] for SNF.  Patient had rapid response related to breathing and is now on CPAP with pneumonia currently being continued medical treatment and will not discharge.    Expected Discharge Plan: [place] Barriers to Discharge: Continued Medical Work up  Expected Discharge Plan and Services Expected Discharge Plan: [place]   Discharge Planning Services: CM Consult   Living arrangements for the past 2 months: Single Family Home                                       Social Determinants of Health (SDOH) Interventions    Readmission Risk Interventions No flowsheet data found.

## 2019-11-30 NOTE — Progress Notes (Signed)
ANTICOAGULATION CONSULT NOTE - Initial Consult  Pharmacy Consult for heparin Indication: atrial fibrillation  Allergies  Allergen Reactions  . Gabapentin   . Ace Inhibitors Cough    Other reaction(s): Other (See Comments) cough    Patient Measurements: Height: 5' 7"  (170.2 cm) Weight: 82.5 kg (181 lb 14.4 oz) IBW/kg (Calculated) : 66.1 Heparin Dosing Weight: 73 kg  Vital Signs: Temp: 97.9 F (36.6 C) (05/20 1151) Temp Source: Oral (05/20 1151) BP: 159/106 (05/20 2000) Pulse Rate: 122 (05/20 2000)  Labs: Recent Labs    12/02/2019 0434 11/22/2019 0434 11/14/2019 2111 11/30/19 0241 11/30/19 2105  HGB 9.6*   < > 11.1* 10.7*  --   HCT 29.1*  --  33.5* 32.7*  --   PLT 177  --  237 216  --   CREATININE 1.37*   < > 1.21 0.96 1.15  TROPONINIHS  --   --  12  --   --    < > = values in this interval not displayed.    Estimated Creatinine Clearance: 69.4 mL/min (by C-G formula based on SCr of 1.15 mg/dL).   Medical History: Past Medical History:  Diagnosis Date  . CHF (congestive heart failure) (HCC)    diastolic (echo at [facility] 2297) EF 55-60%  . 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 COPD (chronic obstructive pulmonary disease) ([place])   . Depression   . Diabetes mellitus with neuropathy ([place])   . Heart attack ([place])   . Neuropathy   . OSA (obstructive sleep apnea)   . Peripheral vascular disease due to secondary diabetes mellitus ([place])   . Splenomegaly   . 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 TIA (transient ischemic attack) April 2016    Medications:  Scheduled:  .  stroke: mapping our early stages of recovery book   Does not apply Once  . amiodarone  200 mg Oral Daily  . aspirin  81 mg Oral Daily  . atorvastatin  80 mg Oral QPM  . ezetimibe  10 mg Oral Daily  . furosemide  40 mg Intravenous Q12H  . insulin aspart  0-15 Units Subcutaneous TID WC  . insulin aspart  0-5 Units Subcutaneous QHS  . insulin glargine  5 Units Subcutaneous Daily  . mouth rinse  15 mL  Mouth Rinse BID  . [START ON 12/01/2019] metoprolol tartrate  7.5 mg Intravenous Q6H  . pantoprazole (PROTONIX) IV  40 mg Intravenous QHS  . potassium chloride  40 mEq Oral Daily  . pregabalin  50 mg Oral TID  . Ensure Max Protein  11 oz Oral BID  . venlafaxine  75 mg Oral TID    Assessment: Patient admitted for fall weakness, w/ h/o CAD s/p PCT, CABG x 5, Cirrhosis s/t NASH, afib on eliquis PTA. Baseline CBC low but stable for patient, aPTT/INR elevated, anti-Xa pending. Patient is being transitioned from eliquis (last dose 05/20 1300) to IV heparin for anticoagulation for afib w/ CHADS-VASc = 3.  Goal of Therapy:  APTT 66 - 102 seconds Heparin level 0.3-0.7 units/ml Monitor platelets by anticoagulation protocol: Yes   Plan:  Will omit bolus and start heparin rate at 1000 units/hr roughly 12 hours post last eliquis dose. Will dose off of aPTT for now until both aPTT/anti-Xa correlate. Current aPTT 52 (possible d/t coagulopathy s/t NASH cirrhosis), will recheck aPTT at 0600. Will continue to monitor CBC's and adjust per aPTT's for now.  [name], PharmD, BCPS Clinical Pharmacist 11/30/2019,11:00 PM

## 2019-11-30 NOTE — Care Management Important Message (Signed)
Important Message  Patient Details  Name: Larry Terrell MRN: [id number] Date of Birth: 1960-07-19   Medicare Important Message Given:  Yes     Dannette Barbara 11/30/2019, 12:54 PM

## 2019-11-30 NOTE — Significant Event (Signed)
Dayshift rapid response RN came to do reassessment on patient since night shift rapid response called last night. Unable to touch base with patient's RN but checked with 2A charge nurse about patient's status prior to arrival. Per charge RN patient's RN reported patient more alert today. Upon this RN's arrival in the room patient was alert with family at bedside. Vital signs just taken by monitor and showed BP 145/92, MAP 109, HR 118 afib/flutter, RR 16 unlabored, and O2 sats 93 % on 8 L HFNC. Patient able to verbalized his name and birthday but responses a little delayed. He reported no pain or shortness of breath. Patient to remain on 2A for now.

## 2019-11-30 NOTE — Progress Notes (Signed)
PT Cancellation Note  Patient Details Name: Larry Terrell MRN: [id number] DOB: 1960-08-17   Cancelled Treatment:    Reason Eval/Treat Not Completed: Other (comment).  Chart reviewed.  Pt noted with rapid response last night (d/t AMS and decreased O2 sats).  Per therapist report from pt rounding, MD Jimmye Norman recommending continuing with PT/OT to patient's tolerance (no restrictions).  Discussed pt with pt's nurse prior to entering pt's room who reports pt with projectile vomiting today (pt now on 9L O2 via HFNC; fluctuating elevated HR noted).  Pt resting in bed with 2 visitors present upon PT arrival.  Pt declining physical therapy session d/t not feeling up to it.  Therapist asked pt if there was anything specific going on and pt eventually answered that he had 10/10 back pain: nurse notified of pt's report of 10/10 pain.  D/t pt's impaired activity tolerance and current medical status, will change/update PT frequency to minimum of 2x/week at this time (will update frequency as deemed appropriate).   Leitha Bleak, PT 11/30/19, 12:40 PM

## 2019-11-30 NOTE — Progress Notes (Addendum)
PROGRESS NOTE    Larry Terrell  XBL:[id number] DOB: 1960/10/14 DOA: 12/05/2019 PCP: Medicine, [place]:   Principal Problem:   Acute on chronic heart failure with preserved ejection fraction (HFpEF) (HCC) Active Problems:   Diabetes mellitus with complication (HCC)   CAD in native artery   Essential hypertension   PAD (peripheral artery disease) (HCC)   Encephalopathy   Persistent atrial fibrillation (HCC)   Stroke (cerebrum) (HCC)   Acute on chronic hypoxic respiratory failure: secondary to chronic systolic and diastolic CHF.  Echo during hospitalization for CABG last month showed EF 45%. Not on ARB due to renal dysfunction.Continue on supplemental oxygen. Per records from last hospital stay and interim office notes, he was discharged after CABG at [facility] on 3L.  Continue on lasix, plan for d/c on 71m BID. Strict I/Os, daily weights. 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 On admission, patient noted to be confused, no focal deficits noted, but concern for vision changes. Not tPA candidate given on eliquis. MRI obtained due to sudden mental status change shows new left PCA infarct involving the splenium of the corpus callosum, posterior aspect of the left fornix, left occipital lobe, left medial temporal lobe, and body of the left hippocampus. Echo showed no cardiogenic source of embolism. Continue atorvastatin, zetia, aspirin & eliquis. Hx of a. fib. TEE 11/22/2019 did not show a thrombus as per cardio   Possible pneumonia: continue on cefepime. Procal slightly elevated.  Encourage incentive spirometry. Continue on supplemental oxygen and wean as tolerated.    Nausea/vomiting: zofran prn. XR abd shows stomach distention otherwise nonobstructive gas pattern   Leukocytosis: reactive vs infection. Will continue on IV abxs.   Acute metabolic encephalopathy: superimposed on baseline cognitive impairment.  PT/OT recs  SNF.  GERD: continue on famotidine, pantoprazole  CKD stage IIIb: Cr continues to trend down. Will continue to monitor   DM2: continue lantus, SSI w/ accuchecks.   COPD: w/o exacerbation. Continue w/ supportive care  Depression: severity unknown. Continue venlafaxine, pregabalin   PAF: s/p CABG. Continue amiodarone and eliquis as per cardio  History of seizures: continue keppra   DVT prophylaxis: eliquis Code Status: full  Family Communication: discussed pt's care w/ pt's sister, KMaudie Mercury and answered her questions  Disposition Plan: will likely d/c to SNF. CM is working on this   Status is: Inpatient  Remains inpatient appropriate because:IV treatments appropriate due to intensity of illness or inability to take PO   Dispo: The patient is from: Home              Anticipated d/c is to: SNF              Anticipated d/c date is: 3 days              Patient currently is not medically stable to d/c.      Consultants:   Cardio    Procedures: TEE 11/14/2019 no thrombus noted   Antimicrobials: cefepime    Subjective: Pt c/o nausea.   Objective: Vitals:   11/30/19 0400 11/30/19 0500 11/30/19 0526 11/30/19 0741  BP: (!) 190/88 (!) 173/90  (!) 158/96  Pulse: (!) 113 (!) 116 (!) 105 (!) 101  Resp: 20 17 19 19   Temp:    98.4 F (36.9 C)  TempSrc:    Axillary  SpO2: 97% (!) 83% 96% 96%  Weight:      Height:  Intake/Output Summary (Last 24 hours) at 11/30/2019 0800 Last data filed at 11/30/2019 0500 Gross per 24 hour  Intake 450 ml  Output 1225 ml  Net -775 ml   Filed Weights   11/28/19 0404 12/08/2019 0527 11/30/19 0300  Weight: 83.6 kg 83.5 kg 82.5 kg    Examination:  General exam: Appears calm but uncomfortable  Respiratory system: diminished breath sounds b/l.  Cardiovascular system: S1 & S2 +. No rubs, gallops or clicks.. Gastrointestinal system: Abdomen is nondistended, soft and nontender. Hyperactive bowel sounds heard. Central nervous system:  Moves all 4 extremities   Psychiatry: Judgement and insight appear normal. Flat mood and affect      Data Reviewed: I have personally reviewed following labs and imaging studies  CBC: Recent Labs  Lab 11/14/2019 1208 11/24/19 0015 11/27/19 0515 11/28/19 0609 12/09/2019 0434 11/28/2019 2111 11/30/19 0241  WBC 8.8   < > 8.1 7.8 7.6 15.8* 17.8*  NEUTROABS 7.3  --   --   --   --   --   --   HGB 9.9*   < > 9.7* 10.0* 9.6* 11.1* 10.7*  HCT 32.0*   < > 30.8* 31.5* 29.1* 33.5* 32.7*  MCV 97.0   < > 94.2 92.6 91.2 90.1 90.3  PLT 215   < > 197 209 177 237 216   < > = values in this interval not displayed.   Basic Metabolic Panel: Recent Labs  Lab 11/27/19 0515 11/28/19 0609 12/02/2019 0434 12/06/2019 2111 11/30/19 0241  NA 140 141 139 139 141  K 3.4* 3.6 3.6 4.1 4.2  CL 95* 95* 95* 95* 94*  CO2 33* 37* 35* 33* 34*  GLUCOSE 145* 176* 172* 216* 225*  BUN 29* 33* 34* 31* 31*  CREATININE 1.26* 1.34* 1.37* 1.21 0.96  CALCIUM 8.7* 8.9 8.7* 9.0 8.8*   GFR: Estimated Creatinine Clearance: 83.1 mL/min (by C-G formula based on SCr of 0.96 mg/dL). Liver Function Tests: Recent Labs  Lab 11/17/2019 1208 11/24/19 0015  AST 19 16  ALT 13 12  ALKPHOS 100 97  BILITOT 1.0 0.7  PROT 6.9 6.7  ALBUMIN 3.1* 2.8*   No results for input(s): LIPASE, AMYLASE in the last 168 hours. Recent Labs  Lab 11/26/2019 1951  AMMONIA <9*   Coagulation Profile: Recent Labs  Lab 11/24/2019 1208  INR 1.9*   Cardiac Enzymes: No results for input(s): CKTOTAL, CKMB, CKMBINDEX, TROPONINI in the last 168 hours. BNP (last 3 results) No results for input(s): PROBNP in the last 8760 hours. HbA1C: No results for input(s): HGBA1C in the last 72 hours. CBG: Recent Labs  Lab 12/07/2019 0957 11/27/2019 1211 12/02/2019 1645 11/11/2019 2038 11/30/19 0745  GLUCAP 174* 217* 210* 196* 211*   Lipid Profile: No results for input(s): CHOL, HDL, LDLCALC, TRIG, CHOLHDL, LDLDIRECT in the last 72 hours. Thyroid Function  Tests: No results for input(s): TSH, T4TOTAL, FREET4, T3FREE, THYROIDAB in the last 72 hours. Anemia Panel: No results for input(s): VITAMINB12, FOLATE, FERRITIN, TIBC, IRON, RETICCTPCT in the last 72 hours. Sepsis Labs: Recent Labs  Lab 12/02/2019 1305 11/24/2019 2054 11/24/19 0015 12/08/2019 2111 11/20/2019 2327 11/30/19 0241  PROCALCITON <0.10  --   --  0.12  --   --   LATICACIDVEN  --  1.7 1.8  --  1.1 1.2    Recent Results (from the past 240 hour(s))  SARS Coronavirus 2 by RT PCR (hospital order, performed in [facility] hospital lab) Nasopharyngeal Nasopharyngeal Swab     Status: None  Collection Time: 11/21/2019  2:05 PM   Specimen: Nasopharyngeal Swab  Result Value Ref Range Status   SARS Coronavirus 2 NEGATIVE NEGATIVE Final    Comment: (NOTE) SARS-CoV-2 target nucleic acids are NOT DETECTED. The SARS-CoV-2 RNA is generally detectable in upper and lower respiratory specimens during the acute phase of infection. The lowest concentration of SARS-CoV-2 viral copies this assay can detect is 250 copies / mL. A negative result does not preclude SARS-CoV-2 infection and should not be used as the sole basis for treatment or other patient management decisions.  A negative result may occur with improper specimen collection / handling, submission of specimen other than nasopharyngeal swab, presence of viral mutation(s) within the areas targeted by this assay, and inadequate number of viral copies (<250 copies / mL). A negative result must be combined with clinical observations, patient history, and epidemiological information. Fact Sheet for Patients:   StrictlyIdeas.no Fact Sheet for Healthcare Providers: BankingDealers.co.za This test is not yet approved or cleared  by the [place] FDA and has been authorized for detection and/or diagnosis of SARS-CoV-2 by FDA under an Emergency Use Authorization (EUA).  This EUA will remain in  effect (meaning this test can be used) for the duration of the COVID-19 declaration under Section 564(b)(1) of the Act, 21 U.S.C. section 360bbb-3(b)(1), unless the authorization is terminated or revoked sooner. Performed at [facility], [address] [place], [place] [address]   Culture, blood (x 2)     Status: None   Collection Time: 11/25/2019  8:54 PM   Specimen: BLOOD  Result Value Ref Range Status   Specimen Description BLOOD RAC  Final   Special Requests BOTTLES DRAWN AEROBIC AND ANAEROBIC BCAV  Final   Culture   Final    NO GROWTH 5 DAYS Performed at [facility], [address], [place], [place] [address]    Report Status 11/28/2019 FINAL  Final  Culture, blood (x 2)     Status: None   Collection Time: 11/27/2019  8:54 PM   Specimen: BLOOD  Result Value Ref Range Status   Specimen Description BLOOD BRH  Final   Special Requests BOTTLES DRAWN AEROBIC AND ANAEROBIC BCAV  Final   Culture   Final    NO GROWTH 5 DAYS Performed at [facility], [address], [place], [place] [address]    Report Status 11/28/2019 FINAL  Final  SARS CORONAVIRUS 2 (TAT 6-24 HRS) Nasopharyngeal Nasopharyngeal Swab     Status: None   Collection Time: 11/28/19 11:39 AM   Specimen: Nasopharyngeal Swab  Result Value Ref Range Status   SARS Coronavirus 2 NEGATIVE NEGATIVE Final    Comment: (NOTE) SARS-CoV-2 target nucleic acids are NOT DETECTED. The SARS-CoV-2 RNA is generally detectable in upper and lower respiratory specimens during the acute phase of infection. Negative results do not preclude SARS-CoV-2 infection, do not rule out co-infections with other pathogens, and should not be used as the sole basis for treatment or other patient management decisions. Negative results must be combined with clinical observations, patient history, and epidemiological information. The expected result is Negative. Fact Sheet for  Patients: SugarRoll.be Fact Sheet for Healthcare Providers: https://www.woods-mathews.com/ This test is not yet approved or cleared by the [place] FDA and  has been authorized for detection and/or diagnosis of SARS-CoV-2 by FDA under an Emergency Use Authorization (EUA). This EUA will remain  in effect (meaning this test can be used) for the duration of the COVID-19 declaration under Section 56  4(b)(1) of the Act, 21 U.S.C. section 360bbb-3(b)(1), unless the authorization is terminated or revoked sooner. Performed at [facility], [place] [address], [place], [place] [address]   CULTURE, BLOOD (ROUTINE X 2) w Reflex to ID Panel     Status: None (Preliminary result)   Collection Time: 11/27/2019 11:28 PM   Specimen: BLOOD RIGHT HAND  Result Value Ref Range Status   Specimen Description BLOOD RIGHT HAND  Final   Special Requests   Final    BOTTLES DRAWN AEROBIC AND ANAEROBIC Blood Culture adequate volume   Culture   Final    NO GROWTH < 12 HOURS Performed at [facility], [address], [place], [place] [address]    Report Status PENDING  Incomplete  CULTURE, BLOOD (ROUTINE X 2) w Reflex to ID Panel     Status: None (Preliminary result)   Collection Time: 11/14/2019 11:38 PM   Specimen: BLOOD LEFT HAND  Result Value Ref Range Status   Specimen Description BLOOD LEFT HAND  Final   Special Requests   Final    BOTTLES DRAWN AEROBIC AND ANAEROBIC Blood Culture adequate volume   Culture   Final    NO GROWTH < 12 HOURS Performed at [facility], [address] [place], [place] [address]    Report Status PENDING  Incomplete         Radiology Studies: DG Abd 1 View  Result Date: 12/07/2019 CLINICAL DATA:  Abdominal pain and distention. EXAM: ABDOMEN - 1 VIEW COMPARISON:  None. FINDINGS: The stomach is distended. Bowel gas pattern is otherwise nonobstructive. There is a moderate amount of stool in the colon and  rectum. A left common iliac stent is noted. There is no definite pneumatosis or free air. IMPRESSION: Distended stomach, otherwise the bowel gas pattern is nonobstructive. Electronically Signed   By: Constance Holster M.D.   On: 12/05/2019 21:11   DG Chest Port 1 View  Result Date: 12/10/2019 CLINICAL DATA:  Acute respiratory distress. EXAM: PORTABLE CHEST 1 VIEW COMPARISON:  Nov 25, 2019 FINDINGS: There are worsening airspace opacities bilaterally. There is cardiomegaly. The patient is status post prior median sternotomy. There are small bilateral pleural effusions. There is no pneumothorax. The lung volumes are low. IMPRESSION: Cardiomegaly with worsening bilateral airspace opacities which may represent worsening congestive heart failure or multifocal pneumonia. Electronically Signed   By: Constance Holster M.D.   On: 11/26/2019 21:13   ECHO TEE  Result Date: 11/13/2019    TRANSESOPHOGEAL ECHO REPORT   Patient Name:   [facility] LEE Furgeson Date of Exam: 12/11/2019 Medical Rec #:  [id number]       Height:       67.0 in Accession #:    [id number]      Weight:       184.0 lb Date of Birth:  1960-07-20        BSA:          1.952 m Patient Age:    24 years        BP:           163/74 mmHg Patient Gender: M               HR:           85 bpm. Exam Location:  [facility] Procedure: Transesophageal Echo, Cardiac Doppler and Color Doppler Indications:     I63.9 Stroke  History:         Patient has prior history of Echocardiogram  examinations, most                  recent 11/24/2019. CHF, TIA, PVD and COPD; Risk Factors:Sleep                  Apnea and Diabetes.  Sonographer:     Charmayne Sheer RDCS (AE) Referring Phys:  [id number] Arvil Chaco Diagnosing Phys: Kate Sable MD PROCEDURE: The transesophogeal probe was passed without difficulty through the esophogus of the patient. Sedation performed by performing physician. The patient developed no complications during the procedure. IMPRESSIONS  1. Left ventricular ejection  fraction, by estimation, is 55 to 60%. The left ventricle has normal function. The left ventricle has no regional wall motion abnormalities.  2. Right ventricular systolic function is normal. The right ventricular size is normal.  3. Left atrial size was moderately dilated. No left atrial/left atrial appendage thrombus was detected.  4. Right atrial size was mildly dilated.  5. The mitral valve is normal in structure. Mild mitral valve regurgitation.  6. Aortic valve lambl's excrescence noted. The aortic valve is tricuspid. Aortic valve regurgitation is trivial. Conclusion(s)/Recommendation(s): No LA/LAA thrombus identified. No intracardiac source of embolism detected on this on this transesophageal echocardiogram. FINDINGS  Left Ventricle: Left ventricular ejection fraction, by estimation, is 55 to 60%. The left ventricle has normal function. The left ventricle has no regional wall motion abnormalities. The left ventricular internal cavity size was normal in size. There is  no left ventricular hypertrophy. Right Ventricle: The right ventricular size is normal. No increase in right ventricular wall thickness. Right ventricular systolic function is normal. Left Atrium: Left atrial size was moderately dilated. No left atrial/left atrial appendage thrombus was detected. Right Atrium: Right atrial size was mildly dilated. Pericardium: There is no evidence of pericardial effusion. Mitral Valve: The mitral valve is normal in structure. Mild mitral valve regurgitation. Tricuspid Valve: The tricuspid valve is not well visualized. Tricuspid valve regurgitation is mild. Aortic Valve: Aortic valve lambl's excrescence noted. The aortic valve is tricuspid. Aortic valve regurgitation is trivial. Pulmonic Valve: The pulmonic valve was normal in structure. Pulmonic valve regurgitation is trivial. Aorta: The aortic root is normal in size and structure. Venous: The inferior vena cava was not well visualized. IAS/Shunts: No atrial  level shunt detected by color flow Doppler. Kate Sable MD Electronically signed by Kate Sable MD Signature Date/Time: 12/05/2019/11:59:04 AM    Final         Scheduled Meds: .  stroke: mapping our early stages of recovery book   Does not apply Once  . amiodarone  200 mg Oral Daily  . apixaban  5 mg Oral BID  . aspirin  81 mg Oral Daily  . atorvastatin  80 mg Oral QPM  . ezetimibe  10 mg Oral Daily  . famotidine  20 mg Oral BID  . furosemide  40 mg Oral BID  . insulin aspart  0-15 Units Subcutaneous TID WC  . insulin aspart  0-5 Units Subcutaneous QHS  . insulin glargine  5 Units Subcutaneous Daily  . levETIRAcetam  500 mg Oral TID  . mouth rinse  15 mL Mouth Rinse BID  . metoprolol tartrate  37.5 mg Oral BID  . pantoprazole  40 mg Oral Daily  . potassium chloride  40 mEq Oral Daily  . pregabalin  50 mg Oral TID  . Ensure Max Protein  11 oz Oral BID  . venlafaxine  75 mg Oral TID   Continuous Infusions: .  ceFEPime (MAXIPIME) IV    . vancomycin       LOS: 7 days    Time spent: 30 mins    Wyvonnia Dusky, MD [facility] Pager [phone]  If 7PM-7AM, please contact night-coverage [email or web address] 11/30/2019, 8:00 AM  '

## 2019-11-30 NOTE — Progress Notes (Addendum)
Patient had some projectile vomiting directly after receiving 2 oral medications (potassium and Oxy).   MD in room at time.   Will hold the rest of his medications for now and make NPO. Patient was sitting upright and CPAP was off when he vomited.   Zofran and morphine given for nausea and pain.  Will reassess later if I am able to give oral medications.   Pt is on continue pulse ox monitoring.  Concern for resp. with too much pain medication.  Will monitor closely with morphine

## 2019-11-30 NOTE — Progress Notes (Addendum)
   11/26/2019 2100  Assess: MEWS Score  BP (!) 143/83  Pulse Rate 95  ECG Heart Rate 91  Resp (!) 21  SpO2 92 %  O2 Device Nasal Cannula  O2 Flow Rate (L/min) 5 L/min  Assess: MEWS Score  MEWS Temp 0  MEWS Systolic 0  MEWS Pulse 0  MEWS RR 1  MEWS LOC 1  MEWS Score 2  MEWS Score Color Yellow  Assess: if the MEWS score is Yellow or Red  Were vital signs taken at a resting state? Yes  Focused Assessment Documented focused assessment  Early Detection of Sepsis Score *See Row Information* Medium  MEWS guidelines implemented *See Row Information* Yes  Treat  MEWS Interventions Escalated (See documentation below);Consulted Respiratory Therapy  Take Vital Signs  Increase Vital Sign Frequency  Yellow: Q 2hr X 2 then Q 4hr X 2, if remains yellow, continue Q 4hrs  Escalate  MEWS: Escalate Yellow: discuss with charge nurse/RN and consider discussing with provider and RRT  Notify: Charge Nurse/RN  Name of Charge Nurse/RN Notified Kerry Hough RN  Date Charge Nurse/RN Notified 11/16/2019  Time Charge Nurse/RN Notified 2038  Notify: Provider  Provider Name/Title Cheree Ditto NP  Date Provider Notified 11/11/2019  Notification Type Page (Paged by Kerry Hough RN)  Notification Reason Change in status  Response See new orders  Date of Provider Response 11/30/2019  Time of Provider Response 2039  Notify: Rapid Response  Name of Rapid Response RN Notified Hiral D. Patel RN  Date Rapid Response Notified 12/08/2019  Time Rapid Response Notified 1000  Document  Patient Outcome Stabilized after interventions  Progress note created (see row info) Yes

## 2019-11-30 NOTE — Progress Notes (Addendum)
Patient's heart rate jumped up into the 150s- paged Cardiology.   Patient having some nausea and pain at the time.   Gave zofran and morphine for 10 out of 10 back pain.     Patient has been complaining of pain in right upper quad.  Notified MD-  Will order an abdominal U/S

## 2019-11-30 NOTE — Progress Notes (Signed)
Progress Note  Patient Name: Larry Terrell Date of Encounter: 11/30/2019  Primary Cardiologist: Rockey Situ  Subjective   Status post TEE on 12/07/2019 without evidence of thrombus.  Overnight patient developed respiratory distress with hypoxia with chest x-ray concerning for pneumonia.  Overnight, he required CPAP and has subsequently been transitioned to supplemental oxygen via nasal cannula.  He has been started on antibiotic therapy.  He remains in A. fib with RVR with ventricular rates in the low 100s bpm.  BP stable.  He had an episode of projectile emesis earlier this morning,.  This occurred after drinking a large quantity of water upon removing CPAP.    Inpatient Medications    Scheduled Meds: .  stroke: mapping our early stages of recovery book   Does not apply Once  . amiodarone  200 mg Oral Daily  . apixaban  5 mg Oral BID  . aspirin  81 mg Oral Daily  . atorvastatin  80 mg Oral QPM  . ezetimibe  10 mg Oral Daily  . famotidine  20 mg Oral BID  . furosemide  40 mg Intravenous Q12H  . insulin aspart  0-15 Units Subcutaneous TID WC  . insulin aspart  0-5 Units Subcutaneous QHS  . insulin glargine  5 Units Subcutaneous Daily  . levETIRAcetam  500 mg Oral TID  . mouth rinse  15 mL Mouth Rinse BID  . metoprolol tartrate  37.5 mg Oral BID  . pantoprazole  40 mg Oral Daily  . potassium chloride  40 mEq Oral Daily  . pregabalin  50 mg Oral TID  . Ensure Max Protein  11 oz Oral BID  . venlafaxine  75 mg Oral TID   Continuous Infusions: . ceFEPime (MAXIPIME) IV 2 g (11/30/19 0920)   PRN Meds: acetaminophen **OR** acetaminophen, hydrALAZINE, morphine injection, naloxone, ondansetron **OR** ondansetron (ZOFRAN) IV, oxyCODONE   Vital Signs    Vitals:   11/30/19 1000 11/30/19 1100 11/30/19 1151 11/30/19 1200  BP: (!) 178/92 (!) 178/106  128/86  Pulse: (!) 110 (!) 107  (!) 118  Resp: 15 18  18   Temp:   97.9 F (36.6 C)   TempSrc:   Oral   SpO2: 96% 94%  93%  Weight:       Height:        Intake/Output Summary (Last 24 hours) at 11/30/2019 1438 Last data filed at 11/30/2019 0500 Gross per 24 hour  Intake 450 ml  Output 925 ml  Net -475 ml   Filed Weights   11/28/19 0404 12/05/2019 0527 11/30/19 0300  Weight: 83.6 kg 83.5 kg 82.5 kg    Telemetry    A. fib with RVR, low 100s bpm- Personally Reviewed  ECG    No new tracings- Personally Reviewed  Physical Exam   GEN: No acute distress.   Neck: No JVD. Cardiac:  Tachycardic, irregularly irregular, no murmurs, rubs, or gallops.  Respiratory:  Diminished and coarse breath sounds bilaterally.  GI: Soft, nontender, non-distended, decreased bowel sounds. MS: No edema; No deformity. Neuro:  Alert and oriented x 3; Nonfocal.  Psych: Normal affect.  Labs    Chemistry Recent Labs  Lab 11/24/19 0015 11/25/19 0520 11/20/2019 0434 11/21/2019 2111 11/30/19 0241  NA 140   < > 139 139 141  K 3.7   < > 3.6 4.1 4.2  CL 101   < > 95* 95* 94*  CO2 29   < > 35* 33* 34*  GLUCOSE 259*   < >  172* 216* 225*  BUN 34*   < > 34* 31* 31*  CREATININE 1.60*   < > 1.37* 1.21 0.96  CALCIUM 8.6*   < > 8.7* 9.0 8.8*  PROT 6.7  --   --   --   --   ALBUMIN 2.8*  --   --   --   --   AST 16  --   --   --   --   ALT 12  --   --   --   --   ALKPHOS 97  --   --   --   --   BILITOT 0.7  --   --   --   --   GFRNONAA 46*   < > 55* >60 >60  GFRAA 53*   < > >60 >60 >60  ANIONGAP 10   < > 9 11 13    < > = values in this interval not displayed.     Hematology Recent Labs  Lab 11/17/2019 0434 11/13/2019 2111 11/30/19 0241  WBC 7.6 15.8* 17.8*  RBC 3.19* 3.72* 3.62*  HGB 9.6* 11.1* 10.7*  HCT 29.1* 33.5* 32.7*  MCV 91.2 90.1 90.3  MCH 30.1 29.8 29.6  MCHC 33.0 33.1 32.7  RDW 18.9* 19.0* 18.9*  PLT 177 237 216    Cardiac EnzymesNo results for input(s): TROPONINI in the last 168 hours. No results for input(s): TROPIPOC in the last 168 hours.   BNPNo results for input(s): BNP, PROBNP in the last 168 hours.   DDimer No  results for input(s): DDIMER in the last 168 hours.   Radiology    DG Abd 1 View  Result Date: 12/05/2019 IMPRESSION: Distended stomach, otherwise the bowel gas pattern is nonobstructive. Electronically Signed   By: Constance Holster M.D.   On: 11/19/2019 21:11   DG Chest Port 1 View  Result Date: 12/01/2019 IMPRESSION: Cardiomegaly with worsening bilateral airspace opacities which may represent worsening congestive heart failure or multifocal pneumonia. Electronically Signed   By: Constance Holster M.D.   On: 11/20/2019 21:13    Cardiac Studies   2D echo 11/24/2019: 1. Left ventricular ejection fraction, by estimation, is 55 to 60%. The  left ventricle has normal function. The left ventricle has no regional  wall motion abnormalities. There is mild left ventricular hypertrophy.  Left ventricular diastolic parameters  are indeterminate.  2. Right ventricular systolic function is normal. The right ventricular  size is normal. There is normal pulmonary artery systolic pressure.  3. Left atrial size was moderately dilated.  4. Mild mitral valve regurgitation.  5. Images suggestive of left pleural effusion __________  TEE 11/16/2019: 1. Left ventricular ejection fraction, by estimation, is 55 to 60%. The  left ventricle has normal function. The left ventricle has no regional  wall motion abnormalities.  2. Right ventricular systolic function is normal. The right ventricular  size is normal.  3. Left atrial size was moderately dilated. No left atrial/left atrial  appendage thrombus was detected.  4. Right atrial size was mildly dilated.  5. The mitral valve is normal in structure. Mild mitral valve  regurgitation.  6. Aortic valve lambl's excrescence noted. The aortic valve is tricuspid.  Aortic valve regurgitation is trivial.  Patient Profile     59 y.o. male with history of CAD/CABG, persistent A. fib on Eliquis, heart failure preserved ejection fraction presented  with a fall, found to have an acute stroke on MRI, who we are seeing for acute on chronic  heart failure preserved ejection fraction and A. fib with RVR.  Assessment & Plan    1.  Acute on chronic HFpEF: -Volume status is improving -Remains on IV Lasix 40 mg twice daily -Likely exacerbated by persistent A. fib with RVR -Daily weights -Strict I's and O's -CHF education  2.  Persistent A. fib with RVR: -Ventricular rates remain suboptimal likely contributing to his degree of volume overload -Trial of oral amiodarone and metoprolol for rate control, in the setting of prior projectile emesis.  If he does not tolerate this we will need heparin drip and IV amiodarone for rate control -Trial of Eliquis, if he does not tolerate this we will need heparin drip as above -Ideally, he is not a great cardioversion candidate at this time given his acute illness and recent CVA, this will be discussed with MD -We will need adequate ventricular rate control prior to discharge with plans for possible outpatient DCCV after he has improved from his acute illness -However, if ventricular rates proved to be difficult to control we will need to consider DCCV prior to discharge  3.  CAD status post CABG: -No symptoms concerning for angina -Continue current medications including aspirin,  4.  Acute on chronic hypoxic respiratory distress: -Secondary to #1, though cannot exclude some degree of aspiration following TEE -Antibiotics per IM -Wean oxygen as able  5.  CVA: -TEE without evidence of cardiac thrombus -Remains on aspirin, Eliquis, Lipitor, and Zetia  For questions or updates, please contact [place] Please consult [email or web address] for contact info under Cardiology/STEMI.    Signed, Christell Faith, PA-C [place] Pager: [phone] 11/30/2019, 2:38 PM

## 2019-11-30 NOTE — Progress Notes (Signed)
The patient is not compliant with his CPAP, keep on popping it off and desating.  Patient not able to comprehend the rational for keeping the CPAP on.. I have reoriented patient multiple times. Will continue to monitor.

## 2019-11-30 NOTE — Progress Notes (Signed)
Cross coverage brief note Overnight a rapid response was called by RN with concerns for mental status change and decreased oxygen saturations. Noted was pneumonia on xray and leukocytosis.  ABG noting chronic respiratory failure.  Patient now requiring CPAP or HFNC and poor reserve. He was started on cefepime and vanc for HCAP as well as incentive spirometry and flutter valve therapy

## 2019-12-01 ENCOUNTER — Other Ambulatory Visit: Payer: Self-pay | Admitting: Cardiothoracic Surgery

## 2019-12-01 ENCOUNTER — Inpatient Hospital Stay: Payer: Medicare HMO

## 2019-12-01 DIAGNOSIS — E118 Type 2 diabetes mellitus with unspecified complications: Secondary | ICD-10-CM

## 2019-12-01 DIAGNOSIS — I25119 Atherosclerotic heart disease of native coronary artery with unspecified angina pectoris: Secondary | ICD-10-CM

## 2019-12-01 DIAGNOSIS — K810 Acute cholecystitis: Secondary | ICD-10-CM

## 2019-12-01 LAB — COMPREHENSIVE METABOLIC PANEL
ALT: 236 U/L — ABNORMAL HIGH (ref 0–44)
AST: 311 U/L — ABNORMAL HIGH (ref 15–41)
Albumin: 2.5 g/dL — ABNORMAL LOW (ref 3.5–5.0)
Alkaline Phosphatase: 740 U/L — ABNORMAL HIGH (ref 38–126)
Anion gap: 14 (ref 5–15)
BUN: 39 mg/dL — ABNORMAL HIGH (ref 8–23)
CO2: 32 mmol/L (ref 22–32)
Calcium: 8.8 mg/dL — ABNORMAL LOW (ref 8.9–10.3)
Chloride: 96 mmol/L — ABNORMAL LOW (ref 98–111)
Creatinine, Ser: 1.49 mg/dL — ABNORMAL HIGH (ref 0.61–1.24)
GFR calc Af Amer: 58 mL/min — ABNORMAL LOW (ref >60)
GFR calc non Af Amer: 50 mL/min — ABNORMAL LOW (ref >60)
Glucose, Bld: 233 mg/dL — ABNORMAL HIGH (ref 70–99)
Potassium: 4.1 mmol/L (ref 3.5–5.1)
Sodium: 142 mmol/L (ref 135–145)
Total Bilirubin: 9.6 mg/dL — ABNORMAL HIGH (ref 0.3–1.2)
Total Protein: 6.9 g/dL (ref 6.5–8.1)

## 2019-12-01 LAB — CBC
HCT: 34.1 % — ABNORMAL LOW (ref 39.0–52.0)
Hemoglobin: 10.4 g/dL — ABNORMAL LOW (ref 13.0–17.0)
MCH: 28.9 pg (ref 26.0–34.0)
MCHC: 30.5 g/dL (ref 30.0–36.0)
MCV: 94.7 fL (ref 80.0–100.0)
Platelets: 232 10*3/uL (ref 150–400)
RBC: 3.6 MIL/uL — ABNORMAL LOW (ref 4.22–5.81)
RDW: 19.1 % — ABNORMAL HIGH (ref 11.5–15.5)
WBC: 25.9 10*3/uL — ABNORMAL HIGH (ref 4.0–10.5)
nRBC: 0 % (ref 0.0–0.2)

## 2019-12-01 LAB — TYPE AND SCREEN
ABO/RH(D): B POS
Antibody Screen: NEGATIVE

## 2019-12-01 LAB — GLUCOSE, CAPILLARY
Glucose-Capillary: 190 mg/dL — ABNORMAL HIGH (ref 70–99)
Glucose-Capillary: 228 mg/dL — ABNORMAL HIGH (ref 70–99)
Glucose-Capillary: 208 mg/dL — ABNORMAL HIGH (ref 70–99)
Glucose-Capillary: 184 mg/dL — ABNORMAL HIGH (ref 70–99)
Glucose-Capillary: 161 mg/dL — ABNORMAL HIGH (ref 70–99)

## 2019-12-01 LAB — APTT
aPTT: >160 seconds (ref 24–36)
aPTT: >160 seconds (ref 24–36)
aPTT: 116 seconds — ABNORMAL HIGH (ref 24–36)

## 2019-12-01 MED ORDER — HEPARIN (PORCINE) 25000 UT/250ML-% IV SOLN
700.0000 [IU]/h | INTRAVENOUS | Status: AC
  Administered 2019-12-01: 800 [IU]/h via INTRAVENOUS

## 2019-12-01 MED ORDER — HEPARIN (PORCINE) 25000 UT/250ML-% IV SOLN: 800.0000 [IU]/h | INTRAVENOUS | Status: DC

## 2019-12-01 MED ORDER — SODIUM CHLORIDE 0.9% IV SOLUTION
Freq: Once | INTRAVENOUS | Status: AC
  Administered 2019-12-01: 10 mL/h via INTRAVENOUS

## 2019-12-01 MED ORDER — CHLORHEXIDINE GLUCONATE CLOTH 2 % EX PADS
6.0000 | MEDICATED_PAD | Freq: Every day | CUTANEOUS | Status: DC
  Administered 2019-12-01 – 2019-12-02 (×2): 6 via TOPICAL

## 2019-12-01 MED ORDER — GADOBUTROL 1 MMOL/ML IV SOLN
8.0000 mL | Freq: Once | INTRAVENOUS | Status: AC | PRN
  Administered 2019-12-01: 8 mL via INTRAVENOUS

## 2019-12-01 MED ORDER — METOPROLOL TARTRATE 5 MG/5ML IV SOLN: 2.5000 mg | INTRAVENOUS | Status: DC | PRN

## 2019-12-01 NOTE — Progress Notes (Signed)
Inpatient Diabetes Program Recommendations  AACE/ADA: New Consensus Statement on Inpatient Glycemic Control (2015)  Target Ranges:  Prepandial:   less than 140 mg/dL      Peak postprandial:   less than 180 mg/dL (1-2 hours)      Critically ill patients:  140 - 180 mg/dL   Lab Results  Component Value Date   GLUCAP 208 (H) 12/01/2019   HGBA1C 5.2 11/25/2019    Review of Glycemic Control Results for Larry Terrell, Larry Terrell (MRN [id number]) as of 12/01/2019 14:55  Ref. Range 11/30/2019 16:02 11/30/2019 21:44 12/01/2019 00:53 12/01/2019 08:28 12/01/2019 11:41  Glucose-Capillary Latest Ref Range: 70 - 99 mg/dL 189 (H) 191 (H) 228 (H) 190 (H) 208 (H)  Diabetes history: DM2 Outpatient Diabetes medications: Lantus 5 units QHS, Novolog 10 units TID with meals plus additional Novolog by sliding scale based on glucose Current orders for Inpatient glycemic control: Lantus 5 units daily, Novolog 0-15 TID with meals, Novolog 0-5 units QHS  Inpatient Diabetes Program Recommendations:  Consider increasing Lantus to 10 units daily.    Thanks  Adah Perl, RN, BC-ADM Inpatient Diabetes Coordinator Pager [phone] (8a-5p)

## 2019-12-01 NOTE — Progress Notes (Signed)
Progress Note  Patient Name: Larry Terrell Date of Encounter: 12/01/2019  Primary Cardiologist: Rockey Situ  Subjective   Abdominal ultrasound 5/20 showed distended gallbladder sludge without definitive evidence of acute cholecystitis. CT A/P concerning for acute cholecystitis. MRCP did not show definitive choledocholithiasis. Surgery is planning for percutaneous cholecystomy tube today. Transferred to the ICU overnight with Afib with RVR, increased back pain and hypoxia. He remains on 15 L via nasal cannula. Has been transferred to heparin gtt and IV amiodarone for concerns of possible decreased absorption  with noted distended bowel on imaging. Ventricular rates are reasonably controlled on the above.   Documented UOP 12.2 L for the admission. Weight 82.5-->82.2 kg. Bump in renal function this morning 33/1.15-->39/1.49. WBC 17.8-->25.9.   Inpatient Medications    Scheduled Meds: .  stroke: mapping our early stages of recovery book   Does not apply Once  . aspirin  81 mg Oral Daily  . atorvastatin  80 mg Oral QPM  . Chlorhexidine Gluconate Cloth  6 each Topical Daily  . ezetimibe  10 mg Oral Daily  . furosemide  40 mg Intravenous Q12H  . insulin aspart  0-15 Units Subcutaneous TID WC  . insulin aspart  0-5 Units Subcutaneous QHS  . insulin glargine  5 Units Subcutaneous Daily  . mouth rinse  15 mL Mouth Rinse BID  . pantoprazole (PROTONIX) IV  40 mg Intravenous QHS  . potassium chloride  40 mEq Oral Daily  . pregabalin  50 mg Oral TID  . Ensure Max Protein  11 oz Oral BID  . venlafaxine  75 mg Oral TID   Continuous Infusions: . amiodarone 30 mg/hr (12/01/19 0238)  . heparin 1,000 Units/hr (12/01/19 0128)  . levETIRAcetam 500 mg (12/01/19 0551)  . piperacillin-tazobactam 3.375 g (12/01/19 0907)   PRN Meds: acetaminophen **OR** acetaminophen, hydrALAZINE, metoprolol tartrate, morphine injection, naloxone, ondansetron **OR** ondansetron (ZOFRAN) IV, oxyCODONE   Vital Signs    Vitals:   12/01/19 0000 12/01/19 0100 12/01/19 0800 12/01/19 0900  BP: (!) 150/99 (!) 148/74 (!) 141/84 (!) 154/87  Pulse: (!) 116 (!) 103    Resp: 12 (!) 21 18 13   Temp:  98.8 F (37.1 C) 97.6 F (36.4 C)   TempSrc:  Oral Axillary   SpO2: 96% 96%    Weight:  82.2 kg    Height:  5' 7"  (1.702 m)      Intake/Output Summary (Last 24 hours) at 12/01/2019 0954 Last data filed at 12/01/2019 0128 Gross per 24 hour  Intake 287.29 ml  Output --  Net 287.29 ml   Filed Weights   11/19/2019 0527 11/30/19 0300 12/01/19 0100  Weight: 83.5 kg 82.5 kg 82.2 kg    Telemetry    Afib with ventricular rates in the 80s to 90s bpm - Personally Reviewed  ECG    No new tracings - Personally Reviewed  Physical Exam   GEN: No acute distress. Ill appearing.  Neck: No JVD. Cardiac: Irregularly irregular, no murmurs, rubs, or gallops.  Respiratory: Diminished and coarse breath sounds bilaterally. On supplemental oxygen via nasal cannula at 15 L. GI: Soft, nontender, non-distended.   MS: No edema; No deformity. Neuro:  Somnolent.  Psych: Somnolent.  Labs    Chemistry Recent Labs  Lab 11/30/19 0241 11/30/19 2105 12/01/19 0603  NA 141 142 142  K 4.2 4.3 4.1  CL 94* 96* 96*  CO2 34* 32 32  GLUCOSE 225* 198* 233*  BUN 31* 33* 39*  CREATININE 0.96  1.15 1.49*  CALCIUM 8.8* 9.0 8.8*  PROT  --  7.0 6.9  ALBUMIN  --  2.6* 2.5*  AST  --  504* 311*  ALT  --  287* 236*  ALKPHOS  --  761* 740*  BILITOT  --  12.5* 9.6*  GFRNONAA >60 >60 50*  GFRAA >60 >60 58*  ANIONGAP 13 14 14      Hematology Recent Labs  Lab 12/11/2019 2111 11/30/19 0241 12/01/19 0603  WBC 15.8* 17.8* 25.9*  RBC 3.72* 3.62* 3.60*  HGB 11.1* 10.7* 10.4*  HCT 33.5* 32.7* 34.1*  MCV 90.1 90.3 94.7  MCH 29.8 29.6 28.9  MCHC 33.1 32.7 30.5  RDW 19.0* 18.9* 19.1*  PLT 237 216 232    Cardiac EnzymesNo results for input(s): TROPONINI in the last 168 hours. No results for input(s): TROPIPOC in the last 168 hours.    BNPNo results for input(s): BNP, PROBNP in the last 168 hours.   DDimer No results for input(s): DDIMER in the last 168 hours.   Radiology    DG Abd 1 View  Result Date: 11/12/2019 IMPRESSION: Distended stomach, otherwise the bowel gas pattern is nonobstructive. Electronically Signed   By: Constance Holster M.D.   On: 11/27/2019 21:11   CT ABDOMEN PELVIS W CONTRAST  Result Date: 11/30/2019 IMPRESSION: 1. Acute cholecystitis with distended, inflamed gallbladder and surrounding inflammatory stranding. 2. Intermediate sized pleural effusions and small volume perihepatic ascites. 3. Aortic Atherosclerosis (ICD10-I70.0). Electronically Signed   By: Ulyses Jarred M.D.   On: 11/30/2019 22:39   DG Chest Port 1 View  Result Date: 12/02/2019 IMPRESSION: Cardiomegaly with worsening bilateral airspace opacities which may represent worsening congestive heart failure or multifocal pneumonia. Electronically Signed   By: Constance Holster M.D.   On: 11/18/2019 21:13   MR ABDOMEN MRCP W WO CONTAST  Result Date: 12/01/2019 IMPRESSION: 1. Distended and sludge filled gallbladder with gallbladder wall thickening and pericholecystic fluid. Findings are consistent with acute cholecystitis and in keeping with prior CT. 2. No biliary ductal dilatation. Central common bile duct is poorly visualized, likely due to a combination of breath motion artifact and edema. No obvious obstructing calculus or other lesion. 3. Small volume perihepatic ascites. 4. Moderate bilateral pleural effusions and associated atelectasis or consolidation of the bilateral lung bases. Electronically Signed   By: Eddie Candle M.D.   On: 12/01/2019 08:11    US Abdomen Limited RUQ  Result Date: 11/30/2019 IMPRESSION: 1. Distended gallbladder sludge without definite sonographic evidence for acute cholecystitis. 2. Cirrhotic appearing liver. 3. Small volume abdominal ascites. 4. Incidentally noted right-sided pleural effusion. Electronically  Signed   By: Constance Holster M.D.   On: 11/30/2019 19:02    Cardiac Studies   2D echo 11/24/2019: 1. Left ventricular ejection fraction, by estimation, is 55 to 60%. The  left ventricle has normal function. The left ventricle has no regional  wall motion abnormalities. There is mild left ventricular hypertrophy.  Left ventricular diastolic parameters  are indeterminate.  2. Right ventricular systolic function is normal. The right ventricular  size is normal. There is normal pulmonary artery systolic pressure.  3. Left atrial size was moderately dilated.  4. Mild mitral valve regurgitation.  5. Images suggestive of left pleural effusion __________  TEE 12/08/2019: 1. Left ventricular ejection fraction, by estimation, is 55 to 60%. The  left ventricle has normal function. The left ventricle has no regional  wall motion abnormalities.  2. Right ventricular systolic function is normal. The right ventricular  size is normal.  3. Left atrial size was moderately dilated. No left atrial/left atrial  appendage thrombus was detected.  4. Right atrial size was mildly dilated.  5. The mitral valve is normal in structure. Mild mitral valve  regurgitation.  6. Aortic valve lambl's excrescence noted. The aortic valve is tricuspid.  Aortic valve regurgitation is trivial.  Patient Profile     59 y.o. male with history of CAD/CABG, persistent A. fib on Eliquis, heart failure preserved ejection fraction presented with a fall, found to have an acute stroke on MRI, who we are seeing for acute on chronic heart failure preserved ejection fraction and A. fib with RVR.  Assessment & Plan    1.  Acute on chronic HFpEF: -Volume status is improved -Bump in renal function today -Hold IV Lasix and KCl today, reassess in the morning -Likely exacerbated by persistent A. fib with RVR -Daily weights -Strict I's and O's -CHF education  2.  Persistent A. fib with RVR: -Ventricular rates are  reasonably controlled in the setting of his acute illness and newly noted cholecystitis  -He may not be absorbing PO medications and has been transitioned heparin and amiodarone gtts -IV metoprolol 2.5 mg prn sustained ventricular rates > 110 bpm  -Ideally, would try to avoid DCCV currently in the setting of his multiple acute comorbid conditions, including recent CVA and acute cholecystitis  -We will need adequate ventricular rate control prior to discharge with plans for possible outpatient DCCV after he has improved from his acute illness -However, if ventricular rates proved to be difficult to control we will need to consider DCCV prior to discharge  3.  CAD status post CABG: -No symptoms concerning for angina -Continue current medications including aspirin,  4.  Acute on chronic hypoxic respiratory distress: -Secondary to #1, though cannot exclude some degree of aspiration following TEE -Antibiotics per IM -Wean oxygen as able  5.  CVA: -TEE without evidence of cardiac thrombus -Remains on aspirin, Eliquis, Lipitor, and Zetia  6. Acute cholecystitis: -Last dose of Eliquis afternoon of 5/20, now on heparin gtt as above -Would continue IV amiodarone during procedure, if needed for tachycardic rates, can increase back up to 60 mg/hr or IV metoprolol pushes  7. AKI: -Hold IV Lasix and KCl today -Reassess 5/22   For questions or updates, please contact [place] Please consult [email or web address] for contact info under Cardiology/STEMI.    Signed, Christell Faith, PA-C [place] Pager: [phone] 12/01/2019, 9:54 AM

## 2019-12-01 NOTE — Progress Notes (Addendum)
ANTICOAGULATION CONSULT NOTE - Initial Consult  Pharmacy Consult for heparin Indication: atrial fibrillation  Allergies  Allergen Reactions  . Gabapentin   . Ace Inhibitors Cough    Other reaction(s): Other (See Comments) cough    Patient Measurements: Height: 5' 7"  (170.2 cm) Weight: 82.2 kg (181 lb 3.5 oz) IBW/kg (Calculated) : 66.1 Heparin Dosing Weight: 73 kg  Vital Signs: Temp: 97.7 F (36.5 C) (05/21 1500) Temp Source: Oral (05/21 1500) BP: 155/87 (05/21 1500) Pulse Rate: 109 (05/21 1500)  Labs: Recent Labs    11/13/2019 2111 12/09/2019 2111 11/30/19 0241 11/30/19 2105 11/30/19 2309 12/01/19 0603 12/01/19 0917  HGB 11.1*   < > 10.7*  --   --  10.4*  --   HCT 33.5*  --  32.7*  --   --  34.1*  --   PLT 237  --  216  --   --  232  --   APTT  --   --   --   --  52* >160* >160*  LABPROT  --   --   --   --  23.3*  --   --   INR  --   --   --   --  2.2*  --   --   HEPARINUNFRC  --   --   --   --  1.72*  --   --   CREATININE 1.21   < > 0.96 1.15  --  1.49*  --   TROPONINIHS 12  --   --   --   --   --   --    < > = values in this interval not displayed.    Estimated Creatinine Clearance: 53.4 mL/min (A) (by C-G formula based on SCr of 1.49 mg/dL (H)).   Medical History: Past Medical History:  Diagnosis Date  . CHF (congestive heart failure) (HCC)    diastolic (echo at [facility] 0786) EF 55-60%  . Cirrhosis ([place])    NASH per records  . COPD (chronic obstructive pulmonary disease) ([place])   . Depression   . Diabetes mellitus with neuropathy ([place])   . Heart attack ([place])   . Neuropathy   . OSA (obstructive sleep apnea)   . Peripheral vascular disease due to secondary diabetes mellitus ([place])   . Splenomegaly   . Temporal giant cell arteritis ([place])   . TIA (transient ischemic attack) April 2016    Medications:  Scheduled:  .  stroke: mapping our early stages of recovery book   Does not apply Once  . aspirin  81 mg Oral Daily  . atorvastatin  80 mg Oral QPM  .  Chlorhexidine Gluconate Cloth  6 each Topical Daily  . ezetimibe  10 mg Oral Daily  . insulin aspart  0-15 Units Subcutaneous TID WC  . insulin aspart  0-5 Units Subcutaneous QHS  . insulin glargine  5 Units Subcutaneous Daily  . mouth rinse  15 mL Mouth Rinse BID  . pantoprazole (PROTONIX) IV  40 mg Intravenous QHS  . pregabalin  50 mg Oral TID  . Ensure Max Protein  11 oz Oral BID  . venlafaxine  75 mg Oral TID    Assessment: Patient admitted for fall weakness, w/ h/o CAD s/p PCT, CABG x 5, Cirrhosis s/t NASH, atrial fibrillation on eliquis PTA. Baseline CBC low but stable for patient, aPTT/INR elevated, anti-Xa pending. Patient is being transitioned from eliquis (last dose 05/20 1300) to IV heparin for anticoagulation for atrial fibrillation  w/ CHADS-VASc = 3.  5/21 On 5/21 patient was planned to receive IR procedure for cholecystostomy this AM, but INR was elevated ~2.2.  Patient received FFP x 2 doses.  Heparin was held for possible procedure, but the procedure has now been rescheduled for 5/22 at 0900.    Goal of Therapy:  APTT 66 - 102 seconds Heparin level 0.3-0.7 units/ml Monitor platelets by anticoagulation protocol: Yes   Plan:  05/21 APTT >160 x 2 this AM, supratherapeutic.  Heparin turned off at 1000 and resumed at 1600 after procedure plan determined.  As APTT was supratherapeutic this AM, resume at 800 units/hour.  Will follow APTT for now as last dose of Eliquis was yesterday.  Check APTT in 6 hours.  Per radiology note, will continue heparin until 5/22 at 0630.  Possibly will resume heparin following procedure.  CBC stable for now, will recheck CBC with AM labs.  Delman Kitten, PharmD 12/01/2019,3:16 PM

## 2019-12-01 NOTE — Consult Note (Signed)
[facility] SURGICAL CONSULTATION NOTE (initial) - cpt: [phone]   HISTORY OF PRESENT ILLNESS (HPI):  Patient is very somnolent this morning and could not contribute much to history or ROS. History primarily obtained through chart review.   59 y.o. male with a past medical history significant for depression, OSA, temporal giant cell arteritis, COPD, DM, TIA, CHF, a.fib on Eliquis and NASH cirrhosis, CABG in 10/2019 who initially presented to [facility] on 05/13 after a fall at his SNF. He was hypoxic in the ED and started on [place]. He eventually became altered in the ED and initial work up was unremarkable however on MRI of the brain he was found to have an acute left PCA infarct as well as punctate acute infarct in the left corona radiata. He is currently in ICU and being managed for this. On 05/20, patient had an U/S as well as CT scan of abdomen and pelvis given worsening pain, jaundice, tachycardia, and elevated WBC and elevated LFTs.  CT shows a very distended gallbladder with diffuse surrounding stranding, consistent with acute cholecystitis. He underwent MRCP on 05/21 did not show definitive choledocholithiasis and like CBD compression from gallbladder edema and distension.   Surgery is consulted by hospitalist provider Sharion Settler, NP in this context for evaluation and management of acute cholecystitis and possible choledocholithiasis.   PAST MEDICAL HISTORY (PMH):  Past Medical History:  Diagnosis Date  . CHF (congestive heart failure) (HCC)    diastolic (echo at [facility] 3343) EF 55-60%  . 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 COPD (chronic obstructive pulmonary disease) ([place])   . Depression   . Diabetes mellitus with neuropathy ([place])   . Heart attack ([place])   . Neuropathy   . OSA (obstructive sleep apnea)   . Peripheral vascular disease due to secondary diabetes mellitus ([place])   . Splenomegaly   . Temporal giant cell arteritis ([place])   . TIA (transient ischemic attack) April  2016     PAST SURGICAL HISTORY [facility]):  Past Surgical History:  Procedure Laterality Date  . AORTA - FEMORAL ARTERY BYPASS GRAFT Bilateral   . CARDIAC CATHETERIZATION    . cardiac stents  July 2015   . CORONARY ARTERY BYPASS GRAFT N/A 10/26/2019   Procedure: CORONARY ARTERY BYPASS GRAFTING (CABG) x 5, Using Bilateral mammary arteries, and right leg greater saphenous vein harvested endoscopically;  Surgeon: Wonda Olds, MD;  Location: [place];  Service: Open Heart Surgery;  Laterality: N/A;  . CORONARY STENT INTERVENTION N/A 08/09/2017   Procedure: CORONARY STENT INTERVENTION;  Surgeon: Wellington Hampshire, MD;  Location: [place] CV LAB;  Service: Cardiovascular;  Laterality: N/A;  . LAPAROSCOPIC APPENDECTOMY N/A 07/10/2015   Procedure: APPENDECTOMY LAPAROSCOPIC;  Surgeon: Rolm Bookbinder, MD;  Location: [place];  Service: General;  Laterality: N/A;  . LEFT HEART CATH AND CORONARY ANGIOGRAPHY N/A 08/09/2017   Procedure: LEFT HEART CATH AND CORONARY ANGIOGRAPHY;  Surgeon: Wellington Hampshire, MD;  Location: [place] CV LAB;  Service: Cardiovascular;  Laterality: N/A;  . LEFT HEART CATH AND CORONARY ANGIOGRAPHY N/A 10/19/2019   Procedure: LEFT HEART CATH AND CORONARY ANGIOGRAPHY;  Surgeon: Minna Merritts, MD;  Location: [place] CV LAB;  Service: Cardiovascular;  Laterality: N/A;  . RADIAL ARTERY HARVEST Left 10/26/2019   Procedure: RADIAL ARTERY HARVEST;  Surgeon: Wonda Olds, MD;  Location: [place];  Service: Open Heart Surgery;  Laterality: Left;  . TEE WITHOUT CARDIOVERSION N/A 10/26/2019   Procedure: TRANSESOPHAGEAL ECHOCARDIOGRAM (  TEE);  Surgeon: Wonda Olds, MD;  Location: [facility] OR;  Service: Open Heart Surgery;  Laterality: N/A;  . TEE WITHOUT CARDIOVERSION N/A 11/25/2019   Procedure: TRANSESOPHAGEAL ECHOCARDIOGRAM (TEE);  Surgeon: Kate Sable, MD;  Location: [facility] ORS;  Service: Cardiovascular;  Laterality: N/A;     MEDICATIONS:  Prior to Admission medications    Medication Sig Start Date End Date Taking? Authorizing Provider  acetaminophen (TYLENOL) 500 MG tablet Take 500-1,000 mg by mouth daily as needed for mild pain or fever.    Yes [provider]  amiodarone (PACERONE) 200 MG tablet Take 1 tablet (200 mg total) by mouth daily. 11/14/19  Yes Loel Dubonnet, NP  apixaban (ELIQUIS) 5 MG TABS tablet Take 1 tablet (5 mg total) by mouth 2 (two) times daily. 11/06/19  Yes Barrett, Erin R, PA-C  aspirin 81 MG chewable tablet Chew 1 tablet (81 mg total) by mouth daily. 10/20/19  Yes Nicole Kindred A, DO  atorvastatin (LIPITOR) 80 MG tablet Take 1 tablet (80 mg total) by mouth daily. Patient taking differently: Take 80 mg by mouth every evening.  10/21/19  Yes Minna Merritts, MD  ezetimibe (ZETIA) 10 MG tablet Take 1 tablet (10 mg total) by mouth daily. Patient taking differently: Take 10 mg by mouth daily in the afternoon.  10/20/19  Yes Minna Merritts, MD  famotidine (PEPCID) 20 MG tablet Take 1 tablet (20 mg total) by mouth 2 (two) times daily. 12/26/17  Yes Paulette Blanch, MD  insulin aspart (NOVOLOG) 100 UNIT/ML injection Inject 0-15 Units into the skin 4 (four) times daily - after meals and at bedtime. Patient taking differently: Inject 0-20 Units into the skin 4 (four) times daily - after meals and at bedtime. Inject according to blood sugar readings:  < 60u: 0u and contact MD 120-160: 2u 161-200: 4u 201-250: 8u 251-300: 12u 301-350: 16u 351-450: contact MD 10/20/19  Yes Nicole Kindred A, DO  insulin aspart (NOVOLOG) 100 UNIT/ML injection Inject 10 Units into the skin 3 (three) times daily with meals. 10/20/19  Yes Nicole Kindred A, DO  insulin glargine (LANTUS) 100 UNIT/ML injection Inject 0.05 mLs (5 Units total) into the skin daily. 10/20/19  Yes Nicole Kindred A, DO  isosorbide dinitrate (ISORDIL) 20 MG tablet Take 1 tablet (20 mg total) by mouth 3 (three) times daily. For 1 month, then discontinue Patient taking differently: Take 20 mg  by mouth 3 (three) times daily.  11/06/19  Yes Barrett, Erin R, PA-C  levETIRAcetam (KEPPRA) 500 MG tablet Take 500 mg by mouth 3 (three) times daily. 10/03/19  Yes [provider]  metFORMIN (GLUCOPHAGE) 500 MG tablet Take 500 mg by mouth 2 (two) times daily with a meal.   Yes [provider]  Metoprolol Tartrate 37.5 MG TABS Take 37.5 mg by mouth 2 (two) times daily. 11/06/19  Yes Barrett, Erin R, PA-C  nitroGLYCERIN (NITROSTAT) 0.4 MG SL tablet Place 0.4 mg under the tongue every 5 (five) minutes as needed for chest pain.   Yes [provider]  oxyCODONE (OXY IR/ROXICODONE) 5 MG immediate release tablet Take 1-2 tablets (5-10 mg total) by mouth every 4 (four) hours as needed for severe pain. 11/06/19  Yes Barrett, Erin R, PA-C  pantoprazole (PROTONIX) 40 MG tablet Take 1 tablet (40 mg total) by mouth daily. 10/20/19  Yes Nicole Kindred A, DO  pregabalin (LYRICA) 50 MG capsule Take 1 capsule (50 mg total) by mouth 3 (three) times daily. 10/20/19 10/19/20 Yes Ar[place]us Ped,  Kelly A, DO  therapeutic multivitamin-minerals (THERAGRAN-M) tablet Take 1 tablet by mouth daily.   Yes [provider]  venlafaxine (EFFEXOR) 75 MG tablet Take 75 mg by mouth 3 (three) times daily. 05/11/19  Yes [provider]  furosemide (LASIX) 40 MG tablet Take 1 tablet (40 mg total) by mouth 2 (two) times daily for 5 days. 12/01/2019 11/28/19  Carrie Mew, MD     ALLERGIES:  Allergies  Allergen Reactions  . Gabapentin   . Ace Inhibitors Cough    Other reaction(s): Other (See Comments) cough     SOCIAL HISTORY:  Social History   Socioeconomic History  . Marital status: Divorced    Spouse name: Not on file  . Number of children: Not on file  . Years of education: Not on file  . Highest education level: Not on file  Occupational History  . Not on file  Tobacco Use  . Smoking status: Former Smoker    Types: Cigarettes    Quit date: 2009    Years since quitting: 12.3  .  Smokeless tobacco: Never Used  Substance and Sexual Activity  . Alcohol use: No    Alcohol/week: 0.0 standard drinks  . Drug use: No  . Sexual activity: Not on file  Other Topics Concern  . Not on file  Social History Narrative  . Not on file   Social Determinants of Health   Financial Resource Strain:   . Difficulty of Paying Living Expenses:   Food Insecurity:   . Worried About Charity fundraiser in the Last Year:   . Arboriculturist in the Last Year:   Transportation Needs:   . Film/video editor (Medical):   Marland Kitchen Lack of Transportation (Non-Medical):   Physical Activity:   . Days of Exercise per Week:   . Minutes of Exercise per Session:   Stress:   . Feeling of Stress :   Social Connections:   . Frequency of Communication with Friends and Family:   . Frequency of Social Gatherings with Friends and Family:   . Attends Religious Services:   . Active Member of Clubs or Organizations:   . Attends Archivist Meetings:   Marland Kitchen Marital Status:   Intimate Partner Violence:   . Fear of Current or Ex-Partner:   . Emotionally Abused:   Marland Kitchen Physically Abused:   . Sexually Abused:      FAMILY HISTORY:  Family History  Problem Relation Age of Onset  . Heart attack Mother 41  . Stroke Mother   . Heart attack Father   . Alzheimer's disease Father       REVIEW OF SYSTEMS:  Review of Systems  Unable to perform ROS: Mental status change    VITAL SIGNS:  Temp:  [97.9 F (36.6 C)-98.8 F (37.1 C)] 98.8 F (37.1 C) (05/21 0100) Pulse Rate:  [103-136] 103 (05/21 0100) Resp:  [12-21] 21 (05/21 0100) BP: (128-178)/(74-112) 148/74 (05/21 0100) SpO2:  [89 %-96 %] 96 % (05/21 0100) Weight:  [82.2 kg] 82.2 kg (05/21 0100)     Height: 5' 7"  (170.2 cm) Weight: 82.2 kg BMI (Calculated): 28.38   INTAKE/OUTPUT:  05/20 0701 - 05/21 0700 In: 287.3 [I.V.:186.7; IV Piggyback:100.6] Out: -   PHYSICAL EXAM:  Physical Exam Vitals and nursing note reviewed.  Constitutional:       General: He is not in acute distress.    Appearance: He is well-developed. He is not ill-appearing.     Comments:  Patient is somnolent, arouses to verbal stimuli and very briefly responds but becomes somnolent again  HENT:     Head: Normocephalic and atraumatic.  Eyes:     General: No scleral icterus.    Conjunctiva/sclera: Conjunctivae normal.  Cardiovascular:     Rate and Rhythm: Tachycardia present. Rhythm irregular.     Heart sounds: No murmur.     Comments: In atrial fibrillation at 100 Pulmonary:     Effort: Pulmonary effort is normal. No respiratory distress.  Chest:    Abdominal:     General: Abdomen is flat. A surgical scar is present. There is no distension.     Tenderness: There is abdominal tenderness in the right upper quadrant, epigastric area and periumbilical area. There is no guarding or rebound.     Comments: Soft, previous midline incision present, tenderness to epigastric, RUQ, periumbilical, no peritonitis   Genitourinary:    Comments: Deferred Musculoskeletal:        General: Normal range of motion.     Right lower leg: No edema.     Left lower leg: No edema.  Skin:    General: Skin is warm and dry.     Coloration: Skin is not pale.     Findings: No erythema.  Neurological:     Mental Status: He is alert.     Comments: Somnolent   Psychiatric:     Comments: Unable to reliably assess      Labs:  CBC Latest Ref Rng & Units 12/01/2019 11/30/2019 11/30/2019  WBC 4.0 - 10.5 K/uL 25.9(H) 17.8(H) 15.8(H)  Hemoglobin 13.0 - 17.0 g/dL 10.4(L) 10.7(L) 11.1(L)  Hematocrit 39.0 - 52.0 % 34.1(L) 32.7(L) 33.5(L)  Platelets 150 - 400 K/uL 232 216 237   CMP Latest Ref Rng & Units 12/01/2019 11/30/2019 11/30/2019  Glucose 70 - 99 mg/dL 233(H) 198(H) 225(H)  BUN 8 - 23 mg/dL 39(H) 33(H) 31(H)  Creatinine 0.61 - 1.24 mg/dL 1.49(H) 1.15 0.96  Sodium 135 - 145 mmol/L 142 142 141  Potassium 3.5 - 5.1 mmol/L 4.1 4.3 4.2  Chloride 98 - 111 mmol/L 96(L) 96(L) 94(L)    CO2 22 - 32 mmol/L 32 32 34(H)  Calcium 8.9 - 10.3 mg/dL 8.8(L) 9.0 8.8(L)  Total Protein 6.5 - 8.1 g/dL 6.9 7.0 -  Total Bilirubin 0.3 - 1.2 mg/dL 9.6(H) 12.5(H) -  Alkaline Phos 38 - 126 U/L 740(H) 761(H) -  AST 15 - 41 U/L 311(H) 504(H) -  ALT 0 - 44 U/L 236(H) 287(H) -     Imaging studies:   RUQ [place] (11/30/2019) personally reviewed gallbladder distension, and radiologist report reviewed:  IMPRESSION: 1. Distended gallbladder sludge without definite sonographic evidence for acute cholecystitis. 2. Cirrhotic appearing liver. 3. Small volume abdominal ascites. 4. Incidentally noted right-sided pleural effusion.   CT Abdomen/Pelvis (11/30/2019) personally reviewed with markedly distended gallbladder with surrounding stranding concerning for acute cholecystitis, and radiologist report reviewed:  IMPRESSION: 1. Acute cholecystitis with distended, inflamed gallbladder and surrounding inflammatory stranding. 2. Intermediate sized pleural effusions and small volume perihepatic ascites. 3. Aortic Atherosclerosis (ICD10-I70.0).   MRCP (12/01/2019) personally reviewed again with distended gallbladder which is likely compression CBD as no significant evidence of choledocholithiasis, and radiologist report reviewed:  IMPRESSION: 1. Distended and sludge filled gallbladder with gallbladder wall thickening and pericholecystic fluid. Findings are consistent with acute cholecystitis and in keeping with prior CT. 2. No biliary ductal dilatation. Central common bile duct is poorly visualized, likely due to a combination of breath motion artifact and  edema. No obvious obstructing calculus or other lesion. 3. Small volume perihepatic ascites. 4. Moderate bilateral pleural effusions and associated atelectasis or consolidation of the bilateral lung bases.   Assessment/Plan: (ICD-10's: K81.0) 59 y.o. male with significant worsening of leukocytosis (25K), some improvement in hyperbilirubinemia  (9.6) with acute cholecystitis without evidence of choledocholithiasis, complicated by a multitude of comorbid conditions including recent left PCA stroke, recent CABG, and multiple comorbid conditions.    - Patient is a very poor surgical candidate given his recent stroke and cardiac surgery. We have discussed case with IR who is in agreement with placement of percutaneous cholecystostomy tube today  - Continue IV ABx (Zosyn)  - Monitor leukocytosis; bilirubin levels  - monitor abdominal examination  - Pain control prn  - Further management per primary and consulting services; we will follows   - DVT prophylaxis  All of the above findings and recommendations were discussed with the medical team.   Thank you for the opportunity to participate in this patient's care.   -- Edison Simon, PA-C [facility] 12/01/2019, 7:53 AM [phone] M-F: 7am - 4pm

## 2019-12-01 NOTE — TOC Initial Note (Signed)
Transition of Care [facility]) - Initial/Assessment Note    Patient Details  Name: Larry Terrell MRN: [id number] Date of Birth: 1961/04/08  Transition of [facility]) CM/SW Contact:    Magnus Ivan, LCSW Phone Number: 12/01/2019, 1:59 PM  Clinical Narrative:     Patient transferred to ICU today. Per chart review, patient disoriented x 3. CSW attempted call to patient's brother for Readmission Screening since patient is high risk. No answer, no voicemail option. Spoke to [employer] who was previously assigned to patient. Per Artist Pais, patient was at [facility] for short-term rehab prior to this hospitalization and plan was for him to return there at discharge. CSW called [facility] Representative Tammy who confirmed this plan. Tammy reported she has received insurance authorization through Monday 5/24 for patient to return to [facility] when medically ready. TOC to continue to follow.   Expected Discharge Plan: Skilled Nursing Facility Barriers to Discharge: Continued Medical Work up   Patient Goals and CMS Choice   CMS Medicare.gov Compare Post Acute Care list provided to:: Patient Represenative (must comment)(Jeff Ketcher (Brother)) Choice offered to / list presented to : Patient  Expected Discharge Plan and Services Expected Discharge Plan: [place]   Discharge Planning Services: CM Consult   Living arrangements for the past 2 months: Single Family Home                                      Prior Living Arrangements/Services Living arrangements for the past 2 months: Single Family Home Lives with:: Self Patient language and need for interpreter reviewed:: Yes Do you feel safe going back to the place where you live?: No   would like to return to rehab  Need for Family Participation in Patient Care: Yes (Comment) Care giver support system in place?: Yes (comment) Current home services: DME([facility]) Criminal Activity/Legal Involvement Pertinent to Current  Situation/Hospitalization: No - Comment as needed  Activities of Daily Living Home Assistive Devices/Equipment: Environmental consultant (specify type), Wheelchair ADL Screening (condition at time of admission) Patient's cognitive ability adequate to safely complete daily activities?: Yes Is the patient deaf or have difficulty hearing?: No Does the patient have difficulty seeing, even when wearing glasses/contacts?: No Does the patient have difficulty concentrating, remembering, or making decisions?: Yes Patient able to express need for assistance with ADLs?: Yes Does the patient have difficulty dressing or bathing?: Yes Independently performs ADLs?: No Communication: Independent Dressing (OT): Needs assistance Is this a change from baseline?: Change from baseline, expected to last <3days Grooming: Needs assistance Is this a change from baseline?: Change from baseline, expected to last <3 days Feeding: Independent Bathing: Needs assistance Is this a change from baseline?: Change from baseline, expected to last <3 days Toileting: Needs assistance Is this a change from baseline?: Change from baseline, expected to last <3 days In/Out Bed: Needs assistance Is this a change from baseline?: Change from baseline, expected to last <3 days Walks in Home: Needs assistance Is this a change from baseline?: Change from baseline, expected to last <3 days Does the patient have difficulty walking or climbing stairs?: Yes Weakness of Legs: Both Weakness of Arms/Hands: Both  Permission Sought/Granted                  Emotional Assessment Appearance:: Appears older than stated age   Affect (typically observed): Calm Orientation: : Oriented to Self, Oriented to Place, Oriented to Situation Alcohol /  Substance Use: Not Applicable Psych Involvement: No (comment)  Admission diagnosis:  Hx of CABG [Z95.1] Acute on chronic combined systolic and diastolic CHF (congestive heart failure) (HCC) [I50.43] Acute on  chronic congestive heart failure, unspecified heart failure type [facility]) [I50.9] Patient Active Problem List   Diagnosis Date Noted  . Stroke (cerebrum) ([place])   . Persistent atrial fibrillation ([place])   . Acute on chronic heart failure with preserved ejection fraction (HFpEF) ([place]) 11/26/2019  . Encephalopathy 11/26/2019  . Acute on chronic combined systolic and diastolic CHF (congestive heart failure) ([place]) 11/26/2019  . Open harvest of left radial artery 10/27/2019  . S/P CABG x 5   . CAD in native artery 10/20/2019  . Unstable angina ([place]) 10/20/2019  . (HFpEF) heart failure with preserved ejection fraction ([place]) 10/20/2019  . Essential hypertension 10/20/2019  . Hyperlipidemia LDL goal <70 10/20/2019  . PAD (peripheral artery disease) ([place]) 10/20/2019  . Chest pain 10/18/2019  . Altered mental status 06/04/2019  . Sepsis ([place]) 06/04/2019  . AKI (acute kidney injury) ([place]) 06/04/2019  . GERD (gastroesophageal reflux disease) 06/04/2019  . History of seizure 06/04/2019  . Stroke ([place]) 04/01/2019  . Acute appendicitis 07/10/2015  . S/P laparoscopic appendectomy 07/10/2015  . Appendicitis, acute   . Diabetes mellitus with complication ([place])   . Chronic congestive heart failure with left ventricular diastolic dysfunction ([place])   . Lactic acidosis   . Acute respiratory failure with hypoxia [facility])    PCP:  Medicine, [place]:   [facility] (N), [place] [address] [place]) [place] [address] Phone: [phone] Fax: [phone]     Social Determinants of Health (SDOH) Interventions    Readmission Risk Interventions Readmission Risk Prevention Plan 12/01/2019  Skilled Nursing Facility Complete  Some recent data might be hidden

## 2019-12-01 NOTE — Progress Notes (Signed)
CRITICAL VALUE ALERT  Critical Value: PTT >160  Date & Time Notied: 12/01/19 0955  Provider Notified: Michiel Sites D  Orders Received/Actions taken: Hold Heparin for 1 hour

## 2019-12-01 NOTE — Progress Notes (Signed)
PA from IR to see patient for consent.  ORder to hold Heparin until after procedure

## 2019-12-01 NOTE — Consult Note (Signed)
NAME: Larry Terrell  DOB: [birth year]  MRN: [id number]  Date/Time: 12/01/2019 6:53 PM  REQUESTING PROVIDER-williams  Subjective:  REASON FOR CONSULT: cholecystitis/pneumonia ? Larry Terrell is a 59 y.o. male with a history of CABG 10/2019, COPD, DM , afib , PVD s/p aortofemoral bypass, CKD, Larry Terrell was admitted from SNF on 12/02/2019 after fall  ED vitals were BP 136/70   Pulse 62   Temp (!) 97.4 F (36.3 C) (Oral)   Resp 15   Ht 5' 7"  (1.702 m)   Wt 89.8 kg   SpO2 92%   BMI 31.01 kg/m  cxr showing edema, initial labs were stable. CT head ruled out acute changesHe was given IV lasix. He later became confused and hypoxic and was placed on BIPAP and transferred to stepdown. MIR done on 5/14 revealed Acute left PCA territory infarct. 2. Punctate focus of restricted diffusion in the left corona radiata, consistent with acute infarct. Seen by neurologist. Embolic stroke was a concern-started on  he had carotid doppler normal, EEG encephalopathy changes, underwent TEE on 5/19 which was normal. That night he decompensated and oxygen was upper 80s, was restless, lethargic drowsy, CXR showed cardiomegaly and worsening edema VS infiltrate, was started on vanco/cefepime on 5/20. As he was c/o ruq pain he had [place] which showed cholecystitis. Cefepime changed to zosyn and vanco was stopped. CT abdomen  showed Acute cholecystitis with distended, inflamed gallbladder and surrounding inflammatory stranding. 2. Intermediate sized pleural effusions and small volume perihepatic Ascites. Surgeon recommended IR place a gall bladder drain. As his PTT was very high he received 2 FFP and the procedure postponed for tomorrow  I am seeing the patient for the cholecystitis/pneumonia  Past Medical History:  Diagnosis Date  . CHF (congestive heart failure) (HCC)    diastolic (echo at [facility] 5945) EF 55-60%  . Cirrhosis ([place])    NASH per records  . COPD (chronic obstructive pulmonary disease) ([place])   . Depression   .  Diabetes mellitus with neuropathy ([place])   . Heart attack ([place])   . Neuropathy   . OSA (obstructive sleep apnea)   . Peripheral vascular disease due to secondary diabetes mellitus ([place])   . Splenomegaly   . Temporal giant cell arteritis ([place])   . TIA (transient ischemic attack) April 2016    Past Surgical History:  Procedure Laterality Date  . AORTA - FEMORAL ARTERY BYPASS GRAFT Bilateral   . CARDIAC CATHETERIZATION    . cardiac stents  July 2015   . CORONARY ARTERY BYPASS GRAFT N/A 10/26/2019   Procedure: CORONARY ARTERY BYPASS GRAFTING (CABG) x 5, Using Bilateral mammary arteries, and right leg greater saphenous vein harvested endoscopically;  Surgeon: Wonda Olds, MD;  Location: [place];  Service: Open Heart Surgery;  Laterality: N/A;  . CORONARY STENT INTERVENTION N/A 08/09/2017   Procedure: CORONARY STENT INTERVENTION;  Surgeon: Wellington Hampshire, MD;  Location: [place] [facility];  Service: Cardiovascular;  Laterality: N/A;  . LAPAROSCOPIC APPENDECTOMY N/A 07/10/2015   Procedure: APPENDECTOMY LAPAROSCOPIC;  Surgeon: Rolm Bookbinder, MD;  Location: [place];  Service: General;  Laterality: N/A;  . LEFT HEART CATH AND CORONARY ANGIOGRAPHY N/A 08/09/2017   Procedure: LEFT HEART CATH AND CORONARY ANGIOGRAPHY;  Surgeon: Wellington Hampshire, MD;  Location: [place] [facility];  Service: Cardiovascular;  Laterality: N/A;  . LEFT HEART CATH AND CORONARY ANGIOGRAPHY N/A 10/19/2019   Procedure: LEFT HEART CATH AND CORONARY ANGIOGRAPHY;  Surgeon: Minna Merritts, MD;  Location: [place]  [facility];  Service: Cardiovascular;  Laterality: N/A;  . RADIAL ARTERY HARVEST Left 10/26/2019   Procedure: RADIAL ARTERY HARVEST;  Surgeon: Wonda Olds, MD;  Location: [place];  Service: Open Heart Surgery;  Laterality: Left;  . TEE WITHOUT CARDIOVERSION N/A 10/26/2019   Procedure: TRANSESOPHAGEAL ECHOCARDIOGRAM (TEE);  Surgeon: Wonda Olds, MD;  Location: [place];  Service: Open Heart Surgery;   Laterality: N/A;  . TEE WITHOUT CARDIOVERSION N/A 12/10/2019   Procedure: TRANSESOPHAGEAL ECHOCARDIOGRAM (TEE);  Surgeon: Kate Sable, MD;  Location: [facility];  Service: Cardiovascular;  Laterality: N/A;    Social History   Socioeconomic History  . Marital status: Divorced    Spouse name: Not on file  . Number of children: Not on file  . Years of education: Not on file  . Highest education level: Not on file  Occupational History  . Not on file  Tobacco Use  . Smoking status: Former Smoker    Types: Cigarettes    Quit date: 2009    Years since quitting: 12.3  . Smokeless tobacco: Never Used  Substance and Sexual Activity  . Alcohol use: No    Alcohol/week: 0.0 standard drinks  . Drug use: No  . Sexual activity: Not on file  Other Topics Concern  . Not on file  Social History Narrative  . Not on file   Social Determinants of Health   Financial Resource Strain:   . Difficulty of Paying Living Expenses:   Food Insecurity:   . Worried About Charity fundraiser in the Last Year:   . Arboriculturist in the Last Year:   Transportation Needs:   . Film/video editor (Medical):   Marland Kitchen Lack of Transportation (Non-Medical):   Physical Activity:   . Days of Exercise per Week:   . Minutes of Exercise per Session:   Stress:   . Feeling of Stress :   Social Connections:   . Frequency of Communication with Friends and Family:   . Frequency of Social Gatherings with Friends and Family:   . Attends Religious Services:   . Active Member of Clubs or Organizations:   . Attends Archivist Meetings:   Marland Kitchen Marital Status:   Intimate Partner Violence:   . Fear of Current or Ex-Partner:   . Emotionally Abused:   Marland Kitchen Physically Abused:   . Sexually Abused:     Family History  Problem Relation Age of Onset  . Heart attack Mother 3  . Stroke Mother   . Heart attack Father   . Alzheimer's disease Father    Allergies  Allergen Reactions  . Gabapentin   . Ace  Inhibitors Cough    Other reaction(s): Other (See Comments) cough    ? Current Facility-Administered Medications  Medication Dose Route Frequency Provider Last Rate Last Admin  .  stroke: mapping our early stages of recovery book   Does not apply Once Danford, Suann Larry, MD      . acetaminophen (TYLENOL) tablet 650 mg  650 mg Oral Q6H PRN Edwin Dada, MD   650 mg at 11/25/19 1610   Or  . acetaminophen (TYLENOL) suppository 650 mg  650 mg Rectal Q6H PRN Danford, Suann Larry, MD      . amiodarone (NEXTERONE PREMIX) 360-4.14 MG/200ML-% (1.8 mg/mL) IV infusion  30 mg/hr Intravenous Continuous Minna Merritts, MD 16.67 mL/hr at 12/01/19 1700 30 mg/hr at 12/01/19 1700  . aspirin chewable tablet 81 mg  81 mg Oral  Daily Edwin Dada, MD   81 mg at 11/30/19 1301  . atorvastatin (LIPITOR) tablet 80 mg  80 mg Oral QPM Edwin Dada, MD   80 mg at 12/01/19 1833  . Chlorhexidine Gluconate Cloth 2 % PADS 6 each  6 each Topical Daily Sharion Settler, NP      . ezetimibe (ZETIA) tablet 10 mg  10 mg Oral Daily Danford, Suann Larry, MD   10 mg at 11/30/19 0907  . heparin ADULT infusion 100 units/mL (25000 units/283m sodium chloride 0.45%)  800 Units/hr Intravenous Continuous WCandiss NorseA, PA-C 8 mL/hr at 12/01/19 1700 800 Units/hr at 12/01/19 1700  . hydrALAZINE (APRESOLINE) injection 10 mg  10 mg Intravenous Q6H PRN WWyvonnia Dusky MD   10 mg at 11/30/19 2011  . insulin aspart (novoLOG) injection 0-15 Units  0-15 Units Subcutaneous TID WC Danford, CSuann Larry MD   3 Units at 12/01/19 1622  . insulin aspart (novoLOG) injection 0-5 Units  0-5 Units Subcutaneous QHS Danford, Christopher P, MD      . insulin glargine (LANTUS) injection 5 Units  5 Units Subcutaneous Daily Danford, CSuann Larry MD   5 Units at 12/01/19 1511  . levETIRAcetam (KEPPRA) IVPB 500 mg/100 mL premix  500 mg Intravenous Q8H MSharion Settler NP   Stopped at 12/01/19 1525  . MEDLINE  mouth rinse  15 mL Mouth Rinse BID Danford, CSuann Larry MD   15 mL at 12/01/19 1030  . metoprolol tartrate (LOPRESSOR) injection 2.5 mg  2.5 mg Intravenous Q5 min PRN DRise Mu PA-C      . morphine 2 MG/ML injection 2 mg  2 mg Intravenous Q4H PRN WWyvonnia Dusky MD   2 mg at 11/30/19 1743  . naloxone ([facility] injection 0.4 mg  0.4 mg Intravenous PRN WWyvonnia Dusky MD   0.4 mg at 12/10/2019 2033  . ondansetron (ZOFRAN) tablet 4 mg  4 mg Oral Q6H PRN Danford, CSuann Larry MD       Or  . ondansetron (ZOFRAN) injection 4 mg  4 mg Intravenous Q6H PRN DEdwin Dada MD   4 mg at 11/30/19 1743  . oxyCODONE (Oxy IR/ROXICODONE) immediate release tablet 5 mg  5 mg Oral Q6H PRN WWyvonnia Dusky MD   5 mg at 11/30/19 03734 . pantoprazole (PROTONIX) injection 40 mg  40 mg Intravenous QHS MSharion Settler NP   40 mg at 12/01/19 0025  . piperacillin-tazobactam (ZOSYN) IVPB 3.375 g  3.375 g Intravenous Q8H MSharion Settler NP 12.5 mL/hr at 12/01/19 1700 Rate Verify at 12/01/19 1700  . pregabalin (LYRICA) capsule 50 mg  50 mg Oral TID DEdwin Dada MD   50 mg at 11/30/19 1303  . protein supplement (ENSURE MAX) liquid  11 oz Oral BID DEdwin Dada MD   11 oz at 11/30/19 02876 . venlafaxine (EFFEXOR) tablet 75 mg  75 mg Oral TID DEdwin Dada MD   75 mg at 11/30/19 1304     Abtx:  Anti-infectives (From admission, onward)   Start     Dose/Rate Route Frequency Ordered Stop   12/01/19 0000  piperacillin-tazobactam (ZOSYN) IVPB 3.375 g     3.375 g 12.5 mL/hr over 240 Minutes Intravenous Every 8 hours 11/30/19 2105     11/30/19 1100  vancomycin (VANCOCIN) IVPB 1000 mg/200 mL premix  Status:  Discontinued     1,000 mg 200 mL/hr over 60 Minutes Intravenous Every 12 hours 11/30/19 0303 11/30/19 1314  11/30/19 1000  ceFEPIme (MAXIPIME) 2 g in sodium chloride 0.9 % 100 mL IVPB  Status:  Discontinued     2 g 200 mL/hr over 30 Minutes Intravenous Every 8  hours 11/30/19 0259 11/30/19 2105   11/30/19 0115  vancomycin (VANCOREADY) IVPB 1750 mg/350 mL     1,750 mg 175 mL/hr over 120 Minutes Intravenous  Once 11/30/19 0105 11/30/19 0415   11/28/2019 2230  ceFEPIme (MAXIPIME) 1 g in sodium chloride 0.9 % 100 mL IVPB  Status:  Discontinued     1 g 200 mL/hr over 30 Minutes Intravenous Every 8 hours 11/27/2019 2218 11/30/19 0259   11/24/2019 2000  cefTRIAXone (ROCEPHIN) 2 g in sodium chloride 0.9 % 100 mL IVPB  Status:  Discontinued     2 g 200 mL/hr over 30 Minutes Intravenous Every 24 hours 11/15/2019 1953 11/24/19 0809   11/11/2019 2000  azithromycin (ZITHROMAX) 500 mg in sodium chloride 0.9 % 250 mL IVPB  Status:  Discontinued     500 mg 250 mL/hr over 60 Minutes Intravenous Every 24 hours 11/16/2019 1953 11/24/19 0809      REVIEW OF SYSTEMS: NA Pt does not say much Objective:  VITALS:  BP (!) 143/74   Pulse 92   Temp 97.6 F (36.4 C) (Oral)   Resp 13   Ht 5' 7"  (1.702 m)   Wt 82.2 kg   SpO2 94%   BMI 28.38 kg/m  PHYSICAL EXAM:  General: Awake, but does not talk much- utters a word occasionally. Follows simple commands  Head: Normocephalic, without obvious abnormality, atraumatic. Eyes: Conjunctivae clear, anicteric sclerae. Pupils are equal ENT did not examine Neck: Supple, symmetrical, no adenopathy, thyroid: non tender no carotid bruit and no JVD. Back:did not examine Lungs: decreased air entry bases Heart: irregular Sternal surgical scar- some dehiscence at the lower end- some slough present Abdomen: Soft, , tenderness rt upper quadrant Extremities: graft site left arm healed well atraumatic, no cyanosis. No edema. No clubbing Skin: No rashes or lesions. Or bruising Lymph: Cervical, supraclavicular normal. Neurologic: did not examine in detail Pertinent Labs Lab Results CBC    Component Value Date/Time   WBC 25.9 (H) 12/01/2019 0603   RBC 3.60 (L) 12/01/2019 0603   HGB 10.4 (L) 12/01/2019 0603   HGB 9.3 (L) 11/14/2019  0943   HCT 34.1 (L) 12/01/2019 0603   HCT 28.3 (L) 11/14/2019 0943   PLT 232 12/01/2019 0603   PLT 209 11/14/2019 0943   MCV 94.7 12/01/2019 0603   MCV 94 11/14/2019 0943   MCV 91 11/06/2014 1356   MCH 28.9 12/01/2019 0603   MCHC 30.5 12/01/2019 0603   RDW 19.1 (H) 12/01/2019 0603   RDW 16.2 (H) 11/14/2019 0943   RDW 14.6 (H) 11/06/2014 1356   LYMPHSABS 0.6 (L) 11/14/2019 1208   LYMPHSABS 1.4 11/01/2014 1234   MONOABS 0.7 11/21/2019 1208   MONOABS 0.8 11/01/2014 1234   EOSABS 0.3 11/13/2019 1208   EOSABS 0.3 11/01/2014 1234   BASOSABS 0.0 12/01/2019 1208   BASOSABS 0.1 11/01/2014 1234    CMP Latest Ref Rng & Units 12/01/2019 11/30/2019 11/30/2019  Glucose 70 - 99 mg/dL 233(H) 198(H) 225(H)  BUN 8 - 23 mg/dL 39(H) 33(H) 31(H)  Creatinine 0.61 - 1.24 mg/dL 1.49(H) 1.15 0.96  Sodium 135 - 145 mmol/L 142 142 141  Potassium 3.5 - 5.1 mmol/L 4.1 4.3 4.2  Chloride 98 - 111 mmol/L 96(L) 96(L) 94(L)  CO2 22 - 32 mmol/L 32 32 34(H)  Calcium  8.9 - 10.3 mg/dL 8.8(L) 9.0 8.8(L)  Total Protein 6.5 - 8.1 g/dL 6.9 7.0 -  Total Bilirubin 0.3 - 1.2 mg/dL 9.6(H) 12.5(H) -  Alkaline Phos 38 - 126 U/L 740(H) 761(H) -  AST 15 - 41 U/L 311(H) 504(H) -  ALT 0 - 44 U/L 236(H) 287(H) -      Microbiology: Recent Results (from the past 240 hour(s))  SARS Coronavirus 2 by RT PCR (hospital order, performed in [facility] hospital lab) Nasopharyngeal Nasopharyngeal Swab     Status: None   Collection Time: 11/16/2019  2:05 PM   Specimen: Nasopharyngeal Swab  Result Value Ref Range Status   SARS Coronavirus 2 NEGATIVE NEGATIVE Final    Comment: (NOTE) SARS-CoV-2 target nucleic acids are NOT DETECTED. The SARS-CoV-2 RNA is generally detectable in upper and lower respiratory specimens during the acute phase of infection. The lowest concentration of SARS-CoV-2 viral copies this assay can detect is 250 copies / mL. A negative result does not preclude SARS-CoV-2 infection and should not be used as the  sole basis for treatment or other patient management decisions.  A negative result may occur with improper specimen collection / handling, submission of specimen other than nasopharyngeal swab, presence of viral mutation(s) within the areas targeted by this assay, and inadequate number of viral copies (<250 copies / mL). A negative result must be combined with clinical observations, patient history, and epidemiological information. Fact Sheet for Patients:   StrictlyIdeas.no Fact Sheet for Healthcare Providers: BankingDealers.co.za This test is not yet approved or cleared  by the [place] FDA and has been authorized for detection and/or diagnosis of SARS-CoV-2 by FDA under an Emergency Use Authorization (EUA).  This EUA will remain in effect (meaning this test can be used) for the duration of the COVID-19 declaration under Section 564(b)(1) of the Act, 21 U.S.C. section 360bbb-3(b)(1), unless the authorization is terminated or revoked sooner. Performed at [facility], [address] [place], [place] [address]   Culture, blood (x 2)     Status: None   Collection Time: 11/28/2019  8:54 PM   Specimen: BLOOD  Result Value Ref Range Status   Specimen Description BLOOD RAC  Final   Special Requests BOTTLES DRAWN AEROBIC AND ANAEROBIC BCAV  Final   Culture   Final    NO GROWTH 5 DAYS Performed at [name], [address] [place], [place] [address]    Report Status 11/28/2019 FINAL  Final  Culture, blood (x 2)     Status: None   Collection Time: 11/20/2019  8:54 PM   Specimen: BLOOD  Result Value Ref Range Status   Specimen Description BLOOD [facility]  Final   Special Requests BOTTLES DRAWN AEROBIC AND ANAEROBIC BCAV  Final   Culture   Final    NO GROWTH 5 DAYS Performed at [facility], [address], [place], [place] [address]    Report Status 11/28/2019 FINAL  Final  SARS CORONAVIRUS 2 (TAT 6-24 HRS)  Nasopharyngeal Nasopharyngeal Swab     Status: None   Collection Time: 11/28/19 11:39 AM   Specimen: Nasopharyngeal Swab  Result Value Ref Range Status   SARS Coronavirus 2 NEGATIVE NEGATIVE Final    Comment: (NOTE) SARS-CoV-2 target nucleic acids are NOT DETECTED. The SARS-CoV-2 RNA is generally detectable in upper and lower respiratory specimens during the acute phase of infection. Negative results do not preclude SARS-CoV-2 infection, do not rule out co-infections with other pathogens, and should not be used as  the sole basis for treatment or other patient management decisions. Negative results must be combined with clinical observations, patient history, and epidemiological information. The expected result is Negative. Fact Sheet for Patients: SugarRoll.be Fact Sheet for Healthcare Providers: https://www.woods-mathews.com/ This test is not yet approved or cleared by the [place] FDA and  has been authorized for detection and/or diagnosis of SARS-CoV-2 by FDA under an Emergency Use Authorization (EUA). This EUA will remain  in effect (meaning this test can be used) for the duration of the COVID-19 declaration under Section 56 4(b)(1) of the Act, 21 U.S.C. section 360bbb-3(b)(1), unless the authorization is terminated or revoked sooner. Performed at [facility], [place] [address], [place], [place] [address]   CULTURE, BLOOD (ROUTINE X 2) w Reflex to ID Panel     Status: None (Preliminary result)   Collection Time: 11/19/2019 11:28 PM   Specimen: BLOOD RIGHT HAND  Result Value Ref Range Status   Specimen Description BLOOD RIGHT HAND  Final   Special Requests   Final    BOTTLES DRAWN AEROBIC AND ANAEROBIC Blood Culture adequate volume   Culture   Final    NO GROWTH 2 DAYS Performed at [facility], [address], [place], [place] [address]    Report Status PENDING  Incomplete  CULTURE, BLOOD (ROUTINE X 2) w Reflex to  ID Panel     Status: None (Preliminary result)   Collection Time: 11/27/2019 11:38 PM   Specimen: BLOOD LEFT HAND  Result Value Ref Range Status   Specimen Description BLOOD LEFT HAND  Final   Special Requests   Final    BOTTLES DRAWN AEROBIC AND ANAEROBIC Blood Culture adequate volume   Culture   Final    NO GROWTH 2 DAYS Performed at [facility], [address] [place], [place] [address]    Report Status PENDING  Incomplete  MRSA PCR Screening     Status: None   Collection Time: 11/30/19 11:00 AM   Specimen: Nasal Mucosa; Nasopharyngeal  Result Value Ref Range Status   MRSA by PCR NEGATIVE NEGATIVE Final    Comment:        The GeneXpert MRSA Assay (FDA approved for NASAL specimens only), is one component of a comprehensive MRSA colonization surveillance program. It is not intended to diagnose MRSA infection nor to guide or monitor treatment for MRSA infections. Performed at [facility], [address] [place], [place] [address]     IMAGING RESULTS: Distended and sludge filled gallbladder with gallbladder wall thickening and pericholecystic fluid. Findings are consistent with acute cholecystitis and in keeping with prior CT.2. No biliary ductal dilatation. Central common bile duct is poorly visualized, likely due to a combination of breath motion artifact and edema. No obvious obstructing calculus or other lesion.3. Small volume perihepatic ascites. 4. Moderate bilateral pleural effusions and associated atelectasis or consolidation of the bilateral lung bases.  I have personally reviewed the films ? Impression/Recommendation ?59 y.o. male with a history of CABG 10/2019, COPD, DM , afib , PVD s/p aortofemoral bypass, CKD, Larry Terrell was admitted from SNF on 11/21/2019 after fall  ? ?Acute hypoxic resp failure- likely due to CHF Has b/l pleural effusion- intermittent worsening Had received lasix Less likely this is pneumonia - will repeat procal- first reading  was not elevated and was 0.12  Recent CABG - hospital course hen complicated by Afib which could account for chf  Surgical scar not healed completely and there is some slough  NEW  Left PCA stroke- TEE neg for embolic source, carotid doppler okay  Leucocytosis could be from acute cholecystitis- if after perc cholecystostomy it does not improve he will need CT mediastinum to look at integrity of the sternum Currently on zosyn- depending on cultures from [facility] can de-escalate  Afib after CABG on amiodarone /eliquis  Anemia post surgery  AKI on CKD ___________________________________________________ Discussed with his nurse .

## 2019-12-01 NOTE — Consult Note (Addendum)
Chief Complaint: Patient was seen in consultation today for percutaneous cholecystostomy placement.  Referring Physician(s): Piscoya, Jose  Supervising Physician: Arne Cleveland  Patient Status: [place] - In-pt  History of Present Illness: Larry Terrell is a 59 y.o. male with a past medical history significant for depression, OSA, temporal giant cell arteritis, COPD, DM, TIA, CHF, a.fib on Eliquis and NASH cirrhosis seen today for percutaneous cholecystostomy placement. Larry Terrell presented to the ED on 11/26/2019 after a fall where he [facility]. During initial evaluation he was noted to have an increase in supplemental oxygen requirement and a dose of Lasix was ordered, however when the nurse went to administer the medicine she found the patient to be altered and hypoxic (80s on 4L/min) and he was subsequently placed in BiPAP. He was admitted for further evaluation and management. He was noted to be more confused than when he initially presented and a CT head w/o contrast was performed on 5/14 which did not show any acute abnormality. An MRI brain was then performed which showed an acute left PCA infarct as well as punctate acute infarct in the left corona radiata. Neurology was consulted and is following.  On 5/20 Larry Terrell complained of abdominal pain and was noted to have jaundice, tachycardia, elevated WBC and LFTs. He underwent a RUQ [place] which showed distended gallbladder with sludge but without definite sonographic evidence of acute cholecystitis, cirrhotic appearing liver. A follow up CT A/P w/contrast was performed which showed acute cholecystitis with distended, inflamed gallbladder with surrounding inflammatory stranding. General surgery was consulted for possible cholecystitis however he was deemed to be a poor surgical candidate due to recent stroke and CABG. IR has been consulted for possible percutaneous cholecystostomy placement.  Larry Terrell was seen in  the ICU today - he is alert, asking for water as his mouth is dry. He denies any pain currently. He is able to tell me his name, birthday and that he is at [facility] but cannot name the day of the week, month or year. We discussed the procedure briefly and he seemed to understand the basic idea of what is happening but he was mostly focused on getting water. He tells me that his brother Larry Terrell signs paperwork for him - attempted to contact Larry Terrell without success, I was able to get in touch with patient's sister Larry Terrell via phone today and she is agreeable to the procedure and gives verbal consent to proceed. Per patient's RN APTT >160 this morning and heparin has been held. Past Medical History:  Diagnosis Date  . CHF (congestive heart failure) (HCC)    diastolic (echo at [facility] [id number]) EF 55-60%  . Cirrhosis ([place])    NASH per records  . COPD (chronic obstructive pulmonary disease) ([place])   . Depression   . Diabetes mellitus with neuropathy ([place])   . Heart attack ([place])   . Neuropathy   . OSA (obstructive sleep apnea)   . Peripheral vascular disease due to secondary diabetes mellitus ([place])   . Splenomegaly   . Temporal giant cell arteritis ([place])   . TIA (transient ischemic attack) April 2016    Past Surgical History:  Procedure Laterality Date  . AORTA - FEMORAL ARTERY BYPASS GRAFT Bilateral   . CARDIAC CATHETERIZATION    . cardiac stents  July 2015   . CORONARY ARTERY BYPASS GRAFT N/A 10/26/2019   Procedure: CORONARY ARTERY BYPASS GRAFTING (CABG) x 5, Using Bilateral mammary arteries, and right leg  greater saphenous vein harvested endoscopically;  Surgeon: Wonda Olds, MD;  Location: [place];  Service: Open Heart Surgery;  Laterality: N/A;  . CORONARY STENT INTERVENTION N/A 08/09/2017   Procedure: CORONARY STENT INTERVENTION;  Surgeon: Wellington Hampshire, MD;  Location: [place] [facility];  Service: Cardiovascular;  Laterality: N/A;  . LAPAROSCOPIC APPENDECTOMY N/A 07/10/2015   Procedure:  APPENDECTOMY LAPAROSCOPIC;  Surgeon: Rolm Bookbinder, MD;  Location: [place];  Service: General;  Laterality: N/A;  . LEFT HEART CATH AND CORONARY ANGIOGRAPHY N/A 08/09/2017   Procedure: LEFT HEART CATH AND CORONARY ANGIOGRAPHY;  Surgeon: Wellington Hampshire, MD;  Location: [place] [facility];  Service: Cardiovascular;  Laterality: N/A;  . LEFT HEART CATH AND CORONARY ANGIOGRAPHY N/A 10/19/2019   Procedure: LEFT HEART CATH AND CORONARY ANGIOGRAPHY;  Surgeon: Minna Merritts, MD;  Location: [place] [facility];  Service: Cardiovascular;  Laterality: N/A;  . RADIAL ARTERY HARVEST Left 10/26/2019   Procedure: RADIAL ARTERY HARVEST;  Surgeon: Wonda Olds, MD;  Location: [place];  Service: Open Heart Surgery;  Laterality: Left;  . TEE WITHOUT CARDIOVERSION N/A 10/26/2019   Procedure: TRANSESOPHAGEAL ECHOCARDIOGRAM (TEE);  Surgeon: Wonda Olds, MD;  Location: [place];  Service: Open Heart Surgery;  Laterality: N/A;  . TEE WITHOUT CARDIOVERSION N/A 11/19/2019   Procedure: TRANSESOPHAGEAL ECHOCARDIOGRAM (TEE);  Surgeon: Kate Sable, MD;  Location: [facility];  Service: Cardiovascular;  Laterality: N/A;    Allergies: Gabapentin and Ace inhibitors  Medications: Prior to Admission medications   Medication Sig Start Date End Date Taking? Authorizing Provider  acetaminophen (TYLENOL) 500 MG tablet Take 500-1,000 mg by mouth daily as needed for mild pain or fever.    Yes [provider]  amiodarone (PACERONE) 200 MG tablet Take 1 tablet (200 mg total) by mouth daily. 11/14/19  Yes Loel Dubonnet, NP  apixaban (ELIQUIS) 5 MG TABS tablet Take 1 tablet (5 mg total) by mouth 2 (two) times daily. 11/06/19  Yes Barrett, Erin R, PA-C  aspirin 81 MG chewable tablet Chew 1 tablet (81 mg total) by mouth daily. 10/20/19  Yes Nicole Kindred A, DO  atorvastatin (LIPITOR) 80 MG tablet Take 1 tablet (80 mg total) by mouth daily. Patient taking differently: Take 80 mg by mouth every evening.  10/21/19   Yes Minna Merritts, MD  ezetimibe (ZETIA) 10 MG tablet Take 1 tablet (10 mg total) by mouth daily. Patient taking differently: Take 10 mg by mouth daily in the afternoon.  10/20/19  Yes Minna Merritts, MD  famotidine (PEPCID) 20 MG tablet Take 1 tablet (20 mg total) by mouth 2 (two) times daily. 12/26/17  Yes Paulette Blanch, MD  insulin aspart (NOVOLOG) 100 UNIT/ML injection Inject 0-15 Units into the skin 4 (four) times daily - after meals and at bedtime. Patient taking differently: Inject 0-20 Units into the skin 4 (four) times daily - after meals and at bedtime. Inject according to blood sugar readings:  < 60u: 0u and contact MD 120-160: 2u 161-200: 4u 201-250: 8u 251-300: 12u 301-350: 16u 351-450: contact MD 10/20/19  Yes Nicole Kindred A, DO  insulin aspart (NOVOLOG) 100 UNIT/ML injection Inject 10 Units into the skin 3 (three) times daily with meals. 10/20/19  Yes Nicole Kindred A, DO  insulin glargine (LANTUS) 100 UNIT/ML injection Inject 0.05 mLs (5 Units total) into the skin daily. 10/20/19  Yes Nicole Kindred A, DO  isosorbide dinitrate (ISORDIL) 20 MG tablet Take 1 tablet (20 mg total) by mouth  3 (three) times daily. For 1 month, then discontinue Patient taking differently: Take 20 mg by mouth 3 (three) times daily.  11/06/19  Yes Barrett, Erin R, PA-C  levETIRAcetam (KEPPRA) 500 MG tablet Take 500 mg by mouth 3 (three) times daily. 10/03/19  Yes [provider]  metFORMIN (GLUCOPHAGE) 500 MG tablet Take 500 mg by mouth 2 (two) times daily with a meal.   Yes [provider]  Metoprolol Tartrate 37.5 MG TABS Take 37.5 mg by mouth 2 (two) times daily. 11/06/19  Yes Barrett, Erin R, PA-C  nitroGLYCERIN (NITROSTAT) 0.4 MG SL tablet Place 0.4 mg under the tongue every 5 (five) minutes as needed for chest pain.   Yes [provider]  oxyCODONE (OXY IR/ROXICODONE) 5 MG immediate release tablet Take 1-2 tablets (5-10 mg total) by mouth every 4 (four) hours as needed for  severe pain. 11/06/19  Yes Barrett, Erin R, PA-C  pantoprazole (PROTONIX) 40 MG tablet Take 1 tablet (40 mg total) by mouth daily. 10/20/19  Yes Nicole Kindred A, DO  pregabalin (LYRICA) 50 MG capsule Take 1 capsule (50 mg total) by mouth 3 (three) times daily. 10/20/19 10/19/20 Yes Ezekiel Slocumb, DO  therapeutic multivitamin-minerals [facility]) tablet Take 1 tablet by mouth daily.   Yes [provider]  venlafaxine (EFFEXOR) 75 MG tablet Take 75 mg by mouth 3 (three) times daily. 05/11/19  Yes [provider]  furosemide (LASIX) 40 MG tablet Take 1 tablet (40 mg total) by mouth 2 (two) times daily for 5 days. 12/09/2019 11/28/19  Carrie Mew, MD     Family History  Problem Relation Age of Onset  . Heart attack Mother 56  . Stroke Mother   . Heart attack Father   . Alzheimer's disease Father     Social History   Socioeconomic History  . Marital status: Divorced    Spouse name: Not on file  . Number of children: Not on file  . Years of education: Not on file  . Highest education level: Not on file  Occupational History  . Not on file  Tobacco Use  . Smoking status: Former Smoker    Types: Cigarettes    Quit date: 2009    Years since quitting: 12.3  . Smokeless tobacco: Never Used  Substance and Sexual Activity  . Alcohol use: No    Alcohol/week: 0.0 standard drinks  . Drug use: No  . Sexual activity: Not on file  Other Topics Concern  . Not on file  Social History Narrative  . Not on file   Social Determinants of Health   Financial Resource Strain:   . Difficulty of Paying Living Expenses:   Food Insecurity:   . Worried About Charity fundraiser in the Last Year:   . Arboriculturist in the Last Year:   Transportation Needs:   . Film/video editor (Medical):   Marland Kitchen Lack of Transportation (Non-Medical):   Physical Activity:   . Days of Exercise per Week:   . Minutes of Exercise per Session:   Stress:   . Feeling of Stress :   Social  Connections:   . Frequency of Communication with Friends and Family:   . Frequency of Social Gatherings with Friends and Family:   . Attends Religious Services:   . Active Member of Clubs or Organizations:   . Attends Archivist Meetings:   Marland Kitchen Marital Status:      Review of Systems: A 12 point ROS  discussed and pertinent positives are indicated in the HPI above.  All other systems are negative.  Review of Systems  Unable to perform ROS: Mental status change (Very limited ROS)  HENT:       (+) dry mouth  Gastrointestinal: Negative for abdominal pain and vomiting.    Vital Signs: BP (!) 154/87   Pulse (!) 103   Temp 97.6 F (36.4 C) (Axillary)   Resp 13   Ht 5' 7"  (1.702 m)   Wt 181 lb 3.5 oz (82.2 kg)   SpO2 96%   BMI 28.38 kg/m   Physical Exam Vitals and nursing note reviewed.  Constitutional:      General: He is not in acute distress.    Appearance: He is ill-appearing.  HENT:     Head: Normocephalic.     Mouth/Throat:     Mouth: Mucous membranes are dry.     Pharynx: Oropharynx is clear. No oropharyngeal exudate or posterior oropharyngeal erythema.  Cardiovascular:     Rate and Rhythm: Regular rhythm. Tachycardia present.  Pulmonary:     Effort: Pulmonary effort is normal.     Comments: Decreased breath sounds bilaterally - anterior exam only (+) supplemental O2 via [place] Abdominal:     General: There is no distension.     Palpations: Abdomen is soft.     Tenderness: There is abdominal tenderness (RUQ). There is guarding (RUQ).  Skin:    General: Skin is warm and dry.     Coloration: Skin is jaundiced.  Neurological:     Mental Status: He is alert.     Comments: Oriented to self and "Iva" but cannot answer any other orientation questions presented to him today.      MD Evaluation Airway: WNL Heart: WNL Abdomen: WNL Chest/ Lungs: WNL ASA  Classification: 3 Mallampati/Airway Score: Two   Imaging: EEG  Result Date:  11/24/2019 Alexis Goodell, MD     11/24/2019  4:44 PM ELECTROENCEPHALOGRAM REPORT Patient: Evonnie Dawes       Room #: 249A-AA EEG No. ID: [id number] Age: 59 y.o.        Sex: male Requesting Physician: [place] Report Date:  11/24/2019       Interpreting Physician: Alexis Goodell History: Sabas Frett is an 59 y.o. male with altered mental status Medications: Amiodarone, Eliquis, ASA, Insulin, Lasix, Keppra, Lopressor, Lyrica, Effexor, Zetia, Lipitor Conditions of Recording:  This is a 21 channel routine scalp EEG performed with bipolar and monopolar montages arranged in accordance to the international 10/20 system of electrode placement. One channel was dedicated to EKG recording. The patient is in the awake state. Description:  The background activity is slow and poorly organized.  It consists of low voltage activity in the delta-theta continuum.  This activity is diffusely distributed and continuous throughout the recording.  No epileptiform activity is noted.  Hyperventilation was not performed. Intermittent photic stimulation was performed but failed to illicit any change in the tracing. IMPRESSION: This is an abnormal EEG secondary to general background slowing.  This finding may be seen with a diffuse disturbance that is etiologically nonspecific, but may include a metabolic encephalopathy, among other possibilities.  No epileptiform activity was noted.  Alexis Goodell, MD Neurology [phone] 11/24/2019, 4:41 PM   DG Chest 2 View  Result Date: 11/25/2019 CLINICAL DATA:  Pleural effusion EXAM: CHEST - 2 VIEW COMPARISON:  11/28/2019 FINDINGS: Post CABG changes. Stable cardiomegaly. Progressive interstitial and alveolar opacities throughout both lungs. Small to moderate bilateral pleural effusions.  No pneumothorax. IMPRESSION: Progressive bilateral interstitial and alveolar opacities, which may reflect pulmonary edema versus multifocal pneumonia. Small to moderate bilateral pleural effusions.  Electronically Signed   By: Davina Poke D.O.   On: 11/25/2019 17:02   DG Chest 2 View  Result Date: 11/16/2019 CLINICAL DATA:  Shortness of breath, fall EXAM: CHEST - 2 VIEW COMPARISON:  11/13/2019 FINDINGS: Prior CABG. Stable cardiomegaly. Diffusely increased interstitial markings throughout both lungs with patchy scattered patchy opacities in the bilateral lung bases and peripheral aspect of the right upper lobe. No large pleural fluid collection. No pneumothorax. Degenerative changes of the thoracic spine suggesting DISH. IMPRESSION: Findings suggestive of congestive heart failure with pulmonary edema. A superimposed infectious process would be difficult to exclude. Electronically Signed   By: Davina Poke D.O.   On: 12/05/2019 12:37   DG Chest 2 View  Result Date: 11/13/2019 CLINICAL DATA:  Status post recent coronary artery bypass grafting EXAM: CHEST - 2 VIEW COMPARISON:  November 04, 2019 FINDINGS: There is no appreciable edema or airspace opacity. There is cardiomegaly with pulmonary vascularity within normal limits. Patient is status post coronary artery bypass grafting. No adenopathy. There is degenerative change in the thoracic spine. IMPRESSION: Cardiomegaly. Status post coronary artery bypass grafting. No edema or airspace opacity. Electronically Signed   By: Lowella Grip III M.D.   On: 11/13/2019 14:04   DG Chest 2 View  Result Date: 11/04/2019 CLINICAL DATA:  Pleural effusion EXAM: CHEST - 2 VIEW COMPARISON:  November 01, 2019 FINDINGS: The right PICC line is in good position. Cardiomegaly. The hila and mediastinum are normal. No pneumothorax. Mild atelectasis in the right base. No focal infiltrate. No change in the cardiomediastinal silhouette. IMPRESSION: Stable right PICC line. Mild right basilar atelectasis. No other changes. Electronically Signed   By: Dorise Bullion III M.D   On: 11/04/2019 13:47   DG Chest 2 View  Result Date: 11/01/2019 CLINICAL DATA:  Shortness of  breath. EXAM: CHEST - 2 VIEW COMPARISON:  October 30, 2019. FINDINGS: Stable cardiomegaly. No pneumothorax or pleural effusion is noted. Right-sided PICC line is unchanged in position. Left lung is clear. Minimal right basilar subsegmental atelectasis is noted. Bony thorax is unremarkable. IMPRESSION: Minimal right basilar subsegmental atelectasis. Electronically Signed   By: Marijo Conception M.D.   On: 11/01/2019 10:31   DG Abd 1 View  Result Date: 11/18/2019 CLINICAL DATA:  Abdominal pain and distention. EXAM: ABDOMEN - 1 VIEW COMPARISON:  None. FINDINGS: The stomach is distended. Bowel gas pattern is otherwise nonobstructive. There is a moderate amount of stool in the colon and rectum. A left common iliac stent is noted. There is no definite pneumatosis or free air. IMPRESSION: Distended stomach, otherwise the bowel gas pattern is nonobstructive. Electronically Signed   By: Constance Holster M.D.   On: 11/22/2019 21:11   CT HEAD WO CONTRAST  Result Date: 11/11/2019 CLINICAL DATA:  Fall. EXAM: CT HEAD WITHOUT CONTRAST TECHNIQUE: Contiguous axial images were obtained from the base of the skull through the vertex without intravenous contrast. COMPARISON:  MR brain and CT head dated April 01, 2019. FINDINGS: Brain: No evidence of acute infarction, hemorrhage, hydrocephalus, extra-axial collection or mass lesion/mass effect. Stable mild atrophy and chronic microvascular ischemic changes. Small remote lacunar infarct in the right cerebellum again noted. Vascular: Calcified atherosclerosis at the skullbase. No hyperdense vessel. Skull: Normal. Negative for fracture or focal lesion. Sinuses/Orbits: No acute finding. Other: None. IMPRESSION: No acute intracranial abnormality. Electronically Signed  By: Titus Dubin M.D.   On: 12/02/2019 12:47   MR BRAIN WO CONTRAST  Result Date: 11/24/2019 CLINICAL DATA:  Focal neurological deficit. Stroke suspected. EXAM: MRI HEAD WITHOUT CONTRAST TECHNIQUE:  Multiplanar, multiecho pulse sequences of the brain and surrounding structures were obtained without intravenous contrast. COMPARISON:  Head CT Nov 23, 2019 FINDINGS: Brain: Area of restricted diffusion involving the splenium of the corpus callosum on the left side, posterior aspect of the left fornix, lateral geniculate body, left occipital lobe and medial temporal lobe, including the body of the left hippocampus. Findings are consistent with a left PCA territory infarct. A single focus of restricted diffusion is also seen in the left corona radiata. Remote lacunar infarcts are seen in the right cerebellar hemisphere. Scattered foci of T2 hyperintensity are seen within the white matter of cerebral hemispheres and within the pons, nonspecific, most likely related to chronic small vessel ischemia. More pronounced than expected for age. Mild prominence of the ventricular system and cerebellar sulci reflecting parenchymal volume loss. There is no hemorrhage, hydrocephalus, extra-axial collection or mass lesion. Vascular: Diminutive caliber of the horizontal petrous segment of the left ICA. Remainder of the major arterial flow voids are preserved. Skull and upper cervical spine: Normal marrow signal. Sinuses/Orbits: Left lens surgery. Other: None. IMPRESSION: 1. Acute left PCA territory infarct. 2. Punctate focus of restricted diffusion in the left corona radiata, consistent with acute infarct. 3. Diminutive caliber of the horizontal petrous segment of the left ICA. 4. 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 5. Moderate chronic small vessel ischemic changes. More pronounced than expected for age. These results were called by telephone at the time of interpretation on 11/24/2019 at 12:13 pm to provider [facility] , who verbally acknowledged these results. Electronically Signed   By: Pedro Earls M.D.   On: 11/24/2019 12:19   CT ABDOMEN PELVIS W CONTRAST  Result Date:  11/30/2019 CLINICAL DATA:  Right upper quadrant pain EXAM: CT ABDOMEN AND PELVIS WITH CONTRAST TECHNIQUE: Multidetector CT imaging of the abdomen and pelvis was performed using the standard protocol following bolus administration of intravenous contrast. CONTRAST:  123m OMNIPAQUE IOHEXOL 300 MG/ML  SOLN COMPARISON:  Abdominal ultrasound 11/30/2019 FINDINGS: LOWER CHEST: Intermediate sized bilateral pleural effusions with bibasilar atelectasis. HEPATOBILIARY: Small volume perihepatic ascites. Gallbladder is distended with surrounding inflammatory stranding. There is mild hyperenhancement of the gallbladder fossa. PANCREAS: Normal pancreas. No ductal dilatation or peripancreatic fluid collection. SPLEEN: Areas of hypoattenuation AP due to heterogeneous enhancement, but are more likely due to streak artifacts. ADRENALS/URINARY TRACT: The adrenal glands are normal. No hydronephrosis, nephroureterolithiasis or solid renal mass. The urinary bladder is normal for degree of distention STOMACH/BOWEL: There is no hiatal hernia. Normal duodenal course and caliber. No small bowel dilatation or inflammation. No focal colonic abnormality. Status post appendectomy. 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 There is calcific aortic atherosclerosis. No abdominal or pelvic lymphadenopathy. REPRODUCTIVE: Enlarged prostate measures 5.5 cm in transverse dimension. MUSCULOSKELETAL. Multilevel degenerative disc disease and facet arthrosis. No bony spinal canal stenosis. OTHER: None. IMPRESSION: 1. Acute cholecystitis with distended, inflamed gallbladder and surrounding inflammatory stranding. 2. Intermediate sized pleural effusions and small volume perihepatic ascites. 3. Aortic Atherosclerosis (ICD10-I70.0). Electronically Signed   By: KUlyses JarredM.D.   On: 11/30/2019 22:39   [facility]  Result Date: 11/24/2019 CLINICAL DATA:  59year old male with  stroke-like symptoms EXAM: BILATERAL CAROTID DUPLEX ULTRASOUND TECHNIQUE: GPearline Cablesscale  imaging, color Doppler and duplex ultrasound were performed of bilateral carotid and vertebral arteries in the neck. COMPARISON:  Prior duplex carotid ultrasound 11/06/2014 FINDINGS: Criteria: Quantification of carotid stenosis is based on velocity parameters that correlate the residual internal carotid diameter with NASCET-based stenosis levels, using the diameter of the distal internal carotid lumen as the denominator for stenosis measurement. The following velocity measurements were obtained: RIGHT ICA: 70/28 cm/sec CCA: 98/92 cm/sec SYSTOLIC ICA/CCA RATIO:  0.8 ECA:  142 cm/sec LEFT ICA: 53/23 cm/sec CCA: 119/41 cm/sec SYSTOLIC ICA/CCA RATIO:  0.5 ECA:  188 cm/sec RIGHT CAROTID ARTERY: Mild heterogeneous atherosclerotic plaque in the proximal internal carotid artery. By peak systolic velocity criteria, the estimated stenosis is less than 50%. RIGHT VERTEBRAL ARTERY:  Patent with antegrade flow. LEFT CAROTID ARTERY: Mild heterogeneous atherosclerotic plaque in the proximal internal carotid artery. By peak systolic velocity criteria, the estimated stenosis is less than 50%. LEFT VERTEBRAL ARTERY:  Patent with antegrade flow. IMPRESSION: 1. Mild (1-49%) stenosis proximal right internal carotid artery secondary to mild heterogeneous atherosclerotic plaque. 2. Mild (1-49%) stenosis proximal left internal carotid artery secondary to mild heterogeneous atherosclerotic plaque. 3. The vertebral arteries are patent with normal antegrade flow. Signed, Criselda Peaches, MD, [facility] Radiology Electronically Signed   By: Jacqulynn Cadet M.D.   On: 11/24/2019 15:37   DG Chest Port 1 View  Result Date: 11/14/2019 CLINICAL DATA:  Acute respiratory distress. EXAM: PORTABLE CHEST 1 VIEW COMPARISON:  Nov 25, 2019 FINDINGS: There are worsening airspace opacities bilaterally. There is  cardiomegaly. The patient is status post prior median sternotomy. There are small bilateral pleural effusions. There is no pneumothorax. The lung volumes are low. IMPRESSION: Cardiomegaly with worsening bilateral airspace opacities which may represent worsening congestive heart failure or multifocal pneumonia. Electronically Signed   By: Constance Holster M.D.   On: 11/30/2019 21:13   MR ABDOMEN MRCP W WO CONTAST  Result Date: 12/01/2019 CLINICAL DATA:  Cholecystitis, evaluate CBD EXAM: MRI ABDOMEN WITHOUT AND WITH CONTRAST (INCLUDING MRCP) TECHNIQUE: Multiplanar multisequence MR imaging of the abdomen was performed both before and after the administration of intravenous contrast. Heavily T2-weighted images of the biliary and pancreatic ducts were obtained, and three-dimensional MRCP images were rendered by post processing. CONTRAST:  52m GADAVIST GADOBUTROL 1 MMOL/ML IV SOLN COMPARISON:  CT abdomen pelvis, 11/30/2019 FINDINGS: Lower chest: Moderate bilateral pleural effusions and associated atelectasis or consolidation of the bilateral lung bases. Cardiomegaly. Hepatobiliary: No mass or other parenchymal abnormality identified. The gallbladder is distended and sludge filled, with gallbladder wall thickening and pericholecystic fluid. There is no biliary ductal dilatation. Central common bile duct is poorly visualized, likely due to a combination of breath motion artifact and edema. Pancreas: No mass, inflammatory changes, or other parenchymal abnormality identified. Pancreatic duct is nondilated. Spleen:  Within normal limits in size and appearance. Adrenals/Urinary Tract: No masses identified. No evidence of hydronephrosis. Stomach/Bowel: Visualized portions within the abdomen are unremarkable. Vascular/Lymphatic: No pathologically enlarged lymph nodes identified. No abdominal aortic aneurysm demonstrated. Other:  Small volume perihepatic ascites. Musculoskeletal: No suspicious bone lesions identified.  IMPRESSION: 1. Distended and sludge filled gallbladder with gallbladder wall thickening and pericholecystic fluid. Findings are consistent with acute cholecystitis and in keeping with prior CT. 2. No biliary ductal dilatation. Central common bile duct is poorly visualized, likely due to a combination of breath motion artifact and edema. No obvious obstructing calculus or other lesion. 3. Small volume perihepatic ascites. 4. Moderate bilateral pleural  effusions and associated atelectasis or consolidation of the bilateral lung bases. Electronically Signed   By: Eddie Candle M.D.   On: 12/01/2019 08:11   ECHOCARDIOGRAM COMPLETE  Result Date: 11/24/2019    ECHOCARDIOGRAM REPORT   Patient Name:   [facility] LEE Leverette Date of Exam: 11/24/2019 Medical Rec #:  [id number]       Height:       67.0 in Accession #:    [id number]      Weight:       191.4 lb Date of Birth:  [birth year]        BSA:          1.985 m Patient Age:    77 years        BP:           152/75 mmHg Patient Gender: M               HR:           67 bpm. Exam Location:  [facility] Procedure: 2D Echo, Cardiac Doppler and Color Doppler Indications:     Stroke 434.91  History:         Patient has prior history of Echocardiogram examinations, most                  recent 10/26/2019. TIA and COPD; Risk Factors:Diabetes and Sleep                  Apnea.  Sonographer:     Sherrie Sport RDCS (AE) Referring Phys:  [id number] Suann Larry DANFORD Diagnosing Phys: Ida Rogue MD  Sonographer Comments: Suboptimal apical window. IMPRESSIONS  1. Left ventricular ejection fraction, by estimation, is 55 to 60%. The left ventricle has normal function. The left ventricle has no regional wall motion abnormalities. There is mild left ventricular hypertrophy. Left ventricular diastolic parameters are indeterminate.  2. Right ventricular systolic function is normal. The right ventricular size is normal. There is normal pulmonary artery systolic pressure.  3. Left atrial size was moderately  dilated.  4. Mild mitral valve regurgitation.  5. Images suggestive of left pleural effusion FINDINGS  Left Ventricle: Left ventricular ejection fraction, by estimation, is 55 to 60%. The left ventricle has normal function. The left ventricle has no regional wall motion abnormalities. The left ventricular internal cavity size was normal in size. There is  mild left ventricular hypertrophy. Left ventricular diastolic parameters are indeterminate. Right Ventricle: The right ventricular size is normal. No increase in right ventricular wall thickness. Right ventricular systolic function is normal. There is normal pulmonary artery systolic pressure. The tricuspid regurgitant velocity is 2.44 m/s, and  with an assumed right atrial pressure of 10 mmHg, the estimated right ventricular systolic pressure is 70.0 mmHg. Left Atrium: Left atrial size was moderately dilated. Right Atrium: Right atrial size was normal in size. Pericardium: A small pericardial effusion is present. Mitral Valve: The mitral valve is normal in structure. Normal mobility of the mitral valve leaflets. Mild mitral valve regurgitation. No evidence of mitral valve stenosis. Tricuspid Valve: The tricuspid valve is normal in structure. Tricuspid valve regurgitation is not demonstrated. No evidence of tricuspid stenosis. Aortic Valve: The aortic valve is normal in structure. Aortic valve regurgitation is not visualized. No aortic stenosis is present. Aortic valve mean gradient measures 5.7 mmHg. Aortic valve peak gradient measures 11.4 mmHg. Aortic valve area, by VTI measures 1.68 cm. Pulmonic Valve: The pulmonic valve was normal in structure. Pulmonic valve regurgitation is not visualized. No  evidence of pulmonic stenosis. Aorta: The aortic root is normal in size and structure. Venous: The inferior vena cava is normal in size with greater than 50% respiratory variability, suggesting right atrial pressure of 3 mmHg. IAS/Shunts: No atrial level shunt detected  by color flow Doppler.  LEFT VENTRICLE PLAX 2D LVIDd:         3.38 cm LVIDs:         2.28 cm LV PW:         1.19 cm LV IVS:        0.97 cm LVOT diam:     2.00 cm LV SV:         50 LV SV Index:   25 LVOT Area:     3.14 cm  RIGHT VENTRICLE RV Basal diam:  3.36 cm RV S prime:     10.00 cm/s TAPSE (M-mode): 3.2 cm LEFT ATRIUM             Index       RIGHT ATRIUM           Index LA diam:        5.30 cm 2.67 cm/m  RA Area:     21.10 cm LA Vol (A2C):   77.4 ml 39.00 ml/m RA Volume:   61.70 ml  31.09 ml/m LA Vol (A4C):   82.1 ml 41.37 ml/m LA Biplane Vol: 80.1 ml 40.36 ml/m  AORTIC VALVE                    PULMONIC VALVE AV Area (Vmax):    1.88 cm     PV Vmax:        0.69 m/s AV Area (Vmean):   1.84 cm     PV Peak grad:   1.9 mmHg AV Area (VTI):     1.68 cm     RVOT Peak grad: 2 mmHg AV Vmax:           168.67 cm/s AV Vmean:          111.333 cm/s AV VTI:            0.299 m AV Peak Grad:      11.4 mmHg AV Mean Grad:      5.7 mmHg LVOT Vmax:         101.00 cm/s LVOT Vmean:        65.200 cm/s LVOT VTI:          0.160 m LVOT/AV VTI ratio: 0.53  AORTA Ao Root diam: 2.90 cm MITRAL VALVE                TRICUSPID VALVE MV Area (PHT): 4.77 cm     TR Peak grad:   23.8 mmHg MV Decel Time: 159 msec     TR Vmax:        244.00 cm/s MV E velocity: 131.00 cm/s                             SHUNTS                             Systemic VTI:  0.16 m                             Systemic Diam: 2.00 cm Ida Rogue MD Electronically signed by Ida Rogue MD Signature Date/Time: 11/24/2019/2:50:13  PM    Final    ECHO TEE  Result Date: 11/16/2019    TRANSESOPHOGEAL ECHO REPORT   Patient Name:   [facility] LEE Waldvogel Date of Exam: 12/02/2019 Medical Rec #:  [id number]       Height:       67.0 in Accession #:    [id number]      Weight:       184.0 lb Date of Birth:  Jul 19, 1960        BSA:          1.952 m Patient Age:    1 years        BP:           163/74 mmHg Patient Gender: M               HR:           85 bpm. Exam Location:  [facility]  Procedure: Transesophageal Echo, Cardiac Doppler and Color Doppler Indications:     I63.9 Stroke  History:         Patient has prior history of Echocardiogram examinations, most                  recent 11/24/2019. CHF, TIA, PVD and COPD; Risk Factors:Sleep                  Apnea and Diabetes.  Sonographer:     Charmayne Sheer RDCS (AE) Referring Phys:  [id number] Arvil Chaco Diagnosing Phys: Kate Sable MD PROCEDURE: The transesophogeal probe was passed without difficulty through the esophogus of the patient. Sedation performed by performing physician. The patient developed no complications during the procedure. IMPRESSIONS  1. Left ventricular ejection fraction, by estimation, is 55 to 60%. The left ventricle has normal function. The left ventricle has no regional wall motion abnormalities.  2. Right ventricular systolic function is normal. The right ventricular size is normal.  3. Left atrial size was moderately dilated. No left atrial/left atrial appendage thrombus was detected.  4. Right atrial size was mildly dilated.  5. The mitral valve is normal in structure. Mild mitral valve regurgitation.  6. Aortic valve lambl's excrescence noted. The aortic valve is tricuspid. Aortic valve regurgitation is trivial. Conclusion(s)/Recommendation(s): No LA/LAA thrombus identified. No intracardiac source of embolism detected on this on this transesophageal echocardiogram. FINDINGS  Left Ventricle: Left ventricular ejection fraction, by estimation, is 55 to 60%. The left ventricle has normal function. The left ventricle has no regional wall motion abnormalities. The left ventricular internal cavity size was normal in size. There is  no left ventricular hypertrophy. Right Ventricle: The right ventricular size is normal. No increase in right ventricular wall thickness. Right ventricular systolic function is normal. Left Atrium: Left atrial size was moderately dilated. No left atrial/left atrial appendage thrombus was  detected. Right Atrium: Right atrial size was mildly dilated. Pericardium: There is no evidence of pericardial effusion. Mitral Valve: The mitral valve is normal in structure. Mild mitral valve regurgitation. Tricuspid Valve: The tricuspid valve is not well visualized. Tricuspid valve regurgitation is mild. Aortic Valve: Aortic valve lambl's excrescence noted. The aortic valve is tricuspid. Aortic valve regurgitation is trivial. Pulmonic Valve: The pulmonic valve was normal in structure. Pulmonic valve regurgitation is trivial. Aorta: The aortic root is normal in size and structure. Venous: The inferior vena cava was not well visualized. IAS/Shunts: No atrial level shunt detected by color flow Doppler. Kate Sable MD Electronically signed by Kate Sable MD Signature Date/Time: 11/30/2019/11:59:04 AM  Final    US Abdomen Limited RUQ  Result Date: 11/30/2019 CLINICAL DATA:  Pain. EXAM: ULTRASOUND ABDOMEN LIMITED RIGHT UPPER QUADRANT COMPARISON:  Nov 29, 2015 ultrasound. FINDINGS: Gallbladder: The gallbladder is distended. There is borderline gallbladder wall thickening. The sonographic Percell Miller sign is negative. There is gallbladder sludge. Common bile duct: Diameter: 3 mm Liver: The liver is nodular with a coarsened hepatic echotexture. Portal vein is patent on color Doppler imaging with normal direction of blood flow towards the liver. Other: There is a trace amount of ascites. There is a right-sided pleural effusion. IMPRESSION: 1. Distended gallbladder sludge without definite sonographic evidence for acute cholecystitis. 2. Cirrhotic appearing liver. 3. Small volume abdominal ascites. 4. Incidentally noted right-sided pleural effusion. Electronically Signed   By: Constance Holster M.D.   On: 11/30/2019 19:02    Labs:  CBC: Recent Labs    11/19/2019 0434 12/07/2019 2111 11/30/19 0241 12/01/19 0603  WBC 7.6 15.8* 17.8* 25.9*  HGB 9.6* 11.1* 10.7* 10.4*  HCT 29.1* 33.5* 32.7* 34.1*  PLT 177  237 216 232    COAGS: Recent Labs    10/19/19 1740 10/26/19 1458 11/20/2019 1208 11/30/19 2309 12/01/19 0603  INR 1.0 1.4* 1.9* 2.2*  --   APTT 36 40*  --  52* >160*    BMP: Recent Labs    11/11/2019 2111 11/30/19 0241 11/30/19 2105 12/01/19 0603  NA 139 141 142 142  K 4.1 4.2 4.3 4.1  CL 95* 94* 96* 96*  CO2 33* 34* 32 32  GLUCOSE 216* 225* 198* 233*  BUN 31* 31* 33* 39*  CALCIUM 9.0 8.8* 9.0 8.8*  CREATININE 1.21 0.96 1.15 1.49*  GFRNONAA >60 >60 >60 50*  GFRAA >60 >60 >60 58*    LIVER FUNCTION TESTS: Recent Labs    11/17/2019 1208 11/24/19 0015 11/30/19 2105 12/01/19 0603  BILITOT 1.0 0.7 12.5* 9.6*  AST 19 16 504* 311*  ALT 13 12 287* 236*  ALKPHOS 100 97 761* 740*  PROT 6.9 6.7 7.0 6.9  ALBUMIN 3.1* 2.8* 2.6* 2.5*    TUMOR MARKERS: No results for input(s): AFPTM, CEA, CA199, CHROMGRNA in the last 8760 hours.  Assessment and Plan:  59 y/o M with multiple medical problems as per HPI recently found to have acute cholecystitis associated with jaundice, tachycardia, elevated WBC and LFTs. General surgery has been consulted who does not feel that patient is a surgical candidate due to recent CVA and CABG, as such IR has been asked to place a percutaneous cholecystostomy. Patient history and imaging have been reviewed by Dr. Vernard Gambles today who agrees to procedure - however most recent labs show APTT >160 today, INR 2.2 yesterday. Due to significant increase in bleeding risk we have asked that these be corrected prior to proceeding with placement unless the patient become significantly unstable, I have discussed this with primary team who will address. Tmax 98.8, tachycardic, hypertensive, spO2 96% on 15 L/min via [place]. WBC 25.9, hgb 10.4, plt 232, creatinine 1.49, AST 311, ALT 236, ALP 740, t.bili 9.6.  IR will continue to follow labs today - plan to proceed today if labs can be corrected, if not we will reassess tomorrow AM. Patient to remain NPO for now until timing of  procedure is decided.  Risks and benefits were discussed with the patient's sister Larry Terrell including, but not limited to, bleeding, infection, gallbladder perforation, bile leak, sepsis or even death.  All of the patient's sister's questions were answered, patient's sister is agreeable to proceed.  Verbal consent obtained and is in chart.  ADDENDUM 1354: Upon chart review patient received Eliquis 5 mg x 1 yesterday as well as prior day - discussed with Dr. Vernard Gambles and given his significant bleeding risk currently we will plan on procedure tomorrow 5/22 in IR at approximately 0900 (pending any emergent procedures which may delay case). Patient is be NPO/hold tube feeds at midnight, heparin gtt to be held at 0630 on 5/22 and may be restarted shortly after procedure, INR/APTT ordered for tomorrow at 0500. Patient's sister, RN and Dr. Jimmye Norman have been updated on this plan. Please call on call IR physician at [phone] with any questions or concerns.   Thank you for this interesting consult.  I greatly enjoyed meeting Linzie Jameis Newsham and look forward to participating in their care.  A copy of this report was sent to the requesting provider on this date.  Electronically Signed: Joaquim Nam, PA-C 12/01/2019, 8:48 AM   I spent a total of 45 Mi[place]utes  in face to face in clinical consultation, greater than 50% of which was counseling/coordinating care for percutaneous cholecystostomy.

## 2019-12-01 NOTE — Progress Notes (Addendum)
PROGRESS NOTE    Larry Terrell  YIF:[id number] DOB: June 14, 1961 DOA: 11/19/2019 PCP: Medicine, [place]:   Principal Problem:   Acute on chronic heart failure with preserved ejection fraction (HFpEF) (HCC) Active Problems:   Diabetes mellitus with complication (HCC)   CAD in native artery   Essential hypertension   PAD (peripheral artery disease) (HCC)   Encephalopathy   Persistent atrial fibrillation (HCC)   Stroke (cerebrum) (HCC)   Acute on chronic hypoxic respiratory failure: secondary to chronic systolic and diastolic CHF.  Echo during hospitalization for CABG last month showed EF 45%. Not on ARB due to renal dysfunction.Continue on supplemental oxygen. Per records from last hospital stay and interim office notes, he was discharged after CABG at [facility] on 3L.  Continue on lasix, plan for d/c on 29m BID. Strict I/Os, daily weights. 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 On admission, patient noted to be confused, no focal deficits noted, but concern for vision changes. Not tPA candidate given on eliquis. MRI obtained due to sudden mental status change shows new left PCA infarct involving the splenium of the corpus callosum, posterior aspect of the left fornix, left occipital lobe, left medial temporal lobe, and body of the left hippocampus. Echo showed no cardiogenic source of embolism. Continue atorvastatin, zetia, aspirin. Hold eliquis & continue on IV heparin as per cardio. Hx of a. fib. TEE 11/26/2019 did not show a thrombus as per cardio   Pneumonia: continue on IV zosyn. Procal slightly elevated.  Encourage incentive spirometry. Continue on supplemental oxygen and wean as tolerated.  ID consulted   Acute cholecystitis: as per [place] MRCP shows no biliary ductal dilatation. Will need a percutaneous cholecystostomy tube placed by IR & will likely place tomorrow as per IR. Will give 2 units of FFP for elevated INR. Not  a good surgical candidate at this point as per gen surg. Continue on IV zosyn. ID consulted. Gen surg following & recs apprec   Transaminitis: likely secondary to acute cholecystitis.Will continue to monitor   Hyperbilirubinemia: likely secondary to acute cholecystitis. Management as stated above   Possible cirrhosis: as per [place] Hx of  NASH.   Leukocytosis: secondary to infection. Will continue on IV abxs.   Acute metabolic encephalopathy: superimposed on baseline cognitive impairment.  PT/OT recs SNF.  GERD: continue on famotidine, pantoprazole  CKD stage IIIb: Cr is labile. Will continue to monitor   DM2: continue lantus, SSI w/ accuchecks.   COPD: w/o exacerbation. Continue w/ supportive care  Depression: severity unknown. Continue venlafaxine, pregabalin   PAF: w/ RVR. S/p CABG. Started on IV amiodarone and IV heparin as per cardio. Hold eliquis   History of seizures: continue keppra   DVT prophylaxis: eliquis Code Status: full  Family Communication:  Disposition Plan: will likely d/c to SNF.  Status is: Inpatient  Remains inpatient appropriate because:IV treatments appropriate due to intensity of illness or inability to take PO   Dispo: The patient is from: Home              Anticipated d/c is to: SNF              Anticipated d/c date is: >3 days               Patient currently is not medically stable to d/c.      Consultants:  Cardio  ID General surg   Procedures: TEE  12/11/2019 no thrombus noted   Antimicrobials: zosyn   Subjective: Pt c/o RUQ abd pain    Objective: Vitals:   11/30/19 2200 11/30/19 2300 12/01/19 0000 12/01/19 0100  BP: (!) 161/91 (!) 152/112 (!) 150/99 (!) 148/74  Pulse: (!) 127 (!) 125 (!) 116 (!) 103  Resp: 14 13 12  (!) 21  Temp:    98.8 F (37.1 C)  TempSrc:    Oral  SpO2: 95% 96% 96% 96%  Weight:    82.2 kg  Height:    5' 7"  (1.702 m)    Intake/Output Summary (Last 24 hours) at 12/01/2019 0829 Last data filed at  12/01/2019 0128 Gross per 24 hour  Intake 287.29 ml  Output --  Net 287.29 ml   Filed Weights   12/11/2019 0527 11/30/19 0300 12/01/19 0100  Weight: 83.5 kg 82.5 kg 82.2 kg    Examination:  General exam: Appears calm but uncomfortable  Respiratory system: diminished breath sounds b/l.  Cardiovascular system: S1 & S2 +. No rubs, gallops or clicks.. Gastrointestinal system: Abdomen is nondistended, soft and tenderness palpation of RUQ. Hypoactive bowel sounds heard. Central nervous system: Moves all 4 extremities   Psychiatry: Judgement and insight appear normal. Flat mood and affect      Data Reviewed: I have personally reviewed following labs and imaging studies  CBC: Recent Labs  Lab 11/28/19 0609 11/24/2019 0434 11/21/2019 2111 11/30/19 0241 12/01/19 0603  WBC 7.8 7.6 15.8* 17.8* 25.9*  HGB 10.0* 9.6* 11.1* 10.7* 10.4*  HCT 31.5* 29.1* 33.5* 32.7* 34.1*  MCV 92.6 91.2 90.1 90.3 94.7  PLT 209 177 237 216 659   Basic Metabolic Panel: Recent Labs  Lab 11/28/2019 0434 12/11/2019 2111 11/30/19 0241 11/30/19 2105 11/30/19 2107 12/01/19 0603  NA 139 139 141 142  --  142  K 3.6 4.1 4.2 4.3  --  4.1  CL 95* 95* 94* 96*  --  96*  CO2 35* 33* 34* 32  --  32  GLUCOSE 172* 216* 225* 198*  --  233*  BUN 34* 31* 31* 33*  --  39*  CREATININE 1.37* 1.21 0.96 1.15  --  1.49*  CALCIUM 8.7* 9.0 8.8* 9.0  --  8.8*  MG  --   --   --   --  1.9  --    GFR: Estimated Creatinine Clearance: 53.4 mL/min (A) (by C-G formula based on SCr of 1.49 mg/dL (H)). Liver Function Tests: Recent Labs  Lab 11/30/19 2105 12/01/19 0603  AST 504* 311*  ALT 287* 236*  ALKPHOS 761* 740*  BILITOT 12.5* 9.6*  PROT 7.0 6.9  ALBUMIN 2.6* 2.5*   No results for input(s): LIPASE, AMYLASE in the last 168 hours. No results for input(s): AMMONIA in the last 168 hours. Coagulation Profile: Recent Labs  Lab 11/30/19 2309  INR 2.2*   Cardiac Enzymes: No results for input(s): CKTOTAL, CKMB, CKMBINDEX,  TROPONINI in the last 168 hours. BNP (last 3 results) No results for input(s): PROBNP in the last 8760 hours. HbA1C: No results for input(s): HGBA1C in the last 72 hours. CBG: Recent Labs  Lab 12/10/2019 2038 11/30/19 0745 11/30/19 1152 11/30/19 1602 11/30/19 2144  GLUCAP 196* 211* 224* 189* 191*   Lipid Profile: No results for input(s): CHOL, HDL, LDLCALC, TRIG, CHOLHDL, LDLDIRECT in the last 72 hours. Thyroid Function Tests: No results for input(s): TSH, T4TOTAL, FREET4, T3FREE, THYROIDAB in the last 72 hours. Anemia Panel: No results for input(s): VITAMINB12, FOLATE, FERRITIN, TIBC, IRON, RETICCTPCT in  the last 72 hours. Sepsis Labs: Recent Labs  Lab 11/30/2019 2111 12/11/2019 2327 11/30/19 0241  PROCALCITON 0.12  --   --   LATICACIDVEN  --  1.1 1.2    Recent Results (from the past 240 hour(s))  SARS Coronavirus 2 by RT PCR (hospital order, performed in [facility] hospital lab) Nasopharyngeal Nasopharyngeal Swab     Status: None   Collection Time: 11/27/2019  2:05 PM   Specimen: Nasopharyngeal Swab  Result Value Ref Range Status   SARS Coronavirus 2 NEGATIVE NEGATIVE Final    Comment: (NOTE) SARS-CoV-2 target nucleic acids are NOT DETECTED. The SARS-CoV-2 RNA is generally detectable in upper and lower respiratory specimens during the acute phase of infection. The lowest concentration of SARS-CoV-2 viral copies this assay can detect is 250 copies / mL. A negative result does not preclude SARS-CoV-2 infection and should not be used as the sole basis for treatment or other patient management decisions.  A negative result may occur with improper specimen collection / handling, submission of specimen other than nasopharyngeal swab, presence of viral mutation(s) within the areas targeted by this assay, and inadequate number of viral copies (<250 copies / mL). A negative result must be combined with clinical observations, patient history, and epidemiological information. Fact  Sheet for Patients:   StrictlyIdeas.no Fact Sheet for Healthcare Providers: BankingDealers.co.za This test is not yet approved or cleared  by the [place] FDA and has been authorized for detection and/or diagnosis of SARS-CoV-2 by FDA under an Emergency Use Authorization (EUA).  This EUA will remain in effect (meaning this test can be used) for the duration of the COVID-19 declaration under Section 564(b)(1) of the Act, 21 U.S.C. section 360bbb-3(b)(1), unless the authorization is terminated or revoked sooner. Performed at [facility], [address] [place], [place] [address]   Culture, blood (x 2)     Status: None   Collection Time: 12/07/2019  8:54 PM   Specimen: BLOOD  Result Value Ref Range Status   Specimen Description BLOOD RAC  Final   Special Requests BOTTLES DRAWN AEROBIC AND ANAEROBIC BCAV  Final   Culture   Final    NO GROWTH 5 DAYS Performed at [facility], [address], [place], [place] [address]    Report Status 11/28/2019 FINAL  Final  Culture, blood (x 2)     Status: None   Collection Time: 11/24/2019  8:54 PM   Specimen: BLOOD  Result Value Ref Range Status   Specimen Description BLOOD BRH  Final   Special Requests BOTTLES DRAWN AEROBIC AND ANAEROBIC BCAV  Final   Culture   Final    NO GROWTH 5 DAYS Performed at [facility], [address], [place], [place] [address]    Report Status 11/28/2019 FINAL  Final  SARS CORONAVIRUS 2 (TAT 6-24 HRS) Nasopharyngeal Nasopharyngeal Swab     Status: None   Collection Time: 11/28/19 11:39 AM   Specimen: Nasopharyngeal Swab  Result Value Ref Range Status   SARS Coronavirus 2 NEGATIVE NEGATIVE Final    Comment: (NOTE) SARS-CoV-2 target nucleic acids are NOT DETECTED. The SARS-CoV-2 RNA is generally detectable in upper and lower respiratory specimens during the acute phase of infection. Negative results do not preclude SARS-CoV-2  infection, do not rule out co-infections with other pathogens, and should not be used as the sole basis for treatment or other patient management decisions. Negative results must be combined with clinical observations, patient history, and epidemiological information. The expected result  is Negative. Fact Sheet for Patients: SugarRoll.be Fact Sheet for Healthcare Providers: https://www.woods-mathews.com/ This test is not yet approved or cleared by the [place] FDA and  has been authorized for detection and/or diagnosis of SARS-CoV-2 by FDA under an Emergency Use Authorization (EUA). This EUA will remain  in effect (meaning this test can be used) for the duration of the COVID-19 declaration under Section 56 4(b)(1) of the Act, 21 U.S.C. section 360bbb-3(b)(1), unless the authorization is terminated or revoked sooner. Performed at [facility], [place] [address] [place], [place] [address]   CULTURE, BLOOD (ROUTINE X 2) w Reflex to ID Panel     Status: None (Preliminary result)   Collection Time: 11/15/2019 11:28 PM   Specimen: BLOOD RIGHT HAND  Result Value Ref Range Status   Specimen Description BLOOD RIGHT HAND  Final   Special Requests   Final    BOTTLES DRAWN AEROBIC AND ANAEROBIC Blood Culture adequate volume   Culture   Final    NO GROWTH 2 DAYS Performed at [facility], [address], [place], [place] [address]    Report Status PENDING  Incomplete  CULTURE, BLOOD (ROUTINE X 2) w Reflex to ID Panel     Status: None (Preliminary result)   Collection Time: 12/02/2019 11:38 PM   Specimen: BLOOD LEFT HAND  Result Value Ref Range Status   Specimen Description BLOOD LEFT HAND  Final   Special Requests   Final    BOTTLES DRAWN AEROBIC AND ANAEROBIC Blood Culture adequate volume   Culture   Final    NO GROWTH 2 DAYS Performed at [facility], [address] [place], [place] [address]    Report Status PENDING   Incomplete  MRSA PCR Screening     Status: None   Collection Time: 11/30/19 11:00 AM   Specimen: Nasal Mucosa; Nasopharyngeal  Result Value Ref Range Status   MRSA by PCR NEGATIVE NEGATIVE Final    Comment:        The GeneXpert MRSA Assay (FDA approved for NASAL specimens only), is one component of a comprehensive MRSA colonization surveillance program. It is not intended to diagnose MRSA infection nor to guide or monitor treatment for MRSA infections. Performed at [facility], [address] [place], [place] [address]          Radiology Studies: DG Abd 1 View  Result Date: 12/10/2019 CLINICAL DATA:  Abdominal pain and distention. EXAM: ABDOMEN - 1 VIEW COMPARISON:  None. FINDINGS: The stomach is distended. Bowel gas pattern is otherwise nonobstructive. There is a moderate amount of stool in the colon and rectum. A left common iliac stent is noted. There is no definite pneumatosis or free air. IMPRESSION: Distended stomach, otherwise the bowel gas pattern is nonobstructive. Electronically Signed   By: Constance Holster M.D.   On: 11/16/2019 21:11   CT ABDOMEN PELVIS W CONTRAST  Result Date: 11/30/2019 CLINICAL DATA:  Right upper quadrant pain EXAM: CT ABDOMEN AND PELVIS WITH CONTRAST TECHNIQUE: Multidetector CT imaging of the abdomen and pelvis was performed using the standard protocol following bolus administration of intravenous contrast. CONTRAST:  146m OMNIPAQUE IOHEXOL 300 MG/ML  SOLN COMPARISON:  Abdominal ultrasound 11/30/2019 FINDINGS: LOWER CHEST: Intermediate sized bilateral pleural effusions with bibasilar atelectasis. HEPATOBILIARY: Small volume perihepatic ascites. Gallbladder is distended with surrounding inflammatory stranding. There is mild hyperenhancement of the gallbladder fossa. PANCREAS: Normal pancreas. No ductal dilatation or peripancreatic fluid collection. SPLEEN: Areas of hypoattenuation AP due to heterogeneous enhancement,  but are more likely  due to streak artifacts. ADRENALS/URINARY TRACT: The adrenal glands are normal. No hydronephrosis, nephroureterolithiasis or solid renal mass. The urinary bladder is normal for degree of distention STOMACH/BOWEL: There is no hiatal hernia. Normal duodenal course and caliber. No small bowel dilatation or inflammation. No focal colonic abnormality. Status post appendectomy. 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 There is calcific aortic atherosclerosis. No abdominal or pelvic lymphadenopathy. REPRODUCTIVE: Enlarged prostate measures 5.5 cm in transverse dimension. MUSCULOSKELETAL. Multilevel degenerative disc disease and facet arthrosis. No bony spinal canal stenosis. OTHER: None. IMPRESSION: 1. Acute cholecystitis with distended, inflamed gallbladder and surrounding inflammatory stranding. 2. Intermediate sized pleural effusions and small volume perihepatic ascites. 3. Aortic Atherosclerosis (ICD10-I70.0). Electronically Signed   By: Ulyses Jarred M.D.   On: 11/30/2019 22:39   DG Chest Port 1 View  Result Date: 12/07/2019 CLINICAL DATA:  Acute respiratory distress. EXAM: PORTABLE CHEST 1 VIEW COMPARISON:  Nov 25, 2019 FINDINGS: There are worsening airspace opacities bilaterally. There is cardiomegaly. The patient is status post prior median sternotomy. There are small bilateral pleural effusions. There is no pneumothorax. The lung volumes are low. IMPRESSION: Cardiomegaly with worsening bilateral airspace opacities which may represent worsening congestive heart failure or multifocal pneumonia. Electronically Signed   By: Constance Holster M.D.   On: 11/25/2019 21:13   MR ABDOMEN MRCP W WO CONTAST  Result Date: 12/01/2019 CLINICAL DATA:  Cholecystitis, evaluate CBD EXAM: MRI ABDOMEN WITHOUT AND WITH CONTRAST (INCLUDING MRCP) TECHNIQUE: Multiplanar multisequence MR imaging of the abdomen was performed both before and after the administration of intravenous contrast.  Heavily T2-weighted images of the biliary and pancreatic ducts were obtained, and three-dimensional MRCP images were rendered by post processing. CONTRAST:  41m GADAVIST GADOBUTROL 1 MMOL/ML IV SOLN COMPARISON:  CT abdomen pelvis, 11/30/2019 FINDINGS: Lower chest: Moderate bilateral pleural effusions and associated atelectasis or consolidation of the bilateral lung bases. Cardiomegaly. Hepatobiliary: No mass or other parenchymal abnormality identified. The gallbladder is distended and sludge filled, with gallbladder wall thickening and pericholecystic fluid. There is no biliary ductal dilatation. Central common bile duct is poorly visualized, likely due to a combination of breath motion artifact and edema. Pancreas: No mass, inflammatory changes, or other parenchymal abnormality identified. Pancreatic duct is nondilated. Spleen:  Within normal limits in size and appearance. Adrenals/Urinary Tract: No masses identified. No evidence of hydronephrosis. Stomach/Bowel: Visualized portions within the abdomen are unremarkable. Vascular/Lymphatic: No pathologically enlarged lymph nodes identified. No abdominal aortic aneurysm demonstrated. Other:  Small volume perihepatic ascites. Musculoskeletal: No suspicious bone lesions identified. IMPRESSION: 1. Distended and sludge filled gallbladder with gallbladder wall thickening and pericholecystic fluid. Findings are consistent with acute cholecystitis and in keeping with prior CT. 2. No biliary ductal dilatation. Central common bile duct is poorly visualized, likely due to a combination of breath motion artifact and edema. No obvious obstructing calculus or other lesion. 3. Small volume perihepatic ascites. 4. Moderate bilateral pleural effusions and associated atelectasis or consolidation of the bilateral lung bases. Electronically Signed   By: AEddie CandleM.D.   On: 12/01/2019 08:11   ECHO TEE  Result Date: 11/22/2019    TRANSESOPHOGEAL ECHO REPORT   [facility] Date of Exam: 11/19/2019 Medical Rec #:  [id number]      Height:       67.0 in Accession #:    [id number]     Weight:       184.0 lb Date  of Birth:  12-02-1960        BSA:          1.952 m Patient Age:    59 years        BP:           163/74 mmHg Patient Gender: M               HR:           85 bpm. Exam Location:  [facility] Procedure: Transesophageal Echo, Cardiac Doppler and Color Doppler Indications:     I63.9 Stroke  History:         Patient has prior history of Echocardiogram examinations, most                  recent 11/24/2019. CHF, TIA, PVD and COPD; Risk Factors:Sleep                  Apnea and Diabetes.  Sonographer:     Charmayne Sheer RDCS (AE) Referring Phys:  [id number] Arvil Chaco Diagnosing Phys: Kate Sable MD PROCEDURE: The transesophogeal probe was passed without difficulty through the esophogus of the patient. Sedation performed by performing physician. The patient developed no complications during the procedure. IMPRESSIONS  1. Left ventricular ejection fraction, by estimation, is 55 to 60%. The left ventricle has normal function. The left ventricle has no regional wall motion abnormalities.  2. Right ventricular systolic function is normal. The right ventricular size is normal.  3. Left atrial size was moderately dilated. No left atrial/left atrial appendage thrombus was detected.  4. Right atrial size was mildly dilated.  5. The mitral valve is normal in structure. Mild mitral valve regurgitation.  6. Aortic valve lambl's excrescence noted. The aortic valve is tricuspid. Aortic valve regurgitation is trivial. Conclusion(s)/Recommendation(s): No LA/LAA thrombus identified. No intracardiac source of embolism detected on this on this transesophageal echocardiogram. FINDINGS  Left Ventricle: Left ventricular ejection fraction, by estimation, is 55 to 60%. The left ventricle has normal function. The left ventricle has no regional wall motion abnormalities. The left ventricular internal  cavity size was normal in size. There is  no left ventricular hypertrophy. Right Ventricle: The right ventricular size is normal. No increase in right ventricular wall thickness. Right ventricular systolic function is normal. Left Atrium: Left atrial size was moderately dilated. No left atrial/left atrial appendage thrombus was detected. Right Atrium: Right atrial size was mildly dilated. Pericardium: There is no evidence of pericardial effusion. Mitral Valve: The mitral valve is normal in structure. Mild mitral valve regurgitation. Tricuspid Valve: The tricuspid valve is not well visualized. Tricuspid valve regurgitation is mild. Aortic Valve: Aortic valve lambl's excrescence noted. The aortic valve is tricuspid. Aortic valve regurgitation is trivial. Pulmonic Valve: The pulmonic valve was normal in structure. Pulmonic valve regurgitation is trivial. Aorta: The aortic root is normal in size and structure. Venous: The inferior vena cava was not well visualized. IAS/Shunts: No atrial level shunt detected by color flow Doppler. Kate Sable MD Electronically signed by Kate Sable MD Signature Date/Time: 11/14/2019/11:59:04 AM    Final    US Abdomen Limited RUQ  Result Date: 11/30/2019 CLINICAL DATA:  Pain. EXAM: ULTRASOUND ABDOMEN LIMITED RIGHT UPPER QUADRANT COMPARISON:  Nov 29, 2015 ultrasound. FINDINGS: Gallbladder: The gallbladder is distended. There is borderline gallbladder wall thickening. The sonographic Percell Miller sign is negative. There is gallbladder sludge. Common bile duct: Diameter: 3 mm Liver: The liver is nodular with a coarsened hepatic echotexture. Portal vein is patent on  color Doppler imaging with normal direction of blood flow towards the liver. Other: There is a trace amount of ascites. There is a right-sided pleural effusion. IMPRESSION: 1. Distended gallbladder sludge without definite sonographic evidence for acute cholecystitis. 2. Cirrhotic appearing liver. 3. Small volume abdominal  ascites. 4. Incidentally noted right-sided pleural effusion. Electronically Signed   By: Constance Holster M.D.   On: 11/30/2019 19:02        Scheduled Meds: .  stroke: mapping our early stages of recovery book   Does not apply Once  . amiodarone  200 mg Oral Daily  . aspirin  81 mg Oral Daily  . atorvastatin  80 mg Oral QPM  . Chlorhexidine Gluconate Cloth  6 each Topical Daily  . ezetimibe  10 mg Oral Daily  . furosemide  40 mg Intravenous Q12H  . insulin aspart  0-15 Units Subcutaneous TID WC  . insulin aspart  0-5 Units Subcutaneous QHS  . insulin glargine  5 Units Subcutaneous Daily  . mouth rinse  15 mL Mouth Rinse BID  . metoprolol tartrate  7.5 mg Intravenous Q6H  . pantoprazole (PROTONIX) IV  40 mg Intravenous QHS  . potassium chloride  40 mEq Oral Daily  . pregabalin  50 mg Oral TID  . Ensure Max Protein  11 oz Oral BID  . venlafaxine  75 mg Oral TID   Continuous Infusions: . amiodarone 30 mg/hr (12/01/19 0238)  . heparin 1,000 Units/hr (12/01/19 0128)  . levETIRAcetam 500 mg (12/01/19 0551)  . piperacillin-tazobactam 12.5 mL/hr at 12/01/19 0128     LOS: 8 days    Time spent: 30 mins    Wyvonnia Dusky, MD [facility] Pager [phone]  If 7PM-7AM, please contact night-coverage [email or web address] 12/01/2019, 8:29 AM  '

## 2019-12-01 NOTE — Progress Notes (Signed)
OT Cancellation Note  Patient Details Name: Larry Terrell MRN: [id number] DOB: 07-29-1960   Cancelled Treatment:    Reason Eval/Treat Not Completed: Medical issues which prohibited therapy. Chart reviewed. Pt transferred to ICU overnight 2/2 increased oxygen requirements and start of amio for tachycardia. Will complete current order. Please re-consult when pt becomes more medically appropriate for participation in therapy.   Jeni Salles, MPH, MS, OTR/L ascom [phone] 12/01/19, 7:57 AM

## 2019-12-01 NOTE — Progress Notes (Signed)
Transported over with pt to ICU 16. Changed pt from NRB to 15L HFNC.pt SPO2 94%. Placed V-60 at bedside. Report given to ICU RT.

## 2019-12-01 NOTE — Progress Notes (Signed)
PT Cancellation Note  Patient Details Name: Larry Terrell MRN: [id number] DOB: 08-Feb-1961   Cancelled Treatment:     Per PT protocol, will compete orders now that pt has had change in status and transferred to ICU. Please re-consult when pt is medically appropriate.    Willette Pa 12/01/2019, 8:02 AM

## 2019-12-01 NOTE — Progress Notes (Signed)
SLP F/U Note  Patient Details Name: Larry Terrell MRN: [id number] DOB: Sep 19, 1960   Cancelled treatment:       Reason Eval/Treat Not Completed: (chart reviewed; medical change of status; ICU admit).  Pt was initially seen at the end of last week for a Cognitive-linguistic evaluation s/p new left PCA infract w/ recommendations for skilled ST services at Discharge for ongoing formal assessment and treatment targeting Cognitive-linguistic skills and abilities to increase pt's functional communication in ADLs and reduce caregiver burden once in a structured setting. He was on a mech soft diet as has been recommended previously d/t missing Dentition; aspiration precautions. NSG reported good toleration of diet at that time; no reports of s/s of aspiration per chart notes.  Pt was transferred to ICU yesterday per chart notes; Surgery consulted and determined as per CT "a very distended gallbladder with diffuse surrounding stranding, consistent with acute cholecystitis". Surgery is following for management of acute cholecystitis and possible choledocholithiasis. Per Cardiology note yesterday, "possible pneumonia, unable to exclude aspiration from TEE performed yesterday. Was fine yesterday per family, acute decline yesterday evening". Pt is on increased O2 support; currently NPO per Surgery for possible placement of percutaneous cholecystostomy tube today. As pt has transferred to ICU, and recommendations for Cognitive-linguistic therapy have been made for post Discharge to next venue of care, current order will be completed. Please reconsult if any new needs arise while admitted.     Orinda Kenner, MS, CCC-SLP Derriana Oser 12/01/2019, 10:05 AM

## 2019-12-02 ENCOUNTER — Other Ambulatory Visit (HOSPITAL_COMMUNITY): Payer: Medicaid Other

## 2019-12-02 ENCOUNTER — Inpatient Hospital Stay: Payer: Medicare HMO

## 2019-12-02 DIAGNOSIS — J9622 Acute and chronic respiratory failure with hypercapnia: Secondary | ICD-10-CM

## 2019-12-02 DIAGNOSIS — I5189 Other ill-defined heart diseases: Secondary | ICD-10-CM

## 2019-12-02 DIAGNOSIS — K819 Cholecystitis, unspecified: Secondary | ICD-10-CM

## 2019-12-02 DIAGNOSIS — I509 Heart failure, unspecified: Secondary | ICD-10-CM

## 2019-12-02 DIAGNOSIS — I63331 Cerebral infarction due to thrombosis of right posterior cerebral artery: Secondary | ICD-10-CM

## 2019-12-02 DIAGNOSIS — E8881 Metabolic syndrome: Secondary | ICD-10-CM

## 2019-12-02 DIAGNOSIS — J9621 Acute and chronic respiratory failure with hypoxia: Secondary | ICD-10-CM

## 2019-12-02 DIAGNOSIS — K810 Acute cholecystitis: Secondary | ICD-10-CM

## 2019-12-02 DIAGNOSIS — J984 Other disorders of lung: Secondary | ICD-10-CM

## 2019-12-02 DIAGNOSIS — J80 Acute respiratory distress syndrome: Secondary | ICD-10-CM | POA: Clinically undetermined

## 2019-12-02 HISTORY — PX: IR PERC CHOLECYSTOSTOMY: IMG2326

## 2019-12-02 LAB — PROCALCITONIN: Procalcitonin: 1.6 ng/mL

## 2019-12-02 LAB — PREPARE FRESH FROZEN PLASMA
Unit division: 0
Unit division: 0

## 2019-12-02 LAB — COMPREHENSIVE METABOLIC PANEL
ALT: 142 U/L — ABNORMAL HIGH (ref 0–44)
AST: 137 U/L — ABNORMAL HIGH (ref 15–41)
Albumin: 2.5 g/dL — ABNORMAL LOW (ref 3.5–5.0)
Alkaline Phosphatase: 500 U/L — ABNORMAL HIGH (ref 38–126)
Anion gap: 13 (ref 5–15)
BUN: 40 mg/dL — ABNORMAL HIGH (ref 8–23)
CO2: 34 mmol/L — ABNORMAL HIGH (ref 22–32)
Calcium: 8.6 mg/dL — ABNORMAL LOW (ref 8.9–10.3)
Chloride: 96 mmol/L — ABNORMAL LOW (ref 98–111)
Creatinine, Ser: 1.7 mg/dL — ABNORMAL HIGH (ref 0.61–1.24)
GFR calc Af Amer: 49 mL/min — ABNORMAL LOW (ref >60)
GFR calc non Af Amer: 43 mL/min — ABNORMAL LOW (ref >60)
Glucose, Bld: 169 mg/dL — ABNORMAL HIGH (ref 70–99)
Potassium: 3.5 mmol/L (ref 3.5–5.1)
Sodium: 143 mmol/L (ref 135–145)
Total Bilirubin: 5.9 mg/dL — ABNORMAL HIGH (ref 0.3–1.2)
Total Protein: 6.7 g/dL (ref 6.5–8.1)

## 2019-12-02 LAB — CBC
HCT: 27.9 % — ABNORMAL LOW (ref 39.0–52.0)
Hemoglobin: 8.6 g/dL — ABNORMAL LOW (ref 13.0–17.0)
MCH: 29.2 pg (ref 26.0–34.0)
MCHC: 30.8 g/dL (ref 30.0–36.0)
MCV: 94.6 fL (ref 80.0–100.0)
Platelets: 161 10*3/uL (ref 150–400)
RBC: 2.95 MIL/uL — ABNORMAL LOW (ref 4.22–5.81)
RDW: 19.2 % — ABNORMAL HIGH (ref 11.5–15.5)
WBC: 14 10*3/uL — ABNORMAL HIGH (ref 4.0–10.5)
nRBC: 0 % (ref 0.0–0.2)

## 2019-12-02 LAB — CBC WITH DIFFERENTIAL/PLATELET
Abs Immature Granulocytes: 0.14 10*3/uL — ABNORMAL HIGH (ref 0.00–0.07)
Basophils Absolute: 0 10*3/uL (ref 0.0–0.1)
Basophils Relative: 0 %
Eosinophils Absolute: 0 10*3/uL (ref 0.0–0.5)
Eosinophils Relative: 0 %
HCT: 29.1 % — ABNORMAL LOW (ref 39.0–52.0)
Hemoglobin: 8.8 g/dL — ABNORMAL LOW (ref 13.0–17.0)
Immature Granulocytes: 1 %
Lymphocytes Relative: 3 %
Lymphs Abs: 0.4 10*3/uL — ABNORMAL LOW (ref 0.7–4.0)
MCH: 28.7 pg (ref 26.0–34.0)
MCHC: 30.2 g/dL (ref 30.0–36.0)
MCV: 94.8 fL (ref 80.0–100.0)
Monocytes Absolute: 0.6 10*3/uL (ref 0.1–1.0)
Monocytes Relative: 5 %
Neutro Abs: 12.2 10*3/uL — ABNORMAL HIGH (ref 1.7–7.7)
Neutrophils Relative %: 91 %
Platelets: 197 10*3/uL (ref 150–400)
RBC: 3.07 MIL/uL — ABNORMAL LOW (ref 4.22–5.81)
RDW: 18.6 % — ABNORMAL HIGH (ref 11.5–15.5)
WBC: 13.4 10*3/uL — ABNORMAL HIGH (ref 4.0–10.5)
nRBC: 0 % (ref 0.0–0.2)

## 2019-12-02 LAB — BASIC METABOLIC PANEL
Anion gap: 12 (ref 5–15)
BUN: 42 mg/dL — ABNORMAL HIGH (ref 8–23)
CO2: 34 mmol/L — ABNORMAL HIGH (ref 22–32)
Calcium: 8.5 mg/dL — ABNORMAL LOW (ref 8.9–10.3)
Chloride: 96 mmol/L — ABNORMAL LOW (ref 98–111)
Creatinine, Ser: 2.01 mg/dL — ABNORMAL HIGH (ref 0.61–1.24)
GFR calc Af Amer: 40 mL/min — ABNORMAL LOW (ref >60)
GFR calc non Af Amer: 35 mL/min — ABNORMAL LOW (ref >60)
Glucose, Bld: 145 mg/dL — ABNORMAL HIGH (ref 70–99)
Potassium: 3.4 mmol/L — ABNORMAL LOW (ref 3.5–5.1)
Sodium: 142 mmol/L (ref 135–145)

## 2019-12-02 LAB — BLOOD GAS, ARTERIAL
Acid-Base Excess: 11.9 mmol/L — ABNORMAL HIGH (ref 0.0–2.0)
Bicarbonate: 38.9 mmol/L — ABNORMAL HIGH (ref 20.0–28.0)
Delivery systems: POSITIVE
Expiratory PAP: 6
FIO2: 100
Inspiratory PAP: 12
O2 Saturation: 95.1 %
Patient temperature: 37
pCO2 arterial: 60 mmHg — ABNORMAL HIGH (ref 32.0–48.0)
pH, Arterial: 7.42 (ref 7.350–7.450)
pO2, Arterial: 75 mmHg — ABNORMAL LOW (ref 83.0–108.0)

## 2019-12-02 LAB — HEPARIN LEVEL (UNFRACTIONATED): Heparin Unfractionated: 2.12 IU/mL — ABNORMAL HIGH (ref 0.30–0.70)

## 2019-12-02 LAB — HEPATIC FUNCTION PANEL
ALT: 119 U/L — ABNORMAL HIGH (ref 0–44)
AST: 108 U/L — ABNORMAL HIGH (ref 15–41)
Albumin: 2.4 g/dL — ABNORMAL LOW (ref 3.5–5.0)
Alkaline Phosphatase: 537 U/L — ABNORMAL HIGH (ref 38–126)
Bilirubin, Direct: 1.8 mg/dL — ABNORMAL HIGH (ref 0.0–0.2)
Indirect Bilirubin: 1.5 mg/dL — ABNORMAL HIGH (ref 0.3–0.9)
Total Bilirubin: 3.3 mg/dL — ABNORMAL HIGH (ref 0.3–1.2)
Total Protein: 6.9 g/dL (ref 6.5–8.1)

## 2019-12-02 LAB — SEDIMENTATION RATE: Sed Rate: 129 mm/hr — ABNORMAL HIGH (ref 0–20)

## 2019-12-02 LAB — MYCOPLASMA PNEUMONIAE ANTIBODY, IGM: Mycoplasma pneumo IgM: <770 U/mL (ref 0–769)

## 2019-12-02 LAB — GLUCOSE, CAPILLARY
Glucose-Capillary: 149 mg/dL — ABNORMAL HIGH (ref 70–99)
Glucose-Capillary: 145 mg/dL — ABNORMAL HIGH (ref 70–99)
Glucose-Capillary: 156 mg/dL — ABNORMAL HIGH (ref 70–99)
Glucose-Capillary: 107 mg/dL — ABNORMAL HIGH (ref 70–99)

## 2019-12-02 LAB — BRAIN NATRIURETIC PEPTIDE: B Natriuretic Peptide: 295.5 pg/mL — ABNORMAL HIGH (ref 0.0–100.0)

## 2019-12-02 LAB — BPAM FFP
Blood Product Expiration Date: 202105262359
Blood Product Expiration Date: 202105262359
ISSUE DATE / TIME: 202105211226
ISSUE DATE / TIME: 202105211449
Unit Type and Rh: 7300
Unit Type and Rh: 7300

## 2019-12-02 LAB — APTT: aPTT: 71 s — ABNORMAL HIGH (ref 24–36)

## 2019-12-02 LAB — PROTIME-INR
INR: 1.7 — ABNORMAL HIGH (ref 0.8–1.2)
Prothrombin Time: 18.9 seconds — ABNORMAL HIGH (ref 11.4–15.2)

## 2019-12-02 LAB — AMMONIA: Ammonia: 33 umol/L (ref 9–35)

## 2019-12-02 SURGERY — INTRAVASCULAR ULTRASOUND/IVUS
Anesthesia: Moderate Sedation

## 2019-12-02 MED ORDER — FENTANYL CITRATE (PF) 100 MCG/2ML IJ SOLN
INTRAMUSCULAR | Status: AC | PRN
  Administered 2019-12-02 (×2): 25 ug via INTRAVENOUS

## 2019-12-02 MED ORDER — FENTANYL CITRATE (PF) 100 MCG/2ML IJ SOLN
100.0000 ug | Freq: Once | INTRAMUSCULAR | Status: DC
  Filled 2019-12-02: qty 2

## 2019-12-02 MED ORDER — METOPROLOL TARTRATE 5 MG/5ML IV SOLN
5.0000 mg | Freq: Once | INTRAVENOUS | Status: AC
  Administered 2019-12-02: 5 mg via INTRAVENOUS

## 2019-12-02 MED ORDER — METOPROLOL TARTRATE 5 MG/5ML IV SOLN
INTRAVENOUS | Status: AC
  Filled 2019-12-02: qty 5

## 2019-12-02 MED ORDER — MIDAZOLAM HCL 2 MG/2ML IJ SOLN
INTRAMUSCULAR | Status: AC | PRN
  Administered 2019-12-02: 0.5 mg via INTRAVENOUS

## 2019-12-02 MED ORDER — METOPROLOL TARTRATE 5 MG/5ML IV SOLN
5.0000 mg | Freq: Four times a day (QID) | INTRAVENOUS | Status: DC
  Administered 2019-12-02 – 2019-12-03 (×3): 5 mg via INTRAVENOUS
  Filled 2019-12-02 (×2): qty 5

## 2019-12-02 MED ORDER — FENTANYL CITRATE (PF) 100 MCG/2ML IJ SOLN
INTRAMUSCULAR | Status: AC
  Filled 2019-12-02: qty 2

## 2019-12-02 MED ORDER — HEPARIN (PORCINE) 25000 UT/250ML-% IV SOLN
600.0000 [IU]/h | INTRAVENOUS | Status: DC
  Administered 2019-12-02 (×2): 700 [IU]/h via INTRAVENOUS
  Filled 2019-12-02: qty 250

## 2019-12-02 MED ORDER — SODIUM CHLORIDE 0.9% FLUSH
5.0000 mL | Freq: Three times a day (TID) | INTRAVENOUS | Status: DC
  Administered 2019-12-02 – 2019-12-03 (×3): 5 mL

## 2019-12-02 MED ORDER — MIDAZOLAM HCL 2 MG/2ML IJ SOLN
2.0000 mg | Freq: Once | INTRAMUSCULAR | Status: DC
  Filled 2019-12-02: qty 2

## 2019-12-02 MED ORDER — FUROSEMIDE 10 MG/ML IJ SOLN
40.0000 mg | Freq: Once | INTRAMUSCULAR | Status: AC
  Administered 2019-12-02: 40 mg via INTRAVENOUS
  Filled 2019-12-02: qty 4

## 2019-12-02 MED ORDER — MIDAZOLAM HCL 2 MG/2ML IJ SOLN
INTRAMUSCULAR | Status: AC
  Filled 2019-12-02: qty 2

## 2019-12-02 NOTE — Progress Notes (Signed)
Amiodarone stopped per MD order, pt given metoprolol prior to stopping.

## 2019-12-02 NOTE — Procedures (Signed)
Interventional Radiology Procedure:   Indications: Acute cholecystitis  Procedure: Percutaneous cholecystostomy tube placement  Findings: Distended gallbladder.  Placed 10 Fr drain and removed 115 ml of black bile.  Complications: None     EBL: less than 10 ml  Plan: Send fluid for culture.   Adam R. Anselm Pancoast, MD  Pager: [phone]

## 2019-12-02 NOTE — Consult Note (Signed)
Reason for Consult: Acute hypoxic and hypercarbic respiratory failure on BiPAP, bilateral pulmonary infiltrates Referring Physician: Eppie Gibson, MD  Larry Terrell is an 59 y.o. male.  HPI: This is a very complex 59 year old male, very remote former smoker, with problem list as below, admitted to [facility] on 23 Nov 2019 after a fall in his skilled nursing facility.  Patient is status post recent CABG x5 at [facility] on 26 October 2019.  After CABG he had had respiratory failure and required CCM consultation.  He was eventually weaned off the ventilator postop and had a prolonged course at [facility] finally discharged on 26 April to a skilled nursing facility for rehab.  On evaluation of the emergency room at [facility] the patient was noted to be hypoxic requiring 3 to 4 L/min of oxygen (he had been discharged post CABG on 2 L/min) the patient became more hypoxic and had to eventually been placed on BiPAP.  He was given Lasix and admitted to the stepdown unit.  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  On 20 May he was noted to have abdominal pain and was noted to have acute cholecystitis.  He has now undergone a percutaneous cholecystectomy tube placement today.  He continues to have requirement of BiPAP or high flow O2.  Today he has not been able to tolerate high flow O2 and has required to be on BiPAP.  Chest x-ray bilateral interstitial pulmonary opacities.  The patient has been on piperacillin tazobactam for his cholecystitis.  Of note he has been on amiodarone since his CABG.  I have reviewed the patient's films dating as far back as 2019.  He had a CT angio performed in 2019 that showed some early interstitial changes.  Pulmonary function testing performed on 13 April pre-CABG were consistent with a restrictive physiology.  This is consistent with the findings on the CT scan mentioned above.  The patient is  currently on BiPAP and cannot provide history.  He is somnolent but arousable.  History is from the records available.   Patient Active Problem List   Diagnosis Date Noted  . Grade II diastolic dysfunction 22/44/9753  . Restrictive lung disease 12/02/2019  . Metabolic syndrome 00/51/1021  . Acute cholecystitis   . Stroke (cerebrum) ([place])   . Persistent atrial fibrillation ([place])   . Acute on chronic heart failure with preserved ejection fraction (HFpEF) ([place]) 11/26/2019  . Encephalopathy 11/26/2019  . Acute on chronic combined systolic and diastolic CHF (congestive heart failure) ([place]) 11/30/2019  . Open harvest of left radial artery 10/27/2019  . S/P CABG x 5   . CAD in native artery 10/20/2019  . Unstable angina ([place]) 10/20/2019  . (HFpEF) heart failure with preserved ejection fraction ([place]) 10/20/2019  . Essential hypertension 10/20/2019  . Hyperlipidemia LDL goal <70 10/20/2019  . PAD (peripheral artery disease) ([place]) 10/20/2019  . Chest pain 10/18/2019  . Altered mental status 06/04/2019  . Sepsis ([place]) 06/04/2019  . AKI (acute kidney injury) ([place]) 06/04/2019  . GERD (gastroesophageal reflux disease) 06/04/2019  . History of seizure 06/04/2019  . Stroke ([place]) 04/01/2019  . Acute appendicitis 07/10/2015  . S/P laparoscopic appendectomy 07/10/2015  . Appendicitis, acute   . Diabetes mellitus with complication ([place])   . Chronic congestive heart failure with left ventricular diastolic dysfunction ([place])   . Lactic acidosis   . Acute respiratory failure  with hypoxia [facility])      Past Medical History:  Diagnosis Date  . CHF (congestive heart failure) (HCC)    diastolic (echo at [facility] 9417) EF 55-60%  . Cirrhosis ([place])    NASH per records  . COPD (chronic obstructive pulmonary disease) ([place])   . Depression   . Diabetes mellitus with neuropathy ([place])   . Heart attack ([place])   . Neuropathy   . OSA (obstructive sleep apnea)   . Peripheral vascular disease due to secondary  diabetes mellitus ([place])   . Splenomegaly   . Temporal giant cell arteritis ([place])   . TIA (transient ischemic attack) April 2016    Past Surgical History:  Procedure Laterality Date  . AORTA - FEMORAL ARTERY BYPASS GRAFT Bilateral   . CARDIAC CATHETERIZATION    . cardiac stents  July 2015   . CORONARY ARTERY BYPASS GRAFT N/A 10/26/2019   Procedure: CORONARY ARTERY BYPASS GRAFTING (CABG) x 5, Using Bilateral mammary arteries, and right leg greater saphenous vein harvested endoscopically;  Surgeon: Wonda Olds, MD;  Location: [place];  Service: Open Heart Surgery;  Laterality: N/A;  . CORONARY STENT INTERVENTION N/A 08/09/2017   Procedure: CORONARY STENT INTERVENTION;  Surgeon: Wellington Hampshire, MD;  Location: [place] [facility];  Service: Cardiovascular;  Laterality: N/A;  . LAPAROSCOPIC APPENDECTOMY N/A 07/10/2015   Procedure: APPENDECTOMY LAPAROSCOPIC;  Surgeon: Rolm Bookbinder, MD;  Location: [place];  Service: General;  Laterality: N/A;  . LEFT HEART CATH AND CORONARY ANGIOGRAPHY N/A 08/09/2017   Procedure: LEFT HEART CATH AND CORONARY ANGIOGRAPHY;  Surgeon: Wellington Hampshire, MD;  Location: [place] CV [facility];  Service: Cardiovascular;  Laterality: N/A;  . LEFT HEART CATH AND CORONARY ANGIOGRAPHY N/A 10/19/2019   Procedure: LEFT HEART CATH AND CORONARY ANGIOGRAPHY;  Surgeon: Minna Merritts, MD;  Location: [place] CV [facility];  Service: Cardiovascular;  Laterality: N/A;  . RADIAL ARTERY HARVEST Left 10/26/2019   Procedure: RADIAL ARTERY HARVEST;  Surgeon: Wonda Olds, MD;  Location: [place];  Service: Open Heart Surgery;  Laterality: Left;  . TEE WITHOUT CARDIOVERSION N/A 10/26/2019   Procedure: TRANSESOPHAGEAL ECHOCARDIOGRAM (TEE);  Surgeon: Wonda Olds, MD;  Location: [place];  Service: Open Heart Surgery;  Laterality: N/A;  . TEE WITHOUT CARDIOVERSION N/A 11/27/2019   Procedure: TRANSESOPHAGEAL ECHOCARDIOGRAM (TEE);  Surgeon: Kate Sable, MD;  Location: [facility];   Service: Cardiovascular;  Laterality: N/A;    Family History  Problem Relation Age of Onset  . Heart attack Mother 19  . Stroke Mother   . Heart attack Father   . Alzheimer's disease Father     Social History:  reports that he quit smoking about 12 years ago. His smoking use included cigarettes. He has never used smokeless tobacco. He reports that he does not drink alcohol or use drugs.  Allergies:  Allergies  Allergen Reactions  . Gabapentin   . Ace Inhibitors Cough    Other reaction(s): Other (See Comments) cough    Medications: I have reviewed the patient's current medications.  Results for orders placed or performed during the hospital encounter of 11/26/2019 (from the past 48 hour(s))  Comprehensive metabolic panel     Status: Abnormal   Collection Time: 11/30/19  9:05 PM  Result Value Ref Range   Sodium 142 135 - 145 mmol/L   Potassium 4.3 3.5 - 5.1 mmol/L    Comment: HEMOLYSIS AT THIS LEVEL MAY AFFECT RESULT   Chloride 96 (L) 98 - 111 mmol/L  CO2 32 22 - 32 mmol/L   Glucose, Bld 198 (H) 70 - 99 mg/dL    Comment: Glucose reference range applies only to samples taken after fasting for at least 8 hours.   BUN 33 (H) 8 - 23 mg/dL   Creatinine, Ser 1.15 0.61 - 1.24 mg/dL   Calcium 9.0 8.9 - 10.3 mg/dL   Total Protein 7.0 6.5 - 8.1 g/dL   Albumin 2.6 (L) 3.5 - 5.0 g/dL   AST 504 (H) 15 - 41 U/L   ALT 287 (H) 0 - 44 U/L   Alkaline Phosphatase 761 (H) 38 - 126 U/L   Total Bilirubin 12.5 (H) 0.3 - 1.2 mg/dL   GFR calc non Af Amer >60 >60 mL/min   GFR calc Af Amer >60 >60 mL/min   Anion gap 14 5 - 15    Comment: Performed at [facility], [address], [place], [place] [address]  Magnesium     Status: None   Collection Time: 11/30/19  9:07 PM  Result Value Ref Range   Magnesium 1.9 1.7 - 2.4 mg/dL    Comment: Performed at [facility], [address] [place], [place] [address]  Glucose, capillary     Status: Abnormal   Collection Time: 11/30/19   9:44 PM  Result Value Ref Range   Glucose-Capillary 191 (H) 70 - 99 mg/dL    Comment: Glucose reference range applies only to samples taken after fasting for at least 8 hours.  APTT     Status: Abnormal   Collection Time: 11/30/19 11:09 PM  Result Value Ref Range   aPTT 52 (H) 24 - 36 seconds    Comment:        IF BASELINE aPTT IS ELEVATED, SUGGEST PATIENT RISK ASSESSMENT BE USED TO DETERMINE APPROPRIATE ANTICOAGULANT THERAPY. Performed at [facility], [address] [place], [place] [address]   Protime-INR     Status: Abnormal   Collection Time: 11/30/19 11:09 PM  Result Value Ref Range   Prothrombin Time 23.3 (H) 11.4 - 15.2 seconds   INR 2.2 (H) 0.8 - 1.2    Comment: (NOTE) INR goal varies based on device and disease states. Performed at [facility], [place], [place] [address]   Heparin level (unfractionated)     Status: Abnormal   Collection Time: 11/30/19 11:09 PM  Result Value Ref Range   Heparin Unfractionated 1.72 (H) 0.30 - 0.70 IU/mL    Comment: (NOTE) If heparin results are below expected values, and patient dosage has  been confirmed, suggest follow up testing of antithrombin III levels. Performed at [facility], [address] [place], [place] [address]   Glucose, capillary     Status: Abnormal   Collection Time: 12/01/19 12:53 AM  Result Value Ref Range   Glucose-Capillary 228 (H) 70 - 99 mg/dL    Comment: Glucose reference range applies only to samples taken after fasting for at least 8 hours.  CBC     Status: Abnormal   Collection Time: 12/01/19  6:03 AM  Result Value Ref Range   WBC 25.9 (H) 4.0 - 10.5 K/uL   RBC 3.60 (L) 4.22 - 5.81 MIL/uL   Hemoglobin 10.4 (L) 13.0 - 17.0 g/dL   HCT 34.1 (L) 39.0 - 52.0 %   MCV 94.7 80.0 - 100.0 fL   MCH 28.9 26.0 - 34.0 pg   MCHC 30.5 30.0 - 36.0 g/dL   RDW 19.1 (H) 11.5 - 15.5 %  Platelets 232 150 - 400 K/uL   nRBC 0.0 0.0 - 0.2 %    Comment: Performed at  [facility], [address] [place], [place] [address]  Comprehensive metabolic panel     Status: Abnormal   Collection Time: 12/01/19  6:03 AM  Result Value Ref Range   Sodium 142 135 - 145 mmol/L   Potassium 4.1 3.5 - 5.1 mmol/L   Chloride 96 (L) 98 - 111 mmol/L   CO2 32 22 - 32 mmol/L   Glucose, Bld 233 (H) 70 - 99 mg/dL    Comment: Glucose reference range applies only to samples taken after fasting for at least 8 hours.   BUN 39 (H) 8 - 23 mg/dL   Creatinine, Ser 1.49 (H) 0.61 - 1.24 mg/dL   Calcium 8.8 (L) 8.9 - 10.3 mg/dL   Total Protein 6.9 6.5 - 8.1 g/dL   Albumin 2.5 (L) 3.5 - 5.0 g/dL   AST 311 (H) 15 - 41 U/L   ALT 236 (H) 0 - 44 U/L   Alkaline Phosphatase 740 (H) 38 - 126 U/L   Total Bilirubin 9.6 (H) 0.3 - 1.2 mg/dL   GFR calc non Af Amer 50 (L) >60 mL/min   GFR calc Af Amer 58 (L) >60 mL/min   Anion gap 14 5 - 15    Comment: Performed at [facility], [address] [place], [place] [address]  APTT     Status: Abnormal   Collection Time: 12/01/19  6:03 AM  Result Value Ref Range   aPTT >160 (HH) 24 - 36 seconds    Comment:        IF BASELINE aPTT IS ELEVATED, SUGGEST PATIENT RISK ASSESSMENT BE USED TO DETERMINE APPROPRIATE ANTICOAGULANT THERAPY. CRITICAL RESULT CALLED TO, READ BACK BY AND VERIFIED WITH: Kandis Cocking AT 8563 ON 12/01/2019 [place]. Performed at [facility], [address] [place], [place] [address]   Type and screen [place]     Status: None   Collection Time: 12/01/19  6:03 AM  Result Value Ref Range   ABO/RH(D) B POS    Antibody Screen NEG    Sample Expiration      12/04/2019,2359 Performed at [facility], [address] [place], [place] [address]   Glucose, capillary     Status: Abnormal   Collection Time: 12/01/19  8:28 AM  Result Value Ref Range   Glucose-Capillary 190 (H) 70 - 99 mg/dL    Comment: Glucose reference range applies only to samples taken after fasting  for at least 8 hours.  APTT     Status: Abnormal   Collection Time: 12/01/19  9:17 AM  Result Value Ref Range   aPTT >160 (HH) 24 - 36 seconds    Comment:        IF BASELINE aPTT IS ELEVATED, SUGGEST PATIENT RISK ASSESSMENT BE USED TO DETERMINE APPROPRIATE ANTICOAGULANT THERAPY. CRITICAL RESULT CALLED TO, READ BACK BY AND VERIFIED WITH: DANA DUGGINS AT 5885 ON 12/01/2019 [place]. Performed at [facility], [address] [place], [place] [address]   Prepare fresh frozen plasma     Status: None   Collection Time: 12/01/19 11:10 AM  Result Value Ref Range   Unit Number [id number]    Blood Component Type THAWED PLASMA    Unit division 00    Status of Unit ISSUED,FINAL    Transfusion Status OK TO TRANSFUSE    Unit Number [id number]    Blood Component  Type THAWED PLASMA    Unit division 00    Status of Unit ISSUED,FINAL    Transfusion Status OK TO TRANSFUSE   Glucose, capillary     Status: Abnormal   Collection Time: 12/01/19 11:41 AM  Result Value Ref Range   Glucose-Capillary 208 (H) 70 - 99 mg/dL    Comment: Glucose reference range applies only to samples taken after fasting for at least 8 hours.  Glucose, capillary     Status: Abnormal   Collection Time: 12/01/19  4:12 PM  Result Value Ref Range   Glucose-Capillary 184 (H) 70 - 99 mg/dL    Comment: Glucose reference range applies only to samples taken after fasting for at least 8 hours.  Glucose, capillary     Status: Abnormal   Collection Time: 12/01/19  9:43 PM  Result Value Ref Range   Glucose-Capillary 161 (H) 70 - 99 mg/dL    Comment: Glucose reference range applies only to samples taken after fasting for at least 8 hours.  APTT     Status: Abnormal   Collection Time: 12/01/19 10:18 PM  Result Value Ref Range   aPTT 116 (H) 24 - 36 seconds    Comment:        IF BASELINE aPTT IS ELEVATED, SUGGEST PATIENT RISK ASSESSMENT BE USED TO DETERMINE APPROPRIATE ANTICOAGULANT THERAPY. Performed at [facility], [address] [place], [place] [address]   CBC     Status: Abnormal   Collection Time: 12/02/19  5:08 AM  Result Value Ref Range   WBC 14.0 (H) 4.0 - 10.5 K/uL   RBC 2.95 (L) 4.22 - 5.81 MIL/uL   Hemoglobin 8.6 (L) 13.0 - 17.0 g/dL   HCT 27.9 (L) 39.0 - 52.0 %   MCV 94.6 80.0 - 100.0 fL   MCH 29.2 26.0 - 34.0 pg   MCHC 30.8 30.0 - 36.0 g/dL   RDW 19.2 (H) 11.5 - 15.5 %   Platelets 161 150 - 400 K/uL   nRBC 0.0 0.0 - 0.2 %    Comment: Performed at [facility], [address] [place], [place] [address]  Protime-INR     Status: Abnormal   Collection Time: 12/02/19  5:08 AM  Result Value Ref Range   Prothrombin Time 18.9 (H) 11.4 - 15.2 seconds   INR 1.7 (H) 0.8 - 1.2    Comment: (NOTE) INR goal varies based on device and disease states. Performed at [facility], [address] [place], [place] [address]   APTT     Status: Abnormal   Collection Time: 12/02/19  5:08 AM  Result Value Ref Range   aPTT 71 (H) 24 - 36 seconds    Comment:        IF BASELINE aPTT IS ELEVATED, SUGGEST PATIENT RISK ASSESSMENT BE USED TO DETERMINE APPROPRIATE ANTICOAGULANT THERAPY. Performed at [facility], [address] [place], [place] [address]   Comprehensive metabolic panel     Status: Abnormal   Collection Time: 12/02/19  5:08 AM  Result Value Ref Range   Sodium 143 135 - 145 mmol/L   Potassium 3.5 3.5 - 5.1 mmol/L   Chloride 96 (L) 98 - 111 mmol/L   CO2 34 (H) 22 - 32 mmol/L   Glucose, Bld 169 (H) 70 - 99 mg/dL    Comment: Glucose reference range applies only to samples taken after fasting for at least 8 hours.   BUN 40 (H) 8 - 23 mg/dL   Creatinine,  Ser 1.70 (H) 0.61 - 1.24 mg/dL   Calcium 8.6 (L) 8.9 - 10.3 mg/dL   Total Protein 6.7 6.5 - 8.1 g/dL   Albumin 2.5 (L) 3.5 - 5.0 g/dL   AST 137 (H) 15 - 41 U/L   ALT 142 (H) 0 - 44 U/L   Alkaline Phosphatase 500 (H) 38 - 126 U/L   Total Bilirubin 5.9 (H) 0.3 - 1.2 mg/dL   GFR calc non  Af Amer 43 (L) >60 mL/min   GFR calc Af Amer 49 (L) >60 mL/min   Anion gap 13 5 - 15    Comment: Performed at [facility], [place], [place] [address]  Heparin level (unfractionated)     Status: Abnormal   Collection Time: 12/02/19  5:08 AM  Result Value Ref Range   Heparin Unfractionated 2.12 (H) 0.30 - 0.70 IU/mL    Comment: RESULTS CONFIRMED BY MANUAL DILUTION (NOTE) If heparin results are below expected values, and patient dosage has  been confirmed, suggest follow up testing of antithrombin III levels. Performed at [facility], [place], [place] [address]   Procalcitonin - Baseline     Status: None   Collection Time: 12/02/19  5:08 AM  Result Value Ref Range   Procalcitonin 1.60 ng/mL    Comment:        Interpretation: PCT > 0.5 ng/mL and <= 2 ng/mL: Systemic infection (sepsis) is possible, but other conditions are known to elevate PCT as well. (NOTE)       Sepsis PCT Algorithm           Lower Respiratory Tract                                      Infection PCT Algorithm    ----------------------------     ----------------------------         PCT < 0.25 ng/mL                PCT < 0.10 ng/mL         Strongly encourage             Strongly discourage   discontinuation of antibiotics    initiation of antibiotics    ----------------------------     -----------------------------       PCT 0.25 - 0.50 ng/mL            PCT 0.10 - 0.25 ng/mL               OR       >80% decrease in PCT            Discourage initiation of                                            antibiotics      Encourage discontinuation           of antibiotics    ----------------------------     -----------------------------         PCT >= 0.50 ng/mL              PCT 0.26 - 0.50 ng/mL                AND       <80% decrease in PCT  Encourage initiation of                                             antibiotics       Encourage continuation            of antibiotics    ----------------------------     -----------------------------        PCT >= 0.50 ng/mL                  PCT > 0.50 ng/mL               AND         increase in PCT                  Strongly encourage                                      initiation of antibiotics    Strongly encourage escalation           of antibiotics                                     -----------------------------                                           PCT <= 0.25 ng/mL                                                 OR                                        > 80% decrease in PCT                                     Discontinue / Do not initiate                                             antibiotics Performed at [facility], [address] [place], [place] [address]   Glucose, capillary     Status: Abnormal   Collection Time: 12/02/19  7:50 AM  Result Value Ref Range   Glucose-Capillary 149 (H) 70 - 99 mg/dL    Comment: Glucose reference range applies only to samples taken after fasting for at least 8 hours.  Glucose, capillary     Status: Abnormal   Collection Time: 12/02/19 12:01 PM  Result Value Ref Range   Glucose-Capillary 145 (H) 70 - 99 mg/dL    Comment: Glucose reference range applies only to samples taken after fasting for at least 8 hours.  Glucose, capillary     Status: Abnormal   Collection Time:  12/02/19  4:05 PM  Result Value Ref Range   Glucose-Capillary 156 (H) 70 - 99 mg/dL    Comment: Glucose reference range applies only to samples taken after fasting for at least 8 hours.  Blood gas, arterial     Status: Abnormal   Collection Time: 12/02/19  5:01 PM  Result Value Ref Range   FIO2 100.00    Delivery systems BILEVEL POSITIVE AIRWAY PRESSURE    Inspiratory PAP 12    Expiratory PAP 6    pH, Arterial 7.42 7.350 - 7.450   pCO2 arterial 60 (H) 32.0 - 48.0 mmHg   pO2, Arterial 75 (L) 83.0 - 108.0 mmHg   Bicarbonate 38.9 (H) 20.0 - 28.0 mmol/L   Acid-Base  Excess 11.9 (H) 0.0 - 2.0 mmol/L   O2 Saturation 95.1 %   Patient temperature 37.0    Collection site RIGHT BRACHIAL    Sample type ARTERIAL DRAW    Allens test (pass/fail) PASS PASS    Comment: Performed at [facility], [address] [place], [place] [address]    CT ABDOMEN PELVIS W CONTRAST  Result Date: 11/30/2019 CLINICAL DATA:  Right upper quadrant pain EXAM: CT ABDOMEN AND PELVIS WITH CONTRAST TECHNIQUE: Multidetector CT imaging of the abdomen and pelvis was performed using the standard protocol following bolus administration of intravenous contrast. CONTRAST:  168m OMNIPAQUE IOHEXOL 300 MG/ML  SOLN COMPARISON:  Abdominal ultrasound 11/30/2019 FINDINGS: LOWER CHEST: Intermediate sized bilateral pleural effusions with bibasilar atelectasis. HEPATOBILIARY: Small volume perihepatic ascites. Gallbladder is distended with surrounding inflammatory stranding. There is mild hyperenhancement of the gallbladder fossa. PANCREAS: Normal pancreas. No ductal dilatation or peripancreatic fluid collection. SPLEEN: Areas of hypoattenuation AP due to heterogeneous enhancement, but are more likely due to streak artifacts. ADRENALS/URINARY TRACT: The adrenal glands are normal. No hydronephrosis, nephroureterolithiasis or solid renal mass. The urinary bladder is normal for degree of distention STOMACH/BOWEL: There is no hiatal hernia. Normal duodenal course and caliber. No small bowel dilatation or inflammation. No focal colonic abnormality. Status post appendectomy. 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 There is calcific aortic atherosclerosis. No abdominal or pelvic lymphadenopathy. REPRODUCTIVE: Enlarged prostate measures 5.5 cm in transverse dimension. MUSCULOSKELETAL. Multilevel degenerative disc disease and facet arthrosis. No bony spinal canal stenosis. OTHER: None. IMPRESSION: 1. Acute cholecystitis with distended, inflamed gallbladder and surrounding  inflammatory stranding. 2. Intermediate sized pleural effusions and small volume perihepatic ascites. 3. Aortic Atherosclerosis (ICD10-I70.0). Electronically Signed   By: KUlyses JarredM.D.   On: 11/30/2019 22:39   DG Chest Port 1 View  Result Date: 12/02/2019 CLINICAL DATA:  Acute stroke, tachycardia, leukocytosis, heart failure EXAM: PORTABLE CHEST 1 VIEW COMPARISON:  12/02/2019 FINDINGS: No significant change in AP portable examination with diffuse bilateral interstitial and heterogeneous pulmonary opacity, with possible small layering pleural effusions. No new or focal airspace opacity. Cardiomegaly status post median sternotomy and CABG. IMPRESSION: No significant change in AP portable examination with diffuse bilateral interstitial and heterogeneous pulmonary opacity, with possible small layering pleural effusions, findings consistent with multifocal infection or edema. No new or focal airspace opacity. Electronically Signed   By: AEddie CandleM.D.   On: 12/02/2019 12:33   MR ABDOMEN MRCP W WO CONTAST  Result Date: 12/01/2019 CLINICAL DATA:  Cholecystitis, evaluate CBD EXAM: MRI ABDOMEN WITHOUT AND WITH CONTRAST (INCLUDING MRCP) TECHNIQUE: Multiplanar multisequence MR imaging of the abdomen was performed both before and after the administration of intravenous contrast. Heavily T2-weighted images of the biliary and pancreatic  ducts were obtained, and three-dimensional MRCP images were rendered by post processing. CONTRAST:  43m GADAVIST GADOBUTROL 1 MMOL/ML IV SOLN COMPARISON:  CT abdomen pelvis, 11/30/2019 FINDINGS: Lower chest: Moderate bilateral pleural effusions and associated atelectasis or consolidation of the bilateral lung bases. Cardiomegaly. Hepatobiliary: No mass or other parenchymal abnormality identified. The gallbladder is distended and sludge filled, with gallbladder wall thickening and pericholecystic fluid. There is no biliary ductal dilatation. Central common bile duct is poorly  visualized, likely due to a combination of breath motion artifact and edema. Pancreas: No mass, inflammatory changes, or other parenchymal abnormality identified. Pancreatic duct is nondilated. Spleen:  Within normal limits in size and appearance. Adrenals/Urinary Tract: No masses identified. No evidence of hydronephrosis. Stomach/Bowel: Visualized portions within the abdomen are unremarkable. Vascular/Lymphatic: No pathologically enlarged lymph nodes identified. No abdominal aortic aneurysm demonstrated. Other:  Small volume perihepatic ascites. Musculoskeletal: No suspicious bone lesions identified. IMPRESSION: 1. Distended and sludge filled gallbladder with gallbladder wall thickening and pericholecystic fluid. Findings are consistent with acute cholecystitis and in keeping with prior CT. 2. No biliary ductal dilatation. Central common bile duct is poorly visualized, likely due to a combination of breath motion artifact and edema. No obvious obstructing calculus or other lesion. 3. Small volume perihepatic ascites. 4. Moderate bilateral pleural effusions and associated atelectasis or consolidation of the bilateral lung bases. Electronically Signed   By: AEddie CandleM.D.   On: 12/01/2019 08:11   UKoreaAbdomen Limited RUQ  Result Date: 11/30/2019 CLINICAL DATA:  Pain. EXAM: ULTRASOUND ABDOMEN LIMITED RIGHT UPPER QUADRANT COMPARISON:  Nov 29, 2015 ultrasound. FINDINGS: Gallbladder: The gallbladder is distended. There is borderline gallbladder wall thickening. The sonographic MPercell Millersign is negative. There is gallbladder sludge. Common bile duct: Diameter: 3 mm Liver: The liver is nodular with a coarsened hepatic echotexture. Portal vein is patent on color Doppler imaging with normal direction of blood flow towards the liver. Other: There is a trace amount of ascites. There is a right-sided pleural effusion. IMPRESSION: 1. Distended gallbladder sludge without definite sonographic evidence for acute cholecystitis.  2. Cirrhotic appearing liver. 3. Small volume abdominal ascites. 4. Incidentally noted right-sided pleural effusion. Electronically Signed   By: CConstance HolsterM.D.   On: 11/30/2019 19:02    Review of Systems  Patient unable to provide review of systems due to acute respiratory failure need for BiPAP.  Blood pressure (!) 146/81, pulse (!) 108, temperature 98.7 F (37.1 C), temperature source Axillary, resp. rate 14, height 5' 7"  (1.702 m), weight 84.4 kg, SpO2 97 %. Physical Exam GENERAL: Acute on chronically ill-appearing male on BiPAP.  Pale.  He is sleepy but arousable.  Attempts to interact. HEAD: Normocephalic, atraumatic.  EYES: Pupils equal, round, reactive to Flamenco.  Mild scleral icterus noted. MOUTH: Unable to examine on BiPAP. NECK: Supple. No thyromegaly. Trachea midline. No JVD.  No adenopathy. PULMONARY: Rhonchi anteriorly, faint crackles at the bases. CARDIOVASCULAR: Irregular rate and rhythm, frequent PACs.  Rate low 100s.  No murmurs.  Mid sternotomy dressing in place wound clean. GASTROINTESTINAL: Abdomen nondistended, cholecystectomy drain in the right upper quadrant laterally.  Bowel sounds present but diminished.  Tenderness around drain placement but not elsewhere. MUSCULOSKELETAL: No joint deformity, no clubbing, no edema.  NEUROLOGIC: Lethargic, does awaken and responds.  No overt focal deficit. SKIN: Intact,warm,dry.  Pale. PSYCH: Unable to assess due to critical illness.  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  Recent CABG with median sternotomy Switch patient to AVAPS mode Titrate oxygen for saturations of 90% or better Supportive care High risk for intubation  Bilateral interstitial pulmonary opacities DDx: CHF, ALI/ARDS, acute phase amiodarone toxicity Patient set up for amiodarone toxicity Underlying restrictive lung disease early ILD noted  previously Tendency towards inflammatory disease (temporal arteritis) Hold amiodarone (discussed with cardiology) Beta-blockers for rate control Check BNP, sed rate, CRP, CBC with differential, ANA, rheumatoid factor, ANCA Avoid steroids due to brittle diabetic control Decrease FiO2 as tolerates Pending lab data consider more aggressive diuresis if tolerates High risk for intubation   Atrial fibrillation with rapid ventricular response Combined systolic and diastolic heart failure Beta-blockers for rate control If does not control with standing dose beta-blocker consider esmolol infusion Discontinue amiodarone due to potential toxicity Heparin IV Discussed with cardiology   Acute cholecystitis Status post cholecystectomy drain Continue antibiotics as per ID (piperacillin tazobactam) Biliary fluid culture pending  Encephalopathy Acute, toxic metabolic Supportive care Thiamine  Left PCA stroke, sub-acute This issue adds complexity to his management  Best practice: Prophylaxis: DVT: SCDs, on heparin drip.  GI: On Protonix   Patient is critically ill and at risk for further decompensation and at high risk for intubation.  Will transition from stepdown status to ICU status.  PCCM will assume care.   Critical care time: 60 minutes.   Renold Don, MD [facility] 12/02/2019, 6:28 PM    *This note was dictated using voice recognition software/Dragon.  Despite best efforts to proofread, errors can occur which can change the meaning.  Any change was purely unintentional.

## 2019-12-02 NOTE — Progress Notes (Signed)
[place] for heparin Indication: atrial fibrillation  Allergies  Allergen Reactions  . Gabapentin   . Ace Inhibitors Cough    Other reaction(s): Other (See Comments) cough    Patient Measurements: Height: 5' 7"  (170.2 cm) Weight: 84.4 kg (186 lb 1.1 oz) IBW/kg (Calculated) : 66.1 Heparin Dosing Weight: 73 kg  Vital Signs: Temp: 98.9 F (37.2 C) (05/22 0758) Temp Source: Axillary (05/22 0758) BP: 146/68 (05/22 1100) Pulse Rate: 111 (05/22 1100)  Labs: Recent Labs    12/10/2019 2111 12/08/2019 2111 11/30/19 0241 11/30/19 0241 11/30/19 2105 11/30/19 2309 12/01/19 0603 12/01/19 0603 12/01/19 0917 12/01/19 2218 12/02/19 0508  HGB 11.1*   < > 10.7*   < >  --   --  10.4*  --   --   --  8.6*  HCT 33.5*   < > 32.7*  --   --   --  34.1*  --   --   --  27.9*  PLT 237   < > 216  --   --   --  232  --   --   --  161  APTT  --   --   --    < >  --  52* >160*   < > >160* 116* 71*  LABPROT  --   --   --   --   --  23.3*  --   --   --   --  18.9*  INR  --   --   --   --   --  2.2*  --   --   --   --  1.7*  HEPARINUNFRC  --   --   --   --   --  1.72*  --   --   --   --  2.12*  CREATININE 1.21   < > 0.96   < > 1.15  --  1.49*  --   --   --  1.70*  TROPONINIHS 12  --   --   --   --   --   --   --   --   --   --    < > = values in this interval not displayed.    Estimated Creatinine Clearance: 47.4 mL/min (A) (by C-G formula based on SCr of 1.7 mg/dL (H)).   Medical History: Past Medical History:  Diagnosis Date  . CHF (congestive heart failure) (HCC)    diastolic (echo at [facility] 4010) EF 55-60%  . Cirrhosis ([place])    NASH per records  . COPD (chronic obstructive pulmonary disease) ([place])   . Depression   . Diabetes mellitus with neuropathy ([place])   . Heart attack ([place])   . Neuropathy   . OSA (obstructive sleep apnea)   . Peripheral vascular disease due to secondary diabetes mellitus ([place])   . Splenomegaly   . Temporal giant cell arteritis  ([place])   . TIA (transient ischemic attack) April 2016     Assessment: Patient admitted for fall weakness, w/ h/o CAD s/p PCT, CABG x 5, Cirrhosis s/t NASH, atrial fibrillation on eliquis PTA. Baseline CBC low but stable for patient, aPTT/INR elevated, anti-Xa pending. Patient is being transitioned from eliquis (last dose 05/20 1300) to IV heparin for anticoagulation for atrial fibrillation w/ CHADS-VASc = 3.  5/21 On 5/21 patient was planned to receive IR procedure for cholecystostomy this AM, but INR was elevated ~2.2.  Patient received FFP x  2 doses.  Heparin was held for possible procedure. Procedure rescheduled for 5/22 at 0900.    Goal of Therapy:  APTT 66 - 102 seconds Heparin level 0.3-0.7 units/ml Monitor platelets by anticoagulation protocol: Yes   Plan:  5/22 1500 patient post drain placement with IR. Per Dr Anselm Pancoast, okay to resume. Will restart heparin at 700 units/hr. Plan to check APTT at 2300. CBC and HL with morning labs. Will continue to follow APTT until it correlates with HL.  Tawnya Crook, PharmD Clinical Pharmacist 12/02/2019 3:10 PM

## 2019-12-02 NOTE — Progress Notes (Signed)
Progress Note  Patient Name: Larry Terrell Date of Encounter: 12/02/2019  Primary Cardiologist: Ida Rogue, MD   Subjective   He is sleeping comfortably after having a percutaneous drainage for acute cholecystitis placed this morning.  Has a BiPAP mask on.  Inpatient Medications    Scheduled Meds: .  stroke: mapping our early stages of recovery book   Does not apply Once  . aspirin  81 mg Oral Daily  . atorvastatin  80 mg Oral QPM  . Chlorhexidine Gluconate Cloth  6 each Topical Daily  . ezetimibe  10 mg Oral Daily  . fentaNYL      . fentaNYL (SUBLIMAZE) injection  100 mcg Intravenous Once  . insulin aspart  0-15 Units Subcutaneous TID WC  . insulin aspart  0-5 Units Subcutaneous QHS  . insulin glargine  5 Units Subcutaneous Daily  . mouth rinse  15 mL Mouth Rinse BID  . midazolam      . midazolam  2 mg Intravenous Once  . pantoprazole (PROTONIX) IV  40 mg Intravenous QHS  . pregabalin  50 mg Oral TID  . Ensure Max Protein  11 oz Oral BID  . venlafaxine  75 mg Oral TID   Continuous Infusions: . amiodarone 30 mg/hr (12/02/19 1118)  . levETIRAcetam 500 mg (12/02/19 0454)  . piperacillin-tazobactam 3.375 g (12/02/19 0818)   PRN Meds: acetaminophen **OR** acetaminophen, hydrALAZINE, metoprolol tartrate, morphine injection, naloxone, ondansetron **OR** ondansetron (ZOFRAN) IV, oxyCODONE   Vital Signs    Vitals:   12/02/19 0945 12/02/19 0950 12/02/19 1000 12/02/19 1100  BP: (!) 156/75 (!) 156/75 135/85 (!) 146/68  Pulse: 99 (!) 109 (!) 114 (!) 111  Resp: 16 15 13 12   Temp:      TempSrc:      SpO2: (!) 88% 91% 90% 98%  Weight:      Height:        Intake/Output Summary (Last 24 hours) at 12/02/2019 1134 Last data filed at 12/02/2019 0450 Gross per 24 hour  Intake 2011.55 ml  Output 975 ml  Net 1036.55 ml   Last 3 Weights 12/02/2019 12/01/2019 11/30/2019  Weight (lbs) 186 lb 1.1 oz 181 lb 3.5 oz 181 lb 14.4 oz  Weight (kg) 84.4 kg 82.2 kg 82.509 kg       Telemetry    Atrial fibrillation, heart rate 103- Personally Reviewed  ECG    No new ECG, obtained- Personally Reviewed  Physical Exam   GEN:  BiPAP mask on, sleeping.   Neck: No JVD Cardiac:  Irregular irregular Respiratory:  Rhonchorous breath sounds. GI: Soft, nontender, distended , cholecystostomy tube/bag noted MS: No edema; No deformity. Neuro:  Nonfocal  Psych: Somnolent  Labs    High Sensitivity Troponin:   Recent Labs  Lab 11/18/2019 1208 12/02/2019 1405 12/01/2019 2111  TROPONINIHS 10 10 12       Chemistry Recent Labs  Lab 11/30/19 2105 12/01/19 0603 12/02/19 0508  NA 142 142 143  K 4.3 4.1 3.5  CL 96* 96* 96*  CO2 32 32 34*  GLUCOSE 198* 233* 169*  BUN 33* 39* 40*  CREATININE 1.15 1.49* 1.70*  CALCIUM 9.0 8.8* 8.6*  PROT 7.0 6.9 6.7  ALBUMIN 2.6* 2.5* 2.5*  AST 504* 311* 137*  ALT 287* 236* 142*  ALKPHOS 761* 740* 500*  BILITOT 12.5* 9.6* 5.9*  GFRNONAA >60 50* 43*  GFRAA >60 58* 49*  ANIONGAP 14 14 13      Hematology Recent Labs  Lab 11/30/19 0241 12/01/19  4268 12/02/19 0508  WBC 17.8* 25.9* 14.0*  RBC 3.62* 3.60* 2.95*  HGB 10.7* 10.4* 8.6*  HCT 32.7* 34.1* 27.9*  MCV 90.3 94.7 94.6  MCH 29.6 28.9 29.2  MCHC 32.7 30.5 30.8  RDW 18.9* 19.1* 19.2*  PLT 216 232 161    BNP No results for input(s): BNP, PROBNP in the last 168 hours.   DDimer No results for input(s): DDIMER in the last 168 hours.   Radiology    CT ABDOMEN PELVIS W CONTRAST  Result Date: 11/30/2019 CLINICAL DATA:  Right upper quadrant pain EXAM: CT ABDOMEN AND PELVIS WITH CONTRAST TECHNIQUE: Multidetector CT imaging of the abdomen and pelvis was performed using the standard protocol following bolus administration of intravenous contrast. CONTRAST:  120m OMNIPAQUE IOHEXOL 300 MG/ML  SOLN COMPARISON:  Abdominal ultrasound 11/30/2019 FINDINGS: LOWER CHEST: Intermediate sized bilateral pleural effusions with bibasilar atelectasis. HEPATOBILIARY: Small volume perihepatic  ascites. Gallbladder is distended with surrounding inflammatory stranding. There is mild hyperenhancement of the gallbladder fossa. PANCREAS: Normal pancreas. No ductal dilatation or peripancreatic fluid collection. SPLEEN: Areas of hypoattenuation AP due to heterogeneous enhancement, but are more likely due to streak artifacts. ADRENALS/URINARY TRACT: The adrenal glands are normal. No hydronephrosis, nephroureterolithiasis or solid renal mass. The urinary bladder is normal for degree of distention STOMACH/BOWEL: There is no hiatal hernia. Normal duodenal course and caliber. No small bowel dilatation or inflammation. No focal colonic abnormality. Status post appendectomy. 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 There is calcific aortic atherosclerosis. No abdominal or pelvic lymphadenopathy. REPRODUCTIVE: Enlarged prostate measures 5.5 cm in transverse dimension. MUSCULOSKELETAL. Multilevel degenerative disc disease and facet arthrosis. No bony spinal canal stenosis. OTHER: None. IMPRESSION: 1. Acute cholecystitis with distended, inflamed gallbladder and surrounding inflammatory stranding. 2. Intermediate sized pleural effusions and small volume perihepatic ascites. 3. Aortic Atherosclerosis (ICD10-I70.0). Electronically Signed   By: KUlyses JarredM.D.   On: 11/30/2019 22:39   MR ABDOMEN MRCP W WO CONTAST  Result Date: 12/01/2019 CLINICAL DATA:  Cholecystitis, evaluate CBD EXAM: MRI ABDOMEN WITHOUT AND WITH CONTRAST (INCLUDING MRCP) TECHNIQUE: Multiplanar multisequence MR imaging of the abdomen was performed both before and after the administration of intravenous contrast. Heavily T2-weighted images of the biliary and pancreatic ducts were obtained, and three-dimensional MRCP images were rendered by post processing. CONTRAST:  878mGADAVIST GADOBUTROL 1 MMOL/ML IV SOLN COMPARISON:  CT abdomen pelvis, 11/30/2019 FINDINGS: Lower chest: Moderate bilateral pleural effusions  and associated atelectasis or consolidation of the bilateral lung bases. Cardiomegaly. Hepatobiliary: No mass or other parenchymal abnormality identified. The gallbladder is distended and sludge filled, with gallbladder wall thickening and pericholecystic fluid. There is no biliary ductal dilatation. Central common bile duct is poorly visualized, likely due to a combination of breath motion artifact and edema. Pancreas: No mass, inflammatory changes, or other parenchymal abnormality identified. Pancreatic duct is nondilated. Spleen:  Within normal limits in size and appearance. Adrenals/Urinary Tract: No masses identified. No evidence of hydronephrosis. Stomach/Bowel: Visualized portions within the abdomen are unremarkable. Vascular/Lymphatic: No pathologically enlarged lymph nodes identified. No abdominal aortic aneurysm demonstrated. Other:  Small volume perihepatic ascites. Musculoskeletal: No suspicious bone lesions identified. IMPRESSION: 1. Distended and sludge filled gallbladder with gallbladder wall thickening and pericholecystic fluid. Findings are consistent with acute cholecystitis and in keeping with prior CT. 2. No biliary ductal dilatation. Central common bile duct is poorly visualized, likely due to a combination of breath motion artifact and edema. No obvious obstructing calculus or other lesion. 3. Small volume perihepatic  ascites. 4. Moderate bilateral pleural effusions and associated atelectasis or consolidation of the bilateral lung bases. Electronically Signed   By: Eddie Candle M.D.   On: 12/01/2019 08:11   US Abdomen Limited RUQ  Result Date: 11/30/2019 CLINICAL DATA:  Pain. EXAM: ULTRASOUND ABDOMEN LIMITED RIGHT UPPER QUADRANT COMPARISON:  Nov 29, 2015 ultrasound. FINDINGS: Gallbladder: The gallbladder is distended. There is borderline gallbladder wall thickening. The sonographic Percell Miller sign is negative. There is gallbladder sludge. Common bile duct: Diameter: 3 mm Liver: The liver is  nodular with a coarsened hepatic echotexture. Portal vein is patent on color Doppler imaging with normal direction of blood flow towards the liver. Other: There is a trace amount of ascites. There is a right-sided pleural effusion. IMPRESSION: 1. Distended gallbladder sludge without definite sonographic evidence for acute cholecystitis. 2. Cirrhotic appearing liver. 3. Small volume abdominal ascites. 4. Incidentally noted right-sided pleural effusion. Electronically Signed   By: Constance Holster M.D.   On: 11/30/2019 19:02    Cardiac Studies   Echo 11/27/2019 1. Left ventricular ejection fraction, by estimation, is 55 to 60%. The  left ventricle has normal function. The left ventricle has no regional  wall motion abnormalities. There is mild left ventricular hypertrophy.  Left ventricular diastolic parameters  are indeterminate.  2. Right ventricular systolic function is normal. The right ventricular  size is normal. There is normal pulmonary artery systolic pressure.  3. Left atrial size was moderately dilated.  4. Mild mitral valve regurgitation.  5. Images suggestive of left pleural effusion  Patient Profile     59 y.o. male with history of CAD/CABG, persistent A. fib on Eliquis, heart failure preserved ejection fraction presented with a fall, found to have an acute stroke on MRI.  Hospital course complicated by tachycardia, leukocytosis, jaundice, diagnosed with acute cholecystitis on CT abdomen scan.  Being seen for acute on chronic heart failure preserved ejection fraction, afib.  Assessment & Plan    1. HFpEF -Lasix held due to rising creatinine. -Effusions noted on last chest x-ray -Currently on BiPAP -Continue to monitor creatinine.  2.  Persistent atrial fibrillation -Heart rate reasonably controlled -Restart anticoagulation as soon as possible when safe from a procedural standpoint. Can start with heparin gtt and transition to po/NOAC when able to tolerate  po -Continue amiodarone drip  3.  Altered mental status, acute ischemic stroke -On aspirin, statin -No source of cardiac emboli from TEE  4.  Acute cholecystitis -S/Pcholecystostomy tube -On antibiotics  Total encounter time 35 minutes  Greater than 50% was spent in counseling and coordination of care with the patient    Signed, Kate Sable, MD  12/02/2019, 11:34 AM

## 2019-12-02 NOTE — Progress Notes (Signed)
Patient remains in ICU bed 16, Chole tube placed per Dr Anselm Pancoast at bedside secondarily to instability to travel to VIR for procedure,timeout done prior to procedure with Dr Verdene Lennert Ernestina Patches RN/Becky Frye/Jaiyon Wander Nicki Reaper Rn, noting right patient right procedure. nameband noting right patient. Vitals remained stable throughout procedure. Procedure start time 0918 with end time 0935. Tolerated well. Received Versed 0.29m along with Fentanyl 571m IV for procedure. Monitored patient post procedure for recovery period, remained stable. Report given to Tiffany RN post procedure with questions answered.

## 2019-12-02 NOTE — Progress Notes (Signed)
ANTICOAGULATION CONSULT NOTE - Initial Consult  Pharmacy Consult for heparin Indication: atrial fibrillation  Allergies  Allergen Reactions  . Gabapentin   . Ace Inhibitors Cough    Other reaction(s): Other (See Comments) cough    Patient Measurements: Height: 5' 7"  (170.2 cm) Weight: 82.2 kg (181 lb 3.5 oz) IBW/kg (Calculated) : 66.1 Heparin Dosing Weight: 73 kg  Vital Signs: Temp: 98.5 F (36.9 C) (05/21 2200) Temp Source: Oral (05/21 2200) BP: 134/89 (05/21 2200) Pulse Rate: 92 (05/21 2200)  Labs: Recent Labs    11/26/2019 2111 11/16/2019 2111 11/30/19 0241 11/30/19 2105 11/30/19 2309 11/30/19 2309 12/01/19 0603 12/01/19 0917 12/01/19 2218  HGB 11.1*   < > 10.7*  --   --   --  10.4*  --   --   HCT 33.5*  --  32.7*  --   --   --  34.1*  --   --   PLT 237  --  216  --   --   --  232  --   --   APTT  --   --   --   --  52*   < > >160* >160* 116*  LABPROT  --   --   --   --  23.3*  --   --   --   --   INR  --   --   --   --  2.2*  --   --   --   --   HEPARINUNFRC  --   --   --   --  1.72*  --   --   --   --   CREATININE 1.21   < > 0.96 1.15  --   --  1.49*  --   --   TROPONINIHS 12  --   --   --   --   --   --   --   --    < > = values in this interval not displayed.    Estimated Creatinine Clearance: 53.4 mL/min (A) (by C-G formula based on SCr of 1.49 mg/dL (H)).   Medical History: Past Medical History:  Diagnosis Date  . CHF (congestive heart failure) (HCC)    diastolic (echo at [facility] 0092) EF 55-60%  . Cirrhosis ([place])    NASH per records  . COPD (chronic obstructive pulmonary disease) ([place])   . Depression   . Diabetes mellitus with neuropathy ([place])   . Heart attack ([place])   . Neuropathy   . OSA (obstructive sleep apnea)   . Peripheral vascular disease due to secondary diabetes mellitus ([place])   . Splenomegaly   . Temporal giant cell arteritis ([place])   . TIA (transient ischemic attack) April 2016    Medications:  Scheduled:  .  stroke: mapping our  early stages of recovery book   Does not apply Once  . aspirin  81 mg Oral Daily  . atorvastatin  80 mg Oral QPM  . Chlorhexidine Gluconate Cloth  6 each Topical Daily  . ezetimibe  10 mg Oral Daily  . insulin aspart  0-15 Units Subcutaneous TID WC  . insulin aspart  0-5 Units Subcutaneous QHS  . insulin glargine  5 Units Subcutaneous Daily  . mouth rinse  15 mL Mouth Rinse BID  . pantoprazole (PROTONIX) IV  40 mg Intravenous QHS  . pregabalin  50 mg Oral TID  . Ensure Max Protein  11 oz Oral BID  . venlafaxine  75  mg Oral TID    Assessment: Patient admitted for fall weakness, w/ h/o CAD s/p PCT, CABG x 5, Cirrhosis s/t NASH, atrial fibrillation on eliquis PTA. Baseline CBC low but stable for patient, aPTT/INR elevated, anti-Xa pending. Patient is being transitioned from eliquis (last dose 05/20 1300) to IV heparin for anticoagulation for atrial fibrillation w/ CHADS-VASc = 3.  5/21 On 5/21 patient was planned to receive IR procedure for cholecystostomy this AM, but INR was elevated ~2.2.  Patient received FFP x 2 doses.  Heparin was held for possible procedure, but the procedure has now been rescheduled for 5/22 at 0900.    Goal of Therapy:  APTT 66 - 102 seconds Heparin level 0.3-0.7 units/ml Monitor platelets by anticoagulation protocol: Yes   Plan:  05/21@2218  APTT 116, slightly supratherapeutic. Will decrease rate to 700 units/hr and recheck APTT and HL with AM labs. Per radiology note, will continue heparin until 5/22 at 0630.  Possibly will resume heparin following procedure.  CBC stable for now, will recheck CBC with AM labs.  Pearla Dubonnet, PharmD Clinical Pharmacist 12/02/2019 12:03 AM

## 2019-12-02 NOTE — Progress Notes (Signed)
PROGRESS NOTE    Larry Terrell  YDX:[id number] DOB: 10-21-1960 DOA: 11/22/2019 PCP: [facility]    Assessment & Plan:   Principal Problem:   Acute on chronic heart failure with preserved ejection fraction (HFpEF) (HCC) Active Problems:   Diabetes mellitus with complication (HCC)   CAD in native artery   Essential hypertension   PAD (peripheral artery disease) (HCC)   Encephalopathy   Persistent atrial fibrillation (HCC)   Stroke (cerebrum) (HCC)   Acute on chronic hypoxic respiratory failure: increased oxygen demand today. Currently on BiPAP, wean as tolerated. Secondary to chronic systolic and diastolic CHF.  Echo during hospitalization for CABG last month showed EF 45%. Not on ARB due to renal dysfunction. Per records from last hospital stay and interim office notes, he was discharged after CABG at [facility] on 3L.  Continue on IV lasix today, plan for d/c on 5m BID. Strict I/Os, daily weights. Positive fluid balance today. 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 On admission, patient noted to be confused, no focal deficits noted, but concern for vision changes. Not tPA candidate given on eliquis. MRI obtained due to sudden mental status change shows new left PCA infarct involving the splenium of the corpus callosum, posterior aspect of the left fornix, left occipital lobe, left medial temporal lobe, and body of the left hippocampus. Echo showed no cardiogenic source of embolism. Continue atorvastatin, zetia, aspirin. Hold eliquis & continue on IV heparin as per cardio. Hx of a. fib. TEE 11/13/2019 did not show a thrombus as per cardio   Pneumonia: continue on IV zosyn. Procal elevated again at 1.60.  Encourage incentive spirometry. Continue on supplemental oxygen and wean as tolerated.  ID following and recs apprec   Acute cholecystitis: as per [place] MRCP shows no biliary ductal dilatation. S/p percutaneous cholecystostomy tube placed by  IR 12/02/19. S/p 2 units of FFP given for elevated INR. Not a good surgical candidate at this point as per gen surg. Continue on IV zosyn. ID following and recs apprec. Gen surg following & recs apprec   Transaminitis: likely secondary to acute cholecystitis. Trending down. Will continue to monitor   Hyperbilirubinemia: likely secondary to acute cholecystitis.  Trending down. Management as stated above   Possible cirrhosis: as per [place] Hx of  NASH.   Leukocytosis: secondary to infection. Will continue on IV abxs.   Acute metabolic encephalopathy: superimposed on baseline cognitive impairment.  PT/OT recs SNF.  GERD: continue on famotidine, pantoprazole  CKD stage IIIb: Cr is labile. Will continue to monitor   DM2: continue lantus, SSI w/ accuchecks.   COPD: w/o exacerbation. Continue w/ supportive care  Depression: severity unknown. Continue venlafaxine, pregabalin   PAF: w/ RVR. S/p CABG. Continue on IV amiodarone and IV heparin as per cardio. Hold eliquis   History of seizures: continue keppra   DVT prophylaxis: eliquis Code Status: full  Family Communication: discussed pt's care w/ pt's sister, KMaudie Mercury and answered her questions Disposition Plan: will likely d/c to SNF.  Status is: Inpatient  Remains inpatient appropriate because:IV treatments appropriate due to intensity of illness or inability to take PO   Dispo: The patient is from: Home              Anticipated d/c is to: SNF              Anticipated d/c date is: >3 days  Patient currently is not medically stable to d/c.    Consultants:  Cardio  ID General surg   Procedures: TEE 11/22/2019 no thrombus noted; percutaneous cholecystostomy tube placed by IR 12/02/19   Antimicrobials: zosyn   Subjective: Pt c/o shortness of breath   Objective: Vitals:   12/02/19 0400 12/02/19 0500 12/02/19 0600 12/02/19 0758  BP: 139/88 137/87 139/66   Pulse: 95 (!) 52 (!) 109   Resp: 13 17 15    Temp:     98.9 F (37.2 C)  TempSrc:    Axillary  SpO2: 94% 90% (!) 89%   Weight:  84.4 kg    Height:        Intake/Output Summary (Last 24 hours) at 12/02/2019 0809 Last data filed at 12/02/2019 0450 Gross per 24 hour  Intake 2011.55 ml  Output 975 ml  Net 1036.55 ml   Filed Weights   11/30/19 0300 12/01/19 0100 12/02/19 0500  Weight: 82.5 kg 82.2 kg 84.4 kg    Examination:  General exam: Appears calm but uncomfortable  Respiratory system: course breath sounds b/l.  Cardiovascular system: S1 & S2 +. No rubs, gallops or clicks.. Gastrointestinal system: Abdomen is nondistended, soft and tenderness palpation of RUQ. Hypoactive bowel sounds heard. Central nervous system: Moves all 4 extremities   Psychiatry: Judgement and insight appear abnormal. Flat mood and affect      Data Reviewed: I have personally reviewed following labs and imaging studies  CBC: Recent Labs  Lab 12/02/2019 0434 11/26/2019 2111 11/30/19 0241 12/01/19 0603 12/02/19 0508  WBC 7.6 15.8* 17.8* 25.9* 14.0*  HGB 9.6* 11.1* 10.7* 10.4* 8.6*  HCT 29.1* 33.5* 32.7* 34.1* 27.9*  MCV 91.2 90.1 90.3 94.7 94.6  PLT 177 237 216 232 193   Basic Metabolic Panel: Recent Labs  Lab 11/17/2019 2111 11/30/19 0241 11/30/19 2105 11/30/19 2107 12/01/19 0603 12/02/19 0508  NA 139 141 142  --  142 143  K 4.1 4.2 4.3  --  4.1 3.5  CL 95* 94* 96*  --  96* 96*  CO2 33* 34* 32  --  32 34*  GLUCOSE 216* 225* 198*  --  233* 169*  BUN 31* 31* 33*  --  39* 40*  CREATININE 1.21 0.96 1.15  --  1.49* 1.70*  CALCIUM 9.0 8.8* 9.0  --  8.8* 8.6*  MG  --   --   --  1.9  --   --    GFR: Estimated Creatinine Clearance: 47.4 mL/min (A) (by C-G formula based on SCr of 1.7 mg/dL (H)). Liver Function Tests: Recent Labs  Lab 11/30/19 2105 12/01/19 0603 12/02/19 0508  AST 504* 311* 137*  ALT 287* 236* 142*  ALKPHOS 761* 740* 500*  BILITOT 12.5* 9.6* 5.9*  PROT 7.0 6.9 6.7  ALBUMIN 2.6* 2.5* 2.5*   No results for input(s): LIPASE,  AMYLASE in the last 168 hours. No results for input(s): AMMONIA in the last 168 hours. Coagulation Profile: Recent Labs  Lab 11/30/19 2309 12/02/19 0508  INR 2.2* 1.7*   Cardiac Enzymes: No results for input(s): CKTOTAL, CKMB, CKMBINDEX, TROPONINI in the last 168 hours. BNP (last 3 results) No results for input(s): PROBNP in the last 8760 hours. HbA1C: No results for input(s): HGBA1C in the last 72 hours. CBG: Recent Labs  Lab 12/01/19 0828 12/01/19 1141 12/01/19 1612 12/01/19 2143 12/02/19 0750  GLUCAP 190* 208* 184* 161* 149*   Lipid Profile: No results for input(s): CHOL, HDL, LDLCALC, TRIG, CHOLHDL, LDLDIRECT in the last 72  hours. Thyroid Function Tests: No results for input(s): TSH, T4TOTAL, FREET4, T3FREE, THYROIDAB in the last 72 hours. Anemia Panel: No results for input(s): VITAMINB12, FOLATE, FERRITIN, TIBC, IRON, RETICCTPCT in the last 72 hours. Sepsis Labs: Recent Labs  Lab 12/09/2019 2111 11/22/2019 2327 11/30/19 0241 12/02/19 0508  PROCALCITON 0.12  --   --  1.60  LATICACIDVEN  --  1.1 1.2  --     Recent Results (from the past 240 hour(s))  SARS Coronavirus 2 by RT PCR (hospital order, performed in [facility] hospital lab) Nasopharyngeal Nasopharyngeal Swab     Status: None   Collection Time: 12/06/2019  2:05 PM   Specimen: Nasopharyngeal Swab  Result Value Ref Range Status   SARS Coronavirus 2 NEGATIVE NEGATIVE Final    Comment: (NOTE) SARS-CoV-2 target nucleic acids are NOT DETECTED. The SARS-CoV-2 RNA is generally detectable in upper and lower respiratory specimens during the acute phase of infection. The lowest concentration of SARS-CoV-2 viral copies this assay can detect is 250 copies / mL. A negative result does not preclude SARS-CoV-2 infection and should not be used as the sole basis for treatment or other patient management decisions.  A negative result may occur with improper specimen collection / handling, submission of specimen other than  nasopharyngeal swab, presence of viral mutation(s) within the areas targeted by this assay, and inadequate number of viral copies (<250 copies / mL). A negative result must be combined with clinical observations, patient history, and epidemiological information. Fact Sheet for Patients:   StrictlyIdeas.no Fact Sheet for Healthcare Providers: BankingDealers.co.za This test is not yet approved or cleared  by the [place] FDA and has been authorized for detection and/or diagnosis of SARS-CoV-2 by FDA under an Emergency Use Authorization (EUA).  This EUA will remain in effect (meaning this test can be used) for the duration of the COVID-19 declaration under Section 564(b)(1) of the Act, 21 U.S.C. section 360bbb-3(b)(1), unless the authorization is terminated or revoked sooner. Performed at [facility], [address] [place], [place] [address]   Culture, blood (x 2)     Status: None   Collection Time: 11/27/2019  8:54 PM   Specimen: BLOOD  Result Value Ref Range Status   Specimen Description BLOOD RAC  Final   Special Requests BOTTLES DRAWN AEROBIC AND ANAEROBIC BCAV  Final   Culture   Final    NO GROWTH 5 DAYS Performed at [facility], [address], [place], [place] [address]    Report Status 11/28/2019 FINAL  Final  Culture, blood (x 2)     Status: None   Collection Time: 11/18/2019  8:54 PM   Specimen: BLOOD  Result Value Ref Range Status   Specimen Description BLOOD BRH  Final   Special Requests BOTTLES DRAWN AEROBIC AND ANAEROBIC BCAV  Final   Culture   Final    NO GROWTH 5 DAYS Performed at [facility], [address] [place], [place] [address]    Report Status 11/28/2019 FINAL  Final  SARS CORONAVIRUS 2 (TAT 6-24 HRS) Nasopharyngeal Nasopharyngeal Swab     Status: None   Collection Time: 11/28/19 11:39 AM   Specimen: Nasopharyngeal Swab  Result Value Ref Range Status   SARS Coronavirus 2  NEGATIVE NEGATIVE Final    Comment: (NOTE) SARS-CoV-2 target nucleic acids are NOT DETECTED. The SARS-CoV-2 RNA is generally detectable in upper and lower respiratory specimens during the acute phase of infection. Negative results do not preclude SARS-CoV-2 infection, do not rule  out co-infections with other pathogens, and should not be used as the sole basis for treatment or other patient management decisions. Negative results must be combined with clinical observations, patient history, and epidemiological information. The expected result is Negative. Fact Sheet for Patients: SugarRoll.be Fact Sheet for Healthcare Providers: https://www.woods-mathews.com/ This test is not yet approved or cleared by the [place] FDA and  has been authorized for detection and/or diagnosis of SARS-CoV-2 by FDA under an Emergency Use Authorization (EUA). This EUA will remain  in effect (meaning this test can be used) for the duration of the COVID-19 declaration under Section 56 4(b)(1) of the Act, 21 U.S.C. section 360bbb-3(b)(1), unless the authorization is terminated or revoked sooner. Performed at [facility], [place] [address] [place], [place] [address]   CULTURE, BLOOD (ROUTINE X 2) w Reflex to ID Panel     Status: None (Preliminary result)   Collection Time: 11/14/2019 11:28 PM   Specimen: BLOOD RIGHT HAND  Result Value Ref Range Status   Specimen Description BLOOD RIGHT HAND  Final   Special Requests   Final    BOTTLES DRAWN AEROBIC AND ANAEROBIC Blood Culture adequate volume   Culture   Final    NO GROWTH 3 DAYS Performed at [facility], [address] [place], [place] [address]    Report Status PENDING  Incomplete  CULTURE, BLOOD (ROUTINE X 2) w Reflex to ID Panel     Status: None (Preliminary result)   Collection Time: 11/28/2019 11:38 PM   Specimen: BLOOD LEFT HAND  Result Value Ref Range Status   Specimen Description BLOOD  LEFT HAND  Final   Special Requests   Final    BOTTLES DRAWN AEROBIC AND ANAEROBIC Blood Culture adequate volume   Culture   Final    NO GROWTH 3 DAYS Performed at [facility], [address] [place], [place] [address]    Report Status PENDING  Incomplete  MRSA PCR Screening     Status: None   Collection Time: 11/30/19 11:00 AM   Specimen: Nasal Mucosa; Nasopharyngeal  Result Value Ref Range Status   MRSA by PCR NEGATIVE NEGATIVE Final    Comment:        The GeneXpert MRSA Assay (FDA approved for NASAL specimens only), is one component of a comprehensive MRSA colonization surveillance program. It is not intended to diagnose MRSA infection nor to guide or monitor treatment for MRSA infections. Performed at [facility], [address] [place],  [address]          Radiology Studies: CT ABDOMEN PELVIS W CONTRAST  Result Date: 11/30/2019 CLINICAL DATA:  Right upper quadrant pain EXAM: CT ABDOMEN AND PELVIS WITH CONTRAST TECHNIQUE: Multidetector CT imaging of the abdomen and pelvis was performed using the standard protocol following bolus administration of intravenous contrast. CONTRAST:  154m OMNIPAQUE IOHEXOL 300 MG/ML  SOLN COMPARISON:  Abdominal ultrasound 11/30/2019 FINDINGS: LOWER CHEST: Intermediate sized bilateral pleural effusions with bibasilar atelectasis. HEPATOBILIARY: Small volume perihepatic ascites. Gallbladder is distended with surrounding inflammatory stranding. There is mild hyperenhancement of the gallbladder fossa. PANCREAS: Normal pancreas. No ductal dilatation or peripancreatic fluid collection. SPLEEN: Areas of hypoattenuation AP due to heterogeneous enhancement, but are more likely due to streak artifacts. ADRENALS/URINARY TRACT: The adrenal glands are normal. No hydronephrosis, nephroureterolithiasis or solid renal mass. The urinary bladder is normal for degree of distention STOMACH/BOWEL: There is no hiatal hernia. Normal duodenal  course and caliber. No small bowel dilatation or inflammation. No  focal colonic abnormality. Status post appendectomy. 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 There is calcific aortic atherosclerosis. No abdominal or pelvic lymphadenopathy. REPRODUCTIVE: Enlarged prostate measures 5.5 cm in transverse dimension. MUSCULOSKELETAL. Multilevel degenerative disc disease and facet arthrosis. No bony spinal canal stenosis. OTHER: None. IMPRESSION: 1. Acute cholecystitis with distended, inflamed gallbladder and surrounding inflammatory stranding. 2. Intermediate sized pleural effusions and small volume perihepatic ascites. 3. Aortic Atherosclerosis (ICD10-I70.0). Electronically Signed   By: Ulyses Jarred M.D.   On: 11/30/2019 22:39   MR ABDOMEN MRCP W WO CONTAST  Result Date: 12/01/2019 CLINICAL DATA:  Cholecystitis, evaluate CBD EXAM: MRI ABDOMEN WITHOUT AND WITH CONTRAST (INCLUDING MRCP) TECHNIQUE: Multiplanar multisequence MR imaging of the abdomen was performed both before and after the administration of intravenous contrast. Heavily T2-weighted images of the biliary and pancreatic ducts were obtained, and three-dimensional MRCP images were rendered by post processing. CONTRAST:  21m GADAVIST GADOBUTROL 1 MMOL/ML IV SOLN COMPARISON:  CT abdomen pelvis, 11/30/2019 FINDINGS: Lower chest: Moderate bilateral pleural effusions and associated atelectasis or consolidation of the bilateral lung bases. Cardiomegaly. Hepatobiliary: No mass or other parenchymal abnormality identified. The gallbladder is distended and sludge filled, with gallbladder wall thickening and pericholecystic fluid. There is no biliary ductal dilatation. Central common bile duct is poorly visualized, likely due to a combination of breath motion artifact and edema. Pancreas: No mass, inflammatory changes, or other parenchymal abnormality identified. Pancreatic duct is nondilated. Spleen:  Within normal limits  in size and appearance. Adrenals/Urinary Tract: No masses identified. No evidence of hydronephrosis. Stomach/Bowel: Visualized portions within the abdomen are unremarkable. Vascular/Lymphatic: No pathologically enlarged lymph nodes identified. No abdominal aortic aneurysm demonstrated. Other:  Small volume perihepatic ascites. Musculoskeletal: No suspicious bone lesions identified. IMPRESSION: 1. Distended and sludge filled gallbladder with gallbladder wall thickening and pericholecystic fluid. Findings are consistent with acute cholecystitis and in keeping with prior CT. 2. No biliary ductal dilatation. Central common bile duct is poorly visualized, likely due to a combination of breath motion artifact and edema. No obvious obstructing calculus or other lesion. 3. Small volume perihepatic ascites. 4. Moderate bilateral pleural effusions and associated atelectasis or consolidation of the bilateral lung bases. Electronically Signed   By: AEddie CandleM.D.   On: 12/01/2019 08:11   [place] RUQ  Result Date: 11/30/2019 CLINICAL DATA:  Pain. EXAM: ULTRASOUND ABDOMEN LIMITED RIGHT UPPER QUADRANT COMPARISON:  Nov 29, 2015 ultrasound. FINDINGS: Gallbladder: The gallbladder is distended. There is borderline gallbladder wall thickening. The sonographic MPercell Millersign is negative. There is gallbladder sludge. Common bile duct: Diameter: 3 mm Liver: The liver is nodular with a coarsened hepatic echotexture. Portal vein is patent on color Doppler imaging with normal direction of blood flow towards the liver. Other: There is a trace amount of ascites. There is a right-sided pleural effusion. IMPRESSION: 1. Distended gallbladder sludge without definite sonographic evidence for acute cholecystitis. 2. Cirrhotic appearing liver. 3. Small volume abdominal ascites. 4. Incidentally noted right-sided pleural effusion. Electronically Signed   By: CConstance HolsterM.D.   On: 11/30/2019 19:02        Scheduled Meds: .   stroke: mapping our early stages of recovery book   Does not apply Once  . aspirin  81 mg Oral Daily  . atorvastatin  80 mg Oral QPM  . Chlorhexidine Gluconate Cloth  6 each Topical Daily  . ezetimibe  10 mg Oral Daily  . insulin aspart  0-15 Units Subcutaneous TID WC  . insulin  aspart  0-5 Units Subcutaneous QHS  . insulin glargine  5 Units Subcutaneous Daily  . mouth rinse  15 mL Mouth Rinse BID  . pantoprazole (PROTONIX) IV  40 mg Intravenous QHS  . pregabalin  50 mg Oral TID  . Ensure Max Protein  11 oz Oral BID  . venlafaxine  75 mg Oral TID   Continuous Infusions: . amiodarone 30 mg/hr (12/02/19 0450)  . levETIRAcetam 500 mg (12/02/19 0454)  . piperacillin-tazobactam Stopped (12/02/19 0329)     LOS: 9 days    Time spent: 32 mins    Wyvonnia Dusky, MD [facility] Pager [phone]  If 7PM-7AM, please contact night-coverage [email or web address] 12/02/2019, 8:09 AM

## 2019-12-02 NOTE — Progress Notes (Signed)
Tried patient on heated high flow at 100%. Did not do well, desated to 85%, had to place back on bipap at 100%. Sats 94% at this time. RN notified.

## 2019-12-02 NOTE — Progress Notes (Signed)
CC: Acute cholecystitis Subjective: Patient is somnolent on BiPAP.  He just had his cholecystostomy tube I discussed with Dr. Halford Decamp in detail.  There is significant inflammatory changes but we were able to place a cholecystostomy tube.  There was also some ascites.  He continues on BiPAP. He has been delirious White count has significantly improved as well as bilirubin now bilirubin of 5.9.  Alkaline phosphatase improving and now down to 500. I personally reviewed the CT scan as well as the MRCP and ultrasound.  There is evidence of cholecystitis without choledocholithiasis   Objective: Vital signs in last 24 hours: Temp:  [97.6 F (36.4 C)-98.9 F (37.2 C)] 98.9 F (37.2 C) (05/22 0758) Pulse Rate:  [32-114] 99 (05/22 0945) Resp:  [11-22] 16 (05/22 0945) BP: (131-181)/(59-111) 156/75 (05/22 0945) SpO2:  [87 %-97 %] 88 % (05/22 0945) FiO2 (%):  [100 %] 100 % (05/22 0900) Weight:  [84.4 kg] 84.4 kg (05/22 0500) Last BM Date: (PTA)  Intake/Output from previous day: 05/21 0701 - 05/22 0700 In: 2011.6 [I.V.:492.4; Blood:1020; IV Piggyback:499.1] Out: 975 [Urine:975] Intake/Output this shift: No intake/output data recorded.  Physical exam: Patient somnolent after having some conscious sedation but he is delirious at baseline at least for the last 24 hours.   Abd: soft, no peritonitis but given his mentation it is difficult to assess.  Cholecystostomy tube in place.   Lab Results: CBC  Recent Labs    12/01/19 0603 12/02/19 0508  WBC 25.9* 14.0*  HGB 10.4* 8.6*  HCT 34.1* 27.9*  PLT 232 161   BMET Recent Labs    12/01/19 0603 12/02/19 0508  NA 142 143  K 4.1 3.5  CL 96* 96*  CO2 32 34*  GLUCOSE 233* 169*  BUN 39* 40*  CREATININE 1.49* 1.70*  CALCIUM 8.8* 8.6*   PT/INR Recent Labs    11/30/19 2309 12/02/19 0508  LABPROT 23.3* 18.9*  INR 2.2* 1.7*   ABG Recent Labs    11/28/2019 2043  PHART 7.44  HCO3 36.0*    Studies/Results: CT ABDOMEN PELVIS W  CONTRAST  Result Date: 11/30/2019 CLINICAL DATA:  Right upper quadrant pain EXAM: CT ABDOMEN AND PELVIS WITH CONTRAST TECHNIQUE: Multidetector CT imaging of the abdomen and pelvis was performed using the standard protocol following bolus administration of intravenous contrast. CONTRAST:  159m OMNIPAQUE IOHEXOL 300 MG/ML  SOLN COMPARISON:  Abdominal ultrasound 11/30/2019 FINDINGS: LOWER CHEST: Intermediate sized bilateral pleural effusions with bibasilar atelectasis. HEPATOBILIARY: Small volume perihepatic ascites. Gallbladder is distended with surrounding inflammatory stranding. There is mild hyperenhancement of the gallbladder fossa. PANCREAS: Normal pancreas. No ductal dilatation or peripancreatic fluid collection. SPLEEN: Areas of hypoattenuation AP due to heterogeneous enhancement, but are more likely due to streak artifacts. ADRENALS/URINARY TRACT: The adrenal glands are normal. No hydronephrosis, nephroureterolithiasis or solid renal mass. The urinary bladder is normal for degree of distention STOMACH/BOWEL: There is no hiatal hernia. Normal duodenal course and caliber. No small bowel dilatation or inflammation. No focal colonic abnormality. Status post appendectomy. 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 There is calcific aortic atherosclerosis. No abdominal or pelvic lymphadenopathy. REPRODUCTIVE: Enlarged prostate measures 5.5 cm in transverse dimension. MUSCULOSKELETAL. Multilevel degenerative disc disease and facet arthrosis. No bony spinal canal stenosis. OTHER: None. IMPRESSION: 1. Acute cholecystitis with distended, inflamed gallbladder and surrounding inflammatory stranding. 2. Intermediate sized pleural effusions and small volume perihepatic ascites. 3. Aortic Atherosclerosis (ICD10-I70.0). Electronically Signed   By: KUlyses JarredM.D.   On: 11/30/2019 22:39  MR ABDOMEN MRCP W WO CONTAST  Result Date: 12/01/2019 CLINICAL DATA:  Cholecystitis,  evaluate CBD EXAM: MRI ABDOMEN WITHOUT AND WITH CONTRAST (INCLUDING MRCP) TECHNIQUE: Multiplanar multisequence MR imaging of the abdomen was performed both before and after the administration of intravenous contrast. Heavily T2-weighted images of the biliary and pancreatic ducts were obtained, and three-dimensional MRCP images were rendered by post processing. CONTRAST:  48m GADAVIST GADOBUTROL 1 MMOL/ML IV SOLN COMPARISON:  CT abdomen pelvis, 11/30/2019 FINDINGS: Lower chest: Moderate bilateral pleural effusions and associated atelectasis or consolidation of the bilateral lung bases. Cardiomegaly. Hepatobiliary: No mass or other parenchymal abnormality identified. The gallbladder is distended and sludge filled, with gallbladder wall thickening and pericholecystic fluid. There is no biliary ductal dilatation. Central common bile duct is poorly visualized, likely due to a combination of breath motion artifact and edema. Pancreas: No mass, inflammatory changes, or other parenchymal abnormality identified. Pancreatic duct is nondilated. Spleen:  Within normal limits in size and appearance. Adrenals/Urinary Tract: No masses identified. No evidence of hydronephrosis. Stomach/Bowel: Visualized portions within the abdomen are unremarkable. Vascular/Lymphatic: No pathologically enlarged lymph nodes identified. No abdominal aortic aneurysm demonstrated. Other:  Small volume perihepatic ascites. Musculoskeletal: No suspicious bone lesions identified. IMPRESSION: 1. Distended and sludge filled gallbladder with gallbladder wall thickening and pericholecystic fluid. Findings are consistent with acute cholecystitis and in keeping with prior CT. 2. No biliary ductal dilatation. Central common bile duct is poorly visualized, likely due to a combination of breath motion artifact and edema. No obvious obstructing calculus or other lesion. 3. Small volume perihepatic ascites. 4. Moderate bilateral pleural effusions and associated  atelectasis or consolidation of the bilateral lung bases. Electronically Signed   By: AEddie CandleM.D.   On: 12/01/2019 08:11   UKoreaAbdomen Limited RUQ  Result Date: 11/30/2019 CLINICAL DATA:  Pain. EXAM: ULTRASOUND ABDOMEN LIMITED RIGHT UPPER QUADRANT COMPARISON:  Nov 29, 2015 ultrasound. FINDINGS: Gallbladder: The gallbladder is distended. There is borderline gallbladder wall thickening. The sonographic MPercell Millersign is negative. There is gallbladder sludge. Common bile duct: Diameter: 3 mm Liver: The liver is nodular with a coarsened hepatic echotexture. Portal vein is patent on color Doppler imaging with normal direction of blood flow towards the liver. Other: There is a trace amount of ascites. There is a right-sided pleural effusion. IMPRESSION: 1. Distended gallbladder sludge without definite sonographic evidence for acute cholecystitis. 2. Cirrhotic appearing liver. 3. Small volume abdominal ascites. 4. Incidentally noted right-sided pleural effusion. Electronically Signed   By: CConstance HolsterM.D.   On: 11/30/2019 19:02    Anti-infectives: Anti-infectives (From admission, onward)   Start     Dose/Rate Route Frequency Ordered Stop   12/01/19 0000  piperacillin-tazobactam (ZOSYN) IVPB 3.375 g     3.375 g 12.5 mL/hr over 240 Minutes Intravenous Every 8 hours 11/30/19 2105     11/30/19 1100  vancomycin (VANCOCIN) IVPB 1000 mg/200 mL premix  Status:  Discontinued     1,000 mg 200 mL/hr over 60 Minutes Intravenous Every 12 hours 11/30/19 0303 11/30/19 1314   11/30/19 1000  ceFEPIme (MAXIPIME) 2 g in sodium chloride 0.9 % 100 mL IVPB  Status:  Discontinued     2 g 200 mL/hr over 30 Minutes Intravenous Every 8 hours 11/30/19 0259 11/30/19 2105   11/30/19 0115  vancomycin (VANCOREADY) IVPB 1750 mg/350 mL     1,750 mg 175 mL/hr over 120 Minutes Intravenous  Once 11/30/19 0105 11/30/19 0415   12/07/2019 2230  ceFEPIme (MAXIPIME) 1 g in sodium  chloride 0.9 % 100 mL IVPB  Status:  Discontinued      1 g 200 mL/hr over 30 Minutes Intravenous Every 8 hours 11/15/2019 2218 11/30/19 0259   11/11/2019 2000  cefTRIAXone (ROCEPHIN) 2 g in sodium chloride 0.9 % 100 mL IVPB  Status:  Discontinued     2 g 200 mL/hr over 30 Minutes Intravenous Every 24 hours 11/30/2019 1953 11/24/19 0809   12/11/2019 2000  azithromycin (ZITHROMAX) 500 mg in sodium chloride 0.9 % 250 mL IVPB  Status:  Discontinued     500 mg 250 mL/hr over 60 Minutes Intravenous Every 24 hours 11/11/2019 1953 11/24/19 0809      Assessment/Plan:  Acute cholecystitis in a critical ill individual now with respiratory failure on BiPAP.  Recent CABG. guarded prognosis. Not a surgical candidate at this time.  Hopefully with cholecystostomy tube things will continue to improve and we will have source control from his sepsis. Please note that I spent more than 35 minutes in this encounter with greater than 50% spent in coordination and counseling of his care   Caroleen Hamman, MD, [facility]  12/02/2019

## 2019-12-03 ENCOUNTER — Inpatient Hospital Stay: Payer: Medicare HMO

## 2019-12-03 ENCOUNTER — Encounter: Payer: Self-pay | Admitting: Family Medicine

## 2019-12-03 DIAGNOSIS — R7401 Elevation of levels of liver transaminase levels: Secondary | ICD-10-CM

## 2019-12-03 DIAGNOSIS — I509 Heart failure, unspecified: Secondary | ICD-10-CM

## 2019-12-03 DIAGNOSIS — A419 Sepsis, unspecified organism: Secondary | ICD-10-CM

## 2019-12-03 DIAGNOSIS — J9600 Acute respiratory failure, unspecified whether with hypoxia or hypercapnia: Secondary | ICD-10-CM

## 2019-12-03 DIAGNOSIS — J80 Acute respiratory distress syndrome: Secondary | ICD-10-CM

## 2019-12-03 DIAGNOSIS — R6520 Severe sepsis without septic shock: Secondary | ICD-10-CM

## 2019-12-03 LAB — BLOOD GAS, ARTERIAL
Acid-Base Excess: 9.4 mmol/L — ABNORMAL HIGH (ref 0.0–2.0)
Acid-Base Excess: 2.8 mmol/L — ABNORMAL HIGH (ref 0.0–2.0)
Acid-base deficit: 1 mmol/L (ref 0.0–2.0)
Bicarbonate: 35.3 mmol/L — ABNORMAL HIGH (ref 20.0–28.0)
Bicarbonate: 30.1 mmol/L — ABNORMAL HIGH (ref 20.0–28.0)
Bicarbonate: 27.6 mmol/L (ref 20.0–28.0)
Delivery systems: POSITIVE
FIO2: 1
FIO2: 1
FIO2: 1
MECHVT: 400 mL
O2 Saturation: 95.2 %
O2 Saturation: 97.6 %
O2 Saturation: 80 %
PEEP: 12 cmH2O
Patient temperature: 37
Patient temperature: 37
Patient temperature: 37
RATE: 28 resp/min
pCO2 arterial: 52 mmHg — ABNORMAL HIGH (ref 32.0–48.0)
pCO2 arterial: 57 mmHg — ABNORMAL HIGH (ref 32.0–48.0)
pCO2 arterial: 63 mmHg — ABNORMAL HIGH (ref 32.0–48.0)
pH, Arterial: 7.44 (ref 7.350–7.450)
pH, Arterial: 7.33 — ABNORMAL LOW (ref 7.350–7.450)
pH, Arterial: 7.25 — ABNORMAL LOW (ref 7.350–7.450)
pO2, Arterial: 74 mmHg — ABNORMAL LOW (ref 83.0–108.0)
pO2, Arterial: 104 mmHg (ref 83.0–108.0)
pO2, Arterial: 52 mmHg — ABNORMAL LOW (ref 83.0–108.0)

## 2019-12-03 LAB — CBC WITH DIFFERENTIAL/PLATELET
Abs Immature Granulocytes: 1.64 10*3/uL — ABNORMAL HIGH (ref 0.00–0.07)
Basophils Absolute: 0.1 10*3/uL (ref 0.0–0.1)
Basophils Relative: 0 %
Eosinophils Absolute: 0 10*3/uL (ref 0.0–0.5)
Eosinophils Relative: 0 %
HCT: 30.9 % — ABNORMAL LOW (ref 39.0–52.0)
Hemoglobin: 8.7 g/dL — ABNORMAL LOW (ref 13.0–17.0)
Immature Granulocytes: 8 %
Lymphocytes Relative: 16 %
Lymphs Abs: 3.6 10*3/uL (ref 0.7–4.0)
MCH: 29 pg (ref 26.0–34.0)
MCHC: 28.2 g/dL — ABNORMAL LOW (ref 30.0–36.0)
MCV: 103 fL — ABNORMAL HIGH (ref 80.0–100.0)
Monocytes Absolute: 1.1 10*3/uL — ABNORMAL HIGH (ref 0.1–1.0)
Monocytes Relative: 5 %
Neutro Abs: 15.5 10*3/uL — ABNORMAL HIGH (ref 1.7–7.7)
Neutrophils Relative %: 71 %
Platelets: 237 10*3/uL (ref 150–400)
RBC: 3 MIL/uL — ABNORMAL LOW (ref 4.22–5.81)
RDW: 18.7 % — ABNORMAL HIGH (ref 11.5–15.5)
Smear Review: NORMAL
WBC: 22 10*3/uL — ABNORMAL HIGH (ref 4.0–10.5)
nRBC: 0.8 % — ABNORMAL HIGH (ref 0.0–0.2)

## 2019-12-03 LAB — BASIC METABOLIC PANEL
Anion gap: 15 (ref 5–15)
BUN: 51 mg/dL — ABNORMAL HIGH (ref 8–23)
CO2: 29 mmol/L (ref 22–32)
Calcium: 8 mg/dL — ABNORMAL LOW (ref 8.9–10.3)
Chloride: 103 mmol/L (ref 98–111)
Creatinine, Ser: 2.19 mg/dL — ABNORMAL HIGH (ref 0.61–1.24)
GFR calc Af Amer: 36 mL/min — ABNORMAL LOW (ref >60)
GFR calc non Af Amer: 31 mL/min — ABNORMAL LOW (ref >60)
Glucose, Bld: 174 mg/dL — ABNORMAL HIGH (ref 70–99)
Potassium: 4 mmol/L (ref 3.5–5.1)
Sodium: 147 mmol/L — ABNORMAL HIGH (ref 135–145)

## 2019-12-03 LAB — CBC
HCT: 28.9 % — ABNORMAL LOW (ref 39.0–52.0)
Hemoglobin: 8.8 g/dL — ABNORMAL LOW (ref 13.0–17.0)
MCH: 29.6 pg (ref 26.0–34.0)
MCHC: 30.4 g/dL (ref 30.0–36.0)
MCV: 97.3 fL (ref 80.0–100.0)
Platelets: 187 10*3/uL (ref 150–400)
RBC: 2.97 MIL/uL — ABNORMAL LOW (ref 4.22–5.81)
RDW: 18.5 % — ABNORMAL HIGH (ref 11.5–15.5)
WBC: 14 10*3/uL — ABNORMAL HIGH (ref 4.0–10.5)
nRBC: 0.1 % (ref 0.0–0.2)

## 2019-12-03 LAB — APTT
aPTT: 116 seconds — ABNORMAL HIGH (ref 24–36)
aPTT: 155 seconds — ABNORMAL HIGH (ref 24–36)
aPTT: 73 seconds — ABNORMAL HIGH (ref 24–36)

## 2019-12-03 LAB — COMPREHENSIVE METABOLIC PANEL
ALT: 102 U/L — ABNORMAL HIGH (ref 0–44)
AST: 95 U/L — ABNORMAL HIGH (ref 15–41)
Albumin: 2.5 g/dL — ABNORMAL LOW (ref 3.5–5.0)
Alkaline Phosphatase: 494 U/L — ABNORMAL HIGH (ref 38–126)
Anion gap: 16 — ABNORMAL HIGH (ref 5–15)
BUN: 46 mg/dL — ABNORMAL HIGH (ref 8–23)
CO2: 32 mmol/L (ref 22–32)
Calcium: 8.5 mg/dL — ABNORMAL LOW (ref 8.9–10.3)
Chloride: 97 mmol/L — ABNORMAL LOW (ref 98–111)
Creatinine, Ser: 1.97 mg/dL — ABNORMAL HIGH (ref 0.61–1.24)
GFR calc Af Amer: 41 mL/min — ABNORMAL LOW (ref >60)
GFR calc non Af Amer: 36 mL/min — ABNORMAL LOW (ref >60)
Glucose, Bld: 123 mg/dL — ABNORMAL HIGH (ref 70–99)
Potassium: 3.4 mmol/L — ABNORMAL LOW (ref 3.5–5.1)
Sodium: 145 mmol/L (ref 135–145)
Total Bilirubin: 3.1 mg/dL — ABNORMAL HIGH (ref 0.3–1.2)
Total Protein: 7.1 g/dL (ref 6.5–8.1)

## 2019-12-03 LAB — C-REACTIVE PROTEIN: CRP: 31.2 mg/dL — ABNORMAL HIGH (ref <1.0)

## 2019-12-03 LAB — GLUCOSE, CAPILLARY
Glucose-Capillary: 156 mg/dL — ABNORMAL HIGH (ref 70–99)
Glucose-Capillary: 127 mg/dL — ABNORMAL HIGH (ref 70–99)

## 2019-12-03 LAB — PHOSPHORUS: Phosphorus: 3.4 mg/dL (ref 2.5–4.6)

## 2019-12-03 LAB — HEPARIN LEVEL (UNFRACTIONATED): Heparin Unfractionated: 3.08 IU/mL — ABNORMAL HIGH (ref 0.30–0.70)

## 2019-12-03 LAB — MAGNESIUM: Magnesium: 2.4 mg/dL (ref 1.7–2.4)

## 2019-12-03 LAB — PROTIME-INR
INR: 2.1 — ABNORMAL HIGH (ref 0.8–1.2)
Prothrombin Time: 22.4 seconds — ABNORMAL HIGH (ref 11.4–15.2)

## 2019-12-03 LAB — TRIGLYCERIDES: Triglycerides: 76 mg/dL (ref <150)

## 2019-12-03 LAB — TROPONIN I (HIGH SENSITIVITY): Troponin I (High Sensitivity): 56 ng/L — ABNORMAL HIGH (ref <18)

## 2019-12-03 MED ORDER — NOREPINEPHRINE 4 MG/250ML-% IV SOLN: 0.0000 ug/min | INTRAVENOUS | Status: DC

## 2019-12-03 MED ORDER — POTASSIUM CHLORIDE 10 MEQ/100ML IV SOLN
10.0000 meq | INTRAVENOUS | Status: AC
  Administered 2019-12-03 (×2): 10 meq via INTRAVENOUS
  Filled 2019-12-03 (×2): qty 100

## 2019-12-03 MED ORDER — FUROSEMIDE 10 MG/ML IJ SOLN
40.0000 mg | Freq: Once | INTRAMUSCULAR | Status: AC
  Administered 2019-12-03: 40 mg via INTRAVENOUS
  Filled 2019-12-03: qty 4

## 2019-12-03 MED ORDER — FREE WATER: 200.0000 mL | Status: DC

## 2019-12-03 MED ORDER — ETOMIDATE 2 MG/ML IV SOLN: 20.0000 mg | Freq: Once | INTRAVENOUS | Status: AC

## 2019-12-03 MED ORDER — FENTANYL CITRATE (PF) 100 MCG/2ML IJ SOLN
100.0000 ug | Freq: Once | INTRAMUSCULAR | Status: DC
  Administered 2019-12-03: 100 ug via INTRAVENOUS

## 2019-12-03 MED ORDER — STERILE WATER FOR INJECTION IV SOLN
INTRAVENOUS | Status: DC
  Filled 2019-12-03 (×3): qty 850

## 2019-12-03 MED ORDER — METHYLPREDNISOLONE SODIUM SUCC 40 MG IJ SOLR
40.0000 mg | INTRAMUSCULAR | Status: AC
  Administered 2019-12-03: 40 mg via INTRAVENOUS
  Filled 2019-12-03: qty 1

## 2019-12-03 MED ORDER — PROPOFOL 1000 MG/100ML IV EMUL: 5.0000 ug/kg/min | INTRAVENOUS | Status: DC

## 2019-12-03 MED ORDER — PHENYLEPHRINE HCL-NACL 10-0.9 MG/250ML-% IV SOLN
0.0000 ug/min | INTRAVENOUS | Status: DC
  Administered 2019-12-03: 20 ug/min via INTRAVENOUS
  Filled 2019-12-03: qty 250

## 2019-12-03 MED ORDER — FENTANYL 2500MCG IN NS 250ML (10MCG/ML) PREMIX INFUSION
0.0000 ug/h | INTRAVENOUS | Status: DC
  Administered 2019-12-03: 200 ug/h via INTRAVENOUS
  Filled 2019-12-03: qty 250

## 2019-12-03 MED ORDER — NOREPINEPHRINE 4 MG/250ML-% IV SOLN
0.0000 ug/min | INTRAVENOUS | Status: DC
  Filled 2019-12-03: qty 250

## 2019-12-03 MED ORDER — MIDAZOLAM HCL 2 MG/2ML IJ SOLN
2.0000 mg | Freq: Once | INTRAMUSCULAR | Status: AC
  Administered 2019-12-03: 2 mg via INTRAVENOUS

## 2019-12-03 MED ORDER — ROCURONIUM BROMIDE 50 MG/5ML IV SOLN
INTRAVENOUS | Status: AC
  Administered 2019-12-03: 81.4 mg via INTRAVENOUS
  Filled 2019-12-03: qty 1

## 2019-12-03 MED ORDER — LEVALBUTEROL HCL 1.25 MG/0.5ML IN NEBU
1.2500 mg | INHALATION_SOLUTION | Freq: Three times a day (TID) | RESPIRATORY_TRACT | Status: DC
  Administered 2019-12-03: 1.25 mg via RESPIRATORY_TRACT
  Filled 2019-12-03: qty 0.5

## 2019-12-03 MED ORDER — SODIUM CHLORIDE 0.9 % IV BOLUS: 1000.0000 mL | Freq: Once | INTRAVENOUS | Status: DC

## 2019-12-03 MED ORDER — ROCURONIUM BROMIDE 50 MG/5ML IV SOLN
INTRAVENOUS | Status: AC
  Filled 2019-12-03: qty 1

## 2019-12-03 MED ORDER — ESMOLOL HCL-SODIUM CHLORIDE 2000 MG/100ML IV SOLN
25.0000 ug/kg/min | INTRAVENOUS | Status: DC
  Administered 2019-12-03: 25 ug/kg/min via INTRAVENOUS
  Filled 2019-12-03: qty 100

## 2019-12-03 MED ORDER — SODIUM CHLORIDE 0.9 % IV SOLN: INTRAVENOUS | Status: DC

## 2019-12-03 MED ORDER — NOREPINEPHRINE 4 MG/250ML-% IV SOLN
0.0000 ug/min | INTRAVENOUS | Status: DC
  Administered 2019-12-03: 10 ug/min via INTRAVENOUS
  Filled 2019-12-03: qty 250

## 2019-12-03 MED ORDER — ROCURONIUM BROMIDE 50 MG/5ML IV SOLN: 1.0000 mg/kg | Freq: Once | INTRAVENOUS | Status: AC

## 2019-12-03 MED ORDER — CHLORHEXIDINE GLUCONATE 0.12% ORAL RINSE (MEDLINE KIT): 15.0000 mL | Freq: Two times a day (BID) | OROMUCOSAL | Status: DC

## 2019-12-03 MED ORDER — ORAL CARE MOUTH RINSE: 15.0000 mL | OROMUCOSAL | Status: DC

## 2019-12-03 MED ORDER — ETOMIDATE 2 MG/ML IV SOLN
INTRAVENOUS | Status: AC
  Administered 2019-12-03: 20 mg via INTRAVENOUS
  Filled 2019-12-03: qty 10

## 2019-12-03 MED ORDER — SODIUM CHLORIDE 0.9 % IV BOLUS
1000.0000 mL | Freq: Once | INTRAVENOUS | Status: AC
  Administered 2019-12-03: 1000 mL via INTRAVENOUS

## 2019-12-03 MED ORDER — VASOPRESSIN 20 UNIT/ML IV SOLN
0.0300 [IU]/min | INTRAVENOUS | Status: DC
  Filled 2019-12-03: qty 2

## 2019-12-04 ENCOUNTER — Encounter: Payer: Self-pay | Admitting: Cardiothoracic Surgery

## 2019-12-04 LAB — RHEUMATOID FACTOR: Rheumatoid fact SerPl-aCnc: 11.7 IU/mL (ref 0.0–13.9)

## 2019-12-04 LAB — CULTURE, BLOOD (ROUTINE X 2)
Culture: NO GROWTH
Culture: NO GROWTH
Special Requests: ADEQUATE
Special Requests: ADEQUATE

## 2019-12-04 LAB — ANA W/REFLEX: Anti Nuclear Antibody (ANA): NEGATIVE

## 2019-12-05 ENCOUNTER — Ambulatory Visit: Payer: Medicaid Other | Admitting: Cardiovascular Disease

## 2019-12-06 ENCOUNTER — Ambulatory Visit: Payer: Medicaid Other | Admitting: Family

## 2019-12-07 LAB — AEROBIC/ANAEROBIC CULTURE W GRAM STAIN (SURGICAL/DEEP WOUND): Culture: NO GROWTH

## 2019-12-07 LAB — MPO/PR-3 (ANCA) ANTIBODIES
ANCA Proteinase 3: <3.5 U/mL (ref 0.0–3.5)
Myeloperoxidase Abs: <9 U/mL (ref 0.0–9.0)

## 2019-12-12 NOTE — Progress Notes (Signed)
Hinton Dyer, NP notified of patients O2 sats dropping below 88%. Acknowledged. See new order.

## 2019-12-12 NOTE — Progress Notes (Signed)
Progress Note  Patient Name: Larry Terrell Date of Encounter: Dec 18, 2019  Primary Cardiologist: Ida Rogue, MD   Subjective   Chest x-ray worsened from prior.  Respiratory failure, intubated this a.m.  Possible restrictive lung physiology.  On antibiotics, no fevers noted.  Was started on IV Lopressor every 6 hours to help with heart rate.  Inpatient Medications    Scheduled Meds: .  stroke: mapping our early stages of recovery book   Does not apply Once  . aspirin  81 mg Oral Daily  . atorvastatin  80 mg Oral QPM  . chlorhexidine gluconate (MEDLINE KIT)  15 mL Mouth Rinse BID  . Chlorhexidine Gluconate Cloth  6 each Topical Daily  . ezetimibe  10 mg Oral Daily  . fentaNYL (SUBLIMAZE) injection  100 mcg Intravenous Once  . insulin aspart  0-15 Units Subcutaneous TID WC  . insulin aspart  0-5 Units Subcutaneous QHS  . insulin glargine  5 Units Subcutaneous Daily  . levalbuterol  1.25 mg Nebulization Q8H  . mouth rinse  15 mL Mouth Rinse 10 times per day  . midazolam  2 mg Intravenous Once  . pantoprazole (PROTONIX) IV  40 mg Intravenous QHS  . pregabalin  50 mg Oral TID  . Ensure Max Protein  11 oz Oral BID  . rocuronium      . sodium chloride flush  5 mL Intracatheter Q8H  . venlafaxine  75 mg Oral TID   Continuous Infusions: . esmolol    . fentaNYL infusion INTRAVENOUS 200 mcg/hr (18-Dec-2019 1037)  . heparin 600 Units/hr (18-Dec-2019 7902)  . levETIRAcetam 400 mL/hr at December 18, 2019 0536  . phenylephrine (NEO-SYNEPHRINE) Adult infusion    . piperacillin-tazobactam 3.375 g (2019/12/18 0847)  . propofol (DIPRIVAN) infusion    . sodium chloride     PRN Meds: acetaminophen **OR** acetaminophen, hydrALAZINE, [DISCONTINUED] ondansetron **OR** ondansetron (ZOFRAN) IV, oxyCODONE   Vital Signs    Vitals:   December 18, 2019 0430 2019/12/18 0553 2019/12/18 0600 12-18-2019 1020  BP:  132/74 102/77 131/86  Pulse: 95 (!) 108 (!) 114 (!) 134  Resp: 19 18 (!) 26 (!) 28  Temp:      TempSrc:       SpO2: (!) 89% (!) 87% (!) 86% 95%  Weight:      Height:        Intake/Output Summary (Last 24 hours) at 12/18/2019 1139 Last data filed at 12/18/19 0536 Gross per 24 hour  Intake 781.23 ml  Output 1570 ml  Net -788.77 ml   Last 3 Weights 2019-12-18 12/02/2019 12/01/2019  Weight (lbs) 179 lb 7.3 oz 186 lb 1.1 oz 181 lb 3.5 oz  Weight (kg) 81.4 kg 84.4 kg 82.2 kg      Telemetry    Atrial fibrillation, heart rate 124- Personally Reviewed  ECG    No new ECG, obtained- Personally Reviewed  Physical Exam   GEN:  intubated, sedated  Neck: No JVD Cardiac:  Irregular irregular Respiratory:  vented breath sounds. GI: Soft, nontender, distended , cholecystostomy tube/bag noted MS: No edema; No deformity. Neuro:  unable to assess Psych: Unable to assess   Labs    High Sensitivity Troponin:   Recent Labs  Lab 11/28/2019 1208 11/17/2019 1405 11/17/2019 2111  TROPONINIHS _0 Chemistry Recent Labs  Lab 12/02/19 0508 12/02/19 1822 12-18-19 0437  NA 143 142 145  K 3.5 3.4* 3.4*  CL 96* 96* 97*  CO2 34* 34* 32  GLUCOSE  169* 145* 123*  BUN 40* 42* 46*  CREATININE 1.70* 2.01* 1.97*  CALCIUM 8.6* 8.5* 8.5*  PROT 6.7 6.9 7.1  ALBUMIN 2.5* 2.4* 2.5*  AST 137* 108* 95*  ALT 142* 119* 102*  ALKPHOS 500* 537* 494*  BILITOT 5.9* 3.3* 3.1*  GFRNONAA 43* 35* 36*  GFRAA 49* 40* 41*  ANIONGAP 13 12 16*     Hematology Recent Labs  Lab 12/02/19 0508 12/02/19 1822 12/10/2019 0437  WBC 14.0* 13.4* 14.0*  RBC 2.95* 3.07* 2.97*  HGB 8.6* 8.8* 8.8*  HCT 27.9* 29.1* 28.9*  MCV 94.6 94.8 97.3  MCH 29.2 28.7 29.6  MCHC 30.8 30.2 30.4  RDW 19.2* 18.6* 18.5*  PLT 161 197 187    BNP Recent Labs  Lab 12/02/19 1822  BNP 295.5*     DDimer No results for input(s): DDIMER in the last 168 hours.   Radiology    IR Perc Cholecystostomy  Result Date: 12/02/2019 INDICATION: 59 year old with acute cholecystitis. Patient has respiratory distress. Patient is not a  surgical candidate at this time. EXAM: IMAGE GUIDED CHOLECYSTOSTOMY TUBE PLACEMENT MEDICATIONS: Moderate sedation ANESTHESIA/SEDATION: Moderate (conscious) sedation was employed during this procedure. A total of Versed 0.5 mg and Fentanyl 50 mcg was administered intravenously. Moderate Sedation Time: 15 minutes. The patient's level of consciousness and vital signs were monitored continuously by radiology nursing throughout the procedure under my direct supervision. FLUOROSCOPY TIME:  None COMPLICATIONS: None immediate. PROCEDURE: Informed consent was obtained for cholecystostomy tube placement. A time-out was performed prior to the procedure. Ultrasound was used to identify the gallbladder. The right abdomen was prepped and draped in sterile fashion. Maximal barrier sterile technique was utilized including caps, mask, sterile gowns, sterile gloves, sterile drape, hand hygiene and skin antiseptic. Skin was anesthetized with 1% lidocaine. Using ultrasound guidance, an 18 gauge trocar needle was directed into the gallbladder from a transhepatic approach. Needle position confirmed within the gallbladder. Wire was advanced into the gallbladder under ultrasound guidance. The tract was dilated to accommodate a 10 [place] multipurpose drain. 115 mL black bilious fluid was removed. A sample of the fluid was sent for culture. Catheter was sutured to skin and attached to a gravity bag. Ultrasound images were taken and saved for this procedure. FINDINGS: Small amount of perihepatic ascites. 115 mL of dark bile was removed. Gallbladder was decompressed after drain placement. IMPRESSION: Successful placement of a percutaneous cholecystostomy tube with ultrasound guidance. Electronically Signed   By: Markus Daft M.D.   On: 12/02/2019 20:36   DG Chest Port 1 View  Result Date: Dec 10, 2019 CLINICAL DATA:  Check endotracheal tube placement EXAM: PORTABLE CHEST 1 VIEW COMPARISON:  12-10-19 FINDINGS: Cardiac shadow is enlarged but  stable. Postsurgical changes are again seen. Endotracheal tube and gastric catheter are noted in satisfactory position. Parenchymal opacities are noted bilaterally consistent with multifocal pneumonia stable in appearance from the prior exam. No bony abnormality is seen. IMPRESSION: Tubes and lines as described in satisfactory position. Stable multifocal pneumonia. Electronically Signed   By: Inez Catalina M.D.   On: 12/10/19 10:44   DG Chest Port 1 View  Result Date: 12/10/19 CLINICAL DATA:  Acute respiratory failure EXAM: PORTABLE CHEST 1 VIEW COMPARISON:  12/02/2019 FINDINGS: Multifocal patchy opacities, with relative sparing of the right upper lobe, raising concern for multifocal infection. Asymmetric interstitial edema is technically possible but considered less likely. Possible small bilateral pleural effusions, although equivocal. This appearance is unchanged when compared to the recent prior. No pneumothorax. The heart is  normal in size. Median sternotomy. IMPRESSION: Multifocal patchy opacities, favoring multifocal infection over interstitial edema, unchanged. Electronically Signed   By: Julian Hy M.D.   On: 12-05-19 05:45   DG Chest Port 1 View  Result Date: 12/02/2019 CLINICAL DATA:  Acute stroke, tachycardia, leukocytosis, heart failure EXAM: PORTABLE CHEST 1 VIEW COMPARISON:  12/08/2019 FINDINGS: No significant change in AP portable examination with diffuse bilateral interstitial and heterogeneous pulmonary opacity, with possible small layering pleural effusions. No new or focal airspace opacity. Cardiomegaly status post median sternotomy and CABG. IMPRESSION: No significant change in AP portable examination with diffuse bilateral interstitial and heterogeneous pulmonary opacity, with possible small layering pleural effusions, findings consistent with multifocal infection or edema. No new or focal airspace opacity. Electronically Signed   By: Eddie Candle M.D.   On: 12/02/2019 12:33     Cardiac Studies   Echo 11/27/2019 1. Left ventricular ejection fraction, by estimation, is 55 to 60%. The  left ventricle has normal function. The left ventricle has no regional  wall motion abnormalities. There is mild left ventricular hypertrophy.  Left ventricular diastolic parameters  are indeterminate.  2. Right ventricular systolic function is normal. The right ventricular  size is normal. There is normal pulmonary artery systolic pressure.  3. Left atrial size was moderately dilated.  4. Mild mitral valve regurgitation.  5. Images suggestive of left pleural effusion  Patient Profile     59 y.o. male with history of CAD/CABG, persistent A. fib on Eliquis, heart failure preserved ejection fraction presented with a fall, found to have an acute stroke on MRI.  Hospital course complicated by tachycardia, leukocytosis, jaundice, diagnosed with acute cholecystitis on CT abdomen scan.  Being seen for acute on chronic heart failure preserved ejection fraction, afib.  Assessment & Plan    1. HFpEF -Lasix held due to rising creatinine. -Creatinine plateaued, slight improvement this a.m. -Continue to monitor creatinine.  2.  Persistent atrial fibrillation -Agree with stopping amiodarone to prevent toxicity, in [place] of patient's possible restrictive lung disease. -Start esmolol drip for better heart rate control. -Heparin drip with heparin gtt and transition to po/NOAC when able to tolerate po  3.  Altered mental status, acute ischemic stroke -On aspirin, statin -No source of cardiac emboli from TEE  4.  Acute cholecystitis -S/Pcholecystostomy tube -On antibiotics  5.  Respiratory failure -Chest x-ray worsened today.?  ARDS/restrictive lung disease -Intubated sedated -Appreciate input from pulmonary/critical care team  Critical care time was exclusive of separate billable procedures and treating other patients.  Critial care time was spent personally by me  (independant of midlevel providers) on the following activities: development of treatment plan,  evaluation of patients response to treatment, examining patient, reviewing treatments/ interventions, lab studies, radiographic studies, pulse ox, and re-evaluation of patients condition.      The patient is critically ill with multiple organ systems failure and requires high complexity decision making for assessment and support, frequent evaluation and titration of therapies, application of advanced monitoring technologies and extensive interpretation of databases.   Critical care was necessary to treat or prevent immintent or life-threatening deterioration.     Total CCT spent directly with the patient today is 30 minutes    Signed, Kate Sable, MD  12-05-2019, 11:39 AM

## 2019-12-12 NOTE — Procedures (Signed)
Central Venous Catheter Placement:TRIPLE LUMEN Indication: Patient has limited or no vascular access. Patient critically ill with acute on chronic respiratory failure, intubated mechanically ventilated. Patient will require pressors.  Consent:emergent  Hand washing performed prior to starting the procedure.   Procedure:   An active timeout was performed and correct patient, name, & ID confirmed.  Patient was positioned correctly for central venous access.  Patient was prepped using strict sterile technique including chlorohexidine preps, sterile drape, sterile gown and sterile gloves.    The area was prepped, draped and anesthetized in the usual sterile manner. Patient was on analgesic infusion and sedation already. Local anesthetic utilized for the procedure.  Using Seldinger technique and under direct visualization with ultrasound, atriple lumen catheter was placed in the LEFT internal jugular vein. There was good blood return, catheter caps were placed on lumens, catheter flushed easily, the line was secured and a sterile dressing and BIO-PATCH applied.   Ultrasound was used to visualize vasculature and guidance of needle.   Number of Attempts: 1 Complications:none Estimated Blood Loss: Less than 5 mL. Chest x-ray: No pneumothorax, line in good position.  Operator: Windy Canny. Derrill Kay, MD  PCCM  *This note was dictated using voice recognition software/Dragon.  Despite best efforts to proofread, errors can occur which can change the meaning.  Any change was purely unintentional.

## 2019-12-12 NOTE — Progress Notes (Signed)
[place] for heparin Indication: atrial fibrillation  Allergies  Allergen Reactions  . Gabapentin   . Ace Inhibitors Cough    Other reaction(s): Other (See Comments) cough    Patient Measurements: Height: 5' 7"  (170.2 cm) Weight: 81.4 kg (179 lb 7.3 oz) IBW/kg (Calculated) : 66.1 Heparin Dosing Weight: 73 kg  Vital Signs: Temp: 99.3 F (37.4 C) (05/23 0416) Temp Source: Axillary (05/23 0416) BP: 102/77 (05/23 0600) Pulse Rate: 114 (05/23 0600)  Labs: Recent Labs    11/30/19 2309 12/01/19 0603 12/02/19 0508 12/02/19 0508 12/02/19 1822 12/02/19 2234 12-24-2019 0437  HGB  --    < > 8.6*   < > 8.8*  --  8.8*  HCT  --    < > 27.9*  --  29.1*  --  28.9*  PLT  --    < > 161  --  197  --  187  APTT 52*   < > 71*  --   --  116* 155*  LABPROT 23.3*  --  18.9*  --   --   --   --   INR 2.2*  --  1.7*  --   --   --   --   HEPARINUNFRC 1.72*  --  2.12*  --   --   --  3.08*  CREATININE  --    < > 1.70*  --  2.01*  --  1.97*   < > = values in this interval not displayed.    Estimated Creatinine Clearance: 40.2 mL/min (A) (by C-G formula based on SCr of 1.97 mg/dL (H)).   Medical History: Past Medical History:  Diagnosis Date  . CHF (congestive heart failure) (HCC)    diastolic (echo at [facility] 4801) EF 55-60%  . Cirrhosis ([place])    NASH per records  . COPD (chronic obstructive pulmonary disease) ([place])   . Depression   . Diabetes mellitus with neuropathy ([place])   . Heart attack ([place])   . Neuropathy   . OSA (obstructive sleep apnea)   . Peripheral vascular disease due to secondary diabetes mellitus ([place])   . Splenomegaly   . 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 TIA (transient ischemic attack) April 2016     Assessment: Patient admitted for fall weakness, w/ h/o CAD s/p PCT, CABG x 5, Cirrhosis s/t NASH, atrial fibrillation on eliquis PTA. Baseline CBC low but stable for patient, aPTT/INR elevated, anti-Xa pending. Patient is being  transitioned from eliquis (last dose 05/20 1300) to IV heparin for anticoagulation for atrial fibrillation w/ CHADS-VASc = 3.  5/21 On 5/21 patient was planned to receive IR procedure for cholecystostomy this AM, but INR was elevated ~2.2.  Patient received FFP x 2 doses.  Heparin was held for possible procedure. Procedure rescheduled for 5/22 at 0900.    Goal of Therapy:  APTT 66 - 102 seconds Heparin level 0.3-0.7 units/ml Monitor platelets by anticoagulation protocol: Yes   Plan:  5/23 0437 HL 3.08, aPTT 155sec. Supratherapeutic. Will decrease heparin to 600 units/hr. Plan to check APTT 6 hours after rate change. CBC stable. Will order CBC and HL with morning labs. Will continue to follow APTT until it correlates with HL.  Pearla Dubonnet, PharmD Clinical Pharmacist Dec 24, 2019 6:33 AM

## 2019-12-12 NOTE — Progress Notes (Signed)
CC: cholecystitis Subjective: Intubated this am LFT continue to improve No bacteria gram stain Wbc hovering 14 k  Objective: Vital signs in last 24 hours: Temp:  [98.1 F (36.7 C)-99.3 F (37.4 C)] 99.3 F (37.4 C) (05/23 0416) Pulse Rate:  [28-134] 134 (05/23 1020) Resp:  [12-28] 28 (05/23 1020) BP: (102-166)/(68-96) 131/86 (05/23 1020) SpO2:  [86 %-98 %] 95 % (05/23 1020) FiO2 (%):  [98 %-100 %] 100 % (05/23 0957) Weight:  [81.4 kg] 81.4 kg (05/23 0421) Last BM Date: (PTA)  Intake/Output from previous day: 05/22 0701 - 05/23 0700 In: 781.2 [I.V.:310.1; IV Piggyback:471.2] Out: 3474 [Urine:1500; Drains:70] Intake/Output this shift: No intake/output data recorded.  Physical exam: Sedated ABd: soft, chole drain w bile and some thin sanguinous drainage. No peritonitis  Lab Results: CBC  Recent Labs    12/02/19 1822 2020-01-01 0437  WBC 13.4* 14.0*  HGB 8.8* 8.8*  HCT 29.1* 28.9*  PLT 197 187   BMET Recent Labs    12/02/19 1822 01-Jan-2020 0437  NA 142 145  K 3.4* 3.4*  CL 96* 97*  CO2 34* 32  GLUCOSE 145* 123*  BUN 42* 46*  CREATININE 2.01* 1.97*  CALCIUM 8.5* 8.5*   PT/INR Recent Labs    11/30/19 2309 12/02/19 0508  LABPROT 23.3* 18.9*  INR 2.2* 1.7*   ABG Recent Labs    12/02/19 1701 2020/01/01 0907  PHART 7.42 7.44  HCO3 38.9* 35.3*    Studies/Results: IR Perc Cholecystostomy  Result Date: 12/02/2019 INDICATION: 59 year old with acute cholecystitis. Patient has respiratory distress. Patient is not a surgical candidate at this time. EXAM: IMAGE GUIDED CHOLECYSTOSTOMY TUBE PLACEMENT MEDICATIONS: Moderate sedation ANESTHESIA/SEDATION: Moderate (conscious) sedation was employed during this procedure. A total of Versed 0.5 mg and Fentanyl 50 mcg was administered intravenously. Moderate Sedation Time: 15 minutes. The patient's level of consciousness and vital signs were monitored continuously by radiology nursing throughout the procedure under my  direct supervision. FLUOROSCOPY TIME:  None COMPLICATIONS: None immediate. PROCEDURE: Informed consent was obtained for cholecystostomy tube placement. A time-out was performed prior to the procedure. Ultrasound was used to identify the gallbladder. The right abdomen was prepped and draped in sterile fashion. Maximal barrier sterile technique was utilized including caps, mask, sterile gowns, sterile gloves, sterile drape, hand hygiene and skin antiseptic. Skin was anesthetized with 1% lidocaine. Using ultrasound guidance, an 18 gauge trocar needle was directed into the gallbladder from a transhepatic approach. Needle position confirmed within the gallbladder. Wire was advanced into the gallbladder under ultrasound guidance. The tract was dilated to accommodate a 10 [place] multipurpose drain. 115 mL black bilious fluid was removed. A sample of the fluid was sent for culture. Catheter was sutured to skin and attached to a gravity bag. Ultrasound images were taken and saved for this procedure. FINDINGS: Small amount of perihepatic ascites. 115 mL of dark bile was removed. Gallbladder was decompressed after drain placement. IMPRESSION: Successful placement of a percutaneous cholecystostomy tube with ultrasound guidance. Electronically Signed   By: Markus Daft M.D.   On: 12/02/2019 20:36   DG Chest Port 1 View  Result Date: 01/01/2020 CLINICAL DATA:  Acute respiratory failure EXAM: PORTABLE CHEST 1 VIEW COMPARISON:  12/02/2019 FINDINGS: Multifocal patchy opacities, with relative sparing of the right upper lobe, raising concern for multifocal infection. Asymmetric interstitial edema is technically possible but considered less likely. Possible small bilateral pleural effusions, although equivocal. This appearance is unchanged when compared to the recent prior. No pneumothorax. The heart is normal in  size. Median sternotomy. IMPRESSION: Multifocal patchy opacities, favoring multifocal infection over interstitial edema,  unchanged. Electronically Signed   By: Julian Hy M.D.   On: Dec 10, 2019 05:45   DG Chest Port 1 View  Result Date: 12/02/2019 CLINICAL DATA:  Acute stroke, tachycardia, leukocytosis, heart failure EXAM: PORTABLE CHEST 1 VIEW COMPARISON:  11/17/2019 FINDINGS: No significant change in AP portable examination with diffuse bilateral interstitial and heterogeneous pulmonary opacity, with possible small layering pleural effusions. No new or focal airspace opacity. Cardiomegaly status post median sternotomy and CABG. IMPRESSION: No significant change in AP portable examination with diffuse bilateral interstitial and heterogeneous pulmonary opacity, with possible small layering pleural effusions, findings consistent with multifocal infection or edema. No new or focal airspace opacity. Electronically Signed   By: Eddie Candle M.D.   On: 12/02/2019 12:33    Anti-infectives: Anti-infectives (From admission, onward)   Start     Dose/Rate Route Frequency Ordered Stop   12/01/19 0000  piperacillin-tazobactam (ZOSYN) IVPB 3.375 g     3.375 g 12.5 mL/hr over 240 Minutes Intravenous Every 8 hours 11/30/19 2105     11/30/19 1100  vancomycin (VANCOCIN) IVPB 1000 mg/200 mL premix  Status:  Discontinued     1,000 mg 200 mL/hr over 60 Minutes Intravenous Every 12 hours 11/30/19 0303 11/30/19 1314   11/30/19 1000  ceFEPIme (MAXIPIME) 2 g in sodium chloride 0.9 % 100 mL IVPB  Status:  Discontinued     2 g 200 mL/hr over 30 Minutes Intravenous Every 8 hours 11/30/19 0259 11/30/19 2105   11/30/19 0115  vancomycin (VANCOREADY) IVPB 1750 mg/350 mL     1,750 mg 175 mL/hr over 120 Minutes Intravenous  Once 11/30/19 0105 11/30/19 0415   12/02/2019 2230  ceFEPIme (MAXIPIME) 1 g in sodium chloride 0.9 % 100 mL IVPB  Status:  Discontinued     1 g 200 mL/hr over 30 Minutes Intravenous Every 8 hours 12/09/2019 2218 11/30/19 0259   12/01/2019 2000  cefTRIAXone (ROCEPHIN) 2 g in sodium chloride 0.9 % 100 mL IVPB  Status:   Discontinued     2 g 200 mL/hr over 30 Minutes Intravenous Every 24 hours 11/14/2019 1953 11/24/19 0809   11/16/2019 2000  azithromycin (ZITHROMAX) 500 mg in sodium chloride 0.9 % 250 mL IVPB  Status:  Discontinued     500 mg 250 mL/hr over 60 Minutes Intravenous Every 24 hours 12/11/2019 1953 11/24/19 0809      Assessment/Plan: Acute cholecystitis in a critically ill individual now with respiratory failure intubated. Keep cholecystostomy tube and continue A/Bs Not a surgical candidate I spent 25 minutes in this encounter with greater than 50% spent in coordination and counseling of his care   Caroleen Hamman, MD, [facility]  2019/12/10

## 2019-12-12 NOTE — Code Documentation (Signed)
Approx 1445 hours first CODE BLUE:  Patient was intubated and on mechanical ventilation, he had bursts of aberrantly conducted SVT on monitor which then turned into atrial fib with RVR, subsequently had pauses then developed bradycardia and subsequently asystole.  CODE BLUE was called, compressions started by MD.  ACLS protocol initiated, patient being bagged by RT.  Received epinephrine 1 amp and bicarb 1 amp.  Rhythm returned, tachycardia with chaotic rhythm.  Pulse returned.  Patient was placed back on vent.  Arterial blood gases were attempted but appears a sample was venous.  However showed that patient has significant acidosis.  Patient's pulse started to decline again he received half an amp of epi.  Norepinephrine then initiated.  And rhythm and pulse maintained.  At this time code was called on the patient remained in the ICU intubated and mechanically ventilated.  Prognosis exceedingly poor.  Family at bedside, apprised of the events.  Shortly after terminating first CODE BLUE at 1507 patient developed erratic rhythm again bradycardia and then asystole.  Compressions initiated.  Responded to 1 amp of bicarb and 1 amp of epi.  Placed back on ventilator.  Rhythm remains erratic.  Discussed with family will continue supportive measures but patient will be now DNR.    Renold Don, MD [facility]  *This note was dictated using voice recognition software/Dragon.  Despite best efforts to proofread, errors can occur which can change the meaning.  Any change was purely unintentional.

## 2019-12-12 NOTE — Progress Notes (Signed)
[place] for heparin Indication: atrial fibrillation  Allergies  Allergen Reactions  . Gabapentin   . Ace Inhibitors Cough    Other reaction(s): Other (See Comments) cough    Patient Measurements: Height: 5' 7"  (170.2 cm) Weight: 81.4 kg (179 lb 7.3 oz) IBW/kg (Calculated) : 66.1 Heparin Dosing Weight: 73 kg  Vital Signs: Temp: 99.3 F (37.4 C) (05/23 0416) Temp Source: Axillary (05/23 0416) BP: 131/86 (05/23 1020) Pulse Rate: 134 (05/23 1020)  Labs: Recent Labs    11/30/19 2309 12/01/19 0603 12/02/19 0508 12/02/19 0508 12/02/19 1822 12/02/19 2234 12/14/19 0437 December 14, 2019 1237  HGB  --    < > 8.6*   < > 8.8*  --  8.8*  --   HCT  --    < > 27.9*  --  29.1*  --  28.9*  --   PLT  --    < > 161  --  197  --  187  --   APTT 52*   < > 71*   < >  --  116* 155* 73*  LABPROT 23.3*  --  18.9*  --   --   --   --   --   INR 2.2*  --  1.7*  --   --   --   --   --   HEPARINUNFRC 1.72*  --  2.12*  --   --   --  3.08*  --   CREATININE  --    < > 1.70*   < > 2.01*  --  1.97* 2.19*   < > = values in this interval not displayed.    Estimated Creatinine Clearance: 36.2 mL/min (A) (by C-G formula based on SCr of 2.19 mg/dL (H)).   Medical History: Past Medical History:  Diagnosis Date  . CHF (congestive heart failure) (HCC)    diastolic (echo at [facility] [id number]) EF 55-60%  . Cirrhosis ([place])    NASH per records  . COPD (chronic obstructive pulmonary disease) ([place])   . Depression   . Diabetes mellitus with neuropathy ([place])   . Heart attack ([place])   . Neuropathy   . OSA (obstructive sleep apnea)   . Peripheral vascular disease due to secondary diabetes mellitus ([place])   . Splenomegaly   . 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 TIA (transient ischemic attack) April 2016     Assessment: Patient admitted for fall weakness, w/ h/o CAD s/p PCT, CABG x 5, Cirrhosis s/t NASH, atrial fibrillation on eliquis PTA. Baseline CBC low but stable for  patient, aPTT/INR elevated, anti-Xa pending. Patient is being transitioned from eliquis (last dose 05/20 1300) to IV heparin for anticoagulation for atrial fibrillation w/ CHADS-VASc = 3.  5/21 On 5/21 patient was planned to receive IR procedure for cholecystostomy this AM, but INR was elevated ~2.2.  Patient received FFP x 2 doses.  Heparin was held for possible procedure. Procedure rescheduled for 5/22 at 0900.    Goal of Therapy:  APTT 66 - 102 seconds Heparin level 0.3-0.7 units/ml Monitor platelets by anticoagulation protocol: Yes   Plan:  5/23 1237 aPTT 73 sec, therapeutic x 1. Continue heparin at 600 units/hr. Recheck APTT at 1900 to confirm. CBC and HL with morning labs. Will continue to follow APTT until it correlates with HL.  Tawnya Crook, PharmD Clinical Pharmacist 2019/12/14 1:36 PM

## 2019-12-12 NOTE — Death Summary Note (Signed)
DEATH SUMMARY   Patient Details  Name: Larry Terrell MRN: [id number] DOB: Mar 13, 1961  Admission/Discharge Information   Admit Date:  Dec 03, 2019  Date of Death:  2019-12-13  Time of Death:  1526 hrs.  Length of Stay: 10  Referring Physician: Medicine, [place] [facility]   Reason(s) for Hospitalization  Acute on chronic heart failure  Diagnoses  Preliminary cause of death:  Cardiac arrest Secondary Diagnoses (including complications and co-morbidities):  Principal Problem:   Acute on chronic heart failure with preserved ejection fraction (HFpEF) (HCC) Active Problems:   Diabetes mellitus with complication (HCC)   Acute on chronic respiratory failure with hypoxia and hypercapnia (HCC)   Acute kidney injury ([place])   CAD in native artery   Essential hypertension   PAD (peripheral artery disease) (HCC)   Encephalopathy   Persistent atrial fibrillation (HCC)   Stroke (cerebrum) (HCC)   Acute cholecystitis   Grade II diastolic dysfunction   Restrictive lung disease   Metabolic syndrome   Acute respiratory distress syndrome (ARDS) (HCC)   Multi-organ failure with heart failure ([place])   Transaminitis   Brief Hospital Course (including significant findings, care, treatment, and services provided and events leading to death)  Larry Terrell is a 59 y.o. year old male with very complex multiple comorbidities,remote former smoker, admitted to [facility] on 11/28/2019 after a fall in his skilled nursing facility.  Patient is status post recent CABG x5 at [facility] on 26 October 2019.  After CABG he had had respiratory failure and required PCCM consultation.  He was eventually weaned off the ventilator postop and had a prolonged course at [facility] finally discharged on 26 April to a skilled nursing facility for rehab.  On evaluation of the emergency room at [facility] the patient was noted to be hypoxic requiring 3 to 4 L/min of oxygen (he had been discharged post CABG on 2 L/min) the patient became  more hypoxic and had to eventually been placed on BiPAP.  He was given Lasix and admitted to the stepdown unit.  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  On 20 May he was noted to have abdominal pain and was noted to have acute cholecystitis.  He underwent a percutaneous cholecystectomy tube placement 5/22.  He continued to have requirement of BiPAP and or high flow O2.  On 522 it was noted that he had not been able to tolerate high flow O2 and has required to be on BiPAP.  Chest x-ray bilateral interstitial pulmonary opacities that hadbeen worsening since admission.  The patient was on  piperacillin tazobactam for his cholecystitis.  Of note he has been on amiodarone since his CABG. despite amiodarone infusion the patient continued to have issues with rapid ventricular response with atrial fib and bursts of SVT with aberrancy.  PCCM was consulted on 5/22 to manage acute on chronic respiratory failure and to weigh in on bilateral infiltrates.  The patient was switched to AVAPS noninvasive ventilation and initially did somewhat better on this.  However over the course of the night he developed further issues with desaturations and had to be given Lasix and Solu-Medrol x1.  He seemed to respond somewhat to these measures.  He was noted to have however worsening renal failure and persistent issues with transaminitis.  Patient also exhibited worsening encephalopathy.  Laboratory markers were elevated.  The patient became  more encephalopathic and x-ray showed persistent and worsening of bilateral infiltrates.  He was intubated on the morning of 12-26-2022 and central access was obtained.  Despite aggressive management the patient became more erratic with his cardiac rhythm and developed bradycardia and subsequently cardiac arrest.  He underwent CODE BLUE protocol x2.  The patient would not sustain viable rhythm and after discussion with  the family was made DNR.  He developed bradycardia and then subsequently asystole and was pronounced dead at 1526 hrs. on 2019/12/26.  Family was in attendance.    Pertinent Labs and Studies  Significant Diagnostic Studies EEG  Result Date: 11/24/2019 Alexis Goodell, MD     11/24/2019  4:44 PM ELECTROENCEPHALOGRAM REPORT Patient: Larry Terrell       Room #: 249A-AA EEG No. ID: [id number] Age: 59 y.o.        Sex: male Requesting Physician: [place] Report Date:  11/24/2019       Interpreting Physician: Alexis Goodell History: Larry Terrell is an 59 y.o. male with altered mental status Medications: Amiodarone, Eliquis, ASA, Insulin, Lasix, Keppra, Lopressor, Lyrica, Effexor, Zetia, Lipitor Conditions of Recording:  This is a 21 channel routine scalp EEG performed with bipolar and monopolar montages arranged in accordance to the international 10/20 system of electrode placement. One channel was dedicated to EKG recording. The patient is in the awake state. Description:  The background activity is slow and poorly organized.  It consists of low voltage activity in the delta-theta continuum.  This activity is diffusely distributed and continuous throughout the recording.  No epileptiform activity is noted.  Hyperventilation was not performed. Intermittent photic stimulation was performed but failed to illicit any change in the tracing. IMPRESSION: This is an abnormal EEG secondary to general background slowing.  This finding may be seen with a diffuse disturbance that is etiologically nonspecific, but may include a metabolic encephalopathy, among other possibilities.  No epileptiform activity was noted.  Alexis Goodell, MD Neurology [phone] 11/24/2019, 4:41 PM   DG Chest 2 View  Result Date: 11/25/2019 CLINICAL DATA:  Pleural effusion EXAM: CHEST - 2 VIEW COMPARISON:  12/08/2019 FINDINGS: Post CABG changes. Stable cardiomegaly. Progressive interstitial and alveolar opacities throughout both lungs. Small  to moderate bilateral pleural effusions. No pneumothorax. IMPRESSION: Progressive bilateral interstitial and alveolar opacities, which may reflect pulmonary edema versus multifocal pneumonia. Small to moderate bilateral pleural effusions. Electronically Signed   By: Davina Poke D.O.   On: 11/25/2019 17:02   DG Chest 2 View  Result Date: 11/20/2019 CLINICAL DATA:  Shortness of breath, fall EXAM: CHEST - 2 VIEW COMPARISON:  11/13/2019 FINDINGS: Prior CABG. Stable cardiomegaly. Diffusely increased interstitial markings throughout both lungs with patchy scattered patchy opacities in the bilateral lung bases and peripheral aspect of the right upper lobe. No large pleural fluid collection. No pneumothorax. Degenerative changes of the thoracic spine suggesting DISH. IMPRESSION: Findings suggestive of congestive heart failure with pulmonary edema. A superimposed infectious process would be difficult to exclude. Electronically Signed   By: Davina Poke D.O.   On: 11/18/2019 12:37   DG Chest 2 View  Result Date: 11/13/2019 CLINICAL DATA:  Status post recent coronary artery bypass grafting EXAM: CHEST - 2 VIEW COMPARISON:  November 04, 2019 FINDINGS: There is no appreciable edema or airspace opacity. There is cardiomegaly with pulmonary vascularity within normal limits. Patient is status post coronary artery bypass grafting. No adenopathy. There is degenerative change in the thoracic spine. IMPRESSION: Cardiomegaly. Status post coronary artery bypass grafting. No  edema or airspace opacity. Electronically Signed   By: Lowella Grip III M.D.   On: 11/13/2019 14:04   DG Chest 2 View  Result Date: 11/04/2019 CLINICAL DATA:  Pleural effusion EXAM: CHEST - 2 VIEW COMPARISON:  November 01, 2019 FINDINGS: The right PICC line is in good position. Cardiomegaly. The hila and mediastinum are normal. No pneumothorax. Mild atelectasis in the right base. No focal infiltrate. No change in the cardiomediastinal silhouette.  IMPRESSION: Stable right PICC line. Mild right basilar atelectasis. No other changes. Electronically Signed   By: Dorise Bullion III M.D   On: 11/04/2019 13:47   DG Abd 1 View  Result Date: 11/27/2019 CLINICAL DATA:  Abdominal pain and distention. EXAM: ABDOMEN - 1 VIEW COMPARISON:  None. FINDINGS: The stomach is distended. Bowel gas pattern is otherwise nonobstructive. There is a moderate amount of stool in the colon and rectum. A left common iliac stent is noted. There is no definite pneumatosis or free air. IMPRESSION: Distended stomach, otherwise the bowel gas pattern is nonobstructive. Electronically Signed   By: Constance Holster M.D.   On: 11/24/2019 21:11   CT HEAD WO CONTRAST  Result Date: 12/10/2019 CLINICAL DATA:  Fall. EXAM: CT HEAD WITHOUT CONTRAST TECHNIQUE: Contiguous axial images were obtained from the base of the skull through the vertex without intravenous contrast. COMPARISON:  MR brain and CT head dated April 01, 2019. FINDINGS: Brain: No evidence of acute infarction, hemorrhage, hydrocephalus, extra-axial collection or mass lesion/mass effect. Stable mild atrophy and chronic microvascular ischemic changes. Small remote lacunar infarct in the right cerebellum again noted. Vascular: Calcified atherosclerosis at the skullbase. No hyperdense vessel. Skull: Normal. Negative for fracture or focal lesion. Sinuses/Orbits: No acute finding. Other: None. IMPRESSION: No acute intracranial abnormality. Electronically Signed   By: Titus Dubin M.D.   On: 11/13/2019 12:47   MR BRAIN WO CONTRAST  Result Date: 11/24/2019 CLINICAL DATA:  Focal neurological deficit. Stroke suspected. EXAM: MRI HEAD WITHOUT CONTRAST TECHNIQUE: Multiplanar, multiecho pulse sequences of the brain and surrounding structures were obtained without intravenous contrast. COMPARISON:  Head CT Nov 23, 2019 FINDINGS: Brain: Area of restricted diffusion involving the splenium of the corpus callosum on the left side,  posterior aspect of the left fornix, lateral geniculate body, left occipital lobe and medial temporal lobe, including the body of the left hippocampus. Findings are consistent with a left PCA territory infarct. A single focus of restricted diffusion is also seen in the left corona radiata. Remote lacunar infarcts are seen in the right cerebellar hemisphere. Scattered foci of T2 hyperintensity are seen within the white matter of cerebral hemispheres and within the pons, nonspecific, most likely related to chronic small vessel ischemia. More pronounced than expected for age. Mild prominence of the ventricular system and cerebellar sulci reflecting parenchymal volume loss. There is no hemorrhage, hydrocephalus, extra-axial collection or mass lesion. Vascular: Diminutive caliber of the horizontal petrous segment of the left ICA. Remainder of the major arterial flow voids are preserved. Skull and upper cervical spine: Normal marrow signal. Sinuses/Orbits: Left lens surgery. Other: None. IMPRESSION: 1. Acute left PCA territory infarct. 2. Punctate focus of restricted diffusion in the left corona radiata, consistent with acute infarct. 3. Diminutive caliber of the horizontal petrous segment of the left ICA. 4. 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 5. Moderate chronic small vessel ischemic changes. More pronounced than expected for age. These results were called by telephone at the time of interpretation on 11/24/2019 at 12:13 pm to provider  CHRISTOPHER DANFORD , who verbally acknowledged these results. Electronically Signed   By: Pedro Earls M.D.   On: 11/24/2019 12:19   CT ABDOMEN PELVIS W CONTRAST  Result Date: 11/30/2019 CLINICAL DATA:  Right upper quadrant pain EXAM: CT ABDOMEN AND PELVIS WITH CONTRAST TECHNIQUE: Multidetector CT imaging of the abdomen and pelvis was performed using the standard protocol following bolus administration of intravenous contrast. CONTRAST:  140m  OMNIPAQUE IOHEXOL 300 MG/ML  SOLN COMPARISON:  Abdominal ultrasound 11/30/2019 FINDINGS: LOWER CHEST: Intermediate sized bilateral pleural effusions with bibasilar atelectasis. HEPATOBILIARY: Small volume perihepatic ascites. Gallbladder is distended with surrounding inflammatory stranding. There is mild hyperenhancement of the gallbladder fossa. PANCREAS: Normal pancreas. No ductal dilatation or peripancreatic fluid collection. SPLEEN: Areas of hypoattenuation AP due to heterogeneous enhancement, but are more likely due to streak artifacts. ADRENALS/URINARY TRACT: The adrenal glands are normal. No hydronephrosis, nephroureterolithiasis or solid renal mass. The urinary bladder is normal for degree of distention STOMACH/BOWEL: There is no hiatal hernia. Normal duodenal course and caliber. No small bowel dilatation or inflammation. No focal colonic abnormality. Status post appendectomy. 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 There is calcific aortic atherosclerosis. No abdominal or pelvic lymphadenopathy. REPRODUCTIVE: Enlarged prostate measures 5.5 cm in transverse dimension. MUSCULOSKELETAL. Multilevel degenerative disc disease and facet arthrosis. No bony spinal canal stenosis. OTHER: None. IMPRESSION: 1. Acute cholecystitis with distended, inflamed gallbladder and surrounding inflammatory stranding. 2. Intermediate sized pleural effusions and small volume perihepatic ascites. 3. Aortic Atherosclerosis (ICD10-I70.0). Electronically Signed   By: KUlyses JarredM.D.   On: 11/30/2019 22:39   [place]ot[facility]  Result Date: 11/24/2019 CLINICAL DATA:  59year old male with stroke-like symptoms EXAM: BILATERAL CAROTID DUPLEX ULTRASOUND TECHNIQUE: GPearline Cablesscale imaging, color Doppler and duplex ultrasound were performed of bilateral carotid and vertebral arteries in the neck. COMPARISON:  Prior duplex carotid ultrasound 11/06/2014 FINDINGS: Criteria:  Quantification of carotid stenosis is based on velocity parameters that correlate the residual internal carotid diameter with NASCET-based stenosis levels, using the diameter of the distal internal carotid lumen as the denominator for stenosis measurement. The following velocity measurements were obtained: RIGHT ICA: 70/28 cm/sec CCA: 891/63cm/sec SYSTOLIC ICA/CCA RATIO:  0.8 ECA:  142 cm/sec LEFT ICA: 53/23 cm/sec CCA: 1846/65cm/sec SYSTOLIC ICA/CCA RATIO:  0.5 ECA:  188 cm/sec RIGHT CAROTID ARTERY: Mild heterogeneous atherosclerotic plaque in the proximal internal carotid artery. By peak systolic velocity criteria, the estimated stenosis is less than 50%. RIGHT VERTEBRAL ARTERY:  Patent with antegrade flow. LEFT CAROTID ARTERY: Mild heterogeneous atherosclerotic plaque in the proximal internal carotid artery. By peak systolic velocity criteria, the estimated stenosis is less than 50%. LEFT VERTEBRAL ARTERY:  Patent with antegrade flow. IMPRESSION: 1. Mild (1-49%) stenosis proximal right internal carotid artery secondary to mild heterogeneous atherosclerotic plaque. 2. Mild (1-49%) stenosis proximal left internal carotid artery secondary to mild heterogeneous atherosclerotic plaque. 3. The vertebral arteries are patent with normal antegrade flow. Signed, HCriselda Peaches MD, RChicago HeightsVascular and Interventional Radiology Specialists GClarke [facility] End[facility] CenterRadiology Electronically Signed   By: HJacqulynn CadetM.D.   On: 11/24/2019 15:37   IR Perc Cholecystostomy  Result Date: 12/02/2019 INDICATION: 59year old with acute cholecystitis. Patient has respiratory distress. Patient is not a surgical candidate at this time. EXAM: IMAGE GUIDED CHOLECYSTOSTOMY TUBE PLACEMENT MEDICATIONS: Moderate sedation ANESTHESIA/SEDATION: Moderate (conscious) sedation was employed during this procedure. A total of Versed 0.5 mg and Fentanyl 50 mcg was administered intravenously. Moderate Sedation Time: 15  minutes. The patient's level of  consciousness and vital signs were monitored continuously by radiology nursing throughout the procedure under my direct supervision. FLUOROSCOPY TIME:  None COMPLICATIONS: None immediate. PROCEDURE: Informed consent was obtained for cholecystostomy tube placement. A time-out was performed prior to the procedure. Ultrasound was used to identify the gallbladder. The right abdomen was prepped and draped in sterile fashion. Maximal barrier sterile technique was utilized including caps, mask, sterile gowns, sterile gloves, sterile drape, hand hygiene and skin antiseptic. Skin was anesthetized with 1% lidocaine. Using ultrasound guidance, an 18 gauge trocar needle was directed into the gallbladder from a transhepatic approach. Needle position confirmed within the gallbladder. Wire was advanced into the gallbladder under ultrasound guidance. The tract was dilated to accommodate a 10 [place] multipurpose drain. 115 mL black bilious fluid was removed. A sample of the fluid was sent for culture. Catheter was sutured to skin and attached to a gravity bag. Ultrasound images were taken and saved for this procedure. FINDINGS: Small amount of perihepatic ascites. 115 mL of dark bile was removed. Gallbladder was decompressed after drain placement. IMPRESSION: Successful placement of a percutaneous cholecystostomy tube with ultrasound guidance. Electronically Signed   By: Markus Daft M.D.   On: 12/02/2019 20:36   DG Chest Port 1 View  Result Date: 18-Dec-2019 CLINICAL DATA:  Check central line placement EXAM: PORTABLE CHEST 1 VIEW COMPARISON:  Film from earlier in the same day. FINDINGS: Endotracheal tube and gastric catheter are again seen and stable. New left jugular central line is noted at the cavoatrial junction. No pneumothorax is noted. Diffuse bilateral airspace opacities are again identified stable in appearance from the prior exam. Cardiac shadow is stable as are postsurgical changes. IMPRESSION: No pneumothorax following  central line placement. The remainder of the study is stable from the earlier film. Electronically Signed   By: Inez Catalina M.D.   On: 18-Dec-2019 14:38   DG Chest Port 1 View  Result Date: December 18, 2019 CLINICAL DATA:  Check endotracheal tube placement EXAM: PORTABLE CHEST 1 VIEW COMPARISON:  Dec 18, 2019 FINDINGS: Cardiac shadow is enlarged but stable. Postsurgical changes are again seen. Endotracheal tube and gastric catheter are noted in satisfactory position. Parenchymal opacities are noted bilaterally consistent with multifocal pneumonia stable in appearance from the prior exam. No bony abnormality is seen. IMPRESSION: Tubes and lines as described in satisfactory position. Stable multifocal pneumonia. Electronically Signed   By: Inez Catalina M.D.   On: 12-18-19 10:44   DG Chest Port 1 View  Result Date: 12/18/2019 CLINICAL DATA:  Acute respiratory failure EXAM: PORTABLE CHEST 1 VIEW COMPARISON:  12/02/2019 FINDINGS: Multifocal patchy opacities, with relative sparing of the right upper lobe, raising concern for multifocal infection. Asymmetric interstitial edema is technically possible but considered less likely. Possible small bilateral pleural effusions, although equivocal. This appearance is unchanged when compared to the recent prior. No pneumothorax. The heart is normal in size. Median sternotomy. IMPRESSION: Multifocal patchy opacities, favoring multifocal infection over interstitial edema, unchanged. Electronically Signed   By: Julian Hy M.D.   On: 12/18/19 05:45   DG Chest Port 1 View  Result Date: 12/02/2019 CLINICAL DATA:  Acute stroke, tachycardia, leukocytosis, heart failure EXAM: PORTABLE CHEST 1 VIEW COMPARISON:  11/28/2019 FINDINGS: No significant change in AP portable examination with diffuse bilateral interstitial and heterogeneous pulmonary opacity, with possible small layering pleural effusions. No new or focal airspace opacity. Cardiomegaly status post median sternotomy  and CABG. IMPRESSION: No significant change in AP portable examination with diffuse bilateral  interstitial and heterogeneous pulmonary opacity, with possible small layering pleural effusions, findings consistent with multifocal infection or edema. No new or focal airspace opacity. Electronically Signed   By: Eddie Candle M.D.   On: 12/02/2019 12:33   DG Chest Port 1 View  Result Date: 12/02/2019 CLINICAL DATA:  Acute respiratory distress. EXAM: PORTABLE CHEST 1 VIEW COMPARISON:  Nov 25, 2019 FINDINGS: There are worsening airspace opacities bilaterally. There is cardiomegaly. The patient is status post prior median sternotomy. There are small bilateral pleural effusions. There is no pneumothorax. The lung volumes are low. IMPRESSION: Cardiomegaly with worsening bilateral airspace opacities which may represent worsening congestive heart failure or multifocal pneumonia. Electronically Signed   By: Constance Holster M.D.   On: 12/07/2019 21:13   MR ABDOMEN MRCP W WO CONTAST  Result Date: 12/01/2019 CLINICAL DATA:  Cholecystitis, evaluate CBD EXAM: MRI ABDOMEN WITHOUT AND WITH CONTRAST (INCLUDING MRCP) TECHNIQUE: Multiplanar multisequence MR imaging of the abdomen was performed both before and after the administration of intravenous contrast. Heavily T2-weighted images of the biliary and pancreatic ducts were obtained, and three-dimensional MRCP images were rendered by post processing. CONTRAST:  64m GADAVIST GADOBUTROL 1 MMOL/ML IV SOLN COMPARISON:  CT abdomen pelvis, 11/30/2019 FINDINGS: Lower chest: Moderate bilateral pleural effusions and associated atelectasis or consolidation of the bilateral lung bases. Cardiomegaly. Hepatobiliary: No mass or other parenchymal abnormality identified. The gallbladder is distended and sludge filled, with gallbladder wall thickening and pericholecystic fluid. There is no biliary ductal dilatation. Central common bile duct is poorly visualized, likely due to a combination of  breath motion artifact and edema. Pancreas: No mass, inflammatory changes, or other parenchymal abnormality identified. Pancreatic duct is nondilated. Spleen:  Within normal limits in size and appearance. Adrenals/Urinary Tract: No masses identified. No evidence of hydronephrosis. Stomach/Bowel: Visualized portions within the abdomen are unremarkable. Vascular/Lymphatic: No pathologically enlarged lymph nodes identified. No abdominal aortic aneurysm demonstrated. Other:  Small volume perihepatic ascites. Musculoskeletal: No suspicious bone lesions identified. IMPRESSION: 1. Distended and sludge filled gallbladder with gallbladder wall thickening and pericholecystic fluid. Findings are consistent with acute cholecystitis and in keeping with prior CT. 2. No biliary ductal dilatation. Central common bile duct is poorly visualized, likely due to a combination of breath motion artifact and edema. No obvious obstructing calculus or other lesion. 3. Small volume perihepatic ascites. 4. Moderate bilateral pleural effusions and associated atelectasis or consolidation of the bilateral lung bases. Electronically Signed   By: AEddie CandleM.D.   On: 12/01/2019 08:11   ECHOCARDIOGRAM COMPLETE  Result Date: 11/24/2019    ECHOCARDIOGRAM REPORT   [facility] Date of Exam: 11/24/2019 Medical Rec #:  [id number]      Height:       67.0 in Accession #:    [id number]     Weight:       191.4 lb Date of Birth:  431-Oct-1962       BSA:          1.985 m Patient Age:    89years        BP:           152/75 mmHg Patient Gender: M               HR:           67 bpm. Exam Location:  [facility] Procedure: 2D Echo, Cardiac Doppler and Color Doppler Indications:     Stroke 434.91  History:  Patient has prior history of Echocardiogram examinations, most                  recent 10/26/2019. TIA and COPD; Risk Factors:Diabetes and Sleep                  Apnea.  Sonographer:     Sherrie Sport RDCS (AE) Referring Phys:  [id number]  Suann Larry DANFORD Diagnosing Phys: Ida Rogue MD  Sonographer Comments: Suboptimal apical window. IMPRESSIONS  1. Left ventricular ejection fraction, by estimation, is 55 to 60%. The left ventricle has normal function. The left ventricle has no regional wall motion abnormalities. There is mild left ventricular hypertrophy. Left ventricular diastolic parameters are indeterminate.  2. Right ventricular systolic function is normal. The right ventricular size is normal. There is normal pulmonary artery systolic pressure.  3. Left atrial size was moderately dilated.  4. Mild mitral valve regurgitation.  5. Images suggestive of left pleural effusion FINDINGS  Left Ventricle: Left ventricular ejection fraction, by estimation, is 55 to 60%. The left ventricle has normal function. The left ventricle has no regional wall motion abnormalities. The left ventricular internal cavity size was normal in size. There is  mild left ventricular hypertrophy. Left ventricular diastolic parameters are indeterminate. Right Ventricle: The right ventricular size is normal. No increase in right ventricular wall thickness. Right ventricular systolic function is normal. There is normal pulmonary artery systolic pressure. The tricuspid regurgitant velocity is 2.44 m/s, and  with an assumed right atrial pressure of 10 mmHg, the estimated right ventricular systolic pressure is 37.8 mmHg. Left Atrium: Left atrial size was moderately dilated. Right Atrium: Right atrial size was normal in size. Pericardium: A small pericardial effusion is present. Mitral Valve: The mitral valve is normal in structure. Normal mobility of the mitral valve leaflets. Mild mitral valve regurgitation. No evidence of mitral valve stenosis. Tricuspid Valve: The tricuspid valve is normal in structure. Tricuspid valve regurgitation is not demonstrated. No evidence of tricuspid stenosis. Aortic Valve: The aortic valve is normal in structure. Aortic valve regurgitation  is not visualized. No aortic stenosis is present. Aortic valve mean gradient measures 5.7 mmHg. Aortic valve peak gradient measures 11.4 mmHg. Aortic valve area, by VTI measures 1.68 cm. Pulmonic Valve: The pulmonic valve was normal in structure. Pulmonic valve regurgitation is not visualized. No evidence of pulmonic stenosis. Aorta: The aortic root is normal in size and structure. Venous: The inferior vena cava is normal in size with greater than 50% respiratory variability, suggesting right atrial pressure of 3 mmHg. IAS/Shunts: No atrial level shunt detected by color flow Doppler.  LEFT VENTRICLE PLAX 2D LVIDd:         3.38 cm LVIDs:         2.28 cm LV PW:         1.19 cm LV IVS:        0.97 cm LVOT diam:     2.00 cm LV SV:         50 LV SV Index:   25 LVOT Area:     3.14 cm  RIGHT VENTRICLE RV Basal diam:  3.36 cm RV S prime:     10.00 cm/s TAPSE (M-mode): 3.2 cm LEFT ATRIUM             Index       RIGHT ATRIUM           Index LA diam:        5.30 cm 2.67 cm/m  RA  Area:     21.10 cm LA Vol (A2C):   77.4 ml 39.00 ml/m RA Volume:   61.70 ml  31.09 ml/m LA Vol (A4C):   82.1 ml 41.37 ml/m LA Biplane Vol: 80.1 ml 40.36 ml/m  AORTIC VALVE                    PULMONIC VALVE AV Area (Vmax):    1.88 cm     PV Vmax:        0.69 m/s AV Area (Vmean):   1.84 cm     PV Peak grad:   1.9 mmHg AV Area (VTI):     1.68 cm     RVOT Peak grad: 2 mmHg AV Vmax:           168.67 cm/s AV Vmean:          111.333 cm/s AV VTI:            0.299 m AV Peak Grad:      11.4 mmHg AV Mean Grad:      5.7 mmHg LVOT Vmax:         101.00 cm/s LVOT Vmean:        65.200 cm/s LVOT VTI:          0.160 m LVOT/AV VTI ratio: 0.53  AORTA Ao Root diam: 2.90 cm MITRAL VALVE                TRICUSPID VALVE MV Area (PHT): 4.77 cm     TR Peak grad:   23.8 mmHg MV Decel Time: 159 msec     TR Vmax:        244.00 cm/s MV E velocity: 131.00 cm/s                             SHUNTS                             Systemic VTI:  0.16 m                              Systemic Diam: 2.00 cm Ida Rogue MD Electronically signed by Ida Rogue MD Signature Date/Time: 11/24/2019/2:50:13 PM    Final    ECHO TEE  Result Date: 11/28/2019    TRANSESOPHOGEAL ECHO REPORT   Patient Name:   Larry Terrell Date of Exam: 12/09/2019 Medical Rec #:  [id number]       Height:       67.0 in Accession #:    [id number]      Weight:       184.0 lb Date of Birth:  07-24-1960        BSA:          1.952 m Patient Age:    45 years        BP:           163/74 mmHg Patient Gender: M               HR:           85 bpm. Exam Location:  [facility] Procedure: Transesophageal Echo, Cardiac Doppler and Color Doppler Indications:     I63.9 Stroke  History:         Patient has prior history of Echocardiogram examinations, most  recent 11/24/2019. CHF, TIA, PVD and COPD; Risk Factors:Sleep                  Apnea and Diabetes.  Sonographer:     Charmayne Sheer RDCS (AE) Referring Phys:  [id number] Arvil Chaco Diagnosing Phys: Kate Sable MD PROCEDURE: The transesophogeal probe was passed without difficulty through the esophogus of the patient. Sedation performed by performing physician. The patient developed no complications during the procedure. IMPRESSIONS  1. Left ventricular ejection fraction, by estimation, is 55 to 60%. The left ventricle has normal function. The left ventricle has no regional wall motion abnormalities.  2. Right ventricular systolic function is normal. The right ventricular size is normal.  3. Left atrial size was moderately dilated. No left atrial/left atrial appendage thrombus was detected.  4. Right atrial size was mildly dilated.  5. The mitral valve is normal in structure. Mild mitral valve regurgitation.  6. Aortic valve lambl's excrescence noted. The aortic valve is tricuspid. Aortic valve regurgitation is trivial. Conclusion(s)/Recommendation(s): No LA/LAA thrombus identified. No intracardiac source of embolism detected on this on this transesophageal  echocardiogram. FINDINGS  Left Ventricle: Left ventricular ejection fraction, by estimation, is 55 to 60%. The left ventricle has normal function. The left ventricle has no regional wall motion abnormalities. The left ventricular internal cavity size was normal in size. There is  no left ventricular hypertrophy. Right Ventricle: The right ventricular size is normal. No increase in right ventricular wall thickness. Right ventricular systolic function is normal. Left Atrium: Left atrial size was moderately dilated. No left atrial/left atrial appendage thrombus was detected. Right Atrium: Right atrial size was mildly dilated. Pericardium: There is no evidence of pericardial effusion. Mitral Valve: The mitral valve is normal in structure. Mild mitral valve regurgitation. Tricuspid Valve: The tricuspid valve is not well visualized. Tricuspid valve regurgitation is mild. Aortic Valve: Aortic valve lambl's excrescence noted. The aortic valve is tricuspid. Aortic valve regurgitation is trivial. Pulmonic Valve: The pulmonic valve was normal in structure. Pulmonic valve regurgitation is trivial. Aorta: The aortic root is normal in size and structure. Venous: The inferior vena cava was not well visualized. IAS/Shunts: No atrial level shunt detected by color flow Doppler. Kate Sable MD Electronically signed by Kate Sable MD Signature Date/Time: 11/14/2019/11:59:04 AM    Final    US Abdomen Limited RUQ  Result Date: 11/30/2019 CLINICAL DATA:  Pain. EXAM: ULTRASOUND ABDOMEN LIMITED RIGHT UPPER QUADRANT COMPARISON:  Nov 29, 2015 ultrasound. FINDINGS: Gallbladder: The gallbladder is distended. There is borderline gallbladder wall thickening. The sonographic Percell Miller sign is negative. There is gallbladder sludge. Common bile duct: Diameter: 3 mm Liver: The liver is nodular with a coarsened hepatic echotexture. Portal vein is patent on color Doppler imaging with normal direction of blood flow towards the liver. Other:  There is a trace amount of ascites. There is a right-sided pleural effusion. IMPRESSION: 1. Distended gallbladder sludge without definite sonographic evidence for acute cholecystitis. 2. Cirrhotic appearing liver. 3. Small volume abdominal ascites. 4. Incidentally noted right-sided pleural effusion. Electronically Signed   By: Constance Holster M.D.   On: 11/30/2019 19:02    Microbiology Recent Results (from the past 240 hour(s))  Culture, blood (x 2)     Status: None   Collection Time: 12/02/2019  8:54 PM   Specimen: BLOOD  Result Value Ref Range Status   Specimen Description BLOOD RAC  Final   Special Requests BOTTLES DRAWN AEROBIC AND ANAEROBIC BCAV  Final   Culture   Final  NO GROWTH 5 DAYS Performed at [facility], [address] [place], [place] [address]    Report Status 11/28/2019 FINAL  Final  Culture, blood (x 2)     Status: None   Collection Time: 11/24/2019  8:54 PM   Specimen: BLOOD  Result Value Ref Range Status   Specimen Description BLOOD BRH  Final   Special Requests BOTTLES DRAWN AEROBIC AND ANAEROBIC BCAV  Final   Culture   Final    NO GROWTH 5 DAYS Performed at [facility], [address], [place], [place] [address]    Report Status 11/28/2019 FINAL  Final  SARS CORONAVIRUS 2 (TAT 6-24 HRS) Nasopharyngeal Nasopharyngeal Swab     Status: None   Collection Time: 11/28/19 11:39 AM   Specimen: Nasopharyngeal Swab  Result Value Ref Range Status   SARS Coronavirus 2 NEGATIVE NEGATIVE Final    Comment: (NOTE) SARS-CoV-2 target nucleic acids are NOT DETECTED. The SARS-CoV-2 RNA is generally detectable in upper and lower respiratory specimens during the acute phase of infection. Negative results do not preclude SARS-CoV-2 infection, do not rule out co-infections with other pathogens, and should not be used as the sole basis for treatment or other patient management decisions. Negative results must be combined with clinical observations, patient  history, and epidemiological information. The expected result is Negative. Fact Sheet for Patients: SugarRoll.be Fact Sheet for Healthcare Providers: https://www.woods-mathews.com/ This test is not yet approved or cleared by the [place] FDA and  has been authorized for detection and/or diagnosis of SARS-CoV-2 by FDA under an Emergency Use Authorization (EUA). This EUA will remain  in effect (meaning this test can be used) for the duration of the COVID-19 declaration under Section 56 4(b)(1) of the Act, 21 U.S.C. section 360bbb-3(b)(1), unless the authorization is terminated or revoked sooner. Performed at [facility], [place] [address], [place], [place] [address]   CULTURE, BLOOD (ROUTINE X 2) w Reflex to ID Panel     Status: None (Preliminary result)   Collection Time: 11/26/2019 11:28 PM   Specimen: BLOOD RIGHT HAND  Result Value Ref Range Status   Specimen Description BLOOD RIGHT HAND  Final   Special Requests   Final    BOTTLES DRAWN AEROBIC AND ANAEROBIC Blood Culture adequate volume   Culture   Final    NO GROWTH 4 DAYS Performed at [facility], [address], [place], [place] [address]    Report Status PENDING  Incomplete  CULTURE, BLOOD (ROUTINE X 2) w Reflex to ID Panel     Status: None (Preliminary result)   Collection Time: 11/28/2019 11:38 PM   Specimen: BLOOD LEFT HAND  Result Value Ref Range Status   Specimen Description BLOOD LEFT HAND  Final   Special Requests   Final    BOTTLES DRAWN AEROBIC AND ANAEROBIC Blood Culture adequate volume   Culture   Final    NO GROWTH 4 DAYS Performed at [facility], [address], [place], [place] [address]    Report Status PENDING  Incomplete  MRSA PCR Screening     Status: None   Collection Time: 11/30/19 11:00 AM   Specimen: Nasal Mucosa; Nasopharyngeal  Result Value Ref Range Status   MRSA by PCR NEGATIVE NEGATIVE Final    Comment:        The  GeneXpert MRSA Assay (FDA approved for NASAL specimens only), is one component of a comprehensive MRSA colonization surveillance program. It is not intended to diagnose MRSA  infection nor to guide or monitor treatment for MRSA infections. Performed at [facility], [address] [place], [place] [address]   Aerobic/Anaerobic Culture (surgical/deep wound)     Status: None (Preliminary result)   Collection Time: 12/02/19 11:28 AM   Specimen: BILE  Result Value Ref Range Status   Specimen Description   Final    BILE Performed at [facility], [address], [place], [place] [address]    Special Requests   Final    NONE Performed at [facility], [address] [place], [place] [address]    Gram Stain   Final    FEW WBC PRESENT, PREDOMINANTLY PMN NO ORGANISMS SEEN    Culture   Final    NO GROWTH < 24 HOURS Performed at [facility], [place] [address], [place], [place] [address]    Report Status PENDING  Incomplete    Lab Basic Metabolic Panel: Recent Labs  Lab 11/30/19 2105 11/30/19 2107 12/01/19 0603 12/02/19 0508 12/02/19 1822 12-30-2019 0437 30-Dec-2019 1237  NA   < >  --  142 143 142 145 147*  K   < >  --  4.1 3.5 3.4* 3.4* 4.0  CL   < >  --  96* 96* 96* 97* 103  CO2   < >  --  32 34* 34* 32 29  GLUCOSE   < >  --  233* 169* 145* 123* 174*  BUN   < >  --  39* 40* 42* 46* 51*  CREATININE   < >  --  1.49* 1.70* 2.01* 1.97* 2.19*  CALCIUM   < >  --  8.8* 8.6* 8.5* 8.5* 8.0*  MG  --  1.9  --   --   --  2.4  --   PHOS  --   --   --   --   --  3.4  --    < > = values in this interval not displayed.   Liver Function Tests: Recent Labs  Lab 11/30/19 2105 12/01/19 0603 12/02/19 0508 12/02/19 1822 2019/12/30 0437  AST 504* 311* 137* 108* 95*  ALT 287* 236* 142* 119* 102*  ALKPHOS 761* 740* 500* 537* 494*  BILITOT 12.5* 9.6* 5.9* 3.3* 3.1*  PROT 7.0 6.9 6.7 6.9 7.1  ALBUMIN 2.6* 2.5* 2.5* 2.4* 2.5*   No results for input(s):  LIPASE, AMYLASE in the last 168 hours. Recent Labs  Lab 12/02/19 1822  AMMONIA 33   CBC: Recent Labs  Lab 12/01/19 0603 12/02/19 0508 12/02/19 1822 12-30-19 0437 12/30/2019 1452  WBC 25.9* 14.0* 13.4* 14.0* 22.0*  NEUTROABS  --   --  12.2*  --  15.5*  HGB 10.4* 8.6* 8.8* 8.8* 8.7*  HCT 34.1* 27.9* 29.1* 28.9* 30.9*  MCV 94.7 94.6 94.8 97.3 103.0*  PLT 232 161 197 187 237   Cardiac Enzymes: No results for input(s): CKTOTAL, CKMB, CKMBINDEX, TROPONINI in the last 168 hours. Sepsis Labs: Recent Labs  Lab 11/28/2019 2111 11/21/2019 2111 11/18/2019 2327 11/30/19 0241 12/01/19 0603 12/02/19 0508 12/02/19 1822 2019-12-30 0437 12-30-2019 1452  PROCALCITON 0.12  --   --   --   --  1.60  --   --   --   WBC 15.8*   < >  --  17.8*   < > 14.0* 13.4* 14.0* 22.0*  LATICACIDVEN  --   --  1.1 1.2  --   --   --   --   --    < > =  values in this interval not displayed.    Procedures/Operations   TEE 11/30/2019  Percutaneous cholecystostomy tube 12/02/2019  Endotracheal intubation 01-01-2020  Mechanical ventilation January 01, 2020  Left IJ central line placement 01-01-20  Insertion of temporary Foley catheter per urology 01-Jan-2020   C. Derrill Kay, MD [place] 01/01/2020, 5:13 PM    *This note was dictated using voice recognition software/Dragon.  Despite best efforts to proofread, errors can occur which can change the meaning.  Any change was purely unintentional.

## 2019-12-12 NOTE — Progress Notes (Signed)
Code blue called.  Family at bedside at time.  Patient unable to sustain.  Pronounced at 1526.  Family remains at bedside.

## 2019-12-12 NOTE — Progress Notes (Addendum)
Follow up - Critical Care Medicine Note  Patient Details:    Larry Terrell is an 59 y.o. male very complex remote former smoker, with multiple comorbidities admitted to [facility] on 23 Nov 2019 after a fall in his skilled nursing facility.  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  He has had a very complex hospital course with LEFT PCA CVA, acute cholecystitis with severe sepsis and now bilateral pulmonary infiltrates with acute on chronic respiratory failure.  Underwent percutaneous cholecystostomy placement 5/22.  Has been BiPAP dependent pretty much since admit.  Lines, Airways, Drains: Airway 8 mm (Active)  Secured at (cm) 24 cm Dec 07, 2019 0957  Measured From Lips 12/07/19 0957  Secured Location Right Dec 07, 2019 0957  Secured By [employer] 12/07/19 0957  Cuff Pressure (cm H2O) 30 cm H2O Dec 07, 2019 0957  Site Condition Dry 12-07-19 0957     Biliary Tube Cook slip-coat 10.2 Fr. RUQ (Active)  Site Description Unremarkable 12-07-2019 0600  Dressing Status Other (Comment) 12-07-2019 0600  Drainage Color Black Dec 07, 2019 0600  Tube Status Open to gravity drainage 12/07/19 0600  Output (mL) 70 mL 12/02/19 1803     External Urinary Catheter (Active)  Collection Container Standard drainage bag 2019/12/07 0415  Securement Method Leg strap Dec 07, 2019 0415  Site Assessment Clean;Intact 07-Dec-2019 0415  Intervention Equipment Changed 11/28/19 1944  Output (mL) 750 mL 12-07-19 0536    Anti-infectives:  Anti-infectives (From admission, onward)   Start     Dose/Rate Route Frequency Ordered Stop   12/01/19 0000  piperacillin-tazobactam (ZOSYN) IVPB 3.375 g     3.375 g 12.5 mL/hr over 240 Minutes Intravenous Every 8 hours 11/30/19 2105     11/30/19 1100  vancomycin (VANCOCIN) IVPB 1000 mg/200 mL premix  Status:  Discontinued     1,000 mg 200 mL/hr over 60 Minutes Intravenous Every 12 hours 11/30/19  0303 11/30/19 1314   11/30/19 1000  ceFEPIme (MAXIPIME) 2 g in sodium chloride 0.9 % 100 mL IVPB  Status:  Discontinued     2 g 200 mL/hr over 30 Minutes Intravenous Every 8 hours 11/30/19 0259 11/30/19 2105   11/30/19 0115  vancomycin (VANCOREADY) IVPB 1750 mg/350 mL     1,750 mg 175 mL/hr over 120 Minutes Intravenous  Once 11/30/19 0105 11/30/19 0415   12/04/2019 2230  ceFEPIme (MAXIPIME) 1 g in sodium chloride 0.9 % 100 mL IVPB  Status:  Discontinued     1 g 200 mL/hr over 30 Minutes Intravenous Every 8 hours 11/28/2019 2218 11/30/19 0259   11/21/2019 2000  cefTRIAXone (ROCEPHIN) 2 g in sodium chloride 0.9 % 100 mL IVPB  Status:  Discontinued     2 g 200 mL/hr over 30 Minutes Intravenous Every 24 hours 12/09/2019 1953 11/24/19 0809   11/21/2019 2000  azithromycin (ZITHROMAX) 500 mg in sodium chloride 0.9 % 250 mL IVPB  Status:  Discontinued     500 mg 250 mL/hr over 60 Minutes Intravenous Every 24 hours 11/22/2019 1953 11/24/19 0809      Microbiology: Results for orders placed or performed during the hospital encounter of 11/25/2019  SARS Coronavirus 2 by RT PCR (hospital order, performed in [facility] hospital lab) Nasopharyngeal Nasopharyngeal Swab     Status: None   Collection Time: 12/09/2019  2:05 PM   Specimen: Nasopharyngeal Swab  Result Value Ref Range Status   SARS Coronavirus 2 NEGATIVE NEGATIVE Final  Comment: (NOTE) SARS-CoV-2 target nucleic acids are NOT DETECTED. The SARS-CoV-2 RNA is generally detectable in upper and lower respiratory specimens during the acute phase of infection. The lowest concentration of SARS-CoV-2 viral copies this assay can detect is 250 copies / mL. A negative result does not preclude SARS-CoV-2 infection and should not be used as the sole basis for treatment or other patient management decisions.  A negative result may occur with improper specimen collection / handling, submission of specimen other than nasopharyngeal swab, presence of viral  mutation(s) within the areas targeted by this assay, and inadequate number of viral copies (<250 copies / mL). A negative result must be combined with clinical observations, patient history, and epidemiological information. Fact Sheet for Patients:   StrictlyIdeas.no Fact Sheet for Healthcare Providers: BankingDealers.co.za This test is not yet approved or cleared  by the [place] FDA and has been authorized for detection and/or diagnosis of SARS-CoV-2 by FDA under an Emergency Use Authorization (EUA).  This EUA will remain in effect (meaning this test can be used) for the duration of the COVID-19 declaration under Section 564(b)(1) of the Act, 21 U.S.C. section 360bbb-3(b)(1), unless the authorization is terminated or revoked sooner. Performed at [facility], [address] [place], [place] [address]   Culture, blood (x 2)     Status: None   Collection Time: 11/22/2019  8:54 PM   Specimen: BLOOD  Result Value Ref Range Status   Specimen Description BLOOD RAC  Final   Special Requests BOTTLES DRAWN AEROBIC AND ANAEROBIC BCAV  Final   Culture   Final    NO GROWTH 5 DAYS Performed at [facility], [address], [place], [place] [address]    Report Status 11/28/2019 FINAL  Final  Culture, blood (x 2)     Status: None   Collection Time: 11/21/2019  8:54 PM   Specimen: BLOOD  Result Value Ref Range Status   Specimen Description BLOOD BRH  Final   Special Requests BOTTLES DRAWN AEROBIC AND ANAEROBIC BCAV  Final   Culture   Final    NO GROWTH 5 DAYS Performed at [facility], [address], [place], [place] [address]    Report Status 11/28/2019 FINAL  Final  SARS CORONAVIRUS 2 (TAT 6-24 HRS) Nasopharyngeal Nasopharyngeal Swab     Status: None   Collection Time: 11/28/19 11:39 AM   Specimen: Nasopharyngeal Swab  Result Value Ref Range Status   SARS Coronavirus 2 NEGATIVE NEGATIVE Final    Comment:  (NOTE) SARS-CoV-2 target nucleic acids are NOT DETECTED. The SARS-CoV-2 RNA is generally detectable in upper and lower respiratory specimens during the acute phase of infection. Negative results do not preclude SARS-CoV-2 infection, do not rule out co-infections with other pathogens, and should not be used as the sole basis for treatment or other patient management decisions. Negative results must be combined with clinical observations, patient history, and epidemiological information. The expected result is Negative. Fact Sheet for Patients: SugarRoll.be Fact Sheet for Healthcare Providers: https://www.woods-mathews.com/ This test is not yet approved or cleared by the [place] FDA and  has been authorized for detection and/or diagnosis of SARS-CoV-2 by FDA under an Emergency Use Authorization (EUA). This EUA will remain  in effect (meaning this test can be used) for the duration of the COVID-19 declaration under Section 56 4(b)(1) of the Act, 21 U.S.C. section 360bbb-3(b)(1), unless the authorization is terminated or revoked sooner. Performed at [facility], [place] [address] [place], [place]  [address]   CULTURE, BLOOD (ROUTINE X 2) w Reflex to ID Panel     Status: None (Preliminary result)   Collection Time: 12/04/2019 11:28 PM   Specimen: BLOOD RIGHT HAND  Result Value Ref Range Status   Specimen Description BLOOD RIGHT HAND  Final   Special Requests   Final    BOTTLES DRAWN AEROBIC AND ANAEROBIC Blood Culture adequate volume   Culture   Final    NO GROWTH 4 DAYS Performed at [facility], [address], [place], [place] [address]    Report Status PENDING  Incomplete  CULTURE, BLOOD (ROUTINE X 2) w Reflex to ID Panel     Status: None (Preliminary result)   Collection Time: 12/10/2019 11:38 PM   Specimen: BLOOD LEFT HAND  Result Value Ref Range Status   Specimen Description BLOOD LEFT HAND  Final   Special Requests    Final    BOTTLES DRAWN AEROBIC AND ANAEROBIC Blood Culture adequate volume   Culture   Final    NO GROWTH 4 DAYS Performed at [facility], [address] [place], [place] [address]    Report Status PENDING  Incomplete  MRSA PCR Screening     Status: None   Collection Time: 11/30/19 11:00 AM   Specimen: Nasal Mucosa; Nasopharyngeal  Result Value Ref Range Status   MRSA by PCR NEGATIVE NEGATIVE Final    Comment:        The GeneXpert MRSA Assay (FDA approved for NASAL specimens only), is one component of a comprehensive MRSA colonization surveillance program. It is not intended to diagnose MRSA infection nor to guide or monitor treatment for MRSA infections. Performed at [facility], [address] [place], [place] [address]   Aerobic/Anaerobic Culture (surgical/deep wound)     Status: None (Preliminary result)   Collection Time: 12/02/19 11:28 AM   Specimen: BILE  Result Value Ref Range Status   Specimen Description   Final    BILE Performed at [facility], [address], [place], [place] [address]    Special Requests   Final    NONE Performed at [facility] [address], [address] [place], [place] [address]    Gram Stain   Final    FEW WBC PRESENT, PREDOMINANTLY PMN NO ORGANISMS SEEN Performed at [facility], [place] [address], [place], [place] [address]    Culture PENDING  Incomplete   Report Status PENDING  Incomplete   Results for orders placed or performed during the hospital encounter of 11/15/2019 (from the past 24 hour(s))  Blood gas, arterial     Status: Abnormal   Collection Time: 12/02/19  5:01 PM  Result Value Ref Range   FIO2 100.00    Delivery systems BILEVEL POSITIVE AIRWAY PRESSURE    Inspiratory PAP 12    Expiratory PAP 6    pH, Arterial 7.42 7.350 - 7.450   pCO2 arterial 60 (H) 32.0 - 48.0 mmHg   pO2, Arterial 75 (L) 83.0 - 108.0 mmHg   Bicarbonate 38.9 (H) 20.0 - 28.0 mmol/L   Acid-Base Excess 11.9 (H)  0.0 - 2.0 mmol/L   O2 Saturation 95.1 %   Patient temperature 37.0    Collection site RIGHT BRACHIAL    Sample type ARTERIAL DRAW    Allens test (pass/fail) PASS PASS  Brain natriuretic peptide     Status: Abnormal   Collection Time: 12/02/19  6:22 PM  Result Value Ref Range   B Natriuretic Peptide 295.5 (  H) 0.0 - 100.0 pg/mL  C-reactive protein     Status: Abnormal   Collection Time: 12/02/19  6:22 PM  Result Value Ref Range   CRP 31.2 (H) <1.0 mg/dL  Sedimentation rate     Status: Abnormal   Collection Time: 12/02/19  6:22 PM  Result Value Ref Range   Sed Rate 129 (H) 0 - 20 mm/hr  Hepatic function panel     Status: Abnormal   Collection Time: 12/02/19  6:22 PM  Result Value Ref Range   Total Protein 6.9 6.5 - 8.1 g/dL   Albumin 2.4 (L) 3.5 - 5.0 g/dL   AST 108 (H) 15 - 41 U/L   ALT 119 (H) 0 - 44 U/L   Alkaline Phosphatase 537 (H) 38 - 126 U/L   Total Bilirubin 3.3 (H) 0.3 - 1.2 mg/dL   Bilirubin, Direct 1.8 (H) 0.0 - 0.2 mg/dL   Indirect Bilirubin 1.5 (H) 0.3 - 0.9 mg/dL  Ammonia     Status: None   Collection Time: 12/02/19  6:22 PM  Result Value Ref Range   Ammonia 33 9 - 35 umol/L  CBC with Differential/Platelet     Status: Abnormal   Collection Time: 12/02/19  6:22 PM  Result Value Ref Range   WBC 13.4 (H) 4.0 - 10.5 K/uL   RBC 3.07 (L) 4.22 - 5.81 MIL/uL   Hemoglobin 8.8 (L) 13.0 - 17.0 g/dL   HCT 29.1 (L) 39.0 - 52.0 %   MCV 94.8 80.0 - 100.0 fL   MCH 28.7 26.0 - 34.0 pg   MCHC 30.2 30.0 - 36.0 g/dL   RDW 18.6 (H) 11.5 - 15.5 %   Platelets 197 150 - 400 K/uL   nRBC 0.0 0.0 - 0.2 %   Neutrophils Relative % 91 %   Neutro Abs 12.2 (H) 1.7 - 7.7 K/uL   Lymphocytes Relative 3 %   Lymphs Abs 0.4 (L) 0.7 - 4.0 K/uL   Monocytes Relative 5 %   Monocytes Absolute 0.6 0.1 - 1.0 K/uL   Eosinophils Relative 0 %   Eosinophils Absolute 0.0 0.0 - 0.5 K/uL   Basophils Relative 0 %   Basophils Absolute 0.0 0.0 - 0.1 K/uL   Immature Granulocytes 1 %   Abs Immature  Granulocytes 0.14 (H) 0.00 - 0.07 K/uL  Basic metabolic panel     Status: Abnormal   Collection Time: 12/02/19  6:22 PM  Result Value Ref Range   Sodium 142 135 - 145 mmol/L   Potassium 3.4 (L) 3.5 - 5.1 mmol/L   Chloride 96 (L) 98 - 111 mmol/L   CO2 34 (H) 22 - 32 mmol/L   Glucose, Bld 145 (H) 70 - 99 mg/dL   BUN 42 (H) 8 - 23 mg/dL   Creatinine, Ser 2.01 (H) 0.61 - 1.24 mg/dL   Calcium 8.5 (L) 8.9 - 10.3 mg/dL   GFR calc non Af Amer 35 (L) >60 mL/min   GFR calc Af Amer 40 (L) >60 mL/min   Anion gap 12 5 - 15  Glucose, capillary     Status: Abnormal   Collection Time: 12/02/19  9:54 PM  Result Value Ref Range   Glucose-Capillary 107 (H) 70 - 99 mg/dL  APTT     Status: Abnormal   Collection Time: 12/02/19 10:34 PM  Result Value Ref Range   aPTT 116 (H) 24 - 36 seconds  CBC     Status: Abnormal   Collection Time: 27-Dec-2019  4:37 AM  Result Value Ref Range   WBC 14.0 (H) 4.0 - 10.5 K/uL   RBC 2.97 (L) 4.22 - 5.81 MIL/uL   Hemoglobin 8.8 (L) 13.0 - 17.0 g/dL   HCT 28.9 (L) 39.0 - 52.0 %   MCV 97.3 80.0 - 100.0 fL   MCH 29.6 26.0 - 34.0 pg   MCHC 30.4 30.0 - 36.0 g/dL   RDW 18.5 (H) 11.5 - 15.5 %   Platelets 187 150 - 400 K/uL   nRBC 0.1 0.0 - 0.2 %  Comprehensive metabolic panel     Status: Abnormal   Collection Time: 2019-12-31  4:37 AM  Result Value Ref Range   Sodium 145 135 - 145 mmol/L   Potassium 3.4 (L) 3.5 - 5.1 mmol/L   Chloride 97 (L) 98 - 111 mmol/L   CO2 32 22 - 32 mmol/L   Glucose, Bld 123 (H) 70 - 99 mg/dL   BUN 46 (H) 8 - 23 mg/dL   Creatinine, Ser 1.97 (H) 0.61 - 1.24 mg/dL   Calcium 8.5 (L) 8.9 - 10.3 mg/dL   Total Protein 7.1 6.5 - 8.1 g/dL   Albumin 2.5 (L) 3.5 - 5.0 g/dL   AST 95 (H) 15 - 41 U/L   ALT 102 (H) 0 - 44 U/L   Alkaline Phosphatase 494 (H) 38 - 126 U/L   Total Bilirubin 3.1 (H) 0.3 - 1.2 mg/dL   GFR calc non Af Amer 36 (L) >60 mL/min   GFR calc Af Amer 41 (L) >60 mL/min   Anion gap 16 (H) 5 - 15  Heparin level (unfractionated)      Status: Abnormal   Collection Time: 2019-12-31  4:37 AM  Result Value Ref Range   Heparin Unfractionated 3.08 (H) 0.30 - 0.70 IU/mL  Magnesium     Status: None   Collection Time: 12-31-2019  4:37 AM  Result Value Ref Range   Magnesium 2.4 1.7 - 2.4 mg/dL  Phosphorus     Status: None   Collection Time: 2019-12-31  4:37 AM  Result Value Ref Range   Phosphorus 3.4 2.5 - 4.6 mg/dL  APTT     Status: Abnormal   Collection Time: Dec 31, 2019  4:37 AM  Result Value Ref Range   aPTT 155 (H) 24 - 36 seconds  Glucose, capillary     Status: Abnormal   Collection Time: December 31, 2019  8:53 AM  Result Value Ref Range   Glucose-Capillary 156 (H) 70 - 99 mg/dL  Blood gas, arterial     Status: Abnormal   Collection Time: 31-Dec-2019  9:07 AM  Result Value Ref Range   FIO2 1.00    Delivery systems BILEVEL POSITIVE AIRWAY PRESSURE    pH, Arterial 7.44 7.350 - 7.450   pCO2 arterial 52 (H) 32.0 - 48.0 mmHg   pO2, Arterial 74 (L) 83.0 - 108.0 mmHg   Bicarbonate 35.3 (H) 20.0 - 28.0 mmol/L   Acid-Base Excess 9.4 (H) 0.0 - 2.0 mmol/L   O2 Saturation 95.2 %   Patient temperature 37.0    Collection site RIGHT BRACHIAL    Sample type ARTERIAL DRAW    Allens test (pass/fail) PASS PASS  Triglycerides     Status: None   Collection Time: Dec 31, 2019  9:57 AM  Result Value Ref Range   Triglycerides 76 <150 mg/dL  Blood gas, arterial     Status: Abnormal   Collection Time: 2019-12-31 11:34 AM  Result Value Ref Range   FIO2 1.00    Delivery systems VENTILATOR  Mode PRESSURE REGULATED VOLUME CONTROL    VT 400 mL   LHR 28 resp/min   Peep/cpap 12.0 cm H20   pH, Arterial 7.33 (L) 7.350 - 7.450   pCO2 arterial 57 (H) 32.0 - 48.0 mmHg   pO2, Arterial 104 83.0 - 108.0 mmHg   Bicarbonate 30.1 (H) 20.0 - 28.0 mmol/L   Acid-Base Excess 2.8 (H) 0.0 - 2.0 mmol/L   O2 Saturation 97.6 %   Patient temperature 37.0    Collection site RIGHT BRACHIAL    Sample type ARTERIAL DRAW    Allens test (pass/fail) PASS PASS  Glucose,  capillary     Status: Abnormal   Collection Time: 12-25-2019 11:41 AM  Result Value Ref Range   Glucose-Capillary 127 (H) 70 - 99 mg/dL  APTT     Status: Abnormal   Collection Time: December 25, 2019 12:37 PM  Result Value Ref Range   aPTT 73 (H) 24 - 36 seconds  Basic metabolic panel     Status: Abnormal   Collection Time: 12/25/2019 12:37 PM  Result Value Ref Range   Sodium 147 (H) 135 - 145 mmol/L   Potassium 4.0 3.5 - 5.1 mmol/L   Chloride 103 98 - 111 mmol/L   CO2 29 22 - 32 mmol/L   Glucose, Bld 174 (H) 70 - 99 mg/dL   BUN 51 (H) 8 - 23 mg/dL   Creatinine, Ser 2.19 (H) 0.61 - 1.24 mg/dL   Calcium 8.0 (L) 8.9 - 10.3 mg/dL   GFR calc non Af Amer 31 (L) >60 mL/min   GFR calc Af Amer 36 (L) >60 mL/min   Anion gap 15 5 - 15        Best Practice/Protocols:  VTE Prophylaxis: Heparin (drip) GI Prophylaxis: Proton Pump Inhibitor Hyperglycemia (ICU)  Events: 5/13: Admitted with acute on chronic respiratory failure status post fall at SNF, chest x-ray consistent with possible congestive heart failure and pulmonary edema. 5/14: LEFT PCA CVA by MRI, acute 5/15: Progressive bilateral interstitial and alveolar opacities, pulmonary edema versus multifocal pneumonia 5/19: Worsening chest x-ray with bilateral infiltrates from above 5/20: Abdominal pain, acute cholecystitis, not surgical candidate 5/21: MRCP firms acute cholecystitis 5/22: Percutaneous cholecystostomy drain placed at bedside, PCCM consulted 5/23: Worsening radiographic picture, worsening oxygenation on BiPAP, intubated   Studies: EEG  Result Date: 11/24/2019 Alexis Goodell, MD     11/24/2019  4:44 PM ELECTROENCEPHALOGRAM REPORT Patient: Evonnie Dawes       Room #: 249A-AA EEG No. ID: [id number] Age: 59 y.o.        Sex: male Requesting Physician: Danford Report Date:  11/24/2019       Interpreting Physician: Alexis Goodell History: Eoin Willden is an 59 y.o. male with altered mental status Medications: Amiodarone, Eliquis, ASA,  Insulin, Lasix, Keppra, Lopressor, Lyrica, Effexor, Zetia, Lipitor Conditions of Recording:  This is a 21 channel routine scalp EEG performed with bipolar and monopolar montages arranged in accordance to the international 10/20 system of electrode placement. One channel was dedicated to EKG recording. The patient is in the awake state. Description:  The background activity is slow and poorly organized.  It consists of low voltage activity in the delta-theta continuum.  This activity is diffusely distributed and continuous throughout the recording.  No epileptiform activity is noted.  Hyperventilation was not performed. Intermittent photic stimulation was performed but failed to illicit any change in the tracing. IMPRESSION: This is an abnormal EEG secondary to general background slowing.  This finding may be seen  with a diffuse disturbance that is etiologically nonspecific, but may include a metabolic encephalopathy, among other possibilities.  No epileptiform activity was noted.  Alexis Goodell, MD Neurology [phone] 11/24/2019, 4:41 PM   DG Chest 2 View  Result Date: 11/25/2019 CLINICAL DATA:  Pleural effusion EXAM: CHEST - 2 VIEW COMPARISON:  12/02/2019 FINDINGS: Post CABG changes. Stable cardiomegaly. Progressive interstitial and alveolar opacities throughout both lungs. Small to moderate bilateral pleural effusions. No pneumothorax. IMPRESSION: Progressive bilateral interstitial and alveolar opacities, which may reflect pulmonary edema versus multifocal pneumonia. Small to moderate bilateral pleural effusions. Electronically Signed   By: Davina Poke D.O.   On: 11/25/2019 17:02   DG Chest 2 View  Result Date: 11/12/2019 CLINICAL DATA:  Shortness of breath, fall EXAM: CHEST - 2 VIEW COMPARISON:  11/13/2019 FINDINGS: Prior CABG. Stable cardiomegaly. Diffusely increased interstitial markings throughout both lungs with patchy scattered patchy opacities in the bilateral lung bases and peripheral  aspect of the right upper lobe. No large pleural fluid collection. No pneumothorax. Degenerative changes of the thoracic spine suggesting DISH. IMPRESSION: Findings suggestive of congestive heart failure with pulmonary edema. A superimposed infectious process would be difficult to exclude. Electronically Signed   By: Davina Poke D.O.   On: 11/21/2019 12:37   DG Chest 2 View  Result Date: 11/13/2019 CLINICAL DATA:  Status post recent coronary artery bypass grafting EXAM: CHEST - 2 VIEW COMPARISON:  November 04, 2019 FINDINGS: There is no appreciable edema or airspace opacity. There is cardiomegaly with pulmonary vascularity within normal limits. Patient is status post coronary artery bypass grafting. No adenopathy. There is degenerative change in the thoracic spine. IMPRESSION: Cardiomegaly. Status post coronary artery bypass grafting. No edema or airspace opacity. Electronically Signed   By: Lowella Grip III M.D.   On: 11/13/2019 14:04   DG Chest 2 View  Result Date: 11/04/2019 CLINICAL DATA:  Pleural effusion EXAM: CHEST - 2 VIEW COMPARISON:  November 01, 2019 FINDINGS: The right PICC line is in good position. Cardiomegaly. The hila and mediastinum are normal. No pneumothorax. Mild atelectasis in the right base. No focal infiltrate. No change in the cardiomediastinal silhouette. IMPRESSION: Stable right PICC line. Mild right basilar atelectasis. No other changes. Electronically Signed   By: Dorise Bullion III M.D   On: 11/04/2019 13:47   DG Abd 1 View  Result Date: 12/06/2019 CLINICAL DATA:  Abdominal pain and distention. EXAM: ABDOMEN - 1 VIEW COMPARISON:  None. FINDINGS: The stomach is distended. Bowel gas pattern is otherwise nonobstructive. There is a moderate amount of stool in the colon and rectum. A left common iliac stent is noted. There is no definite pneumatosis or free air. IMPRESSION: Distended stomach, otherwise the bowel gas pattern is nonobstructive. Electronically Signed   By:  Constance Holster M.D.   On: 12/11/2019 21:11   CT HEAD WO CONTRAST  Result Date: 11/16/2019 CLINICAL DATA:  Fall. EXAM: CT HEAD WITHOUT CONTRAST TECHNIQUE: Contiguous axial images were obtained from the base of the skull through the vertex without intravenous contrast. COMPARISON:  MR brain and CT head dated April 01, 2019. FINDINGS: Brain: No evidence of acute infarction, hemorrhage, hydrocephalus, extra-axial collection or mass lesion/mass effect. Stable mild atrophy and chronic microvascular ischemic changes. Small remote lacunar infarct in the right cerebellum again noted. Vascular: Calcified atherosclerosis at the skullbase. No hyperdense vessel. Skull: Normal. Negative for fracture or focal lesion. Sinuses/Orbits: No acute finding. Other: None. IMPRESSION: No acute intracranial abnormality. Electronically Signed   By: Titus Dubin  M.D.   On: 11/22/2019 12:47   MR BRAIN WO CONTRAST  Result Date: 11/24/2019 CLINICAL DATA:  Focal neurological deficit. Stroke suspected. EXAM: MRI HEAD WITHOUT CONTRAST TECHNIQUE: Multiplanar, multiecho pulse sequences of the brain and surrounding structures were obtained without intravenous contrast. COMPARISON:  Head CT Nov 23, 2019 FINDINGS: Brain: Area of restricted diffusion involving the splenium of the corpus callosum on the left side, posterior aspect of the left fornix, lateral geniculate body, left occipital lobe and medial temporal lobe, including the body of the left hippocampus. Findings are consistent with a left PCA territory infarct. A single focus of restricted diffusion is also seen in the left corona radiata. Remote lacunar infarcts are seen in the right cerebellar hemisphere. Scattered foci of T2 hyperintensity are seen within the white matter of cerebral hemispheres and within the pons, nonspecific, most likely related to chronic small vessel ischemia. More pronounced than expected for age. Mild prominence of the ventricular system and  cerebellar sulci reflecting parenchymal volume loss. There is no hemorrhage, hydrocephalus, extra-axial collection or mass lesion. Vascular: Diminutive caliber of the horizontal petrous segment of the left ICA. Remainder of the major arterial flow voids are preserved. Skull and upper cervical spine: Normal marrow signal. Sinuses/Orbits: Left lens surgery. Other: None. IMPRESSION: 1. Acute left PCA territory infarct. 2. Punctate focus of restricted diffusion in the left corona radiata, consistent with acute infarct. 3. Diminutive caliber of the horizontal petrous segment of the left ICA. 4. 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 5. Moderate chronic small vessel ischemic changes. More pronounced than expected for age. These results were called by telephone at the time of interpretation on 11/24/2019 at 12:13 pm to provider [facility] , who verbally acknowledged these results. Electronically Signed   By: Pedro Earls M.D.   On: 11/24/2019 12:19   CT ABDOMEN PELVIS W CONTRAST  Result Date: 11/30/2019 CLINICAL DATA:  Right upper quadrant pain EXAM: CT ABDOMEN AND PELVIS WITH CONTRAST TECHNIQUE: Multidetector CT imaging of the abdomen and pelvis was performed using the standard protocol following bolus administration of intravenous contrast. CONTRAST:  132m OMNIPAQUE IOHEXOL 300 MG/ML  SOLN COMPARISON:  Abdominal ultrasound 11/30/2019 FINDINGS: LOWER CHEST: Intermediate sized bilateral pleural effusions with bibasilar atelectasis. HEPATOBILIARY: Small volume perihepatic ascites. Gallbladder is distended with surrounding inflammatory stranding. There is mild hyperenhancement of the gallbladder fossa. PANCREAS: Normal pancreas. No ductal dilatation or peripancreatic fluid collection. SPLEEN: Areas of hypoattenuation AP due to heterogeneous enhancement, but are more likely due to streak artifacts. ADRENALS/URINARY TRACT: The adrenal glands are normal. No hydronephrosis,  nephroureterolithiasis or solid renal mass. The urinary bladder is normal for degree of distention STOMACH/BOWEL: There is no hiatal hernia. Normal duodenal course and caliber. No small bowel dilatation or inflammation. No focal colonic abnormality. Status post appendectomy. 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 There is calcific aortic atherosclerosis. No abdominal or pelvic lymphadenopathy. REPRODUCTIVE: Enlarged prostate measures 5.5 cm in transverse dimension. MUSCULOSKELETAL. Multilevel degenerative disc disease and facet arthrosis. No bony spinal canal stenosis. OTHER: None. IMPRESSION: 1. Acute cholecystitis with distended, inflamed gallbladder and surrounding inflammatory stranding. 2. Intermediate sized pleural effusions and small volume perihepatic ascites. 3. Aortic Atherosclerosis (ICD10-I70.0). Electronically Signed   By: KUlyses JarredM.D.   On: 11/30/2019 22:39   [facility]  Result Date: 11/24/2019 CLINICAL DATA:  59year old male with stroke-like symptoms EXAM: BILATERAL CAROTID DUPLEX ULTRASOUND TECHNIQUE: GPearline Cablesscale imaging, color Doppler and duplex  ultrasound were performed of bilateral carotid and vertebral arteries in the neck. COMPARISON:  Prior duplex carotid ultrasound 11/06/2014 FINDINGS: Criteria: Quantification of carotid stenosis is based on velocity parameters that correlate the residual internal carotid diameter with NASCET-based stenosis levels, using the diameter of the distal internal carotid lumen as the denominator for stenosis measurement. The following velocity measurements were obtained: RIGHT ICA: 70/28 cm/sec CCA: 26/37 cm/sec SYSTOLIC ICA/CCA RATIO:  0.8 ECA:  142 cm/sec LEFT ICA: 53/23 cm/sec CCA: 858/85 cm/sec SYSTOLIC ICA/CCA RATIO:  0.5 ECA:  188 cm/sec RIGHT CAROTID ARTERY: Mild heterogeneous atherosclerotic plaque in the proximal internal carotid artery. By peak systolic velocity criteria,  the estimated stenosis is less than 50%. RIGHT VERTEBRAL ARTERY:  Patent with antegrade flow. LEFT CAROTID ARTERY: Mild heterogeneous atherosclerotic plaque in the proximal internal carotid artery. By peak systolic velocity criteria, the estimated stenosis is less than 50%. LEFT VERTEBRAL ARTERY:  Patent with antegrade flow. IMPRESSION: 1. Mild (1-49%) stenosis proximal right internal carotid artery secondary to mild heterogeneous atherosclerotic plaque. 2. Mild (1-49%) stenosis proximal left internal carotid artery secondary to mild heterogeneous atherosclerotic plaque. 3. The vertebral arteries are patent with normal antegrade flow. Signed, Criselda Peaches, MD, [facility] Electronically Signed   By: Jacqulynn Cadet M.D.   On: 11/24/2019 15:37   IR Perc Cholecystostomy  Result Date: 12/02/2019 INDICATION: 59 year old with acute cholecystitis. Patient has respiratory distress. Patient is not a surgical candidate at this time. EXAM: IMAGE GUIDED CHOLECYSTOSTOMY TUBE PLACEMENT MEDICATIONS: Moderate sedation ANESTHESIA/SEDATION: Moderate (conscious) sedation was employed during this procedure. A total of Versed 0.5 mg and Fentanyl 50 mcg was administered intravenously. Moderate Sedation Time: 15 minutes. The patient's level of consciousness and vital signs were monitored continuously by radiology nursing throughout the procedure under my direct supervision. FLUOROSCOPY TIME:  None COMPLICATIONS: None immediate. PROCEDURE: Informed consent was obtained for cholecystostomy tube placement. A time-out was performed prior to the procedure. Ultrasound was used to identify the gallbladder. The right abdomen was prepped and draped in sterile fashion. Maximal barrier sterile technique was utilized including caps, mask, sterile gowns, sterile gloves, sterile drape, hand hygiene and skin antiseptic. Skin was anesthetized with 1% lidocaine. Using ultrasound  guidance, an 18 gauge trocar needle was directed into the gallbladder from a transhepatic approach. Needle position confirmed within the gallbladder. Wire was advanced into the gallbladder under ultrasound guidance. The tract was dilated to accommodate a 10 [place] multipurpose drain. 115 mL black bilious fluid was removed. A sample of the fluid was sent for culture. Catheter was sutured to skin and attached to a gravity bag. Ultrasound images were taken and saved for this procedure. FINDINGS: Small amount of perihepatic ascites. 115 mL of dark bile was removed. Gallbladder was decompressed after drain placement. IMPRESSION: Successful placement of a percutaneous cholecystostomy tube with ultrasound guidance. Electronically Signed   By: Markus Daft M.D.   On: 12/02/2019 20:36   DG Chest Port 1 View  Result Date: Dec 04, 2019 CLINICAL DATA:  Acute respiratory failure EXAM: PORTABLE CHEST 1 VIEW COMPARISON:  12/02/2019 FINDINGS: Multifocal patchy opacities, with relative sparing of the right upper lobe, raising concern for multifocal infection. Asymmetric interstitial edema is technically possible but considered less likely. Possible small bilateral pleural effusions, although equivocal. This appearance is unchanged when compared to the recent prior. No pneumothorax. The heart is normal in size. Median sternotomy. IMPRESSION: Multifocal patchy opacities, favoring multifocal infection over interstitial edema, unchanged. Electronically Signed   By: Bertis Ruddy  [place] Pink M.D.   On: 12-18-2019 05:45   DG Chest Port 1 View  Result Date: 12/02/2019 CLINICAL DATA:  Acute stroke, tachycardia, leukocytosis, heart failure EXAM: PORTABLE CHEST 1 VIEW COMPARISON:  12/04/2019 FINDINGS: No significant change in AP portable examination with diffuse bilateral interstitial and heterogeneous pulmonary opacity, with possible small layering pleural effusions. No new or focal airspace opacity. Cardiomegaly status post median sternotomy  and CABG. IMPRESSION: No significant change in AP portable examination with diffuse bilateral interstitial and heterogeneous pulmonary opacity, with possible small layering pleural effusions, findings consistent with multifocal infection or edema. No new or focal airspace opacity. Electronically Signed   By: Eddie Candle M.D.   On: 12/02/2019 12:33   DG Chest Port 1 View  Result Date: 11/21/2019 CLINICAL DATA:  Acute respiratory distress. EXAM: PORTABLE CHEST 1 VIEW COMPARISON:  Nov 25, 2019 FINDINGS: There are worsening airspace opacities bilaterally. There is cardiomegaly. The patient is status post prior median sternotomy. There are small bilateral pleural effusions. There is no pneumothorax. The lung volumes are low. IMPRESSION: Cardiomegaly with worsening bilateral airspace opacities which may represent worsening congestive heart failure or multifocal pneumonia. Electronically Signed   By: Constance Holster M.D.   On: 12/08/2019 21:13   MR ABDOMEN MRCP W WO CONTAST  Result Date: 12/01/2019 CLINICAL DATA:  Cholecystitis, evaluate CBD EXAM: MRI ABDOMEN WITHOUT AND WITH CONTRAST (INCLUDING MRCP) TECHNIQUE: Multiplanar multisequence MR imaging of the abdomen was performed both before and after the administration of intravenous contrast. Heavily T2-weighted images of the biliary and pancreatic ducts were obtained, and three-dimensional MRCP images were rendered by post processing. CONTRAST:  77m GADAVIST GADOBUTROL 1 MMOL/ML IV SOLN COMPARISON:  CT abdomen pelvis, 11/30/2019 FINDINGS: Lower chest: Moderate bilateral pleural effusions and associated atelectasis or consolidation of the bilateral lung bases. Cardiomegaly. Hepatobiliary: No mass or other parenchymal abnormality identified. The gallbladder is distended and sludge filled, with gallbladder wall thickening and pericholecystic fluid. There is no biliary ductal dilatation. Central common bile duct is poorly visualized, likely due to a combination of  breath motion artifact and edema. Pancreas: No mass, inflammatory changes, or other parenchymal abnormality identified. Pancreatic duct is nondilated. Spleen:  Within normal limits in size and appearance. Adrenals/Urinary Tract: No masses identified. No evidence of hydronephrosis. Stomach/Bowel: Visualized portions within the abdomen are unremarkable. Vascular/Lymphatic: No pathologically enlarged lymph nodes identified. No abdominal aortic aneurysm demonstrated. Other:  Small volume perihepatic ascites. Musculoskeletal: No suspicious bone lesions identified. IMPRESSION: 1. Distended and sludge filled gallbladder with gallbladder wall thickening and pericholecystic fluid. Findings are consistent with acute cholecystitis and in keeping with prior CT. 2. No biliary ductal dilatation. Central common bile duct is poorly visualized, likely due to a combination of breath motion artifact and edema. No obvious obstructing calculus or other lesion. 3. Small volume perihepatic ascites. 4. Moderate bilateral pleural effusions and associated atelectasis or consolidation of the bilateral lung bases. Electronically Signed   By: AEddie CandleM.D.   On: 12/01/2019 08:11   ECHOCARDIOGRAM COMPLETE  Result Date: 11/24/2019    ECHOCARDIOGRAM REPORT   [facility] Date of Exam: 11/24/2019 Medical Rec #:  [id number]      Height:       67.0 in Accession #:    [id number]     Weight:       191.4 lb Date of Birth:  407-Sep-1962       BSA:          1.985  m Patient Age:    60 years        BP:           152/75 mmHg Patient Gender: M               HR:           67 bpm. Exam Location:  [facility] Procedure: 2D Echo, Cardiac Doppler and Color Doppler Indications:     Stroke 434.91  History:         Patient has prior history of Echocardiogram examinations, most                  recent 10/26/2019. TIA and COPD; Risk Factors:Diabetes and Sleep                  Apnea.  Sonographer:     Sherrie Sport RDCS (AE) Referring Phys:  [id number]  Suann Larry DANFORD Diagnosing Phys: Ida Rogue MD  Sonographer Comments: Suboptimal apical window. IMPRESSIONS  1. Left ventricular ejection fraction, by estimation, is 55 to 60%. The left ventricle has normal function. The left ventricle has no regional wall motion abnormalities. There is mild left ventricular hypertrophy. Left ventricular diastolic parameters are indeterminate.  2. Right ventricular systolic function is normal. The right ventricular size is normal. There is normal pulmonary artery systolic pressure.  3. Left atrial size was moderately dilated.  4. Mild mitral valve regurgitation.  5. Images suggestive of left pleural effusion FINDINGS  Left Ventricle: Left ventricular ejection fraction, by estimation, is 55 to 60%. The left ventricle has normal function. The left ventricle has no regional wall motion abnormalities. The left ventricular internal cavity size was normal in size. There is  mild left ventricular hypertrophy. Left ventricular diastolic parameters are indeterminate. Right Ventricle: The right ventricular size is normal. No increase in right ventricular wall thickness. Right ventricular systolic function is normal. There is normal pulmonary artery systolic pressure. The tricuspid regurgitant velocity is 2.44 m/s, and  with an assumed right atrial pressure of 10 mmHg, the estimated right ventricular systolic pressure is 65.5 mmHg. Left Atrium: Left atrial size was moderately dilated. Right Atrium: Right atrial size was normal in size. Pericardium: A small pericardial effusion is present. Mitral Valve: The mitral valve is normal in structure. Normal mobility of the mitral valve leaflets. Mild mitral valve regurgitation. No evidence of mitral valve stenosis. Tricuspid Valve: The tricuspid valve is normal in structure. Tricuspid valve regurgitation is not demonstrated. No evidence of tricuspid stenosis. Aortic Valve: The aortic valve is normal in structure. Aortic valve regurgitation  is not visualized. No aortic stenosis is present. Aortic valve mean gradient measures 5.7 mmHg. Aortic valve peak gradient measures 11.4 mmHg. Aortic valve area, by VTI measures 1.68 cm. Pulmonic Valve: The pulmonic valve was normal in structure. Pulmonic valve regurgitation is not visualized. No evidence of pulmonic stenosis. Aorta: The aortic root is normal in size and structure. Venous: The inferior vena cava is normal in size with greater than 50% respiratory variability, suggesting right atrial pressure of 3 mmHg. IAS/Shunts: No atrial level shunt detected by color flow Doppler.  LEFT VENTRICLE PLAX 2D LVIDd:         3.38 cm LVIDs:         2.28 cm LV PW:         1.19 cm LV IVS:        0.97 cm LVOT diam:     2.00 cm LV SV:  50 LV SV Index:   25 LVOT Area:     3.14 cm  RIGHT VENTRICLE RV Basal diam:  3.36 cm RV S prime:     10.00 cm/s TAPSE (M-mode): 3.2 cm LEFT ATRIUM             Index       RIGHT ATRIUM           Index LA diam:        5.30 cm 2.67 cm/m  RA Area:     21.10 cm LA Vol (A2C):   77.4 ml 39.00 ml/m RA Volume:   61.70 ml  31.09 ml/m LA Vol (A4C):   82.1 ml 41.37 ml/m LA Biplane Vol: 80.1 ml 40.36 ml/m  AORTIC VALVE                    PULMONIC VALVE AV Area (Vmax):    1.88 cm     PV Vmax:        0.69 m/s AV Area (Vmean):   1.84 cm     PV Peak grad:   1.9 mmHg AV Area (VTI):     1.68 cm     RVOT Peak grad: 2 mmHg AV Vmax:           168.67 cm/s AV Vmean:          111.333 cm/s AV VTI:            0.299 m AV Peak Grad:      11.4 mmHg AV Mean Grad:      5.7 mmHg LVOT Vmax:         101.00 cm/s LVOT Vmean:        65.200 cm/s LVOT VTI:          0.160 m LVOT/AV VTI ratio: 0.53  AORTA Ao Root diam: 2.90 cm MITRAL VALVE                TRICUSPID VALVE MV Area (PHT): 4.77 cm     TR Peak grad:   23.8 mmHg MV Decel Time: 159 msec     TR Vmax:        244.00 cm/s MV E velocity: 131.00 cm/s                             SHUNTS                             Systemic VTI:  0.16 m                              Systemic Diam: 2.00 cm Ida Rogue MD Electronically signed by Ida Rogue MD Signature Date/Time: 11/24/2019/2:50:13 PM    Final    ECHO TEE  Result Date: 11/21/2019    TRANSESOPHOGEAL ECHO REPORT   Patient Name:   [facility] LEE Lampe Date of Exam: 11/26/2019 Medical Rec #:  [id number]       Height:       67.0 in Accession #:    [id number]      Weight:       184.0 lb Date of Birth:  01-03-1961        BSA:          1.952 m Patient Age:    34 years        BP:  163/74 mmHg Patient Gender: M               HR:           85 bpm. Exam Location:  [facility] Procedure: Transesophageal Echo, Cardiac Doppler and Color Doppler Indications:     I63.9 Stroke  History:         Patient has prior history of Echocardiogram examinations, most                  recent 11/24/2019. CHF, TIA, PVD and COPD; Risk Factors:Sleep                  Apnea and Diabetes.  Sonographer:     Charmayne Sheer RDCS (AE) Referring Phys:  [id number] Arvil Chaco Diagnosing Phys: Kate Sable MD PROCEDURE: The transesophogeal probe was passed without difficulty through the esophogus of the patient. Sedation performed by performing physician. The patient developed no complications during the procedure. IMPRESSIONS  1. Left ventricular ejection fraction, by estimation, is 55 to 60%. The left ventricle has normal function. The left ventricle has no regional wall motion abnormalities.  2. Right ventricular systolic function is normal. The right ventricular size is normal.  3. Left atrial size was moderately dilated. No left atrial/left atrial appendage thrombus was detected.  4. Right atrial size was mildly dilated.  5. The mitral valve is normal in structure. Mild mitral valve regurgitation.  6. Aortic valve lambl's excrescence noted. The aortic valve is tricuspid. Aortic valve regurgitation is trivial. Conclusion(s)/Recommendation(s): No LA/LAA thrombus identified. No intracardiac source of embolism detected on this on this transesophageal  echocardiogram. FINDINGS  Left Ventricle: Left ventricular ejection fraction, by estimation, is 55 to 60%. The left ventricle has normal function. The left ventricle has no regional wall motion abnormalities. The left ventricular internal cavity size was normal in size. There is  no left ventricular hypertrophy. Right Ventricle: The right ventricular size is normal. No increase in right ventricular wall thickness. Right ventricular systolic function is normal. Left Atrium: Left atrial size was moderately dilated. No left atrial/left atrial appendage thrombus was detected. Right Atrium: Right atrial size was mildly dilated. Pericardium: There is no evidence of pericardial effusion. Mitral Valve: The mitral valve is normal in structure. Mild mitral valve regurgitation. Tricuspid Valve: The tricuspid valve is not well visualized. Tricuspid valve regurgitation is mild. Aortic Valve: Aortic valve lambl's excrescence noted. The aortic valve is tricuspid. Aortic valve regurgitation is trivial. Pulmonic Valve: The pulmonic valve was normal in structure. Pulmonic valve regurgitation is trivial. Aorta: The aortic root is normal in size and structure. Venous: The inferior vena cava was not well visualized. IAS/Shunts: No atrial level shunt detected by color flow Doppler. Kate Sable MD Electronically signed by Kate Sable MD Signature Date/Time: 12/02/2019/11:59:04 AM    Final    US Abdomen Limited RUQ  Result Date: 11/30/2019 CLINICAL DATA:  Pain. EXAM: ULTRASOUND ABDOMEN LIMITED RIGHT UPPER QUADRANT COMPARISON:  Nov 29, 2015 ultrasound. FINDINGS: Gallbladder: The gallbladder is distended. There is borderline gallbladder wall thickening. The sonographic Percell Miller sign is negative. There is gallbladder sludge. Common bile duct: Diameter: 3 mm Liver: The liver is nodular with a coarsened hepatic echotexture. Portal vein is patent on color Doppler imaging with normal direction of blood flow towards the liver. Other:  There is a trace amount of ascites. There is a right-sided pleural effusion. IMPRESSION: 1. Distended gallbladder sludge without definite sonographic evidence for acute cholecystitis. 2. Cirrhotic appearing liver. 3. Small volume abdominal ascites.  4. Incidentally noted right-sided pleural effusion. Electronically Signed   By: Constance Holster M.D.   On: 11/30/2019 19:02    Consults: Treatment Team:  Minna Merritts, MD Catarina Hartshorn, MD Olean Ree, MD Tyler Pita, MD Billey Co, MD   Subjective:    Overnight Issues: Unable to come off of BiPAP/AVAPS.  Less responsive this morning.  Overnight oxygen desaturations transiently responded to Lasix and single dose of Solu-Medrol.  Worsening respiratory status.  Required intubation.  Family apprised.  Objective:  Vital signs for last 24 hours: Temp:  [98.1 F (36.7 C)-99.3 F (37.4 C)] 99.3 F (37.4 C) (05/23 0416) Pulse Rate:  [28-131] 114 (05/23 0600) Resp:  [12-26] 26 (05/23 0600) BP: (102-166)/(68-96) 102/77 (05/23 0600) SpO2:  [86 %-98 %] 86 % (05/23 0600) FiO2 (%):  [98 %-100 %] 100 % (05/23 0957) Weight:  [81.4 kg] 81.4 kg (05/23 0421)  Hemodynamic parameters for last 24 hours:    Intake/Output from previous day: 05/22 0701 - 05/23 0700 In: 781.2 [I.V.:310.1; IV Piggyback:471.2] Out: 0258 [Urine:1500; Drains:70]  Intake/Output this shift: No intake/output data recorded.  Vent settings for last 24 hours: Vent Mode: PRVC FiO2 (%):  [98 %-100 %] 100 % Set Rate:  [28 bmp] 28 bmp Vt Set:  [400 mL] 400 mL PEEP:  [12 cmH20] 12 cmH20 Plateau Pressure:  [26 cmH20] 26 cmH20  Physical Exam:  GENERAL: Acute on chronically ill-appearing male on BiPAP/AVAPS, poorly responsive stares blankly at examiner.  Very pale.  Looks markedly older than stated age required intubation this morning. HEAD: Normocephalic, atraumatic.  EYES: Pupils equal, round, reactive to [place].  Mild scleral icterus noted. MOUTH: Oral  mucosa moist, very poor dentition, at time of intubation blood pooled at the posterior pharynx. NECK: Supple. No thyromegaly. Trachea midline. No JVD.  No adenopathy. PULMONARY: Rhonchi anteriorly right greater than left, dry crackles at the bases.  No wheezes. CARDIOVASCULAR: Irregular rate and rhythm, tachycardic, frequent PACs, occasional bursts of SVT with aberrancy.  No murmurs.  Mid sternotomy dressing in place wound clean. GASTROINTESTINAL: Abdomen nondistended, cholecystectomy drain in the right upper quadrant laterally. Bowel sounds present but diminished.  No tenderness elicited, patient poorly responsive however. MUSCULOSKELETAL: No joint deformity, no clubbing, no edema.  Pale fingertips with poor capillary refill. NEUROLOGIC: Poorly responsive, stares at examiner but no other interaction.  No overt focal deficit. SKIN: CABG wounds healing, cool,dry.  Pale. PSYCH: Unable to assess due to critical illness.  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 with median sternotomy Required intubation now mechanically ventilated ARDS protocol Vent bundle/VAP bundle Titrate FiO2/PEEP for saturations of 90% or better Supportive care Poor overall prognosis due to multiple comorbidities  Bilateral interstitial pulmonary opacities DDx: ARDS, acute phase amiodarone toxicity, less likely CHF Patient set up for amiodarone toxicity Current picture consistent with ARDS Inflammatory markers pending Intubation, mechanical ventilation with ARDS protocol Titrate FiO2/PEEP for saturations of 90% or better Guarded prognosis   Atrial fibrillation with rapid ventricular response Difficult to control even with amiodarone previously Combined systolic and diastolic heart failure Beta-blockers for rate control Consider esmolol infusion Amiodarone discontinued due to  potential toxicity, lack of efficacy Heparin IV Discussed with cardiology, Dr. Garen Lah  Acute cholecystitis Severe sepsis Multiorgan failure Status post cholecystectomy drain Continue antibiotics as per ID (piperacillin tazobactam) Biliary fluid culture pending Multiorgan failure worsening  Encephalopathy Left PCA stroke, sub-acute  Acute, toxic metabolic encephalopathy Supportive care Part of his multiorgan dysfunction  Acute kidney injury Due to multiorgan failure as above Volume challenge Avoid nephrotoxins Supportive care  Diabetes mellitus type 2 Endorgan damage from diabetes mellitus ICU hyperglycemia protocol This issue adds complexity to his management.   Pressure Injury 10/30/19 Nose Deep Tissue Pressure Injury - Purple or maroon localized area of discolored intact skin or blood-filled blister due to damage of underlying soft tissue from pressure and/or shear. DTI from Bipap mask on bridge of nose (Active)  10/30/19 0800  Location: Nose  Location Orientation:   Staging: Deep Tissue Pressure Injury - Purple or maroon localized area of discolored intact skin or blood-filled blister due to damage of underlying soft tissue from pressure and/or shear.  Wound Description (Comments): DTI from Bipap mask on bridge of nose  Present on Admission: No    LOS: 10 days   Additional comments: Discussed with patient's Sister Maudie Mercury and brother [name].  Apprised of severity of illness.  Overall patient with multiorgan failure, to include respiratory failure acute kidney injury, acute on chronic systolic and diastolic heart failure, hepatic dysfunction, stroke and encephalopathy now with severe respiratory failure acute on chronic.  Patient is high risk for further deterioration and death.  Critical Care Total Time*: [place], MD [place] [facility] 12-31-19  *Care during the described time interval was provided by me and/or other providers on the critical care  team.  I have reviewed this patient's available data, including medical history, events of note, physical examination and test results as part of my evaluation.  **This note was dictated using voice recognition software/Dragon.  Despite best efforts to proofread, errors can occur which can change the meaning.  Any change was purely unintentional.

## 2019-12-12 NOTE — Procedures (Signed)
Endotracheal Intubation: Patient required placement of an artificial airway secondary to acute on chronic respiratory failure. Consent: Emergent.   Hand washing performed prior to starting the procedure.   Medications administered for sedation prior to procedure:  Midazolam 2 mg IV, rocuronium 60 mg IV, Fentanyl 100 mcg IV, etomidate 20 mg IV.   A time out procedure was called and correct patient, name, & ID confirmed. Needed supplies and equipment were assembled and checked to include ETT, 10 ml syringe, Glidescope, Mac and Miller blades, suction, oxygen and bag mask valve, end tidal CO2 monitor.   Patient was positioned to align the mouth and pharynx to facilitate visualization of the glottis.   Heart rate, SpO2 and blood pressure was continuously monitored during the procedure. Pre-oxygenation was conducted prior to intubation and endotracheal tube was placed through the vocal cords into the trachea.  However patient was not responding to preoxygenation and saturations remained in the 80s despite bagging with Peep valve.  It was decided to place the airway emergently.  The posterior pharynx showed pooled blood.  The cords were easily visible.The artificial airway was placed under direct visualization using glidescope #4 blade via the oral route using a #8 Shiley ETT on the first attempt.  Cricoid pressure was utilized.  No evidence of aspiration.  ETT was secured at 24 cm mark.  Placement was confirmed by auscuitation of lungs with good breath sounds bilaterally and no epigastric sounds.  Condensation was noted on endotracheal tube.  Pulse ox 96% after recruitment.  CO2 detector in place with appropriate color change.   Complications: None .   Operator: Patsey Berthold   Chest radiograph ordered and pending.   Comments: OGT placed via glidescope.   Renold Don, MD  PCCM  *This note was dictated using voice recognition software/Dragon.  Despite best efforts to proofread,  errors can occur which can change the meaning.  Any change was purely unintentional.

## 2019-12-12 NOTE — Procedures (Signed)
   UROLOGY PROCEDURE NOTE  Indication: Critically ill, nursing unable to place foley  The patient was prepped and draped in standard sterile fashion.  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 The urojet was used to gently dilate the meatus.  An 5F coude catheter passed easily into the bladder with return of clear yellow urine.  10 cc were placed in the balloon, the catheter was connected to dependent drainage, and the Foley was secured to the thigh. The foreskin was reduced.  Plan: Foley duration per primary team  Nickolas Madrid, MD 2019/12/27

## 2019-12-12 NOTE — Progress Notes (Signed)
Hinton Dyer, NP notified of patients oxygen saturation dropping below 92%. FIO2 increased to 100% and new order to maintain O2 sats >88%.

## 2019-12-12 NOTE — Progress Notes (Signed)
Patient taken off BiPap and intubated. Patient lethargic, drowsy, not following commands. PO meds held per Dr. Patsey Berthold. Will continue to monitor.

## 2019-12-12 DEATH — deceased

## 2020-05-09 IMAGING — DX DG CHEST 2V
2 series · 2 of 2 positions shown · non-contrast
Comparison: November 04, 2019

CLINICAL DATA: Status post recent coronary artery bypass grafting

EXAM:
CHEST - 2 VIEW

[view not recorded]
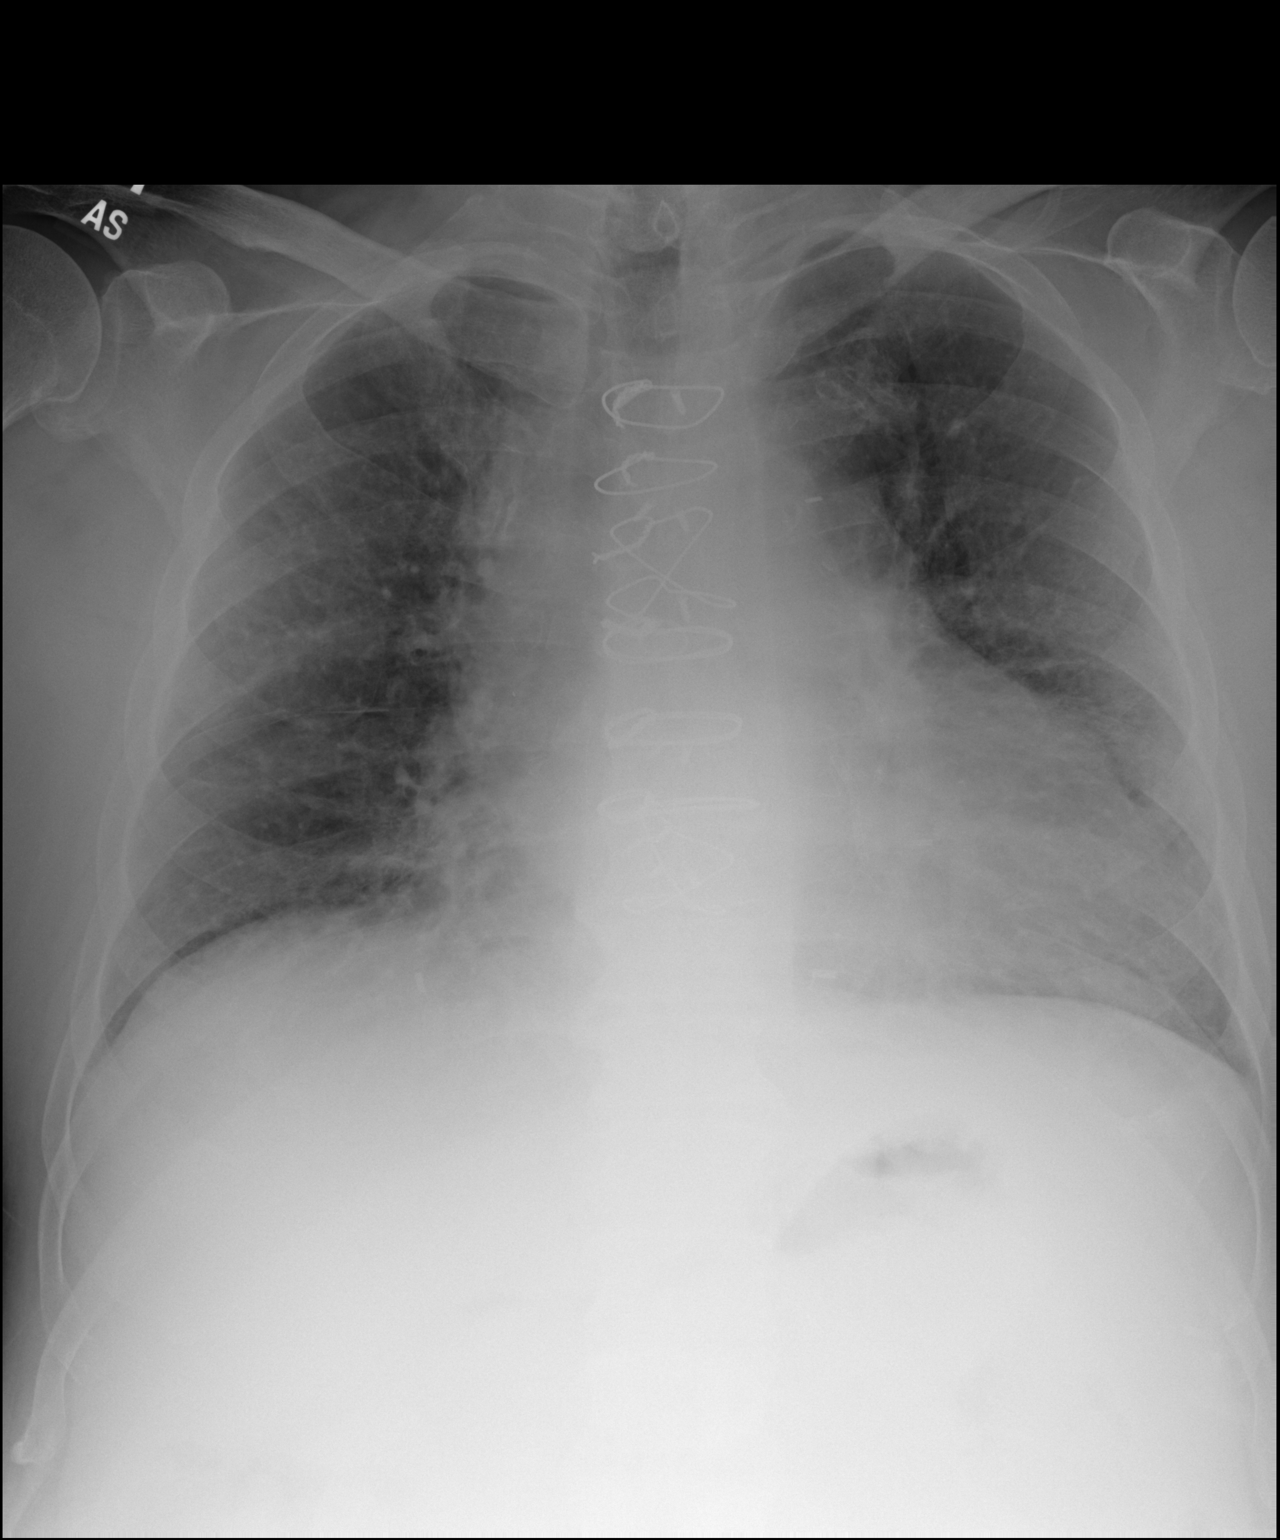

[dg chest 2 view]
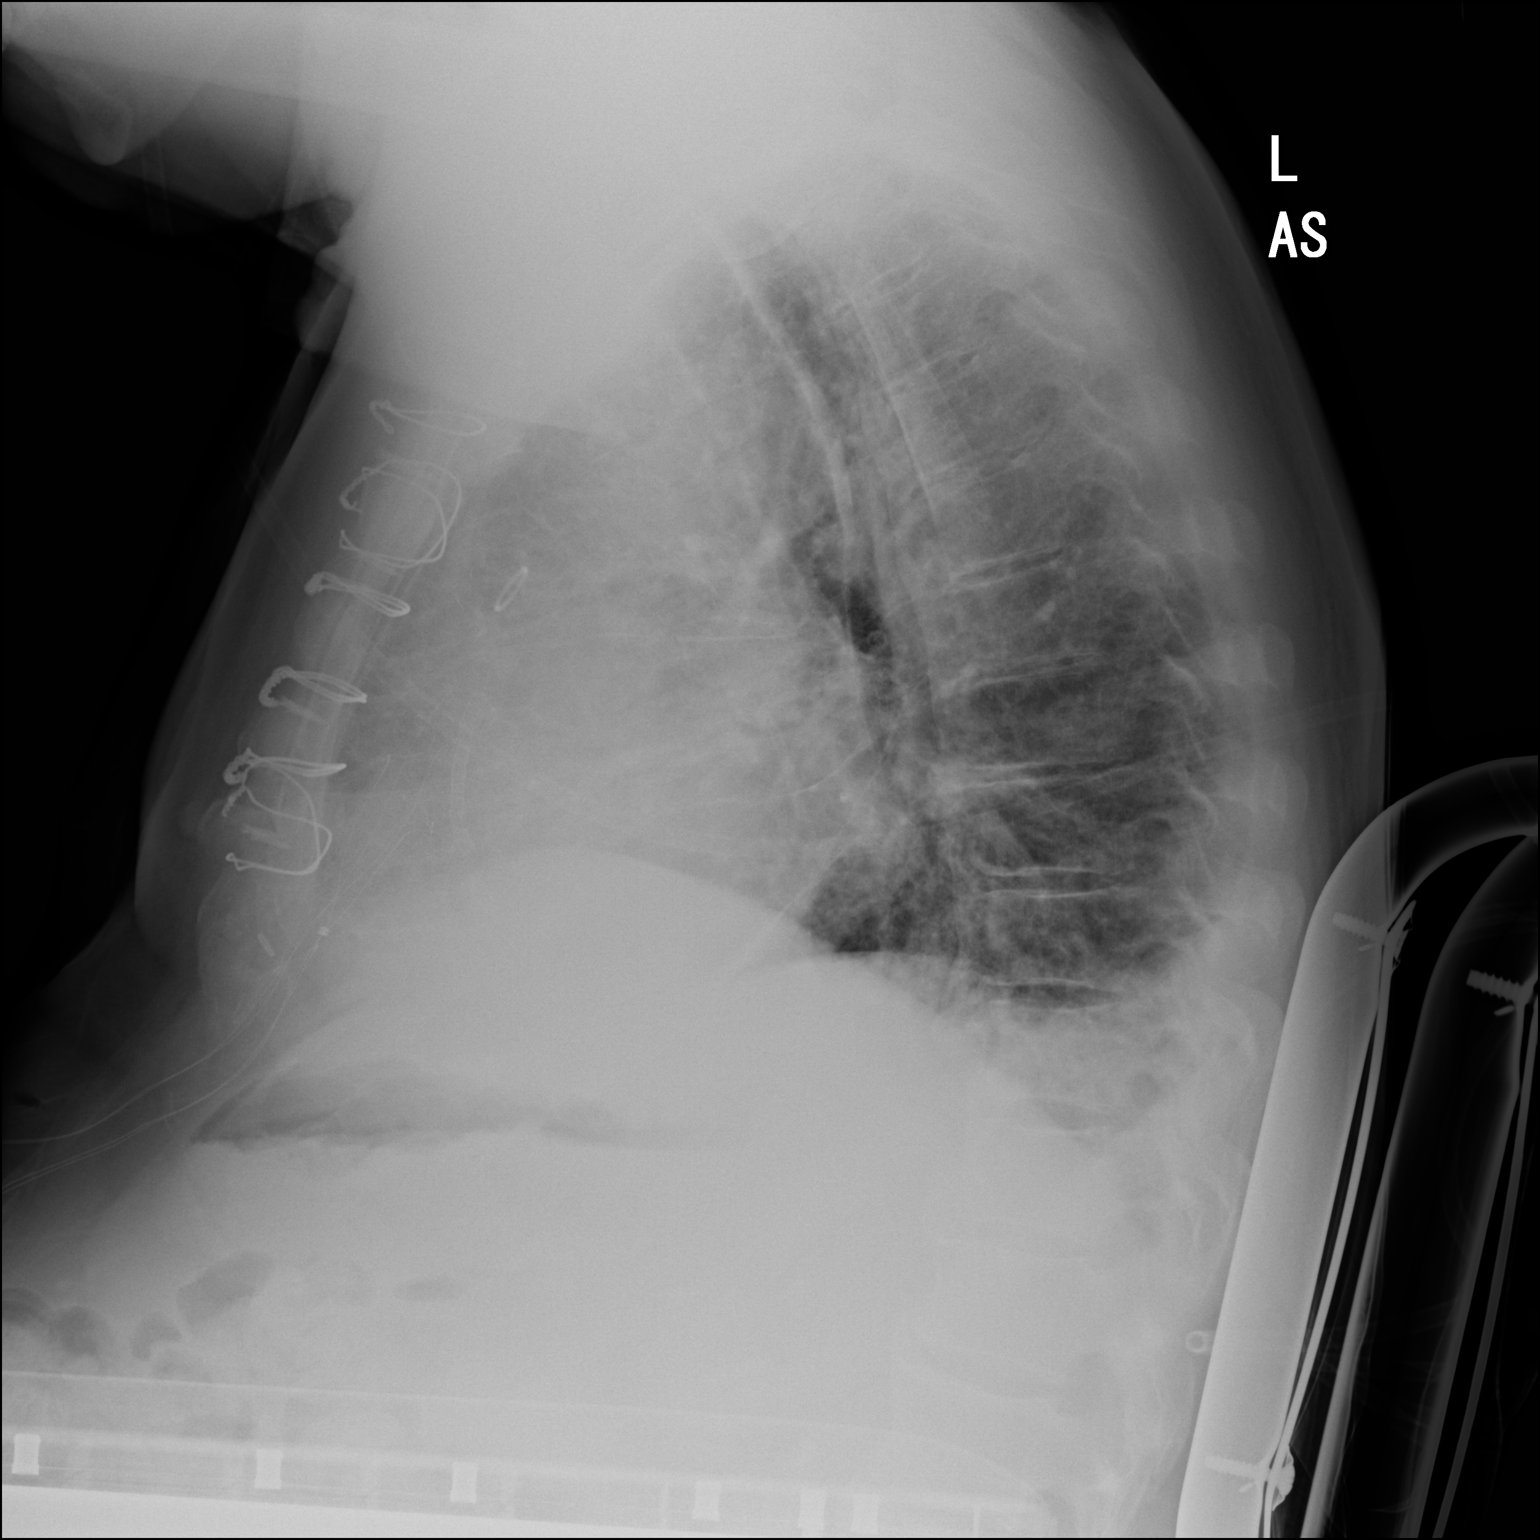

[2 of 2 positions shown; findings below may reference images not displayed]

FINDINGS: There is no appreciable edema or airspace opacity. There is
cardiomegaly with pulmonary vascularity within normal limits.
Patient is status post coronary artery bypass grafting. No
adenopathy. There is degenerative change in the thoracic spine.
IMPRESSION: Cardiomegaly. Status post coronary artery bypass grafting. No edema
or airspace opacity.

## 2020-05-27 IMAGING — MR MR ABDOMEN WO/W CM MRCP
18 of 21 series · 43 of 48 positions shown · IV contrast (gadavist)
Comparison: CT abdomen pelvis, 11/30/2019

CLINICAL DATA: Cholecystitis, evaluate CBD

EXAM:
MRI ABDOMEN WITHOUT AND WITH CONTRAST (INCLUDING MRCP)
TECHNIQUE: Multiplanar multisequence MR imaging of the abdomen was performed
both before and after the administration of intravenous contrast.
Heavily T2-weighted images of the biliary and pancreatic ducts were
obtained, and three-dimensional MRCP images were rendered by post
processing.
CONTRAST:  8mL GADAVIST GADOBUTROL 1 MMOL/ML IV SOLN

[Series 3: T2 · coronal · 6.0mm · 1.19mm/px · 1 of 32 slices shown (1 of 2)]
[im 1/32]
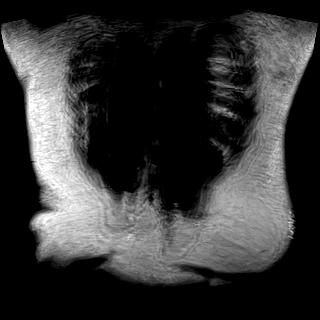

[Series 4: T2 · axial · 6.0mm · 1.19mm/px · 1 of 32 slices shown (2 of 2)]
[im 1/32]
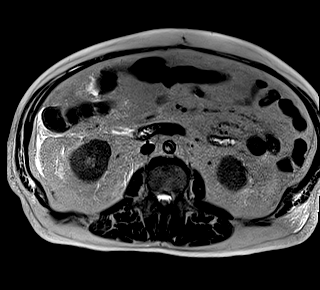

[Series 5: T1 · axial · 6.0mm · 0.74mm/px · z∈[-40,+182]mm · 3 of 64 slices shown]
[im 1/64]
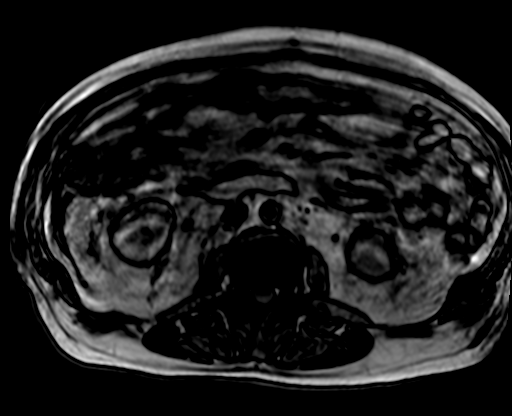
[im 32/64]
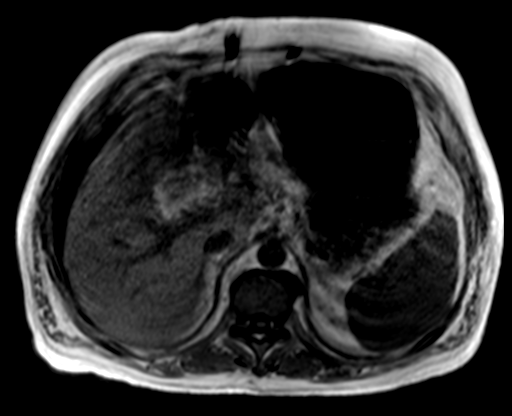
[im 64/64]
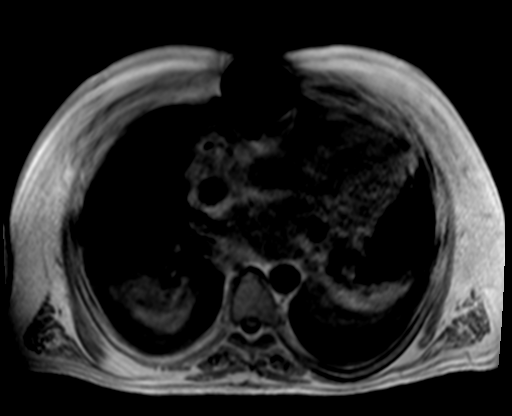

[Series 8: T2 fat-sat · axial · 6.0mm · 1.19mm/px · 1 of 30 slices shown]
[im 1/30]
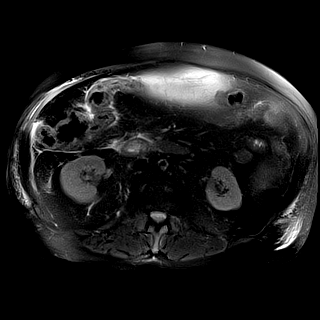

[Series 9: ax dwi_tracew · axial · 6.0mm · 1.42mm/px · z∈[-34,+174]mm · 4 of 90 slices shown]
[im 1/90]
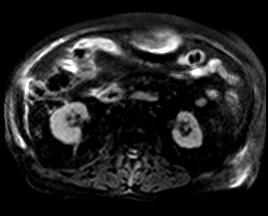
[im 30/90]
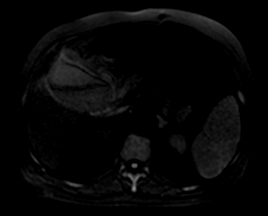
[im 60/90]
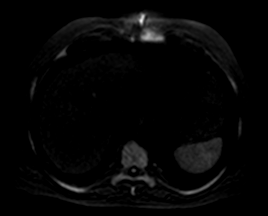
[im 90/90]
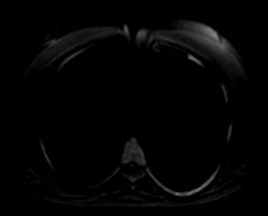

[Series 10: ax dwi_adc · axial · 6.0mm · 1.42mm/px · 1 of 30 slices shown]
[im 1/30]
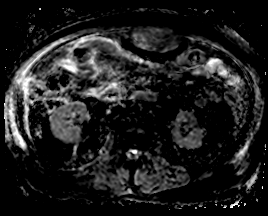

[Series 14: MRCP · coronal · 3.0mm · 1.12mm/px · 1 of 17 slices shown]
[im 1/17]
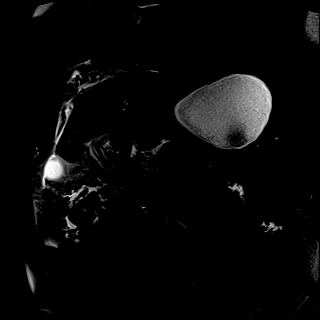

[Series 15: radials · oblique · 50.0mm · 0.78mm/px · 1 of 5 slices shown]
[im 1/5]
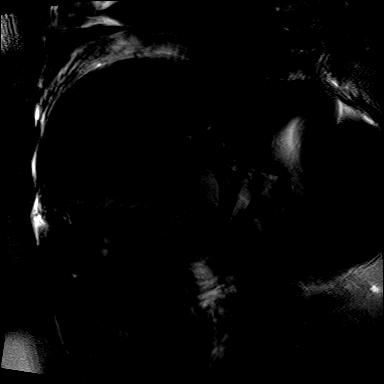

[Series 16: T1 dynamic fat-sat · axial · non-contrast · 3.0mm · 1.19mm/px · z∈[-56,+181]mm · 3 of 80 slices shown (1 of 5)]
[im 1/80]
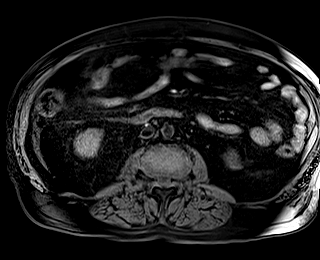
[im 40/80]
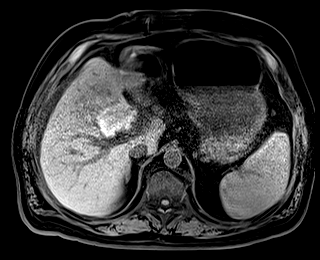
[im 80/80]
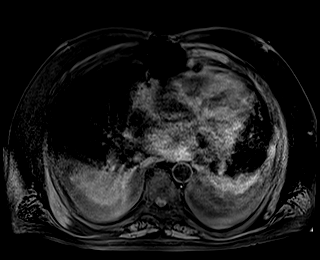

[Series 17: T1 dynamic fat-sat post-contrast · axial · 3.0mm · 1.19mm/px · z∈[-56,+181]mm · 3 of 80 slices shown (1 of 4)]
[im 1/80]
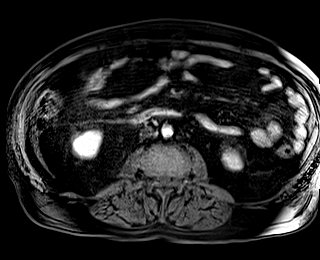
[im 40/80]
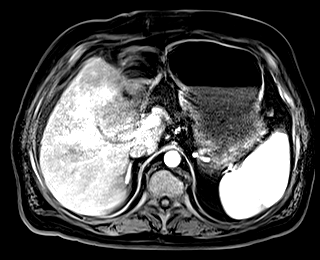
[im 80/80]
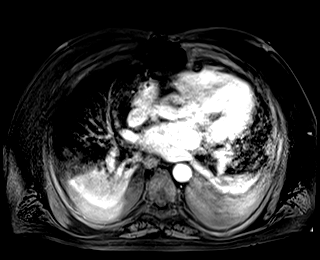

[Series 18: T1 dynamic fat-sat · axial · 3.0mm · 1.19mm/px · z∈[-56,+181]mm · 3 of 80 slices shown (2 of 5)]
[im 1/80]
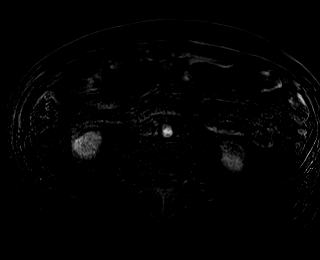
[im 40/80]
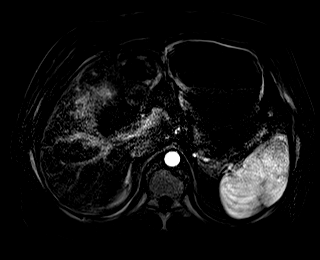
[im 80/80]
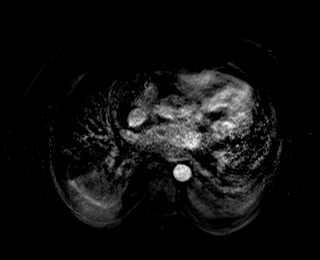

[Series 19: T1 dynamic fat-sat post-contrast · axial · 3.0mm · 1.19mm/px · z∈[-56,+181]mm · 3 of 80 slices shown (2 of 4)]
[im 1/80]
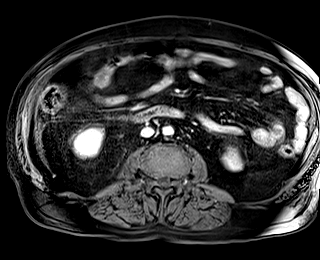
[im 40/80]
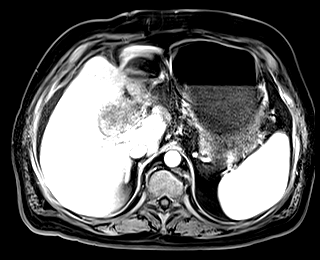
[im 80/80]
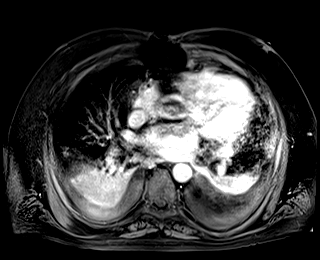

[Series 20: T1 dynamic fat-sat · axial · 3.0mm · 1.19mm/px · z∈[-56,+181]mm · 3 of 80 slices shown (3 of 5)]
[im 1/80]
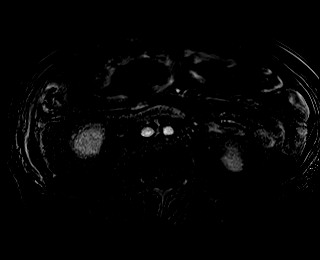
[im 40/80]
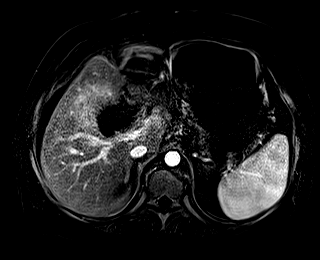
[im 80/80]
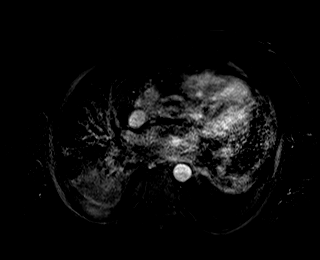

[Series 21: T1 dynamic fat-sat post-contrast · axial · 3.0mm · 1.19mm/px · z∈[-56,+181]mm · 3 of 80 slices shown (3 of 4)]
[im 1/80]
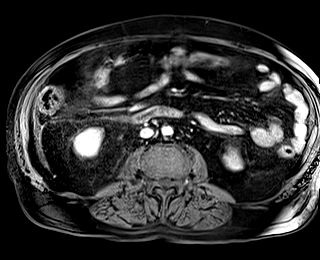
[im 40/80]
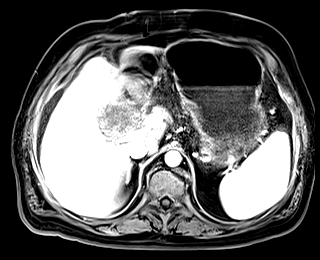
[im 80/80]
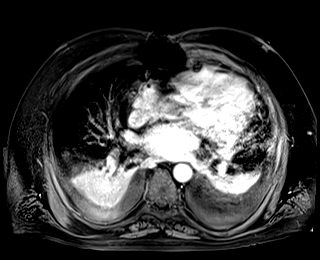

[Series 22: T1 dynamic fat-sat · axial · 3.0mm · 1.19mm/px · z∈[-56,+181]mm · 3 of 80 slices shown (4 of 5)]
[im 1/80]
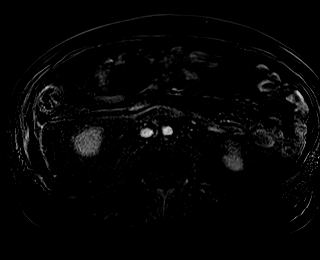
[im 40/80]
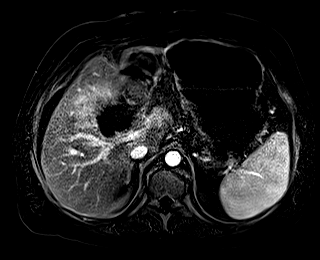
[im 80/80]
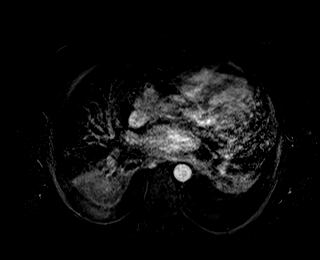

[Series 23: T1 dynamic post-contrast · coronal · 3.0mm · 1.31mm/px · 3 of 80 slices shown]
[im 1/80]
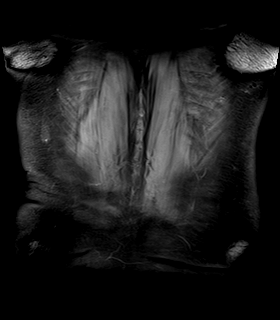
[im 40/80]
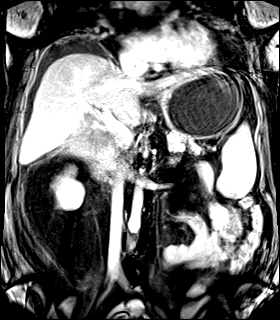
[im 80/80]
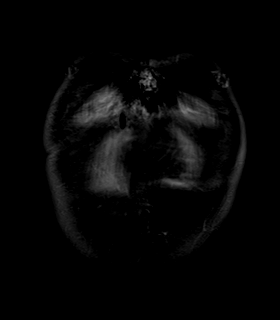

[Series 24: T1 dynamic fat-sat post-contrast · axial · 3.0mm · 1.19mm/px · z∈[-56,+181]mm · 3 of 80 slices shown (4 of 4)]
[im 1/80]
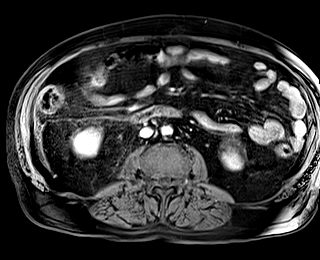
[im 40/80]
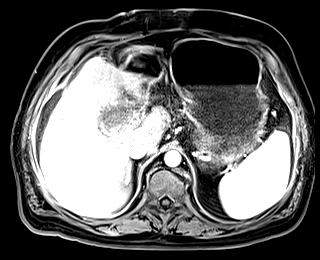
[im 80/80]
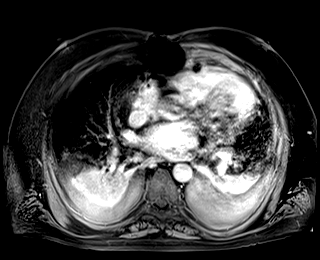

[Series 25: T1 dynamic fat-sat · axial · 3.0mm · 1.19mm/px · z∈[-56,+181]mm · 3 of 80 slices shown (5 of 5)]
[im 1/80]
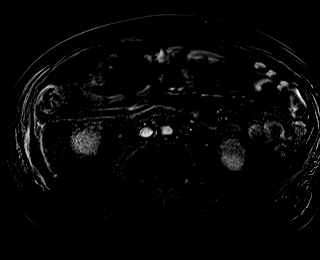
[im 40/80]
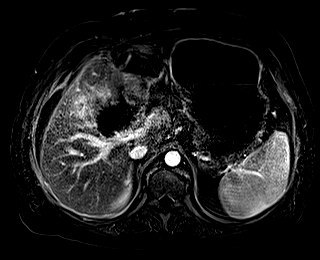
[im 80/80]
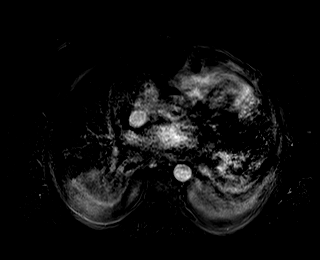

[43 of 48 positions shown; findings below may reference images not displayed]

FINDINGS: Lower chest: Moderate bilateral pleural effusions and associated
atelectasis or consolidation of the bilateral lung bases.
Cardiomegaly.

Hepatobiliary: No mass or other parenchymal abnormality identified.
The gallbladder is distended and sludge filled, with gallbladder
wall thickening and pericholecystic fluid. There is no biliary
ductal dilatation. Central common bile duct is poorly visualized,
likely due to a combination of breath motion artifact and edema.

Pancreas: No mass, inflammatory changes, or other parenchymal
abnormality identified. Pancreatic duct is nondilated.

Spleen:  Within normal limits in size and appearance.

Adrenals/Urinary Tract: No masses identified. No evidence of
hydronephrosis.

Stomach/Bowel: Visualized portions within the abdomen are
unremarkable.

Vascular/Lymphatic: No pathologically enlarged lymph nodes
identified. No abdominal aortic aneurysm demonstrated.

Other:  Small volume perihepatic ascites.

Musculoskeletal: No suspicious bone lesions identified.
IMPRESSION: 1. Distended and sludge filled gallbladder with gallbladder wall
thickening and pericholecystic fluid. Findings are consistent with
acute cholecystitis and in keeping with prior CT.
2. No biliary ductal dilatation. Central common bile duct is poorly
visualized, likely due to a combination of breath motion artifact
and edema. No obvious obstructing calculus or other lesion.
3. Small volume perihepatic ascites.
4. Moderate bilateral pleural effusions and associated atelectasis
or consolidation of the bilateral lung bases.

## 2020-05-29 IMAGING — DX DG CHEST 1V PORT
1 series · 1 of 1 positions shown · non-contrast
Comparison: Film from earlier in the same day.

CLINICAL DATA: Check central line placement

EXAM:
PORTABLE CHEST 1 VIEW

[chest ap]
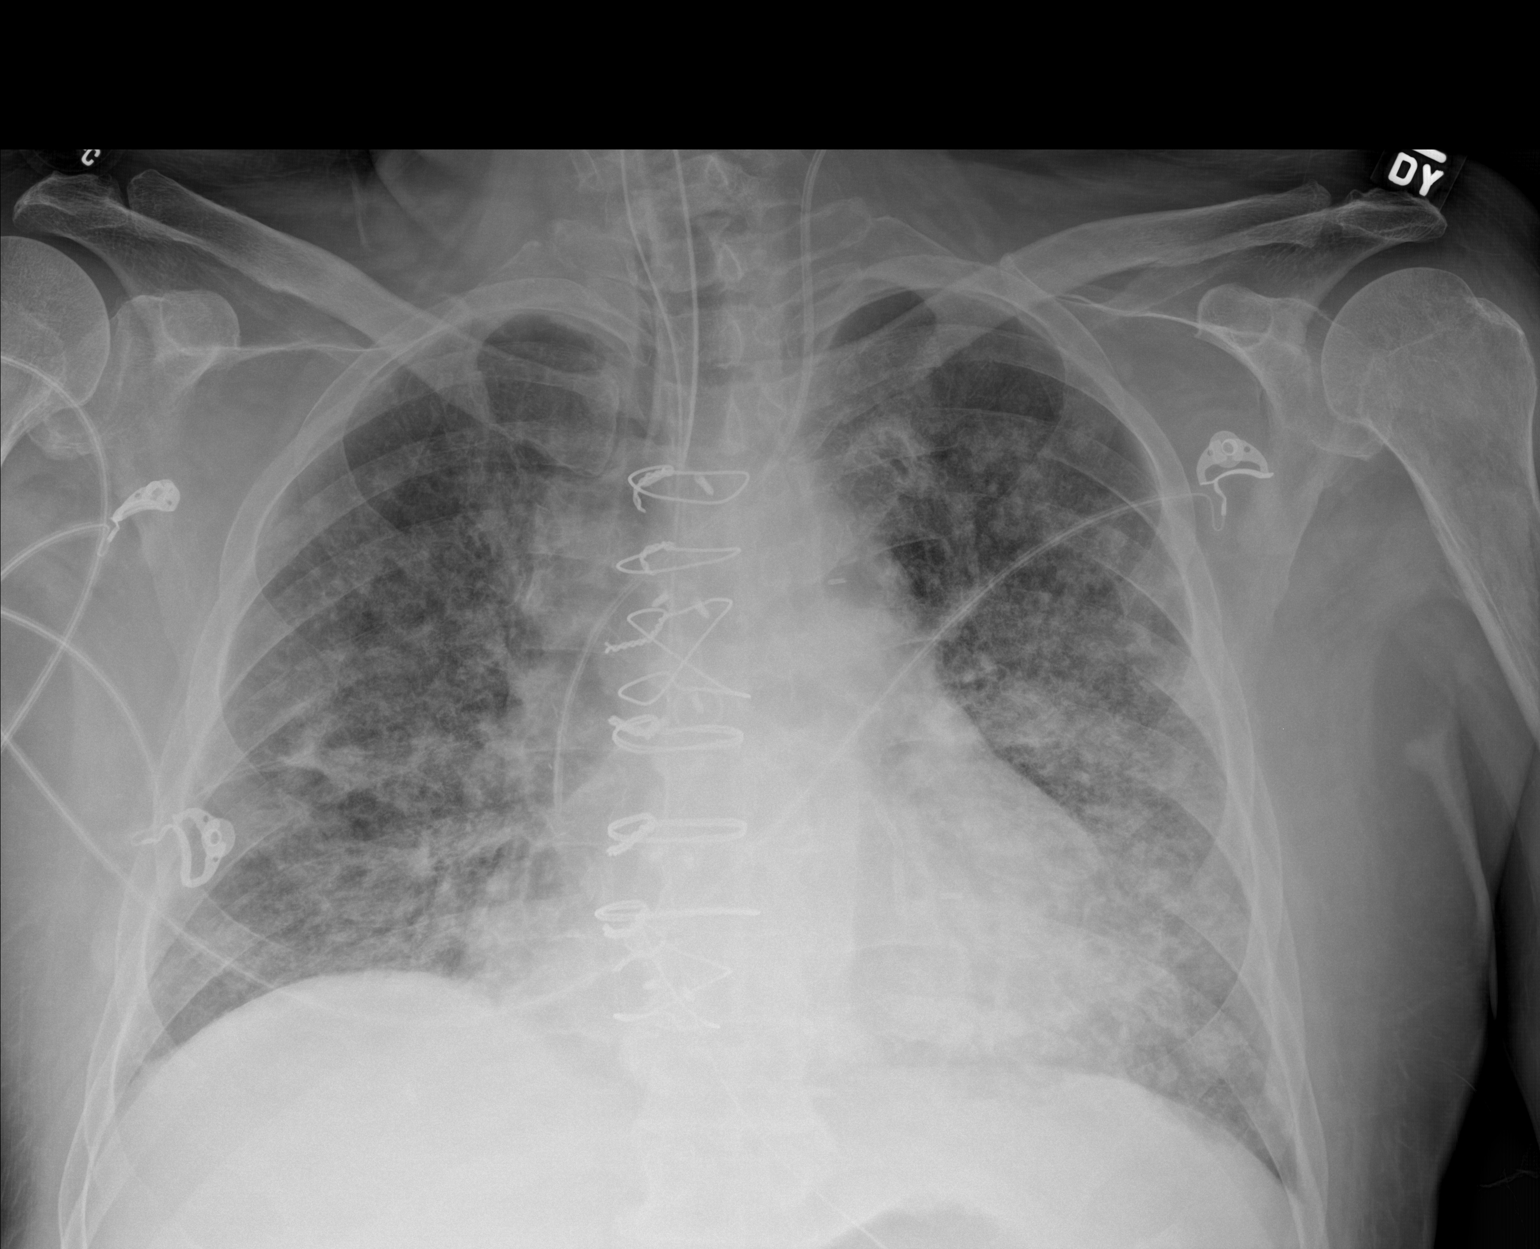

[1 of 1 positions shown; findings below may reference images not displayed]

FINDINGS: Endotracheal tube and gastric catheter are again seen and stable.
New left jugular central line is noted at the cavoatrial junction.
No pneumothorax is noted. Diffuse bilateral airspace opacities are
again identified stable in appearance from the prior exam. Cardiac
shadow is stable as are postsurgical changes.
IMPRESSION: No pneumothorax following central line placement. The remainder of
the study is stable from the earlier film.
